# Patient Record
Sex: Male | Born: 1940 | Race: White | Hispanic: No | Marital: Married | Smoking: Former smoker
Health system: Southern US, Community
[De-identification: ages and names within clinical notes are randomized; demographics above are authoritative.]

## PROBLEM LIST (undated history)

## (undated) DIAGNOSIS — M1990 Unspecified osteoarthritis, unspecified site: Secondary | ICD-10-CM

## (undated) DIAGNOSIS — C159 Malignant neoplasm of esophagus, unspecified: Secondary | ICD-10-CM

## (undated) DIAGNOSIS — G459 Transient cerebral ischemic attack, unspecified: Secondary | ICD-10-CM

## (undated) DIAGNOSIS — I77811 Abdominal aortic ectasia: Secondary | ICD-10-CM

## (undated) DIAGNOSIS — I10 Essential (primary) hypertension: Secondary | ICD-10-CM

## (undated) DIAGNOSIS — I771 Stricture of artery: Secondary | ICD-10-CM

## (undated) DIAGNOSIS — I509 Heart failure, unspecified: Secondary | ICD-10-CM

## (undated) DIAGNOSIS — H9190 Unspecified hearing loss, unspecified ear: Secondary | ICD-10-CM

## (undated) DIAGNOSIS — I639 Cerebral infarction, unspecified: Secondary | ICD-10-CM

## (undated) DIAGNOSIS — E119 Type 2 diabetes mellitus without complications: Secondary | ICD-10-CM

## (undated) DIAGNOSIS — E039 Hypothyroidism, unspecified: Secondary | ICD-10-CM

## (undated) DIAGNOSIS — K922 Gastrointestinal hemorrhage, unspecified: Secondary | ICD-10-CM

## (undated) DIAGNOSIS — I340 Nonrheumatic mitral (valve) insufficiency: Secondary | ICD-10-CM

## (undated) DIAGNOSIS — R160 Hepatomegaly, not elsewhere classified: Secondary | ICD-10-CM

## (undated) DIAGNOSIS — D649 Anemia, unspecified: Secondary | ICD-10-CM

## (undated) DIAGNOSIS — M5134 Other intervertebral disc degeneration, thoracic region: Secondary | ICD-10-CM

## (undated) DIAGNOSIS — G4730 Sleep apnea, unspecified: Secondary | ICD-10-CM

## (undated) DIAGNOSIS — I517 Cardiomegaly: Secondary | ICD-10-CM

## (undated) HISTORY — DX: Cardiomegaly: I51.7

## (undated) HISTORY — PX: APPENDECTOMY: SHX54

## (undated) HISTORY — DX: Nonrheumatic mitral (valve) insufficiency: I34.0

## (undated) HISTORY — PX: FRACTURE SURGERY: SHX138

## (undated) HISTORY — DX: Anemia, unspecified: D64.9

## (undated) HISTORY — DX: Other intervertebral disc degeneration, thoracic region: M51.34

## (undated) HISTORY — PX: NOSE SURGERY: SHX723

## (undated) HISTORY — DX: Gastrointestinal hemorrhage, unspecified: K92.2

## (undated) HISTORY — DX: Malignant neoplasm of esophagus, unspecified: C15.9

## (undated) HISTORY — DX: Abdominal aortic ectasia: I77.811

## (undated) HISTORY — PX: EYE SURGERY: SHX253

## (undated) HISTORY — DX: Stricture of artery: I77.1

---

## 2010-03-15 ENCOUNTER — Ambulatory Visit: Payer: Self-pay | Admitting: Family Medicine

## 2011-05-02 DIAGNOSIS — E785 Hyperlipidemia, unspecified: Secondary | ICD-10-CM | POA: Diagnosis not present

## 2011-05-02 DIAGNOSIS — E079 Disorder of thyroid, unspecified: Secondary | ICD-10-CM | POA: Diagnosis not present

## 2011-05-02 DIAGNOSIS — E119 Type 2 diabetes mellitus without complications: Secondary | ICD-10-CM | POA: Diagnosis not present

## 2011-05-02 DIAGNOSIS — I10 Essential (primary) hypertension: Secondary | ICD-10-CM | POA: Diagnosis not present

## 2011-05-02 DIAGNOSIS — Z79899 Other long term (current) drug therapy: Secondary | ICD-10-CM | POA: Diagnosis not present

## 2011-05-02 DIAGNOSIS — Z23 Encounter for immunization: Secondary | ICD-10-CM | POA: Diagnosis not present

## 2011-05-16 DIAGNOSIS — Z1211 Encounter for screening for malignant neoplasm of colon: Secondary | ICD-10-CM | POA: Diagnosis not present

## 2011-06-29 DIAGNOSIS — H53469 Homonymous bilateral field defects, unspecified side: Secondary | ICD-10-CM | POA: Diagnosis not present

## 2011-06-29 DIAGNOSIS — H2510 Age-related nuclear cataract, unspecified eye: Secondary | ICD-10-CM | POA: Diagnosis not present

## 2011-06-29 DIAGNOSIS — Z961 Presence of intraocular lens: Secondary | ICD-10-CM | POA: Diagnosis not present

## 2011-06-29 DIAGNOSIS — E119 Type 2 diabetes mellitus without complications: Secondary | ICD-10-CM | POA: Diagnosis not present

## 2011-08-02 DIAGNOSIS — E039 Hypothyroidism, unspecified: Secondary | ICD-10-CM | POA: Diagnosis not present

## 2011-08-02 DIAGNOSIS — G4733 Obstructive sleep apnea (adult) (pediatric): Secondary | ICD-10-CM | POA: Diagnosis not present

## 2011-11-21 DIAGNOSIS — E039 Hypothyroidism, unspecified: Secondary | ICD-10-CM | POA: Diagnosis not present

## 2011-12-14 ENCOUNTER — Emergency Department: Payer: Self-pay | Admitting: Emergency Medicine

## 2012-02-29 DIAGNOSIS — Z23 Encounter for immunization: Secondary | ICD-10-CM | POA: Diagnosis not present

## 2012-03-25 DIAGNOSIS — E039 Hypothyroidism, unspecified: Secondary | ICD-10-CM | POA: Diagnosis not present

## 2012-03-25 DIAGNOSIS — I10 Essential (primary) hypertension: Secondary | ICD-10-CM | POA: Diagnosis not present

## 2012-04-02 DIAGNOSIS — E119 Type 2 diabetes mellitus without complications: Secondary | ICD-10-CM | POA: Diagnosis not present

## 2012-04-02 DIAGNOSIS — M79609 Pain in unspecified limb: Secondary | ICD-10-CM | POA: Diagnosis not present

## 2012-04-02 DIAGNOSIS — B351 Tinea unguium: Secondary | ICD-10-CM | POA: Diagnosis not present

## 2012-07-01 DIAGNOSIS — E119 Type 2 diabetes mellitus without complications: Secondary | ICD-10-CM | POA: Diagnosis not present

## 2012-07-31 DIAGNOSIS — Z961 Presence of intraocular lens: Secondary | ICD-10-CM | POA: Diagnosis not present

## 2012-07-31 DIAGNOSIS — H53469 Homonymous bilateral field defects, unspecified side: Secondary | ICD-10-CM | POA: Diagnosis not present

## 2012-07-31 DIAGNOSIS — H2510 Age-related nuclear cataract, unspecified eye: Secondary | ICD-10-CM | POA: Diagnosis not present

## 2012-07-31 DIAGNOSIS — E119 Type 2 diabetes mellitus without complications: Secondary | ICD-10-CM | POA: Diagnosis not present

## 2012-11-05 DIAGNOSIS — E039 Hypothyroidism, unspecified: Secondary | ICD-10-CM | POA: Diagnosis not present

## 2012-11-05 DIAGNOSIS — E119 Type 2 diabetes mellitus without complications: Secondary | ICD-10-CM | POA: Diagnosis not present

## 2012-11-12 DIAGNOSIS — E119 Type 2 diabetes mellitus without complications: Secondary | ICD-10-CM | POA: Diagnosis not present

## 2012-11-12 DIAGNOSIS — I10 Essential (primary) hypertension: Secondary | ICD-10-CM | POA: Diagnosis not present

## 2012-11-12 DIAGNOSIS — E039 Hypothyroidism, unspecified: Secondary | ICD-10-CM | POA: Diagnosis not present

## 2013-02-18 DIAGNOSIS — Z23 Encounter for immunization: Secondary | ICD-10-CM | POA: Diagnosis not present

## 2013-06-27 ENCOUNTER — Ambulatory Visit: Payer: Self-pay

## 2013-07-10 DIAGNOSIS — E039 Hypothyroidism, unspecified: Secondary | ICD-10-CM | POA: Diagnosis not present

## 2013-07-10 DIAGNOSIS — E119 Type 2 diabetes mellitus without complications: Secondary | ICD-10-CM | POA: Diagnosis not present

## 2013-07-10 DIAGNOSIS — I10 Essential (primary) hypertension: Secondary | ICD-10-CM | POA: Diagnosis not present

## 2013-07-15 DIAGNOSIS — E039 Hypothyroidism, unspecified: Secondary | ICD-10-CM | POA: Diagnosis not present

## 2013-07-15 DIAGNOSIS — I10 Essential (primary) hypertension: Secondary | ICD-10-CM | POA: Diagnosis not present

## 2013-07-15 DIAGNOSIS — E119 Type 2 diabetes mellitus without complications: Secondary | ICD-10-CM | POA: Diagnosis not present

## 2013-07-16 DIAGNOSIS — E039 Hypothyroidism, unspecified: Secondary | ICD-10-CM | POA: Diagnosis not present

## 2013-07-16 DIAGNOSIS — B9689 Other specified bacterial agents as the cause of diseases classified elsewhere: Secondary | ICD-10-CM | POA: Diagnosis not present

## 2013-07-16 DIAGNOSIS — J329 Chronic sinusitis, unspecified: Secondary | ICD-10-CM | POA: Diagnosis not present

## 2013-07-16 DIAGNOSIS — A499 Bacterial infection, unspecified: Secondary | ICD-10-CM | POA: Diagnosis not present

## 2014-01-12 DIAGNOSIS — E039 Hypothyroidism, unspecified: Secondary | ICD-10-CM | POA: Diagnosis not present

## 2014-01-12 DIAGNOSIS — E119 Type 2 diabetes mellitus without complications: Secondary | ICD-10-CM | POA: Diagnosis not present

## 2014-01-19 DIAGNOSIS — E119 Type 2 diabetes mellitus without complications: Secondary | ICD-10-CM | POA: Diagnosis not present

## 2014-01-19 DIAGNOSIS — E039 Hypothyroidism, unspecified: Secondary | ICD-10-CM | POA: Diagnosis not present

## 2014-07-24 DIAGNOSIS — I10 Essential (primary) hypertension: Secondary | ICD-10-CM | POA: Diagnosis not present

## 2014-07-24 DIAGNOSIS — E119 Type 2 diabetes mellitus without complications: Secondary | ICD-10-CM | POA: Diagnosis not present

## 2014-07-24 DIAGNOSIS — E039 Hypothyroidism, unspecified: Secondary | ICD-10-CM | POA: Diagnosis not present

## 2014-10-03 ENCOUNTER — Other Ambulatory Visit: Payer: Self-pay

## 2014-10-03 ENCOUNTER — Inpatient Hospital Stay
Admission: EM | Admit: 2014-10-03 | Discharge: 2014-10-09 | DRG: 871 | Disposition: A | Discharge status: Skilled Nursing Facility | Payer: Medicare Other | Attending: Internal Medicine | Admitting: Internal Medicine

## 2014-10-03 ENCOUNTER — Emergency Department: Payer: Medicare Other

## 2014-10-03 ENCOUNTER — Encounter: Payer: Self-pay | Admitting: Emergency Medicine

## 2014-10-03 ENCOUNTER — Inpatient Hospital Stay
Admit: 2014-10-03 | Discharge: 2014-10-03 | Disposition: A | Discharge status: Home / Self Care | Payer: Medicare Other | Attending: Internal Medicine | Admitting: Internal Medicine

## 2014-10-03 DIAGNOSIS — D72829 Elevated white blood cell count, unspecified: Secondary | ICD-10-CM | POA: Diagnosis present

## 2014-10-03 DIAGNOSIS — W1830XA Fall on same level, unspecified, initial encounter: Secondary | ICD-10-CM | POA: Diagnosis present

## 2014-10-03 DIAGNOSIS — Y939 Activity, unspecified: Secondary | ICD-10-CM

## 2014-10-03 DIAGNOSIS — A419 Sepsis, unspecified organism: Principal | ICD-10-CM

## 2014-10-03 DIAGNOSIS — T8089XA Other complications following infusion, transfusion and therapeutic injection, initial encounter: Secondary | ICD-10-CM | POA: Diagnosis present

## 2014-10-03 DIAGNOSIS — E872 Acidosis: Secondary | ICD-10-CM | POA: Diagnosis present

## 2014-10-03 DIAGNOSIS — I4510 Unspecified right bundle-branch block: Secondary | ICD-10-CM | POA: Diagnosis present

## 2014-10-03 DIAGNOSIS — J811 Chronic pulmonary edema: Secondary | ICD-10-CM | POA: Diagnosis not present

## 2014-10-03 DIAGNOSIS — Z801 Family history of malignant neoplasm of trachea, bronchus and lung: Secondary | ICD-10-CM | POA: Diagnosis not present

## 2014-10-03 DIAGNOSIS — G4730 Sleep apnea, unspecified: Secondary | ICD-10-CM | POA: Diagnosis present

## 2014-10-03 DIAGNOSIS — R0602 Shortness of breath: Secondary | ICD-10-CM

## 2014-10-03 DIAGNOSIS — H9192 Unspecified hearing loss, left ear: Secondary | ICD-10-CM | POA: Diagnosis present

## 2014-10-03 DIAGNOSIS — K922 Gastrointestinal hemorrhage, unspecified: Secondary | ICD-10-CM | POA: Diagnosis present

## 2014-10-03 DIAGNOSIS — R195 Other fecal abnormalities: Secondary | ICD-10-CM | POA: Diagnosis not present

## 2014-10-03 DIAGNOSIS — I509 Heart failure, unspecified: Secondary | ICD-10-CM

## 2014-10-03 DIAGNOSIS — R7989 Other specified abnormal findings of blood chemistry: Secondary | ICD-10-CM | POA: Diagnosis present

## 2014-10-03 DIAGNOSIS — I5033 Acute on chronic diastolic (congestive) heart failure: Secondary | ICD-10-CM | POA: Diagnosis present

## 2014-10-03 DIAGNOSIS — M1990 Unspecified osteoarthritis, unspecified site: Secondary | ICD-10-CM | POA: Diagnosis present

## 2014-10-03 DIAGNOSIS — Y999 Unspecified external cause status: Secondary | ICD-10-CM

## 2014-10-03 DIAGNOSIS — R0600 Dyspnea, unspecified: Secondary | ICD-10-CM

## 2014-10-03 DIAGNOSIS — J189 Pneumonia, unspecified organism: Secondary | ICD-10-CM | POA: Diagnosis not present

## 2014-10-03 DIAGNOSIS — R6520 Severe sepsis without septic shock: Secondary | ICD-10-CM | POA: Diagnosis present

## 2014-10-03 DIAGNOSIS — D62 Acute posthemorrhagic anemia: Secondary | ICD-10-CM | POA: Diagnosis present

## 2014-10-03 DIAGNOSIS — Z8673 Personal history of transient ischemic attack (TIA), and cerebral infarction without residual deficits: Secondary | ICD-10-CM | POA: Diagnosis not present

## 2014-10-03 DIAGNOSIS — J810 Acute pulmonary edema: Secondary | ICD-10-CM | POA: Diagnosis not present

## 2014-10-03 DIAGNOSIS — E039 Hypothyroidism, unspecified: Secondary | ICD-10-CM | POA: Diagnosis present

## 2014-10-03 DIAGNOSIS — R778 Other specified abnormalities of plasma proteins: Secondary | ICD-10-CM | POA: Diagnosis present

## 2014-10-03 DIAGNOSIS — R4182 Altered mental status, unspecified: Secondary | ICD-10-CM

## 2014-10-03 DIAGNOSIS — Z87891 Personal history of nicotine dependence: Secondary | ICD-10-CM

## 2014-10-03 DIAGNOSIS — G9341 Metabolic encephalopathy: Secondary | ICD-10-CM | POA: Diagnosis present

## 2014-10-03 DIAGNOSIS — I255 Ischemic cardiomyopathy: Secondary | ICD-10-CM | POA: Diagnosis present

## 2014-10-03 DIAGNOSIS — N390 Urinary tract infection, site not specified: Secondary | ICD-10-CM | POA: Diagnosis not present

## 2014-10-03 DIAGNOSIS — R262 Difficulty in walking, not elsewhere classified: Secondary | ICD-10-CM | POA: Diagnosis not present

## 2014-10-03 DIAGNOSIS — E785 Hyperlipidemia, unspecified: Secondary | ICD-10-CM | POA: Diagnosis present

## 2014-10-03 DIAGNOSIS — Z882 Allergy status to sulfonamides status: Secondary | ICD-10-CM | POA: Diagnosis not present

## 2014-10-03 DIAGNOSIS — Z8249 Family history of ischemic heart disease and other diseases of the circulatory system: Secondary | ICD-10-CM

## 2014-10-03 DIAGNOSIS — I10 Essential (primary) hypertension: Secondary | ICD-10-CM | POA: Diagnosis present

## 2014-10-03 DIAGNOSIS — R488 Other symbolic dysfunctions: Secondary | ICD-10-CM | POA: Diagnosis not present

## 2014-10-03 DIAGNOSIS — J9601 Acute respiratory failure with hypoxia: Secondary | ICD-10-CM | POA: Diagnosis present

## 2014-10-03 DIAGNOSIS — E119 Type 2 diabetes mellitus without complications: Secondary | ICD-10-CM | POA: Diagnosis present

## 2014-10-03 DIAGNOSIS — Z881 Allergy status to other antibiotic agents status: Secondary | ICD-10-CM | POA: Diagnosis not present

## 2014-10-03 DIAGNOSIS — M6281 Muscle weakness (generalized): Secondary | ICD-10-CM | POA: Diagnosis not present

## 2014-10-03 DIAGNOSIS — I214 Non-ST elevation (NSTEMI) myocardial infarction: Secondary | ICD-10-CM | POA: Diagnosis present

## 2014-10-03 DIAGNOSIS — K2970 Gastritis, unspecified, without bleeding: Secondary | ICD-10-CM | POA: Diagnosis not present

## 2014-10-03 DIAGNOSIS — R1310 Dysphagia, unspecified: Secondary | ICD-10-CM | POA: Diagnosis not present

## 2014-10-03 DIAGNOSIS — J181 Lobar pneumonia, unspecified organism: Secondary | ICD-10-CM | POA: Diagnosis not present

## 2014-10-03 DIAGNOSIS — S0990XA Unspecified injury of head, initial encounter: Secondary | ICD-10-CM | POA: Diagnosis present

## 2014-10-03 DIAGNOSIS — R410 Disorientation, unspecified: Secondary | ICD-10-CM | POA: Diagnosis not present

## 2014-10-03 DIAGNOSIS — D649 Anemia, unspecified: Secondary | ICD-10-CM | POA: Insufficient documentation

## 2014-10-03 DIAGNOSIS — Y9289 Other specified places as the place of occurrence of the external cause: Secondary | ICD-10-CM

## 2014-10-03 DIAGNOSIS — K2971 Gastritis, unspecified, with bleeding: Secondary | ICD-10-CM | POA: Diagnosis not present

## 2014-10-03 DIAGNOSIS — R918 Other nonspecific abnormal finding of lung field: Secondary | ICD-10-CM | POA: Diagnosis not present

## 2014-10-03 HISTORY — DX: Type 2 diabetes mellitus without complications: E11.9

## 2014-10-03 HISTORY — DX: Transient cerebral ischemic attack, unspecified: G45.9

## 2014-10-03 HISTORY — DX: Sleep apnea, unspecified: G47.30

## 2014-10-03 HISTORY — DX: Unspecified osteoarthritis, unspecified site: M19.90

## 2014-10-03 HISTORY — DX: Essential (primary) hypertension: I10

## 2014-10-03 HISTORY — DX: Unspecified hearing loss, unspecified ear: H91.90

## 2014-10-03 HISTORY — DX: Hypothyroidism, unspecified: E03.9

## 2014-10-03 HISTORY — DX: Cerebral infarction, unspecified: I63.9

## 2014-10-03 LAB — CBC
HCT: 30.7 % — ABNORMAL LOW (ref 40.0–52.0)
HEMATOCRIT: 19.6 % — AB (ref 40.0–52.0)
HEMATOCRIT: 29.2 % — AB (ref 40.0–52.0)
HEMOGLOBIN: 5.4 g/dL — AB (ref 13.0–18.0)
HEMOGLOBIN: 8.7 g/dL — AB (ref 13.0–18.0)
Hemoglobin: 8.3 g/dL — ABNORMAL LOW (ref 13.0–18.0)
MCH: 16.1 pg — ABNORMAL LOW (ref 26.0–34.0)
MCH: 18.3 pg — ABNORMAL LOW (ref 26.0–34.0)
MCH: 18 pg — ABNORMAL LOW (ref 26.0–34.0)
MCHC: 27.5 g/dL — AB (ref 32.0–36.0)
MCHC: 28.4 g/dL — ABNORMAL LOW (ref 32.0–36.0)
MCHC: 28.3 g/dL — ABNORMAL LOW (ref 32.0–36.0)
MCV: 58.6 fL — ABNORMAL LOW (ref 80.0–100.0)
MCV: 64.5 fL — ABNORMAL LOW (ref 80.0–100.0)
MCV: 63.6 fL — AB (ref 80.0–100.0)
PLATELETS: 459 10*3/uL — AB (ref 150–440)
Platelets: 512 10*3/uL — ABNORMAL HIGH (ref 150–440)
Platelets: 467 10*3/uL — ABNORMAL HIGH (ref 150–440)
RBC: 3.34 MIL/uL — AB (ref 4.40–5.90)
RBC: 4.77 MIL/uL (ref 4.40–5.90)
RBC: 4.59 MIL/uL (ref 4.40–5.90)
RDW: 23.2 % — ABNORMAL HIGH (ref 11.5–14.5)
RDW: 27.4 % — ABNORMAL HIGH (ref 11.5–14.5)
RDW: 26.9 % — ABNORMAL HIGH (ref 11.5–14.5)
WBC: 21.6 10*3/uL — ABNORMAL HIGH (ref 3.8–10.6)
WBC: 31.9 10*3/uL — ABNORMAL HIGH (ref 3.8–10.6)
WBC: 34.5 10*3/uL — AB (ref 3.8–10.6)

## 2014-10-03 LAB — BASIC METABOLIC PANEL
ANION GAP: 16 — AB (ref 5–15)
BUN: 17 mg/dL (ref 6–20)
CALCIUM: 8 mg/dL — AB (ref 8.9–10.3)
CO2: 20 mmol/L — ABNORMAL LOW (ref 22–32)
Chloride: 101 mmol/L (ref 101–111)
Creatinine, Ser: 1.18 mg/dL (ref 0.61–1.24)
GFR, EST NON AFRICAN AMERICAN: 59 mL/min — AB (ref >60)
Glucose, Bld: 157 mg/dL — ABNORMAL HIGH (ref 65–99)
POTASSIUM: 3.9 mmol/L (ref 3.5–5.1)
SODIUM: 137 mmol/L (ref 135–145)

## 2014-10-03 LAB — GLUCOSE, CAPILLARY
GLUCOSE-CAPILLARY: 156 mg/dL — AB (ref 65–99)
GLUCOSE-CAPILLARY: 173 mg/dL — AB (ref 65–99)
GLUCOSE-CAPILLARY: 222 mg/dL — AB (ref 65–99)
Glucose-Capillary: 157 mg/dL — ABNORMAL HIGH (ref 65–99)

## 2014-10-03 LAB — ABO/RH: ABO/RH(D): A POS

## 2014-10-03 LAB — COMPREHENSIVE METABOLIC PANEL
ALBUMIN: 4.1 g/dL (ref 3.5–5.0)
ALT: 16 U/L — ABNORMAL LOW (ref 17–63)
ANION GAP: 15 (ref 5–15)
AST: 66 U/L — ABNORMAL HIGH (ref 15–41)
Alkaline Phosphatase: 44 U/L (ref 38–126)
BUN: 18 mg/dL (ref 6–20)
CALCIUM: 8.7 mg/dL — AB (ref 8.9–10.3)
CO2: 18 mmol/L — AB (ref 22–32)
Chloride: 102 mmol/L (ref 101–111)
Creatinine, Ser: 0.98 mg/dL (ref 0.61–1.24)
GFR calc non Af Amer: >60 mL/min (ref >60)
GLUCOSE: 179 mg/dL — AB (ref 65–99)
POTASSIUM: 4.6 mmol/L (ref 3.5–5.1)
SODIUM: 135 mmol/L (ref 135–145)
TOTAL PROTEIN: 7.6 g/dL (ref 6.5–8.1)
Total Bilirubin: 0.2 mg/dL — ABNORMAL LOW (ref 0.3–1.2)

## 2014-10-03 LAB — URINALYSIS COMPLETE WITH MICROSCOPIC (ARMC ONLY)
BACTERIA UA: NONE SEEN
BILIRUBIN URINE: NEGATIVE
GLUCOSE, UA: NEGATIVE mg/dL
HGB URINE DIPSTICK: NEGATIVE
LEUKOCYTES UA: NEGATIVE
NITRITE: NEGATIVE
Protein, ur: NEGATIVE mg/dL
SPECIFIC GRAVITY, URINE: 1.017 (ref 1.005–1.030)
pH: 5 (ref 5.0–8.0)

## 2014-10-03 LAB — MRSA PCR SCREENING: MRSA by PCR: NEGATIVE

## 2014-10-03 LAB — PREPARE RBC (CROSSMATCH)

## 2014-10-03 LAB — PROTIME-INR
INR: 1.18
PROTHROMBIN TIME: 15.2 s — AB (ref 11.4–15.0)

## 2014-10-03 LAB — TROPONIN I
TROPONIN I: 1.32 ng/mL — AB (ref <0.031)
TROPONIN I: 41.28 ng/mL — AB (ref <0.031)
Troponin I: 7.83 ng/mL — ABNORMAL HIGH (ref <0.031)

## 2014-10-03 MED ORDER — SODIUM CHLORIDE 0.9 % IV SOLN: 10.0000 mL/h | Freq: Once | INTRAVENOUS | Status: DC

## 2014-10-03 MED ORDER — NITROGLYCERIN 2 % TD OINT
1.0000 [in_us] | TOPICAL_OINTMENT | Freq: Four times a day (QID) | TRANSDERMAL | Status: DC
  Administered 2014-10-03 – 2014-10-08 (×20): 1 [in_us] via TOPICAL
  Filled 2014-10-03 (×20): qty 1

## 2014-10-03 MED ORDER — MAGNESIUM OXIDE 400 (241.3 MG) MG PO TABS
400.0000 mg | ORAL_TABLET | Freq: Every day | ORAL | Status: DC
  Administered 2014-10-03 – 2014-10-08 (×6): 400 mg via ORAL
  Filled 2014-10-03 (×6): qty 1

## 2014-10-03 MED ORDER — VANCOMYCIN HCL 10 G IV SOLR
1250.0000 mg | INTRAVENOUS | Status: DC
  Administered 2014-10-04 – 2014-10-06 (×4): 1250 mg via INTRAVENOUS
  Filled 2014-10-03 (×5): qty 1250

## 2014-10-03 MED ORDER — VANCOMYCIN HCL 10 G IV SOLR
1250.0000 mg | INTRAVENOUS | Status: AC
  Administered 2014-10-03: 1250 mg via INTRAVENOUS
  Filled 2014-10-03: qty 1250

## 2014-10-03 MED ORDER — DEXTROSE 5 % IV SOLN
500.0000 mg | INTRAVENOUS | Status: DC
  Administered 2014-10-04 – 2014-10-07 (×4): 500 mg via INTRAVENOUS
  Filled 2014-10-03 (×5): qty 500

## 2014-10-03 MED ORDER — DEXTROSE 5 % IV SOLN: 250.0000 mg | INTRAVENOUS | Status: DC

## 2014-10-03 MED ORDER — ATORVASTATIN CALCIUM 20 MG PO TABS
20.0000 mg | ORAL_TABLET | Freq: Every day | ORAL | Status: DC
  Administered 2014-10-04: 20 mg via ORAL
  Filled 2014-10-03 (×2): qty 1

## 2014-10-03 MED ORDER — MULTI FOR HIM 50+ PO TABS: ORAL_TABLET | Freq: Every day | ORAL | Status: DC

## 2014-10-03 MED ORDER — SODIUM CHLORIDE 0.9 % IV SOLN
1.0000 g | Freq: Three times a day (TID) | INTRAVENOUS | Status: DC
  Administered 2014-10-03 – 2014-10-07 (×11): 1 g via INTRAVENOUS
  Filled 2014-10-03 (×15): qty 1

## 2014-10-03 MED ORDER — FUROSEMIDE 10 MG/ML IJ SOLN
40.0000 mg | Freq: Once | INTRAMUSCULAR | Status: AC
  Administered 2014-10-03: 40 mg via INTRAVENOUS

## 2014-10-03 MED ORDER — PIPERACILLIN-TAZOBACTAM 3.375 G IVPB 30 MIN: 3.3750 g | Freq: Four times a day (QID) | INTRAVENOUS | Status: DC

## 2014-10-03 MED ORDER — CETYLPYRIDINIUM CHLORIDE 0.05 % MT LIQD
7.0000 mL | Freq: Two times a day (BID) | OROMUCOSAL | Status: DC
  Administered 2014-10-03 – 2014-10-08 (×8): 7 mL via OROMUCOSAL

## 2014-10-03 MED ORDER — SODIUM CHLORIDE 0.9 % IV BOLUS (SEPSIS): 1000.0000 mL | Freq: Once | INTRAVENOUS | Status: DC

## 2014-10-03 MED ORDER — NITROGLYCERIN 0.4 MG SL SUBL
SUBLINGUAL_TABLET | SUBLINGUAL | Status: AC
  Filled 2014-10-03: qty 1

## 2014-10-03 MED ORDER — MAGNESIUM OXIDE 400 MG PO TABS: 400.0000 mg | ORAL_TABLET | Freq: Every day | ORAL | Status: DC

## 2014-10-03 MED ORDER — LEVOFLOXACIN IN D5W 750 MG/150ML IV SOLN
750.0000 mg | INTRAVENOUS | Status: DC
  Filled 2014-10-03: qty 150

## 2014-10-03 MED ORDER — ACETAMINOPHEN 325 MG PO TABS
650.0000 mg | ORAL_TABLET | Freq: Four times a day (QID) | ORAL | Status: DC | PRN
  Filled 2014-10-03: qty 2

## 2014-10-03 MED ORDER — FUROSEMIDE 10 MG/ML IJ SOLN
INTRAMUSCULAR | Status: AC
  Filled 2014-10-03: qty 4

## 2014-10-03 MED ORDER — SODIUM CHLORIDE 0.9 % IV BOLUS (SEPSIS)
1000.0000 mL | Freq: Once | INTRAVENOUS | Status: AC
  Administered 2014-10-03: 1000 mL via INTRAVENOUS

## 2014-10-03 MED ORDER — ONDANSETRON HCL 4 MG PO TABS: 4.0000 mg | ORAL_TABLET | Freq: Four times a day (QID) | ORAL | Status: DC | PRN

## 2014-10-03 MED ORDER — SODIUM CHLORIDE 0.9 % IV SOLN: INTRAVENOUS | Status: DC

## 2014-10-03 MED ORDER — LEVOTHYROXINE SODIUM 100 MCG PO TABS: 100.0000 ug | ORAL_TABLET | Freq: Every day | ORAL | Status: DC

## 2014-10-03 MED ORDER — FUROSEMIDE 10 MG/ML IJ SOLN
40.0000 mg | Freq: Once | INTRAMUSCULAR | Status: AC
  Administered 2014-10-03: 40 mg via INTRAVENOUS
  Filled 2014-10-03: qty 4

## 2014-10-03 MED ORDER — LEVOFLOXACIN IN D5W 750 MG/150ML IV SOLN
750.0000 mg | Freq: Once | INTRAVENOUS | Status: AC
  Administered 2014-10-03: 750 mg via INTRAVENOUS
  Filled 2014-10-03: qty 150

## 2014-10-03 MED ORDER — INSULIN ASPART 100 UNIT/ML ~~LOC~~ SOLN
0.0000 [IU] | SUBCUTANEOUS | Status: DC
  Administered 2014-10-03: 7 [IU] via SUBCUTANEOUS
  Administered 2014-10-03 (×2): 2 [IU] via SUBCUTANEOUS
  Administered 2014-10-03: 3 [IU] via SUBCUTANEOUS
  Administered 2014-10-04: 1 [IU] via SUBCUTANEOUS
  Administered 2014-10-04 (×3): 2 [IU] via SUBCUTANEOUS
  Administered 2014-10-04 (×2): 1 [IU] via SUBCUTANEOUS
  Administered 2014-10-05 (×4): 2 [IU] via SUBCUTANEOUS
  Administered 2014-10-05: 1 [IU] via SUBCUTANEOUS
  Administered 2014-10-05 – 2014-10-06 (×2): 2 [IU] via SUBCUTANEOUS
  Administered 2014-10-06 (×3): 1 [IU] via SUBCUTANEOUS
  Administered 2014-10-06: 5 [IU] via SUBCUTANEOUS
  Administered 2014-10-06 – 2014-10-07 (×3): 2 [IU] via SUBCUTANEOUS
  Administered 2014-10-07 – 2014-10-08 (×3): 1 [IU] via SUBCUTANEOUS
  Administered 2014-10-08: 12:00:00 3 [IU] via SUBCUTANEOUS
  Administered 2014-10-08: 2 [IU] via SUBCUTANEOUS
  Administered 2014-10-08: 3 [IU] via SUBCUTANEOUS
  Administered 2014-10-09 (×2): 1 [IU] via SUBCUTANEOUS
  Filled 2014-10-03 (×2): qty 1
  Filled 2014-10-03: qty 2
  Filled 2014-10-03: qty 1
  Filled 2014-10-03: qty 2
  Filled 2014-10-03: qty 3
  Filled 2014-10-03: qty 2
  Filled 2014-10-03: qty 3
  Filled 2014-10-03: qty 1
  Filled 2014-10-03: qty 2
  Filled 2014-10-03 (×2): qty 1
  Filled 2014-10-03 (×3): qty 2
  Filled 2014-10-03: qty 3
  Filled 2014-10-03: qty 9
  Filled 2014-10-03: qty 2
  Filled 2014-10-03: qty 5
  Filled 2014-10-03: qty 2
  Filled 2014-10-03 (×2): qty 1
  Filled 2014-10-03 (×3): qty 2
  Filled 2014-10-03: qty 5
  Filled 2014-10-03: qty 1
  Filled 2014-10-03: qty 2
  Filled 2014-10-03 (×2): qty 1
  Filled 2014-10-03: qty 3
  Filled 2014-10-03: qty 1
  Filled 2014-10-03: qty 2
  Filled 2014-10-03: qty 1

## 2014-10-03 MED ORDER — NITROGLYCERIN 0.4 MG SL SUBL: 0.4000 mg | SUBLINGUAL_TABLET | SUBLINGUAL | Status: DC | PRN

## 2014-10-03 MED ORDER — FUROSEMIDE 10 MG/ML IJ SOLN
40.0000 mg | Freq: Two times a day (BID) | INTRAMUSCULAR | Status: DC
  Administered 2014-10-03 – 2014-10-08 (×12): 40 mg via INTRAVENOUS
  Filled 2014-10-03 (×12): qty 4

## 2014-10-03 MED ORDER — ADULT MULTIVITAMIN W/MINERALS CH
1.0000 | ORAL_TABLET | Freq: Every day | ORAL | Status: DC
  Administered 2014-10-04 – 2014-10-08 (×5): 1 via ORAL
  Filled 2014-10-03 (×6): qty 1

## 2014-10-03 MED ORDER — LORAZEPAM 2 MG/ML IJ SOLN
INTRAMUSCULAR | Status: AC
Start: 2014-10-03 — End: 2014-10-03
  Administered 2014-10-03: 2 mg
  Filled 2014-10-03: qty 1

## 2014-10-03 MED ORDER — SODIUM CHLORIDE 0.9 % IJ SOLN
3.0000 mL | Freq: Two times a day (BID) | INTRAMUSCULAR | Status: DC
  Administered 2014-10-03 – 2014-10-09 (×10): 3 mL via INTRAVENOUS

## 2014-10-03 MED ORDER — OMEGA-3-ACID ETHYL ESTERS 1 G PO CAPS: 1.0000 g | ORAL_CAPSULE | Freq: Every day | ORAL | Status: DC

## 2014-10-03 MED ORDER — CHLORHEXIDINE GLUCONATE 0.12 % MT SOLN: 15.0000 mL | Freq: Two times a day (BID) | OROMUCOSAL | Status: DC

## 2014-10-03 MED ORDER — METOPROLOL TARTRATE 25 MG PO TABS
12.5000 mg | ORAL_TABLET | Freq: Two times a day (BID) | ORAL | Status: DC
  Administered 2014-10-03 – 2014-10-08 (×10): 12.5 mg via ORAL
  Filled 2014-10-03 (×12): qty 1

## 2014-10-03 MED ORDER — OMEGA-3-ACID ETHYL ESTERS 1 G PO CAPS
1.0000 g | ORAL_CAPSULE | Freq: Every day | ORAL | Status: DC
  Administered 2014-10-04 – 2014-10-08 (×5): 1 g via ORAL
  Filled 2014-10-03 (×6): qty 1

## 2014-10-03 MED ORDER — ACETAMINOPHEN 650 MG RE SUPP: 650.0000 mg | Freq: Four times a day (QID) | RECTAL | Status: DC | PRN

## 2014-10-03 MED ORDER — PANTOPRAZOLE SODIUM 40 MG IV SOLR
40.0000 mg | Freq: Two times a day (BID) | INTRAVENOUS | Status: DC
  Administered 2014-10-03 – 2014-10-07 (×9): 40 mg via INTRAVENOUS
  Filled 2014-10-03 (×9): qty 40

## 2014-10-03 MED ORDER — SODIUM BICARBONATE 8.4 % IV SOLN
INTRAVENOUS | Status: DC
  Administered 2014-10-03: 09:00:00 via INTRAVENOUS
  Filled 2014-10-03 (×2): qty 150

## 2014-10-03 MED ORDER — ONDANSETRON HCL 4 MG/2ML IJ SOLN: 4.0000 mg | Freq: Four times a day (QID) | INTRAMUSCULAR | Status: DC | PRN

## 2014-10-03 NOTE — Progress Notes (Signed)
3rd troponin 41.2- notified Dr Saralyn Pilar and Dr Paulene Floor, no new orders recieved

## 2014-10-03 NOTE — ED Provider Notes (Signed)
[facility] Emergency Department Provider Note  ____________________________________________  Time seen: Approximately 4:14 AM  I have reviewed the triage vital signs and the nursing notes.   HISTORY  Chief Complaint Altered Mental Status; Urinary Frequency; and Head Injury  History obtained per spouse  HPI [name] is a 74 y.o. male who presents to the ED from home brought by wife for confusion, urinary symptoms and labored breathing. Spouse reports patient had an "spell" yesterday of confusion and she attributed it to low blood sugar and gave patient a snack without checking blood sugar. She has noticed since 10 PM yesterday evening, patient going to the restroom to urinate 5-6 times, which is unusual for him. Approximately 1 AM wife heard a noise and found patient on the bathroom floor with his pants around his ankles. Patient struck the vertex of head without loss of consciousness. Patient denies all complaints. Specifically denies fever, chills, chest pain, shortness of breath, abdominal pain, nausea, vomiting, diarrhea. Denies use of anticoagulants. Denies bloody stools.   Past Medical History  Diagnosis Date  . Diabetes mellitus without complication   . Hypertension   . Hearing loss     left ear  . TIA (transient ischemic attack)     There are no active problems to display for this patient.   Past Surgical History  Procedure Laterality Date  . Nose surgery    . Appendectomy      Current Outpatient Rx  Name  Route  Sig  Dispense  Refill  . Cholecalciferol (VITAMIN D3) 1000 UNITS CAPS   Oral   Take 2,000 Units by mouth.         . levothyroxine (SYNTHROID, LEVOTHROID) 100 MCG tablet   Oral   Take 100 mcg by mouth every morning.         . magnesium oxide (MAG-OX) 400 MG tablet   Oral   Take 400 mg by mouth at bedtime.         . metFORMIN (GLUCOPHAGE) 500 MG tablet   Oral   Take 500 mg by mouth daily with breakfast.          . metFORMIN (GLUCOPHAGE) 500 MG tablet   Oral   Take 1,500 mg by mouth daily with supper.         . metoprolol tartrate (LOPRESSOR) 25 MG tablet   Oral   Take 12.5 mg by mouth 2 (two) times daily.          . Multiple Vitamins-Minerals (MULTI FOR HIM 50+ PO)   Oral   Take 1 tablet by mouth daily.         . Omega-3 Fatty Acids (FISH OIL) 1000 MG CAPS   Oral   Take 1 capsule by mouth daily.         . Red Yeast Rice 600 MG TABS   Oral   Take 2 tablets by mouth daily.         . metFORMIN (GLUCOPHAGE) 500 MG tablet   Oral   Take 4 tablets by mouth daily.           Allergies Sulfa antibiotics; Amoxicillin; Ampicillin; Keflet; Lisinopril; Losartan; Sulfamethoxazole-trimethoprim; and Cephalosporins  History reviewed. No pertinent family history.  Social History Social History  Substance Use Topics  . Smoking status: Former Smoker    Quit date: 10/03/1983  . Smokeless tobacco: Never Used  . Alcohol Use: Yes     Comment: rarely    Review of Systems Constitutional: No fever/chills  Eyes: No visual changes. ENT: No sore throat. Cardiovascular: Denies chest pain. Respiratory: Denies shortness of breath. Gastrointestinal: No abdominal pain.  No nausea, no vomiting.  No diarrhea.  No constipation. Genitourinary: Positive for urinary frequency. Negative for dysuria. Musculoskeletal: Negative for back pain. Skin: Negative for rash. Neurological: Positive for confusion. Negative for headaches, focal weakness or numbness.  10-point ROS otherwise negative.  ____________________________________________   PHYSICAL EXAM:  VITAL SIGNS: ED Triage Vitals  Enc Vitals Group     BP 10/03/14 0317 137/48 mmHg     Pulse Rate 10/03/14 0317 81     Resp 10/03/14 0317 20     Temp 10/03/14 0317 99.1 F (37.3 C)     Temp Source 10/03/14 0317 Oral     SpO2 10/03/14 0317 94 %     Weight 10/03/14 0317 162 lb (73.483 kg)     Height 10/03/14 0317 5\' 8"  (1.727 m)     Head Cir  --      Peak Flow --      Pain Score 10/03/14 0319 0     Pain Loc --      Pain Edu? --      Excl. in [place]? --     Constitutional: Alert and oriented. Ill appearing and in mild acute distress. Eyes: Conjunctivae are pale. PERRL. EOMI. Head: Atraumatic. Nose: No congestion/rhinnorhea. Mouth/Throat: Mucous membranes are moist.  Oropharynx non-erythematous. Neck: No stridor.   Cardiovascular: Normal rate, regular rhythm. Grossly normal heart sounds.  Good peripheral circulation. Respiratory: Increased respiratory effort.  No retractions. Lungs CTAB. Gastrointestinal: Soft and nontender. No distention. No abdominal bruits. No CVA tenderness. Musculoskeletal: No lower extremity tenderness nor edema.  No joint effusions. Neurologic:  Normal speech and language. No gross focal neurologic deficits are appreciated. No gait instability. Skin:  Skin is pale, warm, dry and intact. No rash noted. Psychiatric: Mood and affect are normal. Speech and behavior are normal.  ____________________________________________   LABS (all labs ordered are listed, but only abnormal results are displayed)  Labs Reviewed  COMPREHENSIVE METABOLIC PANEL - Abnormal; Notable for the following:    CO2 18 (*)    Glucose, Bld 179 (*)    Calcium 8.7 (*)    AST 66 (*)    ALT 16 (*)    Total Bilirubin 0.2 (*)    All other components within normal limits  CBC - Abnormal; Notable for the following:    WBC 21.6 (*)    RBC 3.34 (*)    Hemoglobin 5.4 (*)    HCT 19.6 (*)    MCV 58.6 (*)    MCH 16.1 (*)    MCHC 27.5 (*)    RDW 23.2 (*)    Platelets 512 (*)    All other components within normal limits  TROPONIN I - Abnormal; Notable for the following:    Troponin I 1.32 (*)    All other components within normal limits  PROTIME-INR - Abnormal; Notable for the following:    Prothrombin Time 15.2 (*)    All other components within normal limits  URINE CULTURE  CULTURE, BLOOD (ROUTINE X 2)  CULTURE, BLOOD (ROUTINE X  2)  URINALYSIS COMPLETEWITH MICROSCOPIC ([facility] ONLY)  TYPE AND SCREEN  PREPARE RBC (CROSSMATCH)  ABO/RH   ____________________________________________  EKG  ED ECG REPORT I, Katharine Rochefort J, the attending physician, personally viewed and interpreted this ECG.   Date: 10/03/2014  EKG Time: 0323  Rate: 81  Rhythm: normal EKG, normal sinus rhythm  Axis:  Normal  Intervals:right bundle branch block  ST&T Change: Nonspecific  ____________________________________________  RADIOLOGY  CT head without contrast interpreted per Dr. Pascal Lux: 1. Negative for intracranial injury or fracture. 2. 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  Portable chest x-ray (viewed by me, interpreted per Dr. Pascal Lux): 1. Mild pulmonary edema. 2. Focal opacity in the left lung, pneumonia versus asymmetric alveolar edema.  ____________________________________________   PROCEDURES  Procedure(s) performed:   Rectal exam: External exam within normal limits. Brown stool obtained on gloved finger which is immediately heme positive.   Critical Care performed: Yes, see critical care note(s)  CRITICAL CARE Performed by: Paulette Blanch   Total critical care time: 30 minutes  Crtical care time was exclusive of separately billable procedures and treating other patients.  Critical care was necessary to treat or prevent imminent or life-threatening deterioration.  Critical care was time spent personally by me on the following activities: development of treatment plan with patient and/or surrogate as well as nursing, discussions with consultants, evaluation of patient's response to treatment, examination of patient, obtaining history from patient or surrogate, ordering and performing treatments and interventions, ordering and review of laboratory studies, ordering and review of radiographic studies, pulse oximetry and re-evaluation of patient's  condition. ____________________________________________   INITIAL IMPRESSION / ASSESSMENT AND PLAN / ED COURSE  Pertinent labs & imaging results that were available during my care of the patient were reviewed by me and considered in my medical decision making (see chart for details).  74 year old male brought by spouse for altered mental status, urinary frequency and labored breathing. Labs notable for leukocytosis and anemia requiring blood transfusion with positive guaiac exam. Suspect lower GI bleed. Discussed with patient and spouse and obtained consent for blood transfusion. Plan to transfuse 2 units of packed red cells  ----------------------------------------- 5:05 AM on 10/03/2014 -----------------------------------------  Will initiate levaquin for CAP. Discussed case with Dr. Reece Levy who will evaluate patient for hospital admission. ____________________________________________   FINAL CLINICAL IMPRESSION(S) / ED DIAGNOSES  Final diagnoses:  Sepsis, due to unspecified organism  Anemia requiring transfusions  Gastrointestinal hemorrhage, unspecified gastritis, unspecified gastrointestinal hemorrhage type  Altered mental status, unspecified altered mental status type  Community acquired pneumonia  Elevated troponin      Paulette Blanch, MD 10/03/14 [phone]

## 2014-10-03 NOTE — ED Notes (Signed)
PT reports coughing up blood.  MD to be notified

## 2014-10-03 NOTE — ED Notes (Signed)
Pt had an episode of pink "frothy" fluid coming from nose, bipap removed and cleaned, Dr Mamie Nick notified and order obtained.

## 2014-10-03 NOTE — ED Notes (Signed)
Bipap applied

## 2014-10-03 NOTE — H&P (Addendum)
[place] [facility] [place] NAME: [name]    MR#:  [id number]  DATE OF BIRTH:  [birth year]  DATE OF ADMISSION:  10/03/2014  PRIMARY CARE PHYSICIAN: Bobetta Lime, MD   REQUESTING/REFERRING PHYSICIAN: Dr. Beather Arbour  CHIEF COMPLAINT:   Chief Complaint  Patient presents with  . Altered Mental Status  . Urinary Frequency  . Head Injury    HISTORY OF PRESENT ILLNESS:  [name]  is a 74 y.o. male with a known history of diabetes mellitus type 2, hypertension, hyperlipidemia, hypothyroidism, history of CVA, sleep apnea brought in by his wife with the complaints of noticed going to the bathroom for urination multiple times and she heard a loud noise and patient was noted to be on the floor with head hitting the wall. His wife states he was alert awake but was confused and little bit and did not lose any consciousness. He was also noted to have mild shortness of breath at that time. No history of any fever, chills, chest pain, nausea, vomiting, diarrhea, abdominal pain, dysuria. No history of hematemesis or melena. Does complain of some cough ongoing for the past few days. Evaluation the ED revealed stable stable vital signs except for mild hypoxia with room air O2 saturations of 89% and workup revealed WBC count of 21.6, H&H 5.4/19.6, troponin 1.32. Chest x-ray mild pulmonary edema, focal opacity left lung, pneumonia versus asymmetric alveolar edema. CT head negative for any acute intracranial pathology/injury. EKG normal sinus rhythm with ventricular rate of 81 bpm, right bundle branch block. Patient was alert awake and oriented and consented for PRBC transfusions which is pending at this time. After obtaining blood cultures patient was started on IV antibiotics-levofloxacin. Hospitalist service was consulted for further management. At the current time patient is comfortably resting in the bed, states feeling better, denies any complaints such as chest  pain, nausea, vomiting, abdominal pain. Does complain of cough. Patient received 40 mg Lasix IV in the ED.  PAST MEDICAL HISTORY:   Past Medical History  Diagnosis Date  . Diabetes mellitus without complication   . Hypertension   . Hearing loss     left ear  . TIA (transient ischemic attack)   . Hypothyroidism   . Sleep apnea   . Stroke   . Arthritis     PAST SURGICAL HISTORY:   Past Surgical History  Procedure Laterality Date  . Nose surgery    . Appendectomy    . Eye surgery    . Fracture surgery      SOCIAL HISTORY:   Social History  Substance Use Topics  . Smoking status: Former Smoker    Quit date: 10/03/1983  . Smokeless tobacco: Never Used  . Alcohol Use: Yes     Comment: rarely    FAMILY HISTORY:   Family History  Problem Relation Age of Onset  . Hypertension Mother   . Lung cancer Father   . Hypertension Brother     DRUG ALLERGIES:   Allergies  Allergen Reactions  . Sulfa Antibiotics Itching and Hives  . Amoxicillin Hives  . Ampicillin Hives  . Keflet [Cephalexin] Itching  . Lisinopril Other (See Comments)  . Losartan Other (See Comments)  . Sulfamethoxazole-Trimethoprim Other (See Comments)  . Cephalosporins Hives    REVIEW OF SYSTEMS:   Review of Systems  Constitutional: Negative for fever, chills and malaise/fatigue.  HENT: Negative for ear pain, hearing loss, nosebleeds, sore throat and tinnitus.   Eyes: Negative  for blurred vision, double vision, pain, discharge and redness.  Respiratory: Positive for cough and shortness of breath. Negative for hemoptysis, sputum production and wheezing.   Cardiovascular: Negative for chest pain, palpitations, orthopnea and leg swelling.  Gastrointestinal: Negative for nausea, vomiting, abdominal pain, diarrhea, constipation, blood in stool and melena.  Genitourinary: Positive for frequency. Negative for dysuria, urgency and hematuria.  Musculoskeletal: Negative for back pain, joint pain and neck  pain.  Skin: Negative for itching and rash.  Neurological: Negative for dizziness, tingling, sensory change, focal weakness and seizures.       Transient episode of confusion/disorientation following found on the floor.  Endo/Heme/Allergies: Does not bruise/bleed easily.  Psychiatric/Behavioral: Negative for depression. The patient is not nervous/anxious.     MEDICATIONS AT HOME:   Prior to Admission medications   Medication Sig Start Date End Date Taking? Authorizing Provider  Cholecalciferol (VITAMIN D3) 1000 UNITS CAPS Take 2,000 Units by mouth.   Yes Historical Provider, MD  levothyroxine (SYNTHROID, LEVOTHROID) 100 MCG tablet Take 100 mcg by mouth every morning. 07/29/14  Yes Historical Provider, MD  magnesium oxide (MAG-OX) 400 MG tablet Take 400 mg by mouth at bedtime.   Yes Historical Provider, MD  metFORMIN (GLUCOPHAGE) 500 MG tablet Take 4 tablets by mouth daily.   Yes Historical Provider, MD  metFORMIN (GLUCOPHAGE) 500 MG tablet Take 500 mg by mouth daily with breakfast.   Yes Historical Provider, MD  metFORMIN (GLUCOPHAGE) 500 MG tablet Take 1,500 mg by mouth daily with supper.   Yes Historical Provider, MD  metoprolol tartrate (LOPRESSOR) 25 MG tablet Take 12.5 mg by mouth 2 (two) times daily.  07/24/11  Yes Historical Provider, MD  Multiple Vitamins-Minerals (MULTI FOR HIM 50+ PO) Take 1 tablet by mouth daily.   Yes Historical Provider, MD  Omega-3 Fatty Acids (FISH OIL) 1000 MG CAPS Take 1 capsule by mouth daily. 12/30/09  Yes Historical Provider, MD  Red Yeast Rice 600 MG TABS Take 2 tablets by mouth daily. 12/30/09  Yes Historical Provider, MD      VITAL SIGNS:  Blood pressure 132/52, pulse 96, temperature 98.4 F (36.9 C), temperature source Oral, resp. rate 24, height 5\' 8"  (1.727 m), weight 73.483 kg (162 lb), SpO2 95 %.  PHYSICAL EXAMINATION:  Physical Exam  Constitutional: He is oriented to person, place, and time. He appears well-developed and well-nourished. No  distress.  HENT:  Head: Normocephalic and atraumatic.  Right Ear: External ear normal.  Left Ear: External ear normal.  Nose: Nose normal.  Mouth/Throat: Oropharynx is clear and moist. No oropharyngeal exudate.  Minor abrasion over forehead. No active bleeding.  Eyes: EOM are normal. Pupils are equal, round, and reactive to light. No scleral icterus.  Conjunctival pallor + +  Neck: Normal range of motion. Neck supple. No JVD present. No thyromegaly present.  Cardiovascular: Normal rate, regular rhythm, normal heart sounds and intact distal pulses.  Exam reveals no friction rub.   No murmur heard. Respiratory: Effort normal. No respiratory distress. He has no wheezes. He has rales. He exhibits no tenderness.  GI: Soft. Bowel sounds are normal. He exhibits no distension and no mass. There is no tenderness. There is no rebound and no guarding.  Musculoskeletal: Normal range of motion. He exhibits no edema.  Lymphadenopathy:    He has no cervical adenopathy.  Neurological: He is alert and oriented to person, place, and time. He has normal reflexes. He displays normal reflexes. No cranial nerve deficit. He exhibits normal muscle tone.  Skin: Skin is warm. No rash noted. No erythema.  Psychiatric: He has a normal mood and affect. His behavior is normal. Thought content normal.   LABORATORY PANEL:   CBC  Recent Labs Lab 10/03/14 0330  WBC 21.6*  HGB 5.4*  HCT 19.6*  PLT 512*   ------------------------------------------------------------------------------------------------------------------  Chemistries   Recent Labs Lab 10/03/14 0330  NA 135  K 4.6  CL 102  CO2 18*  GLUCOSE 179*  BUN 18  CREATININE 0.98  CALCIUM 8.7*  AST 66*  ALT 16*  ALKPHOS 44  BILITOT 0.2*   ------------------------------------------------------------------------------------------------------------------  Cardiac Enzymes  Recent Labs Lab 10/03/14 0330  TROPONINI 1.32*    ------------------------------------------------------------------------------------------------------------------  RADIOLOGY:  Ct Head Wo Contrast  10/03/2014   CLINICAL DATA:  Fallin bathroom hitting head. Initial encounter.  EXAM: CT HEAD WITHOUT CONTRAST  TECHNIQUE: Contiguous axial images were obtained from the base of the skull through the vertex without intravenous contrast.  COMPARISON:  12/14/2011  FINDINGS: Skull and Sinuses:No acute fracture.  Small left maxillary sinus, favor posttraumatic over postinflammatory.  Orbits: Left cataract resection.  No acute finding.  Brain: No evidence of acute infarction, hemorrhage, hydrocephalus, or mass lesion/mass effect.  Remote left PCA territory infarct with extensive cortical and subcortical gliosis at the occipital lobe. Two remote lacunar infarcts noted in the right caudate head with mild periventricular small vessel ischemic gliosis. There is atrophy with a mild posterior predilection, stable from 2013.  IMPRESSION: 1. Negative for intracranial injury or fracture. 2. 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   Electronically Signed   By: Monte Fantasia M.D.   On: 10/03/2014 04:27   Dg Chest Port 1 View  10/03/2014   CLINICAL DATA:  Confusion.  EXAM: PORTABLE CHEST - 1 VIEW  COMPARISON:  None.  FINDINGS: Mild cardiomegaly. Negative aortic and hilar contours for technique. Diffuse interstitial opacity with Kerley lines. There is asymmetric density in the peripheral left chest. No pleural effusion. No pneumothorax.  IMPRESSION: 1. Mild pulmonary edema. 2. Focal opacity in the left lung, pneumonia versus asymmetric alveolar edema.   Electronically Signed   By: Monte Fantasia M.D.   On: 10/03/2014 04:50    EKG:   Orders placed or performed during the hospital encounter of 10/03/14  . ED EKG  . ED EKG  Normal sinus rhythm with ventricular rate of 81 bpm, right bundle branch block  IMPRESSION AND PLAN:   1. Sepsis-possible  pneumonia, community-acquired. Likely UTI. Plan: Admit, continue IV antibiotics-Levaquin, follow-up WBC, blood and urine cultures. 2. Severe anemia requiring PRBC transfusion. 3. Transient episode of altered sensorium/confusion, likely secondary to sepsis. Resolved currently. Monitor Plan: Transfuse PRBC, monitor serials H&H. GI consultation for further workup. 3. GI bleed, heme-positive stools. Plan: Nothing by mouth, IV Protonix, GI consultation requested for further workup. 4. Elevated troponin. No chest pain, EKG no acute ischemic changes. Likely demand ischemia because of severe anemia versus an NSTEMI. Plan: Telemetry monitoring, continue beta blocker, start statin. No aspirin or heparin because of GI bleed. Echocardiogram and cardiology consultation requested for further evaluation. 5. Diabetes mellitus type 2. Hold oral medications for now since patient nothing by mouth. Sliding scale insulin, monitor blood sugars closely. 6. Hypertension, stable. Monitor BP closely. 7. Hyperlipidemia. Start statin. 8. Hypothyroidism, stable on Synthroid. 9. Sleep apnea, on CPAP.   All the records are reviewed and case discussed with ED provider. Management plans discussed with the patient, family and they are in agreement.  CODE STATUS: Full code  TOTAL TIME TAKING CARE OF THIS PATIENT: 50 minutes.    Azucena Freed N M.D on 10/03/2014 at 6:13 AM  Between 7am to 6pm - Pager - [phone]  After 6pm go to [email or web address] - password [email or web address] [facility]  Office  [phone]  CC: Primary care physician; Bobetta Lime, MD

## 2014-10-03 NOTE — Progress Notes (Signed)
ANTIBIOTIC CONSULT NOTE - INITIAL  Pharmacy Consult for Vancomycin, Meropenem, Azithromycin Indication: rule out sepsis  Allergies  Allergen Reactions  . Sulfa Antibiotics Itching and Hives  . Amoxicillin Hives  . Ampicillin Hives  . Keflet [Cephalexin] Itching  . Lisinopril Other (See Comments)  . Losartan Other (See Comments)  . Sulfamethoxazole-Trimethoprim Other (See Comments)  . Cephalosporins Hives    Patient Measurements: Height: 5\' 8"  (172.7 cm) Weight: 162 lb (73.483 kg) IBW/kg (Calculated) : 68.4 Adjusted Body Weight: n/a  Vital Signs: Temp: 98.2 F (36.8 C) (08/20 1400) Temp Source: Axillary (08/20 1045) BP: 118/62 mmHg (08/20 1500) Pulse Rate: 93 (08/20 1500) Intake/Output from previous day: 08/19 0701 - 08/20 0700 In: -  Out: 225 [Urine:225] Intake/Output from this shift: Total I/O In: 2250 [I.V.:240; Blood:310; Other:1450; IV Piggyback:250] Out: -   Labs:  Recent Labs  10/03/14 0330 10/03/14 1506  WBC 21.6* 31.9*  HGB 5.4* 8.7*  PLT 512* 467*  CREATININE 0.98 1.18   Estimated Creatinine Clearance: 53.9 mL/min (by C-G formula based on Cr of 1.18). No results for input(s): VANCOTROUGH, VANCOPEAK, VANCORANDOM, GENTTROUGH, GENTPEAK, GENTRANDOM, TOBRATROUGH, TOBRAPEAK, TOBRARND, AMIKACINPEAK, AMIKACINTROU, AMIKACIN in the last 72 hours.   Microbiology: Recent Results (from the past 720 hour(s))  MRSA PCR Screening     Status: None   Collection Time: 10/03/14  8:42 AM  Result Value Ref Range Status   MRSA by PCR NEGATIVE NEGATIVE Final    Comment:        The GeneXpert MRSA Assay (FDA approved for NASAL specimens only), is one component of a comprehensive MRSA colonization surveillance program. It is not intended to diagnose MRSA infection nor to guide or monitor treatment for MRSA infections.     Medical History: Past Medical History  Diagnosis Date  . Diabetes mellitus without complication   . Hypertension   . Hearing loss     left  ear  . TIA (transient ischemic attack)   . Hypothyroidism   . Sleep apnea   . Stroke   . Arthritis     Medications:  Anti-infectives    Start     Dose/Rate Route Frequency Ordered Stop   10/04/14 0800  azithromycin (ZITHROMAX) 500 mg in dextrose 5 % 250 mL IVPB     500 mg 250 mL/hr over 60 Minutes Intravenous Every 24 hours 10/03/14 1624     10/04/14 0500  vancomycin (VANCOCIN) 1,250 mg in sodium chloride 0.9 % 250 mL IVPB     1,250 mg 166.7 mL/hr over 90 Minutes Intravenous Every 18 hours 10/03/14 1631     10/03/14 1800  levofloxacin (LEVAQUIN) IVPB 750 mg  Status:  Discontinued     750 mg 100 mL/hr over 90 Minutes Intravenous Every 24 hours 10/03/14 0942 10/03/14 1553   10/03/14 1800  piperacillin-tazobactam (ZOSYN) IVPB 3.375 g  Status:  Discontinued     3.375 g 100 mL/hr over 30 Minutes Intravenous 4 times per day 10/03/14 1553 10/03/14 1618   10/03/14 1800  meropenem (MERREM) 1 g in sodium chloride 0.9 % 100 mL IVPB     1 g 200 mL/hr over 30 Minutes Intravenous 3 times per day 10/03/14 1618     10/03/14 1615  vancomycin (VANCOCIN) 1,250 mg in sodium chloride 0.9 % 250 mL IVPB     1,250 mg 166.7 mL/hr over 90 Minutes Intravenous STAT 10/03/14 1608 10/04/14 1615   10/03/14 1600  azithromycin (ZITHROMAX) 250 mg in dextrose 5 % 125 mL IVPB  Status:  Discontinued  250 mg 125 mL/hr over 60 Minutes Intravenous Every 24 hours 10/03/14 1553 10/03/14 1623   10/03/14 0515  levofloxacin (LEVAQUIN) IVPB 750 mg     750 mg 100 mL/hr over 90 Minutes Intravenous  Once 10/03/14 0504 10/03/14 0634     Assessment: Patient initially started on Levaquin 750mg  IV q24h for possible sepsis due to pneumonia. Patient condition declining so MD has changed antibiotics to Vancomycin, Zosyn, and Azithromycin per pharmacist. Noted patient allergies to amoxillin and ampicillin of hives. Spoke to Dr. Ether Griffins about allergy and will switch from Zosyn to Meropenem.  Goal of Therapy:  Vancomycin trough  level 15-20 mcg/ml Resolution of symptoms  Plan:  Will order Meropenem 1g IV q8h. Will order Vancomycin 1250mg  IV once followed in 12hr with 1250mg  IV q18h for stacked dosing. Will check a trough level prior to 5th dose. Will order Azithromycin 500mg  IV q24h starting tomorrow, patient received Levaquin dose this morning so need to separate by 24hr to avoid potential QT issues.  Paulina Fusi, PharmD, BCPS 10/03/2014 4:43 PM

## 2014-10-03 NOTE — Consult Note (Signed)
[name]  CARDIOLOGY CONSULT NOTE  Patient [name] MRN: [id number] DOB/AGE: 73/12/1940 74 y.o.  Admit date: 10/03/2014 Referring Physician Reece Levy Primary Physician [facility] Primary Cardiologist  Reason for Consultation elevated troponin  HPI: The patient is a 74 year old male referred for evaluation of elevated troponin. The patient has a history of type 2 diabetes, essential hypertension, hyperlipidemia and prior CVA. Patient was brought to [facility] emergency room by his wife noted that the patient was urinating frequently, fell on the floor in the bathroom, with confusion. In the emergency room the patient was noted to be hypoxic, CXR revealing possible left lower lobe pneumonia, with elevated white count and severe anemia. Other admission labs were notable for elevated troponin of 1.32. ECG revealed sinus rhythm without acute ischemic ST-T wave changes. Patient was treated with his fluids and blood transfusion developed pulmonary edema which has responded to intravenous furosemide.  Review of systems complete and found to be negative unless listed above     Past Medical History  Diagnosis Date  . Diabetes mellitus without complication   . Hypertension   . Hearing loss     left ear  . TIA (transient ischemic attack)   . Hypothyroidism   . Sleep apnea   . Stroke   . Arthritis     Past Surgical History  Procedure Laterality Date  . Nose surgery    . Appendectomy    . Eye surgery    . Fracture surgery      Prescriptions prior to admission  Medication Sig Dispense Refill Last Dose  . Cholecalciferol (VITAMIN D3) 1000 UNITS CAPS Take 2,000 Units by mouth.   10/02/2014 at Unknown time  . levothyroxine (SYNTHROID, LEVOTHROID) 100 MCG tablet Take 100 mcg by mouth every morning.   10/02/2014 at Unknown time  . magnesium oxide (MAG-OX) 400 MG tablet Take 400 mg by mouth at bedtime.   10/02/2014 at Unknown time  . metFORMIN (GLUCOPHAGE) 500 MG tablet Take 4 tablets by mouth  daily.   10/02/2014 at Unknown time  . metFORMIN (GLUCOPHAGE) 500 MG tablet Take 500 mg by mouth daily with breakfast.   10/02/2014 at Unknown time  . metFORMIN (GLUCOPHAGE) 500 MG tablet Take 1,500 mg by mouth daily with supper.   10/02/2014 at Unknown time  . metoprolol tartrate (LOPRESSOR) 25 MG tablet Take 12.5 mg by mouth 2 (two) times daily.    10/02/2014 at Unknown time  . Multiple Vitamins-Minerals (MULTI FOR HIM 50+ PO) Take 1 tablet by mouth daily.   10/02/2014 at Unknown time  . Omega-3 Fatty Acids (FISH OIL) 1000 MG CAPS Take 1 capsule by mouth daily.   10/02/2014 at Unknown time  . Red Yeast Rice 600 MG TABS Take 2 tablets by mouth daily.   10/02/2014 at Unknown time   Social History   Social History  . Marital Status: Married    Spouse Name: N/A  . Number of Children: N/A  . Years of Education: N/A   Occupational History  . Not on file.   Social History Main Topics  . Smoking status: Former Smoker    Quit date: 10/03/1983  . Smokeless tobacco: Never Used  . Alcohol Use: Yes     Comment: rarely  . Drug Use: No  . Sexual Activity: Not on file   Other Topics Concern  . Not on file   Social History Narrative  . No narrative on file    Family History  Problem Relation Age of Onset  .  Hypertension Mother   . Lung cancer Father   . Hypertension Brother       Review of systems complete and found to be negative unless listed above      PHYSICAL EXAM  General: Well developed, well nourished, in no acute distress HEENT:  Normocephalic and atramatic Neck:  No JVD.  Lungs: Clear bilaterally to auscultation and percussion. Heart: HRRR . Normal S1 and S2 without gallops or murmurs.  Abdomen: Bowel sounds are positive, abdomen soft and non-tender  Msk:  Back normal, normal gait. Normal strength and tone for age. Extremities: No clubbing, cyanosis or edema.   Neuro: Alert and oriented X 3. Psych:  Good affect, responds appropriately  Labs:   Lab Results  Component  Value Date   WBC 21.6* 10/03/2014   HGB 5.4* 10/03/2014   HCT 19.6* 10/03/2014   MCV 58.6* 10/03/2014   PLT 512* 10/03/2014    Recent Labs Lab 10/03/14 0330  NA 135  K 4.6  CL 102  CO2 18*  BUN 18  CREATININE 0.98  CALCIUM 8.7*  PROT 7.6  BILITOT 0.2*  ALKPHOS 44  ALT 16*  AST 66*  GLUCOSE 179*   Lab Results  Component Value Date   TROPONINI 1.32* 10/03/2014   No results found for: CHOL No results found for: HDL No results found for: LDLCALC No results found for: TRIG No results found for: CHOLHDL No results found for: LDLDIRECT    Radiology: Ct Head Wo Contrast  10/03/2014   CLINICAL DATA:  Fallin bathroom hitting head. Initial encounter.  EXAM: CT HEAD WITHOUT CONTRAST  TECHNIQUE: Contiguous axial images were obtained from the base of the skull through the vertex without intravenous contrast.  COMPARISON:  12/14/2011  FINDINGS: Skull and Sinuses:No acute fracture.  Small left maxillary sinus, favor posttraumatic over postinflammatory.  Orbits: Left cataract resection.  No acute finding.  Brain: No evidence of acute infarction, hemorrhage, hydrocephalus, or mass lesion/mass effect.  Remote left PCA territory infarct with extensive cortical and subcortical gliosis at the occipital lobe. Two remote lacunar infarcts noted in the right caudate head with mild periventricular small vessel ischemic gliosis. There is atrophy with a mild posterior predilection, stable from 2013.  IMPRESSION: 1. Negative for intracranial injury or fracture. 2. 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   Electronically Signed   By: Monte Fantasia M.D.   On: 10/03/2014 04:27   Dg Chest Port 1 View  10/03/2014   CLINICAL DATA:  Confusion.  EXAM: PORTABLE CHEST - 1 VIEW  COMPARISON:  None.  FINDINGS: Mild cardiomegaly. Negative aortic and hilar contours for technique. Diffuse interstitial opacity with Kerley lines. There is asymmetric density in the peripheral left chest. No pleural  effusion. No pneumothorax.  IMPRESSION: 1. Mild pulmonary edema. 2. Focal opacity in the left lung, pneumonia versus asymmetric alveolar edema.   Electronically Signed   By: Monte Fantasia M.D.   On: 10/03/2014 04:50    EKG: Normal sinus rhythm  ASSESSMENT AND PLAN:   1. Elevated troponin, we will demand supply ischemia, secondary to sepsis, pneumonia and GI bleed with marked anemia, history of chest pain, nondiagnostic ECG 2. Pulmonary edema, secondary to IV fluid resuscitation and blood transfusion, responding to furosemide 3. Sepsis/pneumonia, with respiratory failure 4. Severe anemia, heme positive stools, probable GI bleed  Recommendations  1. Agree with overall current therapy 2. Defer full dose anticoagulation 3. Defer invasive cardiac evaluation at this time 4. Review 2-D echocardiogram 5. Further recommendations pending  patient's initial clinical course and echocardiogram results  Signed: Aniyia Rane MD,PhD, [facility] 10/03/2014, 9:17 AM

## 2014-10-03 NOTE — ED Provider Notes (Signed)
-----------------------------------------   8:09 AM on 10/03/2014 -----------------------------------------  Patient with frothing from the mouth, most consistent with pulmonary edema. Patient currently receiving blood products. Patient suctioned, currently on BiPAP. Given 40 mg of additional Lasix, and nitroglycerin sublingual, current blood pressure 160/70. No distress at this time. Patient to be moved to the ICU for continued treatment and very close monitoring.  Harvest Dark, MD 10/03/14 [phone]

## 2014-10-03 NOTE — Consult Note (Signed)
GI Inpatient Consult Note  Reason for Consult:Severe anemia and heme positive stool   Attending Requesting Consult:  History of Present Illness: [name] is a 74 y.o. male  Past Medical History:  Past Medical History  Diagnosis Date  . Diabetes mellitus without complication   . Hypertension   . Hearing loss     left ear  . TIA (transient ischemic attack)   . Hypothyroidism   . Sleep apnea   . Stroke   . Arthritis     Problem List: Patient Active Problem List   Diagnosis Date Noted  . Sepsis due to pneumonia 10/03/2014  . Severe anemia 10/03/2014  . GI bleed 10/03/2014  . Elevated troponin 10/03/2014  . DM (diabetes mellitus) 10/03/2014  . HTN (hypertension) 10/03/2014    Past Surgical History: Past Surgical History  Procedure Laterality Date  . Nose surgery    . Appendectomy    . Eye surgery    . Fracture surgery      Allergies: Allergies  Allergen Reactions  . Sulfa Antibiotics Itching and Hives  . Amoxicillin Hives  . Ampicillin Hives  . Keflet [Cephalexin] Itching  . Lisinopril Other (See Comments)  . Losartan Other (See Comments)  . Sulfamethoxazole-Trimethoprim Other (See Comments)  . Cephalosporins Hives    Home Medications: Prescriptions prior to admission  Medication Sig Dispense Refill Last Dose  . Cholecalciferol (VITAMIN D3) 1000 UNITS CAPS Take 2,000 Units by mouth.   10/02/2014 at Unknown time  . levothyroxine (SYNTHROID, LEVOTHROID) 100 MCG tablet Take 100 mcg by mouth every morning.   10/02/2014 at Unknown time  . magnesium oxide (MAG-OX) 400 MG tablet Take 400 mg by mouth at bedtime.   10/02/2014 at Unknown time  . metFORMIN (GLUCOPHAGE) 500 MG tablet Take 4 tablets by mouth daily.   10/02/2014 at Unknown time  . metFORMIN (GLUCOPHAGE) 500 MG tablet Take 500 mg by mouth daily with breakfast.   10/02/2014 at Unknown time  . metFORMIN (GLUCOPHAGE) 500 MG tablet Take 1,500 mg by mouth daily with supper.   10/02/2014 at Unknown time  .  metoprolol tartrate (LOPRESSOR) 25 MG tablet Take 12.5 mg by mouth 2 (two) times daily.    10/02/2014 at Unknown time  . Multiple Vitamins-Minerals (MULTI FOR HIM 50+ PO) Take 1 tablet by mouth daily.   10/02/2014 at Unknown time  . Omega-3 Fatty Acids (FISH OIL) 1000 MG CAPS Take 1 capsule by mouth daily.   10/02/2014 at Unknown time  . Red Yeast Rice 600 MG TABS Take 2 tablets by mouth daily.   10/02/2014 at Unknown time   Home medication reconciliation was completed with the patient.   Scheduled Inpatient Medications:   . atorvastatin  20 mg Oral q1800  . furosemide      . furosemide  40 mg Intravenous Q12H  . insulin aspart  0-9 Units Subcutaneous 6 times per day  . levofloxacin (LEVAQUIN) IV  750 mg Intravenous Q24H  . levothyroxine  100 mcg Oral QAC breakfast  . magnesium oxide  400 mg Oral QHS  . metoprolol tartrate  12.5 mg Oral BID  . multivitamin with minerals  1 tablet Oral Daily  . nitroGLYCERIN  1 inch Topical 4 times per day  . nitroGLYCERIN      . omega-3 acid ethyl esters  1 g Oral Daily  . pantoprazole (PROTONIX) IV  40 mg Intravenous Q12H  . sodium chloride  3 mL Intravenous Q12H    Continuous Inpatient Infusions:   .  sodium bicarbonate  infusion 1000 mL 50 mL/hr at 10/03/14 0924    PRN Inpatient Medications:  acetaminophen **OR** acetaminophen, nitroGLYCERIN, ondansetron **OR** ondansetron (ZOFRAN) IV  Family History: family history includes Hypertension in his brother and mother; Lung cancer in his father.  The patient's family history is negative for inflammatory bowel disorders, GI malignancy, or solid organ transplantation.  Social History:   reports that he quit smoking about 31 years ago. He has never used smokeless tobacco. He reports that he drinks alcohol. He reports that he does not use illicit drugs. The patient denies ETOH, tobacco, or drug use.   Review of Systems: Pt on a BIPAP breathing device and I am unable to obtain any meaningful review of  systems. He is in CCU and getting blood and lasix and is on antibiotics.  His wife says he had a colonoscopy 10 years ago and an EGD maybe 20-30 years ago.  He quit smoking 30 years ago.  The standard review of systems that follows this paragraph is not applicable to this patient what so ever. Constitutional: Weight is stable.  Eyes: No changes in vision. ENT: No oral lesions, sore throat.  GI: see HPI.  Heme/Lymph: No easy bruising.  CV: No chest pain.  GU: No hematuria.  Integumentary: No rashes.  Neuro: No headaches.  Psych: No depression/anxiety.  Endocrine: No heat/cold intolerance.  Allergic/Immunologic: No urticaria.  Resp: No cough, SOB.  Musculoskeletal: No joint swell   Physical Examination: BP 139/65 mmHg  Pulse 107  Temp(Src) 98 F (36.7 C) (Axillary)  Resp 30  Ht 5\' 8"  (1.727 m)  Wt 73.483 kg (162 lb)  BMI 24.64 kg/m2  SpO2 100% Gen: Lying in bed in CCU of a BIPAP mask and getting blood transfusion. HEENT: pale conjunctiva, no jaundice Neck: supple, no JVD or thyromegaly Chest: CTA bilaterally, decreased breath sounds lower left. CV: RRR, no m/g/c/r Abd: soft, NT, ND, +BS in all four quadrants; no HSM, guarding, ridigity, or rebound tenderness Ext: no edema,  Skin: no rash or lesions noted Lymph: no LAD  Data: Lab Results  Component Value Date   WBC 21.6* 10/03/2014   HGB 5.4* 10/03/2014   HCT 19.6* 10/03/2014   MCV 58.6* 10/03/2014   PLT 512* 10/03/2014    Recent Labs Lab 10/03/14 0330  HGB 5.4*   Lab Results  Component Value Date   NA 135 10/03/2014   K 4.6 10/03/2014   CL 102 10/03/2014   CO2 18* 10/03/2014   BUN 18 10/03/2014   CREATININE 0.98 10/03/2014   Lab Results  Component Value Date   ALT 16* 10/03/2014   AST 66* 10/03/2014   ALKPHOS 44 10/03/2014   BILITOT 0.2* 10/03/2014    Recent Labs Lab 10/03/14 0330  INR 1.18   Assessment/Plan: [name] is a 74 y.o. male with elevated troponin, elevated WBC, possible pneumonia,  severely anemic, hypoxic on BIPAP likely has had an MI possible NSTWMI, is in CHF, heme pos stool of unknown origin, no endoscopic exam in at least 10 years.  Recommendations:Balance CHF with anemia, lasix as needed, continue BIPAP and intubate if needed.  I of course would not do EGD unless he has obvious life threatening bleeding and he would need to be intubated for that procedure and remain on it after any endoscopic exam.  Monitor troponin and check CPK as he could have had an MI causing his acute heart failure with the cause of the MI being severe anemia.  I will follow  with you.  Thank you for the consult. Please call with questions or concerns.  Gaylyn Cheers, MD

## 2014-10-03 NOTE — Progress Notes (Signed)
   10/03/14 1300  Clinical Encounter Type  Visited With Health care provider  Visit Type Initial  Referral From Patient  Consult/Referral To Chaplain  Spiritual Encounters  Spiritual Needs Prayer  Chaplain spoke with nurse on duty prior to going into patients room. She stated patient was sleeping with a sitter by his side and wife went home to change. I will check back in later and asked if needed sooner to please page.   [place] [phone]

## 2014-10-03 NOTE — Progress Notes (Signed)
[facility] Consultation   ASSESSMENT / PLAN:  This is a 74 year old man who was admitted to the hospital with near syncope, likely related to severe anemia of uncertain etiology. The patient has no evidence of gastric intestinal bleeding at this time, upon admission, he was transfusion developed acute pulmonary edema and is now BiPAP dependent. His course has been complicated by an nSTEMI due to demand ischemia  PULMONARY 1.acute respiratory failure due to pulmonary edema with the possibility of pneumonia. Plan: The patient has been started on Levaquin, which will be continued for the time being. Chest x-ray shows evidence of prior pulmonary disease -Check ABG. -Continue Lasix. -Wean down or off BiPAP as tolerated.  ENDOCRINE Glucose appears to be elevated, we'll continue the patient on sliding scale insulin  CARDIOVASCULAR Acute and STEMI likely due to demand ischemia. -Cardiologist has been consulted. Continue conservative measures.  RENAL Stable kidney function. At this time.   GASTROINTESTINAL No evidence of GI bleeding at this time. gastroenterology has been consulted.  HEMATOLOGIC Severe anemia of uncertain etiology. -Continue to transfuse as needed to keep hemoglobin above 7.  INFECTIOUS Possible pneumonia, urinary. Blood cultures are pending at this time. -Continue empiric antibiotics with Levaquin. Await culture results.  NEUROLOGIC - Stable   GI prophylaxis. -On Protonix.  DVT prophylaxis. -We'll start heparin.   ---------------------------------------   Name: [name] MRN: [id number] DOB: 07-Jul-1940    ADMISSION DATE:  10/03/2014 CONSULTATION DATE:  10/03/2014  REFERRING MD :  Dr. Marcial Pacas  CHIEF COMPLAINT: Dyspnea   HISTORY OF PRESENT ILLNESS:   The patient is a 74 year old male with a history of diabetes mellitus type 2. The patient is currently on a BiPAP with respiratory distress. Therefore, all history was  obtained from the chart and from staff. The patient also has a history of hypertension, hyperlipidemia, hypothyroidism, and previous stroke. The patient was brought in after he was found on the floor by his wife. He had hit his head on the wall but did not lose consciousness, rather he was confused. He been having cough for the previous few days. Upon evaluation in the emergency room was noted that he was fairly anemic with a hemoglobin of 5.4. Troponins were also elevated. He was given blood transfusions, but developed acute pulmonary edema with frothy secretions and was given Lasix and started on BiPAP. Subsequently he was brought to the intensive care unit where he has been maintained on BiPAP. Initial blood gas obtained before starting BiPAP showed a pH of 7.06. He has been started empirically on antibiotics. Currently the patient is awake and alert and on BiPAP. He appears to be tachypneic with a respiratory rate of between 25 and 30/m. Subsequently, his labs this morning showed worsening elevation of his troponins, which is thought to be due to demand ischemia. He is been also evaluated by gastroenterology who recommended conservative management. Continue to follow hemoglobins. There are no plans for endoscopy or colonoscopy at this time. the patient reports no history of bleeding.   PAST MEDICAL HISTORY :  Past Medical History  Diagnosis Date  . Diabetes mellitus without complication   . Hypertension   . Hearing loss     left ear  . TIA (transient ischemic attack)   . Hypothyroidism   . Sleep apnea   . Stroke   . Arthritis    Past Surgical History  Procedure Laterality Date  . Nose surgery    . Appendectomy    . Eye surgery    .  Fracture surgery     Prior to Admission medications   Medication Sig Start Date End Date Taking? Authorizing Provider  Cholecalciferol (VITAMIN D3) 1000 UNITS CAPS Take 2,000 Units by mouth.   Yes Historical Provider, MD  levothyroxine (SYNTHROID,  LEVOTHROID) 100 MCG tablet Take 100 mcg by mouth every morning. 07/29/14  Yes Historical Provider, MD  magnesium oxide (MAG-OX) 400 MG tablet Take 400 mg by mouth at bedtime.   Yes Historical Provider, MD  metFORMIN (GLUCOPHAGE) 500 MG tablet Take 4 tablets by mouth daily.   Yes Historical Provider, MD  metFORMIN (GLUCOPHAGE) 500 MG tablet Take 500 mg by mouth daily with breakfast.   Yes Historical Provider, MD  metFORMIN (GLUCOPHAGE) 500 MG tablet Take 1,500 mg by mouth daily with supper.   Yes Historical Provider, MD  metoprolol tartrate (LOPRESSOR) 25 MG tablet Take 12.5 mg by mouth 2 (two) times daily.  07/24/11  Yes Historical Provider, MD  Multiple Vitamins-Minerals (MULTI FOR HIM 50+ PO) Take 1 tablet by mouth daily.   Yes Historical Provider, MD  Omega-3 Fatty Acids (FISH OIL) 1000 MG CAPS Take 1 capsule by mouth daily. 12/30/09  Yes Historical Provider, MD  Red Yeast Rice 600 MG TABS Take 2 tablets by mouth daily. 12/30/09  Yes Historical Provider, MD   Allergies  Allergen Reactions  . Sulfa Antibiotics Itching and Hives  . Amoxicillin Hives  . Ampicillin Hives  . Keflet [Cephalexin] Itching  . Lisinopril Other (See Comments)  . Losartan Other (See Comments)  . Sulfamethoxazole-Trimethoprim Other (See Comments)  . Cephalosporins Hives    FAMILY HISTORY:  Family History  Problem Relation Age of Onset  . Hypertension Mother   . Lung cancer Father   . Hypertension Brother    SOCIAL HISTORY:  reports that he quit smoking about 31 years ago. He has never used smokeless tobacco. He reports that he drinks alcohol. He reports that he does not use illicit drugs.  REVIEW OF SYSTEMS:   Patient is currently on a BiPAP of her stress distress. Therefore, review of systems could not be obtained.   VITAL SIGNS: Temp:  [97.8 F (36.6 C)-99.1 F (37.3 C)] 98 F (36.7 C) (08/20 1045) Pulse Rate:  [81-113] 107 (08/20 1045) Resp:  [20-32] 30 (08/20 1045) BP: (132-199)/(48-126) 139/65  mmHg (08/20 1045) SpO2:  [88 %-100 %] 100 % (08/20 1045) FiO2 (%):  [75 %] 75 % (08/20 0702) Weight:  [73.483 kg (162 lb)] 73.483 kg (162 lb) (08/20 0317) HEMODYNAMICS:   VENTILATOR SETTINGS: Vent Mode:  [-]  FiO2 (%):  [75 %] 75 % INTAKE / OUTPUT:  Intake/Output Summary (Last 24 hours) at 10/03/14 1210 Last data filed at 10/03/14 1100  Gross per 24 hour  Intake     30 ml  Output   1175 ml  Net  -1145 ml    Physical Examination:   VS: BP 139/65 mmHg  Pulse 107  Temp(Src) 98 F (36.7 C) (Axillary)  Resp 30  Ht 5\' 8"  (1.727 m)  Wt 73.483 kg (162 lb)  BMI 24.64 kg/m2  SpO2 100%  General Appearance: No distress  Neuro:without focal findings, mental status, speech normal, alert and oriented, cranial nerves 2-12 intact, reflexes normal and symmetric, sensation grossly normal  HEENT: PERRLA, EOM intact, no ptosis, no other lesions noticed;  Pulmonary: normal breath sounds., diaphragmatic excursion normal.No wheezing, No rales;     CardiovascularNormal S1,S2.  No m/r/g.    Abdomen: Benign, Soft, non-tender, No masses, hepatosplenomegaly, No lymphadenopathy Renal:  No costovertebral tenderness  GU:  Not performed at this time. Endoc: No evident thyromegaly, no signs of acromegaly. Skin:   warm, no rashes, no ecchymosis  Extremities: normal, no cyanosis, clubbing, no edema, warm with normal capillary refill.    LABS: Reviewed  Imaging Ct Head Wo Contrast  10/03/2014   CLINICAL DATA:  Fallin bathroom hitting head. Initial encounter.  EXAM: CT HEAD WITHOUT CONTRAST  TECHNIQUE: Contiguous axial images were obtained from the base of the skull through the vertex without intravenous contrast.  COMPARISON:  12/14/2011  FINDINGS: Skull and Sinuses:No acute fracture.  Small left maxillary sinus, favor posttraumatic over postinflammatory.  Orbits: Left cataract resection.  No acute finding.  Brain: No evidence of acute infarction, hemorrhage, hydrocephalus, or mass lesion/mass effect.   Remote left PCA territory infarct with extensive cortical and subcortical gliosis at the occipital lobe. Two remote lacunar infarcts noted in the right caudate head with mild periventricular small vessel ischemic gliosis. There is atrophy with a mild posterior predilection, stable from 2013.  IMPRESSION: 1. Negative for intracranial injury or fracture. 2. 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   Electronically Signed   By: Monte Fantasia M.D.   On: 10/03/2014 04:27   Dg Chest Port 1 View  10/03/2014   CLINICAL DATA:  Confusion.  EXAM: PORTABLE CHEST - 1 VIEW  COMPARISON:  None.  FINDINGS: Mild cardiomegaly. Negative aortic and hilar contours for technique. Diffuse interstitial opacity with Kerley lines. There is asymmetric density in the peripheral left chest. No pleural effusion. No pneumothorax.  IMPRESSION: 1. Mild pulmonary edema. 2. Focal opacity in the left lung, pneumonia versus asymmetric alveolar edema.   Electronically Signed   By: Monte Fantasia M.D.   On: 10/03/2014 04:50        --Deep Ashby Dawes, MD.  Board Certified in Internal Medicine, Pulmonary Medicine, [place], and Sleep Medicine.  Pager [phone] [facility] Office Number: [phone]  Patricia Pesa, M.D.  Vilinda Boehringer, M.D.  Cheral Marker, M.D  [place].  I have personally obtained a history, examined the patient, evaluated laboratory and imaging results, formulated the assessment and plan and placed orders. The Patient requires high complexity decision making for assessment and support, frequent evaluation and titration of therapies, application of advanced monitoring technologies and extensive interpretation of multiple databases. The patient has critical illness that could lead imminently to failure of 1 or more organ systems and requires the highest level of physician preparedness to intervene.  Critical Care Time devoted to patient care  services described in this note is 35 minutes and is exclusive of time spent in procedures.   10/03/2014, 12:10 PM

## 2014-10-03 NOTE — ED Notes (Signed)
Reports since 10pm has been going to the bathroom every few minutes, unsure if urination well or dribbling.  At approx 1am wife reports heard a noise and found him in the bathroom in the floor.  Also reports breathing heavily.

## 2014-10-03 NOTE — ED Notes (Signed)
Patient is resting comfortably. 

## 2014-10-03 NOTE — ED Notes (Signed)
RT at bedside.

## 2014-10-03 NOTE — Progress Notes (Signed)
[facility] [place] NAME: [name]    MR#:  [id number]  DATE OF BIRTH:  October 10, 1940  SUBJECTIVE:  CHIEF COMPLAINT:   Chief Complaint  Patient presents with  . Altered Mental Status  . Urinary Frequency  . Head Injury   patient as 74 year old Caucasian male who presents to the hospital with altered mental status, increased frequency of urination and fall as well as head injury. He was noted to be severely anemic with hemoglobin level of 5.4. He was started on packed red blood cell transfusion and the having frothing from the mouth, consistent with pulmonary edema, now he is on BiPAP and he is struggling to breathe. PH is 7.05 and his PCO2 is normal,. Bicarbonate level is low at 18. Initiated on bicarbonate, IV drip and ABGs are going to be rechecked, patient will be intubated if no improvement  Review of Systems  Unable to perform ROS: acuity of condition    VITAL SIGNS: Blood pressure 139/65, pulse 107, temperature 98 F (36.7 C), temperature source Axillary, resp. rate 30, height 5\' 8"  (1.727 m), weight 73.483 kg (162 lb), SpO2 100 %.  PHYSICAL EXAMINATION:   GENERAL:  74 y.o.-year-old patient lying in the bed in severe respiratory distress. He is pale, tachypneic as well as tachycardic, in severe discomfort EYES: Pupils equal, round, reactive to light and accommodation. No scleral icterus. Extraocular muscles intact.  HEENT: Head atraumatic, normocephalic. Oropharynx and nasopharynx clear.  NECK:  Supple, no jugular venous distention. No thyroid enlargement, no tenderness.  LUNGS: Diminished breath sounds bilaterally, no wheezing, rales,rhonchi or crepitation, using accessory muscles to breathe. Has severe respiratory distress. BiPAP mask is on the face  CARDIOVASCULAR: S1, S2 normal. No murmurs, rubs, or gallops. Tachycardic. 2 to 3/6 systolic murmur was heard radiating to the left axilla, rhythm was regular ABDOMEN: Soft,  nontender, nondistended. Bowel sounds present. No organomegaly or mass.  EXTREMITIES: No pedal edema, cyanosis, or clubbing.  NEUROLOGIC: Cranial nerves II through XII are intact. Muscle strength 5/5 in all extremities. Sensation intact. Gait not checked.  PSYCHIATRIC: The patient is alert and oriented x 3. Opening eyes intermittently and shakes his head to answer questions. Nonverbal due to severe shortness of breath SKIN: No obvious rash, lesion, or ulcer.   ORDERS/RESULTS REVIEWED:   CBC  Recent Labs Lab 10/03/14 0330  WBC 21.6*  HGB 5.4*  HCT 19.6*  PLT 512*  MCV 58.6*  MCH 16.1*  MCHC 27.5*  RDW 23.2*   ------------------------------------------------------------------------------------------------------------------  Chemistries   Recent Labs Lab 10/03/14 0330  NA 135  K 4.6  CL 102  CO2 18*  GLUCOSE 179*  BUN 18  CREATININE 0.98  CALCIUM 8.7*  AST 66*  ALT 16*  ALKPHOS 44  BILITOT 0.2*   ------------------------------------------------------------------------------------------------------------------ estimated creatinine clearance is 64.9 mL/min (by C-G formula based on Cr of 0.98). ------------------------------------------------------------------------------------------------------------------ No results for input(s): TSH, T4TOTAL, T3FREE, THYROIDAB in the last 72 hours.  Invalid input(s): FREET3  Cardiac Enzymes  Recent Labs Lab 10/03/14 0330 10/03/14 0946  TROPONINI 1.32* 7.83*   ------------------------------------------------------------------------------------------------------------------ Invalid input(s): POCBNP ---------------------------------------------------------------------------------------------------------------  RADIOLOGY: Ct Head Wo Contrast  10/03/2014   CLINICAL DATA:  Fallin bathroom hitting head. Initial encounter.  EXAM: CT HEAD WITHOUT CONTRAST  TECHNIQUE: Contiguous axial images were obtained from the base of the skull  through the vertex without intravenous contrast.  COMPARISON:  12/14/2011  FINDINGS: Skull and Sinuses:No acute fracture.  Small left maxillary sinus, favor posttraumatic over  postinflammatory.  Orbits: Left cataract resection.  No acute finding.  Brain: No evidence of acute infarction, hemorrhage, hydrocephalus, or mass lesion/mass effect.  Remote left PCA territory infarct with extensive cortical and subcortical gliosis at the occipital lobe. Two remote lacunar infarcts noted in the right caudate head with mild periventricular small vessel ischemic gliosis. There is atrophy with a mild posterior predilection, stable from 2013.  IMPRESSION: 1. Negative for intracranial injury or fracture. 2. 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   Electronically Signed   By: Monte Fantasia M.D.   On: 10/03/2014 04:27   Dg Chest Port 1 View  10/03/2014   CLINICAL DATA:  Confusion.  EXAM: PORTABLE CHEST - 1 VIEW  COMPARISON:  None.  FINDINGS: Mild cardiomegaly. Negative aortic and hilar contours for technique. Diffuse interstitial opacity with Kerley lines. There is asymmetric density in the peripheral left chest. No pleural effusion. No pneumothorax.  IMPRESSION: 1. Mild pulmonary edema. 2. Focal opacity in the left lung, pneumonia versus asymmetric alveolar edema.   Electronically Signed   By: Monte Fantasia M.D.   On: 10/03/2014 04:50    EKG:  Orders placed or performed during the hospital encounter of 10/03/14  . ED EKG  . ED EKG    ASSESSMENT AND PLAN:  Principal Problem:   Sepsis due to pneumonia Active Problems:   Severe anemia   GI bleed   Elevated troponin   DM (diabetes mellitus)   HTN (hypertension) 1. Acute respiratory failure with hypoxia due to acute pulmonary edema and acidosis, metabolic, continue BiPAP initiated on bicarbonate. Following patient's ABGs shortly and making decisions about intubation if needed. Pulmicort consultation was requested. Continue Lasix IV following  urine output closely 2. Pulmonary edema due to blood transfusion as well as fluid resuscitation, nitroglycerin topically, as well as Lasix IV following urine output closely. Getting echocardiogram 3. Suspected sepsis due to pneumonia , the patient is to continue levofloxacin, following sputum cultures if available 4. Severe anemia of unclear etiology at this time. Patient is being transfused, continue transfusion when patient's acute pulmonary edema is treated 5. Elevated troponin, likely non-Q-wave MI, type II due to demand ischemia, no aspirin or heparin due to severe anemia and concerns of bleeding. Patient will be initiated  on nitroglycerin topically and metoprolol when the patient's clinical condition allows. Getting echocardiogram, cardiac consultation is appreciated. Discussed with cardiologist 6. Leukocytosis, likely due to sepsis, pneumonia. Continue antibiotic therapy. Follow white blood cell count in the morning  Management plans discussed with the patient, family and they are in agreement.   DRUG ALLERGIES:  Allergies  Allergen Reactions  . Sulfa Antibiotics Itching and Hives  . Amoxicillin Hives  . Ampicillin Hives  . Keflet [Cephalexin] Itching  . Lisinopril Other (See Comments)  . Losartan Other (See Comments)  . Sulfamethoxazole-Trimethoprim Other (See Comments)  . Cephalosporins Hives    CODE STATUS:     Code Status Orders        Start     Ordered   10/03/14 0839  Full code   Continuous     10/03/14 0838      TOTAL CRITICAL CARE TIME TAKING CARE OF THIS PATIENT: 45 minutes.    Theodoro Grist M.D on 10/03/2014 at 12:01 PM  Between 7am to 6pm - Pager - [phone]  After 6pm go to [email or web address] - password [email or web address] [facility]  Office  [phone]  CC: Primary care physician; Isaias Cowman, MD

## 2014-10-03 NOTE — Progress Notes (Signed)
Pt remains on bipap, sitter at bedside, 2nd unit of blood transfusing, 2nd troponin 7.8- Paraschos aware, Dr Paulene Floor also updated.

## 2014-10-03 NOTE — ED Notes (Signed)
Pt coughing, sounds congested, o2sat in 80s, pt unable to breath through nose, o2 changed to NRB at 10L.  o2sat no 93

## 2014-10-04 ENCOUNTER — Inpatient Hospital Stay: Payer: Medicare Other

## 2014-10-04 DIAGNOSIS — K922 Gastrointestinal hemorrhage, unspecified: Secondary | ICD-10-CM

## 2014-10-04 DIAGNOSIS — A419 Sepsis, unspecified organism: Principal | ICD-10-CM

## 2014-10-04 DIAGNOSIS — D649 Anemia, unspecified: Secondary | ICD-10-CM

## 2014-10-04 DIAGNOSIS — E039 Hypothyroidism, unspecified: Secondary | ICD-10-CM

## 2014-10-04 DIAGNOSIS — N390 Urinary tract infection, site not specified: Secondary | ICD-10-CM

## 2014-10-04 DIAGNOSIS — H9192 Unspecified hearing loss, left ear: Secondary | ICD-10-CM

## 2014-10-04 DIAGNOSIS — I10 Essential (primary) hypertension: Secondary | ICD-10-CM

## 2014-10-04 DIAGNOSIS — E119 Type 2 diabetes mellitus without complications: Secondary | ICD-10-CM

## 2014-10-04 DIAGNOSIS — R0602 Shortness of breath: Secondary | ICD-10-CM

## 2014-10-04 DIAGNOSIS — M129 Arthropathy, unspecified: Secondary | ICD-10-CM

## 2014-10-04 DIAGNOSIS — J189 Pneumonia, unspecified organism: Secondary | ICD-10-CM

## 2014-10-04 DIAGNOSIS — R5383 Other fatigue: Secondary | ICD-10-CM

## 2014-10-04 DIAGNOSIS — Z87891 Personal history of nicotine dependence: Secondary | ICD-10-CM

## 2014-10-04 DIAGNOSIS — R079 Chest pain, unspecified: Secondary | ICD-10-CM

## 2014-10-04 DIAGNOSIS — G4730 Sleep apnea, unspecified: Secondary | ICD-10-CM

## 2014-10-04 DIAGNOSIS — Z801 Family history of malignant neoplasm of trachea, bronchus and lung: Secondary | ICD-10-CM

## 2014-10-04 DIAGNOSIS — Z8673 Personal history of transient ischemic attack (TIA), and cerebral infarction without residual deficits: Secondary | ICD-10-CM

## 2014-10-04 LAB — CBC
HEMATOCRIT: 28.1 % — AB (ref 40.0–52.0)
HEMOGLOBIN: 8.2 g/dL — AB (ref 13.0–18.0)
MCH: 18.3 pg — ABNORMAL LOW (ref 26.0–34.0)
MCHC: 29.3 g/dL — ABNORMAL LOW (ref 32.0–36.0)
MCV: 62.4 fL — ABNORMAL LOW (ref 80.0–100.0)
Platelets: 457 10*3/uL — ABNORMAL HIGH (ref 150–440)
RBC: 4.51 MIL/uL (ref 4.40–5.90)
RDW: 27 % — ABNORMAL HIGH (ref 11.5–14.5)
WBC: 29.7 10*3/uL — AB (ref 3.8–10.6)

## 2014-10-04 LAB — TYPE AND SCREEN
ABO/RH(D): A POS
ANTIBODY SCREEN: NEGATIVE
UNIT DIVISION: 0
UNIT DIVISION: 0
UNIT DIVISION: 0
Unit division: 0
Unit division: 0

## 2014-10-04 LAB — GLUCOSE, CAPILLARY
GLUCOSE-CAPILLARY: 147 mg/dL — AB (ref 65–99)
GLUCOSE-CAPILLARY: 186 mg/dL — AB (ref 65–99)
Glucose-Capillary: 152 mg/dL — ABNORMAL HIGH (ref 65–99)
Glucose-Capillary: 121 mg/dL — ABNORMAL HIGH (ref 65–99)
Glucose-Capillary: 150 mg/dL — ABNORMAL HIGH (ref 65–99)

## 2014-10-04 LAB — TROPONIN I
TROPONIN I: 45.67 ng/mL — AB (ref <0.031)
TROPONIN I: 20.96 ng/mL — AB (ref <0.031)
TROPONIN I: 18.75 ng/mL — AB (ref <0.031)
Troponin I: 34.16 ng/mL — ABNORMAL HIGH (ref <0.031)

## 2014-10-04 LAB — BASIC METABOLIC PANEL
Anion gap: 11 (ref 5–15)
BUN: 14 mg/dL (ref 6–20)
CHLORIDE: 101 mmol/L (ref 101–111)
CO2: 24 mmol/L (ref 22–32)
CREATININE: 1.04 mg/dL (ref 0.61–1.24)
Calcium: 8 mg/dL — ABNORMAL LOW (ref 8.9–10.3)
Glucose, Bld: 144 mg/dL — ABNORMAL HIGH (ref 65–99)
POTASSIUM: 4 mmol/L (ref 3.5–5.1)
SODIUM: 136 mmol/L (ref 135–145)

## 2014-10-04 MED ORDER — ASPIRIN EC 81 MG PO TBEC
81.0000 mg | DELAYED_RELEASE_TABLET | Freq: Every day | ORAL | Status: DC
  Administered 2014-10-04 – 2014-10-08 (×5): 81 mg via ORAL
  Filled 2014-10-04 (×6): qty 1

## 2014-10-04 MED ORDER — LEVOTHYROXINE SODIUM 100 MCG IV SOLR
50.0000 ug | Freq: Every day | INTRAVENOUS | Status: DC
  Administered 2014-10-04 – 2014-10-07 (×4): 50 ug via INTRAVENOUS
  Filled 2014-10-04 (×5): qty 5

## 2014-10-04 MED ORDER — HEPARIN SODIUM (PORCINE) 5000 UNIT/ML IJ SOLN: 5000.0000 [IU] | Freq: Two times a day (BID) | INTRAMUSCULAR | Status: DC

## 2014-10-04 NOTE — Progress Notes (Signed)
4th troponin 45.7.  Dr. Jannifer Franklin paged and made aware.  Also informed Dr. Jannifer Franklin that Dr. Saralyn Pilar aware of previous results with no new orders.  Will continue to monitor. No new orders at this time.

## 2014-10-04 NOTE — Progress Notes (Addendum)
[name] NAME: [name]    MR#:  [id number]  DATE OF BIRTH:  1940-05-09  SUBJECTIVE:  CHIEF COMPLAINT:   Chief Complaint  Patient presents with  . Altered Mental Status  . Urinary Frequency  . Head Injury   patient as 74 year old Caucasian male who presents to the hospital with altered mental status, increased frequency of urination and fall as well as head injury. He was noted to be severely anemic with hemoglobin level of 5.4. He was started on packed red blood cell transfusion and the having frothing from the mouth, consistent with pulmonary edema, initiated on BiPAP and stabilized with diuretics , now he is off BiPAP on high flow nasal cannulas oxygen , denies significant shortness of breath. Admits of cough and sputum production of yellowish color over the past few weeks. Now on broad-spectrum antibiotics with Vanco and Zosyn as well as Zithromax.    Review of Systems  Unable to perform ROS: acuity of condition    VITAL SIGNS: Blood pressure 113/54, pulse 87, temperature 99.2 F (37.3 C), temperature source Axillary, resp. rate 28, height 5\' 8"  (1.727 m), weight 70.9 kg (156 lb 4.9 oz), SpO2 99 %.  PHYSICAL EXAMINATION:   GENERAL:  74 y.o.-year-old patient lying in the bed in mild respiratory distress, still on BiPAP early in the morning. He is pale, mildly but not as tachycardic EYES: Pupils equal, round, reactive to light and accommodation. No scleral icterus. Extraocular muscles intact.  HEENT: Head atraumatic, normocephalic. Oropharynx and nasopharynx clear.  NECK:  Supple, no jugular venous distention. No thyroid enlargement, no tenderness.  LUNGS: Better Air entrance bilaterally, but intermittent scattered wheezing, also rales,rhonchi and crepitation noted anteriorly, using accessory muscles to breathe. BiPAP mask is on the face early in the morning. Later on high flow oxygen.  CARDIOVASCULAR: S1, S2 normal.  No murmurs, rubs, or gallops. Tachycardic. 2 to 3/6 systolic murmur was heard radiating to the left axilla, rhythm was regular ABDOMEN: Soft, nontender, nondistended. Bowel sounds present. No organomegaly or mass.  EXTREMITIES: No pedal edema, cyanosis, or clubbing.  NEUROLOGIC: Cranial nerves II through XII are intact. Muscle strength 5/5 in all extremities. Sensation intact. Gait not checked.  PSYCHIATRIC: The patient is alert and oriented x 3. Opening eyes intermittently and shakes his head to answer questions. Minimally verbal due to mild shortness of breath SKIN: No obvious rash, lesion, or ulcer.   ORDERS/RESULTS REVIEWED:   CBC  Recent Labs Lab 10/03/14 0330 10/03/14 1506 10/03/14 2028 10/04/14 0352  WBC 21.6* 31.9* 34.5* 29.7*  HGB 5.4* 8.7* 8.3* 8.2*  HCT 19.6* 30.7* 29.2* 28.1*  PLT 512* 467* 459* 457*  MCV 58.6* 64.5* 63.6* 62.4*  MCH 16.1* 18.3* 18.0* 18.3*  MCHC 27.5* 28.4* 28.3* 29.3*  RDW 23.2* 27.4* 26.9* 27.0*   ------------------------------------------------------------------------------------------------------------------  Chemistries   Recent Labs Lab 10/03/14 0330 10/03/14 1506 10/04/14 0352  NA 135 137 136  K 4.6 3.9 4.0  CL 102 101 101  CO2 18* 20* 24  GLUCOSE 179* 157* 144*  BUN 18 17 14   CREATININE 0.98 1.18 1.04  CALCIUM 8.7* 8.0* 8.0*  AST 66*  --   --   ALT 16*  --   --   ALKPHOS 44  --   --   BILITOT 0.2*  --   --    ------------------------------------------------------------------------------------------------------------------ estimated creatinine clearance is 61.2 mL/min (by C-G formula based on Cr of 1.04). ------------------------------------------------------------------------------------------------------------------ No results for  input(s): TSH, T4TOTAL, T3FREE, THYROIDAB in the last 72 hours.  Invalid input(s): FREET3  Cardiac Enzymes  Recent Labs Lab 10/03/14 2028 10/04/14 0352 10/04/14 1053  TROPONINI 45.67*  34.16* 20.96*   ------------------------------------------------------------------------------------------------------------------ Invalid input(s): POCBNP ---------------------------------------------------------------------------------------------------------------  RADIOLOGY: Dg Chest 1 View  10/04/2014   CLINICAL DATA:  Confusion.  EXAM: CHEST  1 VIEW  COMPARISON:  Chest radiograph 10/03/2014  FINDINGS: Monitoring leads overlie the patient. Stable enlarged cardiac and mediastinal contours. Interval increase in diffuse bilateral interstitial pulmonary opacities with more focal consolidative opacities in the mid lungs bilaterally. Apparent lucency within the right lung apex.  IMPRESSION: There is apparent lucency within the apex of the right lung which is nonspecific and may represent artifact from positioning of soft tissues however small right apical pneumothorax is an additional consideration. Recommend correlation with decubitus views.  Interval increase in diffuse bilateral interstitial consolidative pulmonary opacities most compatible with worsening pulmonary edema. Infection could have a similar appearance.  These results were called by telephone at the time of interpretation on 10/04/2014 at 9:34 am to Dr. Laverle Hobby , who verbally acknowledged these results.   Electronically Signed   By: Lovey Newcomer M.D.   On: 10/04/2014 09:35   Ct Head Wo Contrast  10/03/2014   CLINICAL DATA:  Fallin bathroom hitting head. Initial encounter.  EXAM: CT HEAD WITHOUT CONTRAST  TECHNIQUE: Contiguous axial images were obtained from the base of the skull through the vertex without intravenous contrast.  COMPARISON:  12/14/2011  FINDINGS: Skull and Sinuses:No acute fracture.  Small left maxillary sinus, favor posttraumatic over postinflammatory.  Orbits: Left cataract resection.  No acute finding.  Brain: No evidence of acute infarction, hemorrhage, hydrocephalus, or mass lesion/mass effect.  Remote left PCA  territory infarct with extensive cortical and subcortical gliosis at the occipital lobe. Two remote lacunar infarcts noted in the right caudate head with mild periventricular small vessel ischemic gliosis. There is atrophy with a mild posterior predilection, stable from 2013.  IMPRESSION: 1. Negative for intracranial injury or fracture. 2. 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   Electronically Signed   By: Monte Fantasia M.D.   On: 10/03/2014 04:27   Dg Chest Port 1 View  10/03/2014   CLINICAL DATA:  Confusion.  EXAM: PORTABLE CHEST - 1 VIEW  COMPARISON:  None.  FINDINGS: Mild cardiomegaly. Negative aortic and hilar contours for technique. Diffuse interstitial opacity with Kerley lines. There is asymmetric density in the peripheral left chest. No pleural effusion. No pneumothorax.  IMPRESSION: 1. Mild pulmonary edema. 2. Focal opacity in the left lung, pneumonia versus asymmetric alveolar edema.   Electronically Signed   By: Monte Fantasia M.D.   On: 10/03/2014 04:50    EKG:  Orders placed or performed during the hospital encounter of 10/03/14  . ED EKG  . ED EKG    ASSESSMENT AND PLAN:  Principal Problem:   Sepsis due to pneumonia Active Problems:   Severe anemia   GI bleed   Elevated troponin   DM (diabetes mellitus)   HTN (hypertension)   Altered mental status   Anemia requiring transfusions   Community acquired pneumonia 1. Acute respiratory failure with hypoxia due to acute pulmonary edema, pneumonia and acidosis, metabolic, now off BiPAP, on high flow nasal cannulas . Pulmonary consultation is appreciated. Continue Lasix IV following urine output closely, ins and outs, still positive of approximately 1.3 L.  2. Pulmonary edema due to blood transfusion as well as fluid resuscitation, acute CHF, continue  nitroglycerin topically to facilitate the diuresis, continue Lasix IV following urine output closely. Echocardiogram shows ejection fraction of 40-45%, wall motion  abnormalities with akinesis of anterior, anteroseptal and apical wall, cardiologist is recommending diuresis and follow-up as outpatient, no cardiac catheterization at present unless the patient has chest pains.  3. Sepsis due to pneumonia , the patient is to continue vancomycin, Zosyn and Zithromax, following sputum cultures if available, blood cultures are negative so far 4. Severe anemia of unclear etiology at this time, apparently recurrent, as discussed with patient's wife. Patient was transfused with packed red blood cells, his hemoglobin level has improved and remains stable. Getting gastroenterologist involved, although no interventional studies are recommended 5. Non-Q-wave MI, type II due to demand ischemia?, Started on aspirin, Lipitor, metoprolol by cardiologist, continue nitroglycerin topically, unless severe hypotension, echocardiogram is observed, cardiac consultation is appreciated. Discussed with cardiologist today as well 6. Leukocytosis, due to sepsis, pneumonia, some improvement with therapy. Continue antibiotic therapy. Follow white blood cell count in the morning 7. Questionable pneumothorax on chest x-ray. CT scan of the chest will be obtained by pulmonologist recommendations Management plans discussed with the patient, family and they are in agreement.   DRUG ALLERGIES:  Allergies  Allergen Reactions  . Sulfa Antibiotics Itching and Hives  . Amoxicillin Hives  . Ampicillin Hives  . Keflet [Cephalexin] Itching  . Lisinopril Other (See Comments)  . Losartan Other (See Comments)  . Sulfamethoxazole-Trimethoprim Other (See Comments)  . Cephalosporins Hives    CODE STATUS:     Code Status Orders        Start     Ordered   10/03/14 0839  Full code   Continuous     10/03/14 0838      TOTAL CRITICAL CARE TIME TAKING CARE OF THIS PATIENT: 60 minutes.  Discussed with Dr. Vira Agar as well as Dr. Saralyn Pilar about patient's treatment plan Very long discussion with  patient's wife, all questions were answered. She voiced understanding. Time spent approximately 15-20 minutes for discussion with patient's wife  [name] M.D on 10/04/2014 at 11:50 AM  Between 7am to 6pm - Pager - [phone]  After 6pm go to [email or web address] - password [email or web address] [facility]  Office  [phone]  CC: Primary care physician; Isaias Cowman, MD

## 2014-10-04 NOTE — Progress Notes (Addendum)
[facility] Consultation   ASSESSMENT / PLAN:  This is a 74 year old man who was admitted to the hospital with near syncope, likely related to severe anemia of uncertain etiology. The patient has no evidence of gastric intestinal bleeding at this time, upon admission, he was transfused and then developed acute pulmonary edema and is now BiPAP dependent. His course has been complicated by an nSTEMI due to demand ischemia.  PULMONARY 1.acute respiratory failure due to pulmonary edema with the possibility of pneumonia. Slight lucency in the right apex which could be a pneumothorax. However, I suspect that this is a bleb. Plan: The patient has been started on Levaquin, which will be continued for the time being. Chest x-ray shows evidence of prior pulmonary disease -Check ABG. -Continue Lasix. -Wean down or off high flow oxygen as tolerated. The patient has been transitioned from BiPAP to high flow. His pulmonary status appears to be considerably improved today. -I will check a CT of the chest to look for evidence of a  ENDOCRINE Glucose appears to be improved today, we'll continue the patient on sliding scale insulin  CARDIOVASCULAR Acute and STEMI likely due to demand ischemia. -Cardiologist has been consulted. Continue conservative measures.  -No anticoagulation at this time due to anemia. We'll await gastroneurology recommendations regarding possibility of starting the patient on anticoagulation.  RENAL Stable kidney function. At this time.   GASTROINTESTINAL No evidence of GI bleeding at this time. gastroenterology is following.   HEMATOLOGIC Severe anemia of uncertain etiology.Hemoglobin is stable today after transfusion -Continue to transfuse as needed to keep hemoglobin above 7.  INFECTIOUS Possible pneumonia. Blood cultures are pending at this time. -Continue empiric antibiotics. -Leukocytosis-suspect that this is reactive to  infection  NEUROLOGIC - Confusion. Suspect that this is related to metabolic encephalopathy due to above problems.   GI prophylaxis. -On Protonix.  DVT prophylaxis. -Venous compression stockings.    ---------------------------------------   Name: [name] MRN: [id number] DOB: 17-Jun-1940    ADMISSION DATE:  10/03/2014 CONSULTATION DATE:  10/03/2014  REFERRING MD :  Dr. Marcial Pacas  CHIEF COMPLAINT: Dyspnea   HISTORY OF PRESENT ILLNESS:   The patient's breathing appears to be much improved today compared to yesterday. He has been transitioned off BiPAP and is currently on a high flow nasal cannula. He is awake and alert. However, he is disoriented and confused. He says his breathing could be better.  REVIEW OF SYSTEMS:   Patient is currently confused. Therefore, review of systems could not be obtained.   VITAL SIGNS: Temp:  [97.4 F (36.3 C)-99.2 F (37.3 C)] 99.2 F (37.3 C) (08/21 0700) Pulse Rate:  [84-108] 87 (08/21 1000) Resp:  [24-34] 28 (08/21 1000) BP: (100-152)/(54-83) 113/54 mmHg (08/21 1000) SpO2:  [95 %-100 %] 99 % (08/21 1000) FiO2 (%):  [60 %-80 %] 80 % (08/21 0900) Weight:  [70.9 kg (156 lb 4.9 oz)] 70.9 kg (156 lb 4.9 oz) (08/21 0438) HEMODYNAMICS:   VENTILATOR SETTINGS: Vent Mode:  [-]  FiO2 (%):  [60 %-80 %] 80 % INTAKE / OUTPUT:  Intake/Output Summary (Last 24 hours) at 10/04/14 1055 Last data filed at 10/04/14 0938  Gross per 24 hour  Intake   3020 ml  Output   1400 ml  Net   1620 ml    Physical Examination:   VS: BP 113/54 mmHg  Pulse 87  Temp(Src) 99.2 F (37.3 C) (Axillary)  Resp 28  Ht 5\' 8"  (1.727 m)  Wt 70.9 kg (156  lb 4.9 oz)  BMI 23.77 kg/m2  SpO2 99%  General Appearance: No distress  Neuro:without focal findings, mental status, speech normal, alert and oriented, cranial nerves 2-12 intact, reflexes normal and symmetric, sensation grossly normal  HEENT: PERRLA, EOM intact, no ptosis, no other lesions noticed;  Pulmonary:  Coarse breath sounds., diaphragmatic excursion      CardiovascularNormal S1,S2.  No m/r/g.    Abdomen: Benign, Soft, non-tender, No masses, hepatosplenomegaly, No lymphadenopathy Renal:  No costovertebral tenderness  GU:  Not performed at this time. Endoc: No evident thyromegaly, no signs of acromegaly. Skin:   warm, no rashes, no ecchymosis  Extremities: normal, no cyanosis, clubbing, no edema, warm with normal capillary refill.    LABS: Reviewed I did review the chest x-ray images and radiologist report. There continues to be bilateral pulmonary edema as well as evidence of chronic lung disease. There is a lucency in the right upper area which could represent lab versus small pneumothorax.  Imaging Dg Chest 1 View  10/04/2014   CLINICAL DATA:  Confusion.  EXAM: CHEST  1 VIEW  COMPARISON:  Chest radiograph 10/03/2014  FINDINGS: Monitoring leads overlie the patient. Stable enlarged cardiac and mediastinal contours. Interval increase in diffuse bilateral interstitial pulmonary opacities with more focal consolidative opacities in the mid lungs bilaterally. Apparent lucency within the right lung apex.  IMPRESSION: There is apparent lucency within the apex of the right lung which is nonspecific and may represent artifact from positioning of soft tissues however small right apical pneumothorax is an additional consideration. Recommend correlation with decubitus views.  Interval increase in diffuse bilateral interstitial consolidative pulmonary opacities most compatible with worsening pulmonary edema. Infection could have a similar appearance.  These results were called by telephone at the time of interpretation on 10/04/2014 at 9:34 am to Dr. Laverle Hobby , who verbally acknowledged these results.   Electronically Signed   By: Lovey Newcomer M.D.   On: 10/04/2014 09:35        --Marda Stalker, MD.  Board Certified in Internal Medicine, Pulmonary Medicine, [place], and Sleep  Medicine.  Pager [phone] [facility] Office Number: [phone]  Patricia Pesa, M.D.  Vilinda Boehringer, M.D.  Cheral Marker, M.D  [place].  I have personally obtained a history, examined the patient, evaluated laboratory and imaging results, formulated the assessment and plan and placed orders. The Patient requires high complexity decision making for assessment and support, frequent evaluation and titration of therapies, application of advanced monitoring technologies and extensive interpretation of multiple databases. The patient has critical illness that could lead imminently to failure of 1 or more organ systems and requires the highest level of physician preparedness to intervene.  Critical Care Time devoted to patient care services described in this note is 35 minutes and is exclusive of time spent in procedures.   10/04/2014, 10:55 AM

## 2014-10-04 NOTE — Progress Notes (Signed)
[facility]  SUBJECTIVE: I don't have chest pain   Filed Vitals:   10/04/14 0600 10/04/14 0700 10/04/14 0815 10/04/14 0900  BP: 124/57 118/54 132/57 136/56  Pulse: 89 88 94 93  Temp:  99.2 F (37.3 C)    TempSrc:  Axillary    Resp: 24 27 33 26  Height:      Weight:      SpO2: 97% 98% 100% 98%     Intake/Output Summary (Last 24 hours) at 10/04/14 1014 Last data filed at 10/04/14 5009  Gross per 24 hour  Intake   2920 ml  Output   1400 ml  Net   1520 ml      PHYSICAL EXAM  General: Well developed, well nourished, in no acute distress HEENT:  Normocephalic and atramatic Neck:  No JVD.  Lungs: Clear bilaterally to auscultation and percussion. Heart: HRRR . Normal S1 and S2 without gallops or murmurs.  Abdomen: Bowel sounds are positive, abdomen soft and non-tender  Msk:  Back normal, normal gait. Normal strength and tone for age. Extremities: No clubbing, cyanosis or edema.   Neuro: Alert and oriented X 3. Psych:  Good affect, responds appropriately   LABS: Basic Metabolic Panel:  Recent Labs  10/03/14 1506 10/04/14 0352  NA 137 136  K 3.9 4.0  CL 101 101  CO2 20* 24  GLUCOSE 157* 144*  BUN 17 14  CREATININE 1.18 1.04  CALCIUM 8.0* 8.0*   Liver Function Tests:  Recent Labs  10/03/14 0330  AST 66*  ALT 16*  ALKPHOS 44  BILITOT 0.2*  PROT 7.6  ALBUMIN 4.1   No results for input(s): LIPASE, AMYLASE in the last 72 hours. CBC:  Recent Labs  10/03/14 2028 10/04/14 0352  WBC 34.5* 29.7*  HGB 8.3* 8.2*  HCT 29.2* 28.1*  MCV 63.6* 62.4*  PLT 459* 457*   Cardiac Enzymes:  Recent Labs  10/03/14 1506 10/03/14 2028 10/04/14 0352  TROPONINI 41.28* 45.67* 34.16*   BNP: Invalid input(s): POCBNP D-Dimer: No results for input(s): DDIMER in the last 72 hours. Hemoglobin A1C: No results for input(s): HGBA1C in the last 72 hours. Fasting Lipid Panel: No results for input(s): CHOL, HDL, LDLCALC, TRIG, CHOLHDL, LDLDIRECT in the last 72  hours. Thyroid Function Tests: No results for input(s): TSH, T4TOTAL, T3FREE, THYROIDAB in the last 72 hours.  Invalid input(s): FREET3 Anemia Panel: No results for input(s): VITAMINB12, FOLATE, FERRITIN, TIBC, IRON, RETICCTPCT in the last 72 hours.  Dg Chest 1 View  10/04/2014   CLINICAL DATA:  Confusion.  EXAM: CHEST  1 VIEW  COMPARISON:  Chest radiograph 10/03/2014  FINDINGS: Monitoring leads overlie the patient. Stable enlarged cardiac and mediastinal contours. Interval increase in diffuse bilateral interstitial pulmonary opacities with more focal consolidative opacities in the mid lungs bilaterally. Apparent lucency within the right lung apex.  IMPRESSION: There is apparent lucency within the apex of the right lung which is nonspecific and may represent artifact from positioning of soft tissues however small right apical pneumothorax is an additional consideration. Recommend correlation with decubitus views.  Interval increase in diffuse bilateral interstitial consolidative pulmonary opacities most compatible with worsening pulmonary edema. Infection could have a similar appearance.  These results were called by telephone at the time of interpretation on 10/04/2014 at 9:34 am to Dr. Laverle Hobby , who verbally acknowledged these results.   Electronically Signed   By: Lovey Newcomer M.D.   On: 10/04/2014 09:35   Ct Head Wo Contrast  10/03/2014  CLINICAL DATA:  Fallin bathroom hitting head. Initial encounter.  EXAM: CT HEAD WITHOUT CONTRAST  TECHNIQUE: Contiguous axial images were obtained from the base of the skull through the vertex without intravenous contrast.  COMPARISON:  12/14/2011  FINDINGS: Skull and Sinuses:No acute fracture.  Small left maxillary sinus, favor posttraumatic over postinflammatory.  Orbits: Left cataract resection.  No acute finding.  Brain: No evidence of acute infarction, hemorrhage, hydrocephalus, or mass lesion/mass effect.  Remote left PCA territory infarct with  extensive cortical and subcortical gliosis at the occipital lobe. Two remote lacunar infarcts noted in the right caudate head with mild periventricular small vessel ischemic gliosis. There is atrophy with a mild posterior predilection, stable from 2013.  IMPRESSION: 1. Negative for intracranial injury or fracture. 2. 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   Electronically Signed   By: Monte Fantasia M.D.   On: 10/03/2014 04:27   Dg Chest Port 1 View  10/03/2014   CLINICAL DATA:  Confusion.  EXAM: PORTABLE CHEST - 1 VIEW  COMPARISON:  None.  FINDINGS: Mild cardiomegaly. Negative aortic and hilar contours for technique. Diffuse interstitial opacity with Kerley lines. There is asymmetric density in the peripheral left chest. No pleural effusion. No pneumothorax.  IMPRESSION: 1. Mild pulmonary edema. 2. Focal opacity in the left lung, pneumonia versus asymmetric alveolar edema.   Electronically Signed   By: Monte Fantasia M.D.   On: 10/03/2014 04:50     Echo mildly reduced left ventricular function,  estimated LV ejection fraction 40%, with anterior apical and septal akinesis  TELEMETRY: Normal sinus rhythm:  ASSESSMENT AND PLAN:  1. Non-ST elevation myocardial infarction, in the setting of respiratory failure, pneumonia, sepsis and severe anemia, currently without chest pain. 2. 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 Respiratory failure, pneumonia, sepsis 4. Severe anemia, of unknown etiology, improved after transfusion  Recommendations  1. Agree with overall current therapy 2. Add low-dose ACE inhibitor 3. Start low-dose aspirin 4. Defer full dose anticoagulation at this time and the absence of chest pain 5. Defer initial early invasive cardiac evaluation    Principal Problem:   Sepsis due to pneumonia Active Problems:   Severe anemia   GI bleed   Elevated troponin   DM (diabetes mellitus)   HTN (hypertension)   Altered mental status   Anemia  requiring transfusions   Community acquired pneumonia       Beadie Matsunaga, MD, PhD, [facility] 10/04/2014 10:14 AM

## 2014-10-04 NOTE — Progress Notes (Signed)
ANTIBIOTIC CONSULT NOTE - INITIAL  Pharmacy Consult for Vancomycin, Meropenem, Azithromycin Indication: rule out sepsis/PNA  Allergies  Allergen Reactions  . Sulfa Antibiotics Itching and Hives  . Amoxicillin Hives  . Ampicillin Hives  . Keflet [Cephalexin] Itching  . Lisinopril Other (See Comments)  . Losartan Other (See Comments)  . Sulfamethoxazole-Trimethoprim Other (See Comments)  . Cephalosporins Hives    Patient Measurements: Height: 5\' 8"  (172.7 cm) Weight: 156 lb 4.9 oz (70.9 kg) IBW/kg (Calculated) : 68.4 Adjusted Body Weight: n/a  Vital Signs: Temp: 98.5 F (36.9 C) (08/21 1300) Temp Source: Axillary (08/21 1300) BP: 131/60 mmHg (08/21 1500) Pulse Rate: 95 (08/21 1500)  Labs:  Recent Labs  10/03/14 0330 10/03/14 1506 10/03/14 2028 10/04/14 0352  WBC 21.6* 31.9* 34.5* 29.7*  HGB 5.4* 8.7* 8.3* 8.2*  PLT 512* 467* 459* 457*  CREATININE 0.98 1.18  --  1.04   Estimated Creatinine Clearance: 61.2 mL/min (by C-G formula based on Cr of 1.04).  Microbiology: Recent Results (from the past 720 hour(s))  Culture, blood (routine x 2)     Status: None (Preliminary result)   Collection Time: 10/03/14  4:24 AM  Result Value Ref Range Status   Specimen Description BLOOD RIGHT ARM  Final   Special Requests BOTTLES DRAWN AEROBIC AND ANAEROBIC 5CC  Final   Culture NO GROWTH 1 DAY  Final   Report Status PENDING  Incomplete  Culture, blood (routine x 2)     Status: None (Preliminary result)   Collection Time: 10/03/14  4:32 AM  Result Value Ref Range Status   Specimen Description BLOOD RIGHT ANTECUBITAL  Final   Special Requests BOTTLES DRAWN AEROBIC AND ANAEROBIC 5CC  Final   Culture NO GROWTH 1 DAY  Final   Report Status PENDING  Incomplete  Urine culture     Status: None (Preliminary result)   Collection Time: 10/03/14  4:40 AM  Result Value Ref Range Status   Specimen Description URINE, CLEAN CATCH  Final   Special Requests Normal  Final   Culture NO GROWTH  1 DAY  Final   Report Status PENDING  Incomplete  MRSA PCR Screening     Status: None   Collection Time: 10/03/14  8:42 AM  Result Value Ref Range Status   MRSA by PCR NEGATIVE NEGATIVE Final    Comment:        The GeneXpert MRSA Assay (FDA approved for NASAL specimens only), is one component of a comprehensive MRSA colonization surveillance program. It is not intended to diagnose MRSA infection nor to guide or monitor treatment for MRSA infections.     Medical History: Past Medical History  Diagnosis Date  . Diabetes mellitus without complication   . Hypertension   . Hearing loss     left ear  . TIA (transient ischemic attack)   . Hypothyroidism   . Sleep apnea   . Stroke   . Arthritis     Medications:  Anti-infectives    Start     Dose/Rate Route Frequency Ordered Stop   10/04/14 0800  azithromycin (ZITHROMAX) 500 mg in dextrose 5 % 250 mL IVPB     500 mg 250 mL/hr over 60 Minutes Intravenous Every 24 hours 10/03/14 1624     10/04/14 0500  vancomycin (VANCOCIN) 1,250 mg in sodium chloride 0.9 % 250 mL IVPB     1,250 mg 166.7 mL/hr over 90 Minutes Intravenous Every 18 hours 10/03/14 1631     10/03/14 1800  levofloxacin (LEVAQUIN) IVPB  750 mg  Status:  Discontinued     750 mg 100 mL/hr over 90 Minutes Intravenous Every 24 hours 10/03/14 0942 10/03/14 1553   10/03/14 1800  piperacillin-tazobactam (ZOSYN) IVPB 3.375 g  Status:  Discontinued     3.375 g 100 mL/hr over 30 Minutes Intravenous 4 times per day 10/03/14 1553 10/03/14 1618   10/03/14 1800  meropenem (MERREM) 1 g in sodium chloride 0.9 % 100 mL IVPB     1 g 200 mL/hr over 30 Minutes Intravenous 3 times per day 10/03/14 1618     10/03/14 1615  vancomycin (VANCOCIN) 1,250 mg in sodium chloride 0.9 % 250 mL IVPB     1,250 mg 166.7 mL/hr over 90 Minutes Intravenous STAT 10/03/14 1608 10/03/14 1753   10/03/14 1600  azithromycin (ZITHROMAX) 250 mg in dextrose 5 % 125 mL IVPB  Status:  Discontinued     250  mg 125 mL/hr over 60 Minutes Intravenous Every 24 hours 10/03/14 1553 10/03/14 1623   10/03/14 0515  levofloxacin (LEVAQUIN) IVPB 750 mg     750 mg 100 mL/hr over 90 Minutes Intravenous  Once 10/03/14 0504 10/03/14 0634     Assessment: Patient initially started on Levaquin 750mg  IV q24h for possible sepsis due to pneumonia. Patient condition declining so MD has changed antibiotics to Vancomycin, Zosyn, and Azithromycin per pharmacist. Noted patient allergies to amoxillin and ampicillin of hives. Spoke to Dr. Ether Griffins about allergy and will switch from Zosyn to Meropenem.  Goal of Therapy:  Vancomycin trough level 15-20 mcg/ml Resolution of symptoms  Plan:  Will order Meropenem 1g IV q8h. Will order Vancomycin 1250mg  IV once followed in 12hr with 1250mg  IV q18h for stacked dosing. Will check a trough level prior to 5th dose. Will order Azithromycin 500mg  IV q24h starting tomorrow, patient received Levaquin dose this morning so need to separate by 24hr to avoid potential QT issues.  Chinita Greenland PharmD Clinical Pharmacist 10/04/2014

## 2014-10-04 NOTE — Consult Note (Signed)
Pt looks much better today, on hi flow nasal cannula now. Taking clear liquids.  He has a CXR showing problems in the right lung. Pt exam shows  BP 136/56, R 26, P 94, sat 98% on oxygen. Breath sounds greatly diminished on the right, heart without murmur that I can here.  Abd non tender no HSM, bowel sounds present.   He has a history of profound anemia.  With hgb down to 3 in the past from unknown causes.  Troponin. 46.  Given his heart attack I recommend go ahead with  asprin but hold stronger anticoagulation meds at this time.  No plans for elective endoscopy or colonoscopy until at least 6 weeks out or sooner if has major bleed.

## 2014-10-04 NOTE — Progress Notes (Signed)
[facility]  Date of admission:  10/03/2014  Inpatient day:  10/04/2014  Consulting physician: Theodoro Grist, MD  Chief Complaint: [name] is a 74 y.o. male sepsis, possible pneumonia and UTI with sever anemia.  HPI: The history is predominantly provided by his wife. She states that 35-40 years ago he had severe anemia with a hemoglobin of 3. He did not require transfusion. He was treated with B12 shots. He has subsequently been on oral B12.  The patient is followed by Dr. Nadine Jensen. He was last seen on 07/2013. His last colonoscopy was about 10 years ago and EGD about 20-30 years ago.  The patient has been fatigued for an unclear amount of time. Two months ago he had a "cold". Recently he has experienced chest pain feeling like someone was sitting on his chest. He denies any radiation to his neck, jaw or arm.  He denied any nausea or vomiting.  He denies any melana or hematochezia.  Recently he noted frequent urination. He was found on the floor after hitting his head. He initially described mild shortness of breath. He denies any fever.  Initial blood Jensen occluded a hematocrit of 19.6, hemoglobin 5.4, MCV 58.6, platelets 512,000, white count 21,600 and are was 1.18. Creatinine was benign 8. Liver function tests were normal. Albumen was normal. Initial troponin was 1.32 which is subsequently increased to 45.67 today.  He was transfused with 2 units of packed red blood cells. Hematocrit increased to 29.2 with a hemoglobin of 8.3 and a white count of 34,500 yesterday.  Per nursing, he has had no guaiac positive stools.  Hematocrit is 28.1 today.  Past Medical History  Diagnosis Date  . Diabetes mellitus without complication   . Hypertension   . Hearing loss     left ear  . TIA (transient ischemic attack)   . Hypothyroidism   . Sleep apnea   . Stroke   . Arthritis     Past Surgical History  Procedure Laterality Date  . Nose surgery    . Appendectomy     . Eye surgery    . Fracture surgery      Family History  Problem Relation Age of Onset  . Hypertension Mother   . Lung cancer Father   . Hypertension Brother   Mother and sister required frequent transfusions.  Social History:  reports that he quit smoking about 31 years ago. He has never used smokeless tobacco. He reports that he drinks alcohol. He reports that he does not use illicit drugs.  He is accompanied by his wife. He is retired and previously worked in a Writer. He is able to perform all of his activities of daily living at home.  Allergies:  Allergies  Allergen Reactions  . Sulfa Antibiotics Itching and Hives  . Amoxicillin Hives  . Ampicillin Hives  . Keflet [Cephalexin] Itching  . Lisinopril Other (See Comments)  . Losartan Other (See Comments)  . Sulfamethoxazole-Trimethoprim Other (See Comments)  . Cephalosporins Hives    Medications Prior to Admission  Medication Sig Dispense Refill  . Cholecalciferol (VITAMIN D3) 1000 UNITS CAPS Take 2,000 Units by mouth.    . levothyroxine (SYNTHROID, LEVOTHROID) 100 MCG tablet Take 100 mcg by mouth every morning.    . magnesium oxide (MAG-OX) 400 MG tablet Take 400 mg by mouth at bedtime.    . metFORMIN (GLUCOPHAGE) 500 MG tablet Take 4 tablets by mouth daily.    . metFORMIN (GLUCOPHAGE) 500 MG tablet  Take 500 mg by mouth daily with breakfast.    . metFORMIN (GLUCOPHAGE) 500 MG tablet Take 1,500 mg by mouth daily with supper.    . metoprolol tartrate (LOPRESSOR) 25 MG tablet Take 12.5 mg by mouth 2 (two) times daily.     . Multiple Vitamins-Minerals (MULTI FOR HIM 50+ PO) Take 1 tablet by mouth daily.    . Omega-3 Fatty Acids (FISH OIL) 1000 MG CAPS Take 1 capsule by mouth daily.    . Red Yeast Rice 600 MG TABS Take 2 tablets by mouth daily.      Review of Systems: GENERAL:  Fatigued.  Poor energy level.  No fevers, sweats or weight loss. PERFORMANCE STATUS (ECOG):  2 HEENT:  No visual changes, runny nose, sore  throat, mouth sores or tenderness. Lungs: No shortness of breath or cough.  No hemoptysis. Cardiac:  Chest pain and pressure prior to admission.  No palpitations, orthopnea, or PND. GI:  No nausea, vomiting, diarrhea, constipation, melena or hematochezia. GU:  Urinary frequency and urgency prior to admission.  No dysuria or hematuria. Musculoskeletal:  No back pain.  No joint pain.  No muscle tenderness. Extremities:  No pain or swelling. Skin:  No rashes or skin changes. Neuro:  No headache, numbness or weakness, balance or coordination issues. Endocrine:  Diabetes.  No thyroid issues, hot flashes or night sweats. Psych:  No mood changes, depression or anxiety. Pain:  No focal pain. Review of systems:  All other systems reviewed and found to be negative.  Physical Exam:  Blood pressure 139/60, pulse 87, temperature 98.5 F (36.9 C), temperature source Axillary, resp. rate 29, height _0  (1.727 m), weight 156 lb 4.9 oz (70.9 kg), SpO2 97 %.  GENERAL:  Well developed, well nourished gentleman, lying comfortably in the ICU in no acute distress. MENTAL STATUS:  Alert and oriented to person, place and time. HEAD:  Short gray hair.  Normocephalic, atraumatic, face symmetric, no Cushingoid features. EYES:  Pupils equal round and reactive to light and accomodation.  No conjunctivitis or scleral icterus. ENT:  High flow nasal canulae. Orange tongue after eating a popsicle.  Oropharynx clear without lesion.  Tongue normal. Mucous membranes moist.  RESPIRATORY:  Crackles at the bases bilaterally.  Otherwise, clear to auscultation without rales, wheezes or rhonchi. CARDIOVASCULAR:  Regular rate and rhythm without murmur, rub or gallop. ABDOMEN:  Left upper quadrant fullness.  Soft, non-tender, with active bowel sounds, and no hepatomegaly.  No masses. SKIN:  Upper extremity ecchymosis.  No rashes, ulcers or lesions. EXTREMITIES: No edema, no skin discoloration or tenderness.  No palpable  cords. LYMPH NODES: No palpable cervical, supraclavicular, axillary or inguinal adenopathy  NEUROLOGICAL: Unremarkable. PSYCH:  Appropriate.  Results for orders placed or performed during the hospital encounter of 10/03/14 (from the past 48 hour(s))  Comprehensive metabolic panel     Status: Abnormal   Collection Time: 10/03/14  3:30 AM  Result Value Ref Range   Sodium 135 135 - 145 mmol/L   Potassium 4.6 3.5 - 5.1 mmol/L   Chloride 102 101 - 111 mmol/L   CO2 18 (L) 22 - 32 mmol/L   Glucose, Bld 179 (H) 65 - 99 mg/dL   BUN 18 6 - 20 mg/dL   Creatinine, Ser 0.98 0.61 - 1.24 mg/dL   Calcium 8.7 (L) 8.9 - 10.3 mg/dL   Total Protein 7.6 6.5 - 8.1 g/dL   Albumin 4.1 3.5 - 5.0 g/dL   AST 66 (H) 15 - 41  U/L   ALT 16 (L) 17 - 63 U/L   Alkaline Phosphatase 44 38 - 126 U/L   Total Bilirubin 0.2 (L) 0.3 - 1.2 mg/dL   GFR calc non Af Amer >60 >60 mL/min   GFR calc Af Amer >60 >60 mL/min    Comment: (NOTE) The eGFR has been calculated using the CKD EPI equation. This calculation has not been validated in all clinical situations. eGFR's persistently <60 mL/min signify possible Chronic Kidney Disease.    Anion gap 15 5 - 15  CBC     Status: Abnormal   Collection Time: 10/03/14  3:30 AM  Result Value Ref Range   WBC 21.6 (H) 3.8 - 10.6 K/uL   RBC 3.34 (L) 4.40 - 5.90 MIL/uL   Hemoglobin 5.4 (L) 13.0 - 18.0 g/dL    Comment: RESULT REPEATED AND VERIFIED   HCT 19.6 (L) 40.0 - 52.0 %    Comment: RESULT REPEATED AND VERIFIED   MCV 58.6 (L) 80.0 - 100.0 fL   MCH 16.1 (L) 26.0 - 34.0 pg   MCHC 27.5 (L) 32.0 - 36.0 g/dL   RDW 23.2 (H) 11.5 - 14.5 %   Platelets 512 (H) 150 - 440 K/uL  Troponin I     Status: Abnormal   Collection Time: 10/03/14  3:30 AM  Result Value Ref Range   Troponin I 1.32 (H) <0.031 ng/mL    Comment: READ BACK AND VERIFIED BY CHRISTINE KIM AT 0454 ON 10/03/14 RWW        POSSIBLE MYOCARDIAL ISCHEMIA. SERIAL TESTING RECOMMENDED.   Protime-INR     Status: Abnormal    Collection Time: 10/03/14  3:30 AM  Result Value Ref Range   Prothrombin Time 15.2 (H) 11.4 - 15.0 seconds   INR 1.18   Type and screen for Red Blood Exchange     Status: None   Collection Time: 10/03/14  4:07 AM  Result Value Ref Range   ABO/RH(D) A POS    Antibody Screen NEG    Sample Expiration 10/06/2014    Unit Number [id number]    Blood Component Type RED CELLS,LR    Unit division 00    Status of Unit ISSUED,FINAL    Transfusion Status OK TO TRANSFUSE    Crossmatch Result Compatible    Unit Number [id number]    Blood Component Type RBC LR PHER1    Unit division 00    Status of Unit REL FROM [facility]    Transfusion Status OK TO TRANSFUSE    Crossmatch Result Compatible    Unit Number [id number]    Blood Component Type RBC LR PHER1    Unit division 00    Status of Unit REL FROM [facility]    Transfusion Status OK TO TRANSFUSE    Crossmatch Result Compatible    Unit Number [id number]    Blood Component Type RBC LR PHER2    Unit division 00    Status of Unit REL FROM [facility]    Transfusion Status OK TO TRANSFUSE    Crossmatch Result Compatible    Unit Number [id number]    Blood Component Type RED CELLS,LR    Unit division 00    Status of Unit ISSUED,FINAL    Transfusion Status OK TO TRANSFUSE    Crossmatch Result Compatible   Prepare RBC     Status: None   Collection Time: 10/03/14  4:07 AM  Result Value Ref Range   Order Confirmation ORDER PROCESSED BY BLOOD BANK   ABO/Rh  Status: None   Collection Time: 10/03/14  4:07 AM  Result Value Ref Range   ABO/RH(D) A POS   Culture, blood (routine x 2)     Status: None (Preliminary result)   Collection Time: 10/03/14  4:24 AM  Result Value Ref Range   Specimen Description BLOOD RIGHT ARM    Special Requests BOTTLES DRAWN AEROBIC AND ANAEROBIC 5CC    Culture NO GROWTH 1 DAY    Report Status PENDING   Culture, blood (routine x 2)     Status: None (Preliminary result)   Collection Time: 10/03/14  4:32 AM   Result Value Ref Range   Specimen Description BLOOD RIGHT ANTECUBITAL    Special Requests BOTTLES DRAWN AEROBIC AND ANAEROBIC 5CC    Culture NO GROWTH 1 DAY    Report Status PENDING   Urine culture     Status: None (Preliminary result)   Collection Time: 10/03/14  4:40 AM  Result Value Ref Range   Specimen Description URINE, CLEAN CATCH    Special Requests Normal    Culture NO GROWTH 1 DAY    Report Status PENDING   Urinalysis complete, with microscopic ([facility] only)     Status: Abnormal   Collection Time: 10/03/14  4:48 AM  Result Value Ref Range   Color, Urine STRAW (A) YELLOW   APPearance CLEAR (A) CLEAR   Glucose, UA NEGATIVE NEGATIVE mg/dL   Bilirubin Urine NEGATIVE NEGATIVE   Ketones, ur 2+ (A) NEGATIVE mg/dL   Specific Gravity, Urine 1.017 1.005 - 1.030   Hgb urine dipstick NEGATIVE NEGATIVE   pH 5.0 5.0 - 8.0   Protein, ur NEGATIVE NEGATIVE mg/dL   Nitrite NEGATIVE NEGATIVE   Leukocytes, UA NEGATIVE NEGATIVE   RBC / HPF 0-5 0 - 5 RBC/hpf   WBC, UA 0-5 0 - 5 WBC/hpf   Bacteria, UA NONE SEEN NONE SEEN   Squamous Epithelial / LPF 0-5 (A) NONE SEEN   Mucous PRESENT   Blood gas, arterial ([facility] ONLY)     Status: Abnormal (Preliminary result)   Collection Time: 10/03/14  7:25 AM  Result Value Ref Range   FIO2 0.75    Delivery systems BILEVEL POSITIVE AIRWAY PRESSURE    LHR 16 resp/min   Inspiratory PAP 14    Expiratory PAP 6    pH, Arterial 7.05 (LL) 7.350 - 7.450    Comment: CRITICAL RESULT CALLED TO, READ BACK BY AND VERIFIED WITH: 10/03/14,0740,DR. SUNG    pCO2 arterial 32 32.0 - 48.0 mmHg   pO2, Arterial 181 (H) 83.0 - 108.0 mmHg   Bicarbonate 8.9 (L) 21.0 - 28.0 mEq/L   Acid-base deficit 19.9 (H) 0.0 - 2.0 mmol/L   O2 Saturation 99.1 %   Patient temperature 37.0    Collection site LEFT RADIAL    Sample type ARTERIAL DRAW    Allens test (pass/fail) POSITIVE (A) PASS   Mechanical Rate PENDING   MRSA PCR Screening     Status: None   Collection Time: 10/03/14   8:42 AM  Result Value Ref Range   MRSA by PCR NEGATIVE NEGATIVE    Comment:        The GeneXpert MRSA Assay (FDA approved for NASAL specimens only), is one component of a comprehensive MRSA colonization surveillance program. It is not intended to diagnose MRSA infection nor to guide or monitor treatment for MRSA infections.   Troponin I     Status: Abnormal   Collection Time: 10/03/14  9:46 AM  Result  Value Ref Range   Troponin I 7.83 (H) <0.031 ng/mL    Comment: RESULTS PREVIOUSLY CALLED ON 10/03/14 AT 0454        POSSIBLE MYOCARDIAL ISCHEMIA. SERIAL TESTING RECOMMENDED.   Glucose, capillary     Status: Abnormal   Collection Time: 10/03/14 11:40 AM  Result Value Ref Range   Glucose-Capillary 222 (H) 65 - 99 mg/dL  Blood gas, arterial     Status: Abnormal (Preliminary result)   Collection Time: 10/03/14  1:15 PM  Result Value Ref Range   FIO2 0.99    Delivery systems BILEVEL POSITIVE AIRWAY PRESSURE    Inspiratory PAP 14    Expiratory PAP 6.0    pH, Arterial 7.41 7.350 - 7.450   pCO2 arterial 25 (L) 32.0 - 48.0 mmHg   pO2, Arterial 258 (H) 83.0 - 108.0 mmHg   Bicarbonate 15.8 (L) 21.0 - 28.0 mEq/L   Acid-base deficit 7.1 (H) 0.0 - 2.0 mmol/L   O2 Saturation 99.9 %   Patient temperature 37.0    Collection site LEFT RADIAL    Sample type ARTERIAL DRAW    Allens test (pass/fail) POSITIVE (A) PASS   Mechanical Rate PENDING   Troponin I     Status: Abnormal   Collection Time: 10/03/14  3:06 PM  Result Value Ref Range   Troponin I 41.28 (H) <0.031 ng/mL    Comment: READ BACK AND VERIFIED WITH KATHERINE CLAYTON ON 10/03/14 AT 1611 BY [facility]        POSSIBLE MYOCARDIAL ISCHEMIA. SERIAL TESTING RECOMMENDED.   CBC     Status: Abnormal   Collection Time: 10/03/14  3:06 PM  Result Value Ref Range   WBC 31.9 (H) 3.8 - 10.6 K/uL   RBC 4.77 4.40 - 5.90 MIL/uL   Hemoglobin 8.7 (L) 13.0 - 18.0 g/dL    Comment: RESULT REPEATED AND VERIFIED   HCT 30.7 (L) 40.0 - 52.0 %   MCV  64.5 (L) 80.0 - 100.0 fL   MCH 18.3 (L) 26.0 - 34.0 pg   MCHC 28.4 (L) 32.0 - 36.0 g/dL   RDW 27.4 (H) 11.5 - 14.5 %   Platelets 467 (H) 150 - 440 K/uL  Basic metabolic panel     Status: Abnormal   Collection Time: 10/03/14  3:06 PM  Result Value Ref Range   Sodium 137 135 - 145 mmol/L   Potassium 3.9 3.5 - 5.1 mmol/L   Chloride 101 101 - 111 mmol/L   CO2 20 (L) 22 - 32 mmol/L   Glucose, Bld 157 (H) 65 - 99 mg/dL   BUN 17 6 - 20 mg/dL   Creatinine, Ser 1.18 0.61 - 1.24 mg/dL   Calcium 8.0 (L) 8.9 - 10.3 mg/dL   GFR calc non Af Amer 59 (L) >60 mL/min   GFR calc Af Amer >60 >60 mL/min    Comment: (NOTE) The eGFR has been calculated using the CKD EPI equation. This calculation has not been validated in all clinical situations. eGFR's persistently <60 mL/min signify possible Chronic Kidney Disease.    Anion gap 16 (H) 5 - 15  Glucose, capillary     Status: Abnormal   Collection Time: 10/03/14  3:45 PM  Result Value Ref Range   Glucose-Capillary 157 (H) 65 - 99 mg/dL  Glucose, capillary     Status: Abnormal   Collection Time: 10/03/14  7:58 PM  Result Value Ref Range   Glucose-Capillary 156 (H) 65 - 99 mg/dL  Troponin I  Status: Abnormal   Collection Time: 10/03/14  8:28 PM  Result Value Ref Range   Troponin I 45.67 (H) <0.031 ng/mL    Comment: RESULTS PREVIOUSLY CALLED BY [facility] AT 1611 ON 10/03/14 RWW        POSSIBLE MYOCARDIAL ISCHEMIA. SERIAL TESTING RECOMMENDED.   CBC     Status: Abnormal   Collection Time: 10/03/14  8:28 PM  Result Value Ref Range   WBC 34.5 (H) 3.8 - 10.6 K/uL   RBC 4.59 4.40 - 5.90 MIL/uL   Hemoglobin 8.3 (L) 13.0 - 18.0 g/dL    Comment: RESULT REPEATED AND VERIFIED   HCT 29.2 (L) 40.0 - 52.0 %    Comment: RESULT REPEATED AND VERIFIED   MCV 63.6 (L) 80.0 - 100.0 fL   MCH 18.0 (L) 26.0 - 34.0 pg   MCHC 28.3 (L) 32.0 - 36.0 g/dL   RDW 26.9 (H) 11.5 - 14.5 %   Platelets 459 (H) 150 - 440 K/uL  Glucose, capillary     Status: Abnormal    Collection Time: 10/03/14 11:49 PM  Result Value Ref Range   Glucose-Capillary 173 (H) 65 - 99 mg/dL  Glucose, capillary     Status: Abnormal   Collection Time: 10/04/14  3:28 AM  Result Value Ref Range   Glucose-Capillary 147 (H) 65 - 99 mg/dL   Comment 1 Notify RN   CBC     Status: Abnormal   Collection Time: 10/04/14  3:52 AM  Result Value Ref Range   WBC 29.7 (H) 3.8 - 10.6 K/uL   RBC 4.51 4.40 - 5.90 MIL/uL   Hemoglobin 8.2 (L) 13.0 - 18.0 g/dL    Comment: RESULT REPEATED AND VERIFIED   HCT 28.1 (L) 40.0 - 52.0 %    Comment: RESULT REPEATED AND VERIFIED   MCV 62.4 (L) 80.0 - 100.0 fL   MCH 18.3 (L) 26.0 - 34.0 pg   MCHC 29.3 (L) 32.0 - 36.0 g/dL   RDW 27.0 (H) 11.5 - 14.5 %   Platelets 457 (H) 150 - 440 K/uL  Basic metabolic panel     Status: Abnormal   Collection Time: 10/04/14  3:52 AM  Result Value Ref Range   Sodium 136 135 - 145 mmol/L   Potassium 4.0 3.5 - 5.1 mmol/L   Chloride 101 101 - 111 mmol/L   CO2 24 22 - 32 mmol/L   Glucose, Bld 144 (H) 65 - 99 mg/dL   BUN 14 6 - 20 mg/dL   Creatinine, Ser 1.04 0.61 - 1.24 mg/dL   Calcium 8.0 (L) 8.9 - 10.3 mg/dL   GFR calc non Af Amer >60 >60 mL/min   GFR calc Af Amer >60 >60 mL/min    Comment: (NOTE) The eGFR has been calculated using the CKD EPI equation. This calculation has not been validated in all clinical situations. eGFR's persistently <60 mL/min signify possible Chronic Kidney Disease.    Anion gap 11 5 - 15  Troponin I     Status: Abnormal   Collection Time: 10/04/14  3:52 AM  Result Value Ref Range   Troponin I 34.16 (H) <0.031 ng/mL    Comment: RESULTS PREVIOUSLY CALLED BY [facility] AT 1611 ON 10/03/14 RWW        POSSIBLE MYOCARDIAL ISCHEMIA. SERIAL TESTING RECOMMENDED.   Glucose, capillary     Status: Abnormal   Collection Time: 10/04/14  7:14 AM  Result Value Ref Range   Glucose-Capillary 152 (H) 65 - 99 mg/dL  Troponin I  Status: Abnormal   Collection Time: 10/04/14 10:53 AM  Result Value Ref  Range   Troponin I 20.96 (H) <0.031 ng/mL    Comment: RESULTS PREVIOUSLY CALLED TO KATHERINE CLAYTON AT 1611 ON 10/03/14 BY [facility]...[facility]        POSSIBLE MYOCARDIAL ISCHEMIA. SERIAL TESTING RECOMMENDED.   Glucose, capillary     Status: Abnormal   Collection Time: 10/04/14 11:09 AM  Result Value Ref Range   Glucose-Capillary 186 (H) 65 - 99 mg/dL   Dg Chest 1 View  10/04/2014   CLINICAL DATA:  Confusion.  EXAM: CHEST  1 VIEW  COMPARISON:  Chest radiograph 10/03/2014  FINDINGS: Monitoring leads overlie the patient. Stable enlarged cardiac and mediastinal contours. Interval increase in diffuse bilateral interstitial pulmonary opacities with more focal consolidative opacities in the mid lungs bilaterally. Apparent lucency within the right lung apex.  IMPRESSION: There is apparent lucency within the apex of the right lung which is nonspecific and may represent artifact from positioning of soft tissues however small right apical pneumothorax is an additional consideration. Recommend correlation with decubitus views.  Interval increase in diffuse bilateral interstitial consolidative pulmonary opacities most compatible with worsening pulmonary edema. Infection could have a similar appearance.  These results were called by telephone at the time of interpretation on 10/04/2014 at 9:34 am to Dr. Laverle Hobby , who verbally acknowledged these results.   Electronically Signed   By: Lovey Newcomer M.D.   On: 10/04/2014 09:35   Ct Head Wo Contrast  10/03/2014   CLINICAL DATA:  Fallin bathroom hitting head. Initial encounter.  EXAM: CT HEAD WITHOUT CONTRAST  TECHNIQUE: Contiguous axial images were obtained from the base of the skull through the vertex without intravenous contrast.  COMPARISON:  12/14/2011  FINDINGS: Skull and Sinuses:No acute fracture.  Small left maxillary sinus, favor posttraumatic over postinflammatory.  Orbits: Left cataract resection.  No acute finding.  Brain: No evidence of acute infarction,  hemorrhage, hydrocephalus, or mass lesion/mass effect.  Remote left PCA territory infarct with extensive cortical and subcortical gliosis at the occipital lobe. Two remote lacunar infarcts noted in the right caudate head with mild periventricular small vessel ischemic gliosis. There is atrophy with a mild posterior predilection, stable from 2013.  IMPRESSION: 1. Negative for intracranial injury or fracture. 2. 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   Electronically Signed   By: Monte Fantasia M.D.   On: 10/03/2014 04:27   Dg Chest Port 1 View  10/03/2014   CLINICAL DATA:  Confusion.  EXAM: PORTABLE CHEST - 1 VIEW  COMPARISON:  None.  FINDINGS: Mild cardiomegaly. Negative aortic and hilar contours for technique. Diffuse interstitial opacity with Kerley lines. There is asymmetric density in the peripheral left chest. No pleural effusion. No pneumothorax.  IMPRESSION: 1. Mild pulmonary edema. 2. Focal opacity in the left lung, pneumonia versus asymmetric alveolar edema.   Electronically Signed   By: Monte Fantasia M.D.   On: 10/03/2014 04:50    Assessment:  The patient is a 74 y.o. gentleman with a history of severe anemia 35 years ago treated with B12.  He was admitted following a fall with symptomatic anemia.  Labs are consistent with a myocardial infarction.  He has severe microcytic anemia likely due to iron deficiency.  His last colonoscopy was more than 10 years ago and his last EGD was 20-30 years ago.  His diet is fair.  Plan:   1.  Daily CBCs. 2.  Labs:  Ferritin, iron studies, ESR, retic,  B12, folate, TSH, SPEP, free light chains. 3.  Guaiac all stools. 4.  Anticipate EGD/colonoscopy by GI either as an inpatient or outpatient once acute issues resolved.  Thank you for allowing me to participate in [name] 's care.  I will follow him closely with you while hospitalized and after discharge in the outpatient department.  Lequita Asal, MD  10/04/2014, 2:11  PM

## 2014-10-05 DIAGNOSIS — R7989 Other specified abnormal findings of blood chemistry: Secondary | ICD-10-CM

## 2014-10-05 DIAGNOSIS — R410 Disorientation, unspecified: Secondary | ICD-10-CM

## 2014-10-05 LAB — BLOOD GAS, ARTERIAL
ALLENS TEST (PASS/FAIL): POSITIVE — AB
ALLENS TEST (PASS/FAIL): POSITIVE — AB
Acid-base deficit: 19.9 mmol/L — ABNORMAL HIGH (ref 0.0–2.0)
Acid-base deficit: 7.1 mmol/L — ABNORMAL HIGH (ref 0.0–2.0)
Bicarbonate: 8.9 mEq/L — ABNORMAL LOW (ref 21.0–28.0)
Bicarbonate: 15.8 mEq/L — ABNORMAL LOW (ref 21.0–28.0)
DELIVERY SYSTEMS: POSITIVE
DELIVERY SYSTEMS: POSITIVE
EXPIRATORY PAP: 6
Expiratory PAP: 6
FIO2: 0.75
FIO2: 0.99
INSPIRATORY PAP: 14
INSPIRATORY PAP: 14
LHR: 16 {breaths}/min
O2 SAT: 99.1 %
O2 Saturation: 99.9 %
Patient temperature: 37
Patient temperature: 37
pCO2 arterial: 32 mmHg (ref 32.0–48.0)
pCO2 arterial: 25 mmHg — ABNORMAL LOW (ref 32.0–48.0)
pH, Arterial: 7.05 — CL (ref 7.350–7.450)
pH, Arterial: 7.41 (ref 7.350–7.450)
pO2, Arterial: 181 mmHg — ABNORMAL HIGH (ref 83.0–108.0)
pO2, Arterial: 258 mmHg — ABNORMAL HIGH (ref 83.0–108.0)

## 2014-10-05 LAB — CBC WITH DIFFERENTIAL/PLATELET
Basophils Absolute: 0 10*3/uL (ref 0–0.1)
Basophils Relative: 0 %
Eosinophils Absolute: 0.1 10*3/uL (ref 0–0.7)
Eosinophils Relative: 0 %
HCT: 26.3 % — ABNORMAL LOW (ref 40.0–52.0)
Hemoglobin: 7.8 g/dL — ABNORMAL LOW (ref 13.0–18.0)
Lymphocytes Relative: 5 %
Lymphs Abs: 1.2 10*3/uL (ref 1.0–3.6)
MCH: 18.5 pg — ABNORMAL LOW (ref 26.0–34.0)
MCHC: 29.7 g/dL — ABNORMAL LOW (ref 32.0–36.0)
MCV: 62.4 fL — ABNORMAL LOW (ref 80.0–100.0)
Monocytes Absolute: 1.7 10*3/uL — ABNORMAL HIGH (ref 0.2–1.0)
Monocytes Relative: 7 %
Neutro Abs: 22.4 10*3/uL — ABNORMAL HIGH (ref 1.4–6.5)
Neutrophils Relative %: 88 %
Platelets: 462 10*3/uL — ABNORMAL HIGH (ref 150–440)
RBC: 4.21 MIL/uL — ABNORMAL LOW (ref 4.40–5.90)
RDW: 28 % — ABNORMAL HIGH (ref 11.5–14.5)
WBC: 25.3 10*3/uL — ABNORMAL HIGH (ref 3.8–10.6)

## 2014-10-05 LAB — VITAMIN B12: Vitamin B-12: 930 pg/mL — ABNORMAL HIGH (ref 180–914)

## 2014-10-05 LAB — URINE CULTURE: Special Requests: NORMAL

## 2014-10-05 LAB — IRON AND TIBC
Iron: 9 ug/dL — ABNORMAL LOW (ref 45–182)
Saturation Ratios: 2 % — ABNORMAL LOW (ref 17.9–39.5)
TIBC: 426 ug/dL (ref 250–450)
UIBC: 417 ug/dL

## 2014-10-05 LAB — GLUCOSE, CAPILLARY
GLUCOSE-CAPILLARY: 146 mg/dL — AB (ref 65–99)
GLUCOSE-CAPILLARY: 156 mg/dL — AB (ref 65–99)
GLUCOSE-CAPILLARY: 156 mg/dL — AB (ref 65–99)
GLUCOSE-CAPILLARY: 157 mg/dL — AB (ref 65–99)
GLUCOSE-CAPILLARY: 317 mg/dL — AB (ref 65–99)
Glucose-Capillary: 176 mg/dL — ABNORMAL HIGH (ref 65–99)
Glucose-Capillary: 193 mg/dL — ABNORMAL HIGH (ref 65–99)

## 2014-10-05 LAB — RETICULOCYTES
RBC.: 4.21 MIL/uL — ABNORMAL LOW (ref 4.40–5.90)
Retic Count, Absolute: 117.9 10*3/uL (ref 19.0–183.0)
Retic Ct Pct: 2.8 % (ref 0.4–3.1)

## 2014-10-05 LAB — TSH: TSH: 4.194 u[IU]/mL (ref 0.350–4.500)

## 2014-10-05 LAB — FERRITIN: Ferritin: 36 ng/mL (ref 24–336)

## 2014-10-05 LAB — FOLATE: Folate: 12.7 ng/mL (ref >5.9)

## 2014-10-05 LAB — SEDIMENTATION RATE: Sed Rate: 93 mm/hr — ABNORMAL HIGH (ref 0–16)

## 2014-10-05 MED ORDER — SODIUM CHLORIDE 3 % IN NEBU
5.0000 mL | INHALATION_SOLUTION | Freq: Once | RESPIRATORY_TRACT | Status: AC
  Administered 2014-10-05: 5 mL via RESPIRATORY_TRACT
  Filled 2014-10-05: qty 8

## 2014-10-05 NOTE — Care Management Important Message (Signed)
Important Message  Patient Details  Name: [name] MRN: [id number] Date of Birth: 07-08-1940   Medicare Important Message Given:  Yes-second notification given    Juliann Pulse A Allmond 10/05/2014, 2:10 PM

## 2014-10-05 NOTE — Progress Notes (Signed)
Induce sputum with hypertonic order acknowledged. Awaiting medication from pharmacy. Patient is sleeping at this time. Will followup when medication is received and patient is awake

## 2014-10-05 NOTE — Progress Notes (Signed)
[facility] Cardiology  SUBJECTIVE: No chest pain   Filed Vitals:   10/05/14 1200 10/05/14 1206 10/05/14 1255 10/05/14 1300  BP: 133/62  129/57 119/51  Pulse: 81  79 84  Temp:      TempSrc:      Resp: 19  23 27   Height:      Weight:      SpO2: 95% 98% 99% 99%     Intake/Output Summary (Last 24 hours) at 10/05/14 1317 Last data filed at 10/05/14 1100  Gross per 24 hour  Intake    226 ml  Output   1700 ml  Net  -1474 ml      PHYSICAL EXAM  General: Well developed, well nourished, in no acute distress HEENT:  Normocephalic and atramatic Neck:  No JVD.  Lungs: Clear bilaterally to auscultation and percussion. Heart: HRRR . Normal S1 and S2 without gallops or murmurs.  Abdomen: Bowel sounds are positive, abdomen soft and non-tender  Msk:  Back normal, normal gait. Normal strength and tone for age. Extremities: No clubbing, cyanosis or edema.   Neuro: Alert and oriented X 3. Psych:  Good affect, responds appropriately   LABS: Basic Metabolic Panel:  Recent Labs  10/03/14 1506 10/04/14 0352  NA 137 136  K 3.9 4.0  CL 101 101  CO2 20* 24  GLUCOSE 157* 144*  BUN 17 14  CREATININE 1.18 1.04  CALCIUM 8.0* 8.0*   Liver Function Tests:  Recent Labs  10/03/14 0330  AST 66*  ALT 16*  ALKPHOS 44  BILITOT 0.2*  PROT 7.6  ALBUMIN 4.1   No results for input(s): LIPASE, AMYLASE in the last 72 hours. CBC:  Recent Labs  10/04/14 0352 10/05/14 0641  WBC 29.7* 25.3*  NEUTROABS  --  22.4*  HGB 8.2* 7.8*  HCT 28.1* 26.3*  MCV 62.4* 62.4*  PLT 457* 462*   Cardiac Enzymes:  Recent Labs  10/04/14 0352 10/04/14 1053 10/04/14 1617  TROPONINI 34.16* 20.96* 18.75*   BNP: Invalid input(s): POCBNP D-Dimer: No results for input(s): DDIMER in the last 72 hours. Hemoglobin A1C: No results for input(s): HGBA1C in the last 72 hours. Fasting Lipid Panel: No results for input(s): CHOL, HDL, LDLCALC, TRIG, CHOLHDL, LDLDIRECT in the last 72 hours. Thyroid Function  Tests:  Recent Labs  10/05/14 0641  TSH 4.194   Anemia Panel:  Recent Labs  10/05/14 0641  FOLATE 12.7  FERRITIN 36  TIBC 426  IRON 9*  RETICCTPCT 2.8    Dg Chest 1 View  10/04/2014   CLINICAL DATA:  Confusion.  EXAM: CHEST  1 VIEW  COMPARISON:  Chest radiograph 10/03/2014  FINDINGS: Monitoring leads overlie the patient. Stable enlarged cardiac and mediastinal contours. Interval increase in diffuse bilateral interstitial pulmonary opacities with more focal consolidative opacities in the mid lungs bilaterally. Apparent lucency within the right lung apex.  IMPRESSION: There is apparent lucency within the apex of the right lung which is nonspecific and may represent artifact from positioning of soft tissues however small right apical pneumothorax is an additional consideration. Recommend correlation with decubitus views.  Interval increase in diffuse bilateral interstitial consolidative pulmonary opacities most compatible with worsening pulmonary edema. Infection could have a similar appearance.  These results were called by telephone at the time of interpretation on 10/04/2014 at 9:34 am to Dr. Laverle Hobby , who verbally acknowledged these results.   Electronically Signed   By: Lovey Newcomer M.D.   On: 10/04/2014 09:35   Ct Chest Wo Contrast  10/04/2014   CLINICAL DATA:  Abnormal chest x-ray with changes of pulmonary edema on clinical exam  EXAM: CT CHEST WITHOUT CONTRAST  TECHNIQUE: Multidetector CT imaging of the chest was performed following the standard protocol without IV contrast.  COMPARISON:  Film from earlier in the same day  FINDINGS: Diffuse bilateral infiltrates are noted which have worsened in the interval from the prior chest x-ray particularly in the lower lobes bilaterally. Small pleural effusions are noted. Aortic calcifications and coronary calcifications are seen. No hilar or mediastinal adenopathy is noted the visualized upper abdomen is within normal limits.  Degenerative change of the thoracic spine is noted. No other bony abnormality is seen.  IMPRESSION: Significant increase in alveolar infiltrates when compared with the prior exam. This is most noted in the lower lobes bilaterally.  No pneumothorax to account abnormality on recent chest x-ray.   Electronically Signed   By: Inez Catalina M.D.   On: 10/04/2014 15:18     Echo  Mildly reduced left ventricular function with LV ejection fraction of 40%  TELEMETRY:  Sinus rhythm:  ASSESSMENT AND PLAN:  Principal Problem:   Sepsis due to pneumonia Active Problems:   Severe anemia   GI bleed   Elevated troponin   DM (diabetes mellitus)   HTN (hypertension)   Altered mental status   Anemia requiring transfusions   Community acquired pneumonia    1.  Non ST-elevation myocardial infarction, in the setting of pneumonia and sepsis with marked anemia and probable GI bleed, currently without chest pain. 2.  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 Pneumonia/sepsis with respiratory failure 4. Marked anemia, probable GI bleed, improved after transfusion   Recommendations  1. Agree with overall current therapy 2. Defer full dose anticoagulation in the absence of chest pain 3. Defer initial early invasive cardiac management 4. Defer ACE or ARB with history of allergy   Karrisa Didio, MD, PhD, [facility] 10/05/2014 1:17 PM

## 2014-10-05 NOTE — Progress Notes (Signed)
[facility] Consultation   ASSESSMENT / PLAN:  This is a 74 year old man who was admitted to the hospital with near syncope, likely related to severe anemia of uncertain etiology. The patient has no evidence of gastric intestinal bleeding at this time, upon admission, he was transfused and then developed acute pulmonary edema and requiring BiPAP. His course has been complicated by an nSTEMI due to demand ischemia.  PULMONARY 1.acute respiratory failure due to pulmonary edema with the possibility of pneumonia. Slight lucency in the right apex which could be a pneumothorax. However, I suspect that this is a bleb. Plan: The patient has been started on Levaquin, which will be continued for the time being. Chest x-ray shows evidence of prior pulmonary disease -Check ABG. -Continue Lasix. -Wean down or off high flow oxygen as tolerated. The patient has been transitioned from BiPAP to high flow. His pulmonary status appears to be considerably improved today. - ct chest with diffuse b/l infiltratess with small effusions - cont with current abx and supportive care, mostly a reactive process.    ENDOCRINE  continue the patient on sliding scale insulin  CARDIOVASCULAR Acute and nSTEMI likely due to demand ischemia. -Cardiologist has been consulted. Continue conservative measures.  -No anticoagulation at this time due to anemia. We'll await GI recommendations regarding possibility of starting the patient on anticoagulation.  RENAL Stable kidney function. At this time.   GASTROINTESTINAL No evidence of GI bleeding at this time. gastroenterology is following.   HEMATOLOGIC Severe anemia of uncertain etiology.Hemoglobin is stable today after transfusion -Continue to transfuse as needed to keep hemoglobin above 7.  INFECTIOUS Possible pneumonia. Blood cultures are pending at this time. -Continue empiric antibiotics. -Leukocytosis-suspect that this is reactive to  infection  NEUROLOGIC - Confusion. Suspect that this is related to metabolic encephalopathy due to above problems.   GI prophylaxis. -On Protonix.  DVT prophylaxis. -Venous compression stockings.    ---------------------------------------   Name: AARYAN ESSMAN MRN: [id number] DOB: 03-Jun-1940    ADMISSION DATE:  10/03/2014 CONSULTATION DATE:  10/03/2014  REFERRING MD :  Dr. Marcial Pacas  CHIEF COMPLAINT: Dyspnea   HISTORY OF PRESENT ILLNESS:   The patient's breathing appears to be much improved today compared to yesterday. He has been transitioned off BiPAP and is currently on a high flow nasal cannula. He is awake and alert. However, he is disoriented and confused. He says his breathing could be better.  SUBJECTIVE: Wife at bedside, patient still with confusion (oriented to person but not place), HFNC now being weaned.   REVIEW OF SYSTEMS:   Patient is currently confused. Therefore, review of systems could not be obtained.   VITAL SIGNS: Pulse Rate:  [79-103] 84 (08/22 1300) Resp:  [19-31] 27 (08/22 1300) BP: (116-141)/(40-81) 119/51 mmHg (08/22 1300) SpO2:  [77 %-100 %] 99 % (08/22 1300) FiO2 (%):  [50 %-75 %] 50 % (08/22 1206) Weight:  [153 lb (69.4 kg)] 153 lb (69.4 kg) (08/22 0500) HEMODYNAMICS:   VENTILATOR SETTINGS: Vent Mode:  [-]  FiO2 (%):  [50 %-75 %] 50 % INTAKE / OUTPUT:  Intake/Output Summary (Last 24 hours) at 10/05/14 1356 Last data filed at 10/05/14 1100  Gross per 24 hour  Intake    226 ml  Output   1700 ml  Net  -1474 ml    Physical Examination:   VS: BP 119/51 mmHg  Pulse 84  Temp(Src) 98.5 F (36.9 C) (Axillary)  Resp 27  Ht 5\' 8"  (1.727 m)  Wt  153 lb (69.4 kg)  BMI 23.27 kg/m2  SpO2 99%  General Appearance: No distress  Neuro:without focal findings, mental status, speech normal, alert and oriented, cranial nerves 2-12 intact, reflexes normal and symmetric, sensation grossly normal  HEENT: PERRLA, EOM intact, no ptosis, no other  lesions noticed;  Pulmonary: Coarse breath sounds., diaphragmatic excursion mild dec, dec BS at the bases     CardiovascularNormal S1,S2.  No m/r/g.    Abdomen: Benign, Soft, non-tender, No masses, hepatosplenomegaly, No lymphadenopathy Renal:  No costovertebral tenderness  GU:  Not performed at this time. Endoc: No evident thyromegaly, no signs of acromegaly. Skin:   warm, no rashes, no ecchymosis  Extremities: normal, no cyanosis, clubbing, no edema, warm with normal capillary refill.    LABS: Reviewed  Imaging Ct Chest Wo Contrast  10/04/2014   CLINICAL DATA:  Abnormal chest x-ray with changes of pulmonary edema on clinical exam  EXAM: CT CHEST WITHOUT CONTRAST  TECHNIQUE: Multidetector CT imaging of the chest was performed following the standard protocol without IV contrast.  COMPARISON:  Film from earlier in the same day  FINDINGS: Diffuse bilateral infiltrates are noted which have worsened in the interval from the prior chest x-ray particularly in the lower lobes bilaterally. Small pleural effusions are noted. Aortic calcifications and coronary calcifications are seen. No hilar or mediastinal adenopathy is noted the visualized upper abdomen is within normal limits. Degenerative change of the thoracic spine is noted. No other bony abnormality is seen.  IMPRESSION: Significant increase in alveolar infiltrates when compared with the prior exam. This is most noted in the lower lobes bilaterally.  No pneumothorax to account abnormality on recent chest x-ray.   Electronically Signed   By: Inez Catalina M.D.   On: 10/04/2014 15:18        I have personally obtained a history, examined the patient, evaluated laboratory and imaging results, formulated the assessment and plan and placed orders. CRITICAL CARE: The patient is critically ill with multiple organ systems failure and requires high complexity decision making for assessment and support, frequent evaluation and titration of therapies,  application of advanced monitoring technologies and extensive interpretation of multiple databases. Critical Care Time devoted to patient care services described in this note is 40 minutes.    Vilinda Boehringer, MD [facility] Pager [phone] (Please enter [phone]-[phone])

## 2014-10-05 NOTE — Progress Notes (Signed)
Induce sputum performed on patient via hypertonic saline/hhn. See flowsheet. Patient in no distress. Able to tolerate treatment well. Vitals stable. Lungs essential clear. Patient able to produce small amount of cloudy thick sputum post treatment. Sample sent

## 2014-10-05 NOTE — Consult Note (Signed)
Pt with high flow nasal cannula, chest with fair air flow, abd neg.  No signs of GI bleeding at this time nor on admission.  I will sign off, reconsult me if needed.

## 2014-10-05 NOTE — Progress Notes (Signed)
ANTIBIOTIC CONSULT NOTE - FOLLOW UP  Pharmacy Consult for Vancomycin/Meropenem/Azithromycin Indication: rule out sepsis/PNA  Allergies  Allergen Reactions  . Sulfa Antibiotics Itching and Hives  . Amoxicillin Hives  . Ampicillin Hives  . Keflet [Cephalexin] Itching  . Lisinopril Other (See Comments)  . Losartan Other (See Comments)  . Sulfamethoxazole-Trimethoprim Other (See Comments)  . Cephalosporins Hives    Patient Measurements: Height: 5\' 8"  (172.7 cm) Weight: 153 lb (69.4 kg) IBW/kg (Calculated) : 68.4   Vital Signs: BP: 116/46 mmHg (08/22 1057) Pulse Rate: 91 (08/22 1057) Intake/Output from previous day: 08/21 0701 - 08/22 0700 In: 933 [P.O.:480; I.V.:3; IV Piggyback:450] Out: 2600 [Urine:2600] Intake/Output from this shift: Total I/O In: 3 [I.V.:3] Out: -   Labs:  Recent Labs  10/03/14 0330 10/03/14 1506 10/03/14 2028 10/04/14 0352 10/05/14 0641  WBC 21.6* 31.9* 34.5* 29.7* 25.3*  HGB 5.4* 8.7* 8.3* 8.2* 7.8*  PLT 512* 467* 459* 457* 462*  CREATININE 0.98 1.18  --  1.04  --    Estimated Creatinine Clearance: 61.2 mL/min (by C-G formula based on Cr of 1.04). No results for input(s): VANCOTROUGH, VANCOPEAK, VANCORANDOM, GENTTROUGH, GENTPEAK, GENTRANDOM, TOBRATROUGH, TOBRAPEAK, TOBRARND, AMIKACINPEAK, AMIKACINTROU, AMIKACIN in the last 72 hours.   Microbiology: Recent Results (from the past 720 hour(s))  Culture, blood (routine x 2)     Status: None (Preliminary result)   Collection Time: 10/03/14  4:24 AM  Result Value Ref Range Status   Specimen Description BLOOD RIGHT ARM  Final   Special Requests BOTTLES DRAWN AEROBIC AND ANAEROBIC 5CC  Final   Culture NO GROWTH 2 DAYS  Final   Report Status PENDING  Incomplete  Culture, blood (routine x 2)     Status: None (Preliminary result)   Collection Time: 10/03/14  4:32 AM  Result Value Ref Range Status   Specimen Description BLOOD RIGHT ANTECUBITAL  Final   Special Requests BOTTLES DRAWN AEROBIC AND  ANAEROBIC 5CC  Final   Culture NO GROWTH 2 DAYS  Final   Report Status PENDING  Incomplete  Urine culture     Status: None   Collection Time: 10/03/14  4:40 AM  Result Value Ref Range Status   Specimen Description URINE, CLEAN CATCH  Final   Special Requests Normal  Final   Culture MULTIPLE SPECIES PRESENT, SUGGEST RECOLLECTION  Final   Report Status 10/05/2014 FINAL  Final  MRSA PCR Screening     Status: None   Collection Time: 10/03/14  8:42 AM  Result Value Ref Range Status   MRSA by PCR NEGATIVE NEGATIVE Final    Comment:        The GeneXpert MRSA Assay (FDA approved for NASAL specimens only), is one component of a comprehensive MRSA colonization surveillance program. It is not intended to diagnose MRSA infection nor to guide or monitor treatment for MRSA infections.     Anti-infectives    Start     Dose/Rate Route Frequency Ordered Stop   10/04/14 0800  azithromycin (ZITHROMAX) 500 mg in dextrose 5 % 250 mL IVPB     500 mg 250 mL/hr over 60 Minutes Intravenous Every 24 hours 10/03/14 1624     10/04/14 0500  vancomycin (VANCOCIN) 1,250 mg in sodium chloride 0.9 % 250 mL IVPB     1,250 mg 166.7 mL/hr over 90 Minutes Intravenous Every 18 hours 10/03/14 1631     10/03/14 1800  levofloxacin (LEVAQUIN) IVPB 750 mg  Status:  Discontinued     750 mg 100 mL/hr over 90  Minutes Intravenous Every 24 hours 10/03/14 0942 10/03/14 1553   10/03/14 1800  piperacillin-tazobactam (ZOSYN) IVPB 3.375 g  Status:  Discontinued     3.375 g 100 mL/hr over 30 Minutes Intravenous 4 times per day 10/03/14 1553 10/03/14 1618   10/03/14 1800  meropenem (MERREM) 1 g in sodium chloride 0.9 % 100 mL IVPB     1 g 200 mL/hr over 30 Minutes Intravenous 3 times per day 10/03/14 1618     10/03/14 1615  vancomycin (VANCOCIN) 1,250 mg in sodium chloride 0.9 % 250 mL IVPB     1,250 mg 166.7 mL/hr over 90 Minutes Intravenous STAT 10/03/14 1608 10/03/14 1753   10/03/14 1600  azithromycin (ZITHROMAX) 250 mg  in dextrose 5 % 125 mL IVPB  Status:  Discontinued     250 mg 125 mL/hr over 60 Minutes Intravenous Every 24 hours 10/03/14 1553 10/03/14 1623   10/03/14 0515  levofloxacin (LEVAQUIN) IVPB 750 mg     750 mg 100 mL/hr over 90 Minutes Intravenous  Once 10/03/14 0504 10/03/14 0634      Assessment: Patient initially started on Levaquin 750mg  IV q24h for possible sepsis due to pneumonia. Patient condition declining so MD has changed antibiotics to Vancomycin, Zosyn, and Azithromycin per pharmacist. Noted patient allergies to amoxillin and ampicillin of hives. Spoke to Dr. Ether Griffins about allergy and will switch from Zosyn to Meropenem.  Goal of Therapy:  Vancomycin trough level 15-20 mcg/ml  Plan:  Will continue antibiotics as ordered and check a vancomycin trough with the 5th dose.   Ulice Dash D 10/05/2014,11:01 AM

## 2014-10-05 NOTE — Progress Notes (Signed)
[facility] [place] NAME: [name]    MR#:  [id number]  DATE OF BIRTH:  09-24-1940  SUBJECTIVE:  CHIEF COMPLAINT:   Chief Complaint  Patient presents with  . Altered Mental Status  . Urinary Frequency  . Head Injury   patient as 74 year old Caucasian male who presents to the hospital with altered mental status, increased frequency of urination and fall as well as head injury. He was noted to be severely anemic with hemoglobin level of 5.4. He was started on packed red blood cell transfusion and the having frothing from the mouth, consistent with pulmonary edema, initiated on BiPAP and stabilized with diuretics , now he is off BiPAP on high flow nasal cannulas oxyge, denies significant shortness of breath. Admits of cough and sputum production of yellowish color over the past few weeks. Now on broad-spectrum antibiotics with Vanco and meropenem as well as Zithromax. Has sitter & family member at bedside. Was disoriented and confused but now very sleepy.  Review of Systems  Unable to perform ROS: acuity of condition    VITAL SIGNS: Blood pressure 131/57, pulse 87, temperature 100.6 F (38.1 C), temperature source Axillary, resp. rate 28, height 5\' 8"  (1.727 m), weight 69.4 kg (153 lb), SpO2 96 %.  PHYSICAL EXAMINATION:   GENERAL:  74 y.o.-year-old patient lying in the bed in mild respiratory distress, still on BiPAP early in the morning. He is pale, mildly but not as tachycardic EYES: Pupils equal, round, reactive to light and accommodation. No scleral icterus. Extraocular muscles intact.  HEENT: Head atraumatic, normocephalic. Oropharynx and nasopharynx clear.  NECK:  Supple, no jugular venous distention. No thyroid enlargement, no tenderness.  LUNGS: Better Air entrance bilaterally, but intermittent scattered wheezing, also rales,rhonchi and crepitation noted anteriorly, using accessory muscles to breathe. on high flow oxygen.   CARDIOVASCULAR: S1, S2 normal. No murmurs, rubs, or gallops. Tachycardic. 2 to 3/6 systolic murmur was heard radiating to the left axilla, rhythm was regular ABDOMEN: Soft, nontender, nondistended. Bowel sounds present. No organomegaly or mass.  EXTREMITIES: No pedal edema, cyanosis, or clubbing.  NEUROLOGIC: Cranial nerves II through XII are intact. Muscle strength 5/5 in all extremities. Sensation intact. Gait not checked.  PSYCHIATRIC: The patient is Sleepy Opening eyes intermittently and shakes his head to answer questions. Nonverbal for me. SKIN: No obvious rash, lesion, or ulcer.   ORDERS/RESULTS REVIEWED:   CBC  Recent Labs Lab 10/03/14 0330 10/03/14 1506 10/03/14 2028 10/04/14 0352 10/05/14 0641  WBC 21.6* 31.9* 34.5* 29.7* 25.3*  HGB 5.4* 8.7* 8.3* 8.2* 7.8*  HCT 19.6* 30.7* 29.2* 28.1* 26.3*  PLT 512* 467* 459* 457* 462*  MCV 58.6* 64.5* 63.6* 62.4* 62.4*  MCH 16.1* 18.3* 18.0* 18.3* 18.5*  MCHC 27.5* 28.4* 28.3* 29.3* 29.7*  RDW 23.2* 27.4* 26.9* 27.0* 28.0*  LYMPHSABS  --   --   --   --  1.2  MONOABS  --   --   --   --  1.7*  EOSABS  --   --   --   --  0.1  BASOSABS  --   --   --   --  0.0   ------------------------------------------------------------------------------------------------------------------  Chemistries   Recent Labs Lab 10/03/14 0330 10/03/14 1506 10/04/14 0352  NA 135 137 136  K 4.6 3.9 4.0  CL 102 101 101  CO2 18* 20* 24  GLUCOSE 179* 157* 144*  BUN 18 17 14   CREATININE 0.98 1.18 1.04  CALCIUM  8.7* 8.0* 8.0*  AST 66*  --   --   ALT 16*  --   --   ALKPHOS 44  --   --   BILITOT 0.2*  --   --    ------------------------------------------------------------------------------------------------------------------ estimated creatinine clearance is 61.2 mL/min (by C-G formula based on Cr of 1.04). ------------------------------------------------------------------------------------------------------------------  Recent Labs  10/05/14 0641   TSH 4.194    Cardiac Enzymes  Recent Labs Lab 10/04/14 0352 10/04/14 1053 10/04/14 1617  TROPONINI 34.16* 20.96* 18.75*   ------------------------------------------------------------------------------------------------------------------ Invalid input(s): POCBNP ---------------------------------------------------------------------------------------------------------------  RADIOLOGY: Dg Chest 1 View  10/04/2014   CLINICAL DATA:  Confusion.  EXAM: CHEST  1 VIEW  COMPARISON:  Chest radiograph 10/03/2014  FINDINGS: Monitoring leads overlie the patient. Stable enlarged cardiac and mediastinal contours. Interval increase in diffuse bilateral interstitial pulmonary opacities with more focal consolidative opacities in the mid lungs bilaterally. Apparent lucency within the right lung apex.  IMPRESSION: There is apparent lucency within the apex of the right lung which is nonspecific and may represent artifact from positioning of soft tissues however small right apical pneumothorax is an additional consideration. Recommend correlation with decubitus views.  Interval increase in diffuse bilateral interstitial consolidative pulmonary opacities most compatible with worsening pulmonary edema. Infection could have a similar appearance.  These results were called by telephone at the time of interpretation on 10/04/2014 at 9:34 am to Dr. Laverle Hobby , who verbally acknowledged these results.   Electronically Signed   By: Lovey Newcomer M.D.   On: 10/04/2014 09:35   Ct Chest Wo Contrast  10/04/2014   CLINICAL DATA:  Abnormal chest x-ray with changes of pulmonary edema on clinical exam  EXAM: CT CHEST WITHOUT CONTRAST  TECHNIQUE: Multidetector CT imaging of the chest was performed following the standard protocol without IV contrast.  COMPARISON:  Film from earlier in the same day  FINDINGS: Diffuse bilateral infiltrates are noted which have worsened in the interval from the prior chest x-ray particularly in  the lower lobes bilaterally. Small pleural effusions are noted. Aortic calcifications and coronary calcifications are seen. No hilar or mediastinal adenopathy is noted the visualized upper abdomen is within normal limits. Degenerative change of the thoracic spine is noted. No other bony abnormality is seen.  IMPRESSION: Significant increase in alveolar infiltrates when compared with the prior exam. This is most noted in the lower lobes bilaterally.  No pneumothorax to account abnormality on recent chest x-ray.   Electronically Signed   By: Inez Catalina M.D.   On: 10/04/2014 15:18    EKG:  Orders placed or performed during the hospital encounter of 10/03/14  . ED EKG  . ED EKG    ASSESSMENT AND PLAN:  Principal Problem:   Sepsis due to pneumonia Active Problems:   Severe anemia   GI bleed   Elevated troponin   DM (diabetes mellitus)   HTN (hypertension)   Altered mental status   Anemia requiring transfusions   Community acquired pneumonia 1. Acute respiratory failure with hypoxia due to acute pulmonary edema, pneumonia and acidosis, metabolic, now off BiPAP, on high flow nasal cannulas . Pulmonary consultation is appreciated. Continue Lasix IV following urine output closely, ins and outs, Now negative of approximately 1.5 L.   2. Pulmonary edema due to blood transfusion as well as fluid resuscitation, acute CHF, continue nitroglycerin topically to facilitate the diuresis, continue Lasix IV following urine output closely. Echocardiogram shows ejection fraction of 40-45%, wall motion abnormalities with akinesis of anterior, anteroseptal and apical wall,  cardiologist is recommending diuresis and follow-up as outpatient, no cardiac catheterization at present unless the patient has chest pains.   3. Sepsis due to pneumonia , the patient is to continue vancomycin, meropenem and Zithromax, following sputum cultures if available, blood cultures are negative so far  4. Severe anemia of unclear  etiology at this time, apparently recurrent, as discussed with patient's wife. Patient was transfused with packed red blood cells, his hemoglobin level has improved and remains stable. Appreciate gastroenterologist input, although no interventional studies are recommended  5. Non-Q-wave MI, type II due to demand ischemia, continue aspirin, Lipitor, metoprolol per cardiologist, continue nitroglycerin topically, unless severe hypotension, echocardiogram is observed, cardiac consultation is appreciated.   6. Leukocytosis, due to sepsis, pneumonia, some improvement with therapy. Continue antibiotic therapy. Follow white blood cell count in the morning  7. Questionable pneumothorax on chest x-ray. pulmonologist doesn't feels - Could be bleb?  Management plans discussed with the patient, family and they are in agreement.   DRUG ALLERGIES:  Allergies  Allergen Reactions  . Sulfa Antibiotics Itching and Hives  . Amoxicillin Hives  . Ampicillin Hives  . Keflet [Cephalexin] Itching  . Lisinopril Other (See Comments)  . Losartan Other (See Comments)  . Sulfamethoxazole-Trimethoprim Other (See Comments)  . Cephalosporins Hives    CODE STATUS:     Code Status Orders        Start     Ordered   10/03/14 0839  Full code   Continuous     10/03/14 0838      TOTAL CRITICAL CARE TIME TAKING CARE OF THIS PATIENT: 35 minutes.    long discussion with patient's wife, all questions were answered. She voiced understanding. Time spent approximately 5-10 minutes for discussion with patient's wife  [facility] M.D on 10/05/2014 at 5:17 PM  Between 7am to 6pm - Pager - [phone]  After 6pm go to [email or web address] - password [email or web address]  Office  [phone]  CC: Primary care physician; Isaias Cowman, MD

## 2014-10-06 LAB — GLUCOSE, CAPILLARY
GLUCOSE-CAPILLARY: 130 mg/dL — AB (ref 65–99)
GLUCOSE-CAPILLARY: 255 mg/dL — AB (ref 65–99)
GLUCOSE-CAPILLARY: 171 mg/dL — AB (ref 65–99)
Glucose-Capillary: 135 mg/dL — ABNORMAL HIGH (ref 65–99)
Glucose-Capillary: 146 mg/dL — ABNORMAL HIGH (ref 65–99)
Glucose-Capillary: 217 mg/dL — ABNORMAL HIGH (ref 65–99)

## 2014-10-06 LAB — KAPPA/LAMBDA LIGHT CHAINS
Kappa free light chain: 19.21 mg/L (ref 3.30–19.40)
Kappa, lambda light chain ratio: 1.35 (ref 0.26–1.65)
Lambda free light chains: 14.26 mg/L (ref 5.71–26.30)

## 2014-10-06 LAB — PROTEIN ELECTROPHORESIS, SERUM
A/G Ratio: 0.9 (ref 0.7–1.7)
Albumin ELP: 2.8 g/dL — ABNORMAL LOW (ref 2.9–4.4)
Alpha-1-Globulin: 0.4 g/dL (ref 0.0–0.4)
Alpha-2-Globulin: 1 g/dL (ref 0.4–1.0)
Beta Globulin: 1 g/dL (ref 0.7–1.3)
Gamma Globulin: 0.8 g/dL (ref 0.4–1.8)
Globulin, Total: 3.2 g/dL (ref 2.2–3.9)
Total Protein ELP: 6 g/dL (ref 6.0–8.5)

## 2014-10-06 LAB — VANCOMYCIN, TROUGH: Vancomycin Tr: 7 ug/mL — ABNORMAL LOW (ref 10–20)

## 2014-10-06 MED ORDER — VANCOMYCIN HCL 10 G IV SOLR
1250.0000 mg | Freq: Two times a day (BID) | INTRAVENOUS | Status: DC
  Administered 2014-10-06 – 2014-10-09 (×6): 1250 mg via INTRAVENOUS
  Filled 2014-10-06 (×9): qty 1250

## 2014-10-06 NOTE — Progress Notes (Signed)
ANTIBIOTIC CONSULT NOTE - FOLLOW UP  Pharmacy Consult for Vancomycin/Meropenem/Azithromycin Indication: rule out sepsis/PNA  Allergies  Allergen Reactions  . Sulfa Antibiotics Itching and Hives  . Amoxicillin Hives  . Ampicillin Hives  . Keflet [Cephalexin] Itching  . Lisinopril Other (See Comments)  . Losartan Other (See Comments)  . Sulfamethoxazole-Trimethoprim Other (See Comments)  . Cephalosporins Hives    Patient Measurements: Height: 5\' 8"  (172.7 cm) Weight: 144 lb 10 oz (65.6 kg) IBW/kg (Calculated) : 68.4   Vital Signs: Temp: 99.9 F (37.7 C) (08/23 0800) Temp Source: Oral (08/23 0800) BP: 122/56 mmHg (08/23 0951) Pulse Rate: 83 (08/23 0951) Intake/Output from previous day: 08/22 0701 - 08/23 0700 In: 606 [I.V.:6; IV Piggyback:600] Out: 2750 [Urine:2750] Intake/Output from this shift:    Labs:  Recent Labs  10/03/14 1506 10/03/14 2028 10/04/14 0352 10/05/14 0641  WBC 31.9* 34.5* 29.7* 25.3*  HGB 8.7* 8.3* 8.2* 7.8*  PLT 467* 459* 457* 462*  CREATININE 1.18  --  1.04  --    Estimated Creatinine Clearance: 58.7 mL/min (by C-G formula based on Cr of 1.04).  Recent Labs  10/06/14 1024  [place] 7*     Microbiology: Recent Results (from the past 720 hour(s))  Culture, blood (routine x 2)     Status: None (Preliminary result)   Collection Time: 10/03/14  4:24 AM  Result Value Ref Range Status   Specimen Description BLOOD RIGHT ARM  Final   Special Requests BOTTLES DRAWN AEROBIC AND ANAEROBIC 5CC  Final   Culture NO GROWTH 3 DAYS  Final   Report Status PENDING  Incomplete  Culture, blood (routine x 2)     Status: None (Preliminary result)   Collection Time: 10/03/14  4:32 AM  Result Value Ref Range Status   Specimen Description BLOOD RIGHT ANTECUBITAL  Final   Special Requests BOTTLES DRAWN AEROBIC AND ANAEROBIC 5CC  Final   Culture NO GROWTH 3 DAYS  Final   Report Status PENDING  Incomplete  Urine culture     Status: None   Collection  Time: 10/03/14  4:40 AM  Result Value Ref Range Status   Specimen Description URINE, CLEAN CATCH  Final   Special Requests Normal  Final   Culture MULTIPLE SPECIES PRESENT, SUGGEST RECOLLECTION  Final   Report Status 10/05/2014 FINAL  Final  MRSA PCR Screening     Status: None   Collection Time: 10/03/14  8:42 AM  Result Value Ref Range Status   MRSA by PCR NEGATIVE NEGATIVE Final    Comment:        The GeneXpert MRSA Assay (FDA approved for NASAL specimens only), is one component of a comprehensive MRSA colonization surveillance program. It is not intended to diagnose MRSA infection nor to guide or monitor treatment for MRSA infections.     Anti-infectives    Start     Dose/Rate Route Frequency Ordered Stop   10/06/14 2230  vancomycin (VANCOCIN) 1,250 mg in sodium chloride 0.9 % 250 mL IVPB     1,250 mg 166.7 mL/hr over 90 Minutes Intravenous Every 12 hours 10/06/14 1142     10/04/14 0800  azithromycin (ZITHROMAX) 500 mg in dextrose 5 % 250 mL IVPB     500 mg 250 mL/hr over 60 Minutes Intravenous Every 24 hours 10/03/14 1624     10/04/14 0500  vancomycin (VANCOCIN) 1,250 mg in sodium chloride 0.9 % 250 mL IVPB  Status:  Discontinued     1,250 mg 166.7 mL/hr over 90 Minutes Intravenous  Every 18 hours 10/03/14 1631 10/06/14 1142   10/03/14 1800  levofloxacin (LEVAQUIN) IVPB 750 mg  Status:  Discontinued     750 mg 100 mL/hr over 90 Minutes Intravenous Every 24 hours 10/03/14 0942 10/03/14 1553   10/03/14 1800  piperacillin-tazobactam (ZOSYN) IVPB 3.375 g  Status:  Discontinued     3.375 g 100 mL/hr over 30 Minutes Intravenous 4 times per day 10/03/14 1553 10/03/14 1618   10/03/14 1800  meropenem (MERREM) 1 g in sodium chloride 0.9 % 100 mL IVPB     1 g 200 mL/hr over 30 Minutes Intravenous 3 times per day 10/03/14 1618     10/03/14 1615  vancomycin (VANCOCIN) 1,250 mg in sodium chloride 0.9 % 250 mL IVPB     1,250 mg 166.7 mL/hr over 90 Minutes Intravenous STAT 10/03/14  1608 10/03/14 1753   10/03/14 1600  azithromycin (ZITHROMAX) 250 mg in dextrose 5 % 125 mL IVPB  Status:  Discontinued     250 mg 125 mL/hr over 60 Minutes Intravenous Every 24 hours 10/03/14 1553 10/03/14 1623   10/03/14 0515  levofloxacin (LEVAQUIN) IVPB 750 mg     750 mg 100 mL/hr over 90 Minutes Intravenous  Once 10/03/14 0504 10/03/14 0634      Assessment: Patient initially started on Levaquin 750mg  IV q24h for possible sepsis due to pneumonia. Patient condition declining so MD has changed antibiotics to Vancomycin, Zosyn, and Azithromycin per pharmacist. Noted patient allergies to amoxillin and ampicillin of hives. Spoke to Dr. Ether Griffins about allergy and will switch from Zosyn to Meropenem.  Vancomycin trough is below goal on dosing of vancomycin 1250 mg iv q 18 hours.  Cultures remain NTD.   Goal of Therapy:  Vancomycin trough level 15-20 mcg/ml  Plan:  Will increase vancomycin to 1250 mg iv q 12 hours and check a trough with the 4th dose. Will continue meropenem and azithromycin as ordered. Will continue to follow renal function and culture results. SCr ordered for tomorrow.   Ulice Dash D 10/06/2014,11:45 AM

## 2014-10-06 NOTE — Progress Notes (Signed)
Initial Nutrition Assessment    INTERVENTION:   Meals and Snacks: recommend advancing diet to at least FL as pt tolerating CL diet, interested in trying pudding, applesauce. Medical Food Supplement Therapy: if po intake inadequate on follow-up, recommend addition of nutritional supplement  NUTRITION DIAGNOSIS:   Inadequate oral intake related to acute illness as evidenced by  (CL diet).  GOAL:   Patient will meet greater than or equal to 90% of their needs  MONITOR:    (Energy Intake, Anthropmetrics, Digestive System, Electrolyte/Renal Profile, Pulmonary)  REASON FOR ASSESSMENT:   Consult Assessment of nutrition requirement/status  ASSESSMENT:    Pt admitted with acute respiratory failure due to acute pulmonary edema, currently on HFNC. Pt more alert, less confused; pt also admitted with severe anemia, no signs of active bleeding, GI signed off.    Past Medical History  Diagnosis Date  . Diabetes mellitus without complication   . Hypertension   . Hearing loss     left ear  . TIA (transient ischemic attack)   . Hypothyroidism   . Sleep apnea   . Stroke   . Arthritis      Diet Order:  Diet clear liquid Room service appropriate?: Yes; Fluid consistency:: Thin   Energy Intake: tolerating CL diet, enjoyed the citrus jello last night. Drinking fluids well. Did not eat breakfast this AM, but on visit this AM was agreeable to jello and a popsicle, Probation officer called down for order to be sent.   Food and Nutrition related history: wife reports pt has a very good appetite at home; reports he eats "2 and a half times as much as I do." Pt eats at meal times and snacks in between. Pt does not use nutritional supplements  Glucose Profile:   Recent Labs  10/06/14 0406 10/06/14 0735 10/06/14 1145  GLUCAP 130* 146* 40*   Wife reports pt most recent HgbA1c 6.2  Electrolyte and Renal Profile:  Recent Labs Lab 10/03/14 0330 10/03/14 1506 10/04/14 0352  BUN 18 17 14    CREATININE 0.98 1.18 1.04  NA 135 137 136  K 4.6 3.9 4.0   Protein Profile:   Recent Labs Lab 10/03/14 0330  ALBUMIN 4.1   Meds: lasix, ss novolog, magox, MVI  Nutrition Focused Physical Exam:  Unable to complete Nutrition-Focused physical exam at this time.    Height:   Ht Readings from Last 1 Encounters:  10/03/14 5\' 8"  (1.727 m)    Weight: wife reports pt weight has been stable at home, been stable around 162 pounds for several months now  Wt Readings from Last 1 Encounters:  10/06/14 144 lb 10 oz (65.6 kg)   Filed Weights   10/04/14 0438 10/05/14 0500 10/06/14 0419  Weight: 156 lb 4.9 oz (70.9 kg) 153 lb (69.4 kg) 144 lb 10 oz (65.6 kg)     BMI:  Body mass index is 21.99 kg/(m^2).  Estimated Nutritional Needs:   Kcal:  1960-2315 kcals (BEE 1370, 1.3 AF, 1.1-1.3 IF)   Protein:  73-86 g (1.1-1.3 g/kg)   Fluid:  1980-2310 mL (30-35 ml/kg)     MODERATE Care Level  Kerman Passey MS, RD, LDN [phone] Pager

## 2014-10-06 NOTE — Progress Notes (Signed)
[facility] Consultation   ASSESSMENT / PLAN:  This is a 74 year old man who was admitted to the hospital with near syncope, likely related to severe anemia of uncertain etiology. The patient has no evidence of gastric intestinal bleeding at this time, upon admission, he was transfused and then developed acute pulmonary edema and requiring BiPAP. His course has been complicated by an nSTEMI due to demand ischemia, he now has respiratory improvement and is on HFNC  PULMONARY 1.acute respiratory failure due to pulmonary edema with the possibility of pneumonia. Slight lucency in the right apex which could be a pneumothorax. However, I suspect that this is a bleb. Plan: The patient has been started on Levaquin, which will be continued for the time being. Chest x-ray shows evidence of prior pulmonary disease -Continue Lasix. -Wean down or off high flow oxygen as tolerated. The patient has been transitioned from BiPAP to high flow. His pulmonary status appears to be considerably improved today and he is more awake - ct chest with diffuse b/l infiltratess with small effusions - cont with current abx and supportive care, mostly a reactive process.    ENDOCRINE  continue the patient on sliding scale insulin  CARDIOVASCULAR Acute and nSTEMI likely due to demand ischemia. -Cardiologist has been consulted. Continue conservative measures.  -No anticoagulation at this time due to anemia. We'll await GI recommendations regarding possibility of starting the patient on anticoagulation.  RENAL Stable kidney function. At this time.   GASTROINTESTINAL No evidence of GI bleeding at this time. gastroenterology is following.   HEMATOLOGIC Severe anemia of uncertain etiology.Hemoglobin is stable today after transfusion -Continue to transfuse as needed to keep hemoglobin above 7.  INFECTIOUS Possible pneumonia. Blood cultures are pending at this time. -Continue empiric  antibiotics. -Leukocytosis-suspect that this is reactive to infection  NEUROLOGIC - Confusion - improving, more alert today   GI prophylaxis. -On Protonix.  DVT prophylaxis. -Venous compression stockings.   Social - great clinical improvement - PT/OT, OOB ---------------------------------------   Name: [name] MRN: [id number] DOB: 10-12-1940    ADMISSION DATE:  10/03/2014 CONSULTATION DATE:  10/03/2014  REFERRING MD :  Dr. Marcial Pacas  CHIEF COMPLAINT: Dyspnea   HISTORY OF PRESENT ILLNESS:   The patient's breathing appears to be much improved today compared to yesterday. He has been transitioned off BiPAP and is currently on a high flow nasal cannula. He is awake and alert. However, he is disoriented and confused. He says his breathing could be better.  SUBJECTIVE: Wife at bedside, patient more alert today and awake, on HFNC @35L  fio2 40%  REVIEW OF SYSTEMS:   Patient is currently confused. Therefore, review of systems could not be obtained.   VITAL SIGNS: Temp:  [98.9 F (37.2 C)-100.6 F (38.1 C)] 98.9 F (37.2 C) (08/23 1200) Pulse Rate:  [78-91] 78 (08/23 1200) Resp:  [0-29] 24 (08/23 1200) BP: (94-147)/(37-62) 129/61 mmHg (08/23 1200) SpO2:  [91 %-99 %] 96 % (08/23 1200) FiO2 (%):  [40 %-52 %] 40 % (08/23 0441) Weight:  [144 lb 10 oz (65.6 kg)] 144 lb 10 oz (65.6 kg) (08/23 0419) HEMODYNAMICS:   VENTILATOR SETTINGS: Vent Mode:  [-]  FiO2 (%):  [40 %-52 %] 40 % INTAKE / OUTPUT:  Intake/Output Summary (Last 24 hours) at 10/06/14 1304 Last data filed at 10/06/14 1302  Gross per 24 hour  Intake    253 ml  Output   3600 ml  Net  -3347 ml    Physical Examination:  VS: BP 129/61 mmHg  Pulse 78  Temp(Src) 98.9 F (37.2 C) (Oral)  Resp 24  Ht 5\' 8"  (1.727 m)  Wt 144 lb 10 oz (65.6 kg)  BMI 21.99 kg/m2  SpO2 96%  General Appearance: No distress  Neuro:without focal findings, mental status, speech normal, alert and oriented, cranial nerves 2-12  intact, reflexes normal and symmetric, sensation grossly normal  HEENT: PERRLA, EOM intact, no ptosis, no other lesions noticed;  Pulmonary: Coarse breath sounds., diaphragmatic excursion mild dec, dec BS at the bases     CardiovascularNormal S1,S2.  No m/r/g.    Abdomen: Benign, Soft, non-tender, No masses, hepatosplenomegaly, No lymphadenopathy Renal:  No costovertebral tenderness  GU:  Not performed at this time. Endoc: No evident thyromegaly, no signs of acromegaly. Skin:   warm, no rashes, no ecchymosis  Extremities: normal, no cyanosis, clubbing, no edema, warm with normal capillary refill.    LABS: Reviewed  Imaging No results found.      I have personally obtained a history, examined the patient, evaluated laboratory and imaging results, formulated the assessment and plan and placed orders. CRITICAL CARE: The patient is critically ill with multiple organ systems failure and requires high complexity decision making for assessment and support, frequent evaluation and titration of therapies, application of advanced monitoring technologies and extensive interpretation of multiple databases. Critical Care Time devoted to patient care services described in this note is 30 minutes.    Vilinda Boehringer, MD [facility] Pager [phone] (Please enter 7-digits)

## 2014-10-06 NOTE — Progress Notes (Signed)
[facility] NAME: [name]    MR#:  [id number]  DATE OF BIRTH:  1940-09-28  SUBJECTIVE:  CHIEF COMPLAINT:   Chief Complaint  Patient presents with  . Altered Mental Status  . Urinary Frequency  . Head Injury   patient as 74 year old Caucasian male who presents to the hospital with altered mental status, increased frequency of urination and fall as well as head injury. He was noted to be severely anemic with hemoglobin level of 5.4. He was started on packed red blood cell transfusion and the having frothing from the mouth, consistent with pulmonary edema, initiated on BiPAP and stabilized with diuretics , now he is off BiPAP on high flow nasal cannulas oxyge, denies significant shortness of breath. Admits of cough and sputum production of yellowish color over the past few weeks. Now on broad-spectrum antibiotics with Vanco and meropenem as well as Zithromax.  Slept Well last night, doing much better.  Per wife who is at bedside  Review of Systems  Unable to perform ROS: acuity of condition    VITAL SIGNS: Blood pressure 132/58, pulse 86, temperature 99.6 F (37.6 C), temperature source Oral, resp. rate 21, height 5\' 8"  (1.727 m), weight 65.6 kg (144 lb 10 oz), SpO2 96 %.  PHYSICAL EXAMINATION:   GENERAL:  74 y.o.-year-old patient lying in the bed in mild respiratory distress, still on BiPAP early in the morning. He is pale, mildly but not as tachycardic EYES: Pupils equal, round, reactive to light and accommodation. No scleral icterus. Extraocular muscles intact.  HEENT: Head atraumatic, normocephalic. Oropharynx and nasopharynx clear.  NECK:  Supple, no jugular venous distention. No thyroid enlargement, no tenderness.  LUNGS: Better Air entrance bilaterally, but intermittent scattered wheezing, also rales,rhonchi and crepitation noted anteriorly, using accessory muscles to breathe. on high flow oxygen.  CARDIOVASCULAR: S1,  S2 normal. No murmurs, rubs, or gallops. Tachycardic. 2 to 3/6 systolic murmur was heard radiating to the left axilla, rhythm was regular ABDOMEN: Soft, nontender, nondistended. Bowel sounds present. No organomegaly or mass.  EXTREMITIES: No pedal edema, cyanosis, or clubbing.  NEUROLOGIC: Cranial nerves II through XII are intact. Muscle strength 5/5 in all extremities. Sensation intact. Gait not checked.  PSYCHIATRIC: The patient is Sleepy Opening eyes intermittently and shakes his head to answer questions. Nonverbal for me. SKIN: No obvious rash, lesion, or ulcer.   ORDERS/RESULTS REVIEWED:   CBC  Recent Labs Lab 10/03/14 0330 10/03/14 1506 10/03/14 2028 10/04/14 0352 10/05/14 0641  WBC 21.6* 31.9* 34.5* 29.7* 25.3*  HGB 5.4* 8.7* 8.3* 8.2* 7.8*  HCT 19.6* 30.7* 29.2* 28.1* 26.3*  PLT 512* 467* 459* 457* 462*  MCV 58.6* 64.5* 63.6* 62.4* 62.4*  MCH 16.1* 18.3* 18.0* 18.3* 18.5*  MCHC 27.5* 28.4* 28.3* 29.3* 29.7*  RDW 23.2* 27.4* 26.9* 27.0* 28.0*  LYMPHSABS  --   --   --   --  1.2  MONOABS  --   --   --   --  1.7*  EOSABS  --   --   --   --  0.1  BASOSABS  --   --   --   --  0.0   ------------------------------------------------------------------------------------------------------------------  Chemistries   Recent Labs Lab 10/03/14 0330 10/03/14 1506 10/04/14 0352  NA 135 137 136  K 4.6 3.9 4.0  CL 102 101 101  CO2 18* 20* 24  GLUCOSE 179* 157* 144*  BUN 18 17 14   CREATININE 0.98 1.18 1.04  CALCIUM 8.7* 8.0* 8.0*  AST 66*  --   --   ALT 16*  --   --   ALKPHOS 44  --   --   BILITOT 0.2*  --   --    ------------------------------------------------------------------------------------------------------------------ estimated creatinine clearance is 58.7 mL/min (by C-G formula based on Cr of 1.04). ------------------------------------------------------------------------------------------------------------------  Recent Labs  10/05/14 0641  TSH 4.194     Cardiac Enzymes  Recent Labs Lab 10/04/14 0352 10/04/14 1053 10/04/14 1617  TROPONINI 34.16* 20.96* 18.75*   ASSESSMENT AND PLAN:  Principal Problem:   Sepsis due to pneumonia Active Problems:   Severe anemia   GI bleed   Elevated troponin   DM (diabetes mellitus)   HTN (hypertension)   Altered mental status   Anemia requiring transfusions   Community acquired pneumonia 1. Acute respiratory failure with hypoxia due to acute pulmonary edema, pneumonia and acidosis, metabolic, now off BiPAP, on high flow nasal cannulas . Pulmonary consultation is appreciated. Continue Lasix IV following urine output closely, ins and outs, Now negative of approximately 3.3 L.   2. Pulmonary edema due to blood transfusion as well as fluid resuscitation, acute CHF, continue nitroglycerin topically to facilitate the diuresis, continue Lasix IV following urine output closely. Echocardiogram shows ejection fraction of 40-45%, wall motion abnormalities with akinesis of anterior, anteroseptal and apical wall, cardiologist is recommending diuresis and follow-up as outpatient, no cardiac catheterization at present unless the patient has chest pains.   3. Sepsis due to pneumonia , the patient is to continue vancomycin, meropenem and Zithromax, following sputum cultures if available, blood cultures are negative so far  4. Severe anemia of unclear etiology at this time, apparently recurrent, as discussed with patient's wife. Patient was transfused with packed red blood cells, his hemoglobin level has improved and remains stable. Appreciate gastroenterologist input, although no interventional studies are recommended  5. Non-Q-wave MI, type II due to demand ischemia, continue aspirin, Lipitor, metoprolol per cardiologist, continue nitroglycerin topically, unless severe hypotension, echocardiogram is observed, cardiac consultation is appreciated.   6. Leukocytosis, due to sepsis, pneumonia, some improvement with  therapy. Continue antibiotic therapy. Follow white blood cell count in the morning  7. Questionable pneumothorax on chest x-ray. pulmonologist doesn't feels - Could be bleb?  Management plans discussed with the patient, family and they are in agreement.   DRUG ALLERGIES:  Allergies  Allergen Reactions  . Sulfa Antibiotics Itching and Hives  . Amoxicillin Hives  . Ampicillin Hives  . Keflet [Cephalexin] Itching  . Lisinopril Other (See Comments)  . Losartan Other (See Comments)  . Sulfamethoxazole-Trimethoprim Other (See Comments)  . Cephalosporins Hives    CODE STATUS:     Code Status Orders        Start     Ordered   10/03/14 0839  Full code   Continuous     10/03/14 0838      TOTAL CRITICAL CARE TIME TAKING CARE OF THIS PATIENT: 35 minutes.   Can transfer him out to the floor tomorrow if okay with critical care   long discussion with patient's wife, all questions were answered. She voiced understanding. Time spent approximately 5-10 minutes for discussion with patient's wife  [facility], Lamorris Knoblock M.D on 10/06/2014 at 3:55 PM  Between 7am to 6pm - Pager - [phone]  After 6pm go to [email or web address] - password [email or web address] [facility]  Office  [phone]  CC: Primary care physician; Isaias Cowman, MD

## 2014-10-07 LAB — CREATININE, SERUM
Creatinine, Ser: 0.74 mg/dL (ref 0.61–1.24)
GFR calc Af Amer: >60 mL/min (ref >60)
GFR calc non Af Amer: >60 mL/min (ref >60)

## 2014-10-07 LAB — GLUCOSE, CAPILLARY
GLUCOSE-CAPILLARY: 136 mg/dL — AB (ref 65–99)
GLUCOSE-CAPILLARY: 138 mg/dL — AB (ref 65–99)
GLUCOSE-CAPILLARY: 174 mg/dL — AB (ref 65–99)
GLUCOSE-CAPILLARY: 109 mg/dL — AB (ref 65–99)
Glucose-Capillary: 110 mg/dL — ABNORMAL HIGH (ref 65–99)
Glucose-Capillary: 161 mg/dL — ABNORMAL HIGH (ref 65–99)

## 2014-10-07 MED ORDER — SENNOSIDES-DOCUSATE SODIUM 8.6-50 MG PO TABS: 1.0000 | ORAL_TABLET | Freq: Two times a day (BID) | ORAL | Status: DC | PRN

## 2014-10-07 MED ORDER — LEVOFLOXACIN IN D5W 750 MG/150ML IV SOLN
750.0000 mg | INTRAVENOUS | Status: DC
  Administered 2014-10-07 – 2014-10-08 (×2): 750 mg via INTRAVENOUS
  Filled 2014-10-07 (×4): qty 150

## 2014-10-07 MED ORDER — SODIUM CHLORIDE 3 % IN NEBU
15.0000 mL | INHALATION_SOLUTION | Freq: Once | RESPIRATORY_TRACT | Status: DC | PRN
  Filled 2014-10-07: qty 15

## 2014-10-07 MED ORDER — ATORVASTATIN CALCIUM 20 MG PO TABS: 40.0000 mg | ORAL_TABLET | Freq: Every day | ORAL | Status: DC

## 2014-10-07 MED ORDER — PANTOPRAZOLE SODIUM 40 MG PO TBEC
40.0000 mg | DELAYED_RELEASE_TABLET | Freq: Every day | ORAL | Status: DC
  Administered 2014-10-08: 40 mg via ORAL
  Filled 2014-10-07 (×2): qty 1

## 2014-10-07 NOTE — Clinical Documentation Improvement (Signed)
Internal Medicine  Can the diagnosis of CHF be further specified?    Acuity - Acute, Chronic, Acute on Chronic   Type - Systolic, Diastolic, Systolic and Diastolic  Other  Clinically Undetermined   Document any associated diagnoses/conditions   Supporting Information: Documented to have heart failure Echo report complete.  Documented with acute pulmonary edema   Please exercise your independent, professional judgment when responding. A specific answer is not anticipated or expected.   Thank You,  [place]

## 2014-10-07 NOTE — Evaluation (Signed)
Physical Therapy Evaluation Patient Details Name: [name] MRN: [id number] DOB: 1940-04-08 Today's Date: 10/07/2014   History of Present Illness  [name]  is a 74 y.o. male with a known history of diabetes mellitus type 2, hypertension, hyperlipidemia, hypothyroidism, history of CVA, sleep apnea brought in by his wife with the complaints of noticed going to the bathroom for urination multiple times and she heard a loud noise and patient was noted to be on the floor with head hitting the wall. His wife states he was alert awake but was confused and little bit and did not lose any consciousness. He was also noted to have mild shortness of breath at that time. No history of any fever, chills, chest pain, nausea, vomiting, diarrhea, abdominal pain, dysuria. No history of hematemesis or melena. Does complain of some cough ongoing for the past few days. Evaluation the ED revealed stable vital signs except for mild hypoxia with room air O2 saturations of 89% and workup revealed WBC count of 21.6, H&H 5.4/19.6, troponin 1.32. Chest x-ray mild pulmonary edema, focal opacity left lung, pneumonia versus asymmetric alveolar edema. CT head negative for any acute intracranial pathology/injury. EKG normal sinus rhythm with ventricular rate of 81 bpm, right bundle branch block. Patient was alert awake and oriented and consented for PRBC transfusions. After obtaining blood cultures patient was started on IV antibiotics-levofloxacin. Pt was admitted for anemia, acute respiratory failure due to pulmonary edema, possible PNA, and NSTEMI due to demand ischemia. Patient reports no other falls in the last 12 months except for recent. Prior to admission pt was fully ambulatory without assistive device and independent with all ADLs/IADLs.   Clinical Impression  Pt demonstrates generalized weakness requiring assistance for bed mobility and transfers. Once upright in standing pt with poor static balance requiring UE support  as well as minA+1 from therapist to prevent falling onto bed. Pt with bilateral LE buckling in standing. Pt will need SNF placement at discharge in order to return to prior level of function. Pt will benefit from skilled PT services to address deficits in strength, balance, and mobility in order to return to full function at home.     Follow Up Recommendations SNF    Equipment Recommendations  Rolling walker with 5" wheels (Further determination at SNF)    Recommendations for Other Services       Precautions / Restrictions Precautions Precautions: None Restrictions Weight Bearing Restrictions: No      Mobility  Bed Mobility Overal bed mobility: Needs Assistance Bed Mobility: Supine to Sit     Supine to sit: Mod assist     General bed mobility comments: Assist for trunk righting and pulling up with bilateral UEs. Good strength with rolling to get on bed pan  Transfers Overall transfer level: Needs assistance Equipment used: Rolling walker (2 wheeled) Transfers: Sit to/from Stand Sit to Stand: Min assist         General transfer comment: Decreased LE strength and power noted. Poor anterior weight shifting, using back of knees on bed for balance and to come to upright standing. Once standing, pt with posterior leaning requiring minA to prevent falls.  Ambulation/Gait             General Gait Details: Performed standing marches at EOB but unsafe to attempt ambulation at this time due to posterior leaning and poor balance  Stairs            Wheelchair Mobility    Modified Rankin (Stroke Patients Only)  Balance Overall balance assessment: Needs assistance   Sitting balance-Leahy Scale: Good       Standing balance-Leahy Scale: Poor                               Pertinent Vitals/Pain Pain Assessment: No/denies pain    Home Living Family/patient expects to be discharged to:: Private residence Living Arrangements:  Spouse/significant other Available Help at Discharge: Family Type of Home: House Home Access: Stairs to enter Entrance Stairs-Rails: None Entrance Stairs-Number of Steps: 3 (2+1) Home Layout: One level Home Equipment: Cane - single point (no rolling walker, BSC, or shower chair)      Prior Function Level of Independence: Independent               Hand Dominance        Extremity/Trunk Assessment   Upper Extremity Assessment: Overall WFL for tasks assessed (Decreased R shoulder flexion, 4-/5 strength)           Lower Extremity Assessment: Generalized weakness (At least 4 to 4+/5 throughout)         Communication   Communication: No difficulties  Cognition Arousal/Alertness: Awake/alert Behavior During Therapy: WFL for tasks assessed/performed Overall Cognitive Status: Within Functional Limits for tasks assessed                      General Comments      Exercises Other Exercises Other Exercises: Standing marches x 10 at EOB      Assessment/Plan    PT Assessment Patient needs continued PT services  PT Diagnosis Difficulty walking;Abnormality of gait;Generalized weakness   PT Problem List Decreased strength;Decreased activity tolerance;Decreased balance;Decreased mobility;Decreased knowledge of use of DME;Decreased safety awareness  PT Treatment Interventions DME instruction;Gait training;Stair training;Therapeutic activities;Therapeutic exercise;Balance training;Neuromuscular re-education;Patient/family education   PT Goals (Current goals can be found in the Care Plan section) Acute Rehab PT Goals Patient Stated Goal: "I want to get out of bed" PT Goal Formulation: With patient/family Time For Goal Achievement: 10/21/14 Potential to Achieve Goals: Good    Frequency Min 2X/week   Barriers to discharge        Co-evaluation               End of Session Equipment Utilized During Treatment: Gait belt Activity Tolerance: Patient  tolerated treatment well Patient left: in bed;with call bell/phone within reach;with family/visitor present (on bed pan)           Time: 7793-9030 PT Time Calculation (min) (ACUTE ONLY): 38 min   Charges:   PT Evaluation $Initial PT Evaluation Tier I: 1 Procedure PT Treatments $Therapeutic Activity: 8-22 mins   PT G Codes:       Lyndel Safe Kaid Seeberger PT, DPT   Aaden Buckman 10/07/2014, 5:14 PM

## 2014-10-07 NOTE — Care Management Important Message (Signed)
Important Message  Patient Details  Name: VEDANSH KERSTETTER MRN: [id number] Date of Birth: 08-Apr-1940   Medicare Important Message Given:  Yes-third notification given    Juliann Pulse A Allmond 10/07/2014, 11:18 AM

## 2014-10-07 NOTE — Progress Notes (Signed)
[facility]  Date of admission:  10/03/2014  Inpatient day:  10/06/2014  Consulting physician: Theodoro Grist, MD  Chief Complaint: [name] is a 74 y.o. male sepsis, possible pneumonia and UTI with severe anemia.  Subjective:  Feeling better.  Denies chest pain.  Eating.  No abdominal pain.  No stool.  Past Medical History  Diagnosis Date  . Diabetes mellitus without complication   . Hypertension   . Hearing loss     left ear  . TIA (transient ischemic attack)   . Hypothyroidism   . Sleep apnea   . Stroke   . Arthritis     Past Surgical History  Procedure Laterality Date  . Nose surgery    . Appendectomy    . Eye surgery    . Fracture surgery      Family History  Problem Relation Age of Onset  . Hypertension Mother   . Lung cancer Father   . Hypertension Brother   Mother and sister required frequent transfusions.  Social History:  reports that he quit smoking about 31 years ago. He has never used smokeless tobacco. He reports that he drinks alcohol. He reports that he does not use illicit drugs.  He is accompanied by his wife. He is retired and previously worked in a Writer. He is able to perform all of his activities of daily living at home.  Allergies:  Allergies  Allergen Reactions  . Sulfa Antibiotics Itching and Hives  . Amoxicillin Hives  . Ampicillin Hives  . Keflet [Cephalexin] Itching  . Lisinopril Other (See Comments)  . Losartan Other (See Comments)  . Sulfamethoxazole-Trimethoprim Other (See Comments)  . Cephalosporins Hives    Medications Prior to Admission  Medication Sig Dispense Refill  . Cholecalciferol (VITAMIN D3) 1000 UNITS CAPS Take 2,000 Units by mouth.    . levothyroxine (SYNTHROID, LEVOTHROID) 100 MCG tablet Take 100 mcg by mouth every morning.    . magnesium oxide (MAG-OX) 400 MG tablet Take 400 mg by mouth at bedtime.    . metFORMIN (GLUCOPHAGE) 500 MG tablet Take 4 tablets by mouth daily.    .  metFORMIN (GLUCOPHAGE) 500 MG tablet Take 500 mg by mouth daily with breakfast.    . metFORMIN (GLUCOPHAGE) 500 MG tablet Take 1,500 mg by mouth daily with supper.    . metoprolol tartrate (LOPRESSOR) 25 MG tablet Take 12.5 mg by mouth 2 (two) times daily.     . Multiple Vitamins-Minerals (MULTI FOR HIM 50+ PO) Take 1 tablet by mouth daily.    . Omega-3 Fatty Acids (FISH OIL) 1000 MG CAPS Take 1 capsule by mouth daily.    . Red Yeast Rice 600 MG TABS Take 2 tablets by mouth daily.      Review of Systems: GENERAL:  Fatigued.  Poor energy level.  No fevers, sweats or weight loss. PERFORMANCE STATUS (ECOG):  2 HEENT:  No visual changes, runny nose, sore throat, mouth sores or tenderness. Lungs: Shortness of breath.  No cough.  No hemoptysis. Cardiac:  Chest pain and pressure prior to admission.  None today.  No palpitations, orthopnea, or PND. GI:  No nausea, vomiting, diarrhea, constipation, melena or hematochezia. GU:  Urinary frequency and urgency prior to admission.  Denies any current UTI symptoms. Musculoskeletal:  No back pain.  No joint pain.  No muscle tenderness. Extremities:  No pain or swelling. Skin:  No rashes or skin changes. Neuro:  No headache, numbness or weakness, balance or coordination  issues. Endocrine:  Diabetes.  No thyroid issues, hot flashes or night sweats. Psych:  No mood changes, depression or anxiety. Pain:  No focal pain. Review of systems:  All other systems reviewed and found to be negative.  Physical Exam:  Blood pressure 145/56, pulse 85, temperature 99.6 F (37.6 C), temperature source Oral, resp. rate 21, height 5\' 8"  (1.727 m), weight 144 lb 10 oz (65.6 kg), SpO2 93 %.  GENERAL:  Well developed, well nourished gentleman,  Sitting up eating (wife feeding) in the ICU in no acute distress. MENTAL STATUS:  Alert and oriented to person, place and time. HEAD:  Short gray hair.  Normocephalic, atraumatic, face symmetric, no Cushingoid features. EYES:  Pupils  equal round and reactive to light and accomodation.  No conjunctivitis or scleral icterus. ENT:  High flow nasal canulae.Oropharynx clear without lesion.  Tongue nor  mal. Mucous membranes moist.  RESPIRATORY:  Crackles at the bases bilaterally.  No wheezes. CARDIOVASCULAR:  Regular rate and rhythm without murmur, rub or gallop. ABDOMEN:  Soft, non-tender, with active bowel sounds, and no hepatomegaly.  No masses. SKIN:  Upper extremity ecchymosis.  No rashes, ulcers or lesions. EXTREMITIES: No edema, no skin discoloration or tenderness.  No palpable cords. LYMPH NODES: No palpable cervical, supraclavicular, axillary or inguinal adenopathy  NEUROLOGICAL: Unremarkable. PSYCH:  Appropriate.  Results for orders placed or performed during the hospital encounter of 10/03/14 (from the past 48 hour(s))  Glucose, capillary     Status: Abnormal   Collection Time: 10/05/14  4:08 AM  Result Value Ref Range   Glucose-Capillary 156 (H) 65 - 99 mg/dL  CBC with Differential     Status: Abnormal   Collection Time: 10/05/14  6:41 AM  Result Value Ref Range   WBC 25.3 (H) 3.8 - 10.6 K/uL   RBC 4.21 (L) 4.40 - 5.90 MIL/uL   Hemoglobin 7.8 (L) 13.0 - 18.0 g/dL   HCT 26.3 (L) 40.0 - 52.0 %   MCV 62.4 (L) 80.0 - 100.0 fL   MCH 18.5 (L) 26.0 - 34.0 pg   MCHC 29.7 (L) 32.0 - 36.0 g/dL   RDW 28.0 (H) 11.5 - 14.5 %   Platelets 462 (H) 150 - 440 K/uL   Neutrophils Relative % 88 %   Neutro Abs 22.4 (H) 1.4 - 6.5 K/uL   Lymphocytes Relative 5 %   Lymphs Abs 1.2 1.0 - 3.6 K/uL   Monocytes Relative 7 %   Monocytes Absolute 1.7 (H) 0.2 - 1.0 K/uL   Eosinophils Relative 0 %   Eosinophils Absolute 0.1 0 - 0.7 K/uL   Basophils Relative 0 %   Basophils Absolute 0.0 0 - 0.1 K/uL  Ferritin     Status: None   Collection Time: 10/05/14  6:41 AM  Result Value Ref Range   Ferritin 36 24 - 336 ng/mL  Iron and TIBC     Status: Abnormal   Collection Time: 10/05/14  6:41 AM  Result Value Ref Range   Iron 9 (L) 45 -  182 ug/dL   TIBC 426 250 - 450 ug/dL   Saturation Ratios 2 (L) 17.9 - 39.5 %   UIBC 417 ug/dL  Sedimentation rate     Status: Abnormal   Collection Time: 10/05/14  6:41 AM  Result Value Ref Range   Sed Rate 93 (H) 0 - 16 mm/hr  Reticulocytes     Status: Abnormal   Collection Time: 10/05/14  6:41 AM  Result Value Ref Range  Retic Ct Pct 2.8 0.4 - 3.1 %   RBC. 4.21 (L) 4.40 - 5.90 MIL/uL   Retic Count, Manual 117.9 19.0 - 183.0 K/uL  Vitamin B12     Status: Abnormal   Collection Time: 10/05/14  6:41 AM  Result Value Ref Range   Vitamin B-12 930 (H) 180 - 914 pg/mL    Comment: (NOTE) This assay is not validated for testing neonatal or myeloproliferative syndrome specimens for Vitamin B12 levels. Performed at [facility]   Folate     Status: None   Collection Time: 10/05/14  6:41 AM  Result Value Ref Range   Folate 12.7 >5.9 ng/mL  TSH     Status: None   Collection Time: 10/05/14  6:41 AM  Result Value Ref Range   TSH 4.194 0.350 - 4.500 uIU/mL  Protein electrophoresis, serum     Status: Abnormal   Collection Time: 10/05/14  6:41 AM  Result Value Ref Range   Total Protein ELP 6.0 6.0 - 8.5 g/dL   Albumin ELP 2.8 (L) 2.9 - 4.4 g/dL   Alpha-1-Globulin 0.4 0.0 - 0.4 g/dL   Alpha-2-Globulin 1.0 0.4 - 1.0 g/dL   Beta Globulin 1.0 0.7 - 1.3 g/dL   Gamma Globulin 0.8 0.4 - 1.8 g/dL   M-Spike, % Not Observed Not Observed g/dL   SPE Interp. Comment     Comment: (NOTE) The SPE pattern reflects hypoalbuminemia. Evidence of monoclonal protein is not apparent. Performed At: [facility] [place], [place] [id number] Lindon Romp MD RJ:[id number]    Comment Comment     Comment: (NOTE) Protein electrophoresis scan will follow via computer, mail, or courier delivery.    GLOBULIN, TOTAL 3.2 2.2 - 3.9 g/dL   A/G Ratio 0.9 0.7 - 1.7  Kappa/lambda light chains     Status: None   Collection Time: 10/05/14  6:41 AM  Result Value Ref Range   Kappa  free light chain 19.21 3.30 - 19.40 mg/L   Lamda free light chains 14.26 5.71 - 26.30 mg/L   Kappa, lamda light chain ratio 1.35 0.26 - 1.65    Comment: (NOTE) Performed At: [facility] [address] [place], [place] [id number] Lindon Romp MD TF:[id number]   Glucose, capillary     Status: Abnormal   Collection Time: 10/05/14  7:40 AM  Result Value Ref Range   Glucose-Capillary 156 (H) 65 - 99 mg/dL  Glucose, capillary     Status: Abnormal   Collection Time: 10/05/14 11:55 AM  Result Value Ref Range   Glucose-Capillary 176 (H) 65 - 99 mg/dL  Glucose, capillary     Status: Abnormal   Collection Time: 10/05/14  4:06 PM  Result Value Ref Range   Glucose-Capillary 146 (H) 65 - 99 mg/dL  Glucose, capillary     Status: Abnormal   Collection Time: 10/05/14  7:55 PM  Result Value Ref Range   Glucose-Capillary 193 (H) 65 - 99 mg/dL  Glucose, capillary     Status: Abnormal   Collection Time: 10/05/14 11:47 PM  Result Value Ref Range   Glucose-Capillary 135 (H) 65 - 99 mg/dL  Glucose, capillary     Status: Abnormal   Collection Time: 10/06/14  4:06 AM  Result Value Ref Range   Glucose-Capillary 130 (H) 65 - 99 mg/dL  Glucose, capillary     Status: Abnormal   Collection Time: 10/06/14  7:35 AM  Result Value Ref Range   Glucose-Capillary 146 (H) 65 - 99  mg/dL  Vancomycin, trough     Status: Abnormal   Collection Time: 10/06/14 10:24 AM  Result Value Ref Range   Vancomycin Tr 7 (L) 10 - 20 ug/mL  Glucose, capillary     Status: Abnormal   Collection Time: 10/06/14 11:45 AM  Result Value Ref Range   Glucose-Capillary 255 (H) 65 - 99 mg/dL  Glucose, capillary     Status: Abnormal   Collection Time: 10/06/14  3:46 PM  Result Value Ref Range   Glucose-Capillary 171 (H) 65 - 99 mg/dL  Glucose, capillary     Status: Abnormal   Collection Time: 10/06/14  8:14 PM  Result Value Ref Range   Glucose-Capillary 217 (H) 65 - 99 mg/dL  Glucose, capillary     Status: Abnormal    Collection Time: 10/07/14 12:05 AM  Result Value Ref Range   Glucose-Capillary 110 (H) 65 - 99 mg/dL  Glucose, capillary     Status: Abnormal   Collection Time: 10/07/14  3:56 AM  Result Value Ref Range   Glucose-Capillary 136 (H) 65 - 99 mg/dL   No results found.  Assessment:  The patient is a 74 y.o. gentleman with a history of severe anemia 35 years ago treated with B12.  He was admitted following a fall with symptomatic anemia.  Labs are consistent with a myocardial infarction.  Workup reveals isolated iron deficiency.  Additional labs (B12, folate, TSH, SPEP, free light chains) are normal.  His last colonoscopy was more than 10 years ago and his last EGD was 20-30 years ago.  His diet is fair.  Hematocrit appears stable.  Plan:   1.  Discussed lab results with patient and wife.  Suspect will have outpatient GI work-up in 6 weeks per GI.   2.  Transfuse as needed.   3.  Iron supplimentation as tolerated. 4.  Continue to guaiac all stools. 5.  Daily CBC.  Thank you for allowing me to participate in TERRIL CHESTNUT 's care.  I will follow him closely with you while hospitalized and after discharge in the outpatient department.  Lequita Asal, MD  10/06/2014

## 2014-10-07 NOTE — Progress Notes (Signed)
[facility] Consultation   ASSESSMENT / PLAN:  This is a 74 year old man who was admitted to the hospital with near syncope, likely related to severe anemia of uncertain etiology. The patient has no evidence of gastric intestinal bleeding at this time, upon admission, he was transfused and then developed acute pulmonary edema and requiring BiPAP. His course has been complicated by an nSTEMI due to demand ischemia, he now has respiratory improvement and is on HFNC  PULMONARY 1.acute respiratory failure due to pulmonary edema with the possibility of pneumonia. Slight lucency in the right apex on CXR, however not seen on CT chest.  Plan: The patient has been started on Levaquin, which will be continued for the time being. Chest x-ray shows evidence of prior pulmonary disease -Continue Lasix. -Wean down or off high flow oxygen as tolerated. The patient has been transitioned from BiPAP to high flow. His pulmonary status appears to be considerably improved today and he is more awake, we will continue with weaning his HFNC - incentive spirometry and flutter valve - unable to collect sputum sample, will try with hypertonic neb, will not make much of a difference at this time since he has been on antibiotics and has significant clinical improvement.  -pt/ot, out of bed to chair - ct chest with diffuse b/l infiltratess with small effusions - cont with current abx and supportive care, mostly a reactive process.    ENDOCRINE  continue the patient on sliding scale insulin  CARDIOVASCULAR Acute and nSTEMI likely due to demand ischemia. -Cardiologist has been consulted. Continue conservative measures.  -No anticoagulation at this time due to anemia. We'll await GI recommendations regarding possibility of starting the patient on anticoagulation.  RENAL Stable kidney function. At this time.   GASTROINTESTINAL No evidence of GI bleeding at this time. gastroenterology is  following.   HEMATOLOGIC Severe anemia of uncertain etiology.Hemoglobin is stable today after transfusion -Continue to transfuse as needed to keep hemoglobin above 7.  INFECTIOUS Possible pneumonia. Blood cultures are pending at this time. -Continue empiric antibiotics. -Leukocytosis-suspect that this is reactive to infection  NEUROLOGIC - Confusion - improving, more alert today  GI prophylaxis. -On Protonix.  DVT prophylaxis. -Venous compression stockings.   Social - great clinical improvement - PT/OT, OOB ---------------------------------------   Name: [name] MRN: [id number] DOB: 13-Aug-1940    ADMISSION DATE:  10/03/2014 CONSULTATION DATE:  10/03/2014  REFERRING MD :  Dr. Marcial Pacas  CHIEF COMPLAINT: Dyspnea   HISTORY OF PRESENT ILLNESS:   The patient's breathing appears to be much improved today compared to yesterday. He has been transitioned off BiPAP and is currently on a high flow nasal cannula. He is awake and alert. However, he is disoriented and confused. He says his breathing could be better.  SUBJECTIVE: Wife at bedside, patient more alert today and awake, on HFNC @35L  fio2 40%, plan to wean   REVIEW OF SYSTEMS:   ROS  HEENT - no complains CVS - no chest, no chest pressure RESP - cough (mild productive), sob (but improving) ABD - no pain MSK - no acute lesion, no joint paint   VITAL SIGNS: Temp:  [98.9 F (37.2 C)-99.7 F (37.6 C)] 99.7 F (37.6 C) (08/24 0800) Pulse Rate:  [77-88] 77 (08/24 0800) Resp:  [20-26] 20 (08/24 0800) BP: (99-145)/(36-71) 138/64 mmHg (08/24 0800) SpO2:  [92 %-99 %] 96 % (08/24 0800) FiO2 (%):  [40 %] 40 % (08/24 0336) Weight:  [152 lb 8.9 oz (69.2 kg)] 152  lb 8.9 oz (69.2 kg) (08/24 0546) HEMODYNAMICS:   VENTILATOR SETTINGS: Vent Mode:  [-]  FiO2 (%):  [40 %] 40 % INTAKE / OUTPUT:  Intake/Output Summary (Last 24 hours) at 10/07/14 1143 Last data filed at 10/07/14 0802  Gross per 24 hour  Intake    950 ml    Output   3800 ml  Net  -2850 ml    Physical Examination:   VS: BP 138/64 mmHg  Pulse 77  Temp(Src) 99.7 F (37.6 C) (Oral)  Resp 20  Ht 5\' 8"  (1.727 m)  Wt 152 lb 8.9 oz (69.2 kg)  BMI 23.20 kg/m2  SpO2 96%  General Appearance: No distress  Neuro:without focal findings, mental status, speech normal, alert and oriented, cranial nerves 2-12 intact, reflexes normal and symmetric, sensation grossly normal  HEENT: PERRLA, EOM intact, no ptosis, no other lesions noticed;  Pulmonary: Coarse breath sounds., good diaphragmatic excursion,  mild dec, dec BS at the bases (improving) CardiovascularNormal S1,S2.  No m/r/g.    Abdomen: Benign, Soft, non-tender, No masses, hepatosplenomegaly, No lymphadenopathy Renal:  No costovertebral tenderness  GU:  Not performed at this time. Endoc: No evident thyromegaly, no signs of acromegaly. Skin:   warm, no rashes, no ecchymosis  Extremities: normal, no cyanosis, clubbing, no edema, warm with normal capillary refill.    LABS: Reviewed  Imaging No results found.      I have personally obtained a history, examined the patient, evaluated laboratory and imaging results, formulated the assessment and plan and placed orders. CRITICAL CARE: The patient is critically ill with multiple organ systems failure and requires high complexity decision making for assessment and support, frequent evaluation and titration of therapies, application of advanced monitoring technologies and extensive interpretation of multiple databases. Critical Care Time devoted to patient care services described in this note is 30 minutes.    Vilinda Boehringer, MD [facility] Pager [phone] (Please enter 7-digits)

## 2014-10-07 NOTE — Progress Notes (Signed)
Pt wife aware pt moved to room 118 for safety due to pt disoriented and getting up without assistance wanting to urinate but has foley catheter.

## 2014-10-07 NOTE — Progress Notes (Signed)
ANTIBIOTIC CONSULT NOTE - FOLLOW UP  Pharmacy Consult for Vancomycin/Levaquin/Constipation Prevention Indication: rule out sepsis/PNA  Allergies  Allergen Reactions  . Sulfa Antibiotics Itching and Hives  . Amoxicillin Hives  . Ampicillin Hives  . Keflet [Cephalexin] Itching  . Lisinopril Other (See Comments)  . Losartan Other (See Comments)  . Sulfamethoxazole-Trimethoprim Other (See Comments)  . Cephalosporins Hives    Patient Measurements: Height: 5\' 8"  (172.7 cm) Weight: 152 lb 8.9 oz (69.2 kg) IBW/kg (Calculated) : 68.4   Vital Signs: Temp: 99.7 F (37.6 C) (08/24 0800) Temp Source: Oral (08/24 0800) BP: 131/62 mmHg (08/24 1151) Pulse Rate: 78 (08/24 1151) Intake/Output from previous day: 08/23 0701 - 08/24 0700 In: 900 [IV OQHUTMLYY:503] Out: 3800 [Urine:3800] Intake/Output from this shift: Total I/O In: 250 [IV Piggyback:250] Out: -   Labs:  Recent Labs  10/05/14 0641 10/07/14 0601  WBC 25.3*  --   HGB 7.8*  --   PLT 462*  --   CREATININE  --  0.74   Estimated Creatinine Clearance: 79.6 mL/min (by C-G formula based on Cr of 0.74).  Recent Labs  10/06/14 1024  Terrell 7*     Microbiology: Recent Results (from the past 720 hour(s))  Culture, blood (routine x 2)     Status: None (Preliminary result)   Collection Time: 10/03/14  4:24 AM  Result Value Ref Range Status   Specimen Description BLOOD RIGHT ARM  Final   Special Requests BOTTLES DRAWN AEROBIC AND ANAEROBIC 5CC  Final   Culture NO GROWTH 4 DAYS  Final   Report Status PENDING  Incomplete  Culture, blood (routine x 2)     Status: None (Preliminary result)   Collection Time: 10/03/14  4:32 AM  Result Value Ref Range Status   Specimen Description BLOOD RIGHT ANTECUBITAL  Final   Special Requests BOTTLES DRAWN AEROBIC AND ANAEROBIC 5CC  Final   Culture NO GROWTH 4 DAYS  Final   Report Status PENDING  Incomplete  Urine culture     Status: None   Collection Time: 10/03/14  4:40 AM   Result Value Ref Range Status   Specimen Description URINE, CLEAN CATCH  Final   Special Requests Normal  Final   Culture MULTIPLE SPECIES PRESENT, SUGGEST RECOLLECTION  Final   Report Status 10/05/2014 FINAL  Final  MRSA PCR Screening     Status: None   Collection Time: 10/03/14  8:42 AM  Result Value Ref Range Status   MRSA by PCR NEGATIVE NEGATIVE Final    Comment:        The GeneXpert MRSA Assay (FDA approved for NASAL specimens only), is one component of a comprehensive MRSA colonization surveillance program. It is not intended to diagnose MRSA infection nor to guide or monitor treatment for MRSA infections.     Anti-infectives    Start     Dose/Rate Route Frequency Ordered Stop   10/07/14 1130  levofloxacin (LEVAQUIN) IVPB 750 mg     750 mg 100 mL/hr over 90 Minutes Intravenous Every 24 hours 10/07/14 1110     10/06/14 2230  vancomycin (VANCOCIN) 1,250 mg in sodium chloride 0.9 % 250 mL IVPB     1,250 mg 166.7 mL/hr over 90 Minutes Intravenous Every 12 hours 10/06/14 1142     10/04/14 0800  azithromycin (ZITHROMAX) 500 mg in dextrose 5 % 250 mL IVPB  Status:  Discontinued     500 mg 250 mL/hr over 60 Minutes Intravenous Every 24 hours 10/03/14 1624 10/07/14 1110  10/04/14 0500  vancomycin (VANCOCIN) 1,250 mg in sodium chloride 0.9 % 250 mL IVPB  Status:  Discontinued     1,250 mg 166.7 mL/hr over 90 Minutes Intravenous Every 18 hours 10/03/14 1631 10/06/14 1142   10/03/14 1800  levofloxacin (LEVAQUIN) IVPB 750 mg  Status:  Discontinued     750 mg 100 mL/hr over 90 Minutes Intravenous Every 24 hours 10/03/14 0942 10/03/14 1553   10/03/14 1800  piperacillin-tazobactam (ZOSYN) IVPB 3.375 g  Status:  Discontinued     3.375 g 100 mL/hr over 30 Minutes Intravenous 4 times per day 10/03/14 1553 10/03/14 1618   10/03/14 1800  meropenem (MERREM) 1 g in sodium chloride 0.9 % 100 mL IVPB  Status:  Discontinued     1 g 200 mL/hr over 30 Minutes Intravenous 3 times per day  10/03/14 1618 10/07/14 1110   10/03/14 1615  vancomycin (VANCOCIN) 1,250 mg in sodium chloride 0.9 % 250 mL IVPB     1,250 mg 166.7 mL/hr over 90 Minutes Intravenous STAT 10/03/14 1608 10/03/14 1753   10/03/14 1600  azithromycin (ZITHROMAX) 250 mg in dextrose 5 % 125 mL IVPB  Status:  Discontinued     250 mg 125 mL/hr over 60 Minutes Intravenous Every 24 hours 10/03/14 1553 10/03/14 1623   10/03/14 0515  levofloxacin (LEVAQUIN) IVPB 750 mg     750 mg 100 mL/hr over 90 Minutes Intravenous  Once 10/03/14 0504 10/03/14 0634      Assessment: 74 y/o M with sepsis due to PNA on azithromycin, meropenem, and Levaquin to deescalate antibiotics to vancomycin and Levaquin.   Vancomycin trough is below goal on dosing of vancomycin 1250 mg iv q 18 hours.  Cultures remain NTD.   Goal of Therapy:  Vancomycin trough level 15-20 mcg/ml  Plan:  1. Vancomycin increased to 1250 mg iv q 12 hours. Will check a trough with the 4th dose. Will order Levaquin 750 mg iv q 24 hours.   2. Patient has not had a bowel movement charted since admission on 8/20 so will start on senna/docusate bid and f/u AM.   Ulice Dash D 10/07/2014,12:30 PM

## 2014-10-07 NOTE — Progress Notes (Signed)
[facility] [place] NAME: [name]    MR#:  [id number]  DATE OF BIRTH:  05-02-1940  SUBJECTIVE:  CHIEF COMPLAINT:   Chief Complaint  Patient presents with  . Altered Mental Status  . Urinary Frequency  . Head Injury   patient as 74 year old Caucasian male who presents to the hospital with altered mental status, increased frequency of urination and fall as well as head injury. He was noted to be severely anemic with hemoglobin level of 5.4. He was started on packed red blood cell transfusion and the having frothing from the mouth, consistent with pulmonary edema, initiated on BiPAP and stabilized with diuretics , now he is off BiPAP on high flow nasal cannulas oxyge, denies significant shortness of breath. Admits of cough and sputum production of yellowish color over the past few weeks. Doing well, wife at bedside  Review of Systems  Constitutional: Negative for fever, weight loss, malaise/fatigue and diaphoresis.  HENT: Negative for ear discharge, ear pain, hearing loss, nosebleeds, sore throat and tinnitus.   Eyes: Negative for blurred vision and pain.  Respiratory: Positive for shortness of breath. Negative for cough, hemoptysis and wheezing.   Cardiovascular: Negative for chest pain, palpitations, orthopnea and leg swelling.  Gastrointestinal: Negative for heartburn, nausea, vomiting, abdominal pain, diarrhea, constipation and blood in stool.  Genitourinary: Negative for dysuria, urgency and frequency.  Musculoskeletal: Negative for myalgias and back pain.  Skin: Negative for itching and rash.  Neurological: Negative for dizziness, tingling, tremors, focal weakness, seizures, weakness and headaches.  Psychiatric/Behavioral: Negative for depression. The patient is not nervous/anxious.     VITAL SIGNS: Blood pressure 130/39, pulse 86, temperature 100 F (37.8 C), temperature source Oral, resp. rate 22, height 5\' 8"  (1.727 m),  weight 69.2 kg (152 lb 8.9 oz), SpO2 95 %.  PHYSICAL EXAMINATION:   GENERAL:  74 y.o.-year-old patient lying in the bed in mild respiratory distress, still on BiPAP early in the morning. He is pale, mildly but not as tachycardic EYES: Pupils equal, round, reactive to light and accommodation. No scleral icterus. Extraocular muscles intact.  HEENT: Head atraumatic, normocephalic. Oropharynx and nasopharynx clear.  NECK:  Supple, no jugular venous distention. No thyroid enlargement, no tenderness.  LUNGS: Better Air entrance bilaterally, but intermittent scattered wheezing, also rales,rhonchi and crepitation noted anteriorly, using accessory muscles to breathe. on high flow oxygen.  CARDIOVASCULAR: S1, S2 normal. No murmurs, rubs, or gallops. Tachycardic. 2 to 3/6 systolic murmur was heard radiating to the left axilla, rhythm was regular ABDOMEN: Soft, nontender, nondistended. Bowel sounds present. No organomegaly or mass.  EXTREMITIES: No pedal edema, cyanosis, or clubbing.  NEUROLOGIC: Cranial nerves II through XII are intact. Muscle strength 5/5 in all extremities. Sensation intact. Gait not checked.  PSYCHIATRIC: The patient is Sleepy Opening eyes intermittently and shakes his head to answer questions. Nonverbal for me. SKIN: No obvious rash, lesion, or ulcer.   ORDERS/RESULTS REVIEWED:   CBC  Recent Labs Lab 10/03/14 0330 10/03/14 1506 10/03/14 2028 10/04/14 0352 10/05/14 0641  WBC 21.6* 31.9* 34.5* 29.7* 25.3*  HGB 5.4* 8.7* 8.3* 8.2* 7.8*  HCT 19.6* 30.7* 29.2* 28.1* 26.3*  PLT 512* 467* 459* 457* 462*  MCV 58.6* 64.5* 63.6* 62.4* 62.4*  MCH 16.1* 18.3* 18.0* 18.3* 18.5*  MCHC 27.5* 28.4* 28.3* 29.3* 29.7*  RDW 23.2* 27.4* 26.9* 27.0* 28.0*  LYMPHSABS  --   --   --   --  1.2  MONOABS  --   --   --   --  1.7*  EOSABS  --   --   --   --  0.1  BASOSABS  --   --   --   --  0.0    ------------------------------------------------------------------------------------------------------------------  Chemistries   Recent Labs Lab 10/03/14 0330 10/03/14 1506 10/04/14 0352 10/07/14 0601  NA 135 137 136  --   K 4.6 3.9 4.0  --   CL 102 101 101  --   CO2 18* 20* 24  --   GLUCOSE 179* 157* 144*  --   BUN 18 17 14   --   CREATININE 0.98 1.18 1.04 0.74  CALCIUM 8.7* 8.0* 8.0*  --   AST 66*  --   --   --   ALT 16*  --   --   --   ALKPHOS 44  --   --   --   BILITOT 0.2*  --   --   --    ------------------------------------------------------------------------------------------------------------------ estimated creatinine clearance is 79.6 mL/min (by C-G formula based on Cr of 0.74). ------------------------------------------------------------------------------------------------------------------  Recent Labs  10/05/14 0641  TSH 4.194    Cardiac Enzymes  Recent Labs Lab 10/04/14 0352 10/04/14 1053 10/04/14 1617  TROPONINI 34.16* 20.96* 18.75*   ASSESSMENT AND PLAN:  Principal Problem:   Sepsis due to pneumonia Active Problems:   Severe anemia   GI bleed   Elevated troponin   DM (diabetes mellitus)   HTN (hypertension)   Altered mental status   Anemia requiring transfusions   Community acquired pneumonia 1. Acute respiratory failure with hypoxia due to acute pulmonary edema, pneumonia and acidosis, metabolic, now off BiPAP, on high flow nasal cannulas . Pulmonary consultation is appreciated. Continue Lasix IV following urine output closely, ins and outs, Now negative of approximately 5.9 L.   2. Pulmonary edema due to blood transfusion as well as fluid resuscitation, acute CHF, continue nitroglycerin topically to facilitate the diuresis, continue Lasix IV following urine output closely. Echocardiogram shows ejection fraction of 40-45%, wall motion abnormalities with akinesis of anterior, anteroseptal and apical wall, cardiologist is recommending  diuresis and follow-up as outpatient, no cardiac catheterization at present unless the patient has chest pains.   3. Sepsis due to pneumonia , the patient is to continue vancomycin, meropenem and Zithromax, following sputum cultures if available, blood cultures are negative so far  4. Severe anemia: likely iron deficiency, apparently recurrent, as discussed with patient's wife. Patient was transfused with packed red blood cells, his hemoglobin level has improved and remains stable. Appreciate gastroenterologist input, although no interventional studies are recommended  5. Non-Q-wave MI, type II due to demand ischemia, continue aspirin, Lipitor, metoprolol per cardiologist, continue nitroglycerin topically, unless severe hypotension, echocardiogram is observed, cardiac consultation is appreciated.   6. Leukocytosis, due to sepsis, pneumonia, some improvement with therapy. Continue antibiotic therapy. Follow white blood cell count in the morning  7. Questionable pneumothorax on chest x-ray. pulmonologist doesn't feels - Could be bleb?  Management plans discussed with the patient, family and they are in agreement.   DRUG ALLERGIES:  Allergies  Allergen Reactions  . Sulfa Antibiotics Itching and Hives  . Amoxicillin Hives  . Ampicillin Hives  . Keflet [Cephalexin] Itching  . Lisinopril Other (See Comments)  . Losartan Other (See Comments)  . Sulfamethoxazole-Trimethoprim Other (See Comments)  . Cephalosporins Hives    CODE STATUS:     Code Status Orders        Start     Ordered   10/03/14 0839  Full code  Continuous     10/03/14 0838      TOTAL CRITICAL CARE TIME TAKING CARE OF THIS PATIENT: 35 minutes.   Can transfer him out to the floor today   long discussion with patient's wife, all questions were answered. She voiced understanding. Time spent approximately 5-10 minutes for discussion with patient's wife  Max Sane M.D on 10/07/2014 at 6:11 PM  Between 7am to 6pm -  Pager - [phone]  After 6pm go to [email or web address] - password [email or web address] [facility]  Office  [phone]  CC: Primary care physician; Isaias Cowman, MD

## 2014-10-08 ENCOUNTER — Inpatient Hospital Stay: Payer: Medicare Other

## 2014-10-08 LAB — GLUCOSE, CAPILLARY
GLUCOSE-CAPILLARY: 143 mg/dL — AB (ref 65–99)
GLUCOSE-CAPILLARY: 218 mg/dL — AB (ref 65–99)
Glucose-Capillary: 120 mg/dL — ABNORMAL HIGH (ref 65–99)
Glucose-Capillary: 130 mg/dL — ABNORMAL HIGH (ref 65–99)
Glucose-Capillary: 174 mg/dL — ABNORMAL HIGH (ref 65–99)
Glucose-Capillary: 198 mg/dL — ABNORMAL HIGH (ref 65–99)

## 2014-10-08 LAB — BASIC METABOLIC PANEL
ANION GAP: 10 (ref 5–15)
BUN: 17 mg/dL (ref 6–20)
CHLORIDE: 95 mmol/L — AB (ref 101–111)
CO2: 30 mmol/L (ref 22–32)
Calcium: 8.2 mg/dL — ABNORMAL LOW (ref 8.9–10.3)
Creatinine, Ser: 0.85 mg/dL (ref 0.61–1.24)
GFR calc Af Amer: >60 mL/min (ref >60)
GFR calc non Af Amer: >60 mL/min (ref >60)
GLUCOSE: 162 mg/dL — AB (ref 65–99)
POTASSIUM: 2.7 mmol/L — AB (ref 3.5–5.1)
Sodium: 135 mmol/L (ref 135–145)

## 2014-10-08 LAB — CULTURE, BLOOD (ROUTINE X 2)
Culture: NO GROWTH
Culture: NO GROWTH

## 2014-10-08 LAB — CBC
HEMATOCRIT: 26.4 % — AB (ref 40.0–52.0)
HEMOGLOBIN: 7.9 g/dL — AB (ref 13.0–18.0)
MCH: 18.5 pg — AB (ref 26.0–34.0)
MCHC: 29.9 g/dL — AB (ref 32.0–36.0)
MCV: 61.9 fL — AB (ref 80.0–100.0)
Platelets: 490 10*3/uL — ABNORMAL HIGH (ref 150–440)
RBC: 4.26 MIL/uL — ABNORMAL LOW (ref 4.40–5.90)
RDW: 28.4 % — ABNORMAL HIGH (ref 11.5–14.5)
WBC: 15.1 10*3/uL — ABNORMAL HIGH (ref 3.8–10.6)

## 2014-10-08 LAB — VANCOMYCIN, TROUGH: Vancomycin Tr: 18 ug/mL (ref 10–20)

## 2014-10-08 LAB — PROCALCITONIN: Procalcitonin: 0.48 ng/mL

## 2014-10-08 MED ORDER — POTASSIUM CHLORIDE CRYS ER 20 MEQ PO TBCR
40.0000 meq | EXTENDED_RELEASE_TABLET | Freq: Two times a day (BID) | ORAL | Status: DC
  Administered 2014-10-08 (×2): 40 meq via ORAL
  Filled 2014-10-08 (×3): qty 2

## 2014-10-08 MED ORDER — POTASSIUM CHLORIDE 10 MEQ/100ML IV SOLN
10.0000 meq | INTRAVENOUS | Status: AC
  Administered 2014-10-08 (×2): 10 meq via INTRAVENOUS
  Filled 2014-10-08 (×4): qty 100

## 2014-10-08 MED ORDER — ISOSORBIDE MONONITRATE ER 30 MG PO TB24
60.0000 mg | ORAL_TABLET | Freq: Every day | ORAL | Status: DC
  Filled 2014-10-08 (×2): qty 2

## 2014-10-08 MED ORDER — SENNOSIDES-DOCUSATE SODIUM 8.6-50 MG PO TABS
1.0000 | ORAL_TABLET | Freq: Two times a day (BID) | ORAL | Status: DC
  Administered 2014-10-08: 1 via ORAL
  Filled 2014-10-08 (×2): qty 1

## 2014-10-08 MED ORDER — LEVOTHYROXINE SODIUM 100 MCG PO TABS
100.0000 ug | ORAL_TABLET | Freq: Every day | ORAL | Status: DC
  Filled 2014-10-08: qty 1

## 2014-10-08 MED ORDER — ENSURE ENLIVE PO LIQD
237.0000 mL | Freq: Two times a day (BID) | ORAL | Status: DC
  Administered 2014-10-09: 237 mL via ORAL

## 2014-10-08 NOTE — Progress Notes (Signed)
[facility] Consultation   ASSESSMENT / PLAN:  This is a 74 year old man who was admitted to the hospital with near syncope, likely related to severe anemia of uncertain etiology. He was transfused and then developed acute pulmonary edema and requiring BiPAP. His course has been complicated by an nSTEMI due to demand ischemia.   PULMONARY 1.acute respiratory failure due to pulmonary edema with the possibility of pneumonia. Slight lucency in the right apex on CXR, however not seen on CT chest.  Plan: Discontinue Levaquin after short course of 5-7 days. Wean down oxygen as tolerated. Chest x-ray shows evidence of prior pulmonary disease - ct chest with diffuse b/l infiltratess with small effusions - cont with current abx and supportive care, mostly a reactive process.   Outpatient follow-up with pulmonary.  -Incentive spirometry, advance activity as tolerated.  We will sign off for now. Please call with any questions. -------------   Name: [name] MRN: [id number] DOB: 1941-02-04     CHIEF COMPLAINT: Dyspnea   HISTORY OF PRESENT ILLNESS:   The patient is laying in bed and currently feeling well. He is conversant with no particular complaints, his wife has several complaints about his general care here. The patient is awake and alert and in good spirits. He does not appear to be in any respiratory distress. He does not appear to have any visible dyspnea.   REVIEW OF SYSTEMS:   ROS  HEENT - no complains CVS - no chest, no chest pressure RESP - cough (mild productive), sob (but improving) ABD - no pain MSK - no acute lesion, no joint paint   VITAL SIGNS: Temp:  [98 F (36.7 C)-100 F (37.8 C)] 98 F (36.7 C) (08/25 0526) Pulse Rate:  [75-86] 75 (08/25 0527) Resp:  [19-22] 20 (08/25 0526) BP: (112-138)/(39-63) 114/50 mmHg (08/25 0527) SpO2:  [92 %-99 %] 94 % (08/25 0526) Weight:  [67.087 kg (147 lb 14.4 oz)] 67.087 kg (147 lb 14.4 oz) (08/25  0527)   Intake/Output Summary (Last 24 hours) at 10/08/14 1307 Last data filed at 10/08/14 1031  Gross per 24 hour  Intake    610 ml  Output   5550 ml  Net  -4940 ml    Physical Examination:   VS: BP 114/50 mmHg  Pulse 75  Temp(Src) 98 F (36.7 C) (Oral)  Resp 20  Ht 5\' 8"  (1.727 m)  Wt 67.087 kg (147 lb 14.4 oz)  BMI 22.49 kg/m2  SpO2 94%  General Appearance: No distress  Neuro:without focal findings, mental status, speech normal,  symmetric, sensation grossly normal  HEENT: PERRLA, EOM intact, no ptosis, no other lesions noticed;  Pulmonary: Coarse breath sounds., good diaphragmatic excursion,   CardiovascularNormal S1,S2.  No m/r/g.    Abdomen: Benign, Soft, non-tender, No masses,  Renal:  No costovertebral tenderness  GU:  Not performed at this time. Endoc: No evident thyromegaly, no signs of acromegaly. Skin:   warm, no rashes, no ecchymosis  Extremities: normal, no cyanosis, clubbing, no edema.   LABS: Reviewed  Imaging Dg Chest 2 View  10/08/2014   CLINICAL DATA:  Shortness of breath, followup infiltrates  EXAM: CHEST - 2 VIEW  COMPARISON:  10/04/2014  FINDINGS: Cardiac shadow is at the upper limits of normal in size. Mild persistent infiltrative changes noted in the right mid and lower lung although the overall appearance has improved significantly in the interval from the prior study. Small bilateral pleural effusions are seen. No acute bony abnormality is  noted  IMPRESSION: Significant improvement in bilateral infiltrates when compare with the prior exam.   Electronically Signed   By: Inez Catalina M.D.   On: 10/08/2014 10:50    Deep Ashby Dawes, M.D.  [facility]

## 2014-10-08 NOTE — Progress Notes (Signed)
Lab called to notify critical Potassium 2.7. On call MD paged to be notified. MD to place new orders.

## 2014-10-08 NOTE — Clinical Social Work Note (Signed)
Lengthy discussion held with patient's wife this afternoon. Patient and wife are happy that they received the bed of their choice at [facility]. CSW will facilitate discharge to [facility] when time. Patient's wife states if patient can be weaned off oxygen, she may transport him. Shela Leff MSW,LCSW [phone]

## 2014-10-08 NOTE — Plan of Care (Signed)
Took over pt care at 15:00.  Pt wked w/PT today - noted that he's improving in strength and balance.  Weaning off O2.  Will be d/ced to [facility] for rehab.

## 2014-10-08 NOTE — Progress Notes (Signed)
Physical Therapy Treatment Patient Details Name: [name] MRN: [id number] DOB: 08/23/1940 Today's Date: 10/08/2014    History of Present Illness [name]  is a 74 y.o. male with a known history of diabetes mellitus type 2, hypertension, hyperlipidemia, hypothyroidism, history of CVA, sleep apnea brought in by his wife with the complaints of noticed going to the bathroom for urination multiple times and she heard a loud noise and patient was noted to be on the floor with head hitting the wall. His wife states he was alert awake but was confused and little bit and did not lose any consciousness. He was also noted to have mild shortness of breath at that time. No history of any fever, chills, chest pain, nausea, vomiting, diarrhea, abdominal pain, dysuria. No history of hematemesis or melena. Does complain of some cough ongoing for the past few days. Evaluation the ED revealed stable vital signs except for mild hypoxia with room air O2 saturations of 89% and workup revealed WBC count of 21.6, H&H 5.4/19.6, troponin 1.32. Chest x-ray mild pulmonary edema, focal opacity left lung, pneumonia versus asymmetric alveolar edema. CT head negative for any acute intracranial pathology/injury. EKG normal sinus rhythm with ventricular rate of 81 bpm, right bundle branch block. Patient was alert awake and oriented and consented for PRBC transfusions. After obtaining blood cultures patient was started on IV antibiotics-levofloxacin. Pt was admitted for anemia, acute respiratory failure due to pulmonary edema, possible PNA, and NSTEMI due to demand ischemia. Patient reports no other falls in the last 12 months except for recent. Prior to admission pt was fully ambulatory without assistive device and independent with all ADLs/IADLs.     PT Comments    Pt demonstrates improving strength and balance. He is still somewhat unsteady initially upon standing but improves throughout ambulation distance. Decreased LE power  and strength noted with sit to stand and LE exercises. SaO2 remains >95% on 2L/min O2 throughout entire ambulation distance without reported DOE. CSW notified that wife is requesting to speak with her regarding discharge timing. Pt will benefit from skilled PT services to address deficits in strength, balance, and mobility in order to return to full function at home.    Follow Up Recommendations  SNF     Equipment Recommendations  Rolling walker with 5" wheels (Further determination at SNF)    Recommendations for Other Services       Precautions / Restrictions Precautions Precautions: None Restrictions Weight Bearing Restrictions: No    Mobility  Bed Mobility Overal bed mobility: Needs Assistance Bed Mobility: Supine to Sit     Supine to sit: Min assist     General bed mobility comments: Assist for trunk righting to go from sidelying. Overall speed and sequencing improved today.  Transfers Overall transfer level: Needs assistance Equipment used: Rolling walker (2 wheeled) Transfers: Sit to/from Stand Sit to Stand: Min assist         General transfer comment: Decreased LE power noted during transfer as he takes extended time to perform. Some posterior lean noted which necessitates minA+1 but overall strength and stability are significantly improved from yesterday. Denies DOE  Ambulation/Gait Ambulation/Gait assistance: Min guard;+2 safety/equipment Ambulation Distance (Feet): 175 Feet Assistive device: Rolling walker (2 wheeled) Gait Pattern/deviations: Decreased step length - right;Decreased step length - left   Gait velocity interpretation: <1.8 ft/sec, indicative of risk for recurrent falls General Gait Details: Pt initially midly unstable with standing which improves as ambulation distance increases. Fatigue and vitals monitored continuously throughout ambulation.  Pt ambulated on 2 L/min O2 and SaO2 remains >95% throughout entire distance. Pt denies DOE. Step length  and speed improved throughout distance.    Stairs            Wheelchair Mobility    Modified Rankin (Stroke Patients Only)       Balance Overall balance assessment: Needs assistance   Sitting balance-Leahy Scale: Good       Standing balance-Leahy Scale: Fair                      Cognition Arousal/Alertness: Awake/alert Behavior During Therapy: WFL for tasks assessed/performed Overall Cognitive Status: Within Functional Limits for tasks assessed                      Exercises General Exercises - Lower Extremity Ankle Circles/Pumps: Strengthening;Both;10 reps;Supine Quad Sets: Strengthening;Both;10 reps;Supine Gluteal Sets: Strengthening;Both;10 reps;Supine Short Arc Quad: Strengthening;Both;10 reps;Supine Long Arc Quad: Strengthening;Both;10 reps;Seated Heel Slides: Strengthening;Both;10 reps;Supine Hip ABduction/ADduction: Strengthening;Both;10 reps;Supine Straight Leg Raises: Strengthening;Both;10 reps;Supine Hip Flexion/Marching: Strengthening;Both;10 reps;Seated    General Comments        Pertinent Vitals/Pain Pain Assessment: No/denies pain    Home Living                      Prior Function            PT Goals (current goals can now be found in the care plan section) Acute Rehab PT Goals Patient Stated Goal: "I want to get out of bed" PT Goal Formulation: With patient/family Time For Goal Achievement: 10/21/14 Potential to Achieve Goals: Good Progress towards PT goals: Progressing toward goals    Frequency  Min 2X/week    PT Plan Current plan remains appropriate    Co-evaluation             End of Session Equipment Utilized During Treatment: Gait belt Activity Tolerance: Patient tolerated treatment well Patient left: in chair;with chair alarm set;with family/visitor present;with call bell/phone within reach (on bed pan)     Time: 2993-7169 PT Time Calculation (min) (ACUTE ONLY): 49 min  Charges:   $Gait Training: 8-22 mins $Therapeutic Exercise: 23-37 mins                    G Codes:      Lyndel Safe [name] PT, DPT   [name] 10/08/2014, 3:29 PM

## 2014-10-08 NOTE — Progress Notes (Addendum)
[facility] NAME: [name]    MR#:  [id number]  DATE OF BIRTH:  11-09-1940  SUBJECTIVE:  CHIEF COMPLAINT:   Chief Complaint  Patient presents with  . Altered Mental Status  . Urinary Frequency  . Head Injury  patient as 74 year old Caucasian male who presents to the hospital with altered mental status, increased frequency of urination and fall as well as head injury. He was noted to be severely anemic with hemoglobin level of 5.4. He was started on packed red blood cell transfusion and the having frothing from the mouth, consistent with pulmonary edema, initiated on BiPAP and stabilized with diuretics , now he is off BiPAP on nasal cannulas oxygen, denies significant shortness of breath. Admits of cough and sputum production of yellowish color over the past few weeks. Doing well, discussed with wife on phone (didn't seem to happy - wanted to stop STATIN and any other new MEDS)  Review of Systems  Constitutional: Negative for fever, weight loss, malaise/fatigue and diaphoresis.  HENT: Negative for ear discharge, ear pain, hearing loss, nosebleeds, sore throat and tinnitus.   Eyes: Negative for blurred vision and pain.  Respiratory: Positive for shortness of breath. Negative for cough, hemoptysis and wheezing.   Cardiovascular: Negative for chest pain, palpitations, orthopnea and leg swelling.  Gastrointestinal: Negative for heartburn, nausea, vomiting, abdominal pain, diarrhea, constipation and blood in stool.  Genitourinary: Negative for dysuria, urgency and frequency.  Musculoskeletal: Negative for myalgias and back pain.  Skin: Negative for itching and rash.  Neurological: Negative for dizziness, tingling, tremors, focal weakness, seizures, weakness and headaches.  Psychiatric/Behavioral: Negative for depression. The patient is not nervous/anxious.     VITAL SIGNS: Blood pressure 114/50, pulse 75, temperature 98 F (36.7  C), temperature source Oral, resp. rate 20, height 5\' 8"  (1.727 m), weight 67.087 kg (147 lb 14.4 oz), SpO2 94 %.  PHYSICAL EXAMINATION:   GENERAL:  74 y.o.-year-old patient lying in the bed in mild respiratory distress, still on BiPAP early in the morning. He is pale, mildly but not as tachycardic EYES: Pupils equal, round, reactive to light and accommodation. No scleral icterus. Extraocular muscles intact.  HEENT: Head atraumatic, normocephalic. Oropharynx and nasopharynx clear.  NECK:  Supple, no jugular venous distention. No thyroid enlargement, no tenderness.  LUNGS: Better Air entrance bilaterally, but intermittent scattered wheezing, also rales,rhonchi and crepitation noted anteriorly, using accessory muscles to breathe. on high flow oxygen.  CARDIOVASCULAR: S1, S2 normal. No murmurs, rubs, or gallops. Tachycardic. 2 to 3/6 systolic murmur was heard radiating to the left axilla, rhythm was regular ABDOMEN: Soft, nontender, nondistended. Bowel sounds present. No organomegaly or mass.  EXTREMITIES: No pedal edema, cyanosis, or clubbing.  NEUROLOGIC: Cranial nerves II through XII are intact. Muscle strength 5/5 in all extremities. Sensation intact. Gait not checked.  PSYCHIATRIC: The patient is Sleepy Opening eyes intermittently and shakes his head to answer questions. Nonverbal for me. SKIN: No obvious rash, lesion, or ulcer.   ORDERS/RESULTS REVIEWED:   CBC  Recent Labs Lab 10/03/14 1506 10/03/14 2028 10/04/14 0352 10/05/14 0641 10/08/14 0539  WBC 31.9* 34.5* 29.7* 25.3* 15.1*  HGB 8.7* 8.3* 8.2* 7.8* 7.9*  HCT 30.7* 29.2* 28.1* 26.3* 26.4*  PLT 467* 459* 457* 462* 490*  MCV 64.5* 63.6* 62.4* 62.4* 61.9*  MCH 18.3* 18.0* 18.3* 18.5* 18.5*  MCHC 28.4* 28.3* 29.3* 29.7* 29.9*  RDW 27.4* 26.9* 27.0* 28.0* 28.4*  LYMPHSABS  --   --   --  1.2  --   MONOABS  --   --   --  1.7*  --   EOSABS  --   --   --  0.1  --   BASOSABS  --   --   --  0.0  --     ------------------------------------------------------------------------------------------------------------------  Chemistries   Recent Labs Lab 10/03/14 0330 10/03/14 1506 10/04/14 0352 10/07/14 0601 10/08/14 0539  NA 135 137 136  --  135  K 4.6 3.9 4.0  --  2.7*  CL 102 101 101  --  95*  CO2 18* 20* 24  --  30  GLUCOSE 179* 157* 144*  --  162*  BUN 18 17 14   --  17  CREATININE 0.98 1.18 1.04 0.74 0.85  CALCIUM 8.7* 8.0* 8.0*  --  8.2*  AST 66*  --   --   --   --   ALT 16*  --   --   --   --   ALKPHOS 44  --   --   --   --   BILITOT 0.2*  --   --   --   --    ------------------------------------------------------------------------------------------------------------------ estimated creatinine clearance is 73.5 mL/min (by C-G formula based on Cr of 0.85). ------------------------------------------------------------------------------------------------------------------ No results for input(s): TSH, T4TOTAL, T3FREE, THYROIDAB in the last 72 hours.  Invalid input(s): FREET3  Cardiac Enzymes  Recent Labs Lab 10/04/14 0352 10/04/14 1053 10/04/14 1617  TROPONINI 34.16* 20.96* 18.75*   ASSESSMENT AND PLAN:  Principal Problem:   Sepsis due to pneumonia Active Problems:   Severe anemia   GI bleed   Elevated troponin   DM (diabetes mellitus)   HTN (hypertension)   Altered mental status   Anemia requiring transfusions   Community acquired pneumonia 1. Acute respiratory failure with hypoxia due to acute pulmonary edema, pneumonia and acidosis, metabolic, now off BiPAP, on high flow nasal cannulas . Pulmonary consultation is appreciated. Continue Lasix IV following urine output closely, ins and outs, Now negative of approximately 9.2 L.   2. Pulmonary edema due to blood transfusion as well as fluid resuscitation, acute CHF, continue nitroglycerin topically to facilitate the diuresis, continue Lasix IV following urine output closely. Echocardiogram shows ejection  fraction of 40-45%, wall motion abnormalities with akinesis of anterior, anteroseptal and apical wall, cardiologist is recommending diuresis and follow-up as outpatient, no cardiac catheterization at present unless the patient has chest pains.   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 Sepsis due to pneumonia , the patient is to continue vancomycin, meropenem and Zithromax, following sputum cultures if available, blood cultures are negative so far  4. Severe anemia: likely iron deficiency, apparently recurrent, as discussed with patient's wife. Patient was transfused with packed red blood cells, his hemoglobin level has improved and remains stable. Appreciate gastroenterologist input, although no interventional studies are recommended  5. Non-Q-wave MI, type II due to demand ischemia, wife wants to stop statin, metoprolol and is very adamant, also requesting to see cardiology.   6. Leukocytosis, due to sepsis, pneumonia, improvement with therapy. Continue antibiotic therapy. Follow white blood cell count in the morning  7. Questionable pneumothorax on chest x-ray. pulmonologist doesn't feels - Could be bleb?  Management plans discussed with the patient, family and they are in agreement.   DRUG ALLERGIES:  Allergies  Allergen Reactions  . Sulfa Antibiotics Itching and Hives  . Amoxicillin Hives  . Ampicillin Hives  . Keflet [Cephalexin] Itching  .  Lisinopril Other (See Comments)  . Losartan Other (See Comments)  . Sulfamethoxazole-Trimethoprim Other (See Comments)  . Cephalosporins Hives    CODE STATUS: Full code  TOTAL TIME TAKING CARE OF THIS PATIENT: 35 minutes.   Possible discharge to rehabilitation if bed available tomorrow, will discontinue Foley and telemetry.   long discussion with patient's wife, all questions were answered. She voiced understanding. Time spent approximately 5-10 minutes for discussion with patient's wife  [facility],  Yashira Offenberger M.D on 10/08/2014 at 11:10 AM  Between 7am to 6pm - Pager - [phone]  After 6pm go to [email or web address] - password [email or web address] [facility]  Office  [phone]  CC: Primary care physician; Isaias Cowman, MD

## 2014-10-08 NOTE — Plan of Care (Signed)
Took over pt care at 15:00.  PT wked w/pt today and noted his strength  And balance are improving.  Up to chair.  Weaning off of O2.  Will be d/ced to [facility] possibly tomorrow.

## 2014-10-08 NOTE — Progress Notes (Signed)
ANTIBIOTIC CONSULT NOTE - FOLLOW UP  Pharmacy Consult for Vancomycin/Levaquin/Constipation Prevention Indication: rule out sepsis/PNA  Allergies  Allergen Reactions  . Sulfa Antibiotics Itching and Hives  . Amoxicillin Hives  . Ampicillin Hives  . Keflet [Cephalexin] Itching  . Lisinopril Other (See Comments)  . Losartan Other (See Comments)  . Sulfamethoxazole-Trimethoprim Other (See Comments)  . Cephalosporins Hives    Patient Measurements: Height: 5\' 8"  (172.7 cm) Weight: 147 lb 14.4 oz (67.087 kg) IBW/kg (Calculated) : 68.4   Vital Signs: Temp: 98.9 F (37.2 C) (08/25 1307) Temp Source: Oral (08/25 1307) BP: 132/49 mmHg (08/25 1307) Pulse Rate: 85 (08/25 1307) Intake/Output from previous day: 08/24 0701 - 08/25 0700 In: 1130 [P.O.:480; IV Piggyback:650] Out: 3600 [Urine:3600] Intake/Output from this shift: Total I/O In: 840 [P.O.:840] Out: 2500 [Urine:2500]  Labs:  Recent Labs  10/07/14 0601 10/08/14 0539  WBC  --  15.1*  HGB  --  7.9*  PLT  --  490*  CREATININE 0.74 0.85   Estimated Creatinine Clearance: 73.5 mL/min (by C-G formula based on Cr of 0.85).  Recent Labs  10/06/14 1024 10/08/14 0957  [place] 7* 18     Microbiology: Recent Results (from the past 720 hour(s))  Culture, blood (routine x 2)     Status: None   Collection Time: 10/03/14  4:24 AM  Result Value Ref Range Status   Specimen Description BLOOD RIGHT ARM  Final   Special Requests BOTTLES DRAWN AEROBIC AND ANAEROBIC 5CC  Final   Culture NO GROWTH 5 DAYS  Final   Report Status 10/08/2014 FINAL  Final  Culture, blood (routine x 2)     Status: None   Collection Time: 10/03/14  4:32 AM  Result Value Ref Range Status   Specimen Description BLOOD RIGHT ANTECUBITAL  Final   Special Requests BOTTLES DRAWN AEROBIC AND ANAEROBIC 5CC  Final   Culture NO GROWTH 5 DAYS  Final   Report Status 10/08/2014 FINAL  Final  Urine culture     Status: None   Collection Time: 10/03/14  4:40  AM  Result Value Ref Range Status   Specimen Description URINE, CLEAN CATCH  Final   Special Requests Normal  Final   Culture MULTIPLE SPECIES PRESENT, SUGGEST RECOLLECTION  Final   Report Status 10/05/2014 FINAL  Final  MRSA PCR Screening     Status: None   Collection Time: 10/03/14  8:42 AM  Result Value Ref Range Status   MRSA by PCR NEGATIVE NEGATIVE Final    Comment:        The GeneXpert MRSA Assay (FDA approved for NASAL specimens only), is one component of a comprehensive MRSA colonization surveillance program. It is not intended to diagnose MRSA infection nor to guide or monitor treatment for MRSA infections.     Anti-infectives    Start     Dose/Rate Route Frequency Ordered Stop   10/07/14 1130  levofloxacin (LEVAQUIN) IVPB 750 mg     750 mg 100 mL/hr over 90 Minutes Intravenous Every 24 hours 10/07/14 1110     10/06/14 2230  vancomycin (VANCOCIN) 1,250 mg in sodium chloride 0.9 % 250 mL IVPB     1,250 mg 166.7 mL/hr over 90 Minutes Intravenous Every 12 hours 10/06/14 1142     10/04/14 0800  azithromycin (ZITHROMAX) 500 mg in dextrose 5 % 250 mL IVPB  Status:  Discontinued     500 mg 250 mL/hr over 60 Minutes Intravenous Every 24 hours 10/03/14 1624 10/07/14 1110  10/04/14 0500  vancomycin (VANCOCIN) 1,250 mg in sodium chloride 0.9 % 250 mL IVPB  Status:  Discontinued     1,250 mg 166.7 mL/hr over 90 Minutes Intravenous Every 18 hours 10/03/14 1631 10/06/14 1142   10/03/14 1800  levofloxacin (LEVAQUIN) IVPB 750 mg  Status:  Discontinued     750 mg 100 mL/hr over 90 Minutes Intravenous Every 24 hours 10/03/14 0942 10/03/14 1553   10/03/14 1800  piperacillin-tazobactam (ZOSYN) IVPB 3.375 g  Status:  Discontinued     3.375 g 100 mL/hr over 30 Minutes Intravenous 4 times per day 10/03/14 1553 10/03/14 1618   10/03/14 1800  meropenem (MERREM) 1 g in sodium chloride 0.9 % 100 mL IVPB  Status:  Discontinued     1 g 200 mL/hr over 30 Minutes Intravenous 3 times per day  10/03/14 1618 10/07/14 1110   10/03/14 1615  vancomycin (VANCOCIN) 1,250 mg in sodium chloride 0.9 % 250 mL IVPB     1,250 mg 166.7 mL/hr over 90 Minutes Intravenous STAT 10/03/14 1608 10/03/14 1753   10/03/14 1600  azithromycin (ZITHROMAX) 250 mg in dextrose 5 % 125 mL IVPB  Status:  Discontinued     250 mg 125 mL/hr over 60 Minutes Intravenous Every 24 hours 10/03/14 1553 10/03/14 1623   10/03/14 0515  levofloxacin (LEVAQUIN) IVPB 750 mg     750 mg 100 mL/hr over 90 Minutes Intravenous  Once 10/03/14 0504 10/03/14 0634      Assessment: 74 y/o M with sepsis due to PNA on levofloxacin and vancomycin.   Goal of Therapy:  Vancomycin trough level 15-20 mcg/ml  Plan:  1. Will continue patient on vancomycin 1250mg  IV Q12hr for goal trough of 15-20. Will continue to monitor troughs as clinically indicated.    2. Will continue patient on levofloxacin 750mg  IV Q24hr.    2. Patient has not had a bowel movement charted since admission on 8/20 so will start on senna/docusate bid and f/u AM.   Will continue to evaluate for deescalating opportunities.   Pharmacy will continue to monitor and adjust per consult.     Jameika Kinn L 10/08/2014,3:53 PM

## 2014-10-08 NOTE — Plan of Care (Signed)
Problem: Discharge Progression Outcomes Goal: Barriers To Progression Addressed/Resolved Individualization: Pt prefers to be called Pilar Plate who lives at home with his wife.  Hx DM, HTN, TIA, Hypothyroid, Arthritis controlled by home medications  High fall risk. Bed alarm on. Pt moved close to nurses station due to frequently jumping out of bed.  Goal: Other Discharge Outcomes/Goals Outcome: Progressing Plan of care progress to goals: 1. No c/o pain. Resting comfortably in bed.  2. Hemodynamically:              -VSS, afebrile, IV abx given as scheduled              -Q4H blood sugar, under control w/ sliding scale              -foley catheter remains for aggressive IV diuresis  3. Tolerating full liquid diet. Order to advance to soft foods as tolerated.  4. +1 assist to [facility]. Room moved close to nurses station due to pt jumping up w/o assistance.

## 2014-10-08 NOTE — Clinical Social Work Note (Signed)
Clinical Social Work Assessment  Patient Details  Name: [name] MRN: [id number] Date of Birth: Jun 13, 1940  Date of referral:  10/08/14               Reason for consult:  Facility Placement, Discharge Planning                Permission sought to share information with:  Family Supports, Chartered certified accountant granted to share information::  Yes, Verbal Permission Granted  Name::     Human resources officer::     Relationship::     Contact Information:     Housing/Transportation Living arrangements for the past 2 months:  Single Family Home Source of Information:  Patient, Spouse Patient Interpreter Needed:  None Criminal Activity/Legal Involvement Pertinent to Current Situation/Hospitalization:  No - Comment as needed Significant Relationships:  Adult Children, Friend, Spouse Lives with:  Self, Spouse Do you feel safe going back to the place where you live?  Yes Need for family participation in patient care:  Yes (Comment)  Care giving concerns:  Pt remains weak and needs to regain strength.   Social Worker assessment / plan:  CSW and RNCM met with pt and wife to address consult.  CSW explained role of CSW.  Pt and wife relocated to [facility] from [place] 5 years ago.  Pt has 2 adult children that still live in [place].  Prior to admission pt was independent and walked without assistance.  CSW explained that PT recommends STR to assist pt in returning to his prior level of functioning.  Pt and wife are in agreement for pt to go to STR.  CSW explained the referral process and provided a list of local providers.  CSW will initiate bed search and extend bed offers once received.  Pt and wife prefer [facility].  Employment status:  Retired Forensic scientist:  Medicare PT Recommendations:  [place] / Referral to community resources:  [place]  Patient/Family's Response to care: Pt and wife were pleasant.  Pt was quite  and occasionally responded to questions.  Pt's wife answered most of the questions.  Patient/Family's Understanding of and Emotional Response to Diagnosis, Current Treatment, and Prognosis:  Pt and wife are in agreement of STR.  Pt's wife expressed that she felt the MD was planning to discharge pt too soon.  CSW and RNCM suggested wife express her concerns to the nurse and doctor.  Pt's wife was cooperative and is a detail oriented advocate for her husband.  Emotional Assessment Appearance:  Appears stated age Attitude/Demeanor/Rapport:  Other (Cooperative) Affect (typically observed):  Accepting, Quiet, Pleasant Orientation:  Oriented to Self, Oriented to Place, Oriented to  Time, Oriented to Situation Alcohol / Substance use:  Not Applicable Psych involvement (Current and /or in the community):  No (Comment)  Discharge Needs  Concerns to be addressed:  Discharge Planning Concerns Readmission within the last 30 days:  No Current discharge risk:  None Barriers to Discharge:  Continued Medical Work up   [place], [place], [place] 10/08/2014, 1:18 PM

## 2014-10-08 NOTE — Plan of Care (Signed)
Problem: Discharge Progression Outcomes Goal: Other Discharge Outcomes/Goals Outcome: Progressing Took over pt care at 15:00.   PT wked w/pt and noted that he's increasing in strength and balance.  Weaning off of O2.  Will be d/ced to [facility] for rehab possibly tomorrow.

## 2014-10-08 NOTE — Progress Notes (Signed)
Nutrition Follow-up   INTERVENTION:   Meals and Snacks: Cater to patient preferences with diet advancement to Soft diet order. Spoke with pt wife will send bottled water on all trays and Lactaid milk instead of regular milk. Pt does not have lactose intolerance but tolerates Lactaid better per wife. Medical Food Supplement Therapy: will recommend Ensure Enlive po BID, each supplement provides 350 kcal and 20 grams of protein   NUTRITION DIAGNOSIS:   Inadequate oral intake related to acute illness as evidenced by  (CL diet), advanced  GOAL:   Patient will meet greater than or equal to 90% of their needs; ongoing  MONITOR:    (Energy Intake, Anthropmetrics, Digestive System, Electrolyte/Renal Profile, Pulmonary)   ASSESSMENT:   Pt with anemia s/p transfusion, now off bipap. Admission complicated by NSTEMI.  Pt out of room this am.  Past Medical History  Diagnosis Date  . Diabetes mellitus without complication   . Hypertension   . Hearing loss     left ear  . TIA (transient ischemic attack)   . Hypothyroidism   . Sleep apnea   . Stroke   . Arthritis     Diet Order:  DIET SOFT Room service appropriate?: Yes; Fluid consistency:: Thin   Current Nutrition: Per wife pt did not eat cream of wheat this am as he does not like but is tolerating FL. Recorded intake <50% of meals.   Gastrointestinal Profile: Last BM: 10/08/2014   Medications: Lasix, Novolog, Mag-Ox, omega-3, KCl, protonix  Electrolyte/Renal Profile and Glucose Profile:   Recent Labs Lab 10/03/14 1506 10/04/14 0352 10/07/14 0601 10/08/14 0539  NA 137 136  --  135  K 3.9 4.0  --  2.7*  CL 101 101  --  95*  CO2 20* 24  --  30  BUN 17 14  --  17  CREATININE 1.18 1.04 0.74 0.85  CALCIUM 8.0* 8.0*  --  8.2*  GLUCOSE 157* 144*  --  162*   Protein Profile:  Recent Labs Lab 10/03/14 0330  ALBUMIN 4.1      Weight Trend since Admission: Filed Weights   10/06/14 0419 10/07/14 0546 10/08/14 0527   Weight: 144 lb 10 oz (65.6 kg) 152 lb 8.9 oz (69.2 kg) 147 lb 14.4 oz (67.087 kg)    BMI:  Body mass index is 22.49 kg/(m^2).  Estimated Nutritional Needs:   Kcal:  1960-2315 kcals (BEE 1370, 1.3 AF, 1.1-1.3 IF)   Protein:  73-86 g (1.1-1.3 g/kg)   Fluid:  1980-2310 mL (30-35 ml/kg)     MODERATE Care Level  Dwyane Luo, RD, LDN Pager [phone]

## 2014-10-08 NOTE — Plan of Care (Signed)
Problem: Discharge Progression Outcomes Goal: Activity appropriate for discharge plan Outcome: Progressing Working with PT today to evaluate if pt needs outpatient rehab

## 2014-10-09 DIAGNOSIS — D638 Anemia in other chronic diseases classified elsewhere: Secondary | ICD-10-CM | POA: Diagnosis not present

## 2014-10-09 DIAGNOSIS — R4182 Altered mental status, unspecified: Secondary | ICD-10-CM | POA: Diagnosis not present

## 2014-10-09 DIAGNOSIS — I340 Nonrheumatic mitral (valve) insufficiency: Secondary | ICD-10-CM | POA: Diagnosis not present

## 2014-10-09 DIAGNOSIS — D509 Iron deficiency anemia, unspecified: Secondary | ICD-10-CM | POA: Diagnosis not present

## 2014-10-09 DIAGNOSIS — G4730 Sleep apnea, unspecified: Secondary | ICD-10-CM | POA: Diagnosis not present

## 2014-10-09 DIAGNOSIS — I5033 Acute on chronic diastolic (congestive) heart failure: Secondary | ICD-10-CM | POA: Diagnosis not present

## 2014-10-09 DIAGNOSIS — A419 Sepsis, unspecified organism: Secondary | ICD-10-CM | POA: Diagnosis not present

## 2014-10-09 DIAGNOSIS — J90 Pleural effusion, not elsewhere classified: Secondary | ICD-10-CM | POA: Diagnosis not present

## 2014-10-09 DIAGNOSIS — J189 Pneumonia, unspecified organism: Secondary | ICD-10-CM | POA: Diagnosis not present

## 2014-10-09 DIAGNOSIS — R1310 Dysphagia, unspecified: Secondary | ICD-10-CM | POA: Diagnosis not present

## 2014-10-09 DIAGNOSIS — I248 Other forms of acute ischemic heart disease: Secondary | ICD-10-CM | POA: Diagnosis not present

## 2014-10-09 DIAGNOSIS — Z79899 Other long term (current) drug therapy: Secondary | ICD-10-CM | POA: Diagnosis not present

## 2014-10-09 DIAGNOSIS — R531 Weakness: Secondary | ICD-10-CM | POA: Diagnosis not present

## 2014-10-09 DIAGNOSIS — K2971 Gastritis, unspecified, with bleeding: Secondary | ICD-10-CM | POA: Diagnosis not present

## 2014-10-09 DIAGNOSIS — R5383 Other fatigue: Secondary | ICD-10-CM | POA: Diagnosis not present

## 2014-10-09 DIAGNOSIS — J181 Lobar pneumonia, unspecified organism: Secondary | ICD-10-CM | POA: Diagnosis not present

## 2014-10-09 DIAGNOSIS — J811 Chronic pulmonary edema: Secondary | ICD-10-CM | POA: Diagnosis not present

## 2014-10-09 DIAGNOSIS — E119 Type 2 diabetes mellitus without complications: Secondary | ICD-10-CM | POA: Diagnosis not present

## 2014-10-09 DIAGNOSIS — R262 Difficulty in walking, not elsewhere classified: Secondary | ICD-10-CM | POA: Diagnosis not present

## 2014-10-09 DIAGNOSIS — I10 Essential (primary) hypertension: Secondary | ICD-10-CM | POA: Diagnosis not present

## 2014-10-09 DIAGNOSIS — E039 Hypothyroidism, unspecified: Secondary | ICD-10-CM | POA: Diagnosis not present

## 2014-10-09 DIAGNOSIS — J810 Acute pulmonary edema: Secondary | ICD-10-CM | POA: Diagnosis not present

## 2014-10-09 DIAGNOSIS — J9601 Acute respiratory failure with hypoxia: Secondary | ICD-10-CM | POA: Diagnosis not present

## 2014-10-09 DIAGNOSIS — M6281 Muscle weakness (generalized): Secondary | ICD-10-CM | POA: Diagnosis not present

## 2014-10-09 DIAGNOSIS — K922 Gastrointestinal hemorrhage, unspecified: Secondary | ICD-10-CM | POA: Diagnosis not present

## 2014-10-09 DIAGNOSIS — I252 Old myocardial infarction: Secondary | ICD-10-CM | POA: Diagnosis not present

## 2014-10-09 DIAGNOSIS — I639 Cerebral infarction, unspecified: Secondary | ICD-10-CM | POA: Diagnosis not present

## 2014-10-09 DIAGNOSIS — D473 Essential (hemorrhagic) thrombocythemia: Secondary | ICD-10-CM | POA: Diagnosis not present

## 2014-10-09 DIAGNOSIS — M129 Arthropathy, unspecified: Secondary | ICD-10-CM | POA: Diagnosis not present

## 2014-10-09 DIAGNOSIS — E782 Mixed hyperlipidemia: Secondary | ICD-10-CM | POA: Diagnosis not present

## 2014-10-09 DIAGNOSIS — I214 Non-ST elevation (NSTEMI) myocardial infarction: Secondary | ICD-10-CM | POA: Diagnosis not present

## 2014-10-09 DIAGNOSIS — R488 Other symbolic dysfunctions: Secondary | ICD-10-CM | POA: Diagnosis not present

## 2014-10-09 DIAGNOSIS — Z8673 Personal history of transient ischemic attack (TIA), and cerebral infarction without residual deficits: Secondary | ICD-10-CM | POA: Diagnosis not present

## 2014-10-09 DIAGNOSIS — Z87891 Personal history of nicotine dependence: Secondary | ICD-10-CM | POA: Diagnosis not present

## 2014-10-09 DIAGNOSIS — G9341 Metabolic encephalopathy: Secondary | ICD-10-CM | POA: Diagnosis not present

## 2014-10-09 DIAGNOSIS — Z8719 Personal history of other diseases of the digestive system: Secondary | ICD-10-CM | POA: Diagnosis not present

## 2014-10-09 DIAGNOSIS — H918X2 Other specified hearing loss, left ear: Secondary | ICD-10-CM | POA: Diagnosis not present

## 2014-10-09 DIAGNOSIS — I4510 Unspecified right bundle-branch block: Secondary | ICD-10-CM | POA: Diagnosis not present

## 2014-10-09 LAB — BASIC METABOLIC PANEL
Anion gap: 9 (ref 5–15)
BUN: 17 mg/dL (ref 6–20)
CALCIUM: 8.5 mg/dL — AB (ref 8.9–10.3)
CO2: 29 mmol/L (ref 22–32)
CREATININE: 0.73 mg/dL (ref 0.61–1.24)
Chloride: 99 mmol/L — ABNORMAL LOW (ref 101–111)
Glucose, Bld: 134 mg/dL — ABNORMAL HIGH (ref 65–99)
Potassium: 3.3 mmol/L — ABNORMAL LOW (ref 3.5–5.1)
SODIUM: 137 mmol/L (ref 135–145)

## 2014-10-09 LAB — CBC
HCT: 27.4 % — ABNORMAL LOW (ref 40.0–52.0)
HEMOGLOBIN: 8.3 g/dL — AB (ref 13.0–18.0)
MCH: 19.1 pg — AB (ref 26.0–34.0)
MCHC: 30.2 g/dL — AB (ref 32.0–36.0)
MCV: 63.3 fL — ABNORMAL LOW (ref 80.0–100.0)
PLATELETS: 544 10*3/uL — AB (ref 150–440)
RBC: 4.32 MIL/uL — ABNORMAL LOW (ref 4.40–5.90)
RDW: 28.8 % — AB (ref 11.5–14.5)
WBC: 14.5 10*3/uL — ABNORMAL HIGH (ref 3.8–10.6)

## 2014-10-09 LAB — GLUCOSE, CAPILLARY
GLUCOSE-CAPILLARY: 134 mg/dL — AB (ref 65–99)
GLUCOSE-CAPILLARY: 130 mg/dL — AB (ref 65–99)
GLUCOSE-CAPILLARY: 311 mg/dL — AB (ref 65–99)
Glucose-Capillary: 145 mg/dL — ABNORMAL HIGH (ref 65–99)

## 2014-10-09 MED ORDER — ISOSORBIDE MONONITRATE ER 60 MG PO TB24: 60.0000 mg | ORAL_TABLET | Freq: Every day | ORAL | Status: DC

## 2014-10-09 MED ORDER — ENSURE ENLIVE PO LIQD
237.0000 mL | Freq: Two times a day (BID) | ORAL | Status: DC
Start: 2014-10-09 — End: 2015-06-23

## 2014-10-09 MED ORDER — LEVOFLOXACIN 500 MG PO TABS: 500.0000 mg | ORAL_TABLET | Freq: Every day | ORAL | Status: DC

## 2014-10-09 MED ORDER — FUROSEMIDE 20 MG PO TABS: 20.0000 mg | ORAL_TABLET | Freq: Two times a day (BID) | ORAL | Status: DC

## 2014-10-09 MED ORDER — POTASSIUM CHLORIDE ER 20 MEQ PO TBCR: 20.0000 meq | EXTENDED_RELEASE_TABLET | Freq: Every day | ORAL | Status: DC

## 2014-10-09 NOTE — Discharge Instructions (Signed)

## 2014-10-09 NOTE — Discharge Summary (Signed)
[facility] NAME: [name]    MR#:  [id number]  DATE OF BIRTH:  1940/02/21  DATE OF ADMISSION:  10/03/2014 ADMITTING PHYSICIAN: Juluis Mire, MD  DATE OF DISCHARGE: 10/09/2014  PRIMARY CARE PHYSICIAN: Isaias Cowman, MD    ADMISSION DIAGNOSIS:  Community acquired pneumonia [J18.9] Elevated troponin [R79.89] Anemia requiring transfusions [D64.9] Sepsis, due to unspecified organism [A41.9] Altered mental status, unspecified altered mental status type [R41.82] Gastrointestinal hemorrhage, unspecified gastritis, unspecified gastrointestinal hemorrhage type [K92.2] DISCHARGE DIAGNOSIS:  Principal Problem:   Sepsis due to pneumonia Active Problems:   Severe anemia   GI bleed   Elevated troponin   DM (diabetes mellitus)   HTN (hypertension)   Altered mental status   Anemia requiring transfusions   Community acquired pneumonia  SECONDARY DIAGNOSIS:   Past Medical History  Diagnosis Date  . Diabetes mellitus without complication   . Hypertension   . Hearing loss     left ear  . TIA (transient ischemic attack)   . Hypothyroidism   . Sleep apnea   . Stroke   . Arthritis    HOSPITAL COURSE:  74 y.o. male with a known history of diabetes mellitus type 2, hypertension, hyperlipidemia, hypothyroidism, history of CVA, sleep apnea , admitted to the hospital for sepsis secondary to pneumonia and/or UTI.  Please see Dr. Reddy's dated history and physical for further details.  Patient was also noted to have severe anemia with hemoglobin of 5.4 on admission. He required 2-3 units of packed red blood cell transfusion for conservative management.  He was evaluated by GI Dr. Gaylyn Cheers recommended no luminal evaluation considering his heart attack on this admission. Given his heart attack, He was recommend go ahead with asprin but hold stronger anticoagulation meds at this time. No plans for elective endoscopy or  colonoscopy until at least 6 weeks out or sooner if has major bleed per GI.  He also ruled in for non-ST elevation MI with a troponin peak at 45.67 on 20th of August.  Audiology consultation was obtained and Dr. Saralyn Pilar who recommended conservative management. His MI was thought to be in the setting of respiratory failure, pneumonia, sepsis and severe anemia, without chest pain.  2-D echocardiogram was requested which showed LVEF of 40%.  Cardiology recommended to defer full dose anticoagulation at this time in the absence of chest pain.  They also recommended to defer initial early invasive cardiac evaluation.  He also required evaluation by pulmonary as post blood transfusion he developed acute pulmonary edema which required BiPAP.  In setting his acute respiratory failure.  He underwent CT scan of the chest which also showed pneumonia for which she was started on broad-spectrum IV antibiotics.  He was also started on aggressive diuresis  He responded well to diuresis and antibiotic regimen was weaned down to high flow nasal cannula and subsequently now he is at room air with > 9 liters of diuresis.  As evaluated by physical therapy and was recommended rehabilitation where he is being discharged in stable condition.  Patient and family are in agreement with the same. DISCHARGE CONDITIONS:  Stable CONSULTS OBTAINED:  Treatment Team:  Manya Silvas, MD Isaias Cowman, MD Lequita Asal, MD DRUG ALLERGIES:   Allergies  Allergen Reactions  . Sulfa Antibiotics Itching and Hives  . Amoxicillin Hives  . Ampicillin Hives  . Keflet [Cephalexin] Itching  . Lisinopril Other (See Comments)  . Losartan Other (See Comments)  .  Sulfamethoxazole-Trimethoprim Other (See Comments)  . Cephalosporins Hives   DISCHARGE MEDICATIONS:   Current Discharge Medication List    START taking these medications   Details  feeding supplement, ENSURE ENLIVE, (ENSURE ENLIVE) LIQD Take 237 mLs by  mouth 2 (two) times daily between meals. Qty: 237 mL, Refills: 12    furosemide (LASIX) 20 MG tablet Take 1 tablet (20 mg total) by mouth 2 (two) times daily. Qty: 30 tablet, Refills: 0    isosorbide mononitrate (IMDUR) 60 MG 24 hr tablet Take 1 tablet (60 mg total) by mouth daily. Qty: 30 tablet, Refills: 0    levofloxacin (LEVAQUIN) 500 MG tablet Take 1 tablet (500 mg total) by mouth daily. Qty: 3 tablet, Refills: 0    potassium chloride 20 MEQ TBCR Take 20 mEq by mouth daily. Qty: 30 tablet, Refills: 0      CONTINUE these medications which have NOT CHANGED   Details  Cholecalciferol (VITAMIN D3) 1000 UNITS CAPS Take 2,000 Units by mouth.    levothyroxine (SYNTHROID, LEVOTHROID) 100 MCG tablet Take 100 mcg by mouth every morning.    magnesium oxide (MAG-OX) 400 MG tablet Take 400 mg by mouth at bedtime.    !! metFORMIN (GLUCOPHAGE) 500 MG tablet Take 4 tablets by mouth daily.    !! metFORMIN (GLUCOPHAGE) 500 MG tablet Take 500 mg by mouth daily with breakfast.    !! metFORMIN (GLUCOPHAGE) 500 MG tablet Take 1,500 mg by mouth daily with supper.    metoprolol tartrate (LOPRESSOR) 25 MG tablet Take 12.5 mg by mouth 2 (two) times daily.     Multiple Vitamins-Minerals (MULTI FOR HIM 50+ PO) Take 1 tablet by mouth daily.    Omega-3 Fatty Acids (FISH OIL) 1000 MG CAPS Take 1 capsule by mouth daily.    Red Yeast Rice 600 MG TABS Take 2 tablets by mouth daily.     !! - Potential duplicate medications found. Please discuss with provider.     DISCHARGE INSTRUCTIONS:   DIET:  Cardiac diet DISCHARGE CONDITION:  Good ACTIVITY:  Activity as tolerated OXYGEN:  Home Oxygen: No.  Oxygen Delivery: room air DISCHARGE LOCATION:  nursing home   If you experience worsening of your admission symptoms, develop shortness of breath, life threatening emergency, suicidal or homicidal thoughts you must seek medical attention immediately by calling 911 or calling your MD immediately  if  symptoms less severe.  You Must read complete instructions/literature along with all the possible adverse reactions/side effects for all the Medicines you take and that have been prescribed to you. Take any new Medicines after you have completely understood and accpet all the possible adverse reactions/side effects.   Please note  You were cared for by a hospitalist during your hospital stay. If you have any questions about your discharge medications or the care you received while you were in the hospital after you are discharged, you can call the unit and asked to speak with the hospitalist on call if the hospitalist that took care of you is not available. Once you are discharged, your primary care physician will handle any further medical issues. Please note that NO REFILLS for any discharge medications will be authorized once you are discharged, as it is imperative that you return to your primary care physician (or establish a relationship with a primary care physician if you do not have one) for your aftercare needs so that they can reassess your need for medications and monitor your lab values.    On the day  of Discharge: VITAL SIGNS:  Blood pressure 126/50, pulse 68, temperature 97.8 F (36.6 C), temperature source Axillary, resp. rate 18, height 5\' 8"  (1.727 m), weight 65.363 kg (144 lb 1.6 oz), SpO2 95 %. PHYSICAL EXAMINATION:  GENERAL:  74 y.o.-year-old patient lying in the bed with no acute distress.  EYES: Pupils equal, round, reactive to light and accommodation. No scleral icterus. Extraocular muscles intact.  HEENT: Head atraumatic, normocephalic. Oropharynx and nasopharynx clear.  NECK:  Supple, no jugular venous distention. No thyroid enlargement, no tenderness.  LUNGS: Normal breath sounds bilaterally, no wheezing, rales,rhonchi or crepitation. No use of accessory muscles of respiration.  CARDIOVASCULAR: S1, S2 normal. No murmurs, rubs, or gallops.  ABDOMEN: Soft, non-tender,  non-distended. Bowel sounds present. No organomegaly or mass.  EXTREMITIES: No pedal edema, cyanosis, or clubbing.  NEUROLOGIC: Cranial nerves II through XII are intact. Muscle strength 5/5 in all extremities. Sensation intact. Gait not checked.  PSYCHIATRIC: The patient is alert and oriented x 3.  SKIN: No obvious rash, lesion, or ulcer.  DATA REVIEW:   CBC  Recent Labs Lab 10/09/14 0726  WBC 14.5*  HGB 8.3*  HCT 27.4*  PLT 544*    Chemistries   Recent Labs Lab 10/03/14 0330  10/09/14 0726  NA 135  < > 137  K 4.6  < > 3.3*  CL 102  < > 99*  CO2 18*  < > 29  GLUCOSE 179*  < > 134*  BUN 18  < > 17  CREATININE 0.98  < > 0.73  CALCIUM 8.7*  < > 8.5*  AST 66*  --   --   ALT 16*  --   --   ALKPHOS 44  --   --   BILITOT 0.2*  --   --   < > = values in this interval not displayed.  Cardiac Enzymes  Recent Labs Lab 10/04/14 1617  TROPONINI 18.75*    Microbiology Results  Results for orders placed or performed during the hospital encounter of 10/03/14  Culture, blood (routine x 2)     Status: None   Collection Time: 10/03/14  4:24 AM  Result Value Ref Range Status   Specimen Description BLOOD RIGHT ARM  Final   Special Requests BOTTLES DRAWN AEROBIC AND ANAEROBIC 5CC  Final   Culture NO GROWTH 5 DAYS  Final   Report Status 10/08/2014 FINAL  Final  Culture, blood (routine x 2)     Status: None   Collection Time: 10/03/14  4:32 AM  Result Value Ref Range Status   Specimen Description BLOOD RIGHT ANTECUBITAL  Final   Special Requests BOTTLES DRAWN AEROBIC AND ANAEROBIC 5CC  Final   Culture NO GROWTH 5 DAYS  Final   Report Status 10/08/2014 FINAL  Final  Urine culture     Status: None   Collection Time: 10/03/14  4:40 AM  Result Value Ref Range Status   Specimen Description URINE, CLEAN CATCH  Final   Special Requests Normal  Final   Culture MULTIPLE SPECIES PRESENT, SUGGEST RECOLLECTION  Final   Report Status 10/05/2014 FINAL  Final  MRSA PCR Screening      Status: None   Collection Time: 10/03/14  8:42 AM  Result Value Ref Range Status   MRSA by PCR NEGATIVE NEGATIVE Final    Comment:        The GeneXpert MRSA Assay (FDA approved for NASAL specimens only), is one component of a comprehensive MRSA colonization surveillance program. It is not intended  to diagnose MRSA infection nor to guide or monitor treatment for MRSA infections.     RADIOLOGY:  Dg Chest 2 View  10/08/2014   CLINICAL DATA:  Shortness of breath, followup infiltrates  EXAM: CHEST - 2 VIEW  COMPARISON:  10/04/2014  FINDINGS: Cardiac shadow is at the upper limits of normal in size. Mild persistent infiltrative changes noted in the right mid and lower lung although the overall appearance has improved significantly in the interval from the prior study. Small bilateral pleural effusions are seen. No acute bony abnormality is noted  IMPRESSION: Significant improvement in bilateral infiltrates when compare with the prior exam.   Electronically Signed   By: Inez Catalina M.D.   On: 10/08/2014 10:50   Management plans discussed with the patient, family and they are in agreement.  CODE STATUS: Full code  TOTAL TIME TAKING CARE OF THIS PATIENT: 55 minutes.    [facility], Roald Lukacs M.D on 10/09/2014 at 10:42 AM  Between 7am to 6pm - Pager - [phone]  After 6pm go to [email or web address] - password [email or web address] [facility]  Office  [phone]  CC: Primary care physician; Isaias Cowman, MD  Manya Silvas, MD Lequita Asal, MD

## 2014-10-09 NOTE — Care Management (Signed)
Patient admitted with sepsis.  Patient lives at home with wife.  Patient's wife states that there is not a particular pharmacy they use "I usually just go where ever is cheaper". Patient does not have a chronic need for home O2 and does not have any home equipment. Patient the patient and his wife drive.  Wife states that she does not have any financial or home needs. Plan is for patient to discharge to SNF today.  RNCM signing off.  CSW to follow.

## 2014-10-09 NOTE — Care Management Important Message (Signed)
Important Message  Patient Details  Name: [name] MRN: [id number] Date of Birth: 24-Oct-1940   Medicare Important Message Given:  Geralyn Flash notification given    Juliann Pulse A Allmond 10/09/2014, 9:30 AM

## 2014-10-09 NOTE — Progress Notes (Signed)
MD making rounds. Discharge orders received. SW facilitating discharge to [facility]. SW paperwork completed for discharge to [facility]. IV removed. Report called to Crystal at [facility]. No unanswered questions. Discharge paperwork given to family to give to admission at [facility]. No unanswered questions. Escorted via wheelchair by [facility] representative. All belongings sent with patient and family.

## 2014-10-09 NOTE — Plan of Care (Signed)
Problem: Discharge Progression Outcomes Goal: Other Discharge Outcomes/Goals Outcome: Progressing Plan of care progress to goals: 1. No c/o pain. Resting comfortably in bed.   2. Hemodynamically:               -VSS, afebrile, IV abx given as scheduled               -Q4H blood sugar, under control w/ sliding scale   3. Tolerating soft food diet. .   4. +1 assist to [facility]. Room close to nurses station due to pt jumping up w/o assistance.

## 2014-10-09 NOTE — Clinical Social Work Placement (Signed)
   CLINICAL SOCIAL WORK PLACEMENT  NOTE  Date:  10/09/2014  Patient Details  Name: [name] MRN: [id number] Date of Birth: [birth year]  Clinical Social Work is seeking post-discharge placement for this patient at the [place] level of care (*CSW will initial, date and re-position this form in  chart as items are completed):  Yes   Patient/family provided with [facility] Work Department's list of facilities offering this level of care within the geographic area requested by the patient (or if unable, by the patient's family).  Yes   Patient/family informed of their freedom to choose among providers that offer the needed level of care, that participate in Medicare, Medicaid or managed care program needed by the patient, have an available bed and are willing to accept the patient.  Yes   Patient/family informed of [facility]'s ownership interest in [facility] and [facility], as well as of the fact that they are under no obligation to receive care at these facilities.  PASRR submitted to EDS on 10/08/14     PASRR number received on 10/08/14     Existing PASRR number confirmed on       FL2 transmitted to all facilities in geographic area requested by pt/family on 10/08/14     FL2 transmitted to all facilities within larger geographic area on       Patient informed that his/her managed care company has contracts with or will negotiate with certain facilities, including the following:        Yes   Patient/family informed of bed offers received.  Patient chooses bed at  [facility])     Physician recommends and patient chooses bed at  [facility])    Patient to be transferred to  ([facility]) on 10/09/14.  Patient to be transferred to facility by  (family car)     Patient family notified on 10/09/14 of transfer.  Name of family member notified:   (patient's wife)     PHYSICIAN Please sign FL2     Additional Comment:     _______________________________________________ Shela Leff, LCSW 10/09/2014, 11:42 AM

## 2014-10-09 NOTE — Clinical Social Work Note (Signed)
Patient to discharge today to [facility]. Discharge summary sent to [facility] and patient has been weaned off oxygen and patient's wife will transport him. [facility] is aware of admission. Shela Leff MSW,LCSW [phone]

## 2014-10-13 DIAGNOSIS — E119 Type 2 diabetes mellitus without complications: Secondary | ICD-10-CM | POA: Diagnosis not present

## 2014-10-13 DIAGNOSIS — E782 Mixed hyperlipidemia: Secondary | ICD-10-CM | POA: Diagnosis not present

## 2014-10-13 DIAGNOSIS — D638 Anemia in other chronic diseases classified elsewhere: Secondary | ICD-10-CM | POA: Diagnosis not present

## 2014-10-13 DIAGNOSIS — K2971 Gastritis, unspecified, with bleeding: Secondary | ICD-10-CM | POA: Diagnosis not present

## 2014-10-13 DIAGNOSIS — I10 Essential (primary) hypertension: Secondary | ICD-10-CM | POA: Diagnosis not present

## 2014-10-13 DIAGNOSIS — E039 Hypothyroidism, unspecified: Secondary | ICD-10-CM | POA: Diagnosis not present

## 2014-10-13 DIAGNOSIS — A419 Sepsis, unspecified organism: Secondary | ICD-10-CM | POA: Diagnosis not present

## 2014-10-13 DIAGNOSIS — J181 Lobar pneumonia, unspecified organism: Secondary | ICD-10-CM | POA: Diagnosis not present

## 2014-10-20 DIAGNOSIS — K2971 Gastritis, unspecified, with bleeding: Secondary | ICD-10-CM | POA: Diagnosis not present

## 2014-10-20 DIAGNOSIS — D638 Anemia in other chronic diseases classified elsewhere: Secondary | ICD-10-CM | POA: Diagnosis not present

## 2014-10-20 DIAGNOSIS — I248 Other forms of acute ischemic heart disease: Secondary | ICD-10-CM | POA: Diagnosis not present

## 2014-10-20 DIAGNOSIS — I10 Essential (primary) hypertension: Secondary | ICD-10-CM | POA: Diagnosis not present

## 2014-10-20 DIAGNOSIS — J181 Lobar pneumonia, unspecified organism: Secondary | ICD-10-CM | POA: Diagnosis not present

## 2014-10-20 DIAGNOSIS — E119 Type 2 diabetes mellitus without complications: Secondary | ICD-10-CM | POA: Diagnosis not present

## 2014-10-26 ENCOUNTER — Ambulatory Visit: Payer: Medicare Other | Admitting: Family Medicine

## 2014-10-26 ENCOUNTER — Inpatient Hospital Stay: Payer: Medicare Other | Attending: Family Medicine | Admitting: Oncology

## 2014-10-26 ENCOUNTER — Inpatient Hospital Stay: Payer: Medicare Other

## 2014-10-26 VITALS — BP 129/68 | HR 59 | Temp 96.1°F | Resp 16 | Ht 67.13 in | Wt 150.8 lb

## 2014-10-26 DIAGNOSIS — I252 Old myocardial infarction: Secondary | ICD-10-CM | POA: Diagnosis not present

## 2014-10-26 DIAGNOSIS — I10 Essential (primary) hypertension: Secondary | ICD-10-CM

## 2014-10-26 DIAGNOSIS — D509 Iron deficiency anemia, unspecified: Secondary | ICD-10-CM

## 2014-10-26 DIAGNOSIS — Z87891 Personal history of nicotine dependence: Secondary | ICD-10-CM

## 2014-10-26 DIAGNOSIS — H918X2 Other specified hearing loss, left ear: Secondary | ICD-10-CM

## 2014-10-26 DIAGNOSIS — R5383 Other fatigue: Secondary | ICD-10-CM

## 2014-10-26 DIAGNOSIS — R531 Weakness: Secondary | ICD-10-CM | POA: Diagnosis not present

## 2014-10-26 DIAGNOSIS — G4730 Sleep apnea, unspecified: Secondary | ICD-10-CM

## 2014-10-26 DIAGNOSIS — E119 Type 2 diabetes mellitus without complications: Secondary | ICD-10-CM

## 2014-10-26 DIAGNOSIS — Z79899 Other long term (current) drug therapy: Secondary | ICD-10-CM

## 2014-10-26 DIAGNOSIS — J90 Pleural effusion, not elsewhere classified: Secondary | ICD-10-CM

## 2014-10-26 DIAGNOSIS — Z8673 Personal history of transient ischemic attack (TIA), and cerebral infarction without residual deficits: Secondary | ICD-10-CM

## 2014-10-26 DIAGNOSIS — Z8719 Personal history of other diseases of the digestive system: Secondary | ICD-10-CM

## 2014-10-26 DIAGNOSIS — M129 Arthropathy, unspecified: Secondary | ICD-10-CM

## 2014-10-26 DIAGNOSIS — D473 Essential (hemorrhagic) thrombocythemia: Secondary | ICD-10-CM | POA: Diagnosis not present

## 2014-10-26 LAB — VITAMIN B12: Vitamin B-12: 474 pg/mL (ref 180–914)

## 2014-10-26 LAB — CBC
HCT: 28.8 % — ABNORMAL LOW (ref 40.0–52.0)
Hemoglobin: 8.7 g/dL — ABNORMAL LOW (ref 13.0–18.0)
MCH: 19.5 pg — ABNORMAL LOW (ref 26.0–34.0)
MCHC: 30 g/dL — ABNORMAL LOW (ref 32.0–36.0)
MCV: 64.9 fL — ABNORMAL LOW (ref 80.0–100.0)
PLATELETS: 863 10*3/uL — AB (ref 150–440)
RBC: 4.45 MIL/uL (ref 4.40–5.90)
RDW: 28.6 % — ABNORMAL HIGH (ref 11.5–14.5)
WBC: 8 10*3/uL (ref 3.8–10.6)

## 2014-10-26 LAB — FOLATE: Folate: 47.8 ng/mL (ref >5.9)

## 2014-10-26 LAB — FERRITIN: FERRITIN: 9 ng/mL — AB (ref 24–336)

## 2014-10-26 LAB — LACTATE DEHYDROGENASE: LDH: 105 U/L (ref 98–192)

## 2014-10-26 LAB — IRON AND TIBC
IRON: 38 ug/dL — AB (ref 45–182)
SATURATION RATIOS: 7 % — AB (ref 17.9–39.5)
TIBC: 565 ug/dL — AB (ref 250–450)
UIBC: 528 ug/dL

## 2014-10-26 LAB — DAT, POLYSPECIFIC AHG (ARMC ONLY): Polyspecific AHG test: NEGATIVE

## 2014-10-26 NOTE — Progress Notes (Signed)
Patient had a recent hospitalization adn is now at [facility] for PT.  When the resident did a lab check on patient his platelets resulted elevated.

## 2014-10-27 DIAGNOSIS — D473 Essential (hemorrhagic) thrombocythemia: Secondary | ICD-10-CM | POA: Diagnosis not present

## 2014-10-27 DIAGNOSIS — D638 Anemia in other chronic diseases classified elsewhere: Secondary | ICD-10-CM | POA: Diagnosis not present

## 2014-10-27 DIAGNOSIS — J810 Acute pulmonary edema: Secondary | ICD-10-CM | POA: Diagnosis not present

## 2014-10-27 LAB — HAPTOGLOBIN: HAPTOGLOBIN: 242 mg/dL — AB (ref 34–200)

## 2014-10-28 DIAGNOSIS — I639 Cerebral infarction, unspecified: Secondary | ICD-10-CM | POA: Diagnosis not present

## 2014-10-28 DIAGNOSIS — I4510 Unspecified right bundle-branch block: Secondary | ICD-10-CM | POA: Diagnosis not present

## 2014-10-28 DIAGNOSIS — I340 Nonrheumatic mitral (valve) insufficiency: Secondary | ICD-10-CM | POA: Diagnosis not present

## 2014-10-28 DIAGNOSIS — I214 Non-ST elevation (NSTEMI) myocardial infarction: Secondary | ICD-10-CM | POA: Insufficient documentation

## 2014-10-28 DIAGNOSIS — I10 Essential (primary) hypertension: Secondary | ICD-10-CM | POA: Diagnosis not present

## 2014-11-02 ENCOUNTER — Inpatient Hospital Stay (HOSPITAL_BASED_OUTPATIENT_CLINIC_OR_DEPARTMENT_OTHER): Payer: Medicare Other | Admitting: Oncology

## 2014-11-02 ENCOUNTER — Inpatient Hospital Stay: Payer: Medicare Other

## 2014-11-02 VITALS — BP 134/63 | HR 59

## 2014-11-02 VITALS — BP 151/67 | HR 64 | Temp 98.8°F | Resp 16 | Wt 153.9 lb

## 2014-11-02 DIAGNOSIS — R5383 Other fatigue: Secondary | ICD-10-CM | POA: Diagnosis not present

## 2014-11-02 DIAGNOSIS — Z79899 Other long term (current) drug therapy: Secondary | ICD-10-CM | POA: Diagnosis not present

## 2014-11-02 DIAGNOSIS — J90 Pleural effusion, not elsewhere classified: Secondary | ICD-10-CM

## 2014-11-02 DIAGNOSIS — H918X2 Other specified hearing loss, left ear: Secondary | ICD-10-CM

## 2014-11-02 DIAGNOSIS — R531 Weakness: Secondary | ICD-10-CM

## 2014-11-02 DIAGNOSIS — Z87891 Personal history of nicotine dependence: Secondary | ICD-10-CM

## 2014-11-02 DIAGNOSIS — D649 Anemia, unspecified: Secondary | ICD-10-CM

## 2014-11-02 DIAGNOSIS — I10 Essential (primary) hypertension: Secondary | ICD-10-CM

## 2014-11-02 DIAGNOSIS — D473 Essential (hemorrhagic) thrombocythemia: Secondary | ICD-10-CM | POA: Diagnosis not present

## 2014-11-02 DIAGNOSIS — I252 Old myocardial infarction: Secondary | ICD-10-CM | POA: Diagnosis not present

## 2014-11-02 DIAGNOSIS — E119 Type 2 diabetes mellitus without complications: Secondary | ICD-10-CM

## 2014-11-02 DIAGNOSIS — D509 Iron deficiency anemia, unspecified: Secondary | ICD-10-CM | POA: Diagnosis not present

## 2014-11-02 DIAGNOSIS — I214 Non-ST elevation (NSTEMI) myocardial infarction: Secondary | ICD-10-CM | POA: Diagnosis not present

## 2014-11-02 DIAGNOSIS — M129 Arthropathy, unspecified: Secondary | ICD-10-CM

## 2014-11-02 DIAGNOSIS — G4730 Sleep apnea, unspecified: Secondary | ICD-10-CM

## 2014-11-02 DIAGNOSIS — Z8673 Personal history of transient ischemic attack (TIA), and cerebral infarction without residual deficits: Secondary | ICD-10-CM

## 2014-11-02 DIAGNOSIS — Z8719 Personal history of other diseases of the digestive system: Secondary | ICD-10-CM

## 2014-11-02 MED ORDER — FERUMOXYTOL INJECTION 510 MG/17 ML
510.0000 mg | Freq: Once | INTRAVENOUS | Status: AC
  Administered 2014-11-02: 510 mg via INTRAVENOUS
  Filled 2014-11-02: qty 17

## 2014-11-02 MED ORDER — SODIUM CHLORIDE 0.9 % IV SOLN
Freq: Once | INTRAVENOUS | Status: AC
  Administered 2014-11-02: 15:00:00 via INTRAVENOUS
  Filled 2014-11-02: qty 1000

## 2014-11-02 NOTE — Progress Notes (Signed)
Patient is now home from the facility he was staying.

## 2014-11-02 NOTE — Progress Notes (Signed)
[place]  Telephone:(336) [phone] Fax:(336) [phone]  ID: KESLER WICKHAM OB: [birth year]  MR#: [id number]  ZGY#:[id number]  Patient Care Team: Isaias Cowman, MD as PCP - General (Cardiology)  CHIEF COMPLAINT:  Chief Complaint  Patient presents with  . New Evaluation    elevated platelet count on recent labs    INTERVAL HISTORY:  Patient is a 74 year old male who was found to have a significantly elevated platelet count on routine blood work. He was recently admitted to the hospital in August 2016 with GI bleed of unknown source  as well as a non-ST elevation MI.  He currently feels well and is asymptomatic. He has no neurologic complaints. He denies any fevers. He continues to feel weak and fatigued. He denies any chest pain or shortness of breath. He denies any nausea, vomiting, consultation, or diarrhea. He denies any further melanoma or hematochezia. Patient offers no further specific complaints.  REVIEW OF SYSTEMS:   Review of Systems  Constitutional: Positive for malaise/fatigue. Negative for fever.  Respiratory: Negative.   Cardiovascular: Negative.   Gastrointestinal: Negative.  Negative for blood in stool and melena.  Skin: Negative.   Neurological: Positive for weakness.    As per HPI. Otherwise, a complete review of systems is negatve.  PAST MEDICAL HISTORY: Past Medical History  Diagnosis Date  . Diabetes mellitus without complication   . Hypertension   . Hearing loss     left ear  . TIA (transient ischemic attack)   . Hypothyroidism   . Sleep apnea   . Stroke   . Arthritis     PAST SURGICAL HISTORY: Past Surgical History  Procedure Laterality Date  . Nose surgery    . Appendectomy    . Eye surgery    . Fracture surgery      FAMILY HISTORY Family History  Problem Relation Age of Onset  . Hypertension Mother   . Lung cancer Father   . Hypertension Brother        ADVANCED DIRECTIVES:    HEALTH MAINTENANCE: Social  History  Substance Use Topics  . Smoking status: Former Smoker    Quit date: 10/03/1983  . Smokeless tobacco: Never Used  . Alcohol Use: Yes     Comment: rarely     Colonoscopy:  PAP:  Bone density:  Lipid panel:  Allergies  Allergen Reactions  . Sulfa Antibiotics Itching and Hives  . Amoxicillin Hives  . Ampicillin Hives  . Keflet [Cephalexin] Itching  . Lisinopril Other (See Comments)  . Losartan Other (See Comments)  . Sulfamethoxazole-Trimethoprim Other (See Comments)  . Cephalosporins Hives    Current Outpatient Prescriptions  Medication Sig Dispense Refill  . Cholecalciferol (VITAMIN D3) 1000 UNITS CAPS Take 2,000 Units by mouth.    . feeding supplement, ENSURE ENLIVE, (ENSURE ENLIVE) LIQD Take 237 mLs by mouth 2 (two) times daily between meals. 237 mL 12  . furosemide (LASIX) 20 MG tablet Take 1 tablet (20 mg total) by mouth 2 (two) times daily. 30 tablet 0  . isosorbide mononitrate (IMDUR) 60 MG 24 hr tablet Take 1 tablet (60 mg total) by mouth daily. 30 tablet 0  . levothyroxine (SYNTHROID, LEVOTHROID) 100 MCG tablet Take 100 mcg by mouth every morning.    . magnesium oxide (MAG-OX) 400 MG tablet Take 400 mg by mouth at bedtime.    . metFORMIN (GLUCOPHAGE) 500 MG tablet Take 500 mg by mouth daily with breakfast.    . metFORMIN (GLUCOPHAGE) 500 MG tablet Take 1,500  mg by mouth daily with supper.    . metoprolol tartrate (LOPRESSOR) 25 MG tablet Take 12.5 mg by mouth 2 (two) times daily.     . Multiple Vitamins-Minerals (MULTI FOR HIM 50+ PO) Take 1 tablet by mouth daily.    . Omega-3 Fatty Acids (FISH OIL) 1000 MG CAPS Take 1 capsule by mouth daily.    . potassium chloride 20 MEQ TBCR Take 20 mEq by mouth daily. 30 tablet 0  . Red Yeast Rice 600 MG TABS Take 2 tablets by mouth daily.     No current facility-administered medications for this visit.    OBJECTIVE: Filed Vitals:   10/26/14 1217  BP: 129/68  Pulse: 59  Temp: 96.1 F (35.6 C)  Resp: 16     Body  mass index is 23.53 kg/(m^2).    ECOG FS:1 - Symptomatic but completely ambulatory  General: Well-developed, well-nourished, no acute distress. Eyes: Pink conjunctiva, anicteric sclera. HEENT: Normocephalic, moist mucous membranes, clear oropharnyx. Lungs: Clear to auscultation bilaterally. Heart: Regular rate and rhythm. No rubs, murmurs, or gallops. Abdomen: Soft, nontender, nondistended. No organomegaly noted, normoactive bowel sounds. Musculoskeletal: No edema, cyanosis, or clubbing. Neuro: Alert, answering all questions appropriately. Cranial nerves grossly intact. Skin: No rashes or petechiae noted. Psych: Normal affect. Lymphatics: No cervical, calvicular, axillary or inguinal LAD.   LAB RESULTS:  Lab Results  Component Value Date   NA 137 10/09/2014   K 3.3* 10/09/2014   CL 99* 10/09/2014   CO2 29 10/09/2014   GLUCOSE 134* 10/09/2014   BUN 17 10/09/2014   CREATININE 0.73 10/09/2014   CALCIUM 8.5* 10/09/2014   PROT 7.6 10/03/2014   ALBUMIN 4.1 10/03/2014   AST 66* 10/03/2014   ALT 16* 10/03/2014   ALKPHOS 44 10/03/2014   BILITOT 0.2* 10/03/2014   GFRNONAA >60 10/09/2014   GFRAA >60 10/09/2014    Lab Results  Component Value Date   WBC 8.0 10/26/2014   NEUTROABS 22.4* 10/05/2014   HGB 8.7* 10/26/2014   HCT 28.8* 10/26/2014   MCV 64.9* 10/26/2014   PLT 863* 10/26/2014     STUDIES: Dg Chest 1 View  10/04/2014   CLINICAL DATA:  Confusion.  EXAM: CHEST  1 VIEW  COMPARISON:  Chest radiograph 10/03/2014  FINDINGS: Monitoring leads overlie the patient. Stable enlarged cardiac and mediastinal contours. Interval increase in diffuse bilateral interstitial pulmonary opacities with more focal consolidative opacities in the mid lungs bilaterally. Apparent lucency within the right lung apex.  IMPRESSION: There is apparent lucency within the apex of the right lung which is nonspecific and may represent artifact from positioning of soft tissues however small right apical  pneumothorax is an additional consideration. Recommend correlation with decubitus views.  Interval increase in diffuse bilateral interstitial consolidative pulmonary opacities most compatible with worsening pulmonary edema. Infection could have a similar appearance.  These results were called by telephone at the time of interpretation on 10/04/2014 at 9:34 am to Dr. Laverle Hobby , who verbally acknowledged these results.   Electronically Signed   By: Lovey Newcomer M.D.   On: 10/04/2014 09:35   Dg Chest 2 View  10/08/2014   CLINICAL DATA:  Shortness of breath, followup infiltrates  EXAM: CHEST - 2 VIEW  COMPARISON:  10/04/2014  FINDINGS: Cardiac shadow is at the upper limits of normal in size. Mild persistent infiltrative changes noted in the right mid and lower lung although the overall appearance has improved significantly in the interval from the prior study. Small bilateral pleural effusions  are seen. No acute bony abnormality is noted  IMPRESSION: Significant improvement in bilateral infiltrates when compare with the prior exam.   Electronically Signed   By: Inez Catalina M.D.   On: 10/08/2014 10:50   Ct Chest Wo Contrast  10/04/2014   CLINICAL DATA:  Abnormal chest x-ray with changes of pulmonary edema on clinical exam  EXAM: CT CHEST WITHOUT CONTRAST  TECHNIQUE: Multidetector CT imaging of the chest was performed following the standard protocol without IV contrast.  COMPARISON:  Film from earlier in the same day  FINDINGS: Diffuse bilateral infiltrates are noted which have worsened in the interval from the prior chest x-ray particularly in the lower lobes bilaterally. Small pleural effusions are noted. Aortic calcifications and coronary calcifications are seen. No hilar or mediastinal adenopathy is noted the visualized upper abdomen is within normal limits. Degenerative change of the thoracic spine is noted. No other bony abnormality is seen.  IMPRESSION: Significant increase in alveolar infiltrates  when compared with the prior exam. This is most noted in the lower lobes bilaterally.  No pneumothorax to account abnormality on recent chest x-ray.   Electronically Signed   By: Inez Catalina M.D.   On: 10/04/2014 15:18    ASSESSMENT:  Iron deficiency anemia, thrombocytosis.  PLAN:     1. Iron deficiency anemia: likely secondary to recent GI bleed. Patient was evaluated by GI, but no colonoscopy was performed secondary to his MI. Plan is to wait at least 6 weeks prior to luminal investigation. Patient was found to have significantly decreased iron stores as well as hemoglobin today and will benefit from IV Feraheme.  The remainder of his laboratory work was either negative or within normal limits. Return to clinic in 1 week for discussion of his results and initiation of IV iron. 2. Thrombocytosis: Likely reactive as well as secondary to iron deficiency and GI bleed. JAK-2 mutation was ordered to assess for essential thrombocytosis and these results are pending. Return to clinic as above for IV iron.  3. Recent MI: Treatment per cardiology.  Patient expressed understanding and was in agreement with this plan. He also understands that He can call clinic at any time with any questions, concerns, or complaints.   No matching staging information was found for the patient.  Lloyd Huger, MD   11/02/2014 11:09 AM

## 2014-11-03 DIAGNOSIS — I214 Non-ST elevation (NSTEMI) myocardial infarction: Secondary | ICD-10-CM | POA: Diagnosis not present

## 2014-11-04 DIAGNOSIS — I214 Non-ST elevation (NSTEMI) myocardial infarction: Secondary | ICD-10-CM | POA: Diagnosis not present

## 2014-11-05 ENCOUNTER — Ambulatory Visit: Payer: Medicare Other | Admitting: Oncology

## 2014-11-05 ENCOUNTER — Ambulatory Visit: Payer: Medicare Other

## 2014-11-05 DIAGNOSIS — I214 Non-ST elevation (NSTEMI) myocardial infarction: Secondary | ICD-10-CM | POA: Diagnosis not present

## 2014-11-09 ENCOUNTER — Inpatient Hospital Stay: Payer: Medicare Other

## 2014-11-09 VITALS — BP 129/65 | HR 58 | Temp 96.7°F | Resp 16

## 2014-11-09 DIAGNOSIS — D473 Essential (hemorrhagic) thrombocythemia: Secondary | ICD-10-CM | POA: Diagnosis not present

## 2014-11-09 DIAGNOSIS — R5383 Other fatigue: Secondary | ICD-10-CM | POA: Diagnosis not present

## 2014-11-09 DIAGNOSIS — I252 Old myocardial infarction: Secondary | ICD-10-CM | POA: Diagnosis not present

## 2014-11-09 DIAGNOSIS — R531 Weakness: Secondary | ICD-10-CM | POA: Diagnosis not present

## 2014-11-09 DIAGNOSIS — D649 Anemia, unspecified: Secondary | ICD-10-CM

## 2014-11-09 DIAGNOSIS — D509 Iron deficiency anemia, unspecified: Secondary | ICD-10-CM | POA: Diagnosis not present

## 2014-11-09 DIAGNOSIS — Z79899 Other long term (current) drug therapy: Secondary | ICD-10-CM | POA: Diagnosis not present

## 2014-11-09 MED ORDER — FERUMOXYTOL INJECTION 510 MG/17 ML
510.0000 mg | Freq: Once | INTRAVENOUS | Status: AC
  Administered 2014-11-09: 510 mg via INTRAVENOUS
  Filled 2014-11-09: qty 17

## 2014-11-09 MED ORDER — SODIUM CHLORIDE 0.9 % IV SOLN
Freq: Once | INTRAVENOUS | Status: AC
  Administered 2014-11-09: 14:00:00 via INTRAVENOUS
  Filled 2014-11-09: qty 1000

## 2014-11-09 NOTE — Progress Notes (Signed)
[name]  Telephone:(336) [phone] Fax:(336) [phone]  [name] OB: [birth year]  MR#: [id number]  WIO#:[id number]  Patient Care Team: Isaias Cowman, MD as PCP - General (Cardiology)  CHIEF COMPLAINT:  Chief Complaint  Patient presents with  . Follow-up    discuss lab results     INTERVAL HISTORY:  Patient returns to clinic today for further evaluation, discussion of her laboratory work, and consideration of IV Feraheme. He continues to feel well and is asymptomatic. He has no neurologic complaints. He denies any fevers. He continues to feel weak and fatigued. He denies any chest pain or shortness of breath. He denies any nausea, vomiting, consultation, or diarrhea. He denies any further melanoma or hematochezia. Patient offers no further specific complaints.  REVIEW OF SYSTEMS:   Review of Systems  Constitutional: Positive for malaise/fatigue. Negative for fever.  Respiratory: Negative.   Cardiovascular: Negative.   Gastrointestinal: Negative.  Negative for blood in stool and melena.  Skin: Negative.   Neurological: Positive for weakness.    As per HPI. Otherwise, a complete review of systems is negatve.  PAST MEDICAL HISTORY: Past Medical History  Diagnosis Date  . Diabetes mellitus without complication   . Hypertension   . Hearing loss     left ear  . TIA (transient ischemic attack)   . Hypothyroidism   . Sleep apnea   . Stroke   . Arthritis     PAST SURGICAL HISTORY: Past Surgical History  Procedure Laterality Date  . Nose surgery    . Appendectomy    . Eye surgery    . Fracture surgery      FAMILY HISTORY Family History  Problem Relation Age of Onset  . Hypertension Mother   . Lung cancer Father   . Hypertension Brother        ADVANCED DIRECTIVES:    HEALTH MAINTENANCE: Social History  Substance Use Topics  . Smoking status: Former Smoker    Quit date: 10/03/1983  . Smokeless tobacco: Never Used  . Alcohol  Use: Yes     Comment: rarely     Colonoscopy:  PAP:  Bone density:  Lipid panel:  Allergies  Allergen Reactions  . Sulfa Antibiotics Itching and Hives  . Amoxicillin Hives  . Ampicillin Hives  . Keflet [Cephalexin] Itching  . Lisinopril Other (See Comments)  . Losartan Other (See Comments)  . Sulfamethoxazole-Trimethoprim Other (See Comments)  . Cephalosporins Hives    Current Outpatient Prescriptions  Medication Sig Dispense Refill  . Cholecalciferol (VITAMIN D3) 1000 UNITS CAPS Take 2,000 Units by mouth.    . feeding supplement, ENSURE ENLIVE, (ENSURE ENLIVE) LIQD Take 237 mLs by mouth 2 (two) times daily between meals. 237 mL 12  . furosemide (LASIX) 20 MG tablet Take 1 tablet (20 mg total) by mouth 2 (two) times daily. 30 tablet 0  . isosorbide mononitrate (IMDUR) 60 MG 24 hr tablet Take 1 tablet (60 mg total) by mouth daily. 30 tablet 0  . levothyroxine (SYNTHROID, LEVOTHROID) 100 MCG tablet Take 100 mcg by mouth every morning.    . magnesium oxide (MAG-OX) 400 MG tablet Take 400 mg by mouth at bedtime.    . metFORMIN (GLUCOPHAGE) 500 MG tablet Take 500 mg by mouth daily with breakfast.    . metFORMIN (GLUCOPHAGE) 500 MG tablet Take 1,500 mg by mouth daily with supper.    . metoprolol tartrate (LOPRESSOR) 25 MG tablet Take 12.5 mg by mouth 2 (two) times daily.     Marland Kitchen  Multiple Vitamins-Minerals (MULTI FOR HIM 50+ PO) Take 1 tablet by mouth daily.    . Omega-3 Fatty Acids (FISH OIL) 1000 MG CAPS Take 1 capsule by mouth daily.    . Red Yeast Rice 600 MG TABS Take 2 tablets by mouth daily.     No current facility-administered medications for this visit.    OBJECTIVE: Filed Vitals:   11/02/14 1409  BP: 151/67  Pulse: 64  Temp: 98.8 F (37.1 C)  Resp: 16     Body mass index is 24.01 kg/(m^2).    ECOG FS:1 - Symptomatic but completely ambulatory  General: Well-developed, well-nourished, no acute distress. Eyes: Pink conjunctiva, anicteric sclera. Lungs: Clear to  auscultation bilaterally. Heart: Regular rate and rhythm. No rubs, murmurs, or gallops. Abdomen: Soft, nontender, nondistended. No organomegaly noted, normoactive bowel sounds. Musculoskeletal: No edema, cyanosis, or clubbing. Neuro: Alert, answering all questions appropriately. Cranial nerves grossly intact. Skin: No rashes or petechiae noted. Psych: Normal affect.   LAB RESULTS:  Lab Results  Component Value Date   NA 137 10/09/2014   K 3.3* 10/09/2014   CL 99* 10/09/2014   CO2 29 10/09/2014   GLUCOSE 134* 10/09/2014   BUN 17 10/09/2014   CREATININE 0.73 10/09/2014   CALCIUM 8.5* 10/09/2014   PROT 7.6 10/03/2014   ALBUMIN 4.1 10/03/2014   AST 66* 10/03/2014   ALT 16* 10/03/2014   ALKPHOS 44 10/03/2014   BILITOT 0.2* 10/03/2014   GFRNONAA >60 10/09/2014   GFRAA >60 10/09/2014    Lab Results  Component Value Date   WBC 8.0 10/26/2014   NEUTROABS 22.4* 10/05/2014   HGB 8.7* 10/26/2014   HCT 28.8* 10/26/2014   MCV 64.9* 10/26/2014   PLT 863* 10/26/2014     STUDIES: No results found.  ASSESSMENT:  Iron deficiency anemia, thrombocytosis.  PLAN:     1. Iron deficiency anemia: Likely secondary to recent GI bleed. Patient was evaluated by GI, but no colonoscopy was performed secondary to his MI. Plan is to wait at least 6 weeks prior to luminal investigation. Patient was found to have significantly decreased iron stores as well as hemoglobin today and will benefit from IV Feraheme.  The remainder of his laboratory work was either negative or within normal limits. Proceed with 510 mg IV Feraheme today and then return to clinic in 1 week for a second infusion. Patient will then return to clinic in 2 months for repeat laboratory work and further evaluation. 2. Thrombocytosis: Likely reactive as well as secondary to iron deficiency and GI bleed. Will order JAK-2 mutation in the future if does not resolve with IV iron. 3. Recent MI: Treatment per cardiology.  Patient  expressed understanding and was in agreement with this plan. He also understands that He can call clinic at any time with any questions, concerns, or complaints.    Lloyd Huger, MD   11/09/2014 1:53 PM

## 2014-11-19 ENCOUNTER — Telehealth: Payer: Self-pay | Admitting: *Deleted

## 2014-11-19 DIAGNOSIS — D508 Other iron deficiency anemias: Secondary | ICD-10-CM

## 2014-11-19 NOTE — Telephone Encounter (Signed)
Called inquiring about labs drawn on 10/26/14 for a hereditary marker. I do to see anything ordered reflecting her request. His next appt is 01/11/15. Last note says that if Iron does not take care of anemia, JAK 2 will be checked in future

## 2014-11-22 DIAGNOSIS — Z23 Encounter for immunization: Secondary | ICD-10-CM | POA: Diagnosis not present

## 2015-01-11 ENCOUNTER — Inpatient Hospital Stay: Payer: Medicare Other

## 2015-01-11 ENCOUNTER — Inpatient Hospital Stay: Payer: Medicare Other | Attending: Oncology | Admitting: Oncology

## 2015-01-11 VITALS — BP 146/69 | HR 62 | Temp 97.2°F | Resp 16 | Wt 162.0 lb

## 2015-01-11 DIAGNOSIS — D509 Iron deficiency anemia, unspecified: Secondary | ICD-10-CM

## 2015-01-11 DIAGNOSIS — Z87891 Personal history of nicotine dependence: Secondary | ICD-10-CM

## 2015-01-11 DIAGNOSIS — I10 Essential (primary) hypertension: Secondary | ICD-10-CM | POA: Diagnosis not present

## 2015-01-11 DIAGNOSIS — H918X2 Other specified hearing loss, left ear: Secondary | ICD-10-CM

## 2015-01-11 DIAGNOSIS — E119 Type 2 diabetes mellitus without complications: Secondary | ICD-10-CM | POA: Insufficient documentation

## 2015-01-11 DIAGNOSIS — M129 Arthropathy, unspecified: Secondary | ICD-10-CM | POA: Diagnosis not present

## 2015-01-11 DIAGNOSIS — E039 Hypothyroidism, unspecified: Secondary | ICD-10-CM | POA: Diagnosis not present

## 2015-01-11 DIAGNOSIS — I252 Old myocardial infarction: Secondary | ICD-10-CM | POA: Diagnosis not present

## 2015-01-11 DIAGNOSIS — Z8673 Personal history of transient ischemic attack (TIA), and cerebral infarction without residual deficits: Secondary | ICD-10-CM | POA: Diagnosis not present

## 2015-01-11 DIAGNOSIS — Z7984 Long term (current) use of oral hypoglycemic drugs: Secondary | ICD-10-CM

## 2015-01-11 DIAGNOSIS — Z862 Personal history of diseases of the blood and blood-forming organs and certain disorders involving the immune mechanism: Secondary | ICD-10-CM | POA: Insufficient documentation

## 2015-01-11 DIAGNOSIS — R531 Weakness: Secondary | ICD-10-CM | POA: Diagnosis not present

## 2015-01-11 DIAGNOSIS — G4730 Sleep apnea, unspecified: Secondary | ICD-10-CM | POA: Diagnosis not present

## 2015-01-11 DIAGNOSIS — Z79899 Other long term (current) drug therapy: Secondary | ICD-10-CM

## 2015-01-11 DIAGNOSIS — R5383 Other fatigue: Secondary | ICD-10-CM | POA: Diagnosis not present

## 2015-01-11 DIAGNOSIS — D508 Other iron deficiency anemias: Secondary | ICD-10-CM

## 2015-01-11 LAB — IRON AND TIBC
IRON: 52 ug/dL (ref 45–182)
Saturation Ratios: 14 % — ABNORMAL LOW (ref 17.9–39.5)
TIBC: 369 ug/dL (ref 250–450)
UIBC: 317 ug/dL

## 2015-01-11 LAB — CBC WITH DIFFERENTIAL/PLATELET
BASOS ABS: 0.1 10*3/uL (ref 0–0.1)
Basophils Relative: 1 %
EOS PCT: 3 %
Eosinophils Absolute: 0.2 10*3/uL (ref 0–0.7)
HCT: 37 % — ABNORMAL LOW (ref 40.0–52.0)
HEMOGLOBIN: 12.4 g/dL — AB (ref 13.0–18.0)
LYMPHS ABS: 1.6 10*3/uL (ref 1.0–3.6)
LYMPHS PCT: 18 %
MCH: 28.1 pg (ref 26.0–34.0)
MCHC: 33.5 g/dL (ref 32.0–36.0)
MCV: 84 fL (ref 80.0–100.0)
Monocytes Absolute: 0.8 10*3/uL (ref 0.2–1.0)
Monocytes Relative: 9 %
NEUTROS PCT: 69 %
Neutro Abs: 6.2 10*3/uL (ref 1.4–6.5)
PLATELETS: 396 10*3/uL (ref 150–440)
RBC: 4.41 MIL/uL (ref 4.40–5.90)
RDW: 24.2 % — ABNORMAL HIGH (ref 11.5–14.5)
WBC: 8.9 10*3/uL (ref 3.8–10.6)

## 2015-01-11 LAB — FERRITIN: FERRITIN: 41 ng/mL (ref 24–336)

## 2015-01-12 LAB — HEMOGLOBINOPATHY EVALUATION
HGB A: 98.1 % — AB (ref 94.0–98.0)
HGB C: 0 %
Hgb A2 Quant: 1.9 % (ref 0.7–3.1)
Hgb F Quant: 0 % (ref 0.0–2.0)
Hgb S Quant: 0 %

## 2015-01-17 NOTE — Progress Notes (Signed)
[place]  Telephone:(336) [phone] Fax:(336) [phone]  ID: REVIS BOTZ OB: 25-Apr-1940  MR#: [id number]  [id number]  Patient Care Team: Isaias Cowman, MD as PCP - General (Cardiology)  CHIEF COMPLAINT:  Chief Complaint  Patient presents with  . Anemia    INTERVAL HISTORY:  Patient returns to clinic today for further evaluation, discussion of her laboratory work, and consideration of IV Feraheme. He continues to have weakness and fatigue which is chronic and unchanged.  He has no neurologic complaints. He denies any fevers. He denies any chest pain or shortness of breath. He denies any nausea, vomiting, consultation, or diarrhea. He denies any further melena or hematochezia. Patient offers no further specific complaints.  REVIEW OF SYSTEMS:   Review of Systems  Constitutional: Positive for malaise/fatigue. Negative for fever.  Respiratory: Negative.   Cardiovascular: Negative.   Gastrointestinal: Negative.  Negative for blood in stool and melena.  Musculoskeletal: Negative.   Skin: Negative.   Neurological: Negative.     As per HPI. Otherwise, a complete review of systems is negatve.  PAST MEDICAL HISTORY: Past Medical History  Diagnosis Date  . Diabetes mellitus without complication   . Hypertension   . Hearing loss     left ear  . TIA (transient ischemic attack)   . Hypothyroidism   . Sleep apnea   . Stroke   . Arthritis     PAST SURGICAL HISTORY: Past Surgical History  Procedure Laterality Date  . Nose surgery    . Appendectomy    . Eye surgery    . Fracture surgery      FAMILY HISTORY Family History  Problem Relation Age of Onset  . Hypertension Mother   . Lung cancer Father   . Hypertension Brother        ADVANCED DIRECTIVES:    HEALTH MAINTENANCE: Social History  Substance Use Topics  . Smoking status: Former Smoker    Quit date: 10/03/1983  . Smokeless tobacco: Never Used  . Alcohol Use: Yes     Comment:  rarely     Colonoscopy:  PAP:  Bone density:  Lipid panel:  Allergies  Allergen Reactions  . Sulfa Antibiotics Itching and Hives  . Amoxicillin Hives  . Ampicillin Hives  . Keflet [Cephalexin] Itching  . Lisinopril Other (See Comments)  . Losartan Other (See Comments)  . Sulfamethoxazole-Trimethoprim Other (See Comments)  . Cephalosporins Hives    Current Outpatient Prescriptions  Medication Sig Dispense Refill  . Cholecalciferol (VITAMIN D3) 1000 UNITS CAPS Take 2,000 Units by mouth.    . furosemide (LASIX) 20 MG tablet Take 1 tablet (20 mg total) by mouth 2 (two) times daily. 30 tablet 0  . isosorbide mononitrate (IMDUR) 60 MG 24 hr tablet Take 1 tablet (60 mg total) by mouth daily. 30 tablet 0  . levothyroxine (SYNTHROID, LEVOTHROID) 100 MCG tablet Take 100 mcg by mouth every morning.    . magnesium oxide (MAG-OX) 400 MG tablet Take 400 mg by mouth at bedtime.    . metFORMIN (GLUCOPHAGE) 500 MG tablet Take 500 mg by mouth daily with breakfast.    . metFORMIN (GLUCOPHAGE) 500 MG tablet Take 1,500 mg by mouth daily with supper.    . metoprolol tartrate (LOPRESSOR) 25 MG tablet Take 12.5 mg by mouth 2 (two) times daily.     . Multiple Vitamins-Minerals (MULTI FOR HIM 50+ PO) Take 1 tablet by mouth daily.    . Omega-3 Fatty Acids (FISH OIL) 1000 MG CAPS Take 1  capsule by mouth daily.    . Red Yeast Rice 600 MG TABS Take 2 tablets by mouth daily.    . feeding supplement, ENSURE ENLIVE, (ENSURE ENLIVE) LIQD Take 237 mLs by mouth 2 (two) times daily between meals. (Patient not taking: Reported on 01/11/2015) 237 mL 12   No current facility-administered medications for this visit.    OBJECTIVE: Filed Vitals:   01/11/15 1358  BP: 146/69  Pulse: 62  Temp: 97.2 F (36.2 C)  Resp: 16     Body mass index is 25.28 kg/(m^2).    ECOG FS:1 - Symptomatic but completely ambulatory  General: Well-developed, well-nourished, no acute distress. Eyes: Pink conjunctiva, anicteric  sclera. Lungs: Clear to auscultation bilaterally. Heart: Regular rate and rhythm. No rubs, murmurs, or gallops. Abdomen: Soft, nontender, nondistended. No organomegaly noted, normoactive bowel sounds. Musculoskeletal: No edema, cyanosis, or clubbing. Neuro: Alert, answering all questions appropriately. Cranial nerves grossly intact. Skin: No rashes or petechiae noted. Psych: Normal affect.   LAB RESULTS:  Lab Results  Component Value Date   NA 137 10/09/2014   K 3.3* 10/09/2014   CL 99* 10/09/2014   CO2 29 10/09/2014   GLUCOSE 134* 10/09/2014   BUN 17 10/09/2014   CREATININE 0.73 10/09/2014   CALCIUM 8.5* 10/09/2014   PROT 7.6 10/03/2014   ALBUMIN 4.1 10/03/2014   AST 66* 10/03/2014   ALT 16* 10/03/2014   ALKPHOS 44 10/03/2014   BILITOT 0.2* 10/03/2014   GFRNONAA >60 10/09/2014   GFRAA >60 10/09/2014    Lab Results  Component Value Date   WBC 8.9 01/11/2015   NEUTROABS 6.2 01/11/2015   HGB 12.4* 01/11/2015   HCT 37.0* 01/11/2015   MCV 84.0 01/11/2015   PLT 396 01/11/2015     STUDIES: No results found.  ASSESSMENT:  Iron deficiency anemia, thrombocytosis.  PLAN:     1. Iron deficiency anemia: Resolved. Patient's hemoglobin and iron stores are essentially within normal limits. Patient was previously evaluated by GI, but no colonoscopy was performed secondary to his MI.  It is unclear when his colonoscopy as scheduled. The remainder of his laboratory work was either negative or within normal limits. No intervention is needed at this time. Return to clinic in 4 months for repeat laboratory work and further evaluation. 2. Thrombocytosis: Resolved. Likely reactive as well as secondary to iron deficiency and GI bleed.  3. Recent MI: Treatment per cardiology.  Patient expressed understanding and was in agreement with this plan. He also understands that He can call clinic at any time with any questions, concerns, or complaints.    Lloyd Huger, MD   01/17/2015  8:05 AM

## 2015-01-27 DIAGNOSIS — E039 Hypothyroidism, unspecified: Secondary | ICD-10-CM | POA: Diagnosis not present

## 2015-01-27 DIAGNOSIS — E119 Type 2 diabetes mellitus without complications: Secondary | ICD-10-CM | POA: Diagnosis not present

## 2015-02-03 DIAGNOSIS — I10 Essential (primary) hypertension: Secondary | ICD-10-CM | POA: Diagnosis not present

## 2015-02-03 DIAGNOSIS — E039 Hypothyroidism, unspecified: Secondary | ICD-10-CM | POA: Diagnosis not present

## 2015-02-03 DIAGNOSIS — E119 Type 2 diabetes mellitus without complications: Secondary | ICD-10-CM | POA: Diagnosis not present

## 2015-04-26 ENCOUNTER — Inpatient Hospital Stay: Payer: Medicare Other

## 2015-04-26 ENCOUNTER — Inpatient Hospital Stay: Payer: Medicare Other | Attending: Oncology | Admitting: Oncology

## 2015-04-26 VITALS — BP 131/65 | HR 68

## 2015-04-26 VITALS — BP 147/91 | Temp 98.3°F | Wt 160.5 lb

## 2015-04-26 DIAGNOSIS — G4730 Sleep apnea, unspecified: Secondary | ICD-10-CM | POA: Insufficient documentation

## 2015-04-26 DIAGNOSIS — D649 Anemia, unspecified: Secondary | ICD-10-CM

## 2015-04-26 DIAGNOSIS — E039 Hypothyroidism, unspecified: Secondary | ICD-10-CM | POA: Insufficient documentation

## 2015-04-26 DIAGNOSIS — M1990 Unspecified osteoarthritis, unspecified site: Secondary | ICD-10-CM | POA: Insufficient documentation

## 2015-04-26 DIAGNOSIS — D509 Iron deficiency anemia, unspecified: Secondary | ICD-10-CM

## 2015-04-26 DIAGNOSIS — E119 Type 2 diabetes mellitus without complications: Secondary | ICD-10-CM | POA: Diagnosis not present

## 2015-04-26 DIAGNOSIS — Z79899 Other long term (current) drug therapy: Secondary | ICD-10-CM | POA: Diagnosis not present

## 2015-04-26 DIAGNOSIS — Z8673 Personal history of transient ischemic attack (TIA), and cerebral infarction without residual deficits: Secondary | ICD-10-CM | POA: Diagnosis not present

## 2015-04-26 DIAGNOSIS — I10 Essential (primary) hypertension: Secondary | ICD-10-CM | POA: Insufficient documentation

## 2015-04-26 LAB — CBC WITH DIFFERENTIAL/PLATELET
BASOS ABS: 0.1 10*3/uL (ref 0–0.1)
Basophils Relative: 1 %
EOS ABS: 0.5 10*3/uL (ref 0–0.7)
EOS PCT: 5 %
HCT: 32.6 % — ABNORMAL LOW (ref 40.0–52.0)
Hemoglobin: 10.7 g/dL — ABNORMAL LOW (ref 13.0–18.0)
Lymphocytes Relative: 14 %
Lymphs Abs: 1.4 10*3/uL (ref 1.0–3.6)
MCH: 27.3 pg (ref 26.0–34.0)
MCHC: 32.9 g/dL (ref 32.0–36.0)
MCV: 83.1 fL (ref 80.0–100.0)
Monocytes Absolute: 1.3 10*3/uL — ABNORMAL HIGH (ref 0.2–1.0)
Monocytes Relative: 13 %
Neutro Abs: 7.1 10*3/uL — ABNORMAL HIGH (ref 1.4–6.5)
Neutrophils Relative %: 67 %
PLATELETS: 565 10*3/uL — AB (ref 150–440)
RBC: 3.92 MIL/uL — AB (ref 4.40–5.90)
RDW: 15.7 % — AB (ref 11.5–14.5)
WBC: 10.4 10*3/uL (ref 3.8–10.6)

## 2015-04-26 LAB — IRON AND TIBC
Iron: 29 ug/dL — ABNORMAL LOW (ref 45–182)
SATURATION RATIOS: 6 % — AB (ref 17.9–39.5)
TIBC: 496 ug/dL — AB (ref 250–450)
UIBC: 467 ug/dL

## 2015-04-26 LAB — FERRITIN: FERRITIN: 10 ng/mL — AB (ref 24–336)

## 2015-04-26 MED ORDER — SODIUM CHLORIDE 0.9 % IV SOLN
Freq: Once | INTRAVENOUS | Status: AC
  Administered 2015-04-26: 16:00:00 via INTRAVENOUS
  Filled 2015-04-26: qty 1000

## 2015-04-26 MED ORDER — SODIUM CHLORIDE 0.9 % IV SOLN
510.0000 mg | Freq: Once | INTRAVENOUS | Status: AC
Start: 2015-04-26 — End: 2015-04-26
  Administered 2015-04-26: 510 mg via INTRAVENOUS
  Filled 2015-04-26: qty 17

## 2015-04-26 NOTE — Progress Notes (Signed)
Patient does not offer any problems today.  

## 2015-04-27 LAB — HEMOGLOBINOPATHY EVALUATION
HGB A: 98.1 % — AB (ref 94.0–98.0)
HGB C: 0 %
HGB F QUANT: 0 % (ref 0.0–2.0)
HGB S QUANTITAION: 0 %
Hgb A2 Quant: 1.9 % (ref 0.7–3.1)

## 2015-04-29 DIAGNOSIS — I252 Old myocardial infarction: Secondary | ICD-10-CM | POA: Diagnosis not present

## 2015-04-29 DIAGNOSIS — R0602 Shortness of breath: Secondary | ICD-10-CM | POA: Diagnosis not present

## 2015-04-29 DIAGNOSIS — I4510 Unspecified right bundle-branch block: Secondary | ICD-10-CM | POA: Diagnosis not present

## 2015-05-02 NOTE — Progress Notes (Signed)
[place]  [phone] [phone]  [name] OB: [birth year]  MR#: [id number]  [id number]  Patient Care Team: Isaias Cowman, MD as PCP - General (Cardiology)  CHIEF COMPLAINT:  Chief Complaint  Patient presents with  . Anemia    INTERVAL HISTORY:  Patient returns to clinic today for further evaluation, laboratory work, and consideration of IV Feraheme. He continues to have weakness and fatigue which is chronic and unchanged.  He has no neurologic complaints. He denies any fevers. He denies any chest pain or shortness of breath. He denies any nausea, vomiting, consultation, or diarrhea. He denies any melena or hematochezia. Patient offers no further specific complaints.  REVIEW OF SYSTEMS:   Review of Systems  Constitutional: Positive for malaise/fatigue. Negative for fever.  Respiratory: Negative.   Cardiovascular: Negative.   Gastrointestinal: Negative.  Negative for blood in stool and melena.  Musculoskeletal: Negative.   Skin: Negative.   Neurological: Positive for weakness.    As per HPI. Otherwise, a complete review of systems is negatve.  PAST MEDICAL HISTORY: Past Medical History  Diagnosis Date  . Diabetes mellitus without complication   . Hypertension   . Hearing loss     left ear  . TIA (transient ischemic attack)   . Hypothyroidism   . Sleep apnea   . Stroke   . Arthritis     PAST SURGICAL HISTORY: Past Surgical History  Procedure Laterality Date  . Nose surgery    . Appendectomy    . Eye surgery    . Fracture surgery      FAMILY HISTORY Family History  Problem Relation Age of Onset  . Hypertension Mother   . Lung cancer Father   . Hypertension Brother        ADVANCED DIRECTIVES:    HEALTH MAINTENANCE: Social History  Substance Use Topics  . Smoking status: Former Smoker    Quit date: 10/03/1983  . Smokeless tobacco: Never Used  . Alcohol Use: Yes     Comment: rarely      Colonoscopy:  PAP:  Bone density:  Lipid panel:  Allergies  Allergen Reactions  . Sulfa Antibiotics Itching and Hives  . Amoxicillin Hives  . Ampicillin Hives  . Keflet [Cephalexin] Itching  . Lisinopril Other (See Comments)  . Losartan Other (See Comments)  . Sulfamethoxazole-Trimethoprim Other (See Comments)  . Cephalosporins Hives    Current Outpatient Prescriptions  Medication Sig Dispense Refill  . Cholecalciferol (VITAMIN D3) 1000 UNITS CAPS Take 2,000 Units by mouth.    . feeding supplement, ENSURE ENLIVE, (ENSURE ENLIVE) LIQD Take 237 mLs by mouth 2 (two) times daily between meals. 237 mL 12  . furosemide (LASIX) 20 MG tablet Take 1 tablet (20 mg total) by mouth 2 (two) times daily. 30 tablet 0  . isosorbide mononitrate (IMDUR) 60 MG 24 hr tablet Take 1 tablet (60 mg total) by mouth daily. 30 tablet 0  . levothyroxine (SYNTHROID, LEVOTHROID) 100 MCG tablet Take 100 mcg by mouth every morning.    . magnesium oxide (MAG-OX) 400 MG tablet Take 400 mg by mouth at bedtime.    . metFORMIN (GLUCOPHAGE) 500 MG tablet Take 500 mg by mouth daily with breakfast.    . metFORMIN (GLUCOPHAGE) 500 MG tablet Take 1,500 mg by mouth daily with supper.    . metoprolol tartrate (LOPRESSOR) 25 MG tablet Take 12.5 mg by mouth 2 (two) times daily.     . Multiple Vitamins-Minerals (MULTI FOR HIM 50+ PO)  Take 1 tablet by mouth daily.    . Omega-3 Fatty Acids (FISH OIL) 1000 MG CAPS Take 1 capsule by mouth daily.    . Red Yeast Rice 600 MG TABS Take 2 tablets by mouth daily.     No current facility-administered medications for this visit.    OBJECTIVE: Filed Vitals:   04/26/15 1509  BP: 147/91  Temp: 98.3 F (36.8 C)     Body mass index is 25.04 kg/(m^2).    ECOG FS:1 - Symptomatic but completely ambulatory  General: Well-developed, well-nourished, no acute distress. Eyes: Pink conjunctiva, anicteric sclera. Lungs: Clear to auscultation bilaterally. Heart: Regular rate and rhythm.  No rubs, murmurs, or gallops. Abdomen: Soft, nontender, nondistended. No organomegaly noted, normoactive bowel sounds. Musculoskeletal: No edema, cyanosis, or clubbing. Neuro: Alert, answering all questions appropriately. Cranial nerves grossly intact. Skin: No rashes or petechiae noted. Psych: Normal affect.   LAB RESULTS:  Lab Results  Component Value Date   NA 137 10/09/2014   K 3.3* 10/09/2014   CL 99* 10/09/2014   CO2 29 10/09/2014   GLUCOSE 134* 10/09/2014   BUN 17 10/09/2014   CREATININE 0.73 10/09/2014   CALCIUM 8.5* 10/09/2014   PROT 7.6 10/03/2014   ALBUMIN 4.1 10/03/2014   AST 66* 10/03/2014   ALT 16* 10/03/2014   ALKPHOS 44 10/03/2014   BILITOT 0.2* 10/03/2014   GFRNONAA >60 10/09/2014   GFRAA >60 10/09/2014    Lab Results  Component Value Date   WBC 10.4 04/26/2015   NEUTROABS 7.1* 04/26/2015   HGB 10.7* 04/26/2015   HCT 32.6* 04/26/2015   MCV 83.1 04/26/2015   PLT 565* 04/26/2015   Lab Results  Component Value Date   IRON 29* 04/26/2015   TIBC 496* 04/26/2015   IRONPCTSAT 6* 04/26/2015    Lab Results  Component Value Date   FERRITIN 10* 04/26/2015     STUDIES: No results found.  ASSESSMENT:  Iron deficiency anemia, thrombocytosis.  PLAN:     1. Iron deficiency anemia: Resolved. Patient's hemoglobin and iron stores Have trended down, therefore she will benefit from 510 mg IV Feraheme. Patient was previously evaluated by GI, but no colonoscopy was performed secondary to his MI.  It is unclear when his colonoscopy will be rescheduled. The remainder of his laboratory work was either negative or within normal limits. Return to clinic in 1 week for a second infusion and then in 3 months for repeat laboratory work and further evaluation. 2. Thrombocytosis: Likely reactive as well as secondary to iron deficiency and GI bleed.  3. Recent MI: Treatment per cardiology.  Patient expressed understanding and was in agreement with this plan. He also  understands that He can call clinic at any time with any questions, concerns, or complaints.    Lloyd Huger, MD   05/02/2015 10:26 AM

## 2015-05-10 ENCOUNTER — Inpatient Hospital Stay: Payer: Medicare Other

## 2015-05-10 VITALS — BP 128/71 | HR 67 | Temp 96.8°F | Resp 20

## 2015-05-10 DIAGNOSIS — E039 Hypothyroidism, unspecified: Secondary | ICD-10-CM | POA: Diagnosis not present

## 2015-05-10 DIAGNOSIS — D509 Iron deficiency anemia, unspecified: Secondary | ICD-10-CM | POA: Diagnosis not present

## 2015-05-10 DIAGNOSIS — G4730 Sleep apnea, unspecified: Secondary | ICD-10-CM | POA: Diagnosis not present

## 2015-05-10 DIAGNOSIS — Z79899 Other long term (current) drug therapy: Secondary | ICD-10-CM | POA: Diagnosis not present

## 2015-05-10 DIAGNOSIS — D649 Anemia, unspecified: Secondary | ICD-10-CM

## 2015-05-10 DIAGNOSIS — E119 Type 2 diabetes mellitus without complications: Secondary | ICD-10-CM | POA: Diagnosis not present

## 2015-05-10 DIAGNOSIS — I10 Essential (primary) hypertension: Secondary | ICD-10-CM | POA: Diagnosis not present

## 2015-05-10 MED ORDER — FERUMOXYTOL INJECTION 510 MG/17 ML
510.0000 mg | Freq: Once | INTRAVENOUS | Status: AC
  Administered 2015-05-10: 510 mg via INTRAVENOUS
  Filled 2015-05-10: qty 17

## 2015-05-10 MED ORDER — SODIUM CHLORIDE 0.9 % IV SOLN
Freq: Once | INTRAVENOUS | Status: AC
  Administered 2015-05-10: 15:00:00 via INTRAVENOUS
  Filled 2015-05-10: qty 1000

## 2015-05-11 ENCOUNTER — Ambulatory Visit: Payer: Medicare Other

## 2015-05-11 ENCOUNTER — Other Ambulatory Visit: Payer: Medicare Other

## 2015-05-11 ENCOUNTER — Ambulatory Visit: Payer: Medicare Other | Admitting: Oncology

## 2015-05-14 DIAGNOSIS — I351 Nonrheumatic aortic (valve) insufficiency: Secondary | ICD-10-CM | POA: Diagnosis not present

## 2015-05-14 DIAGNOSIS — I4510 Unspecified right bundle-branch block: Secondary | ICD-10-CM | POA: Diagnosis not present

## 2015-05-14 DIAGNOSIS — I10 Essential (primary) hypertension: Secondary | ICD-10-CM | POA: Diagnosis not present

## 2015-05-14 DIAGNOSIS — I340 Nonrheumatic mitral (valve) insufficiency: Secondary | ICD-10-CM | POA: Diagnosis not present

## 2015-05-14 DIAGNOSIS — G4733 Obstructive sleep apnea (adult) (pediatric): Secondary | ICD-10-CM | POA: Diagnosis not present

## 2015-05-14 DIAGNOSIS — I214 Non-ST elevation (NSTEMI) myocardial infarction: Secondary | ICD-10-CM | POA: Diagnosis not present

## 2015-05-14 DIAGNOSIS — E119 Type 2 diabetes mellitus without complications: Secondary | ICD-10-CM | POA: Diagnosis not present

## 2015-05-14 DIAGNOSIS — E7800 Pure hypercholesterolemia, unspecified: Secondary | ICD-10-CM | POA: Diagnosis not present

## 2015-06-23 ENCOUNTER — Encounter: Payer: Self-pay | Admitting: Family Medicine

## 2015-06-23 ENCOUNTER — Ambulatory Visit (INDEPENDENT_AMBULATORY_CARE_PROVIDER_SITE_OTHER): Payer: Medicare Other | Admitting: Family Medicine

## 2015-06-23 VITALS — BP 134/62 | HR 68 | Temp 98.7°F | Resp 14 | Wt 158.0 lb

## 2015-06-23 DIAGNOSIS — T1592XA Foreign body on external eye, part unspecified, left eye, initial encounter: Secondary | ICD-10-CM | POA: Diagnosis not present

## 2015-06-23 DIAGNOSIS — H2511 Age-related nuclear cataract, right eye: Secondary | ICD-10-CM | POA: Diagnosis not present

## 2015-06-23 DIAGNOSIS — J069 Acute upper respiratory infection, unspecified: Secondary | ICD-10-CM

## 2015-06-23 DIAGNOSIS — E113293 Type 2 diabetes mellitus with mild nonproliferative diabetic retinopathy without macular edema, bilateral: Secondary | ICD-10-CM | POA: Diagnosis not present

## 2015-06-23 DIAGNOSIS — B9789 Other viral agents as the cause of diseases classified elsewhere: Secondary | ICD-10-CM

## 2015-06-23 DIAGNOSIS — H16142 Punctate keratitis, left eye: Secondary | ICD-10-CM | POA: Diagnosis not present

## 2015-06-23 NOTE — Progress Notes (Signed)
BP 134/62 mmHg  Pulse 68  Temp(Src) 98.7 F (37.1 C) (Oral)  Resp 14  Wt 158 lb (71.668 kg)  SpO2 96%   Subjective:    Patient [name], male    DOB: [birth year], 75 y.o.   MRN: [id number]  HPI: [name] is a 75 y.o. male  Chief Complaint  Patient presents with  . Eye Pain    left, patient states felt like something was in it. Onset 3 days ago and today feels like it has cleared up  . URI    onset 1 month on and off.   Sypmtoms include:cough,congestion, runny nose has been using mucinex   Patient is new to me; c/o sensation of foreign body in left eye, URI  Eye does not hurt; can feel just a hair on the left eye; was working in the yard; no power equipment but wind was blowing around on Sunday; bothering him since then; tried flushing it out; tried eye drops; looking for eye wash cup, tried that and it irritated it too; no excessive tearing; positive change in the vision, a little fuzzy at times; no mattering in the left eye, but the right is mattering; no fevers Had cataract extraction in 2010 on the LEFT side  Coughing up phlegm for about a month; no runny nose; having postnasal drip; no fever; does not feel sick; previously from the [place]; no sinus issues; has tried Mucinex; has helped; remote smoker 20 years ago  Diabetes; treated by Dr. Gabriel Carina; pretty well controlled; 6.2 or 6.3  Had flash pulmonary edema by wife's report back in August 2016; had double pneumonia, heart attack  Depression screen [facility] 2/9 06/23/2015  Decreased Interest 0  Down, Depressed, Hopeless 0  PHQ - 2 Score 0   Relevant past medical, surgical, social history reviewed Past Medical History  Diagnosis Date  . Diabetes mellitus without complication ([place])   . Hypertension   . Hearing loss     left ear  . TIA (transient ischemic attack)   . Hypothyroidism   . Sleep apnea   . Stroke ([place])   . Arthritis    Past Surgical History  Procedure Laterality Date  . Nose surgery    .  Appendectomy    . Eye surgery    . Fracture surgery     Social History  Substance Use Topics  . Smoking status: Former Smoker    Quit date: 10/03/1983  . Smokeless tobacco: Never Used  . Alcohol Use: 0.0 oz/week    0 Standard drinks or equivalent per week     Comment: rarely   Interim medical history since last visit reviewed. Allergies and medications reviewed  Review of Systems Per HPI unless specifically indicated above     Objective:    BP 134/62 mmHg  Pulse 68  Temp(Src) 98.7 F (37.1 C) (Oral)  Resp 14  Wt 158 lb (71.668 kg)  SpO2 96%  Wt Readings from Last 3 Encounters:  06/23/15 158 lb (71.668 kg)  04/26/15 160 lb 7.9 oz (72.8 kg)  01/11/15 162 lb 0.6 oz (73.5 kg)    Physical Exam  Constitutional: He appears well-developed and well-nourished. No distress.  Eyes: EOM are normal. Pupils are equal, round, and reactive to light. Right eye exhibits no discharge. No foreign body present in the right eye. Left eye exhibits no discharge. No foreign body present in the left eye. Right conjunctiva is not injected. Right conjunctiva has no hemorrhage. Left conjunctiva is  injected (slightly injection temporally). Left conjunctiva has no hemorrhage. No scleral icterus. Right eye exhibits normal extraocular motion. Left eye exhibits normal extraocular motion.  Cardiovascular: Normal rate and regular rhythm.   Pulmonary/Chest: Effort normal and breath sounds normal.  Lymphadenopathy:    He has no cervical adenopathy.  Skin: Skin is warm and dry. No pallor.  Psychiatric: He has a normal mood and affect.   Diabetic Foot Form - Detailed   Diabetic Foot Exam - detailed  Diabetic Foot exam was performed with the following findings:  Yes 06/23/2015 11:54 AM  Visual Foot Exam completed.:  Yes  Are the toenails long?:  No  Are the toenails thick?:  No  Are the toenails ingrown?:  No  Normal Range of Motion:  Yes    Pulse Foot Exam completed.:  Yes  Right Dorsalis Pedis:  Present  Left Dorsalis Pedis:  Present  Sensory Foot Exam Completed.:  Yes  Swelling:  No  Semmes-Weinstein Monofilament Test  R Site 1-Great Toe:  Pos L Site 1-Great Toe:  Pos  R Site 4:  Pos L Site 4:  Pos  R Site 5:  Pos L Site 5:  Pos          Assessment & Plan:   Problem List Items Addressed This Visit      Other   Foreign body of left eye - Primary    Refer to eye doctor; always wear protection when working in yard      Relevant Orders   Ambulatory referral to Optometry    Other Visit Diagnoses    Viral upper respiratory tract infection with cough        likely viral etiology; however, hx of smoking; if cough not resolving, call and we'll proceed with CXR; see AVS       Follow up plan: No Follow-up on file.  An after-visit summary was printed and given to the patient at [place].  Please see the patient instructions which may contain other information and recommendations beyond what is mentioned above in the assessment and plan.  No orders of the defined types were placed in this encounter.    Orders Placed This Encounter  Procedures  . Ambulatory referral to Optometry

## 2015-06-23 NOTE — Patient Instructions (Signed)
We'll have the eye doctor see you Let me know if the cough is still there after one week and we'll get a chest xray Wear eye protection if working outside in the yard

## 2015-06-24 ENCOUNTER — Ambulatory Visit: Payer: Self-pay | Admitting: Family Medicine

## 2015-06-24 LAB — HM DIABETES EYE EXAM

## 2015-07-04 ENCOUNTER — Encounter: Payer: Self-pay | Admitting: Family Medicine

## 2015-07-04 DIAGNOSIS — E119 Type 2 diabetes mellitus without complications: Secondary | ICD-10-CM | POA: Insufficient documentation

## 2015-07-17 NOTE — Assessment & Plan Note (Signed)
Refer to eye doctor; always wear protection when working in yard

## 2015-07-28 ENCOUNTER — Inpatient Hospital Stay: Payer: Medicare Other

## 2015-07-28 ENCOUNTER — Inpatient Hospital Stay (HOSPITAL_BASED_OUTPATIENT_CLINIC_OR_DEPARTMENT_OTHER): Payer: Medicare Other | Admitting: Oncology

## 2015-07-28 ENCOUNTER — Inpatient Hospital Stay: Payer: Medicare Other | Attending: Oncology

## 2015-07-28 VITALS — BP 152/68 | HR 74 | Temp 97.1°F | Resp 18 | Wt 154.8 lb

## 2015-07-28 VITALS — BP 136/66 | HR 65 | Resp 18

## 2015-07-28 DIAGNOSIS — R5382 Chronic fatigue, unspecified: Secondary | ICD-10-CM | POA: Diagnosis not present

## 2015-07-28 DIAGNOSIS — M1990 Unspecified osteoarthritis, unspecified site: Secondary | ICD-10-CM | POA: Insufficient documentation

## 2015-07-28 DIAGNOSIS — R531 Weakness: Secondary | ICD-10-CM

## 2015-07-28 DIAGNOSIS — E119 Type 2 diabetes mellitus without complications: Secondary | ICD-10-CM

## 2015-07-28 DIAGNOSIS — I252 Old myocardial infarction: Secondary | ICD-10-CM | POA: Diagnosis not present

## 2015-07-28 DIAGNOSIS — R5381 Other malaise: Secondary | ICD-10-CM | POA: Diagnosis not present

## 2015-07-28 DIAGNOSIS — Z79899 Other long term (current) drug therapy: Secondary | ICD-10-CM

## 2015-07-28 DIAGNOSIS — Z87891 Personal history of nicotine dependence: Secondary | ICD-10-CM | POA: Insufficient documentation

## 2015-07-28 DIAGNOSIS — D649 Anemia, unspecified: Secondary | ICD-10-CM

## 2015-07-28 DIAGNOSIS — I10 Essential (primary) hypertension: Secondary | ICD-10-CM | POA: Insufficient documentation

## 2015-07-28 DIAGNOSIS — Z8701 Personal history of pneumonia (recurrent): Secondary | ICD-10-CM | POA: Diagnosis not present

## 2015-07-28 DIAGNOSIS — D7589 Other specified diseases of blood and blood-forming organs: Secondary | ICD-10-CM | POA: Insufficient documentation

## 2015-07-28 DIAGNOSIS — G4730 Sleep apnea, unspecified: Secondary | ICD-10-CM | POA: Diagnosis not present

## 2015-07-28 DIAGNOSIS — Z7984 Long term (current) use of oral hypoglycemic drugs: Secondary | ICD-10-CM | POA: Diagnosis not present

## 2015-07-28 DIAGNOSIS — M5134 Other intervertebral disc degeneration, thoracic region: Secondary | ICD-10-CM | POA: Insufficient documentation

## 2015-07-28 DIAGNOSIS — E039 Hypothyroidism, unspecified: Secondary | ICD-10-CM | POA: Insufficient documentation

## 2015-07-28 DIAGNOSIS — Z801 Family history of malignant neoplasm of trachea, bronchus and lung: Secondary | ICD-10-CM | POA: Diagnosis not present

## 2015-07-28 DIAGNOSIS — D509 Iron deficiency anemia, unspecified: Secondary | ICD-10-CM | POA: Insufficient documentation

## 2015-07-28 DIAGNOSIS — Z8673 Personal history of transient ischemic attack (TIA), and cerebral infarction without residual deficits: Secondary | ICD-10-CM | POA: Insufficient documentation

## 2015-07-28 DIAGNOSIS — R05 Cough: Secondary | ICD-10-CM | POA: Diagnosis not present

## 2015-07-28 LAB — CBC WITH DIFFERENTIAL/PLATELET
Basophils Absolute: 0 10*3/uL (ref 0–0.1)
Basophils Relative: 0 %
EOS ABS: 0.2 10*3/uL (ref 0–0.7)
EOS PCT: 2 %
HCT: 32.1 % — ABNORMAL LOW (ref 40.0–52.0)
HEMOGLOBIN: 10.6 g/dL — AB (ref 13.0–18.0)
LYMPHS ABS: 1.1 10*3/uL (ref 1.0–3.6)
Lymphocytes Relative: 10 %
MCH: 27.4 pg (ref 26.0–34.0)
MCHC: 32.8 g/dL (ref 32.0–36.0)
MCV: 83.3 fL (ref 80.0–100.0)
MONO ABS: 1 10*3/uL (ref 0.2–1.0)
MONOS PCT: 9 %
NEUTROS PCT: 79 %
Neutro Abs: 8.9 10*3/uL — ABNORMAL HIGH (ref 1.4–6.5)
PLATELETS: 582 10*3/uL — AB (ref 150–440)
RBC: 3.86 MIL/uL — ABNORMAL LOW (ref 4.40–5.90)
RDW: 17.7 % — ABNORMAL HIGH (ref 11.5–14.5)
WBC: 11.2 10*3/uL — ABNORMAL HIGH (ref 3.8–10.6)

## 2015-07-28 LAB — IRON AND TIBC
IRON: 24 ug/dL — AB (ref 45–182)
Saturation Ratios: 6 % — ABNORMAL LOW (ref 17.9–39.5)
TIBC: 413 ug/dL (ref 250–450)
UIBC: 389 ug/dL

## 2015-07-28 LAB — FERRITIN: FERRITIN: 30 ng/mL (ref 24–336)

## 2015-07-28 MED ORDER — FERUMOXYTOL INJECTION 510 MG/17 ML
510.0000 mg | Freq: Once | INTRAVENOUS | Status: AC
  Administered 2015-07-28: 510 mg via INTRAVENOUS
  Filled 2015-07-28: qty 17

## 2015-07-28 MED ORDER — SODIUM CHLORIDE 0.9 % IV SOLN
Freq: Once | INTRAVENOUS | Status: AC
  Administered 2015-07-28: 15:00:00 via INTRAVENOUS
  Filled 2015-07-28: qty 1000

## 2015-07-28 NOTE — Progress Notes (Signed)
States is slightly tired from travel these past few days. Had productive cough for a few days and started mucinex which has seemed to help.

## 2015-07-30 ENCOUNTER — Telehealth: Payer: Self-pay | Admitting: *Deleted

## 2015-07-30 NOTE — Telephone Encounter (Signed)
States that he is having a lot of burping and feeling full since starting the iron infusions, asking if iron is the cause. Per L Herring AGNP-C typically not a side effect of iron and to take Gas x and see if that helps. Also voiced concern that his next iron infusion is scheduled for Monday and she wants it changed to the 28 th instead. I had scheduler to change appt per pt request.

## 2015-08-02 ENCOUNTER — Inpatient Hospital Stay: Payer: Medicare Other

## 2015-08-02 DIAGNOSIS — E119 Type 2 diabetes mellitus without complications: Secondary | ICD-10-CM | POA: Diagnosis not present

## 2015-08-05 ENCOUNTER — Telehealth: Payer: Self-pay | Admitting: Family Medicine

## 2015-08-05 ENCOUNTER — Telehealth: Payer: Self-pay | Admitting: *Deleted

## 2015-08-05 ENCOUNTER — Encounter: Payer: Self-pay | Admitting: Family Medicine

## 2015-08-05 ENCOUNTER — Ambulatory Visit
Admission: RE | Admit: 2015-08-05 | Discharge: 2015-08-05 | Disposition: A | Discharge status: Home / Self Care | Payer: Medicare Other | Source: Ambulatory Visit | Attending: Family Medicine | Admitting: Family Medicine

## 2015-08-05 DIAGNOSIS — M5134 Other intervertebral disc degeneration, thoracic region: Secondary | ICD-10-CM

## 2015-08-05 DIAGNOSIS — R05 Cough: Secondary | ICD-10-CM | POA: Insufficient documentation

## 2015-08-05 DIAGNOSIS — I771 Stricture of artery: Secondary | ICD-10-CM

## 2015-08-05 DIAGNOSIS — R059 Cough, unspecified: Secondary | ICD-10-CM | POA: Insufficient documentation

## 2015-08-05 HISTORY — DX: Other intervertebral disc degeneration, thoracic region: M51.34

## 2015-08-05 HISTORY — DX: Stricture of artery: I77.1

## 2015-08-05 MED ORDER — BENZONATATE 100 MG PO CAPS: 100.0000 mg | ORAL_CAPSULE | Freq: Three times a day (TID) | ORAL | Status: DC | PRN

## 2015-08-05 NOTE — Telephone Encounter (Signed)
Called and requests that you "look into him possibly having pernicious anemia" she thinks this may be his problem with his blood

## 2015-08-05 NOTE — Telephone Encounter (Signed)
Was seen 06-23-15 for congestion and was told to call back if it got worse. It did go away but he went to [place] and it came back but worse. He is spitting up phelm and his stomach is upset. I did inform patient that we may have to send them to urgent care because we care completely booked but I will ask you.

## 2015-08-05 NOTE — Telephone Encounter (Signed)
Pt informed

## 2015-08-05 NOTE — Telephone Encounter (Signed)
I reviewed note from 6 weeks ago; let's get CXR and start tessalon perles until he can be seen, first available He is certainly welcome to go to urgent care, but we simply don't have an appt; I am so sorry

## 2015-08-07 NOTE — Progress Notes (Signed)
[facility]  [phone]) [phone] [phone]) [phone]  ID: ITZHAK BARTNIK OB: 12-03-1940  MR#: [id number]  [id number]  Patient Care Team: Arnetha Courser, MD as PCP - General (Family Medicine) D Orion Modest, OD (Optometry)  CHIEF COMPLAINT:  Chief Complaint  Patient presents with  . Anemia    INTERVAL HISTORY:  Patient returns to clinic today for further evaluation, laboratory work, and consideration of IV Feraheme. He continues to have weakness and fatigue which is chronic and unchanged.  He has no neurologic complaints. He denies any fevers. He denies any chest pain or shortness of breath. He denies any nausea, vomiting, consultation, or diarrhea. He denies any melena or hematochezia. Patient offers no further specific complaints.  REVIEW OF SYSTEMS:   Review of Systems  Constitutional: Positive for malaise/fatigue. Negative for fever.  Respiratory: Negative.   Cardiovascular: Negative.   Gastrointestinal: Negative.  Negative for blood in stool and melena.  Musculoskeletal: Negative.   Skin: Negative.   Neurological: Positive for weakness.    As per HPI. Otherwise, a complete review of systems is negatve.  PAST MEDICAL HISTORY: Past Medical History  Diagnosis Date  . Diabetes mellitus without complication ([place])   . Hypertension   . Hearing loss     left ear  . TIA (transient ischemic attack)   . Hypothyroidism   . Sleep apnea   . Stroke ([place])   . Arthritis   . 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 Nose surgery    . Appendectomy    . Eye surgery    . Fracture surgery      FAMILY HISTORY Family History  Problem Relation Age of Onset  . Hypertension Mother   . Lung cancer Father   . Hypertension Brother        ADVANCED DIRECTIVES:    HEALTH MAINTENANCE: Social History    Substance Use Topics  . Smoking status: Former Smoker    Quit date: 10/03/1983  . Smokeless tobacco: Never Used  . Alcohol Use: 0.0 oz/week    0 Standard drinks or equivalent per week     Comment: rarely     Colonoscopy:  PAP:  Bone density:  Lipid panel:  Allergies  Allergen Reactions  . Sulfa Antibiotics Itching and Hives  . Amoxicillin Hives  . Ampicillin Hives  . Keflet [Cephalexin] Itching  . Lisinopril Other (See Comments)  . Losartan Other (See Comments)  . Sulfamethoxazole-Trimethoprim Other (See Comments)  . Cephalosporins Hives    Current Outpatient Prescriptions  Medication Sig Dispense Refill  . Cholecalciferol (VITAMIN D3) 1000 UNITS CAPS Take 2,000 Units by mouth.    . isosorbide mononitrate (IMDUR) 60 MG 24 hr tablet Take 1 tablet (60 mg total) by mouth daily. 30 tablet 0  . levothyroxine (SYNTHROID, LEVOTHROID) 100 MCG tablet Take 100 mcg by mouth every morning.    . magnesium oxide (MAG-OX) 400 MG tablet Take 250 mg by mouth at bedtime.     . metFORMIN (GLUCOPHAGE) 500 MG tablet Take 500 mg by mouth daily with breakfast.    . metFORMIN (GLUCOPHAGE) 500 MG tablet Take 1,500 mg by mouth daily with supper.    . metoprolol tartrate (LOPRESSOR) 25 MG tablet Take 12.5 mg by mouth 2 (two) times daily.     . Multiple Vitamins-Minerals (MULTI FOR HIM 50+ PO) Take  1 tablet by mouth daily.    . Omega-3 Fatty Acids (FISH OIL) 1000 MG CAPS Take 1 capsule by mouth daily.    . Red Yeast Rice 600 MG TABS Take 2 tablets by mouth daily.    . benzonatate (TESSALON PERLES) 100 MG capsule Take 1 capsule (100 mg total) by mouth every 8 (eight) hours as needed for cough. 30 capsule 0   No current facility-administered medications for this visit.    OBJECTIVE: Filed Vitals:   07/28/15 1348  BP: 152/68  Pulse: 74  Temp: 97.1 F (36.2 C)  Resp: 18     Body mass index is 24.15 kg/(m^2).    ECOG FS:1 - Symptomatic but completely ambulatory  General: Well-developed,  well-nourished, no acute distress. Eyes: Pink conjunctiva, anicteric sclera. Lungs: Clear to auscultation bilaterally. Heart: Regular rate and rhythm. No rubs, murmurs, or gallops. Abdomen: Soft, nontender, nondistended. No organomegaly noted, normoactive bowel sounds. Musculoskeletal: No edema, cyanosis, or clubbing. Neuro: Alert, answering all questions appropriately. Cranial nerves grossly intact. Skin: No rashes or petechiae noted. Psych: Normal affect.   LAB RESULTS:  Lab Results  Component Value Date   NA 137 10/09/2014   K 3.3* 10/09/2014   CL 99* 10/09/2014   CO2 29 10/09/2014   GLUCOSE 134* 10/09/2014   BUN 17 10/09/2014   CREATININE 0.73 10/09/2014   CALCIUM 8.5* 10/09/2014   PROT 7.6 10/03/2014   ALBUMIN 4.1 10/03/2014   AST 66* 10/03/2014   ALT 16* 10/03/2014   ALKPHOS 44 10/03/2014   BILITOT 0.2* 10/03/2014   GFRNONAA >60 10/09/2014   GFRAA >60 10/09/2014    Lab Results  Component Value Date   WBC 11.2* 07/28/2015   NEUTROABS 8.9* 07/28/2015   HGB 10.6* 07/28/2015   HCT 32.1* 07/28/2015   MCV 83.3 07/28/2015   PLT 582* 07/28/2015   Lab Results  Component Value Date   IRON 24* 07/28/2015   TIBC 413 07/28/2015   IRONPCTSAT 6* 07/28/2015    Lab Results  Component Value Date   FERRITIN 30 07/28/2015     STUDIES: Dg Chest 2 View  08/05/2015  CLINICAL DATA:  One week of cough; history of pneumonia, bronchitis, diabetes, TIA, former smoker. EXAM: CHEST  2 VIEW COMPARISON:  Chest x-ray of October 08, 2014 FINDINGS: The lungs are adequately inflated and clear. The heart and pulmonary vascularity are normal. There is tortuosity of the ascending thoracic aorta. There is no pleural effusion. There is mild multilevel degenerative disc disease of the thoracic spine. IMPRESSION: There is no active cardiopulmonary disease. Electronically Signed   By: David  Martinique M.D.   On: 08/05/2015 16:19    ASSESSMENT:  Iron deficiency anemia, thrombocytosis.  PLAN:      1. Iron deficiency anemia: Resolved. Patient's hemoglobin and iron stores have trended down, therefore he will benefit from 510 mg IV Feraheme. Patient was previously evaluated by GI, but no colonoscopy was performed secondary to a recent MI.  It is unclear when his colonoscopy will be rescheduled. The remainder of his laboratory work was either negative or within normal limits. Return to clinic in 1 week for a second infusion and then in 2 months for repeat laboratory work and further evaluation. 2. Thrombocytosis: Likely reactive as well as secondary to iron deficiency and GI bleed.  3. Recent MI: Treatment per cardiology.  Patient expressed understanding and was in agreement with this plan. He also understands that He can call clinic at any time with any questions, concerns, or complaints.  Lloyd Huger, MD   08/07/2015 4:19 PM

## 2015-08-11 ENCOUNTER — Ambulatory Visit: Payer: Medicare Other

## 2015-08-12 ENCOUNTER — Ambulatory Visit (INDEPENDENT_AMBULATORY_CARE_PROVIDER_SITE_OTHER): Payer: Medicare Other | Admitting: Family Medicine

## 2015-08-12 ENCOUNTER — Telehealth: Payer: Self-pay

## 2015-08-12 ENCOUNTER — Encounter: Payer: Self-pay | Admitting: Family Medicine

## 2015-08-12 VITALS — BP 132/58 | HR 68 | Temp 98.1°F | Resp 14 | Wt 151.0 lb

## 2015-08-12 DIAGNOSIS — I771 Stricture of artery: Secondary | ICD-10-CM | POA: Diagnosis not present

## 2015-08-12 DIAGNOSIS — M5134 Other intervertebral disc degeneration, thoracic region: Secondary | ICD-10-CM

## 2015-08-12 DIAGNOSIS — N400 Enlarged prostate without lower urinary tract symptoms: Secondary | ICD-10-CM | POA: Diagnosis not present

## 2015-08-12 DIAGNOSIS — D649 Anemia, unspecified: Secondary | ICD-10-CM

## 2015-08-12 DIAGNOSIS — G4730 Sleep apnea, unspecified: Secondary | ICD-10-CM

## 2015-08-12 DIAGNOSIS — I517 Cardiomegaly: Secondary | ICD-10-CM

## 2015-08-12 DIAGNOSIS — R39198 Other difficulties with micturition: Secondary | ICD-10-CM

## 2015-08-12 DIAGNOSIS — I77811 Abdominal aortic ectasia: Secondary | ICD-10-CM | POA: Diagnosis not present

## 2015-08-12 DIAGNOSIS — Z125 Encounter for screening for malignant neoplasm of prostate: Secondary | ICD-10-CM | POA: Diagnosis not present

## 2015-08-12 DIAGNOSIS — I340 Nonrheumatic mitral (valve) insufficiency: Secondary | ICD-10-CM

## 2015-08-12 DIAGNOSIS — R0789 Other chest pain: Secondary | ICD-10-CM

## 2015-08-12 HISTORY — DX: Cardiomegaly: I51.7

## 2015-08-12 HISTORY — DX: Abdominal aortic ectasia: I77.811

## 2015-08-12 HISTORY — DX: Nonrheumatic mitral (valve) insufficiency: I34.0

## 2015-08-12 LAB — LIPID PANEL
CHOL/HDL RATIO: 3.9 ratio (ref <=5.0)
Cholesterol: 159 mg/dL (ref 125–200)
HDL: 41 mg/dL (ref >=40)
LDL CALC: 87 mg/dL (ref <130)
TRIGLYCERIDES: 157 mg/dL — AB (ref <150)
VLDL: 31 mg/dL — ABNORMAL HIGH (ref <30)

## 2015-08-12 NOTE — Assessment & Plan Note (Signed)
Refer to cardiologist. °

## 2015-08-12 NOTE — Progress Notes (Signed)
BP 132/58 mmHg  Pulse 68  Temp(Src) 98.1 F (36.7 C) (Oral)  Resp 14  Wt 151 lb (68.493 kg)  SpO2 94%   Subjective:    Patient [name], male    DOB: 10-10-1940, 75 y.o.   MRN: [id number]  HPI: [name] is a 75 y.o. male  Chief Complaint  Patient presents with  . Follow-up    on chest xray   Patient is here with his wife to follow-up; had the chest xray; no cough; just had congestion, but wife says no cough now  CLINICAL DATA: One week of cough; history of pneumonia, bronchitis, diabetes, TIA, former smoker.  EXAM: CHEST 2 VIEW COMPARISON: Chest x-ray of October 08, 2014  FINDINGS: The lungs are adequately inflated and clear. The heart and pulmonary vascularity are normal. There is tortuosity of the ascending thoracic aorta. There is no pleural effusion. There is mild multilevel degenerative disc disease of the thoracic spine.  IMPRESSION: There is no active cardiopulmonary disease.  Electronically Signed  By: David Martinique M.D.  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, 2010; report reviewed Added to problem list Uncle's daughter had AAA; so that is his cousin; shares maternal grandparents with her; uncle Angus was 64 years old at death, stomach cancer GMA was 74 years old when she died; GPA was younger, around 74 years old when he died; not sure why either died Father died of lung cancer, bad smoker; may have had ruptured bleb  When he first started getting iron transfusions, no problems; now the transfusions are much faster and wondering if his symptoms are all from getting transfusions too fast; has not notified his hematologist; wife got on-line and checked out [facility] website and he has the following: Vomiting, itching, blood pressure changes, blurred vision, chest tightness, confusion, dizziness, on rising, nervousness, pounding in the ears like going to blow up and humming just in the  left ear, sweating, tingling in teh hands, tiredness, weakness, chest discomfort, itching, nausea, abdominal and stomach pain, congestion, increased thirst, runny nose, sneezing, dry skin, itchy eyes, some tearing and discharge from eyes  Taking metoprolol 12.5 mg BID; also imdur; was seeing Dr. Saralyn Pilar; he is not interested in cardiac catheterization; wife discussed a few things and they would like a referral to another cardiologist  Cholesterol panel was done in March 2013; total 182, TG 221, HDL 42, LDL 96 A1c 6.4 most recently, a few weeks ago, June 19th with Dr. Gabriel Carina; reviewed [facility] records plus numbers brought in by wife  Other [facility] labs from a few weeks ago: Urine microalbumin:creatinine normal; TSH 1.646  He takes a while to get urine started; fairly strong stream; no dribbling; rare nocturia   LVEF 55%; had troponin elevation last year, almost died; had sepsis, pneumonia; echo then showed 40-45% April 29, 2015; echo showed LVEF improved to 55% Trivial aortic regurg, moderate MR, trivial TR Slightly enlarged left atrium; asx  INTERPRETATION REGIONALLY IMPAIRED LEFT VENTRICLE WITH PRESERVED SYSTOLIC FUNCTION; ESTIMATED EF = 55 % NORMAL RIGHT VENTRICULAR SYSTOLIC FUNCTION MODERATE MITRAL VALVE INSUFFICIENCY TRACE TRICUSPID AND AORTIC VALVE INSUFFICIENCY NO VALVULAR STENOSIS ------------------------------------------  The wife also brought in a sleep study from 2013; reviewed; she says he needs a new mask, new supplies as noted below: 2 [id number] Basic/CPAP bacteria filter with machine, Resmed59 or Resmed S9 (either a "5" or an "S") 1 - A6 - SP - CHADJ / CPAP chinstrap adjustable 1 -  Resp - [id number] / CPAP tubing - 6' performance tubing  New mask, Dreamware + headgear (may need to fit) Chin strap - Halo   Depression screen PHQ 2/9 06/23/2015  Decreased Interest 0  Down, Depressed, Hopeless 0  PHQ - 2 Score 0   Relevant past medical, surgical, family and  social history reviewed Past Medical History  Diagnosis Date  . Diabetes mellitus without complication ([place])   . Hypertension   . Hearing loss     left ear  . TIA (transient ischemic attack)   . Hypothyroidism   . Sleep apnea   . Stroke ([place])   . Arthritis   . 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 Ectatic abdominal aorta ([place]) 08/12/2015  . Mild left atrial enlargement 08/12/2015    Very mild; LA 4.2 cm; echo March 2017  . Mitral valvular regurgitation 08/12/2015    Past Surgical History  Procedure Laterality Date  . Nose surgery    . Appendectomy    . Eye surgery    . Fracture surgery     Family History  Problem Relation Age of Onset  . Hypertension Mother   . Lung cancer Father   . Hypertension Brother   MD notes: cousin had a AAA  Social History  Substance Use Topics  . Smoking status: Former Smoker    Quit date: 10/03/1983  . Smokeless tobacco: Never Used  . Alcohol Use: 0.0 oz/week    0 Standard drinks or equivalent per week     Comment: rarely   Interim medical history since last visit reviewed. Allergies and medications reviewed  Review of Systems Per HPI unless specifically indicated above     Objective:    BP 132/58 mmHg  Pulse 68  Temp(Src) 98.1 F (36.7 C) (Oral)  Resp 14  Wt 151 lb (68.493 kg)  SpO2 94%  Wt Readings from Last 3 Encounters:  08/12/15 151 lb (68.493 kg)  07/28/15 154 lb 12.2 oz (70.2 kg)  06/23/15 158 lb (71.668 kg)    Physical Exam  Constitutional: He appears well-developed and well-nourished. No distress.  Eyes: EOM are normal. Pupils are equal, round, and reactive to light. Right eye exhibits no discharge. No foreign body present in the right eye. Left eye exhibits no discharge. No foreign body present in the left eye. Right conjunctiva is not injected. Right conjunctiva has no hemorrhage. Left conjunctiva is not injected (slightly injection  temporally). Left conjunctiva has no hemorrhage. No scleral icterus. Right eye exhibits normal extraocular motion. Left eye exhibits normal extraocular motion.  Cardiovascular: Normal rate and regular rhythm.   Pulmonary/Chest: Effort normal and breath sounds normal.  Lymphadenopathy:    He has no cervical adenopathy.  Neurological: He displays no tremor.  No tics  Skin: Skin is warm and dry. No ecchymosis and no rash noted. No pallor.  Psychiatric: He has a normal mood and affect. His mood appears not anxious. He does not exhibit a depressed mood.      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[place])    Refer to cardiologist      Relevant Orders   Lipid panel (Completed)   Ambulatory referral to Cardiology   [place] Retroperitoneal Comp   Mitral valvular regurgitation   Relevant Orders   Lipid panel (Completed)   Ambulatory referral to  Cardiology   Mild left atrial enlargement   Relevant Orders   Lipid panel (Completed)   Ambulatory referral to Cardiology   Ectatic abdominal aorta [facility]) - Primary   Relevant Orders   Lipid panel (Completed)   Ambulatory referral to Cardiology   [place] Retroperitoneal Comp     Musculoskeletal and Integument   DDD (degenerative disc disease), thoracic    Noted on CXR from June 2017; discussed with patient and his wife        Other   Sleep apnea    Reviewed sleep study brought in by wife from 2013; he needs new supplies and she has been gracious and helpful to provide these; Rx signed      Severe anemia    Monitored and treated by hematologist; wife concerned that he is having symptoms from the rate of infusion of his iron treatment; I cannot speak to this, so I asked her to please talk with his hematologist about these concerns and they can see if his symptoms are in fact related to his infusions or if we need to look for something else      Prostate cancer screening      Other Visit Diagnoses    Chest tightness or pressure        Relevant Orders    Lipid panel (Completed)    Ambulatory referral to Cardiology    Abnormal urination        Relevant Orders    Urinalysis w microscopic + reflex cultur (Completed)    BPH (benign prostatic hypertrophy)        Relevant Orders    PSA (Completed)       Follow up plan: Return if symptoms worsen or fail to improve.  An after-visit summary was printed and given to the patient at [place].  Please see the patient instructions which may contain other information and recommendations beyond what is mentioned above in the assessment and plan.  Meds ordered this encounter  Medications  . DISCONTD: metoprolol tartrate (LOPRESSOR) 25 MG tablet    Sig: Take by mouth.  . metFORMIN (GLUCOPHAGE-XR) 500 MG 24 hr tablet    Sig: One by mouth every morning, and three by mouth every evening  . Magnesium Oxide 250 MG TABS    Sig: Take 1 tablet (250 mg total) by mouth daily.    Refill:  0    Orders Placed This Encounter  Procedures  . [place] Retroperitoneal Comp  . Lipid panel  . PSA  . Urinalysis w microscopic + reflex cultur  . Ambulatory referral to Cardiology   Face-to-face time with patient was more than 40 minutes, >50% time spent counseling and coordination of care Significant time spent reviewing records, talking with wife about his hx and care

## 2015-08-12 NOTE — Telephone Encounter (Signed)
Done, thank you!

## 2015-08-12 NOTE — Telephone Encounter (Signed)
Called to schedule u/s aorta, they need new order for u/s retro peritineal complete.  Please reorder

## 2015-08-12 NOTE — Patient Instructions (Addendum)
We'll have you see the cardiologist Do contact you hematologist about your symptoms PRIOR to your infusion Try to limit saturated fats in your diet (bologna, hot dogs, barbeque, cheeseburgers, hamburgers, steak, bacon, sausage, cheese, etc.) and get more fresh fruits, vegetables, and whole grains

## 2015-08-12 NOTE — Assessment & Plan Note (Addendum)
Monitored and treated by hematologist; wife concerned that he is having symptoms from the rate of infusion of his iron treatment; I cannot speak to this, so I asked her to please talk with his hematologist about these concerns and they can see if his symptoms are in fact related to his infusions or if we need to look for something else

## 2015-08-13 ENCOUNTER — Encounter: Payer: Self-pay | Admitting: Family Medicine

## 2015-08-13 LAB — URINALYSIS W MICROSCOPIC + REFLEX CULTURE
BACTERIA UA: NONE SEEN [HPF]
BILIRUBIN URINE: NEGATIVE
CRYSTALS: NONE SEEN [HPF]
Casts: NONE SEEN [LPF]
GLUCOSE, UA: NEGATIVE
HGB URINE DIPSTICK: NEGATIVE
Ketones, ur: NEGATIVE
LEUKOCYTES UA: NEGATIVE
Nitrite: NEGATIVE
PROTEIN: NEGATIVE
RBC / HPF: NONE SEEN RBC/HPF (ref <=2)
SQUAMOUS EPITHELIAL / LPF: NONE SEEN [HPF] (ref <=5)
Specific Gravity, Urine: 1.014 (ref 1.001–1.035)
WBC, UA: NONE SEEN WBC/HPF (ref <=5)
YEAST: NONE SEEN [HPF]
pH: 8 (ref 5.0–8.0)

## 2015-08-13 LAB — PSA: PSA: 0.17 ng/mL (ref <=4.00)

## 2015-08-15 DIAGNOSIS — G4730 Sleep apnea, unspecified: Secondary | ICD-10-CM | POA: Insufficient documentation

## 2015-08-15 NOTE — Assessment & Plan Note (Signed)
Reviewed sleep study brought in by wife from 2013; he needs new supplies and she has been gracious and helpful to provide these; Rx signed

## 2015-08-15 NOTE — Assessment & Plan Note (Signed)
Noted on CXR from June 2017; discussed with patient and his wife

## 2015-08-16 ENCOUNTER — Telehealth: Payer: Self-pay | Admitting: *Deleted

## 2015-08-16 ENCOUNTER — Ambulatory Visit: Payer: Medicare Other | Admitting: Cardiovascular Disease

## 2015-08-16 ENCOUNTER — Encounter: Payer: Self-pay | Admitting: Family Medicine

## 2015-08-16 NOTE — Telephone Encounter (Signed)
Asking if [name] needs to keep appt for Iron infusion Wednesday as he has had side effects from the last 2 infusions he has had. Reports that he has had confusion, blurry vision, itching,vomiting, sweating, tearing of eyes, chest tightness, difficulty swallowing, chills, b/p changes, tingling of hands after each infusion, the second time worse than the first. Please advise

## 2015-08-16 NOTE — Telephone Encounter (Signed)
Per Dr Grayland Ormond cancel iron infusion for 7/6 keep appt for august, Doubt it is the [name] as he has had 5 doses of it. Advised wife of this and she insists that it is the only thing different he had gotten and the infusion rate was faster than usual . I explained to her that she needs to further discuss this at his August appt with both the doctor and infusion nurses

## 2015-08-18 DIAGNOSIS — E119 Type 2 diabetes mellitus without complications: Secondary | ICD-10-CM | POA: Diagnosis not present

## 2015-08-18 DIAGNOSIS — R634 Abnormal weight loss: Secondary | ICD-10-CM | POA: Diagnosis not present

## 2015-08-18 DIAGNOSIS — E039 Hypothyroidism, unspecified: Secondary | ICD-10-CM | POA: Diagnosis not present

## 2015-08-19 ENCOUNTER — Telehealth: Payer: Self-pay | Admitting: Family Medicine

## 2015-08-19 ENCOUNTER — Ambulatory Visit: Payer: Medicare Other

## 2015-08-19 NOTE — Telephone Encounter (Signed)
Pt's called asking if anything has been sent to [facility] for his sleep apnea. Please advise

## 2015-08-23 NOTE — Telephone Encounter (Signed)
Yes, I took care of the Rx last week

## 2015-08-23 NOTE — Telephone Encounter (Signed)
Have this paperwork been taken care of. Wife said she dropped paperwork off last week.

## 2015-08-24 ENCOUNTER — Ambulatory Visit
Admission: RE | Admit: 2015-08-24 | Discharge: 2015-08-24 | Disposition: A | Discharge status: Home / Self Care | Payer: Medicare Other | Source: Ambulatory Visit | Attending: Family Medicine | Admitting: Family Medicine

## 2015-08-24 DIAGNOSIS — I771 Stricture of artery: Secondary | ICD-10-CM | POA: Insufficient documentation

## 2015-08-24 DIAGNOSIS — I77811 Abdominal aortic ectasia: Secondary | ICD-10-CM | POA: Insufficient documentation

## 2015-08-24 NOTE — Telephone Encounter (Signed)
This has been taken care of and I checked with Anderson Malta at [facility] to make sure they received it. Patient gave wrong fax number. Anderson Malta did receive

## 2015-08-25 ENCOUNTER — Encounter: Payer: Self-pay | Admitting: Family Medicine

## 2015-08-26 ENCOUNTER — Encounter: Payer: Self-pay | Admitting: Family Medicine

## 2015-08-26 DIAGNOSIS — E034 Atrophy of thyroid (acquired): Secondary | ICD-10-CM

## 2015-08-26 DIAGNOSIS — G4730 Sleep apnea, unspecified: Secondary | ICD-10-CM

## 2015-08-26 DIAGNOSIS — R110 Nausea: Secondary | ICD-10-CM

## 2015-08-27 ENCOUNTER — Encounter: Payer: Self-pay | Admitting: Family Medicine

## 2015-08-27 DIAGNOSIS — R110 Nausea: Secondary | ICD-10-CM | POA: Insufficient documentation

## 2015-08-27 DIAGNOSIS — E079 Disorder of thyroid, unspecified: Secondary | ICD-10-CM | POA: Insufficient documentation

## 2015-08-29 NOTE — Assessment & Plan Note (Signed)
Missing documentation; refer to sleep specialist

## 2015-08-30 NOTE — Telephone Encounter (Signed)
Roselyn Reef, wife is asking if patient needs to fast for his ultrasound; can you respond to her, because I don't know the answer to that; thank you

## 2015-08-31 ENCOUNTER — Telehealth: Payer: Self-pay | Admitting: Family Medicine

## 2015-08-31 NOTE — Telephone Encounter (Signed)
[facility] CALLED AND SAID THAT YOU ALL HAD PLACED A REFERRAL FOR SLEEP CONSULT. THEY TRIED TO SCHEDULE WITH THE PATIENT AND HE WILL NOT SCHEDULE UNTIL SOMEONE FROM  ( NURSE OR DR LADA) CALLS HIM OR HIS WIFE AS TO WHY HE IS HAVING THIS DONE. PLEASE CALL AND SPEAK WITH HIS WIFE AND SEE IF WE NEED TO MOVE FORWARD. PLEASE CALL THE OFFICE BACK WHEN YOU FIND SOMETHING OUT AT [phone] PER JENNIFER.

## 2015-09-02 NOTE — Telephone Encounter (Signed)
Dr. lada spoke to patient wife

## 2015-09-02 NOTE — Telephone Encounter (Signed)
I am having him see someone to order and interpret his sleep apnea issue because I do not do that; I need him to see a sleep specialist; thank you

## 2015-09-03 ENCOUNTER — Telehealth: Payer: Self-pay | Admitting: Family Medicine

## 2015-09-03 NOTE — Telephone Encounter (Signed)
Patient's wife did in fact bring in the first sleep study from 2013 that shows that he would benefit from a CPAP titration study. That's all I have. I do not have his settings. I need that documentation, or he'll have to see a sleep specialist to go through the titration and get settings again. I'm really sorry, but I can't sign an incomplete form. I promised I'm not trying to be difficult. It would be like a patient saying they needed insulin, but we don't know how many units. That information is necessary. Thank you

## 2015-09-03 NOTE — Telephone Encounter (Signed)
PER JAMIE PATIENTS WIFE CALLED AND PLEASE FILL OUT THE SLEEP STUDY FORM FOR SUPPLIES. WIFE SAYS THAT HE DOES NOT NEED REFERRAL TO PULMONLOGY. IF NAY QUESTIONS PLEASE CONTACT PATIENTS WIFE.

## 2015-09-06 NOTE — Telephone Encounter (Signed)
Called [facility] to get records of last sleep study

## 2015-09-08 ENCOUNTER — Telehealth: Payer: Self-pay | Admitting: Family Medicine

## 2015-09-08 ENCOUNTER — Ambulatory Visit
Admission: RE | Admit: 2015-09-08 | Discharge: 2015-09-08 | Disposition: A | Discharge status: Home / Self Care | Payer: Medicare Other | Source: Ambulatory Visit | Attending: Family Medicine | Admitting: Family Medicine

## 2015-09-08 DIAGNOSIS — R110 Nausea: Secondary | ICD-10-CM | POA: Insufficient documentation

## 2015-09-09 NOTE — Telephone Encounter (Signed)
So the sleep study that I saw in my box was the same one I had already signed that said he still needed a CPAP titration study; I am turning this over to the office manager since we don't seem to be communicating; I need the patient to see a sleep specialist if we don't have his numbers because it sounds like he may need another sleep study/evaluation

## 2015-09-10 ENCOUNTER — Encounter: Payer: Self-pay | Admitting: Family Medicine

## 2015-09-10 ENCOUNTER — Encounter: Payer: Self-pay | Admitting: Cardiovascular Disease

## 2015-09-10 ENCOUNTER — Ambulatory Visit (INDEPENDENT_AMBULATORY_CARE_PROVIDER_SITE_OTHER): Payer: Medicare Other | Admitting: Cardiovascular Disease

## 2015-09-10 ENCOUNTER — Other Ambulatory Visit: Payer: Self-pay | Admitting: Family Medicine

## 2015-09-10 ENCOUNTER — Ambulatory Visit (INDEPENDENT_AMBULATORY_CARE_PROVIDER_SITE_OTHER): Payer: Medicare Other | Admitting: Family Medicine

## 2015-09-10 VITALS — BP 140/62 | HR 78 | Ht 67.0 in | Wt 143.0 lb

## 2015-09-10 DIAGNOSIS — R634 Abnormal weight loss: Secondary | ICD-10-CM

## 2015-09-10 DIAGNOSIS — E034 Atrophy of thyroid (acquired): Secondary | ICD-10-CM | POA: Diagnosis not present

## 2015-09-10 DIAGNOSIS — R160 Hepatomegaly, not elsewhere classified: Secondary | ICD-10-CM

## 2015-09-10 DIAGNOSIS — I2510 Atherosclerotic heart disease of native coronary artery without angina pectoris: Secondary | ICD-10-CM

## 2015-09-10 DIAGNOSIS — G4730 Sleep apnea, unspecified: Secondary | ICD-10-CM | POA: Diagnosis not present

## 2015-09-10 DIAGNOSIS — I77811 Abdominal aortic ectasia: Secondary | ICD-10-CM

## 2015-09-10 DIAGNOSIS — E038 Other specified hypothyroidism: Secondary | ICD-10-CM | POA: Diagnosis not present

## 2015-09-10 DIAGNOSIS — I340 Nonrheumatic mitral (valve) insufficiency: Secondary | ICD-10-CM

## 2015-09-10 DIAGNOSIS — I10 Essential (primary) hypertension: Secondary | ICD-10-CM

## 2015-09-10 DIAGNOSIS — I517 Cardiomegaly: Secondary | ICD-10-CM | POA: Diagnosis not present

## 2015-09-10 NOTE — Telephone Encounter (Signed)
[name] tried to call left voicemail to call back

## 2015-09-10 NOTE — Progress Notes (Signed)
BP (!) 142/58   Pulse 78   Temp 99.1 F (37.3 C) (Oral)   Resp 14   Wt 145 lb (65.8 kg)   SpO2 97%   BMI 22.71 kg/m    Subjective:    Patient [name], male    DOB: 01-May-1940, 75 y.o.   MRN: [id number]  HPI: [name] is a 75 y.o. male  Chief Complaint  Patient presents with  . discuss ultrasound   Patient and his wife are here to go over the [place] report He has been having nausea and belching since the iron infusion Spitting up bile; foods not coming up He does not have good appetite He is losing weight Low grade temps but no fever; will just get a chill if drinking something cold quick No rash, no redness, no swollen joints No travel Stomach doesn't feel right Patient used to be around strong chemicals, ABA PGT incorporated Never took any hormone therapy  We reviewed his recent labs; he still hasn't gotten the CMP done yet, though Never had any prostate cancer, PSA 0.17 Nonfasting TG 157 Diabetes managed by Dr. Gabriel Carina, A1c 6.4 Through [facility], his last TSH was 1.646 on August 02, 2015, so weight loss not from over-replacement  He has just come from the heart doctor; wife talked a little about his hospitalization, risk of cardiac catheterization, her concerns in regards to his heart issues  He also has iron deficiency anemia; we reviewed his previous labs through heme-onc, platelet counts (over 800 several months ago, most recently over 500), Hgb levels up and down  Study Result   CLINICAL DATA:  Two months of intermittent nausea EXAM: US ABDOMEN LIMITED - RIGHT UPPER QUADRANT COMPARISON:  None in PACs FINDINGS: Gallbladder: No gallstones or wall thickening visualized. No sonographic Murphy sign noted by sonographer. Common bile duct: Diameter: 3.5 mm Liver: The hepatic echotexture is mildly increased diffusely. In the right hepatic lobe there is a hypoechoic focus measuring 2.1 x 1.8 x 2 cm. It does not appear hypervascular. There is no  intrahepatic ductal dilation. The surface contour of the liver is normal. IMPRESSION: 1. Abnormal hypoechoic solid-appearing focus in the right hepatic lobe measuring up to 2.1 cm. This may reflect an inflammatory or neoplastic process. A non fat containing hemangioma could produce a similar appearance. Hepatic protocol MRI is recommended. 2. Normal appearance of the gallbladder and common bile duct. Electronically Signed   By: David  Martinique M.D.   On: 09/08/2015 09:57    Sleep apnea also discussed today; he has had CPAP machine for at least five years, if not ten years We reviewed the 2013 study that showed CPAP needed, he needed a tritaton study but no one has been able to deliver that and we don't know his pressure settings Wife says that is not needed, machine is set and just wants the form signed He is using his machine every night He feels good when he wakes up in the morning Mask does not fit well; too small; he wants a new dream ware mask; they will fit the mask No nosebleeds; has humifidier on the machine No headaches Machine improves the quality of life for both patient and his wife Wife requests paper be sent to [facility] in [place]  Relevant past medical, surgical, family and social history reviewed Past Medical History:  Diagnosis Date  . Arthritis   . 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 Diabetes mellitus without complication ([place])   .  Ectatic abdominal aorta ([place]) 08/12/2015  . Hearing loss    left ear  . Hypertension   . Hypothyroidism   . Mild left atrial enlargement 08/12/2015   Very mild; LA 4.2 cm; echo March 2017  . Mitral valvular regurgitation 08/12/2015  . Sleep apnea   . Stroke ([place])   . TIA (transient ischemic attack)   . 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 APPENDECTOMY    . EYE SURGERY    . FRACTURE SURGERY    . NOSE SURGERY     Family History    Problem Relation Age of Onset  . Hypertension Mother   . Lung cancer Father   . Hypertension Brother    Social History  Substance Use Topics  . Smoking status: Former Smoker    Quit date: 10/03/1983  . Smokeless tobacco: Never Used  . Alcohol use 0.0 oz/week     Comment: rarely   Interim medical history since last visit reviewed. Allergies and medications reviewed  Review of Systems Per HPI unless specifically indicated above     Objective:    BP (!) 142/58   Pulse 78   Temp 99.1 F (37.3 C) (Oral)   Resp 14   Wt 145 lb (65.8 kg)   SpO2 97%   BMI 22.71 kg/m   Wt Readings from Last 3 Encounters:  09/10/15 145 lb (65.8 kg)  09/10/15 143 lb (64.9 kg)  08/12/15 151 lb (68.5 kg)    Physical Exam  Constitutional: He appears well-developed and well-nourished. No distress.  Further weight loss noted  Eyes: EOM are normal. Pupils are equal, round, and reactive to light. No foreign body present in the right eye. No foreign body present in the left eye. Right conjunctiva is not injected. Right conjunctiva has no hemorrhage. Left conjunctiva is not injected (slightly injection temporally). Left conjunctiva has no hemorrhage. No scleral icterus. Right eye exhibits normal extraocular motion. Left eye exhibits normal extraocular motion.  Cardiovascular: Normal rate and regular rhythm.   Pulmonary/Chest: Effort normal and breath sounds normal.  Abdominal: Soft. Bowel sounds are normal. He exhibits no distension and no mass. There is tenderness (RUQ). There is no guarding.  Neurological: He is alert. He displays no tremor.  No tics  Skin: Skin is warm and dry. No ecchymosis and no rash noted. No pallor.  Psychiatric: He has a normal mood and affect. His mood appears not anxious. He does not exhibit a depressed mood.    Results for orders placed or performed in visit on 08/12/15  Lipid panel  Result Value Ref Range   Cholesterol 159 125 - 200 mg/dL   Triglycerides 157 (H) <150  mg/dL   HDL 41 >=40 mg/dL   Total CHOL/HDL Ratio 3.9 <=5.0 Ratio   VLDL 31 (H) <30 mg/dL   LDL Cholesterol 87 <130 mg/dL  PSA  Result Value Ref Range   PSA 0.17 <=4.00 ng/mL  Urinalysis w microscopic + reflex cultur  Result Value Ref Range   Color, Urine YELLOW YELLOW   APPearance CLEAR CLEAR   Specific Gravity, Urine 1.014 1.001 - 1.035   pH 8.0 5.0 - 8.0   Glucose, UA NEGATIVE NEGATIVE   Bilirubin Urine NEGATIVE NEGATIVE   Ketones, ur NEGATIVE NEGATIVE   Hgb urine dipstick NEGATIVE NEGATIVE   Protein, ur NEGATIVE NEGATIVE   Nitrite NEGATIVE NEGATIVE   Leukocytes, UA NEGATIVE NEGATIVE   WBC, UA  NONE SEEN <=5 WBC/HPF   RBC / HPF NONE SEEN <=2 RBC/HPF   Squamous Epithelial / LPF NONE SEEN <=5 HPF   Bacteria, UA NONE SEEN NONE SEEN HPF   Crystals NONE SEEN NONE SEEN HPF   Casts NONE SEEN NONE SEEN LPF   Yeast NONE SEEN NONE SEEN HPF      Assessment & Plan:   Problem List Items Addressed This Visit      Endocrine   Hypothyroidism    Managed by endocrinologist; last TSH in June was normal, 1.646, so weight loss not likely related to over-replacement        Other   Sleep apnea    Orders filled out for CPAP supplies; I do not know the pressure settings but will sign the form for supplies      Liver mass, right lobe    Reviewed [place] with patient; note sent to heme-onc specialist; order hepatic protocol MRI; patient will get the CMP that was ordered previously to check liver enzymes; we talked today about how to handle MRI results, and they are fine receiving those by phone      Relevant Orders   MR Liver W Wo Contrast   Abnormal weight loss    Diabetes is controlled, thyroid dose appears correct based on recent TSH; weight loss concerning for malignancy; hepatic protocol MRI ordered; note sent to heme-onc specialist to see if other labs suggested       Other Visit Diagnoses   None.     Follow up plan: No Follow-up on file.  An after-visit summary was printed and  given to the patient at [place].  Please see the patient instructions which may contain other information and recommendations beyond what is mentioned above in the assessment and plan.  Orders Placed This Encounter  Procedures  . MR Liver W Wo Contrast   Face-to-face time with patient was more than 25 minutes, >50% time spent counseling and coordination of care

## 2015-09-10 NOTE — Patient Instructions (Signed)
Medication Instructions: Continue same medications.   Labwork: None.   Procedures/Testing: None.   Follow-Up: 6 months with Dr. Arida.   Any Additional Special Instructions Will Be Listed Below (If Applicable).     If you need a refill on your cardiac medications before your next appointment, please call your pharmacy.   

## 2015-09-10 NOTE — Assessment & Plan Note (Addendum)
Reviewed [place] with patient; note sent to heme-onc specialist; order hepatic protocol MRI; patient will get the CMP that was ordered previously to check liver enzymes; we talked today about how to handle MRI results, and they are fine receiving those by phone

## 2015-09-10 NOTE — Assessment & Plan Note (Signed)
Orders filled out for CPAP supplies; I do not know the pressure settings but will sign the form for supplies

## 2015-09-10 NOTE — Patient Instructions (Signed)
Please do the labs soon I've sent a note to Dr. Grayland Ormond about any additional labs he might suggest We'll get the scan of the liver next week and address the results by phone

## 2015-09-10 NOTE — Assessment & Plan Note (Signed)
Order CEA, CA 19-9, aFP

## 2015-09-10 NOTE — Telephone Encounter (Signed)
Discussed [place] results with patient and his wife

## 2015-09-10 NOTE — Assessment & Plan Note (Signed)
Managed by endocrinologist; last TSH in June was normal, 1.646, so weight loss not likely related to over-replacement

## 2015-09-10 NOTE — Progress Notes (Signed)
Cardiology Office Note   Date:  09/10/2015   ID:  [name], DOB [birth year], MRN [id number]  PCP:  Enid Derry, MD  Cardiologist:   Kathlyn Sacramento, MD   Chief Complaint  Patient presents with  . Other    Ref by Dr. Sanda Jensen for cardiac evaluation; former Dr. Saralyn Pilar patient. Meds reviewed by the patient's med list.       History of Present Illness: [name] is a 75 y.o. male who was referred to establish cardiovascular care. He has multiple chronic medical conditions that include type 2 diabetes, hypothyroidism, previous stroke and mild abdominal aortic ectasia. He was hospitalized in August 2016 at [facility] with urinary tract infection and was found to have severe anemia with a hemoglobin of 5.4. He was transfused but it appears that he developed volume overload and subsequently was found to have elevated troponin which peaked at 47. He had an echocardiogram done which showed an ejection fraction of 40-45% with anterior wall akinesis. His previous ejection fraction in 2012 was normal. The patient was treated medically and stabilized. He subsequently followed up with Dr.Parachos. A stress test was suggested for risk stratification but the patient and his wife declined. He has been doing reasonably well and denies any chest pain or shortness of breath. He is getting regular iron transfusion with improvement in his hemoglobin to 10.6 and most recent testing. He had a repeat echocardiogram in March 2017 which showed an ejection fraction of 55%.  He quit smoking many years ago in 1985. His brother has unspecified heart disease.    Past Medical History:  Diagnosis Date  . Arthritis   . 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 Diabetes mellitus without complication ([place])   . Ectatic abdominal aorta ([place]) 08/12/2015  . Hearing loss    left ear  . Hypertension   . Hypothyroidism   . Mild left atrial enlargement 08/12/2015   Very mild; LA 4.2 cm; echo March 2017  .  Mitral valvular regurgitation 08/12/2015  . Sleep apnea   . Stroke ([place])   . TIA (transient ischemic attack)   . 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 APPENDECTOMY    . EYE SURGERY    . FRACTURE SURGERY    . NOSE SURGERY       Current Outpatient Prescriptions  Medication Sig Dispense Refill  . Biotin (BIOTIN 5000) 5 MG CAPS Take by mouth.    . Cholecalciferol (VITAMIN D3) 1000 UNITS CAPS Take 2,000 Units by mouth daily.    . Coenzyme Q10 (CO Q-10) 200 MG CAPS Take by mouth.    . isosorbide mononitrate (IMDUR) 60 MG 24 hr tablet Take 1 tablet (60 mg total) by mouth daily. 30 tablet 0  . levothyroxine (SYNTHROID, LEVOTHROID) 100 MCG tablet Take 100 mcg by mouth every morning.    . Lutein 40 MG CAPS Take by mouth.    . Magnesium Oxide 250 MG TABS Take 1 tablet (250 mg total) by mouth daily.  0  . metFORMIN (GLUCOPHAGE) 1000 MG tablet Take 2,000 mg by mouth daily.    . metoprolol tartrate (LOPRESSOR) 25 MG tablet Take 12.5 mg by mouth 2 (two) times daily.     . Multiple Vitamins-Minerals (MULTI FOR HIM 50+ PO) Take 1 tablet by mouth daily.    . Omega-3 Fatty Acids (FISH OIL) 1000 MG CAPS Take 1 capsule by  mouth daily.    . Red Yeast Rice 600 MG TABS Take 2 tablets by mouth at bedtime.    Marland Kitchen aspirin EC 81 MG tablet Take 162 mg by mouth 2 (two) times daily.     No current facility-administered medications for this visit.     Allergies:   Sulfa antibiotics; Amoxicillin; Ampicillin; Keflex [cephalexin]; Lisinopril; Losartan; Sulfamethoxazole-trimethoprim; and Cephalosporins    Social History:  The patient  reports that he quit smoking about 31 years ago. He has never used smokeless tobacco. He reports that he drinks alcohol. He reports that he does not use drugs.   Family History:  The patient's family history includes Hypertension in his brother and mother; Lung cancer in his father.    ROS:  Please see the  history of present illness.   Otherwise, review of systems are positive for none.   All other systems are reviewed and negative.    PHYSICAL EXAM: VS:  BP 140/62 (BP Location: Left Arm, Patient Position: Sitting, Cuff Size: Normal)   Pulse 78   Ht 5\' 7"  (1.702 m)   Wt 143 lb (64.9 kg)   BMI 22.40 kg/m  , BMI Body mass index is 22.4 kg/m. GEN: Well nourished, well developed, in no acute distress  HEENT: normal  Neck: no JVD, carotid bruits, or masses Cardiac: RRR; no murmurs, rubs, or gallops,no edema  Respiratory:  clear to auscultation bilaterally, normal work of breathing GI: soft, nontender, nondistended, + BS MS: no deformity or atrophy  Skin: warm and dry, no rash Neuro:  Strength and sensation are intact Psych: euthymic mood, full affect   EKG:  EKG is ordered today. The ekg ordered today demonstrates normal sinus rhythm with right bundle branch block and left anterior fascicular block   Recent Labs: 10/03/2014: ALT 16 10/05/2014: TSH 4.194 10/09/2014: BUN 17; Creatinine, Ser 0.73; Potassium 3.3; Sodium 137 07/28/2015: Hemoglobin 10.6; Platelets 582    Lipid Panel    Component Value Date/Time   CHOL 159 08/12/2015 1235   TRIG 157 (H) 08/12/2015 1235   HDL 41 08/12/2015 1235   CHOLHDL 3.9 08/12/2015 1235   VLDL 31 (H) 08/12/2015 1235   LDLCALC 87 08/12/2015 1235      Wt Readings from Last 3 Encounters:  09/10/15 143 lb (64.9 kg)  08/12/15 151 lb (68.5 kg)  07/28/15 154 lb 12.2 oz (70.2 kg)      Other studies Reviewed: Additional studies/ records that were reviewed today include: Records from hospitalizations and office notes from previous cardiologist. Review of the above records demonstrates: Summarized above. At least 30 minutes was needed to review his chart.   ASSESSMENT AND PLAN:  1.  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 The wife is convinced that the  myocardial infarction was caused by IV fluids and blood transfusion which led to volume overload. I explained to her that with that level of troponin elevation at 47, it's very likely that the patient also had a primary cardiac event and not just supply demand ischemia. Continue medical therapy for now.  2. Hyperlipidemia: Given his diabetes and presumed coronary artery disease, I suggested treatment with a statin. The patient's wife has a very strong opinion against statins and they want to continue with red yeast Rice.    Disposition:   FU with me in 6 months  Signed,  Kathlyn Sacramento, MD  09/10/2015 10:03 AM    [place]

## 2015-09-10 NOTE — Assessment & Plan Note (Signed)
Diabetes is controlled, thyroid dose appears correct based on recent TSH; weight loss concerning for malignancy; hepatic protocol MRI ordered; note sent to heme-onc specialist to see if other labs suggested

## 2015-09-13 ENCOUNTER — Other Ambulatory Visit: Payer: Self-pay

## 2015-09-13 DIAGNOSIS — R160 Hepatomegaly, not elsewhere classified: Secondary | ICD-10-CM | POA: Diagnosis not present

## 2015-09-13 DIAGNOSIS — R110 Nausea: Secondary | ICD-10-CM

## 2015-09-13 LAB — COMPREHENSIVE METABOLIC PANEL
ALK PHOS: 53 U/L (ref 40–115)
ALT: 11 U/L (ref 9–46)
AST: 15 U/L (ref 10–35)
Albumin: 4.4 g/dL (ref 3.6–5.1)
BUN: 14 mg/dL (ref 7–25)
CO2: 29 mmol/L (ref 20–31)
CREATININE: 0.76 mg/dL (ref 0.70–1.18)
Calcium: 9.9 mg/dL (ref 8.6–10.3)
Chloride: 96 mmol/L — ABNORMAL LOW (ref 98–110)
Glucose, Bld: 134 mg/dL — ABNORMAL HIGH (ref 65–99)
Potassium: 5.3 mmol/L (ref 3.5–5.3)
SODIUM: 135 mmol/L (ref 135–146)
TOTAL PROTEIN: 7.2 g/dL (ref 6.1–8.1)
Total Bilirubin: 0.3 mg/dL (ref 0.2–1.2)

## 2015-09-15 ENCOUNTER — Encounter: Payer: Self-pay | Admitting: Family Medicine

## 2015-09-16 ENCOUNTER — Encounter: Payer: Self-pay | Admitting: Family Medicine

## 2015-09-17 ENCOUNTER — Ambulatory Visit (INDEPENDENT_AMBULATORY_CARE_PROVIDER_SITE_OTHER): Payer: Medicare Other | Admitting: Family Medicine

## 2015-09-17 ENCOUNTER — Encounter: Payer: Self-pay | Admitting: Family Medicine

## 2015-09-17 ENCOUNTER — Emergency Department
Admission: EM | Admit: 2015-09-17 | Discharge: 2015-09-17 | Disposition: A | Discharge status: Home / Self Care | Payer: Medicare Other | Attending: Emergency Medicine | Admitting: Emergency Medicine

## 2015-09-17 ENCOUNTER — Emergency Department: Payer: Medicare Other

## 2015-09-17 VITALS — BP 142/83 | HR 75 | Temp 98.4°F | Resp 14 | Wt 141.0 lb

## 2015-09-17 DIAGNOSIS — E119 Type 2 diabetes mellitus without complications: Secondary | ICD-10-CM | POA: Diagnosis not present

## 2015-09-17 DIAGNOSIS — R079 Chest pain, unspecified: Secondary | ICD-10-CM | POA: Diagnosis not present

## 2015-09-17 DIAGNOSIS — Z7984 Long term (current) use of oral hypoglycemic drugs: Secondary | ICD-10-CM | POA: Insufficient documentation

## 2015-09-17 DIAGNOSIS — R160 Hepatomegaly, not elsewhere classified: Secondary | ICD-10-CM | POA: Diagnosis not present

## 2015-09-17 DIAGNOSIS — I10 Essential (primary) hypertension: Secondary | ICD-10-CM | POA: Diagnosis not present

## 2015-09-17 DIAGNOSIS — Z8673 Personal history of transient ischemic attack (TIA), and cerebral infarction without residual deficits: Secondary | ICD-10-CM | POA: Diagnosis not present

## 2015-09-17 DIAGNOSIS — R9431 Abnormal electrocardiogram [ECG] [EKG]: Secondary | ICD-10-CM

## 2015-09-17 DIAGNOSIS — L723 Sebaceous cyst: Secondary | ICD-10-CM

## 2015-09-17 DIAGNOSIS — E039 Hypothyroidism, unspecified: Secondary | ICD-10-CM | POA: Diagnosis not present

## 2015-09-17 DIAGNOSIS — I2510 Atherosclerotic heart disease of native coronary artery without angina pectoris: Secondary | ICD-10-CM | POA: Diagnosis not present

## 2015-09-17 DIAGNOSIS — Z87891 Personal history of nicotine dependence: Secondary | ICD-10-CM | POA: Insufficient documentation

## 2015-09-17 DIAGNOSIS — Z7982 Long term (current) use of aspirin: Secondary | ICD-10-CM | POA: Insufficient documentation

## 2015-09-17 DIAGNOSIS — R072 Precordial pain: Secondary | ICD-10-CM | POA: Insufficient documentation

## 2015-09-17 DIAGNOSIS — Z79899 Other long term (current) drug therapy: Secondary | ICD-10-CM | POA: Insufficient documentation

## 2015-09-17 LAB — BASIC METABOLIC PANEL
Anion gap: 10 (ref 5–15)
BUN: 15 mg/dL (ref 6–20)
CALCIUM: 9.4 mg/dL (ref 8.9–10.3)
CHLORIDE: 96 mmol/L — AB (ref 101–111)
CO2: 27 mmol/L (ref 22–32)
CREATININE: 0.67 mg/dL (ref 0.61–1.24)
Glucose, Bld: 96 mg/dL (ref 65–99)
Potassium: 4.7 mmol/L (ref 3.5–5.1)
SODIUM: 133 mmol/L — AB (ref 135–145)

## 2015-09-17 LAB — CBC
HCT: 32.6 % — ABNORMAL LOW (ref 40.0–52.0)
Hemoglobin: 10.8 g/dL — ABNORMAL LOW (ref 13.0–18.0)
MCH: 27.8 pg (ref 26.0–34.0)
MCHC: 33.3 g/dL (ref 32.0–36.0)
MCV: 83.6 fL (ref 80.0–100.0)
PLATELETS: 578 10*3/uL — AB (ref 150–440)
RBC: 3.9 MIL/uL — AB (ref 4.40–5.90)
RDW: 16.7 % — AB (ref 11.5–14.5)
WBC: 11.9 10*3/uL — AB (ref 3.8–10.6)

## 2015-09-17 LAB — TROPONIN I

## 2015-09-17 LAB — GLUCOSE, CAPILLARY: GLUCOSE-CAPILLARY: 103 mg/dL — AB (ref 65–99)

## 2015-09-17 NOTE — Progress Notes (Signed)
BP (!) 142/83   Pulse 75   Temp 98.4 F (36.9 C) (Oral)   Resp 14   Wt 141 lb (64 kg)   SpO2 94%   BMI 22.08 kg/m    Subjective:    Patient [name], male    DOB: 1940/04/17, 75 y.o.   MRN: [id number]  HPI: [name] is a 75 y.o. male  Chief Complaint  Patient presents with  . Follow-up   Patient is here with his wife See MyChart note; he is continuing to lose weight and wife is worried He has a black mole on his back that she wants checked out He has been spitting up salty material, not draining from head/sinuses; this feels like it is in his upper chest; they brought a clear glass bottle which appears to be filled with saliva and few particulate pieces of matter; no blood Upon further questioning after the "mole" was examined and lanced, patient says that he developed chest pain about 45 minutes before coming in today; this is the first that his wife had heard about this; he describes the pain as running across the front of his chest; he has a tender area on the right side of sternum; no SHOB, no nausea with this new onset chest pain; he has a hx of congestive heart failure and TIAs but no prior MI; he has seen cardiology, who wanted to do further evaluation, but patient and his wife have declined; patient is taking imdur 120 mg daily and aspirin daily, as well as a beta-blocker; he does not take a statin, but does take red yeast rice; he has diabetes, which is managed by an endocrinologist Wife says he finally got the CPAP supplies  Relevant past medical, surgical, family and social history reviewed Past Medical History:  Diagnosis Date  . Arthritis   . 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 Diabetes mellitus without complication ([place])   . Ectatic abdominal aorta ([place]) 08/12/2015  . Hearing loss    left ear  . Hypertension   . Hypothyroidism   . Mild left atrial enlargement 08/12/2015   Very mild; LA 4.2 cm; echo March 2017  . Mitral valvular  regurgitation 08/12/2015  . Sleep apnea   . Stroke ([place])   . TIA (transient ischemic attack)   . 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 APPENDECTOMY    . EYE SURGERY    . FRACTURE SURGERY    . NOSE SURGERY     Family History  Problem Relation Age of Onset  . Hypertension Mother   . Lung cancer Father   . Hypertension Brother    Social History  Substance Use Topics  . Smoking status: Former Smoker    Packs/day: 1.00    Types: Cigarettes    Quit date: 10/03/1983  . Smokeless tobacco: Never Used  . Alcohol use 0.0 oz/week     Comment: rarely   Interim medical history since last visit reviewed. Allergies and medications reviewed  Review of Systems Per HPI unless specifically indicated above     Objective:    BP (!) 142/83   Pulse 75   Temp 98.4 F (36.9 C) (Oral)   Resp 14   Wt 141 lb (64 kg)   SpO2 94%   BMI 22.08 kg/m   Wt Readings from Last 3 Encounters:  09/17/15 141 lb (  64 kg)  09/10/15 145 lb (65.8 kg)  09/10/15 143 lb (64.9 kg)    Physical Exam  Constitutional: He appears well-developed and well-nourished. No distress.  Further weight loss noted  Eyes: EOM are normal. Pupils are equal, round, and reactive to light. No foreign body present in the right eye. No foreign body present in the left eye. Right conjunctiva is not injected. Right conjunctiva has no hemorrhage. Left conjunctiva is not injected (slightly injection temporally). Left conjunctiva has no hemorrhage. No scleral icterus. Right eye exhibits normal extraocular motion. Left eye exhibits normal extraocular motion.  Cardiovascular: Normal rate and regular rhythm.   Pulmonary/Chest: Effort normal and breath sounds normal. He has no wheezes. He has no rales.  Abdominal: Soft. He exhibits no distension and no mass. There is tenderness (RUQ) in the right upper quadrant. There is no guarding.  Neurological: He  is alert. He displays no tremor.  No tics  Skin: Skin is warm and dry. Lesion (upper left side of back, there is a sebaceous cyst with very dark coloration internally; open central pore) noted. No ecchymosis and no rash noted. No pallor.  Other lesions on the back appeared to be uniformly brown keratotic lesions consistent with seborrheic keratoses  Psychiatric: He has a normal mood and affect. His mood appears not anxious. He does not exhibit a depressed mood.   Results for orders placed or performed in visit on 09/13/15  Comprehensive Metabolic Panel (CMET)  Result Value Ref Range   Sodium 135 135 - 146 mmol/L   Potassium 5.3 3.5 - 5.3 mmol/L   Chloride 96 (L) 98 - 110 mmol/L   CO2 29 20 - 31 mmol/L   Glucose, Bld 134 (H) 65 - 99 mg/dL   BUN 14 7 - 25 mg/dL   Creat 0.76 0.70 - 1.18 mg/dL   Total Bilirubin 0.3 0.2 - 1.2 mg/dL   Alkaline Phosphatase 53 40 - 115 U/L   AST 15 10 - 35 U/L   ALT 11 9 - 46 U/L   Total Protein 7.2 6.1 - 8.1 g/dL   Albumin 4.4 3.6 - 5.1 g/dL   Calcium 9.9 8.6 - 10.3 mg/dL      Assessment & Plan:   Problem List Items Addressed This Visit      Musculoskeletal and Integument   Sebaceous cyst    Upper left side back; lesion appears to be sebaceous cyst; cleaned with alcohol, ethyl chloride used to cool skin temp, then very small incision made with #11 blade; entire capsule and cyst removed in toto, with dark coloration (oxidized sebaceous material) removed along with it; no remaining dark pigment left at base; covered with antibiotic ointment and band-aid        Other   Liver mass, right lobe    Awaiting MRI of liver; I have communicated earlier with patient's hematologist-oncologist; unfortunately, insurance would not cover for me to order alpha-fetoprotein, CEA, or CA19-9 as suggested by him; will have to wait on MRI; patient continues to lose weight despite normal TSH and controlled diabetes      Chest pain at rest    Patient reported chest pain  starting 45 minutes prior to visit; 5 or 6 out of 10; by the time EMS arrived, pain was down to a 1 out of 10; he just saw cardiologist last week; EKG showed new findings suggestive of ischemia in the high lateral leads (I and aVL); 911 was called, EMS transported patient to the ER  Abnormal EKG    EKG done today and compared to EKG done just last week at cardiologist's office; he has new inverted T waves in the high lateral leads; may be suggestive of ischemia; 911 was called and patient was given 4 tablets of 81 mg aspirin, placed on supplemental oxygen (we did not have nasal cannula, so peds non-rebreather used and placed near his mouth/nose); EMS arrived and transported patient to the ER in stable condition       Other Visit Diagnoses   None.     Follow up plan: No Follow-up on file.  An after-visit summary was printed and given to the patient at [facility].  Please see the patient instructions which may contain other information and recommendations beyond what is mentioned above in the assessment and plan.

## 2015-09-17 NOTE — ED Notes (Signed)
Took patient graham crackers and water.

## 2015-09-17 NOTE — Discharge Instructions (Signed)
As we explained to you your chest pain is concerning for heart disease. You have refuse admission and a stress test at this time. If you change your mind or if your pain returns please return to the emergency department as we are happy to see you again. Otherwise make sure to follow up with your doctor on Monday for further evaluation.

## 2015-09-17 NOTE — Consult Note (Signed)
[place] [facility] [place] NAME: [name]    MR#:  [id number]  DATE OF BIRTH:  09-25-1940  DATE OF ADMISSION:  09/17/2015  PRIMARY CARE PHYSICIAN: Enid Derry, MD   CONSULT REQUESTING/REFERRING PHYSICIAN: Dr. Alfred Levins  REASON FOR CONSULT: Chest pain  CHIEF COMPLAINT:   Chief Complaint  Patient presents with  . Chest Pain    HISTORY OF PRESENT ILLNESS:  [name]  is a 75 y.o. male with a known history of Diabetes, TIA, N STEMI during an episode of sepsis presents to the emergency room due to chest pain. This lasted 45 minutes with no aggravating or relieving factors and resolved on its own. Presently patient is chest pain-free. EKG shows right bundle branch block which is old. Troponin is normal. Patient has been following up with [facility] clinic cardiology Dr. Tomasita Crumble shows recommended a stress test in the past which was refused by patient and his wife. They had a second opinion to Dr. Fletcher Anon last week he recommended further investigation but not agreed by the patient and wife. The refusing any further investigations at this point.  Patient and wife feel like patient cannot walk on a treadmill and should not get a stress test as he is weak.  PAST MEDICAL HISTORY:   Past Medical History:  Diagnosis Date  . Arthritis   . 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 Diabetes mellitus without complication ([place])   . Ectatic abdominal aorta ([place]) 08/12/2015  . Hearing loss    left ear  . Hypertension   . Hypothyroidism   . Mild left atrial enlargement 08/12/2015   Very mild; LA 4.2 cm; echo March 2017  . Mitral valvular regurgitation 08/12/2015  . Sleep apnea   . Stroke ([place])   . TIA (transient ischemic attack)   . 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 APPENDECTOMY    . EYE SURGERY    . FRACTURE SURGERY    .  NOSE SURGERY      SOCIAL HISTORY:   Social History  Substance Use Topics  . Smoking status: Former Smoker    Packs/day: 1.00    Types: Cigarettes    Quit date: 10/03/1983  . Smokeless tobacco: Never Used  . Alcohol use 0.0 oz/week     Comment: rarely    FAMILY HISTORY:   Family History  Problem Relation Age of Onset  . Hypertension Mother   . Lung cancer Father   . Hypertension Brother     DRUG ALLERGIES:   Allergies  Allergen Reactions  . Sulfa Antibiotics Itching and Hives  . Amoxicillin Hives  . Ampicillin Hives  . Keflex [Cephalexin] Itching  . Lisinopril Other (See Comments)  . Losartan Other (See Comments)  . Sulfamethoxazole-Trimethoprim Other (See Comments)  . Cephalosporins Hives    REVIEW OF SYSTEMS:   ROS  CONSTITUTIONAL: No fever, fatigue or weakness.  EYES: No blurred or double vision.  EARS, NOSE, AND THROAT: No tinnitus or ear pain.  RESPIRATORY: No cough, shortness of breath, wheezing or hemoptysis.  CARDIOVASCULAR: No orthopnea, edema. Positive for chest pain GASTROINTESTINAL: No nausea, vomiting, diarrhea or abdominal pain.  GENITOURINARY: No dysuria, hematuria.  ENDOCRINE: No polyuria, nocturia,  HEMATOLOGY: No anemia, easy bruising or bleeding SKIN: No rash or lesion. MUSCULOSKELETAL: No joint pain or arthritis.   NEUROLOGIC: No tingling, numbness, weakness.  PSYCHIATRY: No anxiety or depression.   MEDICATIONS AT HOME:   Prior to Admission medications   Medication Sig Start Date End Date Taking? Authorizing Provider  aspirin EC 81 MG tablet Take 162 mg by mouth 2 (two) times daily.    Historical Provider, MD  Biotin (BIOTIN 5000) 5 MG CAPS Take by mouth.    Historical Provider, MD  Cholecalciferol (VITAMIN D3) 1000 UNITS CAPS Take 2,000 Units by mouth daily.    Historical Provider, MD  Coenzyme Q10 (CO Q-10) 200 MG CAPS Take by mouth.    Historical Provider, MD  isosorbide mononitrate (IMDUR) 60 MG 24 hr tablet Take 1 tablet (60 mg  total) by mouth daily. 10/09/14   Max Sane, MD  levothyroxine (SYNTHROID, LEVOTHROID) 100 MCG tablet Take 100 mcg by mouth every morning. 07/29/14   Historical Provider, MD  Lutein 40 MG CAPS Take by mouth.    Historical Provider, MD  Magnesium Oxide 250 MG TABS Take 1 tablet (250 mg total) by mouth daily. 08/12/15   Arnetha Courser, MD  metFORMIN (GLUCOPHAGE) 1000 MG tablet Take 2,000 mg by mouth daily.    Historical Provider, MD  metoprolol tartrate (LOPRESSOR) 25 MG tablet Take 12.5 mg by mouth 2 (two) times daily.  07/24/11   Historical Provider, MD  Multiple Vitamins-Minerals (MULTI FOR HIM 50+ PO) Take 1 tablet by mouth daily.    Historical Provider, MD  Omega-3 Fatty Acids (FISH OIL) 1000 MG CAPS Take 1 capsule by mouth daily. 12/30/09   Historical Provider, MD  Red Yeast Rice 600 MG TABS Take 2 tablets by mouth at bedtime. 12/30/09   Historical Provider, MD      VITAL SIGNS:  Blood pressure (!) 103/91, pulse 73, resp. rate (!) 22, SpO2 95 %.  PHYSICAL EXAMINATION:  GENERAL:  76 y.o.-year-old patient lying in the bed with no acute distress.  EYES: Pupils equal, round, reactive to light and accommodation. No scleral icterus. Extraocular muscles intact.  HEENT: Head atraumatic, normocephalic. Oropharynx and nasopharynx clear.  NECK:  Supple, no jugular venous distention. No thyroid enlargement, no tenderness.  LUNGS: Normal breath sounds bilaterally, no wheezing, rales,rhonchi or crepitation. No use of accessory muscles of respiration.  CARDIOVASCULAR: S1, S2 normal. No murmurs, rubs, or gallops.  ABDOMEN: Soft, nontender, nondistended. Bowel sounds present. No organomegaly or mass.  EXTREMITIES: No pedal edema, cyanosis, or clubbing.  NEUROLOGIC: Cranial nerves II through XII are intact. Muscle strength 5/5 in all extremities. Sensation intact. Gait not checked.  PSYCHIATRIC: The patient is alert and oriented x 3.  SKIN: No obvious rash, lesion, or ulcer.   LABORATORY PANEL:    CBC  Recent Labs Lab 09/17/15 1716  WBC 11.9*  HGB 10.8*  HCT 32.6*  PLT 578*   ------------------------------------------------------------------------------------------------------------------  Chemistries   Recent Labs Lab 09/13/15 1441 09/17/15 1716  NA 135 133*  K 5.3 4.7  CL 96* 96*  CO2 29 27  GLUCOSE 134* 96  BUN 14 15  CREATININE 0.76 0.67  CALCIUM 9.9 9.4  AST 15  --   ALT 11  --   ALKPHOS 53  --   BILITOT 0.3  --    ------------------------------------------------------------------------------------------------------------------  Cardiac Enzymes  Recent Labs Lab 09/17/15 1716  TROPONINI <0.03   ------------------------------------------------------------------------------------------------------------------  RADIOLOGY:  No results found.  EKG:   Orders placed or performed during the hospital encounter of 09/17/15  . EKG 12-Lead  . EKG 12-Lead  . EKG 12-Lead  . EKG 12-Lead  . ED EKG within  10 minutes  . ED EKG within 10 minutes  . EKG 12-Lead  . EKG 12-Lead    IMPRESSION AND PLAN:   * Atypical chest pain With patient's risk factors of an STEMI in the past and echocardiogram showing abnormalities. Patient is at high risk and will need further investigation. He will need a stress test. This was recommended earlier by cardiology Dr. Irwin Brakeman and Dr. Fletcher Anon. But patient and wife are convinced he does not need a  Stress test. I have advised multiple times but they are not agreeing. Discussed with ER doctor Dr.Veronese. Patient will get a repeat troponin and then discharge home if stable. If the repeat troponin is elevated we'll need 3 discussed with patient. But I don't think they will agree for admission.    All the records are reviewed and case discussed with Consulting provider. Management plans discussed with the patient, family and they are in agreement.  TOTAL TIME TAKING CARE OF THIS PATIENT: 40 minutes.    Hillary Bow R M.D  on 09/17/2015 at 6:45 PM  Between 7am to 6pm - Pager - [phone]  After 6pm go to [email or web address] - password [email or web address] [facility]  Office  [phone]  CC: Primary care Physician: Enid Derry, MD     Note: This dictation was prepared with Dragon dictation along with smaller phrase technology. Any transcriptional errors that result from this process are unintentional.

## 2015-09-17 NOTE — ED Provider Notes (Signed)
[facility] Emergency Department Provider Note  ____________________________________________  Time seen: Approximately 5:50 PM  I have reviewed the triage vital signs and the nursing notes.   HISTORY  Chief Complaint Chest Pain   HPI [name] is a 75 y.o. male history of diabetes, hypertension, hypothyroidism, CVA who presents for evaluation of chest pain. Patient reports that he was sitting down in his house and he developed substernal chest pain. He reports that the pain was squeezing, severe, radiating to his back, into his left arm, he denies lightheadedness, nausea, vomiting, shortness of breath. He reports his never had pain like this before. He reports that the pain was present for a few hours and resolved and no intervention. He went to his primary care doctor today for a regular checkup and told her about the chest pain. She did an EKG and gave him a full dose of aspirin and sent him here for evaluation. A year ago patient was hospitalized with sepsis and had an N STEMI and that he was thought to be due to demand ischemia. He had an echocardiogram done in August 2016 that showed akinesis of the anterior myocardium and anterior septal wall. Patient has never underwent a left heart catheter. He reports that he underwent a stress test many years ago but was unable to finish due to pain. Patient is currently pain-free. Is a history of former smoking. Has a history of cardiac disease in his mother.  Past Medical History:  Diagnosis Date  . Arthritis   . 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 Diabetes mellitus without complication ([place])   . 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 Hearing loss    left ear  . Hypertension   . Hypothyroidism   . Mild left atrial enlargement 08/12/2015   Very mild; LA 4.2 cm; echo March 2017  . Mitral valvular regurgitation 08/12/2015  . Sleep apnea   . Stroke ([place])   . TIA (transient ischemic attack)     . 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 Liver mass, right lobe 09/10/2015  . Abnormal weight loss 09/10/2015  . Nausea 08/27/2015  . Hypothyroidism 08/27/2015  . Sleep apnea 08/15/2015  . 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 Mild left atrial enlargement 08/12/2015  . Mitral valvular regurgitation 08/12/2015  . Prostate cancer screening 08/12/2015  . Cough 08/05/2015  . 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 Foreign body of left eye 06/23/2015  . Severe anemia 10/03/2014  . GI bleed 10/03/2014  . DM (diabetes mellitus) ([place]) 10/03/2014  . HTN (hypertension) 10/03/2014  . Altered mental status   . Anemia requiring transfusions     Past Surgical History:  Procedure Laterality Date  . APPENDECTOMY    . EYE SURGERY    . FRACTURE SURGERY    . NOSE SURGERY      Prior to Admission medications   Medication Sig Start Date End Date Taking? Authorizing Provider  aspirin EC 81 MG tablet Take 162 mg by mouth 2 (two) times daily.    Historical Provider, MD  Biotin (BIOTIN 5000) 5 MG CAPS Take by mouth.    Historical Provider, MD  Cholecalciferol (VITAMIN D3) 1000 UNITS CAPS Take 2,000 Units by mouth daily.    Historical Provider, MD  Coenzyme  Q10 (CO Q-10) 200 MG CAPS Take by mouth.    Historical Provider, MD  isosorbide mononitrate (IMDUR) 60 MG 24 hr tablet Take 1 tablet (60 mg total) by mouth daily. 10/09/14   Max Sane, MD  levothyroxine (SYNTHROID, LEVOTHROID) 100 MCG tablet Take 100 mcg by mouth every morning. 07/29/14   Historical Provider, MD  Lutein 40 MG CAPS Take by mouth.    Historical Provider, MD  Magnesium Oxide 250 MG TABS Take 1 tablet (250 mg total) by mouth daily. 08/12/15   Arnetha Courser, MD  metFORMIN (GLUCOPHAGE) 1000 MG tablet Take 2,000 mg by mouth daily.     Historical Provider, MD  metoprolol tartrate (LOPRESSOR) 25 MG tablet Take 12.5 mg by mouth 2 (two) times daily.  07/24/11   Historical Provider, MD  Multiple Vitamins-Minerals (MULTI FOR HIM 50+ PO) Take 1 tablet by mouth daily.    Historical Provider, MD  Omega-3 Fatty Acids (FISH OIL) 1000 MG CAPS Take 1 capsule by mouth daily. 12/30/09   Historical Provider, MD  Red Yeast Rice 600 MG TABS Take 2 tablets by mouth at bedtime. 12/30/09   Historical Provider, MD    Allergies Sulfa antibiotics; Amoxicillin; Ampicillin; Keflex [cephalexin]; Lisinopril; Losartan; Sulfamethoxazole-trimethoprim; and Cephalosporins  Family History  Problem Relation Age of Onset  . Hypertension Mother   . Lung cancer Father   . Hypertension Brother     Social History Social History  Substance Use Topics  . Smoking status: Former Smoker    Packs/day: 1.00    Types: Cigarettes    Quit date: 10/03/1983  . Smokeless tobacco: Never Used  . Alcohol use 0.0 oz/week     Comment: rarely    Review of Systems  Constitutional: Negative for fever. Eyes: Negative for visual changes. ENT: Negative for sore throat. Cardiovascular: + chest pain. Respiratory: Negative for shortness of breath. Gastrointestinal: Negative for abdominal pain, vomiting or diarrhea. Genitourinary: Negative for dysuria. Musculoskeletal: Negative for back pain. Skin: Negative for rash. Neurological: Negative for headaches, weakness or numbness.  ____________________________________________   PHYSICAL EXAM:  VITAL SIGNS: ED Triage Vitals [09/17/15 1707]  Enc Vitals Group     BP (!) 141/62     Pulse Rate 71     Resp 16     Temp      Temp src      SpO2 94 %     Weight      Height      Head Circumference      Peak Flow      Pain Score      Pain Loc      Pain Edu?      Excl. in [place]?     Constitutional: Alert and oriented. Well appearing and in no apparent distress. HEENT:      Head: Normocephalic and atraumatic.          Eyes: Conjunctivae are normal. Sclera is non-icteric. EOMI. PERRL      Mouth/Throat: Mucous membranes are moist.       Neck: Supple with no signs of meningismus. Cardiovascular: Regular rate and rhythm. No murmurs, gallops, or rubs. 2+ symmetrical distal pulses are present in all extremities. No JVD. Respiratory: Normal respiratory effort. Lungs are clear to auscultation bilaterally. No wheezes, crackles, or rhonchi.  Gastrointestinal: Soft, non tender, and non distended with positive bowel sounds. No rebound or guarding. Genitourinary: No CVA tenderness. Musculoskeletal: Nontender with normal range of motion in all extremities. No edema, cyanosis, or erythema of extremities. Neurologic:  Normal speech and language. Face is symmetric. Moving all extremities. No gross focal neurologic deficits are appreciated. Skin: Skin is warm, dry and intact. No rash noted. Psychiatric: Mood and affect are normal. Speech and behavior are normal.  ____________________________________________   LABS (all labs ordered are listed, but only abnormal results are displayed)  Labs Reviewed  BASIC METABOLIC PANEL - Abnormal; Notable for the following:       Result Value   Sodium 133 (*)    Chloride 96 (*)    All other components within normal limits  CBC - Abnormal; Notable for the following:    WBC 11.9 (*)    RBC 3.90 (*)    Hemoglobin 10.8 (*)    HCT 32.6 (*)    RDW 16.7 (*)    Platelets 578 (*)    All other components within normal limits  GLUCOSE, CAPILLARY - Abnormal; Notable for the following:    Glucose-Capillary 103 (*)    All other components within normal limits  TROPONIN I  TROPONIN I   ____________________________________________  EKG  ED ECG REPORT I, Rudene Re, the attending physician, personally viewed and interpreted this ECG.  Sinus rhythm, rate of 70, right bundle branch block, normal QTC, left axis deviation, no ST elevations or depressions. Unchanged from  prior. ____________________________________________  RADIOLOGY  CXR: Negative ____________________________________________   PROCEDURES  Procedure(s) performed: None Procedures Critical Care performed:  None ____________________________________________   INITIAL IMPRESSION / ASSESSMENT AND PLAN / ED COURSE    75 y.o. male history of diabetes, hypertension, hypothyroidism, CVA who presents for evaluation of chest pain. History is concerning for cardiac ischemia. Patient's heart score is a 6. Is currently pain-free. ECHO with multiple wall motion abnormalities in the past and NSTEMI. EKG with no acute changes. First troponin negative. CXR pending. Received troponin PTA. Will admit for further evaluation  Clinical Course  Comment By Time  Patient evaluated by hospitalist for admission however refuses admission. We were able to convince patient to stay for a second troponin in 3 hours. Troponin is just sent to the lab. Patient remains chest pain-free. I have had a long discussion with patient and his wife about my concerns the patient has undiagnosed heart disease at an was really concerned about this episode of chest pain. Wife and patient have expressed little trust in this hospital and the doctors here and did not feel that admission and a stress test is in the best interest of the patient. They decided they want to follow up with her primary care doctor who is the "only Dr.they trust" on Monday. Rudene Re, MD 08/04 2022   _________________________ 8:51 PM on 09/17/2015 -----------------------------------------  Repeat troponin is negative. Patient remains chest pain-free. Continues to refuse admission to the hospital. Patient will follow-up with his doctor Monday. I explained to the patient that we are happy to see him if he changes his mind or if the pain returns. We'll discharge patient.   Pertinent labs & imaging results that were available during my care of the patient  were reviewed by me and considered in my medical decision making (see chart for details).    ____________________________________________   FINAL CLINICAL IMPRESSION(S) / ED DIAGNOSES  Final diagnoses:  Chest pain, unspecified chest pain type      NEW MEDICATIONS STARTED DURING THIS VISIT:  New Prescriptions   No medications on file     Note:  This document was prepared using Dragon voice recognition software and may include unintentional dictation errors.  Rudene Re, MD 09/17/15 2052

## 2015-09-17 NOTE — ED Notes (Signed)
According to patient, Dr. Darvin Neighbours came by as admitting provider and said he wanted him to have a nuclear stress test.  Patient refused to have the test.

## 2015-09-17 NOTE — ED Notes (Signed)
MD at bedside. 

## 2015-09-17 NOTE — ED Triage Notes (Signed)
Pt came to ED c/o chest pain. Pt describes tightness to center of chest. Sent over from doctors office. Given 4 aspirin.

## 2015-09-19 ENCOUNTER — Encounter: Payer: Self-pay | Admitting: Family Medicine

## 2015-09-19 DIAGNOSIS — R9431 Abnormal electrocardiogram [ECG] [EKG]: Secondary | ICD-10-CM | POA: Insufficient documentation

## 2015-09-19 DIAGNOSIS — R079 Chest pain, unspecified: Secondary | ICD-10-CM | POA: Insufficient documentation

## 2015-09-19 DIAGNOSIS — L723 Sebaceous cyst: Secondary | ICD-10-CM | POA: Insufficient documentation

## 2015-09-19 NOTE — Assessment & Plan Note (Addendum)
Awaiting MRI of liver; I have communicated earlier with patient's hematologist-oncologist; unfortunately, insurance would not cover for me to order alpha-fetoprotein, CEA, or CA19-9 as suggested by him; will have to wait on MRI; patient continues to lose weight despite normal TSH and controlled diabetes

## 2015-09-19 NOTE — Assessment & Plan Note (Signed)
EKG done today and compared to EKG done just last week at cardiologist's office; he has new inverted T waves in the high lateral leads; may be suggestive of ischemia; 911 was called and patient was given 4 tablets of 81 mg aspirin, placed on supplemental oxygen (we did not have nasal cannula, so peds non-rebreather used and placed near his mouth/nose); EMS arrived and transported patient to the ER in stable condition

## 2015-09-19 NOTE — Assessment & Plan Note (Signed)
Patient reported chest pain starting 45 minutes prior to visit; 5 or 6 out of 10; by the time EMS arrived, pain was down to a 1 out of 10; he just saw cardiologist last week; EKG showed new findings suggestive of ischemia in the high lateral leads (I and aVL); 911 was called, EMS transported patient to the ER

## 2015-09-19 NOTE — Assessment & Plan Note (Signed)
Upper left side back; lesion appears to be sebaceous cyst; cleaned with alcohol, ethyl chloride used to cool skin temp, then very small incision made with #11 blade; entire capsule and cyst removed in toto, with dark coloration (oxidized sebaceous material) removed along with it; no remaining dark pigment left at base; covered with antibiotic ointment and band-aid

## 2015-09-21 ENCOUNTER — Encounter: Payer: Self-pay | Admitting: Family Medicine

## 2015-09-23 ENCOUNTER — Encounter: Payer: Self-pay | Admitting: Oncology

## 2015-09-25 ENCOUNTER — Ambulatory Visit
Admission: RE | Admit: 2015-09-25 | Discharge: 2015-09-25 | Disposition: A | Discharge status: Home / Self Care | Payer: Medicare Other | Source: Ambulatory Visit | Attending: Family Medicine | Admitting: Family Medicine

## 2015-09-25 ENCOUNTER — Encounter: Payer: Self-pay | Admitting: Family Medicine

## 2015-09-25 DIAGNOSIS — I700 Atherosclerosis of aorta: Secondary | ICD-10-CM | POA: Diagnosis not present

## 2015-09-25 DIAGNOSIS — R932 Abnormal findings on diagnostic imaging of liver and biliary tract: Secondary | ICD-10-CM | POA: Insufficient documentation

## 2015-09-25 DIAGNOSIS — R160 Hepatomegaly, not elsewhere classified: Secondary | ICD-10-CM | POA: Diagnosis not present

## 2015-09-25 DIAGNOSIS — K862 Cyst of pancreas: Secondary | ICD-10-CM | POA: Insufficient documentation

## 2015-09-25 DIAGNOSIS — R599 Enlarged lymph nodes, unspecified: Secondary | ICD-10-CM | POA: Insufficient documentation

## 2015-09-25 DIAGNOSIS — E278 Other specified disorders of adrenal gland: Secondary | ICD-10-CM | POA: Diagnosis not present

## 2015-09-25 DIAGNOSIS — K7689 Other specified diseases of liver: Secondary | ICD-10-CM | POA: Diagnosis not present

## 2015-09-25 MED ORDER — GADOBENATE DIMEGLUMINE 529 MG/ML IV SOLN
13.0000 mL | Freq: Once | INTRAVENOUS | Status: AC | PRN
  Administered 2015-09-25: 13 mL via INTRAVENOUS

## 2015-09-26 ENCOUNTER — Telehealth: Payer: Self-pay | Admitting: Family Medicine

## 2015-09-26 DIAGNOSIS — K2289 Other specified disease of esophagus: Secondary | ICD-10-CM

## 2015-09-26 DIAGNOSIS — R160 Hepatomegaly, not elsewhere classified: Secondary | ICD-10-CM

## 2015-09-26 DIAGNOSIS — K228 Other specified diseases of esophagus: Secondary | ICD-10-CM | POA: Insufficient documentation

## 2015-09-26 DIAGNOSIS — K869 Disease of pancreas, unspecified: Secondary | ICD-10-CM | POA: Insufficient documentation

## 2015-09-26 NOTE — Telephone Encounter (Signed)
I tried to call twice, first time sounded like a disconnect, but I did hear her voice on the message; 2nd time was not her voice; left msg that I need to talk with them about his MRI and I'll try back soon If we miss each other, contact Dr. Gary Fleet office Monday morning (I am away this entire week)

## 2015-09-26 NOTE — Assessment & Plan Note (Signed)
Refer to GI for consideration of biopsy

## 2015-09-26 NOTE — Telephone Encounter (Signed)
I spoke with patient and his wife on Sunday evening; he had MRI done yesterday (Saturday); I apologized that I am on vacation and have to go over this report on the phone He appears to have worrisome thickening at the esophagogastric junction, worrisome place in liver; also has places in his pancreas which may be unrelated, don't know; first and foremost, need to get him in to have these places biopsied to see what we're dealing with, GI for EGD and liver biopsy (or interventional radiology for the liver) They have appointment with Dr. Grayland Ormond on Wednesday I will send him a note to inform him of these findings and ask him to coordinate getting testing, diagnosis, and treatment initiated I offered support, and will check MyChart daily while I am away on vacation

## 2015-09-26 NOTE — Assessment & Plan Note (Signed)
Refer to GI, notify onc

## 2015-09-27 ENCOUNTER — Telehealth: Payer: Self-pay | Admitting: *Deleted

## 2015-09-27 NOTE — Telephone Encounter (Signed)
MRI has been reviewed by Dr. Grayland Ormond and will be discussed with pt and wife at next appt on Wed 09/29/15. Left voicemail with patient informing him of this information.

## 2015-09-28 DIAGNOSIS — D509 Iron deficiency anemia, unspecified: Secondary | ICD-10-CM | POA: Insufficient documentation

## 2015-09-28 DIAGNOSIS — C787 Secondary malignant neoplasm of liver and intrahepatic bile duct: Secondary | ICD-10-CM | POA: Insufficient documentation

## 2015-09-28 NOTE — Telephone Encounter (Signed)
COMPLETED

## 2015-09-28 NOTE — Progress Notes (Signed)
[place]  [phone] [phone]  ID: NICHOLES HUML OB: 1940-07-06  MR#: [id number]  [id number]  Patient Care Team: Arnetha Courser, MD as PCP - General (Family Medicine) D Orion Modest, OD (Optometry) Wellington Hampshire, MD as Consulting Physician (Cardiology) Lloyd Huger, MD as Consulting Physician (Oncology)  CHIEF COMPLAINT: Iron deficiency anemia, GE junction thickening, liver metastasis.  INTERVAL HISTORY:  Patient returns to clinic today as an add-on for further evaluation and discussion of his MRI results. Over the past month patient has had persistent vomiting and weight loss. Subsequent MRI revealed esophageal thickening as well as lesions in his liver concerning for metastasis. He continues to have weakness and fatigue which is chronic and unchanged.  He has no neurologic complaints. He denies any fevers. He denies any chest pain or shortness of breath. He denies any nausea, vomiting, consultation, or diarrhea. He denies any melena or hematochezia. Patient offers no further specific complaints.  REVIEW OF SYSTEMS:   Review of Systems  Constitutional: Positive for malaise/fatigue and weight loss. Negative for fever.  Respiratory: Negative.  Negative for cough and shortness of breath.   Cardiovascular: Negative.  Negative for chest pain.  Gastrointestinal: Positive for vomiting. Negative for abdominal pain, blood in stool and melena.  Musculoskeletal: Negative.   Skin: Negative.   Neurological: Positive for weakness.  Psychiatric/Behavioral: Negative.     As per HPI. Otherwise, a complete review of systems is negatve.  PAST MEDICAL HISTORY: Past Medical History:  Diagnosis Date  . Arthritis   . 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 Diabetes mellitus without complication ([place])   . 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 Hearing loss    left ear  . Hypertension   . Hypothyroidism   . Mild left  atrial enlargement 08/12/2015   Very mild; LA 4.2 cm; echo March 2017  . Mitral valvular regurgitation 08/12/2015  . Sleep apnea   . Stroke ([place])   . TIA (transient ischemic attack)   . 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 APPENDECTOMY    . EYE SURGERY    . FRACTURE SURGERY    . NOSE SURGERY      FAMILY HISTORY Family History  Problem Relation Age of Onset  . Hypertension Mother   . Lung cancer Father   . Hypertension Brother        ADVANCED DIRECTIVES:    HEALTH MAINTENANCE: Social History  Substance Use Topics  . Smoking status: Former Smoker    Packs/day: 1.00    Types: Cigarettes    Quit date: 10/03/1983  . Smokeless tobacco: Never Used  . Alcohol use 0.0 oz/week     Comment: rarely     Colonoscopy:  PAP:  Bone density:  Lipid panel:  Allergies  Allergen Reactions  . Sulfa Antibiotics Itching and Hives  . Amoxicillin Hives  . Ampicillin Hives  . Keflex [Cephalexin] Itching  . Lisinopril Other (See Comments)  . Losartan Other (See Comments)  . Sulfamethoxazole-Trimethoprim Other (See Comments)  . Cephalosporins Hives    Current Outpatient Prescriptions  Medication Sig Dispense Refill  . aspirin EC 81 MG tablet Take 162 mg by mouth 2 (two) times daily.    . Biotin (BIOTIN 5000) 5 MG CAPS Take by mouth.    . Cholecalciferol (VITAMIN D3) 1000 UNITS CAPS Take 2,000 Units by mouth daily.    Marland Kitchen  Coenzyme Q10 (CO Q-10) 200 MG CAPS Take by mouth.    Marland Kitchen FREESTYLE LITE test strip USE 1 STRIP BID  3  . isosorbide mononitrate (IMDUR) 60 MG 24 hr tablet Take 1 tablet (60 mg total) by mouth daily. 30 tablet 0  . levothyroxine (SYNTHROID, LEVOTHROID) 100 MCG tablet Take 100 mcg by mouth every morning.    . Lutein 40 MG CAPS Take by mouth.    . Magnesium Oxide 250 MG TABS Take 1 tablet (250 mg total) by mouth daily.  0  . metFORMIN (GLUCOPHAGE) 1000 MG tablet Take  2,000 mg by mouth daily.    . metoprolol tartrate (LOPRESSOR) 25 MG tablet Take 12.5 mg by mouth 2 (two) times daily.     . Multiple Vitamins-Minerals (MULTI FOR HIM 50+ PO) Take 1 tablet by mouth daily.    . Omega-3 Fatty Acids (FISH OIL) 1000 MG CAPS Take 1 capsule by mouth daily.    . Red Yeast Rice 600 MG TABS Take 2 tablets by mouth at bedtime.     No current facility-administered medications for this visit.     OBJECTIVE: Vitals:   09/29/15 1119  BP: 127/69  Pulse: 69  Temp: 97.8 F (36.6 C)     Body mass index is 21.68 kg/m.    ECOG FS:1 - Symptomatic but completely ambulatory  General: Well-developed, well-nourished, no acute distress. Eyes: Pink conjunctiva, anicteric sclera. Lungs: Clear to auscultation bilaterally. Heart: Regular rate and rhythm. No rubs, murmurs, or gallops. Abdomen: Soft, nontender, nondistended. No organomegaly noted, normoactive bowel sounds. Musculoskeletal: No edema, cyanosis, or clubbing. Neuro: Alert, answering all questions appropriately. Cranial nerves grossly intact. Skin: No rashes or petechiae noted. Psych: Normal affect.   LAB RESULTS:  Lab Results  Component Value Date   NA 133 (L) 09/17/2015   K 4.7 09/17/2015   CL 96 (L) 09/17/2015   CO2 27 09/17/2015   GLUCOSE 96 09/17/2015   BUN 15 09/17/2015   CREATININE 0.67 09/17/2015   CALCIUM 9.4 09/17/2015   PROT 7.2 09/13/2015   ALBUMIN 4.4 09/13/2015   AST 15 09/13/2015   ALT 11 09/13/2015   ALKPHOS 53 09/13/2015   BILITOT 0.3 09/13/2015   GFRNONAA >60 09/17/2015   GFRAA >60 09/17/2015    Lab Results  Component Value Date   WBC 12.1 (H) 09/29/2015   NEUTROABS 9.5 (H) 09/29/2015   HGB 10.5 (L) 09/29/2015   HCT 31.1 (L) 09/29/2015   MCV 83.0 09/29/2015   PLT 688 (H) 09/29/2015   Lab Results  Component Value Date   IRON 30 (L) 09/29/2015   TIBC 355 09/29/2015   IRONPCTSAT 9 (L) 09/29/2015    Lab Results  Component Value Date   FERRITIN 72 09/29/2015      STUDIES: Dg Chest 2 View  Result Date: 09/17/2015 CLINICAL DATA:  Chest pain EXAM: CHEST  2 VIEW COMPARISON:  08/05/2015 FINDINGS: Normal heart size. No pleural effusion or edema. No airspace consolidation identified. Calcified granuloma identified within the right upper lobe. Spondylosis is noted throughout the thoracic spine. IMPRESSION: No active cardiopulmonary disease. Electronically Signed   By: Kerby Moors M.D.   On: 09/17/2015 19:03   Mr Liver W Wo Contrast  Result Date: 09/25/2015 CLINICAL DATA:  Indeterminate right liver lobe mass on recent abdominal sonogram performed in the setting of nausea. EXAM: MRI ABDOMEN WITHOUT AND WITH CONTRAST TECHNIQUE: Multiplanar multisequence MR imaging of the abdomen was performed both before and after the administration of intravenous contrast. CONTRAST:  76mL MULTIHANCE GADOBENATE DIMEGLUMINE 529 MG/ML IV SOLN COMPARISON:  09/08/2015 abdominal sonogram. FINDINGS: Lower chest: Clear lung bases. Hepatobiliary: Normal liver size and configuration. There is signal loss throughout the liver on inphase chemical shift imaging, consistent with hepatic iron deposition. There is a solitary 2.3 x 1.9 cm segment 5 right liver lobe mass (series 13/ image 34), which demonstrates mild T2 hyperintensity, restricted diffusion, irregular margins and heterogeneous hyperenhancement. There is a separate 0.7 cm hyperenhancing liver mass at the far anterior inferior right liver margin (series 19/ image 61) with similar imaging features. No additional liver mass. Normal gallbladder with no cholelithiasis. No biliary ductal dilatation. Common bile duct diameter 4 mm. No choledocholithiasis. Pancreas: No main pancreatic duct dilation. No pancreas divisum. There are several scattered unilocular lobulated nonenhancing cystic lesions throughout the pancreas (at least 5), largest 1.1 x 0.6 x 1.3 cm in the posterior pancreatic body (series 8/image 11). Spleen: Normal size. No mass.  Adrenals/Urinary Tract: Normal right adrenal. Indeterminate 1.1 cm enhancing left adrenal nodule (series 23/ image 30), without appreciable signal loss on out of phase chemical shift imaging. No hydronephrosis. Subcentimeter simple right renal cyst. No suspicious renal masses. Stomach/Bowel: There is irregular annular wall thickening and hyperenhancement at the esophagogastric junction. Probable small hiatal hernia. Otherwise grossly normal stomach. Visualized small and large bowel is normal caliber, with no bowel wall thickening. Vascular/Lymphatic: Atherosclerotic nonaneurysmal abdominal aorta. Patent portal, splenic, hepatic and renal veins. There is a heterogeneously enhancing 5.7 x 4.4 cm mass in the gastrohepatic ligament (series 19/image 20), which is intimately associated with the celiac trunk and bifurcation and is favored to represent conglomerate adenopathy. No additional pathologically enlarged lymph nodes in the abdomen. Other: No abdominal ascites or focal fluid collection. Musculoskeletal: No aggressive appearing focal osseous lesions. IMPRESSION: 1. Irregular annular wall thickening and hyperenhancement at the esophagogastric junction, worrisome for malignancy. Upper endoscopic correlation advised. 2. Bulky confluent adenopathy in the gastrohepatic ligament worrisome for nodal metastases. 3. Two enhancing right inferior liver lobe masses, largest 2.3 cm in segment 5 with irregular margins, worrisome for liver metastases. The dominant liver mass should be amenable to percutaneous biopsy as clinically warranted. 4. Indeterminate small left adrenal nodule, cannot exclude left adrenal metastasis. 5. **An incidental finding of potential clinical significance has been found. 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 A follow-up MRI abdomen without and with IV contrast is  recommended in 12 months. This recommendation follows ACR consensus guidelines: Managing Incidental Findings on Abdominal CT: White Paper of the ACR Incidental Findings Committee. J Am Coll Radiol 2010;7:754-773.** 6. Diffuse hepatic iron deposition, probably transfusion related. 7. Atherosclerotic nonaneurysmal abdominal aorta. Electronically Signed   By: Ilona Sorrel M.D.   On: 09/25/2015 15:32   US Abdomen Limited Ruq  Result Date: 09/08/2015 CLINICAL DATA:  Two months of intermittent nausea EXAM: US ABDOMEN LIMITED - RIGHT UPPER QUADRANT COMPARISON:  None in PACs FINDINGS: Gallbladder: No gallstones or wall thickening visualized. No sonographic Murphy sign noted by sonographer. Common bile duct: Diameter: 3.5 mm Liver: The hepatic echotexture is mildly increased diffusely. In the right hepatic lobe there is a hypoechoic focus measuring 2.1 x 1.8 x 2 cm. It does not appear hypervascular. There is no intrahepatic ductal dilation. The surface contour of the liver is normal. IMPRESSION: 1. Abnormal hypoechoic solid-appearing focus in the right hepatic lobe measuring up to 2.1 cm. This may reflect an inflammatory or neoplastic process.  A non fat containing hemangioma could produce a similar appearance. Hepatic protocol MRI is recommended. 2. Normal appearance of the gallbladder and common bile duct. Electronically Signed   By: David  Martinique M.D.   On: 09/08/2015 09:57   ASSESSMENT:  Iron deficiency anemia, GE junction thickening, liver metastasis.  PLAN:    1. GE junction thickening, liver metastasis: Highly concerning for underlying malignancy. This is also likely contributing to his weight loss. Have ordered a PET scan for further evaluation. Referral has been sent to GI. We will also get an ultrasound-guided liver biopsy for possible diagnosis as well as staging purposes. Finally, patient has been referred for consideration of EUS. Return to clinic in 1-2 weeks after his biopsy to discuss the results  and treatment planning if necessary. 2. Iron deficiency anemia: Patient's hemoglobin and iron stores have trended down. He will require IV iron in the future, but will hold treatment until after workup for his possible malignancy is completed.,  3. Recent MI: Treatment per cardiology. 4. Weight loss: Likely secondary to underlying malignancy, monitor.  Approximately 30 minutes was spent in discussion of which greater than 50% was consultation.  Patient expressed understanding and was in agreement with this plan. He also understands that He can call clinic at any time with any questions, concerns, or complaints.    Lloyd Huger, MD   09/29/2015 1:22 PM

## 2015-09-29 ENCOUNTER — Other Ambulatory Visit: Payer: Self-pay | Admitting: *Deleted

## 2015-09-29 ENCOUNTER — Inpatient Hospital Stay: Payer: Medicare Other

## 2015-09-29 ENCOUNTER — Inpatient Hospital Stay: Payer: Medicare Other | Attending: Oncology | Admitting: Oncology

## 2015-09-29 VITALS — BP 127/69 | HR 69 | Temp 97.8°F | Wt 138.4 lb

## 2015-09-29 DIAGNOSIS — I340 Nonrheumatic mitral (valve) insufficiency: Secondary | ICD-10-CM | POA: Diagnosis not present

## 2015-09-29 DIAGNOSIS — C787 Secondary malignant neoplasm of liver and intrahepatic bile duct: Secondary | ICD-10-CM | POA: Diagnosis not present

## 2015-09-29 DIAGNOSIS — K228 Other specified diseases of esophagus: Secondary | ICD-10-CM

## 2015-09-29 DIAGNOSIS — R112 Nausea with vomiting, unspecified: Secondary | ICD-10-CM | POA: Diagnosis not present

## 2015-09-29 DIAGNOSIS — G4730 Sleep apnea, unspecified: Secondary | ICD-10-CM

## 2015-09-29 DIAGNOSIS — D509 Iron deficiency anemia, unspecified: Secondary | ICD-10-CM | POA: Diagnosis not present

## 2015-09-29 DIAGNOSIS — R531 Weakness: Secondary | ICD-10-CM | POA: Insufficient documentation

## 2015-09-29 DIAGNOSIS — Z801 Family history of malignant neoplasm of trachea, bronchus and lung: Secondary | ICD-10-CM | POA: Diagnosis not present

## 2015-09-29 DIAGNOSIS — Z7982 Long term (current) use of aspirin: Secondary | ICD-10-CM | POA: Diagnosis not present

## 2015-09-29 DIAGNOSIS — K769 Liver disease, unspecified: Secondary | ICD-10-CM | POA: Diagnosis not present

## 2015-09-29 DIAGNOSIS — K2289 Other specified disease of esophagus: Secondary | ICD-10-CM

## 2015-09-29 DIAGNOSIS — E039 Hypothyroidism, unspecified: Secondary | ICD-10-CM | POA: Insufficient documentation

## 2015-09-29 DIAGNOSIS — I10 Essential (primary) hypertension: Secondary | ICD-10-CM | POA: Insufficient documentation

## 2015-09-29 DIAGNOSIS — M5134 Other intervertebral disc degeneration, thoracic region: Secondary | ICD-10-CM

## 2015-09-29 DIAGNOSIS — E119 Type 2 diabetes mellitus without complications: Secondary | ICD-10-CM | POA: Diagnosis not present

## 2015-09-29 DIAGNOSIS — R5383 Other fatigue: Secondary | ICD-10-CM

## 2015-09-29 DIAGNOSIS — D649 Anemia, unspecified: Secondary | ICD-10-CM

## 2015-09-29 DIAGNOSIS — Z8673 Personal history of transient ischemic attack (TIA), and cerebral infarction without residual deficits: Secondary | ICD-10-CM

## 2015-09-29 DIAGNOSIS — I77811 Abdominal aortic ectasia: Secondary | ICD-10-CM

## 2015-09-29 DIAGNOSIS — Z7984 Long term (current) use of oral hypoglycemic drugs: Secondary | ICD-10-CM | POA: Insufficient documentation

## 2015-09-29 DIAGNOSIS — R634 Abnormal weight loss: Secondary | ICD-10-CM | POA: Insufficient documentation

## 2015-09-29 DIAGNOSIS — I252 Old myocardial infarction: Secondary | ICD-10-CM | POA: Insufficient documentation

## 2015-09-29 DIAGNOSIS — M129 Arthropathy, unspecified: Secondary | ICD-10-CM | POA: Diagnosis not present

## 2015-09-29 DIAGNOSIS — R970 Elevated carcinoembryonic antigen [CEA]: Secondary | ICD-10-CM | POA: Diagnosis not present

## 2015-09-29 DIAGNOSIS — C801 Malignant (primary) neoplasm, unspecified: Secondary | ICD-10-CM | POA: Diagnosis not present

## 2015-09-29 DIAGNOSIS — R933 Abnormal findings on diagnostic imaging of other parts of digestive tract: Secondary | ICD-10-CM | POA: Diagnosis not present

## 2015-09-29 DIAGNOSIS — Z87891 Personal history of nicotine dependence: Secondary | ICD-10-CM | POA: Insufficient documentation

## 2015-09-29 DIAGNOSIS — I771 Stricture of artery: Secondary | ICD-10-CM | POA: Diagnosis not present

## 2015-09-29 LAB — IRON AND TIBC
IRON: 30 ug/dL — AB (ref 45–182)
SATURATION RATIOS: 9 % — AB (ref 17.9–39.5)
TIBC: 355 ug/dL (ref 250–450)
UIBC: 325 ug/dL

## 2015-09-29 LAB — CBC WITH DIFFERENTIAL/PLATELET
BASOS ABS: 0.1 10*3/uL (ref 0–0.1)
BASOS PCT: 1 %
EOS ABS: 0.2 10*3/uL (ref 0–0.7)
EOS PCT: 2 %
HCT: 31.1 % — ABNORMAL LOW (ref 40.0–52.0)
Hemoglobin: 10.5 g/dL — ABNORMAL LOW (ref 13.0–18.0)
Lymphocytes Relative: 10 %
Lymphs Abs: 1.2 10*3/uL (ref 1.0–3.6)
MCH: 28 pg (ref 26.0–34.0)
MCHC: 33.8 g/dL (ref 32.0–36.0)
MCV: 83 fL (ref 80.0–100.0)
MONO ABS: 1.1 10*3/uL — AB (ref 0.2–1.0)
Monocytes Relative: 9 %
Neutro Abs: 9.5 10*3/uL — ABNORMAL HIGH (ref 1.4–6.5)
Neutrophils Relative %: 78 %
PLATELETS: 688 10*3/uL — AB (ref 150–440)
RBC: 3.74 MIL/uL — ABNORMAL LOW (ref 4.40–5.90)
RDW: 16.4 % — AB (ref 11.5–14.5)
WBC: 12.1 10*3/uL — ABNORMAL HIGH (ref 3.8–10.6)

## 2015-09-29 LAB — FOLATE: FOLATE: 38 ng/mL (ref >5.9)

## 2015-09-29 LAB — VITAMIN B12: Vitamin B-12: 627 pg/mL (ref 180–914)

## 2015-09-29 LAB — FERRITIN: FERRITIN: 72 ng/mL (ref 24–336)

## 2015-09-29 NOTE — Progress Notes (Signed)
Patient ambulates without assistance, brought to exam room 8, accompanied by his wife.  Patient denies pain or discomfort at this time, but states he has intermittent lower abdominal pain/pressure causing discomfort.   BP 127/69 HR 69  Mediation record updated information provided by patient and wife.

## 2015-09-30 ENCOUNTER — Other Ambulatory Visit: Payer: Self-pay | Admitting: *Deleted

## 2015-09-30 ENCOUNTER — Other Ambulatory Visit: Payer: Self-pay

## 2015-09-30 ENCOUNTER — Telehealth: Payer: Self-pay

## 2015-09-30 NOTE — Telephone Encounter (Signed)
  Oncology Nurse Navigator Documentation  Navigator Location: [facility] Onc (09/30/15 1000) Navigator Encounter Type: Introductory phone call;Telephone (09/30/15 1000) Telephone: Outgoing Call;Education (09/30/15 1000)             Barriers/Navigation Needs: Coordination of Care (09/30/15 1000)   Interventions: Coordination of Care (09/30/15 1000)   Coordination of Care: EUS (09/30/15 1000)        Acuity: Level 3 (09/30/15 1000)     Acuity Level 3: Ongoing guidance and education provided throughout treatment;Coordination of multimodality treatment (09/30/15 1000)   Time Spent with Patient: 60 (09/30/15 1000)   Received referral for EUS to obtain biopsy of gastrohepatic ligament and esophagus and staging. Scheduled for 10/07/15 at [facility] with Dr Mont Dutton. Went over instructions for procedure with spouse [name]. Numerous questions ans concerns answered and addressed. Her main concern is with his diabetes and being NPO. Educated on being able to have clear liquids than can contain sugar to keep blood sugar from dropping. Also provided her with endoscopy phone number for further instructions from anesthesia for safety. Only anticoagulant is aspirin. Copt of instructions mailed to home address along with my contact information for any future questions or concerns. Scheduling asked to change PET date. Per spouse he cannot have all of these procedures in the same day.   INSTRUCTIONS FOR ENDOSCOPIC ULTRASOUND -Your procedure has been scheduled for August 24th with Dr Francella Solian at [facility] -The hospital will contact you to pre-register over the phone.  -To get your scheduled arrival time, please call the Endoscopy unit at  [phone] between 1-3pm on:  August 23rd      -ON THE DAY OF YOU PROCEDURE:   1. If you are scheduled for a morning procedure, nothing to drink after midnight  -If you are scheduled for an afternoon procedure, you may have clear liquids until 5 hours prior  to the procedure but  no carbonated drinks or broth  2. NO FOOD THE DAY OF YOUR PROCEDURE  3. You may take your heart, seizure, blood pressure, Parkinson's or breathing medications at  6am with just enough water to get your pills down  4. Do not take any oral Diabetic medications the morning of your procedure.  5. If you are a diabetic and are using insulin, please notify your prescribing physician of this  procedure as your dose may need to be altered related to not being able to eat or drink.   5. Do not take Vitamins for 5-7 days before your procedure     -On the day of your procedure, come to the [facility] Admitting/Registration desk (First desk on the right) at the scheduled arrival time. You MUST have someone drive you home from your procedure. You must have a responsible adult with a valid drivers license who is on site throughout your entire procedure and who can stay with you for several hours after your procedure. You may not go home alone in a taxi, shuttle [place] or bus, as the drivers will not be responsible for you.  --If you have any questions please call me at the above contact

## 2015-10-06 ENCOUNTER — Encounter: Payer: Self-pay | Admitting: *Deleted

## 2015-10-07 ENCOUNTER — Telehealth: Payer: Self-pay

## 2015-10-07 ENCOUNTER — Ambulatory Visit: Payer: Medicare Other

## 2015-10-07 ENCOUNTER — Encounter: Payer: Self-pay | Admitting: *Deleted

## 2015-10-07 ENCOUNTER — Ambulatory Visit: Payer: Medicare Other | Admitting: Certified Registered"

## 2015-10-07 ENCOUNTER — Other Ambulatory Visit: Payer: Medicare Other

## 2015-10-07 ENCOUNTER — Ambulatory Visit
Admission: RE | Admit: 2015-10-07 | Discharge: 2015-10-07 | Disposition: A | Discharge status: Home / Self Care | Payer: Medicare Other | Source: Ambulatory Visit | Attending: Internal Medicine | Admitting: Internal Medicine

## 2015-10-07 ENCOUNTER — Encounter
Admission: RE | Disposition: A | Discharge status: Home / Self Care | Payer: Self-pay | Source: Ambulatory Visit | Attending: Internal Medicine

## 2015-10-07 DIAGNOSIS — H9190 Unspecified hearing loss, unspecified ear: Secondary | ICD-10-CM | POA: Diagnosis not present

## 2015-10-07 DIAGNOSIS — Z881 Allergy status to other antibiotic agents status: Secondary | ICD-10-CM | POA: Diagnosis not present

## 2015-10-07 DIAGNOSIS — I517 Cardiomegaly: Secondary | ICD-10-CM | POA: Diagnosis not present

## 2015-10-07 DIAGNOSIS — I739 Peripheral vascular disease, unspecified: Secondary | ICD-10-CM | POA: Diagnosis not present

## 2015-10-07 DIAGNOSIS — Z888 Allergy status to other drugs, medicaments and biological substances status: Secondary | ICD-10-CM | POA: Diagnosis not present

## 2015-10-07 DIAGNOSIS — C787 Secondary malignant neoplasm of liver and intrahepatic bile duct: Secondary | ICD-10-CM | POA: Insufficient documentation

## 2015-10-07 DIAGNOSIS — Z801 Family history of malignant neoplasm of trachea, bronchus and lung: Secondary | ICD-10-CM | POA: Insufficient documentation

## 2015-10-07 DIAGNOSIS — R160 Hepatomegaly, not elsewhere classified: Secondary | ICD-10-CM | POA: Diagnosis not present

## 2015-10-07 DIAGNOSIS — C159 Malignant neoplasm of esophagus, unspecified: Secondary | ICD-10-CM | POA: Diagnosis not present

## 2015-10-07 DIAGNOSIS — Z7984 Long term (current) use of oral hypoglycemic drugs: Secondary | ICD-10-CM | POA: Diagnosis not present

## 2015-10-07 DIAGNOSIS — I10 Essential (primary) hypertension: Secondary | ICD-10-CM | POA: Insufficient documentation

## 2015-10-07 DIAGNOSIS — K228 Other specified diseases of esophagus: Secondary | ICD-10-CM | POA: Diagnosis not present

## 2015-10-07 DIAGNOSIS — M5134 Other intervertebral disc degeneration, thoracic region: Secondary | ICD-10-CM | POA: Diagnosis not present

## 2015-10-07 DIAGNOSIS — E278 Other specified disorders of adrenal gland: Secondary | ICD-10-CM | POA: Insufficient documentation

## 2015-10-07 DIAGNOSIS — Z7982 Long term (current) use of aspirin: Secondary | ICD-10-CM | POA: Diagnosis not present

## 2015-10-07 DIAGNOSIS — M1990 Unspecified osteoarthritis, unspecified site: Secondary | ICD-10-CM | POA: Diagnosis not present

## 2015-10-07 DIAGNOSIS — Z87891 Personal history of nicotine dependence: Secondary | ICD-10-CM | POA: Diagnosis not present

## 2015-10-07 DIAGNOSIS — Z8249 Family history of ischemic heart disease and other diseases of the circulatory system: Secondary | ICD-10-CM | POA: Insufficient documentation

## 2015-10-07 DIAGNOSIS — D649 Anemia, unspecified: Secondary | ICD-10-CM | POA: Insufficient documentation

## 2015-10-07 DIAGNOSIS — Z79899 Other long term (current) drug therapy: Secondary | ICD-10-CM | POA: Insufficient documentation

## 2015-10-07 DIAGNOSIS — G4730 Sleep apnea, unspecified: Secondary | ICD-10-CM | POA: Diagnosis not present

## 2015-10-07 DIAGNOSIS — D509 Iron deficiency anemia, unspecified: Secondary | ICD-10-CM | POA: Insufficient documentation

## 2015-10-07 DIAGNOSIS — Z6821 Body mass index (BMI) 21.0-21.9, adult: Secondary | ICD-10-CM | POA: Diagnosis not present

## 2015-10-07 DIAGNOSIS — E039 Hypothyroidism, unspecified: Secondary | ICD-10-CM | POA: Diagnosis not present

## 2015-10-07 DIAGNOSIS — Z8673 Personal history of transient ischemic attack (TIA), and cerebral infarction without residual deficits: Secondary | ICD-10-CM | POA: Insufficient documentation

## 2015-10-07 DIAGNOSIS — E119 Type 2 diabetes mellitus without complications: Secondary | ICD-10-CM | POA: Diagnosis not present

## 2015-10-07 DIAGNOSIS — R634 Abnormal weight loss: Secondary | ICD-10-CM | POA: Diagnosis not present

## 2015-10-07 DIAGNOSIS — Z880 Allergy status to penicillin: Secondary | ICD-10-CM | POA: Insufficient documentation

## 2015-10-07 DIAGNOSIS — I340 Nonrheumatic mitral (valve) insufficiency: Secondary | ICD-10-CM | POA: Insufficient documentation

## 2015-10-07 DIAGNOSIS — Z882 Allergy status to sulfonamides status: Secondary | ICD-10-CM | POA: Insufficient documentation

## 2015-10-07 HISTORY — PX: EUS: SHX5427

## 2015-10-07 LAB — GLUCOSE, CAPILLARY: Glucose-Capillary: 100 mg/dL — ABNORMAL HIGH (ref 65–99)

## 2015-10-07 SURGERY — ULTRASOUND, UPPER GI TRACT, ENDOSCOPIC
Anesthesia: General

## 2015-10-07 MED ORDER — LIDOCAINE 2% (20 MG/ML) 5 ML SYRINGE
INTRAMUSCULAR | Status: DC | PRN
  Administered 2015-10-07: 50 mg via INTRAVENOUS

## 2015-10-07 MED ORDER — PROPOFOL 10 MG/ML IV BOLUS
INTRAVENOUS | Status: DC | PRN
  Administered 2015-10-07: 20 mg via INTRAVENOUS
  Administered 2015-10-07: 50 mg via INTRAVENOUS

## 2015-10-07 MED ORDER — PROPOFOL 500 MG/50ML IV EMUL
INTRAVENOUS | Status: DC | PRN
  Administered 2015-10-07: 120 ug/kg/min via INTRAVENOUS

## 2015-10-07 MED ORDER — FENTANYL CITRATE (PF) 100 MCG/2ML IJ SOLN
INTRAMUSCULAR | Status: DC | PRN
  Administered 2015-10-07: 50 ug via INTRAVENOUS

## 2015-10-07 MED ORDER — SODIUM CHLORIDE 0.9 % IV SOLN
INTRAVENOUS | Status: DC
  Administered 2015-10-07: 1000 mL via INTRAVENOUS

## 2015-10-07 NOTE — Anesthesia Preprocedure Evaluation (Addendum)
Anesthesia Evaluation  Patient identified by MRN, date of birth, ID band Patient awake    Reviewed: Allergy & Precautions, NPO status , Patient's Chart, lab work & pertinent test results, reviewed documented beta blocker date and time   History of Anesthesia Complications (+) PSEUDOCHOLINESTERASE DEFICIENCY  Airway Mallampati: III       Dental   Pulmonary sleep apnea and Continuous Positive Airway Pressure Ventilation , former smoker,    Pulmonary exam normal        Cardiovascular hypertension, Pt. on medications and Pt. on home beta blockers + Peripheral Vascular Disease  Normal cardiovascular exam+ Valvular Problems/Murmurs MR      Neuro/Psych TIAnegative psych ROS   GI/Hepatic GI bleed HX   Endo/Other  diabetesHypothyroidism   Renal/GU negative Renal ROS  negative genitourinary   Musculoskeletal  (+) Arthritis , Osteoarthritis,    Abdominal Normal abdominal exam  (+)   Peds negative pediatric ROS (+)  Hematology  (+) anemia ,   Anesthesia Other Findings   Reproductive/Obstetrics                            Anesthesia Physical Anesthesia Plan  ASA: III  Anesthesia Plan: General   Post-op Pain Management:    Induction: Intravenous  Airway Management Planned: Nasal Cannula  Additional Equipment:   Intra-op Plan:   Post-operative Plan:   Informed Consent: I have reviewed the patients History and Physical, chart, labs and discussed the procedure including the risks, benefits and alternatives for the proposed anesthesia with the patient or authorized representative who has indicated his/her understanding and acceptance.   Dental advisory given  Plan Discussed with: CRNA and Surgeon  Anesthesia Plan Comments:         Anesthesia Quick Evaluation

## 2015-10-07 NOTE — Telephone Encounter (Signed)
  Oncology Nurse Navigator Documentation  Navigator Location: [facility] Onc (10/07/15 1600) Navigator Encounter Type: Telephone;Diagnostic Results;Education (10/07/15 1600)                                          Time Spent with Patient: 30 (10/07/15 1600)   [name] underwent EUS today with esophageal and liver biopsies performed. Spoke with Dr Grayland Ormond. Liver biopsy cancelled, consult with Dr Allen Norris cancelled and PET scan rescheduled for Monday due to patient spouse cancelling scan. Educated spouse on purpose for having PET scan performed. She verbalized understanding and agreed to have scan performed. She is also aware of consult with Dr Allen Norris being cancelled and reason is because biopsy was performed with EUS. She had cancelled liver biopsy herself.

## 2015-10-07 NOTE — H&P (View-Only) (Signed)
Jeremiah  [phone] [phone]  ID: [name] OB: Oct 02, 1940  MR#: [id number]  [id number]  Patient Care Team: Arnetha Courser, MD as PCP - General (Family Medicine) D Orion Modest, OD (Optometry) Wellington Hampshire, MD as Consulting Physician (Cardiology) Lloyd Huger, MD as Consulting Physician (Oncology)  CHIEF COMPLAINT: Iron deficiency anemia, GE junction thickening, liver metastasis.  INTERVAL HISTORY:  Patient returns to clinic today as an add-on for further evaluation and discussion of his MRI results. Over the past month patient has had persistent vomiting and weight loss. Subsequent MRI revealed esophageal thickening as well as lesions in his liver concerning for metastasis. He continues to have weakness and fatigue which is chronic and unchanged.  He has no neurologic complaints. He denies any fevers. He denies any chest pain or shortness of breath. He denies any nausea, vomiting, consultation, or diarrhea. He denies any melena or hematochezia. Patient offers no further specific complaints.  REVIEW OF SYSTEMS:   Review of Systems  Constitutional: Positive for malaise/fatigue and weight loss. Negative for fever.  Respiratory: Negative.  Negative for cough and shortness of breath.   Cardiovascular: Negative.  Negative for chest pain.  Gastrointestinal: Positive for vomiting. Negative for abdominal pain, blood in stool and melena.  Musculoskeletal: Negative.   Skin: Negative.   Neurological: Positive for weakness.  Psychiatric/Behavioral: Negative.     As per HPI. Otherwise, a complete review of systems is negatve.  PAST MEDICAL HISTORY: Past Medical History:  Diagnosis Date  . Arthritis   . 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 Diabetes mellitus without complication ([place])   . 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 Hearing loss    left ear  . Hypertension   . Hypothyroidism   . Mild left  atrial enlargement 08/12/2015   Very mild; LA 4.2 cm; echo March 2017  . Mitral valvular regurgitation 08/12/2015  . Sleep apnea   . Stroke ([place])   . TIA (transient ischemic attack)   . 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 APPENDECTOMY    . EYE SURGERY    . FRACTURE SURGERY    . NOSE SURGERY      FAMILY HISTORY Family History  Problem Relation Age of Onset  . Hypertension Mother   . Lung cancer Father   . Hypertension Brother        ADVANCED DIRECTIVES:    HEALTH MAINTENANCE: Social History  Substance Use Topics  . Smoking status: Former Smoker    Packs/day: 1.00    Types: Cigarettes    Quit date: 10/03/1983  . Smokeless tobacco: Never Used  . Alcohol use 0.0 oz/week     Comment: rarely     Colonoscopy:  PAP:  Bone density:  Lipid panel:  Allergies  Allergen Reactions  . Sulfa Antibiotics Itching and Hives  . Amoxicillin Hives  . Ampicillin Hives  . Keflex [Cephalexin] Itching  . Lisinopril Other (See Comments)  . Losartan Other (See Comments)  . Sulfamethoxazole-Trimethoprim Other (See Comments)  . Cephalosporins Hives    Current Outpatient Prescriptions  Medication Sig Dispense Refill  . aspirin EC 81 MG tablet Take 162 mg by mouth 2 (two) times daily.    . Biotin (BIOTIN 5000) 5 MG CAPS Take by mouth.    . Cholecalciferol (VITAMIN D3) 1000 UNITS CAPS Take 2,000 Units by mouth daily.    Marland Kitchen  Coenzyme Q10 (CO Q-10) 200 MG CAPS Take by mouth.    Marland Kitchen FREESTYLE LITE test strip USE 1 STRIP BID  3  . isosorbide mononitrate (IMDUR) 60 MG 24 hr tablet Take 1 tablet (60 mg total) by mouth daily. 30 tablet 0  . levothyroxine (SYNTHROID, LEVOTHROID) 100 MCG tablet Take 100 mcg by mouth every morning.    . Lutein 40 MG CAPS Take by mouth.    . Magnesium Oxide 250 MG TABS Take 1 tablet (250 mg total) by mouth daily.  0  . metFORMIN (GLUCOPHAGE) 1000 MG tablet Take  2,000 mg by mouth daily.    . metoprolol tartrate (LOPRESSOR) 25 MG tablet Take 12.5 mg by mouth 2 (two) times daily.     . Multiple Vitamins-Minerals (MULTI FOR HIM 50+ PO) Take 1 tablet by mouth daily.    . Omega-3 Fatty Acids (FISH OIL) 1000 MG CAPS Take 1 capsule by mouth daily.    . Red Yeast Rice 600 MG TABS Take 2 tablets by mouth at bedtime.     No current facility-administered medications for this visit.     OBJECTIVE: Vitals:   09/29/15 1119  BP: 127/69  Pulse: 69  Temp: 97.8 F (36.6 C)     Body mass index is 21.68 kg/m.    ECOG FS:1 - Symptomatic but completely ambulatory  General: Well-developed, well-nourished, no acute distress. Eyes: Pink conjunctiva, anicteric sclera. Lungs: Clear to auscultation bilaterally. Heart: Regular rate and rhythm. No rubs, murmurs, or gallops. Abdomen: Soft, nontender, nondistended. No organomegaly noted, normoactive bowel sounds. Musculoskeletal: No edema, cyanosis, or clubbing. Neuro: Alert, answering all questions appropriately. Cranial nerves grossly intact. Skin: No rashes or petechiae noted. Psych: Normal affect.   LAB RESULTS:  Lab Results  Component Value Date   NA 133 (L) 09/17/2015   K 4.7 09/17/2015   CL 96 (L) 09/17/2015   CO2 27 09/17/2015   GLUCOSE 96 09/17/2015   BUN 15 09/17/2015   CREATININE 0.67 09/17/2015   CALCIUM 9.4 09/17/2015   PROT 7.2 09/13/2015   ALBUMIN 4.4 09/13/2015   AST 15 09/13/2015   ALT 11 09/13/2015   ALKPHOS 53 09/13/2015   BILITOT 0.3 09/13/2015   GFRNONAA >60 09/17/2015   GFRAA >60 09/17/2015    Lab Results  Component Value Date   WBC 12.1 (H) 09/29/2015   NEUTROABS 9.5 (H) 09/29/2015   HGB 10.5 (L) 09/29/2015   HCT 31.1 (L) 09/29/2015   MCV 83.0 09/29/2015   PLT 688 (H) 09/29/2015   Lab Results  Component Value Date   IRON 30 (L) 09/29/2015   TIBC 355 09/29/2015   IRONPCTSAT 9 (L) 09/29/2015    Lab Results  Component Value Date   FERRITIN 72 09/29/2015      STUDIES: Dg Chest 2 View  Result Date: 09/17/2015 CLINICAL DATA:  Chest pain EXAM: CHEST  2 VIEW COMPARISON:  08/05/2015 FINDINGS: Normal heart size. No pleural effusion or edema. No airspace consolidation identified. Calcified granuloma identified within the right upper lobe. Spondylosis is noted throughout the thoracic spine. IMPRESSION: No active cardiopulmonary disease. Electronically Signed   By: Kerby Moors M.D.   On: 09/17/2015 19:03   Mr Liver W Wo Contrast  Result Date: 09/25/2015 CLINICAL DATA:  Indeterminate right liver lobe mass on recent abdominal sonogram performed in the setting of nausea. EXAM: MRI ABDOMEN WITHOUT AND WITH CONTRAST TECHNIQUE: Multiplanar multisequence MR imaging of the abdomen was performed both before and after the administration of intravenous contrast. CONTRAST:  3mL MULTIHANCE GADOBENATE DIMEGLUMINE 529 MG/ML IV SOLN COMPARISON:  09/08/2015 abdominal sonogram. FINDINGS: Lower chest: Clear lung bases. Hepatobiliary: Normal liver size and configuration. There is signal loss throughout the liver on inphase chemical shift imaging, consistent with hepatic iron deposition. There is a solitary 2.3 x 1.9 cm segment 5 right liver lobe mass (series 13/ image 34), which demonstrates mild T2 hyperintensity, restricted diffusion, irregular margins and heterogeneous hyperenhancement. There is a separate 0.7 cm hyperenhancing liver mass at the far anterior inferior right liver margin (series 19/ image 61) with similar imaging features. No additional liver mass. Normal gallbladder with no cholelithiasis. No biliary ductal dilatation. Common bile duct diameter 4 mm. No choledocholithiasis. Pancreas: No main pancreatic duct dilation. No pancreas divisum. There are several scattered unilocular lobulated nonenhancing cystic lesions throughout the pancreas (at least 5), largest 1.1 x 0.6 x 1.3 cm in the posterior pancreatic body (series 8/image 11). Spleen: Normal size. No mass.  Adrenals/Urinary Tract: Normal right adrenal. Indeterminate 1.1 cm enhancing left adrenal nodule (series 23/ image 30), without appreciable signal loss on out of phase chemical shift imaging. No hydronephrosis. Subcentimeter simple right renal cyst. No suspicious renal masses. Stomach/Bowel: There is irregular annular wall thickening and hyperenhancement at the esophagogastric junction. Probable small hiatal hernia. Otherwise grossly normal stomach. Visualized small and large bowel is normal caliber, with no bowel wall thickening. Vascular/Lymphatic: Atherosclerotic nonaneurysmal abdominal aorta. Patent portal, splenic, hepatic and renal veins. There is a heterogeneously enhancing 5.7 x 4.4 cm mass in the gastrohepatic ligament (series 19/image 20), which is intimately associated with the celiac trunk and bifurcation and is favored to represent conglomerate adenopathy. No additional pathologically enlarged lymph nodes in the abdomen. Other: No abdominal ascites or focal fluid collection. Musculoskeletal: No aggressive appearing focal osseous lesions. IMPRESSION: 1. Irregular annular wall thickening and hyperenhancement at the esophagogastric junction, worrisome for malignancy. Upper endoscopic correlation advised. 2. Bulky confluent adenopathy in the gastrohepatic ligament worrisome for nodal metastases. 3. Two enhancing right inferior liver lobe masses, largest 2.3 cm in segment 5 with irregular margins, worrisome for liver metastases. The dominant liver mass should be amenable to percutaneous biopsy as clinically warranted. 4. Indeterminate small left adrenal nodule, cannot exclude left adrenal metastasis. 5. **An incidental finding of potential clinical significance has been found. 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 A follow-up MRI abdomen without and with IV contrast is  recommended in 12 months. This recommendation follows ACR consensus guidelines: Managing Incidental Findings on Abdominal CT: White Paper of the ACR Incidental Findings Committee. J Am Coll Radiol 2010;7:754-773.** 6. Diffuse hepatic iron deposition, probably transfusion related. 7. Atherosclerotic nonaneurysmal abdominal aorta. Electronically Signed   By: Ilona Sorrel M.D.   On: 09/25/2015 15:32   US Abdomen Limited Ruq  Result Date: 09/08/2015 CLINICAL DATA:  Two months of intermittent nausea EXAM: US ABDOMEN LIMITED - RIGHT UPPER QUADRANT COMPARISON:  None in PACs FINDINGS: Gallbladder: No gallstones or wall thickening visualized. No sonographic Murphy sign noted by sonographer. Common bile duct: Diameter: 3.5 mm Liver: The hepatic echotexture is mildly increased diffusely. In the right hepatic lobe there is a hypoechoic focus measuring 2.1 x 1.8 x 2 cm. It does not appear hypervascular. There is no intrahepatic ductal dilation. The surface contour of the liver is normal. IMPRESSION: 1. Abnormal hypoechoic solid-appearing focus in the right hepatic lobe measuring up to 2.1 cm. This may reflect an inflammatory or neoplastic process.  A non fat containing hemangioma could produce a similar appearance. Hepatic protocol MRI is recommended. 2. Normal appearance of the gallbladder and common bile duct. Electronically Signed   By: David  Martinique M.D.   On: 09/08/2015 09:57   ASSESSMENT:  Iron deficiency anemia, GE junction thickening, liver metastasis.  PLAN:    1. GE junction thickening, liver metastasis: Highly concerning for underlying malignancy. This is also likely contributing to his weight loss. Have ordered a PET scan for further evaluation. Referral has been sent to GI. We will also get an ultrasound-guided liver biopsy for possible diagnosis as well as staging purposes. Finally, patient has been referred for consideration of EUS. Return to clinic in 1-2 weeks after his biopsy to discuss the results  and treatment planning if necessary. 2. Iron deficiency anemia: Patient's hemoglobin and iron stores have trended down. He will require IV iron in the future, but will hold treatment until after workup for his possible malignancy is completed.,  3. Recent MI: Treatment per cardiology. 4. Weight loss: Likely secondary to underlying malignancy, monitor.  Approximately 30 minutes was spent in discussion of which greater than 50% was consultation.  Patient expressed understanding and was in agreement with this plan. He also understands that He can call clinic at any time with any questions, concerns, or complaints.    Lloyd Huger, MD   09/29/2015 1:22 PM

## 2015-10-07 NOTE — Anesthesia Postprocedure Evaluation (Signed)
Anesthesia Post Note  Patient: [name]  Procedure(s) Performed: Procedure(s) (LRB): FULL UPPER ENDOSCOPIC ULTRASOUND (EUS) RADIAL (N/A)  Patient location during evaluation: PACU Anesthesia Type: General Level of consciousness: awake and alert and oriented Pain management: pain level controlled Vital Signs Assessment: post-procedure vital signs reviewed and stable Respiratory status: spontaneous breathing Cardiovascular status: blood pressure returned to baseline Anesthetic complications: no    Last Vitals:  Vitals:   10/07/15 1403 10/07/15 1413  BP: 139/66 132/66  Pulse: 73 72  Resp: 15 (!) 21  Temp:      Last Pain:  Vitals:   10/07/15 1343  TempSrc: Tympanic                 Meshell Abdulaziz

## 2015-10-07 NOTE — Op Note (Signed)
[facility] Gastroenterology Patient Name: [name] Procedure Date: 10/07/2015 12:54 PM MRN: [id number] Account #: [id number] Date of Birth: 04/23/1940 Admit Type: Outpatient Age: 75 Room: [facility] ENDO ROOM 3 Gender: Male Note Status: Finalized Procedure:            Upper EUS Indications:          Abnormal abdominal MRI, Weight loss, Unexplained iron                        deficiency anemia Patient Profile:      Refer to note in patient chart for documentation of                        history and physical. Providers:            Murray Hodgkins. [place] Referring MD:         Arnetha Courser (Referring MD), Kathlene November. Grayland Ormond, MD                        (Referring MD) Medicines:            Propofol per Anesthesia Complications:        No immediate complications. Procedure:            Pre-Anesthesia Assessment:                       Prior to the procedure, a History and Physical was                        performed, and patient medications and allergies were                        reviewed. The patient is competent. The risks and                        benefits of the procedure and the sedation options and                        risks were discussed with the patient. All questions                        were answered and informed consent was obtained.                        Patient identification and proposed procedure were                        verified by the physician, the nurse and the                        anesthetist in the pre-procedure area. Mental Status                        Examination: alert and oriented. Airway Examination:                        normal oropharyngeal airway and neck mobility.                        Respiratory Examination: clear to auscultation. CV  Examination: normal. Prophylactic Antibiotics: The                        patient does not require prophylactic antibiotics.                        Prior  Anticoagulants: The patient has taken no previous                        anticoagulant or antiplatelet agents. ASA Grade                        Assessment: III - A patient with severe systemic                        disease. After reviewing the risks and benefits, the                        patient was deemed in satisfactory condition to undergo                        the procedure. The anesthesia plan was to use monitored                        anesthesia care (MAC). Immediately prior to                        administration of medications, the patient was                        re-assessed for adequacy to receive sedatives. The                        heart rate, respiratory rate, oxygen saturations, blood                        pressure, adequacy of pulmonary ventilation, and                        response to care were monitored throughout the                        procedure. The physical status of the patient was                        re-assessed after the procedure.                       After obtaining informed consent, the endoscope was                        passed under direct vision. Throughout the procedure,                        the patient's blood pressure, pulse, and oxygen                        saturations were monitored continuously. The EUS GI                        Linear Array [id number]  was introduced through the mouth,                        and advanced to the stomach for ultrasound examination                        from the esophagus and stomach. The Endoscope was                        introduced through the mouth, and advanced to the                        second part of duodenum. The upper EUS was accomplished                        without difficulty. The patient tolerated the procedure                        well. Findings:      Endoscopic Finding :      A large, fungating mass was found in the middle third of the esophagus       and in the lower third of the  esophagus, 31 to 37 cm from the incisors.       The mass was partially obstructing and circumferential. Biopsies were       taken with a cold forceps for histology.      The entire examined stomach was endoscopically normal.      The examined duodenum was endoscopically normal.      Endosonographic Finding :      A few round lesions were identified endosonographically in the left lobe       of the liver. The endosonographic appearance was suggestive of       metastases. The lesions were hypoechoic. The largest lesion measured 10       mm by 8 mm in maximal cross-sectional diameter. The endosonographic       borders were well-defined. Fine needle biopsy was performed. Color       Doppler imaging was utilized prior to needle puncture to confirm a lack       of significant vascular structures within the needle path. Two passes       were made with the 25 gauge Cook Procore ultrasound biopsy needle using       a transgastric approach. The cellularity of the specimen was adequate.       Final cytology results are pending.      Multiple malignant-appearing lymph nodes were visualized in the       gastrohepatic ligament (level 18) and perigastric region. The largest       measured 24 mm by 24 mm in maximal cross-sectional diameter. The nodes       were round, hypoechoic and had well defined margins.      Pancreatic parenchymal abnormalities were noted in the genu and body of       the pancreas. These consisted of hyperechoic strands and lobularity. Impression:           EGD Impressions:                       - Partially obstructing large mass was found in the  middle/lower thirds of the esophagus. Biopsied.                        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                       - Normal stomach.                       - Normal examined duodenum.                       EUS Impressions:                       - A few  metastatic appearing lesions were found in the                        left lobe of the liver. The endosonographic appearance                        is highly suspicious for metastatic esophageal                        adenocarcinoma. Fine needle biopsy was performed on a                        representative lesion.                       - Multiple large malignant-appearing lymph nodes were                        visualized in the gastrohepatic ligament (level 18) and                        perigastric region. Tissue has not been obtained.                        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                       - Pancreatic parenchymal abnormalities consisting of                        hyperechoic strands and lobularity were noted in the                        genu of the pancreas and in the pancreatic body.                       - EUS "T" staging of the esophageal mass was not                        performed as it is unlikely to change management given  likely metastatic disease to the liver and lymph nodes. Recommendation:       - Discharge patient to home (ambulatory).                       - Await cytology and pathology results.                       - The findings and recommendations were discussed with                        the patient and his family.                       - Return to referring medical oncologist, Dr. Grayland Ormond,                        as previously scheduled. Further plan of care to be                        determined by Dr. Grayland Ormond. Procedure Code(s):    --- Professional ---                       [phone], Esophagogastroduodenoscopy, flexible, transoral;                        with transendoscopic ultrasound-guided intramural or                        transmural fine needle aspiration/biopsy(s), (includes                        endoscopic ultrasound  examination limited to the                        esophagus, stomach or duodenum, and adjacent structures)                       43239, 59, Esophagogastroduodenoscopy, flexible,                        transoral; with biopsy, single or multiple Diagnosis Code(s):    --- Professional ---                       R93.5, Abnormal findings on diagnostic imaging of other                        abdominal regions, including retroperitoneum                       R63.4, Abnormal weight loss                       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 All rights reserved. The codes documented in this report are preliminary and upon coder review may  be revised to meet current compliance requirements. Attending Participation:      I personally performed the entire procedure. [place],  10/07/2015 1:47:18 PM This report has been signed electronically. Number of Addenda: 0 Note Initiated On: 10/07/2015 12:54 PM Estimated Blood Loss: Estimated blood loss: none.      [facility]

## 2015-10-07 NOTE — Interval H&P Note (Signed)
History and Physical Interval Note:  10/07/2015 12:51 PM  [name]  has presented today for surgery, with the diagnosis of esophagogastric junction wall thickening  The various methods of treatment have been discussed with the patient and family. After consideration of risks, benefits and other options for treatment, the patient has consented to  Procedure(s): FULL UPPER ENDOSCOPIC ULTRASOUND (EUS) RADIAL (N/A) as a surgical intervention .  The patient's history has been reviewed, patient examined, no change in status, stable for surgery.  I have reviewed the patient's chart and labs.  Questions were answered to the patient's satisfaction.     [name]

## 2015-10-07 NOTE — Transfer of Care (Signed)
Immediate Anesthesia Transfer of Care Note  Patient: [name]  Procedure(s) Performed: Procedure(s): FULL UPPER ENDOSCOPIC ULTRASOUND (EUS) RADIAL (N/A)  Patient Location: Endoscopy Unit  Anesthesia Type:General  Level of Consciousness: awake  Airway & Oxygen Therapy: Patient Spontanous Breathing and Patient connected to nasal cannula oxygen  Post-op Assessment: Report given to RN and Post -op Vital signs reviewed and stable  Post vital signs: Reviewed  Last Vitals:  Vitals:   10/07/15 1241 10/07/15 1343  BP: 133/67 129/68  Pulse: 76 75  Resp: 18 12  Temp: 36.4 C 36.2 C    Last Pain:  Vitals:   10/07/15 1241  TempSrc: Tympanic         Complications: No apparent anesthesia complications

## 2015-10-08 ENCOUNTER — Ambulatory Visit: Payer: Medicare Other

## 2015-10-08 ENCOUNTER — Encounter: Payer: Self-pay | Admitting: Internal Medicine

## 2015-10-08 ENCOUNTER — Inpatient Hospital Stay: Payer: Medicare Other

## 2015-10-08 LAB — CYTOLOGY - NON PAP

## 2015-10-11 ENCOUNTER — Ambulatory Visit: Payer: Medicare Other

## 2015-10-11 ENCOUNTER — Inpatient Hospital Stay: Payer: Medicare Other

## 2015-10-11 ENCOUNTER — Ambulatory Visit
Admission: RE | Admit: 2015-10-11 | Discharge: 2015-10-11 | Disposition: A | Discharge status: Home / Self Care | Payer: Medicare Other | Source: Ambulatory Visit | Attending: Oncology | Admitting: Oncology

## 2015-10-11 DIAGNOSIS — C787 Secondary malignant neoplasm of liver and intrahepatic bile duct: Secondary | ICD-10-CM | POA: Diagnosis not present

## 2015-10-11 DIAGNOSIS — I700 Atherosclerosis of aorta: Secondary | ICD-10-CM | POA: Insufficient documentation

## 2015-10-11 DIAGNOSIS — R938 Abnormal findings on diagnostic imaging of other specified body structures: Secondary | ICD-10-CM | POA: Insufficient documentation

## 2015-10-11 DIAGNOSIS — I2510 Atherosclerotic heart disease of native coronary artery without angina pectoris: Secondary | ICD-10-CM | POA: Insufficient documentation

## 2015-10-11 DIAGNOSIS — C7889 Secondary malignant neoplasm of other digestive organs: Secondary | ICD-10-CM | POA: Diagnosis not present

## 2015-10-11 DIAGNOSIS — C155 Malignant neoplasm of lower third of esophagus: Secondary | ICD-10-CM | POA: Diagnosis not present

## 2015-10-11 LAB — GLUCOSE, CAPILLARY: Glucose-Capillary: 103 mg/dL — ABNORMAL HIGH (ref 65–99)

## 2015-10-11 MED ORDER — FLUDEOXYGLUCOSE F - 18 (FDG) INJECTION
12.0000 | Freq: Once | INTRAVENOUS | Status: AC | PRN
  Administered 2015-10-11: 13.383 via INTRAVENOUS

## 2015-10-12 ENCOUNTER — Inpatient Hospital Stay: Payer: Medicare Other | Admitting: *Deleted

## 2015-10-12 DIAGNOSIS — K2289 Other specified disease of esophagus: Secondary | ICD-10-CM | POA: Insufficient documentation

## 2015-10-12 DIAGNOSIS — C801 Malignant (primary) neoplasm, unspecified: Secondary | ICD-10-CM | POA: Diagnosis not present

## 2015-10-12 DIAGNOSIS — R933 Abnormal findings on diagnostic imaging of other parts of digestive tract: Secondary | ICD-10-CM | POA: Diagnosis not present

## 2015-10-12 DIAGNOSIS — K228 Other specified diseases of esophagus: Secondary | ICD-10-CM | POA: Insufficient documentation

## 2015-10-12 DIAGNOSIS — R970 Elevated carcinoembryonic antigen [CEA]: Secondary | ICD-10-CM | POA: Diagnosis not present

## 2015-10-12 DIAGNOSIS — C787 Secondary malignant neoplasm of liver and intrahepatic bile duct: Secondary | ICD-10-CM | POA: Diagnosis not present

## 2015-10-12 DIAGNOSIS — D509 Iron deficiency anemia, unspecified: Secondary | ICD-10-CM | POA: Diagnosis not present

## 2015-10-12 DIAGNOSIS — R112 Nausea with vomiting, unspecified: Secondary | ICD-10-CM | POA: Diagnosis not present

## 2015-10-13 LAB — AFP TUMOR MARKER: AFP-Tumor Marker: 1.6 ng/mL (ref 0.0–8.3)

## 2015-10-13 LAB — CANCER ANTIGEN 19-9: CA 19-9: 65038 U/mL — ABNORMAL HIGH (ref 0–35)

## 2015-10-13 LAB — CEA: CEA: 588.1 ng/mL — AB (ref 0.0–4.7)

## 2015-10-14 ENCOUNTER — Ambulatory Visit: Payer: Medicare Other | Admitting: Gastroenterology

## 2015-10-18 DIAGNOSIS — C155 Malignant neoplasm of lower third of esophagus: Secondary | ICD-10-CM | POA: Insufficient documentation

## 2015-10-18 NOTE — Progress Notes (Signed)
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  INTERVAL HISTORY:  Patient returns to clinic today to discuss his pathology and imaging results and treatment planning. He continues to have persistent vomiting and weight loss. He has significant weakness and fatigue. He has no neurologic complaints. He denies any fevers. He denies any chest pain or shortness of breath. He denies any nausea, vomiting, consultation, or diarrhea. He denies any melena or hematochezia. Patient offers no further specific complaints.  REVIEW OF SYSTEMS:   Review of Systems  Constitutional: Positive for malaise/fatigue and weight loss. Negative for fever.  Respiratory: Negative.  Negative for cough and shortness of breath.   Cardiovascular: Negative.  Negative for chest pain.  Gastrointestinal: Positive for vomiting. Negative for abdominal pain, blood in stool and melena.  Musculoskeletal: Negative.   Skin: Negative.   Neurological: Positive for weakness.  Psychiatric/Behavioral: Negative.     As per HPI. Otherwise, a complete review of systems is negatve.  PAST MEDICAL HISTORY: Past Medical History:  Diagnosis Date  . Arthritis   . 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 Diabetes mellitus without complication ([place])   . Ectatic abdominal aorta ([place]) 08/12/2015  . Hearing loss    left ear  . Hypertension   . Hypothyroidism   . Mild left atrial enlargement 08/12/2015   Very mild; LA 4.2 cm; echo March 2017  . Mitral valvular  regurgitation 08/12/2015  . Sleep apnea   . Stroke ([place])   . TIA (transient ischemic attack)   . 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 APPENDECTOMY    . EUS N/A 10/07/2015   Procedure: FULL UPPER ENDOSCOPIC ULTRASOUND (EUS) RADIAL;  Surgeon: Holly Bodily, MD;  Location: [facility] ENDOSCOPY;  Service: Endoscopy;  Laterality: N/A;  . EYE SURGERY    . FRACTURE SURGERY    . NOSE SURGERY      FAMILY HISTORY Family History  Problem Relation Age of Onset  . Hypertension Mother   . Lung cancer Father   . Hypertension Brother        ADVANCED DIRECTIVES:    HEALTH MAINTENANCE: Social History  Substance Use Topics  . Smoking status: Former Smoker    Packs/day: 1.00    Types: Cigarettes    Quit date: 10/03/1983  . Smokeless tobacco: Never Used  . Alcohol use 0.0 oz/week     Comment: rarely     Colonoscopy:  PAP:  Bone density:  Lipid panel:  Allergies  Allergen Reactions  . Sulfa Antibiotics Itching and Hives  . Amoxicillin Hives  . Ampicillin Hives  . Keflex [Cephalexin] Itching  . Lisinopril Other (See Comments)  . Losartan Other (See Comments)  . Sulfamethoxazole-Trimethoprim Other (See Comments)  . Cephalosporins Hives    Current Outpatient Prescriptions  Medication Sig Dispense Refill  . aspirin EC 81 MG tablet Take 162 mg by mouth 2 (two) times daily.    . Biotin (BIOTIN 5000) 5 MG CAPS Take by mouth.    Marland Kitchen  Cholecalciferol (VITAMIN D3) 1000 UNITS CAPS Take 2,000 Units by mouth daily.    . Coenzyme Q10 (CO Q-10) 200 MG CAPS Take by mouth.    Marland Kitchen FREESTYLE LITE test strip USE 1 STRIP BID  3  . isosorbide mononitrate (IMDUR) 60 MG 24 hr tablet Take 1 tablet (60 mg total) by mouth daily. 30 tablet 0  . levothyroxine (SYNTHROID, LEVOTHROID) 100 MCG tablet Take 100 mcg by mouth every morning.    . Lutein 40 MG CAPS Take by mouth.    . Magnesium Oxide 250 MG TABS Take  1 tablet (250 mg total) by mouth daily.  0  . metFORMIN (GLUCOPHAGE) 1000 MG tablet Take 2,000 mg by mouth daily.    . metoprolol tartrate (LOPRESSOR) 25 MG tablet Take 12.5 mg by mouth 2 (two) times daily.     . Multiple Vitamins-Minerals (MULTI FOR HIM 50+ PO) Take 1 tablet by mouth daily.    . Omega-3 Fatty Acids (FISH OIL) 1000 MG CAPS Take 1 capsule by mouth daily.    . Red Yeast Rice 600 MG TABS Take 2 tablets by mouth at bedtime.    . lidocaine-prilocaine (EMLA) cream Apply 1 application topically as needed. Apply to port 1-2 hours prior to chemotherapy. Cover with plastic wrap. 30 g 2  . ondansetron (ZOFRAN) 8 MG tablet Take 1 tablet (8 mg total) by mouth every 8 (eight) hours as needed for nausea or vomiting. 30 tablet 2  . prochlorperazine (COMPAZINE) 10 MG tablet Take 1 tablet (10 mg total) by mouth every 6 (six) hours as needed for nausea or vomiting. 30 tablet 2   No current facility-administered medications for this visit.     OBJECTIVE: Vitals:   10/19/15 1141  BP: 134/64  Pulse: 75  Temp: 98 F (36.7 C)     Body mass index is 22.03 kg/m.    ECOG FS:1 - Symptomatic but completely ambulatory  General: Well-developed, well-nourished, no acute distress. Eyes: Pink conjunctiva, anicteric sclera. Lungs: Clear to auscultation bilaterally. Heart: Regular rate and rhythm. No rubs, murmurs, or gallops. Abdomen: Soft, nontender, nondistended. No organomegaly noted, normoactive bowel sounds. Musculoskeletal: No edema, cyanosis, or clubbing. Neuro: Alert, answering all questions appropriately. Cranial nerves grossly intact. Skin: No rashes or petechiae noted. Psych: Normal affect.   LAB RESULTS:  Lab Results  Component Value Date   NA 133 (L) 09/17/2015   K 4.7 09/17/2015   CL 96 (L) 09/17/2015   CO2 27 09/17/2015   GLUCOSE 96 09/17/2015   BUN 15 09/17/2015   CREATININE 0.67 09/17/2015   CALCIUM 9.4 09/17/2015   PROT 7.2 09/13/2015   ALBUMIN 4.4 09/13/2015   AST 15  09/13/2015   ALT 11 09/13/2015   ALKPHOS 53 09/13/2015   BILITOT 0.3 09/13/2015   GFRNONAA >60 09/17/2015   GFRAA >60 09/17/2015    Lab Results  Component Value Date   WBC 12.1 (H) 09/29/2015   NEUTROABS 9.5 (H) 09/29/2015   HGB 10.5 (L) 09/29/2015   HCT 31.1 (L) 09/29/2015   MCV 83.0 09/29/2015   PLT 688 (H) 09/29/2015   Lab Results  Component Value Date   IRON 30 (L) 09/29/2015   TIBC 355 09/29/2015   IRONPCTSAT 9 (L) 09/29/2015    Lab Results  Component Value Date   FERRITIN 72 09/29/2015     STUDIES: Mr Liver W Wo Contrast  Result Date: 09/25/2015 CLINICAL DATA:  Indeterminate right liver lobe mass on recent abdominal sonogram performed in the setting of nausea.  EXAM: MRI ABDOMEN WITHOUT AND WITH CONTRAST TECHNIQUE: Multiplanar multisequence MR imaging of the abdomen was performed both before and after the administration of intravenous contrast. CONTRAST:  9m MULTIHANCE GADOBENATE DIMEGLUMINE 529 MG/ML IV SOLN COMPARISON:  09/08/2015 abdominal sonogram. FINDINGS: Lower chest: Clear lung bases. Hepatobiliary: Normal liver size and configuration. There is signal loss throughout the liver on inphase chemical shift imaging, consistent with hepatic iron deposition. There is a solitary 2.3 x 1.9 cm segment 5 right liver lobe mass (series 13/ image 34), which demonstrates mild T2 hyperintensity, restricted diffusion, irregular margins and heterogeneous hyperenhancement. There is a separate 0.7 cm hyperenhancing liver mass at the far anterior inferior right liver margin (series 19/ image 61) with similar imaging features. No additional liver mass. Normal gallbladder with no cholelithiasis. No biliary ductal dilatation. Common bile duct diameter 4 mm. No choledocholithiasis. Pancreas: No main pancreatic duct dilation. No pancreas divisum. There are several scattered unilocular lobulated nonenhancing cystic lesions throughout the pancreas (at least 5), largest 1.1 x 0.6 x 1.3 cm in the  posterior pancreatic body (series 8/image 11). Spleen: Normal size. No mass. Adrenals/Urinary Tract: Normal right adrenal. Indeterminate 1.1 cm enhancing left adrenal nodule (series 23/ image 30), without appreciable signal loss on out of phase chemical shift imaging. No hydronephrosis. Subcentimeter simple right renal cyst. No suspicious renal masses. Stomach/Bowel: There is irregular annular wall thickening and hyperenhancement at the esophagogastric junction. Probable small hiatal hernia. Otherwise grossly normal stomach. Visualized small and large bowel is normal caliber, with no bowel wall thickening. Vascular/Lymphatic: Atherosclerotic nonaneurysmal abdominal aorta. Patent portal, splenic, hepatic and renal veins. There is a heterogeneously enhancing 5.7 x 4.4 cm mass in the gastrohepatic ligament (series 19/image 20), which is intimately associated with the celiac trunk and bifurcation and is favored to represent conglomerate adenopathy. No additional pathologically enlarged lymph nodes in the abdomen. Other: No abdominal ascites or focal fluid collection. Musculoskeletal: No aggressive appearing focal osseous lesions. IMPRESSION: 1. Irregular annular wall thickening and hyperenhancement at the esophagogastric junction, worrisome for malignancy. Upper endoscopic correlation advised. 2. Bulky confluent adenopathy in the gastrohepatic ligament worrisome for nodal metastases. 3. Two enhancing right inferior liver lobe masses, largest 2.3 cm in segment 5 with irregular margins, worrisome for liver metastases. The dominant liver mass should be amenable to percutaneous biopsy as clinically warranted. 4. Indeterminate small left adrenal nodule, cannot exclude left adrenal metastasis. 5. **An incidental finding of potential clinical significance has been found. 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 A follow-up MRI abdomen without and with IV contrast is recommended in 12 months. This recommendation follows ACR consensus guidelines: Managing Incidental Findings on Abdominal CT: White Paper of the ACR Incidental Findings Committee. J Am Coll Radiol 2010;7:754-773.** 6. Diffuse hepatic iron deposition, probably transfusion related. 7. Atherosclerotic nonaneurysmal abdominal aorta. Electronically Signed   By: JIlona SorrelM.D.   On: 09/25/2015 15:32   Nm Pet Image Initial (pi) Skull Base To Thigh  Result Date: 10/11/2015 CLINICAL DATA:  Initial treatment strategy for esophageal thickening with metastases. EXAM: NUCLEAR MEDICINE PET SKULL BASE TO THIGH TECHNIQUE: 13.4 mCi F-18 FDG was injected intravenously. Full-ring PET imaging was performed from the skull base to thigh after the radiotracer. CT data was obtained and used for attenuation correction and anatomic localization. FASTING BLOOD GLUCOSE:  Value: 103 mg/dl COMPARISON:  MR abdomen 09/25/2015 and CT chest 10/04/2014. FINDINGS: NECK There is asymmetric hypermetabolism in the  right occipital lobe, nonspecific. No hypermetabolic lymph nodes in the neck. CT images show no acute findings. CHEST Hypermetabolic distal esophageal mass has an SUV max of 17.1. Hypermetabolic mediastinal and subcarinal lymph nodes measure up to 1.8 cm in the subcarinal station, with an SUV max of 8.6. Multiple tiny hypermetabolic pulmonary nodules are seen with an index right perihilar nodule measuring 9 mm (image 105) with an SUV max of 7.1. Atherosclerotic calcification of the aorta and coronary arteries. No pericardial or pleural effusion. ABDOMEN/PELVIS There are 2 areas of abnormal hypermetabolism in the liver. Both are seen peripherally in the right hepatic lobe with an index ill-defined low-attenuation lesion inferiorly, measuring 2.0 cm with an SUV max of 9.7. 1.8 cm left adrenal nodule has an SUV max of 8.3. Conglomerate gastrohepatic ligament adenopathy  measures approximately 4.8 x 6.3 cm with an SUV max of 9.2. Mildly hypermetabolic lymph node/nodule inferior to the stomach and along the posterior margin of the pancreatic tail measures 12 mm (CT image 139) with an SUV max of 4.9. A left inguinal hernia contains hypermetabolic soft tissue, measuring approximately 12 mm with an SUV max of 4.9. Liver, gallbladder, right adrenal gland, kidneys, spleen, pancreas, stomach and bowel are otherwise grossly unremarkable. Aortic atherosclerosis. SKELETON Focal hypermetabolism is seen within the inferior right scapula, SUV max 4.0, without a definite CT correlate. Abnormal hypermetabolism is seen in association with the right proximal posterior thigh musculature, with an SUV max of 4.0, corresponding to a 2.4 cm intramuscular low-attenuation lesion (CT image 272). IMPRESSION: 1. Distal esophageal carcinoma with metastatic disease involving the mediastinum, lungs, liver, left adrenal gland, upper abdominal lymph nodes, posterior right thigh musculature and possibly right scapula. 2. Hypermetabolic soft tissue within the left inguinal canal, possibly metastatic. 3. Mild asymmetric hypermetabolism in the right occipital lobe, nonspecific. Metastatic disease cannot be excluded. 4. Aortic atherosclerosis and coronary artery calcification. Electronically Signed   By: Lorin Picket M.D.   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  PLAN:    1. 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 Biopsy confirmed adenocarcinoma consistent with esophageal origin. After lengthy discussion with the patient and his wife, he expressed desire to undergo palliative chemotherapy using FOLFOX. HER-2 testing has been ordered and is pending at time of dictation. Patient will require port placement as well as MUGA scan prior to  initiating treatment. Return to clinic on October 27, 2015 to initiate cycle 1 of 6. Will reimage after 6 cycles. 2. Iron deficiency anemia: Patient's hemoglobin and iron stores have trended down. He will require IV iron in the future.  3. Recent MI: Treatment per cardiology. 4. Weight loss: Likely secondary to underlying malignancy, monitor.  Approximately 30 minutes was spent in discussion of which greater than 50% was consultation.  Patient expressed understanding and was in agreement with this plan. He also understands that He can call clinic at any time with any questions, concerns, or complaints.    Lloyd Huger, MD   10/21/2015 1:43 PM

## 2015-10-19 ENCOUNTER — Inpatient Hospital Stay: Payer: Medicare Other | Attending: Oncology | Admitting: Oncology

## 2015-10-19 ENCOUNTER — Encounter: Payer: Self-pay | Admitting: Oncology

## 2015-10-19 ENCOUNTER — Inpatient Hospital Stay: Payer: Medicare Other | Admitting: Oncology

## 2015-10-19 VITALS — BP 134/64 | HR 75 | Temp 98.0°F | Wt 140.7 lb

## 2015-10-19 DIAGNOSIS — M5134 Other intervertebral disc degeneration, thoracic region: Secondary | ICD-10-CM | POA: Diagnosis not present

## 2015-10-19 DIAGNOSIS — I340 Nonrheumatic mitral (valve) insufficiency: Secondary | ICD-10-CM

## 2015-10-19 DIAGNOSIS — Q2546 Tortuous aortic arch: Secondary | ICD-10-CM | POA: Insufficient documentation

## 2015-10-19 DIAGNOSIS — Z7984 Long term (current) use of oral hypoglycemic drugs: Secondary | ICD-10-CM | POA: Diagnosis not present

## 2015-10-19 DIAGNOSIS — I77811 Abdominal aortic ectasia: Secondary | ICD-10-CM | POA: Diagnosis not present

## 2015-10-19 DIAGNOSIS — I252 Old myocardial infarction: Secondary | ICD-10-CM | POA: Diagnosis not present

## 2015-10-19 DIAGNOSIS — R413 Other amnesia: Secondary | ICD-10-CM | POA: Insufficient documentation

## 2015-10-19 DIAGNOSIS — C155 Malignant neoplasm of lower third of esophagus: Secondary | ICD-10-CM | POA: Diagnosis not present

## 2015-10-19 DIAGNOSIS — R1310 Dysphagia, unspecified: Secondary | ICD-10-CM | POA: Insufficient documentation

## 2015-10-19 DIAGNOSIS — I351 Nonrheumatic aortic (valve) insufficiency: Secondary | ICD-10-CM | POA: Insufficient documentation

## 2015-10-19 DIAGNOSIS — Z5111 Encounter for antineoplastic chemotherapy: Secondary | ICD-10-CM | POA: Insufficient documentation

## 2015-10-19 DIAGNOSIS — E039 Hypothyroidism, unspecified: Secondary | ICD-10-CM | POA: Diagnosis not present

## 2015-10-19 DIAGNOSIS — R112 Nausea with vomiting, unspecified: Secondary | ICD-10-CM | POA: Diagnosis not present

## 2015-10-19 DIAGNOSIS — I639 Cerebral infarction, unspecified: Secondary | ICD-10-CM | POA: Insufficient documentation

## 2015-10-19 DIAGNOSIS — M1990 Unspecified osteoarthritis, unspecified site: Secondary | ICD-10-CM | POA: Insufficient documentation

## 2015-10-19 DIAGNOSIS — Z8673 Personal history of transient ischemic attack (TIA), and cerebral infarction without residual deficits: Secondary | ICD-10-CM | POA: Insufficient documentation

## 2015-10-19 DIAGNOSIS — I10 Essential (primary) hypertension: Secondary | ICD-10-CM | POA: Insufficient documentation

## 2015-10-19 DIAGNOSIS — Z801 Family history of malignant neoplasm of trachea, bronchus and lung: Secondary | ICD-10-CM

## 2015-10-19 DIAGNOSIS — E119 Type 2 diabetes mellitus without complications: Secondary | ICD-10-CM

## 2015-10-19 DIAGNOSIS — D509 Iron deficiency anemia, unspecified: Secondary | ICD-10-CM | POA: Insufficient documentation

## 2015-10-19 DIAGNOSIS — R634 Abnormal weight loss: Secondary | ICD-10-CM

## 2015-10-19 DIAGNOSIS — I4510 Unspecified right bundle-branch block: Secondary | ICD-10-CM | POA: Insufficient documentation

## 2015-10-19 DIAGNOSIS — R5383 Other fatigue: Secondary | ICD-10-CM | POA: Diagnosis not present

## 2015-10-19 DIAGNOSIS — Z79899 Other long term (current) drug therapy: Secondary | ICD-10-CM | POA: Diagnosis not present

## 2015-10-19 DIAGNOSIS — Z87891 Personal history of nicotine dependence: Secondary | ICD-10-CM | POA: Diagnosis not present

## 2015-10-19 DIAGNOSIS — C787 Secondary malignant neoplasm of liver and intrahepatic bile duct: Secondary | ICD-10-CM | POA: Diagnosis not present

## 2015-10-19 DIAGNOSIS — Z7982 Long term (current) use of aspirin: Secondary | ICD-10-CM | POA: Diagnosis not present

## 2015-10-19 DIAGNOSIS — G4730 Sleep apnea, unspecified: Secondary | ICD-10-CM | POA: Insufficient documentation

## 2015-10-19 DIAGNOSIS — R531 Weakness: Secondary | ICD-10-CM | POA: Diagnosis not present

## 2015-10-19 DIAGNOSIS — M129 Arthropathy, unspecified: Secondary | ICD-10-CM | POA: Insufficient documentation

## 2015-10-19 DIAGNOSIS — E785 Hyperlipidemia, unspecified: Secondary | ICD-10-CM | POA: Insufficient documentation

## 2015-10-19 MED ORDER — LIDOCAINE-PRILOCAINE 2.5-2.5 % EX CREA: 1.0000 "application " | TOPICAL_CREAM | CUTANEOUS | 2 refills | Status: AC | PRN

## 2015-10-19 MED ORDER — ONDANSETRON HCL 8 MG PO TABS: 8.0000 mg | ORAL_TABLET | Freq: Three times a day (TID) | ORAL | 2 refills | Status: DC | PRN

## 2015-10-19 MED ORDER — PROCHLORPERAZINE MALEATE 10 MG PO TABS: 10.0000 mg | ORAL_TABLET | Freq: Four times a day (QID) | ORAL | 2 refills | Status: AC | PRN

## 2015-10-20 NOTE — Patient Instructions (Signed)
Oxaliplatin Injection What is this medicine? OXALIPLATIN (ox AL i PLA tin) is a chemotherapy drug. It targets fast dividing cells, like cancer cells, and causes these cells to die. This medicine is used to treat cancers of the colon and rectum, and many other cancers. This medicine may be used for other purposes; ask your health care provider or pharmacist if you have questions. What should I tell my health care provider before I take this medicine? They need to know if you have any of these conditions: -kidney disease -an unusual or allergic reaction to oxaliplatin, other chemotherapy, other medicines, foods, dyes, or preservatives -pregnant or trying to get pregnant -breast-feeding How should I use this medicine? This drug is given as an infusion into a vein. It is administered in a hospital or clinic by a specially trained health care professional. Talk to your pediatrician regarding the use of this medicine in children. Special care may be needed. Overdosage: If you think you have taken too much of this medicine contact a poison control center or emergency room at once. NOTE: This medicine is only for you. Do not share this medicine with others. What if I miss a dose? It is important not to miss a dose. Call your doctor or health care professional if you are unable to keep an appointment. What may interact with this medicine? -medicines to increase blood counts like filgrastim, pegfilgrastim, sargramostim -probenecid -some antibiotics like amikacin, gentamicin, neomycin, polymyxin B, streptomycin, tobramycin -zalcitabine Talk to your doctor or health care professional before taking any of these medicines: -acetaminophen -aspirin -ibuprofen -ketoprofen -naproxen This list may not describe all possible interactions. Give your health care provider a list of all the medicines, herbs, non-prescription drugs, or dietary supplements you use. Also tell them if you smoke, drink alcohol, or use  illegal drugs. Some items may interact with your medicine. What should I watch for while using this medicine? Your condition will be monitored carefully while you are receiving this medicine. You will need important blood work done while you are taking this medicine. This medicine can make you more sensitive to cold. Do not drink cold drinks or use ice. Cover exposed skin before coming in contact with cold temperatures or cold objects. When out in cold weather wear warm clothing and cover your mouth and nose to warm the air that goes into your lungs. Tell your doctor if you get sensitive to the cold. This drug may make you feel generally unwell. This is not uncommon, as chemotherapy can affect healthy cells as well as cancer cells. Report any side effects. Continue your course of treatment even though you feel ill unless your doctor tells you to stop. In some cases, you may be given additional medicines to help with side effects. Follow all directions for their use. Call your doctor or health care professional for advice if you get a fever, chills or sore throat, or other symptoms of a cold or flu. Do not treat yourself. This drug decreases your body's ability to fight infections. Try to avoid being around people who are sick. This medicine may increase your risk to bruise or bleed. Call your doctor or health care professional if you notice any unusual bleeding. Be careful brushing and flossing your teeth or using a toothpick because you may get an infection or bleed more easily. If you have any dental work done, tell your dentist you are receiving this medicine. Avoid taking products that contain aspirin, acetaminophen, ibuprofen, naproxen, or ketoprofen unless instructed  by your doctor. These medicines may hide a fever. Do not become pregnant while taking this medicine. Women should inform their doctor if they wish to become pregnant or think they might be pregnant. There is a potential for serious side  effects to an unborn child. Talk to your health care professional or pharmacist for more information. Do not breast-feed an infant while taking this medicine. Call your doctor or health care professional if you get diarrhea. Do not treat yourself. What side effects may I notice from receiving this medicine? Side effects that you should report to your doctor or health care professional as soon as possible: -allergic reactions like skin rash, itching or hives, swelling of the face, lips, or tongue -low blood counts - This drug may decrease the number of white blood cells, red blood cells and platelets. You may be at increased risk for infections and bleeding. -signs of infection - fever or chills, cough, sore throat, pain or difficulty passing urine -signs of decreased platelets or bleeding - bruising, pinpoint red spots on the skin, black, tarry stools, nosebleeds -signs of decreased red blood cells - unusually weak or tired, fainting spells, lightheadedness -breathing problems -chest pain, pressure -cough -diarrhea -jaw tightness -mouth sores -nausea and vomiting -pain, swelling, redness or irritation at the injection site -pain, tingling, numbness in the hands or feet -problems with balance, talking, walking -redness, blistering, peeling or loosening of the skin, including inside the mouth -trouble passing urine or change in the amount of urine Side effects that usually do not require medical attention (report to your doctor or health care professional if they continue or are bothersome): -changes in vision -constipation -hair loss -loss of appetite -metallic taste in the mouth or changes in taste -stomach pain This list may not describe all possible side effects. Call your doctor for medical advice about side effects. You may report side effects to FDA at 1-800-FDA-1088. Where should I keep my medicine? This drug is given in a hospital or clinic and will not be stored at home. NOTE:  This sheet is a summary. It may not cover all possible information. If you have questions about this medicine, talk to your doctor, pharmacist, or health care provider.    2016, [employer] Standard. (2007-08-27 17:22:47) Fluorouracil, 5-FU skin cream or solution What is this medicine? FLUOROURACIL, 5-FU (flure oh YOOR a sil) is a chemotherapy agent. It is used on the skin to treat skin cancer and skin conditions that could become cancer. This medicine may be used for other purposes; ask your health care provider or pharmacist if you have questions. What should I tell my health care provider before I take this medicine? They need to know if you have any of these conditions: -DPD enzyme deficiency -recent or current radiation therapy -swelling or open sores at the treatment site -an unusual or allergic reaction to fluorouracil, other chemotherapy, other medicines, foods, dyes, or preservatives -pregnant or trying to get pregnant -breast-feeding How should I use this medicine? This medicine is only for use on the skin. Follow the directions on the prescription label. Wash hands before and after use. Wash affected area and gently pat dry. To apply this medicine use a cotton-tipped applicator, or use gloves if applying with fingertips. If applied with unprotected fingertips, it is very important to wash your hands well after you apply this medicine. Avoid applying to the eyes, nose, or mouth. Apply enough medicine to cover the affected area. You can cover the area with a light  gauze dressing, but do not use tight or air-tight dressings. Finish the full course prescribed by your doctor or health care professional, even if you think your condition is better. Do not stop taking except on the advice of your doctor or health care professional. Talk to your pediatrician regarding the use of this medicine in children. Special care may be needed. Overdosage: If you think you have taken too much of this  medicine contact a poison control center or emergency room at once. NOTE: This medicine is only for you. Do not share this medicine with others. What if I miss a dose? If you miss a dose, apply it as soon as you can. If it is almost time for your next dose, only use that dose. Do not apply extra doses. Contact your doctor or health care professional if you miss more than one dose. What may interact with this medicine? Interactions are not expected. Do not use any other skin products without telling your doctor or health care professional. This list may not describe all possible interactions. Give your health care provider a list of all the medicines, herbs, non-prescription drugs, or dietary supplements you use. Also tell them if you smoke, drink alcohol, or use illegal drugs. Some items may interact with your medicine. What should I watch for while using this medicine? Visit your doctor or health care professional for checks on your progress. You will need to use this medicine for 2 to 6 weeks. This may be longer depending on the condition being treated. You may not see full healing for another 1 to 2 months after you stop using the medicine. Treated areas of skin can look unsightly during and for several weeks after treatment with this medicine. Do not get this medicine in your eyes. If you do, rinse out with plenty of cool tap water. This medicine can make you more sensitive to the sun. Keep out of the sun. If you cannot avoid being in the sun, wear protective clothing and use sunscreen. Do not use sun lamps or tanning beds/booths. What side effects may I notice from receiving this medicine? Side effects that you should report to your doctor or health care professional as soon as possible: - severe redness and swelling of normal skin Side effects that usually do not require medical attention (report to your doctor or health care professional if they continue or are bothersome): -dark colored  skin -eye irritation including burning, itching, sensitivity, stinging, or watering -sensitivity to light -skin irritation including burning, itching, pain, redness, scarring, or swelling of the affected area ---eye irritation including burning, itching, sensitivity, stinging, or watering -increased sensitivity of the skin to sun and ultraviolet light -ppain and burning, redness, irritation of the affected area ---s-s--skin rash, itching of the affected area-sensitivity to light--t --tenderness This list may not describe all possible side effects. Call your doctor for medical advice about side effects. You may report side effects to FDA at 1-800-FDA-1088. Where should I keep my medicine? Keep out of the reach of children. Store at room temperature between 15 and 30 degrees C (59 and 86 degrees F). Do not freeze. Keep container tightly closed. Throw away any unused medicine after the expiration date. NOTE: This sheet is a summary. It may not cover all possible information. If you have questions about this medicine, talk to your doctor, pharmacist, or health care provider.    2016, [employer] Standard. (2014-09-29 08:46:56) Leucovorin injection What is this medicine? LEUCOVORIN (loo koe VOR in) is  used to prevent or treat the harmful effects of some medicines. This medicine is used to treat anemia caused by a low amount of folic acid in the body. It is also used with 5-fluorouracil (5-FU) to treat colon cancer. This medicine may be used for other purposes; ask your health care provider or pharmacist if you have questions. What should I tell my health care provider before I take this medicine? They need to know if you have any of these conditions: -anemia from low levels of vitamin B-12 in the blood -an unusual or allergic reaction to leucovorin, folic acid, other medicines, foods, dyes, or preservatives -pregnant or trying to get pregnant -breast-feeding How should I use this  medicine? This medicine is for injection into a muscle or into a vein. It is given by a health care professional in a hospital or clinic setting. Talk to your pediatrician regarding the use of this medicine in children. Special care may be needed. Overdosage: If you think you have taken too much of this medicine contact a poison control center or emergency room at once. NOTE: This medicine is only for you. Do not share this medicine with others. What if I miss a dose? This does not apply. What may interact with this medicine? -capecitabine -fluorouracil -phenobarbital -phenytoin -primidone -trimethoprim-sulfamethoxazole This list may not describe all possible interactions. Give your health care provider a list of all the medicines, herbs, non-prescription drugs, or dietary supplements you use. Also tell them if you smoke, drink alcohol, or use illegal drugs. Some items may interact with your medicine. What should I watch for while using this medicine? Your condition will be monitored carefully while you are receiving this medicine. This medicine may increase the side effects of 5-fluorouracil, 5-FU. Tell your doctor or health care professional if you have diarrhea or mouth sores that do not get better or that get worse. What side effects may I notice from receiving this medicine? Side effects that you should report to your doctor or health care professional as soon as possible: -allergic reactions like skin rash, itching or hives, swelling of the face, lips, or tongue -breathing problems -fever, infection -mouth sores -unusual bleeding or bruising -unusually weak or tired Side effects that usually do not require medical attention (report to your doctor or health care professional if they continue or are bothersome): -constipation or diarrhea -loss of appetite -nausea, vomiting This list may not describe all possible side effects. Call your doctor for medical advice about side effects.  You may report side effects to FDA at 1-800-FDA-1088. Where should I keep my medicine? This drug is given in a hospital or clinic and will not be stored at home. NOTE: This sheet is a summary. It may not cover all possible information. If you have questions about this medicine, talk to your doctor, pharmacist, or health care provider.    2016, [employer] Standard. (2007-08-06 16:50:29)

## 2015-10-21 ENCOUNTER — Inpatient Hospital Stay: Payer: Medicare Other

## 2015-10-25 ENCOUNTER — Telehealth: Payer: Self-pay | Admitting: *Deleted

## 2015-10-25 ENCOUNTER — Other Ambulatory Visit: Payer: Self-pay | Admitting: Vascular Surgery

## 2015-10-25 NOTE — Telephone Encounter (Signed)
I spoke with Dr. Bunnie Domino office to confirm they have orders. Port orders received, Dr. Bunnie Domino office will contact patient for scheduling. I informed his office that patient is scheduled to start chemotherapy 9/13, office states they will work on getting port insertion scheduled quickly.

## 2015-10-25 NOTE — Telephone Encounter (Signed)
Left message with wife regarding port placement information. Informed her that she would be contacted by Dr. Bunnie Domino office regarding pre op information.

## 2015-10-26 ENCOUNTER — Ambulatory Visit
Admission: RE | Admit: 2015-10-26 | Discharge: 2015-10-26 | Disposition: A | Discharge status: Home / Self Care | Payer: Medicare Other | Source: Ambulatory Visit | Attending: Vascular Surgery | Admitting: Vascular Surgery

## 2015-10-26 ENCOUNTER — Encounter
Admission: RE | Disposition: A | Discharge status: Home / Self Care | Payer: Self-pay | Source: Ambulatory Visit | Attending: Vascular Surgery

## 2015-10-26 ENCOUNTER — Ambulatory Visit
Admission: RE | Admit: 2015-10-26 | Discharge: 2015-10-26 | Disposition: A | Discharge status: Home / Self Care | Payer: Medicare Other | Source: Ambulatory Visit | Attending: Radiation Oncology | Admitting: Radiation Oncology

## 2015-10-26 ENCOUNTER — Other Ambulatory Visit: Payer: Medicare Other

## 2015-10-26 ENCOUNTER — Encounter: Payer: Self-pay | Admitting: *Deleted

## 2015-10-26 ENCOUNTER — Encounter: Payer: Self-pay | Admitting: Radiation Oncology

## 2015-10-26 VITALS — BP 138/63 | HR 80 | Temp 97.7°F | Wt 142.2 lb

## 2015-10-26 DIAGNOSIS — C159 Malignant neoplasm of esophagus, unspecified: Secondary | ICD-10-CM | POA: Diagnosis not present

## 2015-10-26 DIAGNOSIS — C7972 Secondary malignant neoplasm of left adrenal gland: Secondary | ICD-10-CM | POA: Insufficient documentation

## 2015-10-26 DIAGNOSIS — M129 Arthropathy, unspecified: Secondary | ICD-10-CM | POA: Diagnosis not present

## 2015-10-26 DIAGNOSIS — M1990 Unspecified osteoarthritis, unspecified site: Secondary | ICD-10-CM | POA: Diagnosis not present

## 2015-10-26 DIAGNOSIS — Z87891 Personal history of nicotine dependence: Secondary | ICD-10-CM | POA: Insufficient documentation

## 2015-10-26 DIAGNOSIS — Z8673 Personal history of transient ischemic attack (TIA), and cerebral infarction without residual deficits: Secondary | ICD-10-CM | POA: Insufficient documentation

## 2015-10-26 DIAGNOSIS — I77811 Abdominal aortic ectasia: Secondary | ICD-10-CM | POA: Insufficient documentation

## 2015-10-26 DIAGNOSIS — Z7984 Long term (current) use of oral hypoglycemic drugs: Secondary | ICD-10-CM | POA: Insufficient documentation

## 2015-10-26 DIAGNOSIS — R1310 Dysphagia, unspecified: Secondary | ICD-10-CM | POA: Diagnosis not present

## 2015-10-26 DIAGNOSIS — Z7982 Long term (current) use of aspirin: Secondary | ICD-10-CM | POA: Insufficient documentation

## 2015-10-26 DIAGNOSIS — C7801 Secondary malignant neoplasm of right lung: Secondary | ICD-10-CM | POA: Diagnosis not present

## 2015-10-26 DIAGNOSIS — D509 Iron deficiency anemia, unspecified: Secondary | ICD-10-CM | POA: Diagnosis not present

## 2015-10-26 DIAGNOSIS — I340 Nonrheumatic mitral (valve) insufficiency: Secondary | ICD-10-CM | POA: Diagnosis not present

## 2015-10-26 DIAGNOSIS — C787 Secondary malignant neoplasm of liver and intrahepatic bile duct: Secondary | ICD-10-CM | POA: Insufficient documentation

## 2015-10-26 DIAGNOSIS — C781 Secondary malignant neoplasm of mediastinum: Secondary | ICD-10-CM | POA: Diagnosis not present

## 2015-10-26 DIAGNOSIS — I10 Essential (primary) hypertension: Secondary | ICD-10-CM | POA: Insufficient documentation

## 2015-10-26 DIAGNOSIS — K228 Other specified diseases of esophagus: Secondary | ICD-10-CM | POA: Insufficient documentation

## 2015-10-26 DIAGNOSIS — G4730 Sleep apnea, unspecified: Secondary | ICD-10-CM | POA: Insufficient documentation

## 2015-10-26 DIAGNOSIS — M5134 Other intervertebral disc degeneration, thoracic region: Secondary | ICD-10-CM | POA: Insufficient documentation

## 2015-10-26 DIAGNOSIS — C155 Malignant neoplasm of lower third of esophagus: Secondary | ICD-10-CM | POA: Diagnosis not present

## 2015-10-26 DIAGNOSIS — Z801 Family history of malignant neoplasm of trachea, bronchus and lung: Secondary | ICD-10-CM | POA: Insufficient documentation

## 2015-10-26 DIAGNOSIS — H918X2 Other specified hearing loss, left ear: Secondary | ICD-10-CM | POA: Insufficient documentation

## 2015-10-26 DIAGNOSIS — I771 Stricture of artery: Secondary | ICD-10-CM | POA: Diagnosis not present

## 2015-10-26 DIAGNOSIS — Z510 Encounter for antineoplastic radiation therapy: Secondary | ICD-10-CM | POA: Insufficient documentation

## 2015-10-26 DIAGNOSIS — Z8249 Family history of ischemic heart disease and other diseases of the circulatory system: Secondary | ICD-10-CM | POA: Diagnosis not present

## 2015-10-26 DIAGNOSIS — C7802 Secondary malignant neoplasm of left lung: Secondary | ICD-10-CM | POA: Insufficient documentation

## 2015-10-26 DIAGNOSIS — E039 Hypothyroidism, unspecified: Secondary | ICD-10-CM | POA: Diagnosis not present

## 2015-10-26 DIAGNOSIS — E119 Type 2 diabetes mellitus without complications: Secondary | ICD-10-CM | POA: Diagnosis not present

## 2015-10-26 DIAGNOSIS — D649 Anemia, unspecified: Secondary | ICD-10-CM | POA: Diagnosis not present

## 2015-10-26 DIAGNOSIS — R634 Abnormal weight loss: Secondary | ICD-10-CM | POA: Diagnosis not present

## 2015-10-26 HISTORY — DX: Hepatomegaly, not elsewhere classified: R16.0

## 2015-10-26 HISTORY — PX: PERIPHERAL VASCULAR CATHETERIZATION: SHX172C

## 2015-10-26 LAB — GLUCOSE, CAPILLARY: GLUCOSE-CAPILLARY: 126 mg/dL — AB (ref 65–99)

## 2015-10-26 SURGERY — PORTA CATH INSERTION
Anesthesia: Moderate Sedation

## 2015-10-26 MED ORDER — FENTANYL CITRATE (PF) 100 MCG/2ML IJ SOLN
INTRAMUSCULAR | Status: DC | PRN
  Administered 2015-10-26: 50 ug via INTRAVENOUS

## 2015-10-26 MED ORDER — HEPARIN (PORCINE) IN NACL 2-0.9 UNIT/ML-% IJ SOLN
INTRAMUSCULAR | Status: AC
  Filled 2015-10-26: qty 500

## 2015-10-26 MED ORDER — ONDANSETRON HCL 4 MG/2ML IJ SOLN: 4.0000 mg | Freq: Four times a day (QID) | INTRAMUSCULAR | Status: DC | PRN

## 2015-10-26 MED ORDER — SODIUM CHLORIDE 0.9 % IV SOLN: INTRAVENOUS | Status: DC

## 2015-10-26 MED ORDER — FENTANYL CITRATE (PF) 100 MCG/2ML IJ SOLN
INTRAMUSCULAR | Status: AC
  Filled 2015-10-26: qty 2

## 2015-10-26 MED ORDER — CLINDAMYCIN PHOSPHATE 300 MG/50ML IV SOLN
300.0000 mg | Freq: Once | INTRAVENOUS | Status: AC
  Administered 2015-10-26: 300 mg via INTRAVENOUS

## 2015-10-26 MED ORDER — HYDROMORPHONE HCL 1 MG/ML IJ SOLN: 1.0000 mg | Freq: Once | INTRAMUSCULAR | Status: DC

## 2015-10-26 MED ORDER — SODIUM CHLORIDE 0.9 % IR SOLN
Freq: Once | Status: DC
  Filled 2015-10-26: qty 2

## 2015-10-26 MED ORDER — MIDAZOLAM HCL 5 MG/5ML IJ SOLN
INTRAMUSCULAR | Status: AC
  Filled 2015-10-26: qty 5

## 2015-10-26 MED ORDER — CLINDAMYCIN PHOSPHATE 300 MG/50ML IV SOLN
INTRAVENOUS | Status: AC
  Administered 2015-10-26: 300 mg via INTRAVENOUS
  Filled 2015-10-26: qty 50

## 2015-10-26 MED ORDER — LIDOCAINE-EPINEPHRINE (PF) 1 %-1:200000 IJ SOLN
INTRAMUSCULAR | Status: AC
  Filled 2015-10-26: qty 30

## 2015-10-26 MED ORDER — MIDAZOLAM HCL 2 MG/2ML IJ SOLN
INTRAMUSCULAR | Status: DC | PRN
  Administered 2015-10-26: 2 mg via INTRAVENOUS

## 2015-10-26 SURGICAL SUPPLY — 9 items
BAG DECANTER STRL (MISCELLANEOUS) ×3 IMPLANT
DRAPE INCISE IOBAN 66X45 STRL (DRAPES) ×3 IMPLANT
KIT PORT POWER 8FR ISP CVUE (Catheter) ×3 IMPLANT
PACK ANGIOGRAPHY (CUSTOM PROCEDURE TRAY) ×3 IMPLANT
PREP CHG 10.5 TEAL (MISCELLANEOUS) ×3 IMPLANT
SUT MNCRL AB 4-0 PS2 18 (SUTURE) ×3 IMPLANT
SUT PROLENE 0 CT 1 30 (SUTURE) ×3 IMPLANT
SUTURE VIC 3-0 (SUTURE) ×3 IMPLANT
TOWEL OR 17X26 4PK STRL BLUE (TOWEL DISPOSABLE) ×3 IMPLANT

## 2015-10-26 NOTE — Op Note (Signed)
OPERATIVE NOTE   PROCEDURE: 1. Placement of a right IJ Infuse-a-Port  PRE-OPERATIVE DIAGNOSIS: Esophageal carcinoma  POST-OPERATIVE DIAGNOSIS: Same  SURGEON: Katha Cabal M.D.  ANESTHESIA: Conscious sedation combined with 1% lidocaine with epinephrine  ESTIMATED BLOOD LOSS: Minimal   FINDING(S): 1.  Patent vein  SPECIMEN(S): None  INDICATIONS:   [name] is a 75 y.o. male who presents with esophageal carcinoma he will require chemotherapy and therefore appropriate intravenous access. He is therefore undergoing port placement.  DESCRIPTION: After obtaining full informed written consent, the patient was brought back to the special procedure suite and placed in the supine position. The patient's right neck neck and chest wall are prepped and draped in sterile fashion. Appropriate timeout was called.  Ultrasound is placed in a sterile sleeve, ultrasound is utilized to avoid vascular injury as well as secondary to lack of appropriate landmarks. The right internal jugular vein is identified. It is echolucent and homogeneous as well as easily compressible indicating patency. 1% lidocaine is infiltrated into the soft tissue at the base of the neck as well as on the chest wall.  Under direct ultrasound visualization Seldinger needle is inserted into the right internal jugular vein. J-wire is advanced under fluoroscopic guidance. A small counterincision was created at the wire insertion site. A transverse incision is created 2 fingerbreadths below the scapula and a pocket is fashioned using both blunt and sharp dissection. The pocket is tested for appropriate size with the hub of the Infuse-a-Port. The tunneling device is then used to pull the intravascular portion of the catheter from the pocket to the neck counterincision.  Dilator and peel-away sheath were then inserted over the wire and the wire is removed. Catheter is then advanced into the venous system without difficulty.  Peel-away sheath was then removed.  Catheter is then positioned under fluoroscopic guidance at the atrial caval junction. It is then transected connected to the hub and the hope is slipped into the subcutaneous pocket on the chest wall. The hub was then accessed percutaneously and aspirates easily and flushes well and is flushed with 30 cc of heparinized saline. The pocket incision is then closed in layers using interrupted 3-0 Vicryl for the subcutaneous tissues and 4-0 Monocryl subcuticular for skin closure. Dermabond is applied. The neck counterincision was closed with 4-0 Monocryl subcuticular and Dermabond as well.  The patient tolerated the procedure well and there were no immediate complications.  COMPLICATIONS: None  CONDITION: Unchanged  Katha Cabal M.D.  vein and vascular Office: [phone]   10/26/2015, 6:00 PM

## 2015-10-26 NOTE — Progress Notes (Signed)
y NEW PATIENT EVALUATION  Name: [name]  MRN: [id number]  Date:   10/26/2015     DOB: 1940-05-02   This 75 y.o. male patient presents to the clinic for initial evaluation of stage IV distal esophageal adenocarcinoma.  REFERRING PHYSICIAN: Arnetha Courser, MD  CHIEF COMPLAINT:  Chief Complaint  Patient presents with  . Esophageal Cancer    Initial evaluation for radiation treatments    DIAGNOSIS: The encounter diagnosis was Malignant neoplasm of lower third of esophagus ([place]).   PREVIOUS INVESTIGATIONS:  PET CT scans and CT scans reviewed Endoscopy report reviewed Pathology report reviewed Clinical notes reviewed  HPI: Patient is a 75 year old male presented with significant weight loss and increasing dysphagia with reflux over the past several months. This eventually prompted gastroneurology to perform an upper endoscopy. Do not have the formal report at this time although a lesion of the distal esophagus was visualized with biopsy positive for invasive adenocarcinoma. PET CT scan was performed showing hypermetabolic activity in the distal esophagus compatible with known disease. Also there was metastatic disease involving the mediastinum lungs and liver and left adrenal gland. Patient has been seen by medical oncology and is slated to start on FOLFOX. He is also having HER-2/neu testing ordered. He is scheduled to have a port placed. He is doing fairly well still has soft pured diet. Patient is also somewhat anemic and has been having treatment for that. I been asked to evaluate the patient for possible palliative radiation therapy to his distal esophagus to improve some of his dysphasia.  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    PAST SURGICAL HISTORY:  Past Surgical History:  Procedure Laterality Date  . APPENDECTOMY    . EUS N/A 10/07/2015   Procedure: FULL UPPER ENDOSCOPIC ULTRASOUND (EUS) RADIAL;  Surgeon: Holly Bodily, MD;  Location: [facility] ENDOSCOPY;  Service: Endoscopy;  Laterality: N/A;  . EYE SURGERY    . FRACTURE SURGERY    . NOSE SURGERY      FAMILY HISTORY: family history includes Hypertension in his brother and mother; Lung cancer in his father.  SOCIAL HISTORY:  reports that he quit smoking about 32 years ago. His smoking use included Cigarettes. He smoked 1.00 pack per day. He has never used smokeless tobacco. He reports that he drinks alcohol. He reports that he does not use drugs.  ALLERGIES: Sulfa antibiotics; Amoxicillin; Ampicillin; Keflex [cephalexin]; Lisinopril; Losartan; Sulfamethoxazole-trimethoprim; and Cephalosporins  MEDICATIONS:  No current facility-administered medications for this encounter.    No current outpatient prescriptions on file.   Facility-Administered Medications Ordered in Other Encounters  Medication Dose Route Frequency Provider Last Rate Last Dose  . 0.9 %  sodium chloride infusion   Intravenous Continuous Kimberly A Stegmayer, PA-C      . clindamycin (CLEOCIN) IVPB 300 mg  300 mg Intravenous Once [employer], PA-C      . gentamicin (GARAMYCIN) 80 mg in sodium chloride irrigation 0.9 % 500 mL irrigation   Irrigation Once [employer], PA-C      . HYDROmorphone (DILAUDID) injection 1 mg  1 mg Intravenous Once [employer], PA-C      .  ondansetron (ZOFRAN) injection 4 mg  4 mg Intravenous Q6H PRN Kimberly A Stegmayer, PA-C        ECOG PERFORMANCE STATUS:  1 - Symptomatic but completely ambulatory  REVIEW OF SYSTEMS:  Patient does have significant dysphagia and weight loss. No dark-colored stools.  Patient denies any weight loss, fatigue, weakness, fever, chills or night sweats. Patient denies any loss of vision, blurred vision. Patient denies any ringing  of the ears or hearing loss. No irregular heartbeat. Patient denies heart murmur or history of fainting. Patient denies any chest pain or pain radiating to her upper extremities. Patient denies any shortness of breath, difficulty breathing at night, cough or hemoptysis. Patient denies any swelling in the lower legs. Patient denies any nausea vomiting, vomiting of blood, or coffee ground material in the vomitus. Patient denies any stomach pain. Patient states has had normal bowel movements no significant constipation or diarrhea. Patient denies any dysuria, hematuria or significant nocturia. Patient denies any problems walking, swelling in the joints or loss of balance. Patient denies any skin changes, loss of hair or loss of weight. Patient denies any excessive worrying or anxiety or significant depression. Patient denies any problems with insomnia. Patient denies excessive thirst, polyuria, polydipsia. Patient denies any swollen glands, patient denies easy bruising or easy bleeding. Patient denies any recent infections, allergies or URI. Patient "s visual fields have not changed significantly in recent time.    PHYSICAL EXAM: BP 138/63   Pulse 80   Temp 97.7 F (36.5 C)   Wt 142 lb 3.2 oz (64.5 kg)   BMI 22.27 kg/m  Thin male in NAD. No cervical or supra clavicular adenopathy is identified. Well-developed well-nourished patient in NAD. HEENT reveals PERLA, EOMI, discs not visualized.  Oral cavity is clear. No oral mucosal lesions are identified. Neck is clear without evidence of cervical or supraclavicular adenopathy. Lungs are clear to A&P. Cardiac examination is essentially unremarkable with regular rate and rhythm without murmur rub or thrill. Abdomen is  benign with no organomegaly or masses noted. Motor sensory and DTR levels are equal and symmetric in the upper and lower extremities. Cranial nerves II through XII are grossly intact. Proprioception is intact. No peripheral adenopathy or edema is identified. No motor or sensory levels are noted. Crude visual fields are within normal range.  LABORATORY DATA: Pathology reports reviewed    RADIOLOGY RESULTS: CT scan and PET CT scan reviewed   IMPRESSION: Stage IV distal esophageal adenocarcinoma with liver lung and mediastinal nodal metastasis in 75 year old male  PLAN: At this time I like to go ahead with palliative radiation therapy. I will take his esophagus to 5040 cGy in 28 fractions trying to include some of the mediastinal lymph nodes as well as the hypermetabolic activity on PET/CT. I will use PET/CT fusion study to delineate actual areas of targeting. Risks and benefits of treatment including possible worsening of his dysphasia from radiation esophagitis, fatigue, alteration of blood counts, skin reaction all were discussed in detail with the patient and his wife.There will be extra effort by both professional staff as well as technical staff to coordinate and manage concurrent chemoradiation and ensuing side effects during his treatments. I first set up and ordered CT simulation for later this week. Patient and wife both seem to comprehend my treatment plan well.  I would like to take this opportunity to thank you for allowing me to participate in the care of your patient.Armstead Peaks., MD

## 2015-10-26 NOTE — Progress Notes (Signed)
[place]  [phone] Fax:(336) [phone]  [name] OB: 07-Jul-1940  MR#: [id number]  MMH#:[id number]  Patient Care Team: Arnetha Courser, MD as PCP - General (Family Medicine) D Orion Modest, OD (Optometry) Wellington Hampshire, MD as Consulting Physician (Cardiology) Lloyd Huger, MD as Consulting Physician (Oncology) Lucilla Lame, MD as Consulting Physician (Gastroenterology)  CHIEF COMPLAINT: Stage IV adenocarcinoma of the lower third esophagus with liver metastasis.  INTERVAL HISTORY:  Patient returns to clinic today for further evaluation and to initiate cycle 1 of FOLFOX.  He recently had a port placed without significant problems. He continues to have persistent vomiting and weight loss. He has significant weakness and fatigue. He has no neurologic complaints. He denies any fevers. He denies any chest pain or shortness of breath. He denies any nausea, vomiting, consultation, or diarrhea. He denies any melena or hematochezia. Patient offers no further specific complaints.  REVIEW OF SYSTEMS:   Review of Systems  Constitutional: Positive for malaise/fatigue and weight loss. Negative for fever.  Respiratory: Negative.  Negative for cough and shortness of breath.   Cardiovascular: Negative.  Negative for chest pain.  Gastrointestinal: Positive for vomiting. Negative for abdominal pain, blood in stool and melena.  Musculoskeletal: Negative.   Skin: Negative.   Neurological: Positive for weakness.  Psychiatric/Behavioral: Negative.     As per HPI. Otherwise, a complete review of systems is negative.  PAST MEDICAL HISTORY: Past Medical History:  Diagnosis Date  . Anemia   . Arthritis   . 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 Diabetes mellitus without complication ([place])   . Ectatic abdominal aorta ([place]) 08/12/2015  . Esophageal cancer ([place])   . GI bleed   . Hearing loss    left ear  . Hypertension   . Hypothyroidism    . Liver mass   . Mild left atrial enlargement 08/12/2015   Very mild; LA 4.2 cm; echo March 2017  . Mitral valvular regurgitation 08/12/2015  . Sleep apnea   . Stroke ([place])   . TIA (transient ischemic attack)   . 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 APPENDECTOMY    . EUS N/A 10/07/2015   Procedure: FULL UPPER ENDOSCOPIC ULTRASOUND (EUS) RADIAL;  Surgeon: Holly Bodily, MD;  Location: [facility] ENDOSCOPY;  Service: Endoscopy;  Laterality: N/A;  . EYE SURGERY    . FRACTURE SURGERY    . NOSE SURGERY    . PERIPHERAL VASCULAR CATHETERIZATION N/A 10/26/2015   Procedure: Glori Luis Cath Insertion;  Surgeon: Katha Cabal, MD;  Location: [facility];  Service: Cardiovascular;  Laterality: N/A;    FAMILY HISTORY Family History  Problem Relation Age of Onset  . Hypertension Mother   . Lung cancer Father   . Hypertension Brother        ADVANCED DIRECTIVES:    HEALTH MAINTENANCE: Social History  Substance Use Topics  . Smoking status: Former Smoker    Packs/day: 1.00    Types: Cigarettes    Quit date: 10/03/1983  . Smokeless tobacco: Never Used  . Alcohol use 0.0 oz/week     Comment: rarely     Colonoscopy:  PAP:  Bone density:  Lipid panel:  Allergies  Allergen Reactions  . Sulfa Antibiotics Itching and Hives  . Amoxicillin Hives  . Ampicillin Hives  . Keflex [Cephalexin] Itching  . Lisinopril Other (See  Comments)  . Losartan Other (See Comments)  . Sulfamethoxazole-Trimethoprim Other (See Comments)  . Cephalosporins Hives    Current Outpatient Prescriptions  Medication Sig Dispense Refill  . aspirin EC 81 MG tablet Take 162 mg by mouth 2 (two) times daily.    . Biotin (BIOTIN 5000) 5 MG CAPS Take by mouth.    . Cholecalciferol (VITAMIN D3) 1000 UNITS CAPS Take 2,000 Units by mouth daily.    . Coenzyme Q10 (CO Q-10) 200 MG CAPS Take by mouth.    Marland Kitchen  FREESTYLE LITE test strip USE 1 STRIP BID  3  . isosorbide mononitrate (IMDUR) 60 MG 24 hr tablet Take 1 tablet (60 mg total) by mouth daily. 30 tablet 0  . levothyroxine (SYNTHROID, LEVOTHROID) 100 MCG tablet Take 100 mcg by mouth every morning.    . lidocaine-prilocaine (EMLA) cream Apply 1 application topically as needed. Apply to port 1-2 hours prior to chemotherapy. Cover with plastic wrap. 30 g 2  . Lutein 40 MG CAPS Take by mouth.    . Magnesium Oxide 250 MG TABS Take 1 tablet (250 mg total) by mouth daily.  0  . metFORMIN (GLUCOPHAGE) 1000 MG tablet Take 2,000 mg by mouth daily.    . metoprolol tartrate (LOPRESSOR) 25 MG tablet Take 12.5 mg by mouth 2 (two) times daily.     . Multiple Vitamins-Minerals (MULTI FOR HIM 50+ PO) Take 1 tablet by mouth daily.    . Omega-3 Fatty Acids (FISH OIL) 1000 MG CAPS Take 1 capsule by mouth daily.    . ondansetron (ZOFRAN) 8 MG tablet Take 1 tablet (8 mg total) by mouth every 8 (eight) hours as needed for nausea or vomiting. 30 tablet 2  . prochlorperazine (COMPAZINE) 10 MG tablet Take 1 tablet (10 mg total) by mouth every 6 (six) hours as needed for nausea or vomiting. 30 tablet 2  . Red Yeast Rice 600 MG TABS Take 2 tablets by mouth at bedtime.     No current facility-administered medications for this visit.    Facility-Administered Medications Ordered in Other Visits  Medication Dose Route Frequency Provider Last Rate Last Dose  . dexamethasone (DECADRON) 10 mg in sodium chloride 0.9 % 50 mL IVPB  10 mg Intravenous Once Lloyd Huger, MD      . dextrose 5 % solution   Intravenous Once Lloyd Huger, MD      . fluorouracil (ADRUCIL) 4,200 mg in sodium chloride 0.9 % 66 mL chemo infusion  2,400 mg/m2 (Treatment Plan Recorded) Intravenous 1 day or 1 dose Lloyd Huger, MD      . fluorouracil (ADRUCIL) chemo injection 700 mg  400 mg/m2 (Treatment Plan Recorded) Intravenous Once Lloyd Huger, MD      . heparin lock flush 100 unit/mL   500 Units Intracatheter Once PRN Lloyd Huger, MD      . leucovorin 700 mg in dextrose 5 % 250 mL infusion  700 mg Intravenous Once Lloyd Huger, MD      . oxaliplatin (ELOXATIN) 150 mg in dextrose 5 % 500 mL chemo infusion  85 mg/m2 (Treatment Plan Recorded) Intravenous Once Lloyd Huger, MD      . palonosetron (ALOXI) injection 0.25 mg  0.25 mg Intravenous Once Lloyd Huger, MD      . sodium chloride flush (NS) 0.9 % injection 10 mL  10 mL Intracatheter PRN Lloyd Huger, MD        OBJECTIVE: Vitals:   10/27/15  0953  BP: 128/70  Pulse: 81  Temp: 97 F (36.1 C)     Body mass index is 21.93 kg/m.    ECOG FS:1 - Symptomatic but completely ambulatory  General: Well-developed, well-nourished, no acute distress. Eyes: Pink conjunctiva, anicteric sclera. Lungs: Clear to auscultation bilaterally. Heart: Regular rate and rhythm. No rubs, murmurs, or gallops. Abdomen: Soft, nontender, nondistended. No organomegaly noted, normoactive bowel sounds. Musculoskeletal: No edema, cyanosis, or clubbing. Neuro: Alert, answering all questions appropriately. Cranial nerves grossly intact. Skin: No rashes or petechiae noted. Psych: Normal affect.   LAB RESULTS:  Lab Results  Component Value Date   NA 131 (L) 10/27/2015   K 4.2 10/27/2015   CL 94 (L) 10/27/2015   CO2 26 10/27/2015   GLUCOSE 172 (H) 10/27/2015   BUN 14 10/27/2015   CREATININE 0.61 10/27/2015   CALCIUM 9.1 10/27/2015   PROT 7.7 10/27/2015   ALBUMIN 4.1 10/27/2015   AST 25 10/27/2015   ALT 15 (L) 10/27/2015   ALKPHOS 54 10/27/2015   BILITOT 0.3 10/27/2015   GFRNONAA >60 10/27/2015   GFRAA >60 10/27/2015    Lab Results  Component Value Date   WBC 12.1 (H) 10/27/2015   NEUTROABS 9.9 (H) 10/27/2015   HGB 8.8 (L) 10/27/2015   HCT 26.3 (L) 10/27/2015   MCV 79.9 (L) 10/27/2015   PLT 796 (H) 10/27/2015   Lab Results  Component Value Date   IRON 30 (L) 09/29/2015   TIBC 355 09/29/2015    IRONPCTSAT 9 (L) 09/29/2015    Lab Results  Component Value Date   FERRITIN 72 09/29/2015     STUDIES: Nm Pet Image Initial (pi) Skull Base To Thigh  Result Date: 10/11/2015 CLINICAL DATA:  Initial treatment strategy for esophageal thickening with metastases. EXAM: NUCLEAR MEDICINE PET SKULL BASE TO THIGH TECHNIQUE: 13.4 mCi F-18 FDG was injected intravenously. Full-ring PET imaging was performed from the skull base to thigh after the radiotracer. CT data was obtained and used for attenuation correction and anatomic localization. FASTING BLOOD GLUCOSE:  Value: 103 mg/dl COMPARISON:  MR abdomen 09/25/2015 and CT chest 10/04/2014. FINDINGS: NECK There is asymmetric hypermetabolism in the right occipital lobe, nonspecific. No hypermetabolic lymph nodes in the neck. CT images show no acute findings. CHEST Hypermetabolic distal esophageal mass has an SUV max of 17.1. Hypermetabolic mediastinal and subcarinal lymph nodes measure up to 1.8 cm in the subcarinal station, with an SUV max of 8.6. Multiple tiny hypermetabolic pulmonary nodules are seen with an index right perihilar nodule measuring 9 mm (image 105) with an SUV max of 7.1. Atherosclerotic calcification of the aorta and coronary arteries. No pericardial or pleural effusion. ABDOMEN/PELVIS There are 2 areas of abnormal hypermetabolism in the liver. Both are seen peripherally in the right hepatic lobe with an index ill-defined low-attenuation lesion inferiorly, measuring 2.0 cm with an SUV max of 9.7. 1.8 cm left adrenal nodule has an SUV max of 8.3. Conglomerate gastrohepatic ligament adenopathy measures approximately 4.8 x 6.3 cm with an SUV max of 9.2. Mildly hypermetabolic lymph node/nodule inferior to the stomach and along the posterior margin of the pancreatic tail measures 12 mm (CT image 139) with an SUV max of 4.9. A left inguinal hernia contains hypermetabolic soft tissue, measuring approximately 12 mm with an SUV max of 4.9. Liver,  gallbladder, right adrenal gland, kidneys, spleen, pancreas, stomach and bowel are otherwise grossly unremarkable. Aortic atherosclerosis. SKELETON Focal hypermetabolism is seen within the inferior right scapula, SUV max 4.0, without a definite  CT correlate. Abnormal hypermetabolism is seen in association with the right proximal posterior thigh musculature, with an SUV max of 4.0, corresponding to a 2.4 cm intramuscular low-attenuation lesion (CT image 272). IMPRESSION: 1. Distal esophageal carcinoma with metastatic disease involving the mediastinum, lungs, liver, left adrenal gland, upper abdominal lymph nodes, posterior right thigh musculature and possibly right scapula. 2. Hypermetabolic soft tissue within the left inguinal canal, possibly metastatic. 3. Mild asymmetric hypermetabolism in the right occipital lobe, nonspecific. Metastatic disease cannot be excluded. 4. Aortic atherosclerosis and coronary artery calcification. Electronically Signed   By: Lorin Picket M.D.   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  PLAN:    1. 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 Biopsy confirmed adenocarcinoma consistent with esophageal origin. Proceed with cycle 1 of FOLFOX today. HER-2 testing has been ordered and is pending at time of dictation. We will get MUGA scan if Herceptin is to be added to his regimen. Patient will also initiate XRT in the next 1-2 weeks to palliate his difficulty swallowing. Return to clinic in 2 days for pump removal and then in 2 weeks for consideration of cycle 2.  Will reimage after 6 cycles.  2. Iron deficiency anemia: Patient's hemoglobin and iron stores have trended down. Will give IV Feraheme with his next treatment. 3. Recent MI: Treatment per cardiology. 4. Weight loss: Likely secondary to underlying  malignancy, monitor. 5. Dysphagia: XRT as above.  Approximately 30 minutes was spent in discussion of which greater than 50% was consultation.  Patient expressed understanding and was in agreement with this plan. He also understands that He can call clinic at any time with any questions, concerns, or complaints.    Lloyd Huger, MD   10/27/2015 10:42 AM

## 2015-10-26 NOTE — H&P (Signed)
  [facility] History & Physical Update  The patient was interviewed and re-examined.  The patient's previous History and Physical has been reviewed and is unchanged.  There is no change in the plan of care. We plan to proceed with the scheduled procedure.  Schnier, Dolores Lory, MD  10/26/2015, 4:26 PM

## 2015-10-27 ENCOUNTER — Inpatient Hospital Stay (HOSPITAL_BASED_OUTPATIENT_CLINIC_OR_DEPARTMENT_OTHER): Payer: Medicare Other | Admitting: Oncology

## 2015-10-27 ENCOUNTER — Inpatient Hospital Stay: Payer: Medicare Other

## 2015-10-27 ENCOUNTER — Encounter: Payer: Self-pay | Admitting: Vascular Surgery

## 2015-10-27 ENCOUNTER — Telehealth: Payer: Self-pay | Admitting: Family Medicine

## 2015-10-27 VITALS — BP 128/70 | HR 81 | Temp 97.0°F | Wt 140.0 lb

## 2015-10-27 DIAGNOSIS — Z5111 Encounter for antineoplastic chemotherapy: Secondary | ICD-10-CM | POA: Diagnosis not present

## 2015-10-27 DIAGNOSIS — Z801 Family history of malignant neoplasm of trachea, bronchus and lung: Secondary | ICD-10-CM

## 2015-10-27 DIAGNOSIS — I340 Nonrheumatic mitral (valve) insufficiency: Secondary | ICD-10-CM

## 2015-10-27 DIAGNOSIS — Z79899 Other long term (current) drug therapy: Secondary | ICD-10-CM

## 2015-10-27 DIAGNOSIS — C155 Malignant neoplasm of lower third of esophagus: Secondary | ICD-10-CM

## 2015-10-27 DIAGNOSIS — Z7982 Long term (current) use of aspirin: Secondary | ICD-10-CM

## 2015-10-27 DIAGNOSIS — D509 Iron deficiency anemia, unspecified: Secondary | ICD-10-CM

## 2015-10-27 DIAGNOSIS — R531 Weakness: Secondary | ICD-10-CM

## 2015-10-27 DIAGNOSIS — Q2546 Tortuous aortic arch: Secondary | ICD-10-CM

## 2015-10-27 DIAGNOSIS — I252 Old myocardial infarction: Secondary | ICD-10-CM | POA: Diagnosis not present

## 2015-10-27 DIAGNOSIS — Z7984 Long term (current) use of oral hypoglycemic drugs: Secondary | ICD-10-CM

## 2015-10-27 DIAGNOSIS — C787 Secondary malignant neoplasm of liver and intrahepatic bile duct: Secondary | ICD-10-CM

## 2015-10-27 DIAGNOSIS — I10 Essential (primary) hypertension: Secondary | ICD-10-CM

## 2015-10-27 DIAGNOSIS — R5383 Other fatigue: Secondary | ICD-10-CM

## 2015-10-27 DIAGNOSIS — E119 Type 2 diabetes mellitus without complications: Secondary | ICD-10-CM

## 2015-10-27 DIAGNOSIS — E039 Hypothyroidism, unspecified: Secondary | ICD-10-CM

## 2015-10-27 DIAGNOSIS — M129 Arthropathy, unspecified: Secondary | ICD-10-CM

## 2015-10-27 DIAGNOSIS — M5134 Other intervertebral disc degeneration, thoracic region: Secondary | ICD-10-CM

## 2015-10-27 DIAGNOSIS — R634 Abnormal weight loss: Secondary | ICD-10-CM | POA: Diagnosis not present

## 2015-10-27 DIAGNOSIS — G4730 Sleep apnea, unspecified: Secondary | ICD-10-CM

## 2015-10-27 DIAGNOSIS — R1310 Dysphagia, unspecified: Secondary | ICD-10-CM

## 2015-10-27 DIAGNOSIS — I77811 Abdominal aortic ectasia: Secondary | ICD-10-CM

## 2015-10-27 DIAGNOSIS — R112 Nausea with vomiting, unspecified: Secondary | ICD-10-CM

## 2015-10-27 DIAGNOSIS — Z87891 Personal history of nicotine dependence: Secondary | ICD-10-CM

## 2015-10-27 DIAGNOSIS — Z8673 Personal history of transient ischemic attack (TIA), and cerebral infarction without residual deficits: Secondary | ICD-10-CM

## 2015-10-27 LAB — CBC WITH DIFFERENTIAL/PLATELET
BASOS ABS: 0.1 10*3/uL (ref 0–0.1)
Basophils Relative: 1 %
EOS ABS: 0.2 10*3/uL (ref 0–0.7)
EOS PCT: 1 %
HCT: 26.3 % — ABNORMAL LOW (ref 40.0–52.0)
Hemoglobin: 8.8 g/dL — ABNORMAL LOW (ref 13.0–18.0)
Lymphocytes Relative: 7 %
Lymphs Abs: 0.8 10*3/uL — ABNORMAL LOW (ref 1.0–3.6)
MCH: 26.6 pg (ref 26.0–34.0)
MCHC: 33.3 g/dL (ref 32.0–36.0)
MCV: 79.9 fL — ABNORMAL LOW (ref 80.0–100.0)
Monocytes Absolute: 1.1 10*3/uL — ABNORMAL HIGH (ref 0.2–1.0)
Monocytes Relative: 9 %
Neutro Abs: 9.9 10*3/uL — ABNORMAL HIGH (ref 1.4–6.5)
Neutrophils Relative %: 82 %
PLATELETS: 796 10*3/uL — AB (ref 150–440)
RBC: 3.29 MIL/uL — AB (ref 4.40–5.90)
RDW: 17.5 % — ABNORMAL HIGH (ref 11.5–14.5)
WBC: 12.1 10*3/uL — AB (ref 3.8–10.6)

## 2015-10-27 LAB — COMPREHENSIVE METABOLIC PANEL
ALT: 15 U/L — AB (ref 17–63)
AST: 25 U/L (ref 15–41)
Albumin: 4.1 g/dL (ref 3.5–5.0)
Alkaline Phosphatase: 54 U/L (ref 38–126)
Anion gap: 11 (ref 5–15)
BUN: 14 mg/dL (ref 6–20)
CHLORIDE: 94 mmol/L — AB (ref 101–111)
CO2: 26 mmol/L (ref 22–32)
CREATININE: 0.61 mg/dL (ref 0.61–1.24)
Calcium: 9.1 mg/dL (ref 8.9–10.3)
GFR calc non Af Amer: >60 mL/min (ref >60)
Glucose, Bld: 172 mg/dL — ABNORMAL HIGH (ref 65–99)
Potassium: 4.2 mmol/L (ref 3.5–5.1)
SODIUM: 131 mmol/L — AB (ref 135–145)
Total Bilirubin: 0.3 mg/dL (ref 0.3–1.2)
Total Protein: 7.7 g/dL (ref 6.5–8.1)

## 2015-10-27 MED ORDER — SODIUM CHLORIDE 0.9% FLUSH
10.0000 mL | INTRAVENOUS | Status: DC | PRN
  Administered 2015-10-27: 10 mL
  Filled 2015-10-27: qty 10

## 2015-10-27 MED ORDER — DEXTROSE 5 % IV SOLN
Freq: Once | INTRAVENOUS | Status: AC
  Administered 2015-10-27: 11:00:00 via INTRAVENOUS
  Filled 2015-10-27: qty 1000

## 2015-10-27 MED ORDER — FLUOROURACIL CHEMO INJECTION 5 GM/100ML
2400.0000 mg/m2 | INTRAVENOUS | Status: DC
  Administered 2015-10-27: 4200 mg via INTRAVENOUS
  Filled 2015-10-27: qty 84

## 2015-10-27 MED ORDER — LEUCOVORIN CALCIUM INJECTION 350 MG
700.0000 mg | Freq: Once | INTRAMUSCULAR | Status: AC
Start: 2015-10-27 — End: 2015-10-27
  Administered 2015-10-27: 700 mg via INTRAVENOUS
  Filled 2015-10-27: qty 35

## 2015-10-27 MED ORDER — HEPARIN SOD (PORK) LOCK FLUSH 100 UNIT/ML IV SOLN
500.0000 [IU] | Freq: Once | INTRAVENOUS | Status: DC | PRN
  Filled 2015-10-27: qty 5

## 2015-10-27 MED ORDER — PALONOSETRON HCL INJECTION 0.25 MG/5ML
0.2500 mg | Freq: Once | INTRAVENOUS | Status: AC
  Administered 2015-10-27: 0.25 mg via INTRAVENOUS
  Filled 2015-10-27: qty 5

## 2015-10-27 MED ORDER — SODIUM CHLORIDE 0.9 % IV SOLN
10.0000 mg | Freq: Once | INTRAVENOUS | Status: AC
  Administered 2015-10-27: 10 mg via INTRAVENOUS
  Filled 2015-10-27: qty 1

## 2015-10-27 MED ORDER — FLUOROURACIL CHEMO INJECTION 2.5 GM/50ML
400.0000 mg/m2 | Freq: Once | INTRAVENOUS | Status: AC
  Administered 2015-10-27: 700 mg via INTRAVENOUS
  Filled 2015-10-27: qty 14

## 2015-10-27 MED ORDER — OXALIPLATIN CHEMO INJECTION 100 MG/20ML
85.0000 mg/m2 | Freq: Once | INTRAVENOUS | Status: AC
  Administered 2015-10-27: 150 mg via INTRAVENOUS
  Filled 2015-10-27: qty 20

## 2015-10-27 NOTE — Telephone Encounter (Signed)
I talked with patient's wife; offered support

## 2015-10-28 ENCOUNTER — Encounter: Payer: Self-pay | Admitting: *Deleted

## 2015-10-28 ENCOUNTER — Encounter: Payer: Self-pay | Admitting: Oncology

## 2015-10-28 ENCOUNTER — Ambulatory Visit
Admission: RE | Admit: 2015-10-28 | Discharge: 2015-10-28 | Disposition: A | Discharge status: Home / Self Care | Payer: Medicare Other | Source: Ambulatory Visit | Attending: Radiation Oncology | Admitting: Radiation Oncology

## 2015-10-28 DIAGNOSIS — C7802 Secondary malignant neoplasm of left lung: Secondary | ICD-10-CM | POA: Diagnosis not present

## 2015-10-28 DIAGNOSIS — C787 Secondary malignant neoplasm of liver and intrahepatic bile duct: Secondary | ICD-10-CM | POA: Diagnosis not present

## 2015-10-28 DIAGNOSIS — C7801 Secondary malignant neoplasm of right lung: Secondary | ICD-10-CM | POA: Diagnosis not present

## 2015-10-28 DIAGNOSIS — C155 Malignant neoplasm of lower third of esophagus: Secondary | ICD-10-CM | POA: Diagnosis not present

## 2015-10-28 DIAGNOSIS — C781 Secondary malignant neoplasm of mediastinum: Secondary | ICD-10-CM | POA: Diagnosis not present

## 2015-10-28 DIAGNOSIS — Z510 Encounter for antineoplastic radiation therapy: Secondary | ICD-10-CM | POA: Diagnosis not present

## 2015-10-28 LAB — CANCER ANTIGEN 19-9: CA 19 9: 85995 U/mL — AB (ref 0–35)

## 2015-10-28 LAB — CEA: CEA: 819.5 ng/mL — AB (ref 0.0–4.7)

## 2015-10-28 LAB — SURGICAL PATHOLOGY

## 2015-10-29 ENCOUNTER — Inpatient Hospital Stay: Payer: Medicare Other

## 2015-10-29 DIAGNOSIS — Z5111 Encounter for antineoplastic chemotherapy: Secondary | ICD-10-CM | POA: Diagnosis not present

## 2015-10-29 DIAGNOSIS — R5383 Other fatigue: Secondary | ICD-10-CM | POA: Diagnosis not present

## 2015-10-29 DIAGNOSIS — Z87891 Personal history of nicotine dependence: Secondary | ICD-10-CM | POA: Diagnosis not present

## 2015-10-29 DIAGNOSIS — Z7984 Long term (current) use of oral hypoglycemic drugs: Secondary | ICD-10-CM | POA: Diagnosis not present

## 2015-10-29 DIAGNOSIS — M5134 Other intervertebral disc degeneration, thoracic region: Secondary | ICD-10-CM | POA: Diagnosis not present

## 2015-10-29 DIAGNOSIS — I252 Old myocardial infarction: Secondary | ICD-10-CM | POA: Diagnosis not present

## 2015-10-29 DIAGNOSIS — R634 Abnormal weight loss: Secondary | ICD-10-CM | POA: Diagnosis not present

## 2015-10-29 DIAGNOSIS — M129 Arthropathy, unspecified: Secondary | ICD-10-CM | POA: Diagnosis not present

## 2015-10-29 DIAGNOSIS — C155 Malignant neoplasm of lower third of esophagus: Secondary | ICD-10-CM

## 2015-10-29 DIAGNOSIS — R531 Weakness: Secondary | ICD-10-CM | POA: Diagnosis not present

## 2015-10-29 DIAGNOSIS — Z7982 Long term (current) use of aspirin: Secondary | ICD-10-CM | POA: Diagnosis not present

## 2015-10-29 DIAGNOSIS — C801 Malignant (primary) neoplasm, unspecified: Secondary | ICD-10-CM

## 2015-10-29 DIAGNOSIS — G4730 Sleep apnea, unspecified: Secondary | ICD-10-CM | POA: Diagnosis not present

## 2015-10-29 DIAGNOSIS — C787 Secondary malignant neoplasm of liver and intrahepatic bile duct: Secondary | ICD-10-CM | POA: Diagnosis not present

## 2015-10-29 DIAGNOSIS — R1310 Dysphagia, unspecified: Secondary | ICD-10-CM | POA: Diagnosis not present

## 2015-10-29 DIAGNOSIS — I340 Nonrheumatic mitral (valve) insufficiency: Secondary | ICD-10-CM | POA: Diagnosis not present

## 2015-10-29 DIAGNOSIS — D509 Iron deficiency anemia, unspecified: Secondary | ICD-10-CM | POA: Diagnosis not present

## 2015-10-29 DIAGNOSIS — I10 Essential (primary) hypertension: Secondary | ICD-10-CM | POA: Diagnosis not present

## 2015-10-29 DIAGNOSIS — Z79899 Other long term (current) drug therapy: Secondary | ICD-10-CM | POA: Diagnosis not present

## 2015-10-29 DIAGNOSIS — E039 Hypothyroidism, unspecified: Secondary | ICD-10-CM | POA: Diagnosis not present

## 2015-10-29 DIAGNOSIS — Z8673 Personal history of transient ischemic attack (TIA), and cerebral infarction without residual deficits: Secondary | ICD-10-CM | POA: Diagnosis not present

## 2015-10-29 DIAGNOSIS — E119 Type 2 diabetes mellitus without complications: Secondary | ICD-10-CM | POA: Diagnosis not present

## 2015-10-29 DIAGNOSIS — R112 Nausea with vomiting, unspecified: Secondary | ICD-10-CM | POA: Diagnosis not present

## 2015-10-29 DIAGNOSIS — Q2546 Tortuous aortic arch: Secondary | ICD-10-CM | POA: Diagnosis not present

## 2015-10-29 DIAGNOSIS — I77811 Abdominal aortic ectasia: Secondary | ICD-10-CM | POA: Diagnosis not present

## 2015-10-29 MED ORDER — SODIUM CHLORIDE 0.9 % IJ SOLN
10.0000 mL | Freq: Once | INTRAMUSCULAR | Status: AC
  Administered 2015-10-29: 10 mL via INTRAVENOUS
  Filled 2015-10-29: qty 10

## 2015-10-29 MED ORDER — HEPARIN SOD (PORK) LOCK FLUSH 100 UNIT/ML IV SOLN
500.0000 [IU] | Freq: Once | INTRAVENOUS | Status: AC
  Administered 2015-10-29: 500 [IU] via INTRAVENOUS

## 2015-11-02 DIAGNOSIS — C7802 Secondary malignant neoplasm of left lung: Secondary | ICD-10-CM | POA: Diagnosis not present

## 2015-11-02 DIAGNOSIS — Z510 Encounter for antineoplastic radiation therapy: Secondary | ICD-10-CM | POA: Diagnosis not present

## 2015-11-02 DIAGNOSIS — C787 Secondary malignant neoplasm of liver and intrahepatic bile duct: Secondary | ICD-10-CM | POA: Diagnosis not present

## 2015-11-02 DIAGNOSIS — C155 Malignant neoplasm of lower third of esophagus: Secondary | ICD-10-CM | POA: Diagnosis not present

## 2015-11-02 DIAGNOSIS — C7801 Secondary malignant neoplasm of right lung: Secondary | ICD-10-CM | POA: Diagnosis not present

## 2015-11-02 DIAGNOSIS — C781 Secondary malignant neoplasm of mediastinum: Secondary | ICD-10-CM | POA: Diagnosis not present

## 2015-11-02 DIAGNOSIS — Z87891 Personal history of nicotine dependence: Secondary | ICD-10-CM | POA: Diagnosis not present

## 2015-11-02 DIAGNOSIS — Z23 Encounter for immunization: Secondary | ICD-10-CM | POA: Diagnosis not present

## 2015-11-08 DIAGNOSIS — C787 Secondary malignant neoplasm of liver and intrahepatic bile duct: Secondary | ICD-10-CM | POA: Diagnosis not present

## 2015-11-08 DIAGNOSIS — C7801 Secondary malignant neoplasm of right lung: Secondary | ICD-10-CM | POA: Diagnosis not present

## 2015-11-08 DIAGNOSIS — C781 Secondary malignant neoplasm of mediastinum: Secondary | ICD-10-CM | POA: Diagnosis not present

## 2015-11-08 DIAGNOSIS — Z510 Encounter for antineoplastic radiation therapy: Secondary | ICD-10-CM | POA: Diagnosis not present

## 2015-11-08 DIAGNOSIS — C155 Malignant neoplasm of lower third of esophagus: Secondary | ICD-10-CM | POA: Diagnosis not present

## 2015-11-08 DIAGNOSIS — C7802 Secondary malignant neoplasm of left lung: Secondary | ICD-10-CM | POA: Diagnosis not present

## 2015-11-09 ENCOUNTER — Ambulatory Visit
Admission: RE | Admit: 2015-11-09 | Discharge: 2015-11-09 | Disposition: A | Discharge status: Home / Self Care | Payer: Medicare Other | Source: Ambulatory Visit | Attending: Radiation Oncology | Admitting: Radiation Oncology

## 2015-11-09 ENCOUNTER — Other Ambulatory Visit: Payer: Self-pay | Admitting: *Deleted

## 2015-11-09 DIAGNOSIS — Z510 Encounter for antineoplastic radiation therapy: Secondary | ICD-10-CM | POA: Diagnosis not present

## 2015-11-09 DIAGNOSIS — C7802 Secondary malignant neoplasm of left lung: Secondary | ICD-10-CM | POA: Diagnosis not present

## 2015-11-09 DIAGNOSIS — C7801 Secondary malignant neoplasm of right lung: Secondary | ICD-10-CM | POA: Diagnosis not present

## 2015-11-09 DIAGNOSIS — Z87891 Personal history of nicotine dependence: Secondary | ICD-10-CM | POA: Diagnosis not present

## 2015-11-09 DIAGNOSIS — C155 Malignant neoplasm of lower third of esophagus: Secondary | ICD-10-CM | POA: Diagnosis not present

## 2015-11-09 DIAGNOSIS — C781 Secondary malignant neoplasm of mediastinum: Secondary | ICD-10-CM | POA: Diagnosis not present

## 2015-11-09 DIAGNOSIS — R634 Abnormal weight loss: Secondary | ICD-10-CM

## 2015-11-09 DIAGNOSIS — C787 Secondary malignant neoplasm of liver and intrahepatic bile duct: Secondary | ICD-10-CM | POA: Diagnosis not present

## 2015-11-09 NOTE — Progress Notes (Signed)
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  INTERVAL HISTORY:  Patient returns to clinic today for further evaluation and consideration of cycle 2 of FOLFOX.  He reports an improved appetite and swallowing, but still has some mild weight loss. His weakness and fatigue is still evident, but improved. He has no neurologic complaints. He denies any fevers. He denies any chest pain or shortness of breath. He denies any nausea, vomiting, constipation, or diarrhea. He denies any melena or hematochezia. Patient offers no further specific complaints.  REVIEW OF SYSTEMS:   Review of Systems  Constitutional: Positive for malaise/fatigue and weight loss. Negative for fever.  Respiratory: Negative.  Negative for cough and shortness of breath.   Cardiovascular: Negative.  Negative for chest pain.  Gastrointestinal: Negative for abdominal pain, blood in stool, melena and vomiting.  Musculoskeletal: Negative.   Skin: Negative.   Neurological: Positive for weakness.  Psychiatric/Behavioral: Negative.  The patient is not nervous/anxious.     As per HPI. Otherwise, a complete review of systems is negative.  PAST MEDICAL HISTORY: Past Medical History:  Diagnosis Date  . Anemia   . Arthritis   . 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 Diabetes mellitus without complication ([place])   . Ectatic abdominal aorta ([place]) 08/12/2015  . Esophageal cancer ([place])   . GI bleed   . Hearing loss    left ear  . Hypertension   .  Hypothyroidism   . Liver mass   . Mild left atrial enlargement 08/12/2015   Very mild; LA 4.2 cm; echo March 2017  . Mitral valvular regurgitation 08/12/2015  . Sleep apnea   . Stroke ([place])   . TIA (transient ischemic attack)   . 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 APPENDECTOMY    . EUS N/A 10/07/2015   Procedure: FULL UPPER ENDOSCOPIC ULTRASOUND (EUS) RADIAL;  Surgeon: Holly Bodily, MD;  Location: [facility] ENDOSCOPY;  Service: Endoscopy;  Laterality: N/A;  . EYE SURGERY    . FRACTURE SURGERY    . NOSE SURGERY    . PERIPHERAL VASCULAR CATHETERIZATION N/A 10/26/2015   Procedure: Glori Luis Cath Insertion;  Surgeon: Katha Cabal, MD;  Location: [facility];  Service: Cardiovascular;  Laterality: N/A;    FAMILY HISTORY Family History  Problem Relation Age of Onset  . Hypertension Mother   . Lung cancer Father   . Hypertension Brother        ADVANCED DIRECTIVES:    HEALTH MAINTENANCE: Social History  Substance Use Topics  . Smoking status: Former Smoker    Packs/day: 1.00    Types: Cigarettes    Quit date: 10/03/1983  . Smokeless tobacco: Never Used  . Alcohol use 0.0 oz/week     Comment: rarely     Colonoscopy:  PAP:  Bone density:  Lipid panel:  Allergies  Allergen Reactions  . Sulfa Antibiotics Itching and Hives  . Amoxicillin Hives  . Ampicillin Hives  . Keflex [Cephalexin] Itching  .  Lisinopril Other (See Comments)  . Losartan Other (See Comments)  . Sulfamethoxazole-Trimethoprim Other (See Comments)  . Cephalosporins Hives    Current Outpatient Prescriptions  Medication Sig Dispense Refill  . aspirin EC 81 MG tablet Take 162 mg by mouth 2 (two) times daily.    . Biotin (BIOTIN 5000) 5 MG CAPS Take by mouth.    . Cholecalciferol (VITAMIN D3) 1000 UNITS CAPS Take 2,000 Units by mouth daily.    . Coenzyme Q10 (CO Q-10) 200 MG CAPS Take by  mouth.    . isosorbide mononitrate (IMDUR) 60 MG 24 hr tablet Take 1 tablet (60 mg total) by mouth daily. 30 tablet 0  . levothyroxine (SYNTHROID, LEVOTHROID) 100 MCG tablet Take 100 mcg by mouth every morning.    . lidocaine-prilocaine (EMLA) cream Apply 1 application topically as needed. Apply to port 1-2 hours prior to chemotherapy. Cover with plastic wrap. 30 g 2  . Lutein 40 MG CAPS Take by mouth.    . Magnesium Oxide 250 MG TABS Take 1 tablet (250 mg total) by mouth daily.  0  . metFORMIN (GLUCOPHAGE) 1000 MG tablet Take 2,000 mg by mouth daily.    . metoprolol tartrate (LOPRESSOR) 25 MG tablet Take 12.5 mg by mouth 2 (two) times daily.     . Misc Natural Products (BEE PROPOLIS PO) Take 1 tablet by mouth 2 (two) times daily.     . Multiple Vitamins-Minerals (MULTI FOR HIM 50+ PO) Take 1 tablet by mouth daily.    . Omega-3 Fatty Acids (FISH OIL) 1000 MG CAPS Take 1 capsule by mouth daily.    . ondansetron (ZOFRAN) 8 MG tablet Take 1 tablet (8 mg total) by mouth every 8 (eight) hours as needed for nausea or vomiting. 30 tablet 2  . prochlorperazine (COMPAZINE) 10 MG tablet Take 1 tablet (10 mg total) by mouth every 6 (six) hours as needed for nausea or vomiting. 30 tablet 2  . Red Yeast Rice 600 MG TABS Take 2 tablets by mouth at bedtime.     No current facility-administered medications for this visit.    Facility-Administered Medications Ordered in Other Visits  Medication Dose Route Frequency Provider Last Rate Last Dose  . dexamethasone (DECADRON) 10 mg in sodium chloride 0.9 % 50 mL IVPB  10 mg Intravenous Once Lloyd Huger, MD      . dextrose 5 % solution   Intravenous Once Lloyd Huger, MD      . fluorouracil (ADRUCIL) 4,200 mg in sodium chloride 0.9 % 66 mL chemo infusion  2,400 mg/m2 (Treatment Plan Recorded) Intravenous 1 day or 1 dose Lloyd Huger, MD      . fluorouracil (ADRUCIL) chemo injection 700 mg  400 mg/m2 (Treatment Plan Recorded) Intravenous Once  Lloyd Huger, MD      . leucovorin 700 mg in dextrose 5 % 250 mL infusion  700 mg Intravenous Once Lloyd Huger, MD      . oxaliplatin (ELOXATIN) 150 mg in dextrose 5 % 500 mL chemo infusion  85 mg/m2 (Treatment Plan Recorded) Intravenous Once Lloyd Huger, MD      . palonosetron (ALOXI) injection 0.25 mg  0.25 mg Intravenous Once Lloyd Huger, MD      . sodium chloride flush (NS) 0.9 % injection 10 mL  10 mL Intracatheter PRN Lloyd Huger, MD        OBJECTIVE: Vitals:   11/10/15 0917  Resp: 18  Temp: (!) 96.9 F (36.1  C)     Body mass index is 21.43 kg/m.    ECOG FS:1 - Symptomatic but completely ambulatory  General: Well-developed, well-nourished, no acute distress. Eyes: Pink conjunctiva, anicteric sclera. Lungs: Clear to auscultation bilaterally. Heart: Regular rate and rhythm. No rubs, murmurs, or gallops. Abdomen: Soft, nontender, nondistended. No organomegaly noted, normoactive bowel sounds. Musculoskeletal: No edema, cyanosis, or clubbing. Neuro: Alert, answering all questions appropriately. Cranial nerves grossly intact. Skin: No rashes or petechiae noted. Psych: Normal affect.   LAB RESULTS:  Lab Results  Component Value Date   NA 130 (L) 11/10/2015   K 4.1 11/10/2015   CL 95 (L) 11/10/2015   CO2 25 11/10/2015   GLUCOSE 128 (H) 11/10/2015   BUN 14 11/10/2015   CREATININE 0.57 (L) 11/10/2015   CALCIUM 9.1 11/10/2015   PROT 7.4 11/10/2015   ALBUMIN 3.9 11/10/2015   AST 23 11/10/2015   ALT 12 (L) 11/10/2015   ALKPHOS 53 11/10/2015   BILITOT 0.4 11/10/2015   GFRNONAA >60 11/10/2015   GFRAA >60 11/10/2015    Lab Results  Component Value Date   WBC 7.6 11/10/2015   NEUTROABS 5.6 11/10/2015   HGB 7.7 (L) 11/10/2015   HCT 22.9 (L) 11/10/2015   MCV 79.1 (L) 11/10/2015   PLT 540 (H) 11/10/2015   Lab Results  Component Value Date   IRON 30 (L) 09/29/2015   TIBC 355 09/29/2015   IRONPCTSAT 9 (L) 09/29/2015    Lab Results    Component Value Date   FERRITIN 72 09/29/2015     STUDIES: Nm Pet Image Initial (pi) Skull Base To Thigh  Result Date: 10/11/2015 CLINICAL DATA:  Initial treatment strategy for esophageal thickening with metastases. EXAM: NUCLEAR MEDICINE PET SKULL BASE TO THIGH TECHNIQUE: 13.4 mCi F-18 FDG was injected intravenously. Full-ring PET imaging was performed from the skull base to thigh after the radiotracer. CT data was obtained and used for attenuation correction and anatomic localization. FASTING BLOOD GLUCOSE:  Value: 103 mg/dl COMPARISON:  MR abdomen 09/25/2015 and CT chest 10/04/2014. FINDINGS: NECK There is asymmetric hypermetabolism in the right occipital lobe, nonspecific. No hypermetabolic lymph nodes in the neck. CT images show no acute findings. CHEST Hypermetabolic distal esophageal mass has an SUV max of 17.1. Hypermetabolic mediastinal and subcarinal lymph nodes measure up to 1.8 cm in the subcarinal station, with an SUV max of 8.6. Multiple tiny hypermetabolic pulmonary nodules are seen with an index right perihilar nodule measuring 9 mm (image 105) with an SUV max of 7.1. Atherosclerotic calcification of the aorta and coronary arteries. No pericardial or pleural effusion. ABDOMEN/PELVIS There are 2 areas of abnormal hypermetabolism in the liver. Both are seen peripherally in the right hepatic lobe with an index ill-defined low-attenuation lesion inferiorly, measuring 2.0 cm with an SUV max of 9.7. 1.8 cm left adrenal nodule has an SUV max of 8.3. Conglomerate gastrohepatic ligament adenopathy measures approximately 4.8 x 6.3 cm with an SUV max of 9.2. Mildly hypermetabolic lymph node/nodule inferior to the stomach and along the posterior margin of the pancreatic tail measures 12 mm (CT image 139) with an SUV max of 4.9. A left inguinal hernia contains hypermetabolic soft tissue, measuring approximately 12 mm with an SUV max of 4.9. Liver, gallbladder, right adrenal gland, kidneys, spleen,  pancreas, stomach and bowel are otherwise grossly unremarkable. Aortic atherosclerosis. SKELETON Focal hypermetabolism is seen within the inferior right scapula, SUV max 4.0, without a definite CT correlate. Abnormal hypermetabolism is seen in association with the right proximal  posterior thigh musculature, with an SUV max of 4.0, corresponding to a 2.4 cm intramuscular low-attenuation lesion (CT image 272). IMPRESSION: 1. Distal esophageal carcinoma with metastatic disease involving the mediastinum, lungs, liver, left adrenal gland, upper abdominal lymph nodes, posterior right thigh musculature and possibly right scapula. 2. Hypermetabolic soft tissue within the left inguinal canal, possibly metastatic. 3. Mild asymmetric hypermetabolism in the right occipital lobe, nonspecific. Metastatic disease cannot be excluded. 4. Aortic atherosclerosis and coronary artery calcification. Electronically Signed   By: Lorin Picket M.D.   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  PLAN:    1. 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 Biopsy confirmed adenocarcinoma consistent with esophageal origin. Proceed with cycle 2 of FOLFOX today. HER-2 testing was found to be positive, therefore will get MUGA scan to assess patient's cardiac status and then add Herceptin for all subsequent cycles. Continue daily XRT for palliation of his difficulty swallowing.  Return to clinic in 2 days for pump removal and one unit of packed red blood cells and then in 2 weeks for consideration of cycle 2.  Will reimage after 6 cycles.  2. Iron deficiency anemia: Patient's hemoglobin and iron stores have trended down. Because patient is symptomatic, will give 1 unit of blood on Friday in conjunction with his pump removal.  3. Recent MI:  Treatment per cardiology. MUGA as above. 4. Weight loss: Likely secondary to underlying malignancy, monitor. 5. Dysphagia: XRT as above.  Patient expressed understanding and was in agreement with this plan. He also understands that He can call clinic at any time with any questions, concerns, or complaints.    Lloyd Huger, MD   11/10/2015 10:10 AM

## 2015-11-10 ENCOUNTER — Inpatient Hospital Stay: Payer: Medicare Other

## 2015-11-10 ENCOUNTER — Ambulatory Visit
Admission: RE | Admit: 2015-11-10 | Discharge: 2015-11-10 | Disposition: A | Discharge status: Home / Self Care | Payer: Medicare Other | Source: Ambulatory Visit | Attending: Radiation Oncology | Admitting: Radiation Oncology

## 2015-11-10 ENCOUNTER — Inpatient Hospital Stay (HOSPITAL_BASED_OUTPATIENT_CLINIC_OR_DEPARTMENT_OTHER): Payer: Medicare Other | Admitting: Oncology

## 2015-11-10 VITALS — BP 126/61 | HR 77 | Temp 96.9°F | Resp 18 | Wt 136.8 lb

## 2015-11-10 DIAGNOSIS — R5383 Other fatigue: Secondary | ICD-10-CM

## 2015-11-10 DIAGNOSIS — C7801 Secondary malignant neoplasm of right lung: Secondary | ICD-10-CM | POA: Diagnosis not present

## 2015-11-10 DIAGNOSIS — G4730 Sleep apnea, unspecified: Secondary | ICD-10-CM

## 2015-11-10 DIAGNOSIS — I252 Old myocardial infarction: Secondary | ICD-10-CM | POA: Diagnosis not present

## 2015-11-10 DIAGNOSIS — I77811 Abdominal aortic ectasia: Secondary | ICD-10-CM

## 2015-11-10 DIAGNOSIS — C155 Malignant neoplasm of lower third of esophagus: Secondary | ICD-10-CM | POA: Diagnosis not present

## 2015-11-10 DIAGNOSIS — Z510 Encounter for antineoplastic radiation therapy: Secondary | ICD-10-CM | POA: Diagnosis not present

## 2015-11-10 DIAGNOSIS — Z79899 Other long term (current) drug therapy: Secondary | ICD-10-CM

## 2015-11-10 DIAGNOSIS — R634 Abnormal weight loss: Secondary | ICD-10-CM | POA: Diagnosis not present

## 2015-11-10 DIAGNOSIS — Z7984 Long term (current) use of oral hypoglycemic drugs: Secondary | ICD-10-CM

## 2015-11-10 DIAGNOSIS — C781 Secondary malignant neoplasm of mediastinum: Secondary | ICD-10-CM | POA: Diagnosis not present

## 2015-11-10 DIAGNOSIS — Z8673 Personal history of transient ischemic attack (TIA), and cerebral infarction without residual deficits: Secondary | ICD-10-CM

## 2015-11-10 DIAGNOSIS — I340 Nonrheumatic mitral (valve) insufficiency: Secondary | ICD-10-CM

## 2015-11-10 DIAGNOSIS — E119 Type 2 diabetes mellitus without complications: Secondary | ICD-10-CM

## 2015-11-10 DIAGNOSIS — E039 Hypothyroidism, unspecified: Secondary | ICD-10-CM

## 2015-11-10 DIAGNOSIS — Z801 Family history of malignant neoplasm of trachea, bronchus and lung: Secondary | ICD-10-CM

## 2015-11-10 DIAGNOSIS — R1310 Dysphagia, unspecified: Secondary | ICD-10-CM

## 2015-11-10 DIAGNOSIS — Q2546 Tortuous aortic arch: Secondary | ICD-10-CM

## 2015-11-10 DIAGNOSIS — D509 Iron deficiency anemia, unspecified: Secondary | ICD-10-CM

## 2015-11-10 DIAGNOSIS — C787 Secondary malignant neoplasm of liver and intrahepatic bile duct: Secondary | ICD-10-CM | POA: Diagnosis not present

## 2015-11-10 DIAGNOSIS — I10 Essential (primary) hypertension: Secondary | ICD-10-CM

## 2015-11-10 DIAGNOSIS — M5134 Other intervertebral disc degeneration, thoracic region: Secondary | ICD-10-CM

## 2015-11-10 DIAGNOSIS — M129 Arthropathy, unspecified: Secondary | ICD-10-CM

## 2015-11-10 DIAGNOSIS — Z7982 Long term (current) use of aspirin: Secondary | ICD-10-CM

## 2015-11-10 DIAGNOSIS — Z5111 Encounter for antineoplastic chemotherapy: Secondary | ICD-10-CM | POA: Diagnosis not present

## 2015-11-10 DIAGNOSIS — R112 Nausea with vomiting, unspecified: Secondary | ICD-10-CM

## 2015-11-10 DIAGNOSIS — C7802 Secondary malignant neoplasm of left lung: Secondary | ICD-10-CM | POA: Diagnosis not present

## 2015-11-10 DIAGNOSIS — Z87891 Personal history of nicotine dependence: Secondary | ICD-10-CM

## 2015-11-10 DIAGNOSIS — R531 Weakness: Secondary | ICD-10-CM

## 2015-11-10 LAB — COMPREHENSIVE METABOLIC PANEL
ALBUMIN: 3.9 g/dL (ref 3.5–5.0)
ALK PHOS: 53 U/L (ref 38–126)
ALT: 12 U/L — ABNORMAL LOW (ref 17–63)
ANION GAP: 10 (ref 5–15)
AST: 23 U/L (ref 15–41)
BILIRUBIN TOTAL: 0.4 mg/dL (ref 0.3–1.2)
BUN: 14 mg/dL (ref 6–20)
CALCIUM: 9.1 mg/dL (ref 8.9–10.3)
CO2: 25 mmol/L (ref 22–32)
CREATININE: 0.57 mg/dL — AB (ref 0.61–1.24)
Chloride: 95 mmol/L — ABNORMAL LOW (ref 101–111)
GFR calc Af Amer: >60 mL/min (ref >60)
GFR calc non Af Amer: >60 mL/min (ref >60)
GLUCOSE: 128 mg/dL — AB (ref 65–99)
Potassium: 4.1 mmol/L (ref 3.5–5.1)
Sodium: 130 mmol/L — ABNORMAL LOW (ref 135–145)
TOTAL PROTEIN: 7.4 g/dL (ref 6.5–8.1)

## 2015-11-10 LAB — CBC WITH DIFFERENTIAL/PLATELET
BASOS PCT: 1 %
Basophils Absolute: 0.1 10*3/uL (ref 0–0.1)
Eosinophils Absolute: 0.1 10*3/uL (ref 0–0.7)
Eosinophils Relative: 1 %
HEMATOCRIT: 22.9 % — AB (ref 40.0–52.0)
HEMOGLOBIN: 7.7 g/dL — AB (ref 13.0–18.0)
Lymphocytes Relative: 11 %
Lymphs Abs: 0.9 10*3/uL — ABNORMAL LOW (ref 1.0–3.6)
MCH: 26.5 pg (ref 26.0–34.0)
MCHC: 33.5 g/dL (ref 32.0–36.0)
MCV: 79.1 fL — ABNORMAL LOW (ref 80.0–100.0)
Monocytes Absolute: 1 10*3/uL (ref 0.2–1.0)
Monocytes Relative: 14 %
NEUTROS ABS: 5.6 10*3/uL (ref 1.4–6.5)
NEUTROS PCT: 73 %
Platelets: 540 10*3/uL — ABNORMAL HIGH (ref 150–440)
RBC: 2.9 MIL/uL — ABNORMAL LOW (ref 4.40–5.90)
RDW: 18 % — ABNORMAL HIGH (ref 11.5–14.5)
WBC: 7.6 10*3/uL (ref 3.8–10.6)

## 2015-11-10 MED ORDER — DEXTROSE 5 % IV SOLN
Freq: Once | INTRAVENOUS | Status: AC
  Administered 2015-11-10: 11:00:00 via INTRAVENOUS
  Filled 2015-11-10: qty 1000

## 2015-11-10 MED ORDER — PALONOSETRON HCL INJECTION 0.25 MG/5ML
0.2500 mg | Freq: Once | INTRAVENOUS | Status: AC
  Administered 2015-11-10: 0.25 mg via INTRAVENOUS
  Filled 2015-11-10: qty 5

## 2015-11-10 MED ORDER — DEXTROSE 5 % IV SOLN
85.0000 mg/m2 | Freq: Once | INTRAVENOUS | Status: AC
  Administered 2015-11-10: 150 mg via INTRAVENOUS
  Filled 2015-11-10: qty 10

## 2015-11-10 MED ORDER — SODIUM CHLORIDE 0.9% FLUSH
10.0000 mL | INTRAVENOUS | Status: DC | PRN
  Administered 2015-11-10: 10 mL
  Filled 2015-11-10: qty 10

## 2015-11-10 MED ORDER — SODIUM CHLORIDE 0.9 % IV SOLN
10.0000 mg | Freq: Once | INTRAVENOUS | Status: AC
  Administered 2015-11-10: 10 mg via INTRAVENOUS
  Filled 2015-11-10: qty 1

## 2015-11-10 MED ORDER — FLUOROURACIL CHEMO INJECTION 2.5 GM/50ML
400.0000 mg/m2 | Freq: Once | INTRAVENOUS | Status: AC
  Administered 2015-11-10: 700 mg via INTRAVENOUS
  Filled 2015-11-10: qty 14

## 2015-11-10 MED ORDER — LEUCOVORIN CALCIUM INJECTION 350 MG
700.0000 mg | Freq: Once | INTRAVENOUS | Status: AC
  Administered 2015-11-10: 700 mg via INTRAVENOUS
  Filled 2015-11-10: qty 35

## 2015-11-10 MED ORDER — SODIUM CHLORIDE 0.9 % IV SOLN
2400.0000 mg/m2 | INTRAVENOUS | Status: DC
  Administered 2015-11-10: 4200 mg via INTRAVENOUS
  Filled 2015-11-10: qty 84

## 2015-11-10 NOTE — Progress Notes (Signed)
States is feeling tired today. Tolerated last treatment well. Per pt's spouse, he is not eating enough.

## 2015-11-11 ENCOUNTER — Ambulatory Visit
Admission: RE | Admit: 2015-11-11 | Discharge: 2015-11-11 | Disposition: A | Discharge status: Home / Self Care | Payer: Medicare Other | Source: Ambulatory Visit | Attending: Radiation Oncology | Admitting: Radiation Oncology

## 2015-11-11 DIAGNOSIS — C781 Secondary malignant neoplasm of mediastinum: Secondary | ICD-10-CM | POA: Diagnosis not present

## 2015-11-11 DIAGNOSIS — C155 Malignant neoplasm of lower third of esophagus: Secondary | ICD-10-CM | POA: Diagnosis not present

## 2015-11-11 DIAGNOSIS — C7802 Secondary malignant neoplasm of left lung: Secondary | ICD-10-CM | POA: Diagnosis not present

## 2015-11-11 DIAGNOSIS — C787 Secondary malignant neoplasm of liver and intrahepatic bile duct: Secondary | ICD-10-CM | POA: Diagnosis not present

## 2015-11-11 DIAGNOSIS — Z510 Encounter for antineoplastic radiation therapy: Secondary | ICD-10-CM | POA: Diagnosis not present

## 2015-11-11 DIAGNOSIS — C7801 Secondary malignant neoplasm of right lung: Secondary | ICD-10-CM | POA: Diagnosis not present

## 2015-11-11 LAB — PREPARE RBC (CROSSMATCH)

## 2015-11-11 LAB — T4: T4, Total: 8.5 ug/dL (ref 4.5–12.0)

## 2015-11-11 LAB — THYROID PANEL WITH TSH
Free Thyroxine Index: 2.1 (ref 1.2–4.9)
T3 UPTAKE RATIO: 23 % — AB (ref 24–39)
T4 TOTAL: 9.1 ug/dL (ref 4.5–12.0)
TSH: 5.73 u[IU]/mL — AB (ref 0.450–4.500)

## 2015-11-11 LAB — CANCER ANTIGEN 19-9: CA 19 9: 77727 U/mL — AB (ref 0–35)

## 2015-11-11 LAB — CEA: CEA: 951.6 ng/mL — AB (ref 0.0–4.7)

## 2015-11-11 NOTE — Addendum Note (Signed)
Addended by: Lorrine Kin A on: 11/11/2015 04:07 PM   Modules accepted: Orders

## 2015-11-12 ENCOUNTER — Inpatient Hospital Stay: Payer: Medicare Other

## 2015-11-12 ENCOUNTER — Other Ambulatory Visit: Payer: Medicare Other

## 2015-11-12 ENCOUNTER — Ambulatory Visit
Admission: RE | Admit: 2015-11-12 | Discharge: 2015-11-12 | Disposition: A | Discharge status: Home / Self Care | Payer: Medicare Other | Source: Ambulatory Visit | Attending: Radiation Oncology | Admitting: Radiation Oncology

## 2015-11-12 ENCOUNTER — Ambulatory Visit: Payer: Medicare Other

## 2015-11-12 VITALS — BP 128/66 | HR 76 | Temp 97.0°F | Resp 20

## 2015-11-12 DIAGNOSIS — Z510 Encounter for antineoplastic radiation therapy: Secondary | ICD-10-CM | POA: Diagnosis not present

## 2015-11-12 DIAGNOSIS — C787 Secondary malignant neoplasm of liver and intrahepatic bile duct: Secondary | ICD-10-CM | POA: Diagnosis not present

## 2015-11-12 DIAGNOSIS — Z5111 Encounter for antineoplastic chemotherapy: Secondary | ICD-10-CM | POA: Diagnosis not present

## 2015-11-12 DIAGNOSIS — C7801 Secondary malignant neoplasm of right lung: Secondary | ICD-10-CM | POA: Diagnosis not present

## 2015-11-12 DIAGNOSIS — C155 Malignant neoplasm of lower third of esophagus: Secondary | ICD-10-CM | POA: Diagnosis not present

## 2015-11-12 DIAGNOSIS — R634 Abnormal weight loss: Secondary | ICD-10-CM | POA: Diagnosis not present

## 2015-11-12 DIAGNOSIS — I252 Old myocardial infarction: Secondary | ICD-10-CM | POA: Diagnosis not present

## 2015-11-12 DIAGNOSIS — C7802 Secondary malignant neoplasm of left lung: Secondary | ICD-10-CM | POA: Diagnosis not present

## 2015-11-12 DIAGNOSIS — C781 Secondary malignant neoplasm of mediastinum: Secondary | ICD-10-CM | POA: Diagnosis not present

## 2015-11-12 DIAGNOSIS — D509 Iron deficiency anemia, unspecified: Secondary | ICD-10-CM | POA: Diagnosis not present

## 2015-11-12 MED ORDER — DIPHENHYDRAMINE HCL 50 MG/ML IJ SOLN
25.0000 mg | Freq: Once | INTRAMUSCULAR | Status: AC
  Administered 2015-11-12: 25 mg via INTRAVENOUS

## 2015-11-12 MED ORDER — SODIUM CHLORIDE 0.9 % IV SOLN
INTRAVENOUS | Status: DC
  Administered 2015-11-12: 14:00:00 via INTRAVENOUS
  Filled 2015-11-12: qty 1000

## 2015-11-12 MED ORDER — ACETAMINOPHEN 325 MG PO TABS
650.0000 mg | ORAL_TABLET | Freq: Once | ORAL | Status: AC
  Administered 2015-11-12: 650 mg via ORAL

## 2015-11-12 MED ORDER — HEPARIN SOD (PORK) LOCK FLUSH 100 UNIT/ML IV SOLN
500.0000 [IU] | Freq: Once | INTRAVENOUS | Status: AC | PRN
  Administered 2015-11-12: 500 [IU]

## 2015-11-13 LAB — TYPE AND SCREEN
ABO/RH(D): A POS
Antibody Screen: NEGATIVE
UNIT DIVISION: 0

## 2015-11-15 ENCOUNTER — Ambulatory Visit
Admission: RE | Admit: 2015-11-15 | Discharge: 2015-11-15 | Disposition: A | Discharge status: Home / Self Care | Payer: Medicare Other | Source: Ambulatory Visit | Attending: Radiation Oncology | Admitting: Radiation Oncology

## 2015-11-15 DIAGNOSIS — M5134 Other intervertebral disc degeneration, thoracic region: Secondary | ICD-10-CM | POA: Diagnosis not present

## 2015-11-15 DIAGNOSIS — R634 Abnormal weight loss: Secondary | ICD-10-CM | POA: Diagnosis not present

## 2015-11-15 DIAGNOSIS — Z8673 Personal history of transient ischemic attack (TIA), and cerebral infarction without residual deficits: Secondary | ICD-10-CM | POA: Diagnosis not present

## 2015-11-15 DIAGNOSIS — Z510 Encounter for antineoplastic radiation therapy: Secondary | ICD-10-CM | POA: Diagnosis not present

## 2015-11-15 DIAGNOSIS — E119 Type 2 diabetes mellitus without complications: Secondary | ICD-10-CM | POA: Diagnosis not present

## 2015-11-15 DIAGNOSIS — I10 Essential (primary) hypertension: Secondary | ICD-10-CM | POA: Diagnosis not present

## 2015-11-15 DIAGNOSIS — C7972 Secondary malignant neoplasm of left adrenal gland: Secondary | ICD-10-CM | POA: Diagnosis not present

## 2015-11-15 DIAGNOSIS — I340 Nonrheumatic mitral (valve) insufficiency: Secondary | ICD-10-CM | POA: Diagnosis not present

## 2015-11-15 DIAGNOSIS — C7802 Secondary malignant neoplasm of left lung: Secondary | ICD-10-CM | POA: Diagnosis not present

## 2015-11-15 DIAGNOSIS — E039 Hypothyroidism, unspecified: Secondary | ICD-10-CM | POA: Diagnosis not present

## 2015-11-15 DIAGNOSIS — M129 Arthropathy, unspecified: Secondary | ICD-10-CM | POA: Diagnosis not present

## 2015-11-15 DIAGNOSIS — C155 Malignant neoplasm of lower third of esophagus: Secondary | ICD-10-CM | POA: Diagnosis not present

## 2015-11-15 DIAGNOSIS — C781 Secondary malignant neoplasm of mediastinum: Secondary | ICD-10-CM | POA: Diagnosis not present

## 2015-11-15 DIAGNOSIS — Z87891 Personal history of nicotine dependence: Secondary | ICD-10-CM | POA: Diagnosis not present

## 2015-11-15 DIAGNOSIS — D649 Anemia, unspecified: Secondary | ICD-10-CM | POA: Diagnosis not present

## 2015-11-15 DIAGNOSIS — G4730 Sleep apnea, unspecified: Secondary | ICD-10-CM | POA: Diagnosis not present

## 2015-11-15 DIAGNOSIS — I77811 Abdominal aortic ectasia: Secondary | ICD-10-CM | POA: Diagnosis not present

## 2015-11-15 DIAGNOSIS — C787 Secondary malignant neoplasm of liver and intrahepatic bile duct: Secondary | ICD-10-CM | POA: Diagnosis not present

## 2015-11-15 DIAGNOSIS — R1310 Dysphagia, unspecified: Secondary | ICD-10-CM | POA: Diagnosis not present

## 2015-11-15 DIAGNOSIS — C7801 Secondary malignant neoplasm of right lung: Secondary | ICD-10-CM | POA: Diagnosis not present

## 2015-11-16 ENCOUNTER — Other Ambulatory Visit: Payer: Self-pay | Admitting: *Deleted

## 2015-11-16 ENCOUNTER — Ambulatory Visit
Admission: RE | Admit: 2015-11-16 | Discharge: 2015-11-16 | Disposition: A | Discharge status: Home / Self Care | Payer: Medicare Other | Source: Ambulatory Visit | Attending: Radiation Oncology | Admitting: Radiation Oncology

## 2015-11-16 DIAGNOSIS — Z510 Encounter for antineoplastic radiation therapy: Secondary | ICD-10-CM | POA: Diagnosis not present

## 2015-11-16 DIAGNOSIS — C781 Secondary malignant neoplasm of mediastinum: Secondary | ICD-10-CM | POA: Diagnosis not present

## 2015-11-16 DIAGNOSIS — C155 Malignant neoplasm of lower third of esophagus: Secondary | ICD-10-CM | POA: Diagnosis not present

## 2015-11-16 DIAGNOSIS — C7801 Secondary malignant neoplasm of right lung: Secondary | ICD-10-CM | POA: Diagnosis not present

## 2015-11-16 DIAGNOSIS — C787 Secondary malignant neoplasm of liver and intrahepatic bile duct: Secondary | ICD-10-CM | POA: Diagnosis not present

## 2015-11-16 DIAGNOSIS — C7802 Secondary malignant neoplasm of left lung: Secondary | ICD-10-CM | POA: Diagnosis not present

## 2015-11-16 MED ORDER — SUCRALFATE 1 G PO TABS: 1.0000 g | ORAL_TABLET | Freq: Three times a day (TID) | ORAL | 2 refills | Status: DC

## 2015-11-17 ENCOUNTER — Ambulatory Visit
Admission: RE | Admit: 2015-11-17 | Discharge: 2015-11-17 | Disposition: A | Discharge status: Home / Self Care | Payer: Medicare Other | Source: Ambulatory Visit | Attending: Radiation Oncology | Admitting: Radiation Oncology

## 2015-11-17 ENCOUNTER — Ambulatory Visit
Admission: RE | Admit: 2015-11-17 | Discharge: 2015-11-17 | Disposition: A | Discharge status: Home / Self Care | Payer: Medicare Other | Source: Ambulatory Visit | Attending: Oncology | Admitting: Oncology

## 2015-11-17 DIAGNOSIS — C155 Malignant neoplasm of lower third of esophagus: Secondary | ICD-10-CM | POA: Diagnosis not present

## 2015-11-17 DIAGNOSIS — C781 Secondary malignant neoplasm of mediastinum: Secondary | ICD-10-CM | POA: Diagnosis not present

## 2015-11-17 DIAGNOSIS — C7801 Secondary malignant neoplasm of right lung: Secondary | ICD-10-CM | POA: Diagnosis not present

## 2015-11-17 DIAGNOSIS — C787 Secondary malignant neoplasm of liver and intrahepatic bile duct: Secondary | ICD-10-CM | POA: Diagnosis not present

## 2015-11-17 DIAGNOSIS — C7802 Secondary malignant neoplasm of left lung: Secondary | ICD-10-CM | POA: Diagnosis not present

## 2015-11-17 DIAGNOSIS — T451X5A Adverse effect of antineoplastic and immunosuppressive drugs, initial encounter: Secondary | ICD-10-CM | POA: Diagnosis not present

## 2015-11-17 DIAGNOSIS — Z510 Encounter for antineoplastic radiation therapy: Secondary | ICD-10-CM | POA: Diagnosis not present

## 2015-11-17 MED ORDER — TECHNETIUM TC 99M-LABELED RED BLOOD CELLS IV KIT
22.0000 | PACK | Freq: Once | INTRAVENOUS | Status: AC | PRN
  Administered 2015-11-17: 20.05 via INTRAVENOUS

## 2015-11-18 ENCOUNTER — Ambulatory Visit
Admission: RE | Admit: 2015-11-18 | Discharge: 2015-11-18 | Disposition: A | Discharge status: Home / Self Care | Payer: Medicare Other | Source: Ambulatory Visit | Attending: Radiation Oncology | Admitting: Radiation Oncology

## 2015-11-18 DIAGNOSIS — Z510 Encounter for antineoplastic radiation therapy: Secondary | ICD-10-CM | POA: Diagnosis not present

## 2015-11-18 DIAGNOSIS — C155 Malignant neoplasm of lower third of esophagus: Secondary | ICD-10-CM | POA: Diagnosis not present

## 2015-11-18 DIAGNOSIS — C781 Secondary malignant neoplasm of mediastinum: Secondary | ICD-10-CM | POA: Diagnosis not present

## 2015-11-18 DIAGNOSIS — C787 Secondary malignant neoplasm of liver and intrahepatic bile duct: Secondary | ICD-10-CM | POA: Diagnosis not present

## 2015-11-18 DIAGNOSIS — C7802 Secondary malignant neoplasm of left lung: Secondary | ICD-10-CM | POA: Diagnosis not present

## 2015-11-18 DIAGNOSIS — C7801 Secondary malignant neoplasm of right lung: Secondary | ICD-10-CM | POA: Diagnosis not present

## 2015-11-19 ENCOUNTER — Ambulatory Visit
Admission: RE | Admit: 2015-11-19 | Discharge: 2015-11-19 | Disposition: A | Discharge status: Home / Self Care | Payer: Medicare Other | Source: Ambulatory Visit | Attending: Radiation Oncology | Admitting: Radiation Oncology

## 2015-11-19 DIAGNOSIS — C7801 Secondary malignant neoplasm of right lung: Secondary | ICD-10-CM | POA: Diagnosis not present

## 2015-11-19 DIAGNOSIS — C781 Secondary malignant neoplasm of mediastinum: Secondary | ICD-10-CM | POA: Diagnosis not present

## 2015-11-19 DIAGNOSIS — C155 Malignant neoplasm of lower third of esophagus: Secondary | ICD-10-CM | POA: Diagnosis not present

## 2015-11-19 DIAGNOSIS — C7802 Secondary malignant neoplasm of left lung: Secondary | ICD-10-CM | POA: Diagnosis not present

## 2015-11-19 DIAGNOSIS — Z510 Encounter for antineoplastic radiation therapy: Secondary | ICD-10-CM | POA: Diagnosis not present

## 2015-11-19 DIAGNOSIS — C787 Secondary malignant neoplasm of liver and intrahepatic bile duct: Secondary | ICD-10-CM | POA: Diagnosis not present

## 2015-11-19 NOTE — Telephone Encounter (Signed)
Opened in error

## 2015-11-22 ENCOUNTER — Ambulatory Visit
Admission: RE | Admit: 2015-11-22 | Discharge: 2015-11-22 | Disposition: A | Discharge status: Home / Self Care | Payer: Medicare Other | Source: Ambulatory Visit | Attending: Radiation Oncology | Admitting: Radiation Oncology

## 2015-11-22 DIAGNOSIS — C787 Secondary malignant neoplasm of liver and intrahepatic bile duct: Secondary | ICD-10-CM | POA: Diagnosis not present

## 2015-11-22 DIAGNOSIS — C781 Secondary malignant neoplasm of mediastinum: Secondary | ICD-10-CM | POA: Diagnosis not present

## 2015-11-22 DIAGNOSIS — Z510 Encounter for antineoplastic radiation therapy: Secondary | ICD-10-CM | POA: Diagnosis not present

## 2015-11-22 DIAGNOSIS — C155 Malignant neoplasm of lower third of esophagus: Secondary | ICD-10-CM | POA: Diagnosis not present

## 2015-11-22 DIAGNOSIS — C7802 Secondary malignant neoplasm of left lung: Secondary | ICD-10-CM | POA: Diagnosis not present

## 2015-11-22 DIAGNOSIS — C7801 Secondary malignant neoplasm of right lung: Secondary | ICD-10-CM | POA: Diagnosis not present

## 2015-11-22 DIAGNOSIS — Z87891 Personal history of nicotine dependence: Secondary | ICD-10-CM | POA: Diagnosis not present

## 2015-11-23 ENCOUNTER — Ambulatory Visit
Admission: RE | Admit: 2015-11-23 | Discharge: 2015-11-23 | Disposition: A | Discharge status: Home / Self Care | Payer: Medicare Other | Source: Ambulatory Visit | Attending: Radiation Oncology | Admitting: Radiation Oncology

## 2015-11-23 DIAGNOSIS — C155 Malignant neoplasm of lower third of esophagus: Secondary | ICD-10-CM | POA: Diagnosis not present

## 2015-11-23 DIAGNOSIS — Z510 Encounter for antineoplastic radiation therapy: Secondary | ICD-10-CM | POA: Diagnosis not present

## 2015-11-23 DIAGNOSIS — C781 Secondary malignant neoplasm of mediastinum: Secondary | ICD-10-CM | POA: Diagnosis not present

## 2015-11-23 DIAGNOSIS — C787 Secondary malignant neoplasm of liver and intrahepatic bile duct: Secondary | ICD-10-CM | POA: Diagnosis not present

## 2015-11-23 DIAGNOSIS — C7802 Secondary malignant neoplasm of left lung: Secondary | ICD-10-CM | POA: Diagnosis not present

## 2015-11-23 DIAGNOSIS — C7801 Secondary malignant neoplasm of right lung: Secondary | ICD-10-CM | POA: Diagnosis not present

## 2015-11-23 NOTE — Progress Notes (Signed)
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  INTERVAL HISTORY:  Patient returns to clinic today for further evaluation and consideration of cycle 3 of FOLFOX plus Herceptin.  He reports an improved appetite and swallowing, but still has some mild weight loss. His weakness and fatigue is still evident, but improved since receiving 1 unit of blood last week. He has no neurologic complaints. He denies any fevers. He denies any chest pain or shortness of breath. He denies any nausea, vomiting, constipation, or diarrhea. He denies any melena or hematochezia. Patient offers no further specific complaints.  REVIEW OF SYSTEMS:   Review of Systems  Constitutional: Positive for malaise/fatigue and weight loss. Negative for fever.  Respiratory: Negative.  Negative for cough and shortness of breath.   Cardiovascular: Negative.  Negative for chest pain.  Gastrointestinal: Negative for abdominal pain, blood in stool, melena and vomiting.  Musculoskeletal: Negative.   Skin: Negative.   Neurological: Positive for weakness.  Psychiatric/Behavioral: Negative.  The patient is not nervous/anxious.     As per HPI. Otherwise, a complete review of systems is negative.  PAST MEDICAL HISTORY: Past Medical History:  Diagnosis Date  . Anemia   . Arthritis   . 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 Diabetes mellitus without complication ([place])   . Ectatic abdominal aorta ([place]) 08/12/2015  . Esophageal cancer ([place])   . GI  bleed   . Hearing loss    left ear  . Hypertension   . Hypothyroidism   . Liver mass   . Mild left atrial enlargement 08/12/2015   Very mild; LA 4.2 cm; echo March 2017  . Mitral valvular regurgitation 08/12/2015  . Sleep apnea   . Stroke ([place])   . TIA (transient ischemic attack)   . 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 APPENDECTOMY    . EUS N/A 10/07/2015   Procedure: FULL UPPER ENDOSCOPIC ULTRASOUND (EUS) RADIAL;  Surgeon: Holly Bodily, MD;  Location: [facility] ENDOSCOPY;  Service: Endoscopy;  Laterality: N/A;  . EYE SURGERY    . FRACTURE SURGERY    . NOSE SURGERY    . PERIPHERAL VASCULAR CATHETERIZATION N/A 10/26/2015   Procedure: Glori Luis Cath Insertion;  Surgeon: Katha Cabal, MD;  Location: [facility];  Service: Cardiovascular;  Laterality: N/A;    FAMILY HISTORY Family History  Problem Relation Age of Onset  . Hypertension Mother   . Lung cancer Father   . Hypertension Brother        ADVANCED DIRECTIVES:    HEALTH MAINTENANCE: Social History  Substance Use Topics  . Smoking status: Former Smoker    Packs/day: 1.00    Types: Cigarettes    Quit date: 10/03/1983  . Smokeless tobacco: Never Used  . Alcohol use 0.0 oz/week     Comment: rarely     Colonoscopy:  PAP:  Bone density:  Lipid panel:  Allergies  Allergen Reactions  . Sulfa Antibiotics Itching and Hives  . Amoxicillin Hives  .  Ampicillin Hives  . Keflex [Cephalexin] Itching  . Lisinopril Other (See Comments)  . Losartan Other (See Comments)  . Sulfamethoxazole-Trimethoprim Other (See Comments)  . Cephalosporins Hives    Current Outpatient Prescriptions  Medication Sig Dispense Refill  . aspirin EC 81 MG tablet Take 162 mg by mouth 2 (two) times daily.    . Biotin (BIOTIN 5000) 5 MG CAPS Take by mouth.    . Cholecalciferol (VITAMIN D3) 1000 UNITS CAPS Take 2,000 Units by mouth  daily.    . Coenzyme Q10 (CO Q-10) 200 MG CAPS Take by mouth.    . isosorbide mononitrate (IMDUR) 60 MG 24 hr tablet Take 1 tablet (60 mg total) by mouth daily. 30 tablet 0  . levothyroxine (SYNTHROID, LEVOTHROID) 100 MCG tablet Take 100 mcg by mouth every morning.    . lidocaine-prilocaine (EMLA) cream Apply 1 application topically as needed. Apply to port 1-2 hours prior to chemotherapy. Cover with plastic wrap. 30 g 2  . Lutein 40 MG CAPS Take by mouth.    . Magnesium Oxide 250 MG TABS Take 1 tablet (250 mg total) by mouth daily.  0  . metFORMIN (GLUCOPHAGE) 1000 MG tablet Take 2,000 mg by mouth daily.    . metoprolol tartrate (LOPRESSOR) 25 MG tablet Take 12.5 mg by mouth 2 (two) times daily.     . Misc Natural Products (BEE PROPOLIS PO) Take 1 tablet by mouth 2 (two) times daily.     . Multiple Vitamins-Minerals (MULTI FOR HIM 50+ PO) Take 1 tablet by mouth daily.    . Omega-3 Fatty Acids (FISH OIL) 1000 MG CAPS Take 1 capsule by mouth daily.    . ondansetron (ZOFRAN) 8 MG tablet Take 1 tablet (8 mg total) by mouth every 8 (eight) hours as needed for nausea or vomiting. 30 tablet 2  . prochlorperazine (COMPAZINE) 10 MG tablet Take 1 tablet (10 mg total) by mouth every 6 (six) hours as needed for nausea or vomiting. 30 tablet 2  . Red Yeast Rice 600 MG TABS Take 2 tablets by mouth at bedtime.    . sucralfate (CARAFATE) 1 g tablet Take 1 tablet (1 g total) by mouth 3 (three) times daily. Dissolve each tablet in 2-3 tbsp warm water, swish and swallow. 90 tablet 2   No current facility-administered medications for this visit.    Facility-Administered Medications Ordered in Other Visits  Medication Dose Route Frequency Provider Last Rate Last Dose  . 0.9 %  sodium chloride infusion   Intravenous Once Lloyd Huger, MD      . acetaminophen (TYLENOL) tablet 650 mg  650 mg Oral Once Lloyd Huger, MD      . dexamethasone (DECADRON) 10 mg in sodium chloride 0.9 % 50 mL IVPB  10 mg  Intravenous Once Lloyd Huger, MD      . dextrose 5 % solution   Intravenous Once Lloyd Huger, MD      . diphenhydrAMINE (BENADRYL) capsule 25 mg  25 mg Oral Once Lloyd Huger, MD      . fluorouracil (ADRUCIL) 4,200 mg in sodium chloride 0.9 % 66 mL chemo infusion  2,400 mg/m2 (Treatment Plan Recorded) Intravenous 1 day or 1 dose Lloyd Huger, MD      . fluorouracil (ADRUCIL) chemo injection 700 mg  400 mg/m2 (Treatment Plan Recorded) Intravenous Once Lloyd Huger, MD      . heparin lock flush 100 unit/mL  500 Units Intracatheter Once PRN Kathlene November  Grayland Ormond, MD      . leucovorin 696 mg in dextrose 5 % 250 mL infusion  400 mg/m2 (Treatment Plan Recorded) Intravenous Once Lloyd Huger, MD      . oxaliplatin (ELOXATIN) 150 mg in dextrose 5 % 500 mL chemo infusion  85 mg/m2 (Treatment Plan Recorded) Intravenous Once Lloyd Huger, MD      . palonosetron (ALOXI) injection 0.25 mg  0.25 mg Intravenous Once Lloyd Huger, MD      . sodium chloride flush (NS) 0.9 % injection 10 mL  10 mL Intracatheter PRN Lloyd Huger, MD      . trastuzumab (HERCEPTIN) 252 mg in sodium chloride 0.9 % 250 mL chemo infusion  4 mg/kg (Treatment Plan Recorded) Intravenous Once Lloyd Huger, MD        OBJECTIVE: Vitals:   11/24/15 1032  BP: 124/64  Pulse: 66  Resp: 18  Temp: 97.4 F (36.3 C)     Body mass index is 20.96 kg/m.    ECOG FS:1 - Symptomatic but completely ambulatory  General: Well-developed, well-nourished, no acute distress. Eyes: Pink conjunctiva, anicteric sclera. Lungs: Clear to auscultation bilaterally. Heart: Regular rate and rhythm. No rubs, murmurs, or gallops. Abdomen: Soft, nontender, nondistended. No organomegaly noted, normoactive bowel sounds. Musculoskeletal: No edema, cyanosis, or clubbing. Neuro: Alert, answering all questions appropriately. Cranial nerves grossly intact. Skin: No rashes or petechiae noted. Psych: Normal  affect.   LAB RESULTS:  Lab Results  Component Value Date   NA 132 (L) 11/24/2015   K 4.1 11/24/2015   CL 95 (L) 11/24/2015   CO2 26 11/24/2015   GLUCOSE 123 (H) 11/24/2015   BUN 14 11/24/2015   CREATININE 0.58 (L) 11/24/2015   CALCIUM 9.0 11/24/2015   PROT 7.1 11/24/2015   ALBUMIN 3.7 11/24/2015   AST 28 11/24/2015   ALT 14 (L) 11/24/2015   ALKPHOS 60 11/24/2015   BILITOT 0.4 11/24/2015   GFRNONAA >60 11/24/2015   GFRAA >60 11/24/2015    Lab Results  Component Value Date   WBC 6.3 11/24/2015   NEUTROABS 4.8 11/24/2015   HGB 9.3 (L) 11/24/2015   HCT 28.0 (L) 11/24/2015   MCV 80.5 11/24/2015   PLT 383 11/24/2015   Lab Results  Component Value Date   IRON 30 (L) 09/29/2015   TIBC 355 09/29/2015   IRONPCTSAT 9 (L) 09/29/2015    Lab Results  Component Value Date   FERRITIN 72 09/29/2015     STUDIES: Nm Cardiac Muga Rest  Result Date: 11/22/2015 CLINICAL DATA:  Malignant neoplasm of the esophagus. EXAM: NUCLEAR MEDICINE CARDIAC BLOOD POOL IMAGING (MUGA) TECHNIQUE: Cardiac multi-gated acquisition was performed at rest following intravenous injection of Tc-7mlabeled red blood cells. RADIOPHARMACEUTICALS:  20.05 mCi Tc-945mDP in-vitro labeled red blood cells IV COMPARISON:  None. FINDINGS: The left ventricular ejection fraction is calculated equal 52%. No significant wall motion abnormality identified. IMPRESSION: 1. Left ventricular ejection fraction equals 52%. Electronically Signed   By: TaKerby Moors.D.   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  PLAN:    1. 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 Biopsy confirmed adenocarcinoma consistent with esophageal origin. MUGA scan on November 22, 2015 reported an EF of 52%. Proceed with cycle 3 of FOLFOX plus  Herceptin today.  Continue daily XRT for  palliation of his difficulty swallowing completing on December 17, 2015.  Return to clinic in 2 days for pump removal and then in 2 weeks for consideration of cycle 2.  Will reimage after 6 cycles.  2. Iron deficiency anemia: Patient's hemoglobin has improved with 1 unit of packed red blood cells. Monitor.   3. Recent MI: Treatment per cardiology. MUGA as above. 4. Weight loss: Likely secondary to underlying malignancy, monitor. 5. Dysphagia: XRT as above.  Patient expressed understanding and was in agreement with this plan. He also understands that He can call clinic at any time with any questions, concerns, or complaints.    Lloyd Huger, MD   11/24/2015 11:26 AM

## 2015-11-24 ENCOUNTER — Ambulatory Visit
Admission: RE | Admit: 2015-11-24 | Discharge: 2015-11-24 | Disposition: A | Discharge status: Home / Self Care | Payer: Medicare Other | Source: Ambulatory Visit | Attending: Radiation Oncology | Admitting: Radiation Oncology

## 2015-11-24 ENCOUNTER — Inpatient Hospital Stay: Payer: Medicare Other | Attending: Oncology

## 2015-11-24 ENCOUNTER — Inpatient Hospital Stay: Payer: Medicare Other

## 2015-11-24 ENCOUNTER — Inpatient Hospital Stay (HOSPITAL_BASED_OUTPATIENT_CLINIC_OR_DEPARTMENT_OTHER): Payer: Medicare Other | Admitting: Oncology

## 2015-11-24 VITALS — BP 124/64 | HR 66 | Temp 97.4°F | Resp 18 | Wt 133.8 lb

## 2015-11-24 DIAGNOSIS — Z7982 Long term (current) use of aspirin: Secondary | ICD-10-CM | POA: Diagnosis not present

## 2015-11-24 DIAGNOSIS — C787 Secondary malignant neoplasm of liver and intrahepatic bile duct: Secondary | ICD-10-CM | POA: Insufficient documentation

## 2015-11-24 DIAGNOSIS — Z5112 Encounter for antineoplastic immunotherapy: Secondary | ICD-10-CM | POA: Insufficient documentation

## 2015-11-24 DIAGNOSIS — M5134 Other intervertebral disc degeneration, thoracic region: Secondary | ICD-10-CM

## 2015-11-24 DIAGNOSIS — G4730 Sleep apnea, unspecified: Secondary | ICD-10-CM | POA: Insufficient documentation

## 2015-11-24 DIAGNOSIS — Z801 Family history of malignant neoplasm of trachea, bronchus and lung: Secondary | ICD-10-CM | POA: Insufficient documentation

## 2015-11-24 DIAGNOSIS — R634 Abnormal weight loss: Secondary | ICD-10-CM | POA: Insufficient documentation

## 2015-11-24 DIAGNOSIS — Z87891 Personal history of nicotine dependence: Secondary | ICD-10-CM

## 2015-11-24 DIAGNOSIS — M129 Arthropathy, unspecified: Secondary | ICD-10-CM | POA: Diagnosis not present

## 2015-11-24 DIAGNOSIS — Z8673 Personal history of transient ischemic attack (TIA), and cerebral infarction without residual deficits: Secondary | ICD-10-CM

## 2015-11-24 DIAGNOSIS — Z5111 Encounter for antineoplastic chemotherapy: Secondary | ICD-10-CM | POA: Insufficient documentation

## 2015-11-24 DIAGNOSIS — I340 Nonrheumatic mitral (valve) insufficiency: Secondary | ICD-10-CM

## 2015-11-24 DIAGNOSIS — Z7984 Long term (current) use of oral hypoglycemic drugs: Secondary | ICD-10-CM

## 2015-11-24 DIAGNOSIS — I10 Essential (primary) hypertension: Secondary | ICD-10-CM | POA: Insufficient documentation

## 2015-11-24 DIAGNOSIS — C155 Malignant neoplasm of lower third of esophagus: Secondary | ICD-10-CM | POA: Insufficient documentation

## 2015-11-24 DIAGNOSIS — E039 Hypothyroidism, unspecified: Secondary | ICD-10-CM

## 2015-11-24 DIAGNOSIS — C7802 Secondary malignant neoplasm of left lung: Secondary | ICD-10-CM | POA: Diagnosis not present

## 2015-11-24 DIAGNOSIS — R1310 Dysphagia, unspecified: Secondary | ICD-10-CM | POA: Insufficient documentation

## 2015-11-24 DIAGNOSIS — D509 Iron deficiency anemia, unspecified: Secondary | ICD-10-CM

## 2015-11-24 DIAGNOSIS — C7801 Secondary malignant neoplasm of right lung: Secondary | ICD-10-CM | POA: Diagnosis not present

## 2015-11-24 DIAGNOSIS — R5383 Other fatigue: Secondary | ICD-10-CM | POA: Diagnosis not present

## 2015-11-24 DIAGNOSIS — E119 Type 2 diabetes mellitus without complications: Secondary | ICD-10-CM | POA: Diagnosis not present

## 2015-11-24 DIAGNOSIS — R531 Weakness: Secondary | ICD-10-CM | POA: Insufficient documentation

## 2015-11-24 DIAGNOSIS — Z510 Encounter for antineoplastic radiation therapy: Secondary | ICD-10-CM | POA: Diagnosis not present

## 2015-11-24 DIAGNOSIS — D649 Anemia, unspecified: Secondary | ICD-10-CM

## 2015-11-24 DIAGNOSIS — C781 Secondary malignant neoplasm of mediastinum: Secondary | ICD-10-CM | POA: Diagnosis not present

## 2015-11-24 LAB — CBC WITH DIFFERENTIAL/PLATELET
Basophils Absolute: 0 10*3/uL (ref 0–0.1)
Basophils Relative: 0 %
EOS PCT: 1 %
Eosinophils Absolute: 0.1 10*3/uL (ref 0–0.7)
HCT: 28 % — ABNORMAL LOW (ref 40.0–52.0)
Hemoglobin: 9.3 g/dL — ABNORMAL LOW (ref 13.0–18.0)
LYMPHS ABS: 0.4 10*3/uL — AB (ref 1.0–3.6)
LYMPHS PCT: 6 %
MCH: 26.7 pg (ref 26.0–34.0)
MCHC: 33.1 g/dL (ref 32.0–36.0)
MCV: 80.5 fL (ref 80.0–100.0)
MONO ABS: 0.9 10*3/uL (ref 0.2–1.0)
Monocytes Relative: 15 %
Neutro Abs: 4.8 10*3/uL (ref 1.4–6.5)
Neutrophils Relative %: 78 %
PLATELETS: 383 10*3/uL (ref 150–440)
RBC: 3.48 MIL/uL — ABNORMAL LOW (ref 4.40–5.90)
RDW: 19.9 % — AB (ref 11.5–14.5)
WBC: 6.3 10*3/uL (ref 3.8–10.6)

## 2015-11-24 LAB — COMPREHENSIVE METABOLIC PANEL
ALT: 14 U/L — ABNORMAL LOW (ref 17–63)
AST: 28 U/L (ref 15–41)
Albumin: 3.7 g/dL (ref 3.5–5.0)
Alkaline Phosphatase: 60 U/L (ref 38–126)
Anion gap: 11 (ref 5–15)
BUN: 14 mg/dL (ref 6–20)
CHLORIDE: 95 mmol/L — AB (ref 101–111)
CO2: 26 mmol/L (ref 22–32)
CREATININE: 0.58 mg/dL — AB (ref 0.61–1.24)
Calcium: 9 mg/dL (ref 8.9–10.3)
Glucose, Bld: 123 mg/dL — ABNORMAL HIGH (ref 65–99)
POTASSIUM: 4.1 mmol/L (ref 3.5–5.1)
Sodium: 132 mmol/L — ABNORMAL LOW (ref 135–145)
Total Bilirubin: 0.4 mg/dL (ref 0.3–1.2)
Total Protein: 7.1 g/dL (ref 6.5–8.1)

## 2015-11-24 MED ORDER — OXALIPLATIN CHEMO INJECTION 100 MG/20ML
85.0000 mg/m2 | Freq: Once | INTRAVENOUS | Status: AC
  Administered 2015-11-24: 150 mg via INTRAVENOUS
  Filled 2015-11-24: qty 20

## 2015-11-24 MED ORDER — TRASTUZUMAB CHEMO 150 MG IV SOLR
6.0000 mg/kg | Freq: Once | INTRAVENOUS | Status: AC
  Administered 2015-11-24: 378 mg via INTRAVENOUS
  Filled 2015-11-24 (×2): qty 18

## 2015-11-24 MED ORDER — HEPARIN SOD (PORK) LOCK FLUSH 100 UNIT/ML IV SOLN
500.0000 [IU] | Freq: Once | INTRAVENOUS | Status: DC | PRN
  Filled 2015-11-24: qty 5

## 2015-11-24 MED ORDER — DEXAMETHASONE SODIUM PHOSPHATE 10 MG/ML IJ SOLN
10.0000 mg | Freq: Once | INTRAMUSCULAR | Status: AC
  Administered 2015-11-24: 10 mg via INTRAVENOUS
  Filled 2015-11-24: qty 1
  Filled 2015-11-24: qty 3

## 2015-11-24 MED ORDER — SODIUM CHLORIDE 0.9 % IV SOLN: 10.0000 mg | Freq: Once | INTRAVENOUS | Status: DC

## 2015-11-24 MED ORDER — DEXTROSE 5 % IV SOLN
Freq: Once | INTRAVENOUS | Status: AC
  Administered 2015-11-24: 14:00:00 via INTRAVENOUS
  Filled 2015-11-24: qty 1000

## 2015-11-24 MED ORDER — SODIUM CHLORIDE 0.9 % IV SOLN
2400.0000 mg/m2 | INTRAVENOUS | Status: DC
  Administered 2015-11-24: 4200 mg via INTRAVENOUS
  Filled 2015-11-24: qty 84

## 2015-11-24 MED ORDER — TRASTUZUMAB CHEMO 150 MG IV SOLR: 4.0000 mg/kg | Freq: Once | INTRAVENOUS | Status: DC

## 2015-11-24 MED ORDER — DIPHENHYDRAMINE HCL 25 MG PO CAPS
25.0000 mg | ORAL_CAPSULE | Freq: Once | ORAL | Status: AC
  Administered 2015-11-24: 25 mg via ORAL
  Filled 2015-11-24: qty 1

## 2015-11-24 MED ORDER — PALONOSETRON HCL INJECTION 0.25 MG/5ML
0.2500 mg | Freq: Once | INTRAVENOUS | Status: AC
  Administered 2015-11-24: 0.25 mg via INTRAVENOUS
  Filled 2015-11-24: qty 5

## 2015-11-24 MED ORDER — DEXTROSE 5 % IV SOLN
700.0000 mg | Freq: Once | INTRAVENOUS | Status: AC
  Administered 2015-11-24: 700 mg via INTRAVENOUS
  Filled 2015-11-24: qty 35

## 2015-11-24 MED ORDER — SODIUM CHLORIDE 0.9 % IV SOLN
Freq: Once | INTRAVENOUS | Status: AC
  Administered 2015-11-24: 12:00:00 via INTRAVENOUS
  Filled 2015-11-24: qty 1000

## 2015-11-24 MED ORDER — DEXAMETHASONE SODIUM PHOSPHATE 10 MG/ML IJ SOLN: 10.0000 mg | Freq: Once | INTRAMUSCULAR | Status: DC

## 2015-11-24 MED ORDER — FLUOROURACIL CHEMO INJECTION 2.5 GM/50ML
400.0000 mg/m2 | Freq: Once | INTRAVENOUS | Status: AC
  Administered 2015-11-24: 700 mg via INTRAVENOUS
  Filled 2015-11-24: qty 14

## 2015-11-24 MED ORDER — ACETAMINOPHEN 325 MG PO TABS
650.0000 mg | ORAL_TABLET | Freq: Once | ORAL | Status: AC
  Administered 2015-11-24: 650 mg via ORAL
  Filled 2015-11-24: qty 2

## 2015-11-24 MED ORDER — SODIUM CHLORIDE 0.9% FLUSH
10.0000 mL | INTRAVENOUS | Status: DC | PRN
  Administered 2015-11-24: 10 mL
  Filled 2015-11-24: qty 10

## 2015-11-24 NOTE — Progress Notes (Signed)
States is feeling well. Offers no complaints. 

## 2015-11-25 ENCOUNTER — Encounter: Payer: Self-pay | Admitting: Oncology

## 2015-11-25 ENCOUNTER — Ambulatory Visit
Admission: RE | Admit: 2015-11-25 | Discharge: 2015-11-25 | Disposition: A | Discharge status: Home / Self Care | Payer: Medicare Other | Source: Ambulatory Visit | Attending: Radiation Oncology | Admitting: Radiation Oncology

## 2015-11-25 DIAGNOSIS — C787 Secondary malignant neoplasm of liver and intrahepatic bile duct: Secondary | ICD-10-CM | POA: Diagnosis not present

## 2015-11-25 DIAGNOSIS — C155 Malignant neoplasm of lower third of esophagus: Secondary | ICD-10-CM | POA: Diagnosis not present

## 2015-11-25 DIAGNOSIS — C7801 Secondary malignant neoplasm of right lung: Secondary | ICD-10-CM | POA: Diagnosis not present

## 2015-11-25 DIAGNOSIS — C7802 Secondary malignant neoplasm of left lung: Secondary | ICD-10-CM | POA: Diagnosis not present

## 2015-11-25 DIAGNOSIS — Z510 Encounter for antineoplastic radiation therapy: Secondary | ICD-10-CM | POA: Diagnosis not present

## 2015-11-25 DIAGNOSIS — C781 Secondary malignant neoplasm of mediastinum: Secondary | ICD-10-CM | POA: Diagnosis not present

## 2015-11-25 LAB — CANCER ANTIGEN 19-9: CA 19-9: 61874 U/mL — ABNORMAL HIGH (ref 0–35)

## 2015-11-25 LAB — CEA: CEA: 811.7 ng/mL — AB (ref 0.0–4.7)

## 2015-11-26 ENCOUNTER — Ambulatory Visit
Admission: RE | Admit: 2015-11-26 | Discharge: 2015-11-26 | Disposition: A | Discharge status: Home / Self Care | Payer: Medicare Other | Source: Ambulatory Visit | Attending: Radiation Oncology | Admitting: Radiation Oncology

## 2015-11-26 ENCOUNTER — Inpatient Hospital Stay: Payer: Medicare Other

## 2015-11-26 DIAGNOSIS — C155 Malignant neoplasm of lower third of esophagus: Secondary | ICD-10-CM | POA: Diagnosis not present

## 2015-11-26 DIAGNOSIS — Z510 Encounter for antineoplastic radiation therapy: Secondary | ICD-10-CM | POA: Diagnosis not present

## 2015-11-26 DIAGNOSIS — C781 Secondary malignant neoplasm of mediastinum: Secondary | ICD-10-CM | POA: Diagnosis not present

## 2015-11-26 DIAGNOSIS — C801 Malignant (primary) neoplasm, unspecified: Secondary | ICD-10-CM

## 2015-11-26 DIAGNOSIS — D509 Iron deficiency anemia, unspecified: Secondary | ICD-10-CM | POA: Diagnosis not present

## 2015-11-26 DIAGNOSIS — Z5111 Encounter for antineoplastic chemotherapy: Secondary | ICD-10-CM | POA: Diagnosis not present

## 2015-11-26 DIAGNOSIS — C787 Secondary malignant neoplasm of liver and intrahepatic bile duct: Secondary | ICD-10-CM | POA: Diagnosis not present

## 2015-11-26 DIAGNOSIS — C7801 Secondary malignant neoplasm of right lung: Secondary | ICD-10-CM | POA: Diagnosis not present

## 2015-11-26 DIAGNOSIS — Z5112 Encounter for antineoplastic immunotherapy: Secondary | ICD-10-CM | POA: Diagnosis not present

## 2015-11-26 DIAGNOSIS — R1310 Dysphagia, unspecified: Secondary | ICD-10-CM | POA: Diagnosis not present

## 2015-11-26 DIAGNOSIS — C7802 Secondary malignant neoplasm of left lung: Secondary | ICD-10-CM | POA: Diagnosis not present

## 2015-11-26 MED ORDER — HEPARIN SOD (PORK) LOCK FLUSH 100 UNIT/ML IV SOLN
500.0000 [IU] | Freq: Once | INTRAVENOUS | Status: AC
  Administered 2015-11-26: 500 [IU] via INTRAVENOUS

## 2015-11-26 MED ORDER — SODIUM CHLORIDE 0.9 % IJ SOLN
10.0000 mL | Freq: Once | INTRAMUSCULAR | Status: AC
  Administered 2015-11-26: 10 mL via INTRAVENOUS
  Filled 2015-11-26: qty 10

## 2015-11-26 MED ORDER — HEPARIN SOD (PORK) LOCK FLUSH 100 UNIT/ML IV SOLN
INTRAVENOUS | Status: AC
  Filled 2015-11-26: qty 5

## 2015-11-29 ENCOUNTER — Ambulatory Visit
Admission: RE | Admit: 2015-11-29 | Discharge: 2015-11-29 | Disposition: A | Discharge status: Home / Self Care | Payer: Medicare Other | Source: Ambulatory Visit | Attending: Radiation Oncology | Admitting: Radiation Oncology

## 2015-11-29 DIAGNOSIS — C787 Secondary malignant neoplasm of liver and intrahepatic bile duct: Secondary | ICD-10-CM | POA: Diagnosis not present

## 2015-11-29 DIAGNOSIS — C781 Secondary malignant neoplasm of mediastinum: Secondary | ICD-10-CM | POA: Diagnosis not present

## 2015-11-29 DIAGNOSIS — C7802 Secondary malignant neoplasm of left lung: Secondary | ICD-10-CM | POA: Diagnosis not present

## 2015-11-29 DIAGNOSIS — Z510 Encounter for antineoplastic radiation therapy: Secondary | ICD-10-CM | POA: Diagnosis not present

## 2015-11-29 DIAGNOSIS — C7801 Secondary malignant neoplasm of right lung: Secondary | ICD-10-CM | POA: Diagnosis not present

## 2015-11-29 DIAGNOSIS — Z87891 Personal history of nicotine dependence: Secondary | ICD-10-CM | POA: Diagnosis not present

## 2015-11-29 DIAGNOSIS — C155 Malignant neoplasm of lower third of esophagus: Secondary | ICD-10-CM | POA: Diagnosis not present

## 2015-11-30 ENCOUNTER — Ambulatory Visit
Admission: RE | Admit: 2015-11-30 | Discharge: 2015-11-30 | Disposition: A | Discharge status: Home / Self Care | Payer: Medicare Other | Source: Ambulatory Visit | Attending: Radiation Oncology | Admitting: Radiation Oncology

## 2015-11-30 DIAGNOSIS — C781 Secondary malignant neoplasm of mediastinum: Secondary | ICD-10-CM | POA: Diagnosis not present

## 2015-11-30 DIAGNOSIS — C7802 Secondary malignant neoplasm of left lung: Secondary | ICD-10-CM | POA: Diagnosis not present

## 2015-11-30 DIAGNOSIS — C155 Malignant neoplasm of lower third of esophagus: Secondary | ICD-10-CM | POA: Diagnosis not present

## 2015-11-30 DIAGNOSIS — Z510 Encounter for antineoplastic radiation therapy: Secondary | ICD-10-CM | POA: Diagnosis not present

## 2015-11-30 DIAGNOSIS — C787 Secondary malignant neoplasm of liver and intrahepatic bile duct: Secondary | ICD-10-CM | POA: Diagnosis not present

## 2015-11-30 DIAGNOSIS — C7801 Secondary malignant neoplasm of right lung: Secondary | ICD-10-CM | POA: Diagnosis not present

## 2015-12-01 ENCOUNTER — Other Ambulatory Visit: Payer: Self-pay | Admitting: *Deleted

## 2015-12-01 ENCOUNTER — Ambulatory Visit
Admission: RE | Admit: 2015-12-01 | Discharge: 2015-12-01 | Disposition: A | Discharge status: Home / Self Care | Payer: Medicare Other | Source: Ambulatory Visit | Attending: Radiation Oncology | Admitting: Radiation Oncology

## 2015-12-01 DIAGNOSIS — C787 Secondary malignant neoplasm of liver and intrahepatic bile duct: Secondary | ICD-10-CM | POA: Diagnosis not present

## 2015-12-01 DIAGNOSIS — C155 Malignant neoplasm of lower third of esophagus: Secondary | ICD-10-CM

## 2015-12-01 DIAGNOSIS — Z510 Encounter for antineoplastic radiation therapy: Secondary | ICD-10-CM | POA: Diagnosis not present

## 2015-12-01 DIAGNOSIS — C7802 Secondary malignant neoplasm of left lung: Secondary | ICD-10-CM | POA: Diagnosis not present

## 2015-12-01 DIAGNOSIS — C781 Secondary malignant neoplasm of mediastinum: Secondary | ICD-10-CM | POA: Diagnosis not present

## 2015-12-01 DIAGNOSIS — C7801 Secondary malignant neoplasm of right lung: Secondary | ICD-10-CM | POA: Diagnosis not present

## 2015-12-01 MED ORDER — MEGESTROL ACETATE 40 MG PO TABS: 40.0000 mg | ORAL_TABLET | Freq: Every day | ORAL | 2 refills | Status: DC

## 2015-12-02 ENCOUNTER — Ambulatory Visit
Admission: RE | Admit: 2015-12-02 | Discharge: 2015-12-02 | Disposition: A | Discharge status: Home / Self Care | Payer: Medicare Other | Source: Ambulatory Visit | Attending: Radiation Oncology | Admitting: Radiation Oncology

## 2015-12-02 DIAGNOSIS — Z510 Encounter for antineoplastic radiation therapy: Secondary | ICD-10-CM | POA: Diagnosis not present

## 2015-12-02 DIAGNOSIS — C155 Malignant neoplasm of lower third of esophagus: Secondary | ICD-10-CM | POA: Diagnosis not present

## 2015-12-02 DIAGNOSIS — C7801 Secondary malignant neoplasm of right lung: Secondary | ICD-10-CM | POA: Diagnosis not present

## 2015-12-02 DIAGNOSIS — C7802 Secondary malignant neoplasm of left lung: Secondary | ICD-10-CM | POA: Diagnosis not present

## 2015-12-02 DIAGNOSIS — C781 Secondary malignant neoplasm of mediastinum: Secondary | ICD-10-CM | POA: Diagnosis not present

## 2015-12-02 DIAGNOSIS — C787 Secondary malignant neoplasm of liver and intrahepatic bile duct: Secondary | ICD-10-CM | POA: Diagnosis not present

## 2015-12-03 ENCOUNTER — Ambulatory Visit
Admission: RE | Admit: 2015-12-03 | Discharge: 2015-12-03 | Disposition: A | Discharge status: Home / Self Care | Payer: Medicare Other | Source: Ambulatory Visit | Attending: Radiation Oncology | Admitting: Radiation Oncology

## 2015-12-03 DIAGNOSIS — Z510 Encounter for antineoplastic radiation therapy: Secondary | ICD-10-CM | POA: Diagnosis not present

## 2015-12-03 DIAGNOSIS — C7802 Secondary malignant neoplasm of left lung: Secondary | ICD-10-CM | POA: Diagnosis not present

## 2015-12-03 DIAGNOSIS — C787 Secondary malignant neoplasm of liver and intrahepatic bile duct: Secondary | ICD-10-CM | POA: Diagnosis not present

## 2015-12-03 DIAGNOSIS — C7801 Secondary malignant neoplasm of right lung: Secondary | ICD-10-CM | POA: Diagnosis not present

## 2015-12-03 DIAGNOSIS — C155 Malignant neoplasm of lower third of esophagus: Secondary | ICD-10-CM | POA: Diagnosis not present

## 2015-12-03 DIAGNOSIS — C781 Secondary malignant neoplasm of mediastinum: Secondary | ICD-10-CM | POA: Diagnosis not present

## 2015-12-06 ENCOUNTER — Ambulatory Visit
Admission: RE | Admit: 2015-12-06 | Discharge: 2015-12-06 | Disposition: A | Discharge status: Home / Self Care | Payer: Medicare Other | Source: Ambulatory Visit | Attending: Radiation Oncology | Admitting: Radiation Oncology

## 2015-12-06 DIAGNOSIS — C7802 Secondary malignant neoplasm of left lung: Secondary | ICD-10-CM | POA: Diagnosis not present

## 2015-12-06 DIAGNOSIS — C155 Malignant neoplasm of lower third of esophagus: Secondary | ICD-10-CM | POA: Diagnosis not present

## 2015-12-06 DIAGNOSIS — C7801 Secondary malignant neoplasm of right lung: Secondary | ICD-10-CM | POA: Diagnosis not present

## 2015-12-06 DIAGNOSIS — C787 Secondary malignant neoplasm of liver and intrahepatic bile duct: Secondary | ICD-10-CM | POA: Diagnosis not present

## 2015-12-06 DIAGNOSIS — C781 Secondary malignant neoplasm of mediastinum: Secondary | ICD-10-CM | POA: Diagnosis not present

## 2015-12-06 DIAGNOSIS — Z510 Encounter for antineoplastic radiation therapy: Secondary | ICD-10-CM | POA: Diagnosis not present

## 2015-12-06 DIAGNOSIS — Z87891 Personal history of nicotine dependence: Secondary | ICD-10-CM | POA: Diagnosis not present

## 2015-12-07 ENCOUNTER — Ambulatory Visit
Admission: RE | Admit: 2015-12-07 | Discharge: 2015-12-07 | Disposition: A | Discharge status: Home / Self Care | Payer: Medicare Other | Source: Ambulatory Visit | Attending: Radiation Oncology | Admitting: Radiation Oncology

## 2015-12-07 DIAGNOSIS — C7802 Secondary malignant neoplasm of left lung: Secondary | ICD-10-CM | POA: Diagnosis not present

## 2015-12-07 DIAGNOSIS — C787 Secondary malignant neoplasm of liver and intrahepatic bile duct: Secondary | ICD-10-CM | POA: Diagnosis not present

## 2015-12-07 DIAGNOSIS — C781 Secondary malignant neoplasm of mediastinum: Secondary | ICD-10-CM | POA: Diagnosis not present

## 2015-12-07 DIAGNOSIS — Z510 Encounter for antineoplastic radiation therapy: Secondary | ICD-10-CM | POA: Diagnosis not present

## 2015-12-07 DIAGNOSIS — C155 Malignant neoplasm of lower third of esophagus: Secondary | ICD-10-CM | POA: Diagnosis not present

## 2015-12-07 DIAGNOSIS — C7801 Secondary malignant neoplasm of right lung: Secondary | ICD-10-CM | POA: Diagnosis not present

## 2015-12-07 NOTE — Progress Notes (Signed)
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  INTERVAL HISTORY:  Patient returns to clinic today for further evaluation and consideration of cycle 4 of FOLFOX plus Herceptin.  He reports an improved appetite and swallowing, but still has continued weight loss. His weakness and fatigue is still evident, but improved as well. He has no neurologic complaints. He denies any fevers. He denies any chest pain or shortness of breath. He denies any nausea, vomiting, constipation, or diarrhea. He denies any melena or hematochezia. Patient offers no further specific complaints.  REVIEW OF SYSTEMS:   Review of Systems  Constitutional: Positive for malaise/fatigue and weight loss. Negative for fever.  Respiratory: Negative.  Negative for cough and shortness of breath.   Cardiovascular: Negative.  Negative for chest pain.  Gastrointestinal: Negative for abdominal pain, blood in stool, melena and vomiting.  Musculoskeletal: Negative.   Skin: Negative.   Neurological: Positive for weakness.  Psychiatric/Behavioral: Negative.  The patient is not nervous/anxious.     As per HPI. Otherwise, a complete review of systems is negative.  PAST MEDICAL HISTORY: Past Medical History:  Diagnosis Date  . Anemia   . Arthritis   . 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 Diabetes mellitus without complication ([place])   . Ectatic abdominal aorta ([place]) 08/12/2015  . Esophageal cancer ([place])   . GI bleed   . Hearing loss    left ear    . Hypertension   . Hypothyroidism   . Liver mass   . Mild left atrial enlargement 08/12/2015   Very mild; LA 4.2 cm; echo March 2017  . Mitral valvular regurgitation 08/12/2015  . Sleep apnea   . Stroke ([place])   . TIA (transient ischemic attack)   . 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 APPENDECTOMY    . EUS N/A 10/07/2015   Procedure: FULL UPPER ENDOSCOPIC ULTRASOUND (EUS) RADIAL;  Surgeon: Holly Bodily, MD;  Location: [facility] ENDOSCOPY;  Service: Endoscopy;  Laterality: N/A;  . EYE SURGERY    . FRACTURE SURGERY    . NOSE SURGERY    . PERIPHERAL VASCULAR CATHETERIZATION N/A 10/26/2015   Procedure: Glori Luis Cath Insertion;  Surgeon: Katha Cabal, MD;  Location: [facility];  Service: Cardiovascular;  Laterality: N/A;    FAMILY HISTORY Family History  Problem Relation Age of Onset  . Hypertension Mother   . Lung cancer Father   . Hypertension Brother        ADVANCED DIRECTIVES:    HEALTH MAINTENANCE: Social History  Substance Use Topics  . Smoking status: Former Smoker    Packs/day: 1.00    Types: Cigarettes    Quit date: 10/03/1983  . Smokeless tobacco: Never Used  . Alcohol use 0.0 oz/week     Comment: rarely     Colonoscopy:  PAP:  Bone density:  Lipid panel:  Allergies  Allergen Reactions  . Sulfa Antibiotics Itching and Hives  . Amoxicillin Hives  . Ampicillin Hives  . Keflex [  Cephalexin] Itching  . Lisinopril Other (See Comments)  . Losartan Other (See Comments)  . Sulfamethoxazole-Trimethoprim Other (See Comments)  . Cephalosporins Hives    Current Outpatient Prescriptions  Medication Sig Dispense Refill  . aspirin EC 81 MG tablet Take 162 mg by mouth 2 (two) times daily.    . Biotin (BIOTIN 5000) 5 MG CAPS Take by mouth.    . Cholecalciferol (VITAMIN D3) 1000 UNITS CAPS Take 2,000 Units by mouth daily.    . Coenzyme Q10 (CO Q-10)  200 MG CAPS Take by mouth.    . isosorbide mononitrate (IMDUR) 60 MG 24 hr tablet Take 1 tablet (60 mg total) by mouth daily. 30 tablet 0  . levothyroxine (SYNTHROID, LEVOTHROID) 100 MCG tablet Take 100 mcg by mouth every morning.    . lidocaine-prilocaine (EMLA) cream Apply 1 application topically as needed. Apply to port 1-2 hours prior to chemotherapy. Cover with plastic wrap. 30 g 2  . Lutein 40 MG CAPS Take by mouth.    . Magnesium Oxide 250 MG TABS Take 1 tablet (250 mg total) by mouth daily.  0  . megestrol (MEGACE) 40 MG tablet Take 1 tablet (40 mg total) by mouth daily. 30 tablet 2  . metFORMIN (GLUCOPHAGE) 1000 MG tablet Take 2,000 mg by mouth daily.    . metoprolol tartrate (LOPRESSOR) 25 MG tablet Take 12.5 mg by mouth 2 (two) times daily.     . Misc Natural Products (BEE PROPOLIS PO) Take 1 tablet by mouth 2 (two) times daily.     . Multiple Vitamins-Minerals (MULTI FOR HIM 50+ PO) Take 1 tablet by mouth daily.    . Omega-3 Fatty Acids (FISH OIL) 1000 MG CAPS Take 1 capsule by mouth daily.    . ondansetron (ZOFRAN) 8 MG tablet Take 1 tablet (8 mg total) by mouth every 8 (eight) hours as needed for nausea or vomiting. 30 tablet 2  . prochlorperazine (COMPAZINE) 10 MG tablet Take 1 tablet (10 mg total) by mouth every 6 (six) hours as needed for nausea or vomiting. 30 tablet 2  . Red Yeast Rice 600 MG TABS Take 2 tablets by mouth at bedtime.    . sucralfate (CARAFATE) 1 g tablet Take 1 tablet (1 g total) by mouth 3 (three) times daily. Dissolve each tablet in 2-3 tbsp warm water, swish and swallow. 90 tablet 2   No current facility-administered medications for this visit.     OBJECTIVE: Vitals:   12/08/15 0954  BP: 135/63  Pulse: 76  Resp: 18  Temp: 98.2 F (36.8 C)     Body mass index is 20.29 kg/m.    ECOG FS:1 - Symptomatic but completely ambulatory  General: Well-developed, well-nourished, no acute distress. Eyes: Pink conjunctiva, anicteric sclera. Lungs: Clear to  auscultation bilaterally. Heart: Regular rate and rhythm. No rubs, murmurs, or gallops. Abdomen: Soft, nontender, nondistended. No organomegaly noted, normoactive bowel sounds. Musculoskeletal: No edema, cyanosis, or clubbing. Neuro: Alert, answering all questions appropriately. Cranial nerves grossly intact. Skin: No rashes or petechiae noted. Psych: Normal affect.   LAB RESULTS:  Lab Results  Component Value Date   NA 133 (L) 12/08/2015   K 3.7 12/08/2015   CL 100 (L) 12/08/2015   CO2 23 12/08/2015   GLUCOSE 181 (H) 12/08/2015   BUN 15 12/08/2015   CREATININE 0.57 (L) 12/08/2015   CALCIUM 8.6 (L) 12/08/2015   PROT 6.1 (L) 12/08/2015   ALBUMIN 3.3 (L) 12/08/2015   AST 26 12/08/2015   ALT 12 (  L) 12/08/2015   ALKPHOS 47 12/08/2015   BILITOT 0.2 (L) 12/08/2015   GFRNONAA >60 12/08/2015   GFRAA >60 12/08/2015    Lab Results  Component Value Date   WBC 4.5 12/08/2015   NEUTROABS 3.7 12/08/2015   HGB 8.5 (L) 12/08/2015   HCT 26.2 (L) 12/08/2015   MCV 81.4 12/08/2015   PLT 258 12/08/2015   Lab Results  Component Value Date   IRON 30 (L) 09/29/2015   TIBC 355 09/29/2015   IRONPCTSAT 9 (L) 09/29/2015    Lab Results  Component Value Date   FERRITIN 72 09/29/2015     STUDIES: Nm Cardiac Muga Rest  Result Date: 11/22/2015 CLINICAL DATA:  Malignant neoplasm of the esophagus. EXAM: NUCLEAR MEDICINE CARDIAC BLOOD POOL IMAGING (MUGA) TECHNIQUE: Cardiac multi-gated acquisition was performed at rest following intravenous injection of Tc-20mlabeled red blood cells. RADIOPHARMACEUTICALS:  20.05 mCi Tc-920mDP in-vitro labeled red blood cells IV COMPARISON:  None. FINDINGS: The left ventricular ejection fraction is calculated equal 52%. No significant wall motion abnormality identified. IMPRESSION: 1. Left ventricular ejection fraction equals 52%. Electronically Signed   By: TaKerby Moors.D.   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  PLAN:    1. Stage IV adenocarcinoma of the lower third esophagus, HER-2 positive, with liver metastasis: PET scan results reviewed independently with metastatic disease in liver. Biopsy confirmed adenocarcinoma consistent with esophageal origin. MUGA scan on November 22, 2015 reported an EF of 52%. Proceed with cycle 4 of FOLFOX plus Herceptin today.  Continue daily XRT for palliation of his difficulty swallowing completing on December 17, 2015.  Return to clinic in 2 days for pump removal and then in 2 weeks for consideration of cycle 5.  Will reimage after 6 cycles.  2. Iron deficiency anemia: Patient's hemoglobin has improved with 1 unit of packed red blood cells. Monitor.  Repeat iron stores and next clinic visit. 3. Recent MI: Treatment per cardiology. MUGA as above. 4. Weight loss: Likely secondary to underlying malignancy, monitor. 5. Dysphagia: XRT as above.  Patient expressed understanding and was in agreement with this plan. He also understands that He can call clinic at any time with any questions, concerns, or complaints.    TiLloyd HugerMD   12/12/2015 11:24 AM

## 2015-12-08 ENCOUNTER — Ambulatory Visit
Admission: RE | Admit: 2015-12-08 | Discharge: 2015-12-08 | Disposition: A | Discharge status: Home / Self Care | Payer: Medicare Other | Source: Ambulatory Visit | Attending: Radiation Oncology | Admitting: Radiation Oncology

## 2015-12-08 ENCOUNTER — Other Ambulatory Visit: Payer: Medicare Other

## 2015-12-08 ENCOUNTER — Inpatient Hospital Stay: Payer: Medicare Other

## 2015-12-08 ENCOUNTER — Inpatient Hospital Stay (HOSPITAL_BASED_OUTPATIENT_CLINIC_OR_DEPARTMENT_OTHER): Payer: Medicare Other | Admitting: Oncology

## 2015-12-08 ENCOUNTER — Ambulatory Visit: Payer: Medicare Other | Admitting: Oncology

## 2015-12-08 VITALS — BP 135/63 | HR 76 | Temp 98.2°F | Resp 18 | Wt 129.5 lb

## 2015-12-08 DIAGNOSIS — C787 Secondary malignant neoplasm of liver and intrahepatic bile duct: Secondary | ICD-10-CM | POA: Diagnosis not present

## 2015-12-08 DIAGNOSIS — Z87891 Personal history of nicotine dependence: Secondary | ICD-10-CM

## 2015-12-08 DIAGNOSIS — Z5111 Encounter for antineoplastic chemotherapy: Secondary | ICD-10-CM | POA: Diagnosis not present

## 2015-12-08 DIAGNOSIS — D509 Iron deficiency anemia, unspecified: Secondary | ICD-10-CM | POA: Diagnosis not present

## 2015-12-08 DIAGNOSIS — Z7984 Long term (current) use of oral hypoglycemic drugs: Secondary | ICD-10-CM

## 2015-12-08 DIAGNOSIS — Z7982 Long term (current) use of aspirin: Secondary | ICD-10-CM

## 2015-12-08 DIAGNOSIS — Z801 Family history of malignant neoplasm of trachea, bronchus and lung: Secondary | ICD-10-CM

## 2015-12-08 DIAGNOSIS — R531 Weakness: Secondary | ICD-10-CM

## 2015-12-08 DIAGNOSIS — R1310 Dysphagia, unspecified: Secondary | ICD-10-CM

## 2015-12-08 DIAGNOSIS — C155 Malignant neoplasm of lower third of esophagus: Secondary | ICD-10-CM | POA: Diagnosis not present

## 2015-12-08 DIAGNOSIS — D649 Anemia, unspecified: Secondary | ICD-10-CM

## 2015-12-08 DIAGNOSIS — Z5112 Encounter for antineoplastic immunotherapy: Secondary | ICD-10-CM | POA: Diagnosis not present

## 2015-12-08 DIAGNOSIS — I340 Nonrheumatic mitral (valve) insufficiency: Secondary | ICD-10-CM

## 2015-12-08 DIAGNOSIS — R634 Abnormal weight loss: Secondary | ICD-10-CM

## 2015-12-08 DIAGNOSIS — Z510 Encounter for antineoplastic radiation therapy: Secondary | ICD-10-CM | POA: Diagnosis not present

## 2015-12-08 DIAGNOSIS — C7802 Secondary malignant neoplasm of left lung: Secondary | ICD-10-CM | POA: Diagnosis not present

## 2015-12-08 DIAGNOSIS — I10 Essential (primary) hypertension: Secondary | ICD-10-CM

## 2015-12-08 DIAGNOSIS — M129 Arthropathy, unspecified: Secondary | ICD-10-CM

## 2015-12-08 DIAGNOSIS — M5134 Other intervertebral disc degeneration, thoracic region: Secondary | ICD-10-CM

## 2015-12-08 DIAGNOSIS — C781 Secondary malignant neoplasm of mediastinum: Secondary | ICD-10-CM | POA: Diagnosis not present

## 2015-12-08 DIAGNOSIS — C7801 Secondary malignant neoplasm of right lung: Secondary | ICD-10-CM | POA: Diagnosis not present

## 2015-12-08 DIAGNOSIS — Z8673 Personal history of transient ischemic attack (TIA), and cerebral infarction without residual deficits: Secondary | ICD-10-CM

## 2015-12-08 DIAGNOSIS — E119 Type 2 diabetes mellitus without complications: Secondary | ICD-10-CM

## 2015-12-08 DIAGNOSIS — R5383 Other fatigue: Secondary | ICD-10-CM

## 2015-12-08 DIAGNOSIS — E039 Hypothyroidism, unspecified: Secondary | ICD-10-CM

## 2015-12-08 DIAGNOSIS — G4730 Sleep apnea, unspecified: Secondary | ICD-10-CM

## 2015-12-08 LAB — CBC WITH DIFFERENTIAL/PLATELET
BASOS ABS: 0 10*3/uL (ref 0–0.1)
Basophils Relative: 0 %
EOS ABS: 0 10*3/uL (ref 0–0.7)
EOS PCT: 1 %
HCT: 26.2 % — ABNORMAL LOW (ref 40.0–52.0)
HEMOGLOBIN: 8.5 g/dL — AB (ref 13.0–18.0)
LYMPHS ABS: 0.2 10*3/uL — AB (ref 1.0–3.6)
Lymphocytes Relative: 5 %
MCH: 26.6 pg (ref 26.0–34.0)
MCHC: 32.6 g/dL (ref 32.0–36.0)
MCV: 81.4 fL (ref 80.0–100.0)
Monocytes Absolute: 0.6 10*3/uL (ref 0.2–1.0)
Monocytes Relative: 13 %
NEUTROS PCT: 81 %
Neutro Abs: 3.7 10*3/uL (ref 1.4–6.5)
PLATELETS: 258 10*3/uL (ref 150–440)
RBC: 3.21 MIL/uL — AB (ref 4.40–5.90)
RDW: 20.9 % — ABNORMAL HIGH (ref 11.5–14.5)
WBC: 4.5 10*3/uL (ref 3.8–10.6)

## 2015-12-08 LAB — COMPREHENSIVE METABOLIC PANEL
ALBUMIN: 3.3 g/dL — AB (ref 3.5–5.0)
ALK PHOS: 47 U/L (ref 38–126)
ALT: 12 U/L — AB (ref 17–63)
AST: 26 U/L (ref 15–41)
Anion gap: 10 (ref 5–15)
BUN: 15 mg/dL (ref 6–20)
CALCIUM: 8.6 mg/dL — AB (ref 8.9–10.3)
CHLORIDE: 100 mmol/L — AB (ref 101–111)
CO2: 23 mmol/L (ref 22–32)
CREATININE: 0.57 mg/dL — AB (ref 0.61–1.24)
GFR calc Af Amer: >60 mL/min (ref >60)
GFR calc non Af Amer: >60 mL/min (ref >60)
GLUCOSE: 181 mg/dL — AB (ref 65–99)
Potassium: 3.7 mmol/L (ref 3.5–5.1)
SODIUM: 133 mmol/L — AB (ref 135–145)
Total Bilirubin: 0.2 mg/dL — ABNORMAL LOW (ref 0.3–1.2)
Total Protein: 6.1 g/dL — ABNORMAL LOW (ref 6.5–8.1)

## 2015-12-08 MED ORDER — DEXAMETHASONE SODIUM PHOSPHATE 10 MG/ML IJ SOLN
10.0000 mg | Freq: Once | INTRAMUSCULAR | Status: AC
  Administered 2015-12-08: 10 mg via INTRAVENOUS
  Filled 2015-12-08: qty 1

## 2015-12-08 MED ORDER — SODIUM CHLORIDE 0.9 % IV SOLN
Freq: Once | INTRAVENOUS | Status: AC
  Administered 2015-12-08: 12:00:00 via INTRAVENOUS
  Filled 2015-12-08: qty 1000

## 2015-12-08 MED ORDER — ACETAMINOPHEN 325 MG PO TABS
650.0000 mg | ORAL_TABLET | Freq: Once | ORAL | Status: AC
  Administered 2015-12-08: 650 mg via ORAL
  Filled 2015-12-08: qty 2

## 2015-12-08 MED ORDER — PALONOSETRON HCL INJECTION 0.25 MG/5ML
0.2500 mg | Freq: Once | INTRAVENOUS | Status: AC
  Administered 2015-12-08: 0.25 mg via INTRAVENOUS
  Filled 2015-12-08: qty 5

## 2015-12-08 MED ORDER — OXALIPLATIN CHEMO INJECTION 100 MG/20ML
85.0000 mg/m2 | Freq: Once | INTRAVENOUS | Status: AC
  Administered 2015-12-08: 150 mg via INTRAVENOUS
  Filled 2015-12-08: qty 20

## 2015-12-08 MED ORDER — DIPHENHYDRAMINE HCL 25 MG PO CAPS
25.0000 mg | ORAL_CAPSULE | Freq: Once | ORAL | Status: AC
  Administered 2015-12-08: 25 mg via ORAL
  Filled 2015-12-08: qty 1

## 2015-12-08 MED ORDER — SODIUM CHLORIDE 0.9 % IV SOLN
4.0000 mg/kg | Freq: Once | INTRAVENOUS | Status: AC
  Administered 2015-12-08: 252 mg via INTRAVENOUS
  Filled 2015-12-08: qty 12

## 2015-12-08 MED ORDER — SODIUM CHLORIDE 0.9 % IV SOLN: 10.0000 mg | Freq: Once | INTRAVENOUS | Status: DC

## 2015-12-08 MED ORDER — LEUCOVORIN CALCIUM INJECTION 350 MG
700.0000 mg | Freq: Once | INTRAVENOUS | Status: AC
  Administered 2015-12-08: 700 mg via INTRAVENOUS
  Filled 2015-12-08: qty 35

## 2015-12-08 MED ORDER — SODIUM CHLORIDE 0.9 % IV SOLN
2400.0000 mg/m2 | INTRAVENOUS | Status: DC
  Administered 2015-12-08: 4200 mg via INTRAVENOUS
  Filled 2015-12-08: qty 84

## 2015-12-08 MED ORDER — DEXTROSE 5 % IV SOLN
Freq: Once | INTRAVENOUS | Status: AC
  Administered 2015-12-08: 13:00:00 via INTRAVENOUS
  Filled 2015-12-08: qty 1000

## 2015-12-08 MED ORDER — FLUOROURACIL CHEMO INJECTION 2.5 GM/50ML
400.0000 mg/m2 | Freq: Once | INTRAVENOUS | Status: AC
  Administered 2015-12-08: 700 mg via INTRAVENOUS
  Filled 2015-12-08: qty 14

## 2015-12-08 NOTE — Progress Notes (Signed)
States is feeling well. Per pt's spouse, he is not eating or drinking very well and has decreased appetite.

## 2015-12-09 ENCOUNTER — Ambulatory Visit
Admission: RE | Admit: 2015-12-09 | Discharge: 2015-12-09 | Disposition: A | Discharge status: Home / Self Care | Payer: Medicare Other | Source: Ambulatory Visit | Attending: Radiation Oncology | Admitting: Radiation Oncology

## 2015-12-09 DIAGNOSIS — C7801 Secondary malignant neoplasm of right lung: Secondary | ICD-10-CM | POA: Diagnosis not present

## 2015-12-09 DIAGNOSIS — Z510 Encounter for antineoplastic radiation therapy: Secondary | ICD-10-CM | POA: Diagnosis not present

## 2015-12-09 DIAGNOSIS — C787 Secondary malignant neoplasm of liver and intrahepatic bile duct: Secondary | ICD-10-CM | POA: Diagnosis not present

## 2015-12-09 DIAGNOSIS — C781 Secondary malignant neoplasm of mediastinum: Secondary | ICD-10-CM | POA: Diagnosis not present

## 2015-12-09 DIAGNOSIS — C7802 Secondary malignant neoplasm of left lung: Secondary | ICD-10-CM | POA: Diagnosis not present

## 2015-12-09 DIAGNOSIS — C155 Malignant neoplasm of lower third of esophagus: Secondary | ICD-10-CM | POA: Diagnosis not present

## 2015-12-09 LAB — CANCER ANTIGEN 19-9: CA 19 9: 25275 U/mL — AB (ref 0–35)

## 2015-12-09 LAB — CEA: CEA: 355.8 ng/mL — ABNORMAL HIGH (ref 0.0–4.7)

## 2015-12-10 ENCOUNTER — Ambulatory Visit
Admission: RE | Admit: 2015-12-10 | Discharge: 2015-12-10 | Disposition: A | Discharge status: Home / Self Care | Payer: Medicare Other | Source: Ambulatory Visit | Attending: Radiation Oncology | Admitting: Radiation Oncology

## 2015-12-10 ENCOUNTER — Inpatient Hospital Stay: Payer: Medicare Other

## 2015-12-10 DIAGNOSIS — Z510 Encounter for antineoplastic radiation therapy: Secondary | ICD-10-CM | POA: Diagnosis not present

## 2015-12-10 DIAGNOSIS — C801 Malignant (primary) neoplasm, unspecified: Secondary | ICD-10-CM

## 2015-12-10 DIAGNOSIS — C155 Malignant neoplasm of lower third of esophagus: Secondary | ICD-10-CM | POA: Diagnosis not present

## 2015-12-10 DIAGNOSIS — Z5111 Encounter for antineoplastic chemotherapy: Secondary | ICD-10-CM | POA: Diagnosis not present

## 2015-12-10 DIAGNOSIS — C787 Secondary malignant neoplasm of liver and intrahepatic bile duct: Secondary | ICD-10-CM | POA: Diagnosis not present

## 2015-12-10 DIAGNOSIS — Z5112 Encounter for antineoplastic immunotherapy: Secondary | ICD-10-CM | POA: Diagnosis not present

## 2015-12-10 DIAGNOSIS — C7801 Secondary malignant neoplasm of right lung: Secondary | ICD-10-CM | POA: Diagnosis not present

## 2015-12-10 DIAGNOSIS — D509 Iron deficiency anemia, unspecified: Secondary | ICD-10-CM | POA: Diagnosis not present

## 2015-12-10 DIAGNOSIS — R1310 Dysphagia, unspecified: Secondary | ICD-10-CM | POA: Diagnosis not present

## 2015-12-10 DIAGNOSIS — C781 Secondary malignant neoplasm of mediastinum: Secondary | ICD-10-CM | POA: Diagnosis not present

## 2015-12-10 DIAGNOSIS — C7802 Secondary malignant neoplasm of left lung: Secondary | ICD-10-CM | POA: Diagnosis not present

## 2015-12-10 MED ORDER — SODIUM CHLORIDE 0.9 % IJ SOLN
10.0000 mL | Freq: Once | INTRAMUSCULAR | Status: AC
  Administered 2015-12-10: 10 mL via INTRAVENOUS
  Filled 2015-12-10: qty 10

## 2015-12-10 MED ORDER — HEPARIN SOD (PORK) LOCK FLUSH 100 UNIT/ML IV SOLN
500.0000 [IU] | Freq: Once | INTRAVENOUS | Status: AC
  Administered 2015-12-10: 500 [IU] via INTRAVENOUS
  Filled 2015-12-10: qty 5

## 2015-12-13 ENCOUNTER — Ambulatory Visit
Admission: RE | Admit: 2015-12-13 | Discharge: 2015-12-13 | Disposition: A | Discharge status: Home / Self Care | Payer: Medicare Other | Source: Ambulatory Visit | Attending: Radiation Oncology | Admitting: Radiation Oncology

## 2015-12-13 DIAGNOSIS — C787 Secondary malignant neoplasm of liver and intrahepatic bile duct: Secondary | ICD-10-CM | POA: Diagnosis not present

## 2015-12-13 DIAGNOSIS — C7801 Secondary malignant neoplasm of right lung: Secondary | ICD-10-CM | POA: Diagnosis not present

## 2015-12-13 DIAGNOSIS — C155 Malignant neoplasm of lower third of esophagus: Secondary | ICD-10-CM | POA: Diagnosis not present

## 2015-12-13 DIAGNOSIS — C781 Secondary malignant neoplasm of mediastinum: Secondary | ICD-10-CM | POA: Diagnosis not present

## 2015-12-13 DIAGNOSIS — Z87891 Personal history of nicotine dependence: Secondary | ICD-10-CM | POA: Diagnosis not present

## 2015-12-13 DIAGNOSIS — Z510 Encounter for antineoplastic radiation therapy: Secondary | ICD-10-CM | POA: Diagnosis not present

## 2015-12-13 DIAGNOSIS — C7802 Secondary malignant neoplasm of left lung: Secondary | ICD-10-CM | POA: Diagnosis not present

## 2015-12-14 ENCOUNTER — Ambulatory Visit
Admission: RE | Admit: 2015-12-14 | Discharge: 2015-12-14 | Disposition: A | Discharge status: Home / Self Care | Payer: Medicare Other | Source: Ambulatory Visit | Attending: Radiation Oncology | Admitting: Radiation Oncology

## 2015-12-14 DIAGNOSIS — C7801 Secondary malignant neoplasm of right lung: Secondary | ICD-10-CM | POA: Diagnosis not present

## 2015-12-14 DIAGNOSIS — C155 Malignant neoplasm of lower third of esophagus: Secondary | ICD-10-CM | POA: Diagnosis not present

## 2015-12-14 DIAGNOSIS — C7802 Secondary malignant neoplasm of left lung: Secondary | ICD-10-CM | POA: Diagnosis not present

## 2015-12-14 DIAGNOSIS — C781 Secondary malignant neoplasm of mediastinum: Secondary | ICD-10-CM | POA: Diagnosis not present

## 2015-12-14 DIAGNOSIS — Z510 Encounter for antineoplastic radiation therapy: Secondary | ICD-10-CM | POA: Diagnosis not present

## 2015-12-14 DIAGNOSIS — C787 Secondary malignant neoplasm of liver and intrahepatic bile duct: Secondary | ICD-10-CM | POA: Diagnosis not present

## 2015-12-15 ENCOUNTER — Ambulatory Visit
Admission: RE | Admit: 2015-12-15 | Discharge: 2015-12-15 | Disposition: A | Discharge status: Home / Self Care | Payer: Medicare Other | Source: Ambulatory Visit | Attending: Radiation Oncology | Admitting: Radiation Oncology

## 2015-12-15 DIAGNOSIS — C787 Secondary malignant neoplasm of liver and intrahepatic bile duct: Secondary | ICD-10-CM | POA: Diagnosis not present

## 2015-12-15 DIAGNOSIS — C781 Secondary malignant neoplasm of mediastinum: Secondary | ICD-10-CM | POA: Diagnosis not present

## 2015-12-15 DIAGNOSIS — C155 Malignant neoplasm of lower third of esophagus: Secondary | ICD-10-CM | POA: Diagnosis not present

## 2015-12-15 DIAGNOSIS — C7802 Secondary malignant neoplasm of left lung: Secondary | ICD-10-CM | POA: Diagnosis not present

## 2015-12-15 DIAGNOSIS — Z510 Encounter for antineoplastic radiation therapy: Secondary | ICD-10-CM | POA: Diagnosis not present

## 2015-12-15 DIAGNOSIS — C7801 Secondary malignant neoplasm of right lung: Secondary | ICD-10-CM | POA: Diagnosis not present

## 2015-12-16 ENCOUNTER — Ambulatory Visit: Payer: Medicare Other

## 2015-12-16 DIAGNOSIS — C7801 Secondary malignant neoplasm of right lung: Secondary | ICD-10-CM | POA: Diagnosis not present

## 2015-12-16 DIAGNOSIS — C155 Malignant neoplasm of lower third of esophagus: Secondary | ICD-10-CM | POA: Diagnosis not present

## 2015-12-16 DIAGNOSIS — Z510 Encounter for antineoplastic radiation therapy: Secondary | ICD-10-CM | POA: Diagnosis not present

## 2015-12-16 DIAGNOSIS — C787 Secondary malignant neoplasm of liver and intrahepatic bile duct: Secondary | ICD-10-CM | POA: Diagnosis not present

## 2015-12-16 DIAGNOSIS — C7802 Secondary malignant neoplasm of left lung: Secondary | ICD-10-CM | POA: Diagnosis not present

## 2015-12-16 DIAGNOSIS — C781 Secondary malignant neoplasm of mediastinum: Secondary | ICD-10-CM | POA: Diagnosis not present

## 2015-12-17 ENCOUNTER — Ambulatory Visit: Payer: Medicare Other

## 2015-12-22 ENCOUNTER — Inpatient Hospital Stay: Payer: Medicare Other

## 2015-12-22 ENCOUNTER — Inpatient Hospital Stay: Payer: Medicare Other | Attending: Oncology

## 2015-12-22 ENCOUNTER — Inpatient Hospital Stay (HOSPITAL_BASED_OUTPATIENT_CLINIC_OR_DEPARTMENT_OTHER): Payer: Medicare Other | Admitting: Oncology

## 2015-12-22 VITALS — BP 126/74 | HR 71 | Temp 97.2°F | Resp 18 | Wt 126.9 lb

## 2015-12-22 DIAGNOSIS — I77811 Abdominal aortic ectasia: Secondary | ICD-10-CM | POA: Diagnosis not present

## 2015-12-22 DIAGNOSIS — Z8673 Personal history of transient ischemic attack (TIA), and cerebral infarction without residual deficits: Secondary | ICD-10-CM | POA: Insufficient documentation

## 2015-12-22 DIAGNOSIS — E119 Type 2 diabetes mellitus without complications: Secondary | ICD-10-CM | POA: Diagnosis not present

## 2015-12-22 DIAGNOSIS — C787 Secondary malignant neoplasm of liver and intrahepatic bile duct: Secondary | ICD-10-CM

## 2015-12-22 DIAGNOSIS — Z7689 Persons encountering health services in other specified circumstances: Secondary | ICD-10-CM

## 2015-12-22 DIAGNOSIS — I700 Atherosclerosis of aorta: Secondary | ICD-10-CM | POA: Diagnosis not present

## 2015-12-22 DIAGNOSIS — R1310 Dysphagia, unspecified: Secondary | ICD-10-CM | POA: Insufficient documentation

## 2015-12-22 DIAGNOSIS — Z79899 Other long term (current) drug therapy: Secondary | ICD-10-CM | POA: Insufficient documentation

## 2015-12-22 DIAGNOSIS — R634 Abnormal weight loss: Secondary | ICD-10-CM

## 2015-12-22 DIAGNOSIS — G4730 Sleep apnea, unspecified: Secondary | ICD-10-CM

## 2015-12-22 DIAGNOSIS — M129 Arthropathy, unspecified: Secondary | ICD-10-CM | POA: Insufficient documentation

## 2015-12-22 DIAGNOSIS — M5134 Other intervertebral disc degeneration, thoracic region: Secondary | ICD-10-CM

## 2015-12-22 DIAGNOSIS — I517 Cardiomegaly: Secondary | ICD-10-CM | POA: Insufficient documentation

## 2015-12-22 DIAGNOSIS — Z801 Family history of malignant neoplasm of trachea, bronchus and lung: Secondary | ICD-10-CM | POA: Insufficient documentation

## 2015-12-22 DIAGNOSIS — D509 Iron deficiency anemia, unspecified: Secondary | ICD-10-CM | POA: Insufficient documentation

## 2015-12-22 DIAGNOSIS — R531 Weakness: Secondary | ICD-10-CM | POA: Insufficient documentation

## 2015-12-22 DIAGNOSIS — E039 Hypothyroidism, unspecified: Secondary | ICD-10-CM | POA: Diagnosis not present

## 2015-12-22 DIAGNOSIS — R5383 Other fatigue: Secondary | ICD-10-CM | POA: Insufficient documentation

## 2015-12-22 DIAGNOSIS — I10 Essential (primary) hypertension: Secondary | ICD-10-CM

## 2015-12-22 DIAGNOSIS — C155 Malignant neoplasm of lower third of esophagus: Secondary | ICD-10-CM | POA: Insufficient documentation

## 2015-12-22 DIAGNOSIS — D709 Neutropenia, unspecified: Secondary | ICD-10-CM | POA: Diagnosis not present

## 2015-12-22 DIAGNOSIS — Z8719 Personal history of other diseases of the digestive system: Secondary | ICD-10-CM

## 2015-12-22 DIAGNOSIS — Z87891 Personal history of nicotine dependence: Secondary | ICD-10-CM | POA: Diagnosis not present

## 2015-12-22 DIAGNOSIS — Z7984 Long term (current) use of oral hypoglycemic drugs: Secondary | ICD-10-CM | POA: Diagnosis not present

## 2015-12-22 DIAGNOSIS — Q2546 Tortuous aortic arch: Secondary | ICD-10-CM

## 2015-12-22 DIAGNOSIS — Z5111 Encounter for antineoplastic chemotherapy: Secondary | ICD-10-CM | POA: Insufficient documentation

## 2015-12-22 DIAGNOSIS — Z7982 Long term (current) use of aspirin: Secondary | ICD-10-CM

## 2015-12-22 DIAGNOSIS — Z5112 Encounter for antineoplastic immunotherapy: Secondary | ICD-10-CM | POA: Insufficient documentation

## 2015-12-22 LAB — CBC WITH DIFFERENTIAL/PLATELET
BASOS PCT: 2 %
Basophils Absolute: 0 10*3/uL (ref 0–0.1)
Eosinophils Absolute: 0 10*3/uL (ref 0–0.7)
Eosinophils Relative: 2 %
HEMATOCRIT: 25.7 % — AB (ref 40.0–52.0)
Hemoglobin: 8.6 g/dL — ABNORMAL LOW (ref 13.0–18.0)
LYMPHS ABS: 0.1 10*3/uL — AB (ref 1.0–3.6)
Lymphocytes Relative: 7 %
MCH: 27.3 pg (ref 26.0–34.0)
MCHC: 33.3 g/dL (ref 32.0–36.0)
MCV: 82.2 fL (ref 80.0–100.0)
MONO ABS: 0.7 10*3/uL (ref 0.2–1.0)
MONOS PCT: 34 %
NEUTROS ABS: 1.1 10*3/uL — AB (ref 1.4–6.5)
Neutrophils Relative %: 55 %
Platelets: 325 10*3/uL (ref 150–440)
RBC: 3.13 MIL/uL — ABNORMAL LOW (ref 4.40–5.90)
RDW: 24.5 % — AB (ref 11.5–14.5)
WBC: 1.9 10*3/uL — ABNORMAL LOW (ref 3.8–10.6)

## 2015-12-22 LAB — COMPREHENSIVE METABOLIC PANEL
ALT: 14 U/L — ABNORMAL LOW (ref 17–63)
ANION GAP: 7 (ref 5–15)
AST: 25 U/L (ref 15–41)
Albumin: 3.8 g/dL (ref 3.5–5.0)
Alkaline Phosphatase: 55 U/L (ref 38–126)
BUN: 12 mg/dL (ref 6–20)
CHLORIDE: 99 mmol/L — AB (ref 101–111)
CO2: 24 mmol/L (ref 22–32)
Calcium: 8.4 mg/dL — ABNORMAL LOW (ref 8.9–10.3)
Creatinine, Ser: 0.57 mg/dL — ABNORMAL LOW (ref 0.61–1.24)
Glucose, Bld: 168 mg/dL — ABNORMAL HIGH (ref 65–99)
POTASSIUM: 4.1 mmol/L (ref 3.5–5.1)
Sodium: 130 mmol/L — ABNORMAL LOW (ref 135–145)
TOTAL PROTEIN: 6.5 g/dL (ref 6.5–8.1)
Total Bilirubin: 0.5 mg/dL (ref 0.3–1.2)

## 2015-12-22 LAB — IRON AND TIBC
IRON: 31 ug/dL — AB (ref 45–182)
SATURATION RATIOS: 8 % — AB (ref 17.9–39.5)
TIBC: 400 ug/dL (ref 250–450)
UIBC: 369 ug/dL

## 2015-12-22 LAB — FERRITIN: FERRITIN: 32 ng/mL (ref 24–336)

## 2015-12-22 MED ORDER — SODIUM CHLORIDE 0.9 % IV SOLN
Freq: Once | INTRAVENOUS | Status: AC
  Administered 2015-12-22: 10:00:00 via INTRAVENOUS
  Filled 2015-12-22: qty 1000

## 2015-12-22 MED ORDER — SODIUM CHLORIDE 0.9% FLUSH
10.0000 mL | Freq: Once | INTRAVENOUS | Status: AC
  Administered 2015-12-22: 10 mL via INTRAVENOUS
  Filled 2015-12-22: qty 10

## 2015-12-22 MED ORDER — DEXTROSE 5 % IV SOLN
85.0000 mg/m2 | Freq: Once | INTRAVENOUS | Status: AC
  Administered 2015-12-22: 150 mg via INTRAVENOUS
  Filled 2015-12-22: qty 20

## 2015-12-22 MED ORDER — DEXTROSE 5 % IV SOLN
Freq: Once | INTRAVENOUS | Status: AC
  Administered 2015-12-22: 12:00:00 via INTRAVENOUS
  Filled 2015-12-22: qty 1000

## 2015-12-22 MED ORDER — FLUOROURACIL CHEMO INJECTION 5 GM/100ML
2400.0000 mg/m2 | INTRAVENOUS | Status: DC
  Administered 2015-12-22: 4200 mg via INTRAVENOUS
  Filled 2015-12-22: qty 84

## 2015-12-22 MED ORDER — FLUOROURACIL CHEMO INJECTION 2.5 GM/50ML
400.0000 mg/m2 | Freq: Once | INTRAVENOUS | Status: AC
  Administered 2015-12-22: 700 mg via INTRAVENOUS
  Filled 2015-12-22: qty 14

## 2015-12-22 MED ORDER — SODIUM CHLORIDE 0.9 % IV SOLN: 10.0000 mg | Freq: Once | INTRAVENOUS | Status: DC

## 2015-12-22 MED ORDER — ACETAMINOPHEN 325 MG PO TABS
650.0000 mg | ORAL_TABLET | Freq: Once | ORAL | Status: AC
  Administered 2015-12-22: 650 mg via ORAL
  Filled 2015-12-22: qty 2

## 2015-12-22 MED ORDER — PALONOSETRON HCL INJECTION 0.25 MG/5ML
0.2500 mg | Freq: Once | INTRAVENOUS | Status: AC
  Administered 2015-12-22: 0.25 mg via INTRAVENOUS
  Filled 2015-12-22: qty 5

## 2015-12-22 MED ORDER — LEUCOVORIN CALCIUM INJECTION 350 MG
700.0000 mg | Freq: Once | INTRAVENOUS | Status: AC
  Administered 2015-12-22: 700 mg via INTRAVENOUS
  Filled 2015-12-22: qty 35

## 2015-12-22 MED ORDER — DEXAMETHASONE SODIUM PHOSPHATE 10 MG/ML IJ SOLN
10.0000 mg | Freq: Once | INTRAMUSCULAR | Status: AC
  Administered 2015-12-22: 10 mg via INTRAVENOUS
  Filled 2015-12-22: qty 1

## 2015-12-22 MED ORDER — DIPHENHYDRAMINE HCL 25 MG PO CAPS
25.0000 mg | ORAL_CAPSULE | Freq: Once | ORAL | Status: AC
  Administered 2015-12-22: 25 mg via ORAL
  Filled 2015-12-22: qty 1

## 2015-12-22 MED ORDER — TRASTUZUMAB CHEMO 150 MG IV SOLR
4.0000 mg/kg | Freq: Once | INTRAVENOUS | Status: AC
  Administered 2015-12-22: 252 mg via INTRAVENOUS
  Filled 2015-12-22: qty 12

## 2015-12-22 NOTE — Progress Notes (Signed)
States is not eating and drinking well. Continues to have weight loss.

## 2015-12-22 NOTE — Progress Notes (Signed)
ANC: 1100. MD, Dr. Grayland Ormond, notified via telephone and aware. Per MD order: proceed with chemotherapy treatment today.

## 2015-12-22 NOTE — Progress Notes (Signed)
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  INTERVAL HISTORY:  Patient returns to clinic today for further evaluation and consideration of cycle 5 of FOLFOX plus Herceptin.  He reports an improved appetite and swallowing, but still has continued weight loss. His weakness and fatigue is still evident, but improved as well. He has no neurologic complaints. He denies any fevers. He denies any chest pain or shortness of breath. He denies any nausea, vomiting, constipation, or diarrhea. He denies any melena or hematochezia. Patient offers no further specific complaints.  REVIEW OF SYSTEMS:   Review of Systems  Constitutional: Positive for malaise/fatigue and weight loss. Negative for fever.  Respiratory: Negative.  Negative for cough and shortness of breath.   Cardiovascular: Negative.  Negative for chest pain.  Gastrointestinal: Negative for abdominal pain, blood in stool, melena and vomiting.  Musculoskeletal: Negative.   Skin: Negative.   Neurological: Positive for weakness.  Psychiatric/Behavioral: Negative.  The patient is not nervous/anxious.     As per HPI. Otherwise, a complete review of systems is negative.  PAST MEDICAL HISTORY: Past Medical History:  Diagnosis Date  . Anemia   . Arthritis   . 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 Diabetes mellitus without complication ([place])   . Ectatic abdominal aorta ([place]) 08/12/2015  . Esophageal cancer ([place])   . GI bleed   . Hearing loss    left ear    . Hypertension   . Hypothyroidism   . Liver mass   . Mild left atrial enlargement 08/12/2015   Very mild; LA 4.2 cm; echo March 2017  . Mitral valvular regurgitation 08/12/2015  . Sleep apnea   . Stroke ([place])   . TIA (transient ischemic attack)   . 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 APPENDECTOMY    . EUS N/A 10/07/2015   Procedure: FULL UPPER ENDOSCOPIC ULTRASOUND (EUS) RADIAL;  Surgeon: Holly Bodily, MD;  Location: [facility] ENDOSCOPY;  Service: Endoscopy;  Laterality: N/A;  . EYE SURGERY    . FRACTURE SURGERY    . NOSE SURGERY    . PERIPHERAL VASCULAR CATHETERIZATION N/A 10/26/2015   Procedure: Glori Luis Cath Insertion;  Surgeon: Katha Cabal, MD;  Location: [facility];  Service: Cardiovascular;  Laterality: N/A;    FAMILY HISTORY Family History  Problem Relation Age of Onset  . Hypertension Mother   . Lung cancer Father   . Hypertension Brother        ADVANCED DIRECTIVES:    HEALTH MAINTENANCE: Social History  Substance Use Topics  . Smoking status: Former Smoker    Packs/day: 1.00    Types: Cigarettes    Quit date: 10/03/1983  . Smokeless tobacco: Never Used  . Alcohol use 0.0 oz/week     Comment: rarely     Colonoscopy:  PAP:  Bone density:  Lipid panel:  Allergies  Allergen Reactions  . Sulfa Antibiotics Itching and Hives  . Amoxicillin Hives  . Ampicillin Hives  . Keflex [  Cephalexin] Itching  . Lisinopril Other (See Comments)  . Losartan Other (See Comments)  . Sulfamethoxazole-Trimethoprim Other (See Comments)  . Cephalosporins Hives    Current Outpatient Prescriptions  Medication Sig Dispense Refill  . aspirin EC 81 MG tablet Take 162 mg by mouth 2 (two) times daily.    . Biotin (BIOTIN 5000) 5 MG CAPS Take by mouth.    . Cholecalciferol (VITAMIN D3) 1000 UNITS CAPS Take 2,000 Units by mouth daily.    . Coenzyme Q10 (CO Q-10)  200 MG CAPS Take by mouth.    . isosorbide mononitrate (IMDUR) 60 MG 24 hr tablet Take 1 tablet (60 mg total) by mouth daily. 30 tablet 0  . levothyroxine (SYNTHROID, LEVOTHROID) 100 MCG tablet Take 100 mcg by mouth every morning.    . lidocaine-prilocaine (EMLA) cream Apply 1 application topically as needed. Apply to port 1-2 hours prior to chemotherapy. Cover with plastic wrap. 30 g 2  . Lutein 40 MG CAPS Take by mouth.    . Magnesium Oxide 250 MG TABS Take 1 tablet (250 mg total) by mouth daily.  0  . megestrol (MEGACE) 40 MG tablet Take 1 tablet (40 mg total) by mouth daily. 30 tablet 2  . metFORMIN (GLUCOPHAGE) 1000 MG tablet Take 2,000 mg by mouth daily.    . metoprolol tartrate (LOPRESSOR) 25 MG tablet Take 12.5 mg by mouth 2 (two) times daily.     . Misc Natural Products (BEE PROPOLIS PO) Take 1 tablet by mouth 2 (two) times daily.     . Multiple Vitamins-Minerals (MULTI FOR HIM 50+ PO) Take 1 tablet by mouth daily.    . Omega-3 Fatty Acids (FISH OIL) 1000 MG CAPS Take 1 capsule by mouth daily.    . ondansetron (ZOFRAN) 8 MG tablet Take 1 tablet (8 mg total) by mouth every 8 (eight) hours as needed for nausea or vomiting. 30 tablet 2  . prochlorperazine (COMPAZINE) 10 MG tablet Take 1 tablet (10 mg total) by mouth every 6 (six) hours as needed for nausea or vomiting. 30 tablet 2  . Red Yeast Rice 600 MG TABS Take 2 tablets by mouth at bedtime.    . sucralfate (CARAFATE) 1 g tablet Take 1 tablet (1 g total) by mouth 3 (three) times daily. Dissolve each tablet in 2-3 tbsp warm water, swish and swallow. 90 tablet 2   No current facility-administered medications for this visit.    Facility-Administered Medications Ordered in Other Visits  Medication Dose Route Frequency Provider Last Rate Last Dose  . acetaminophen (TYLENOL) tablet 650 mg  650 mg Oral Once Lloyd Huger, MD      . dexamethasone (DECADRON) injection 10 mg  10 mg Intravenous Once Lloyd Huger, MD      . dextrose 5  % solution   Intravenous Once Lloyd Huger, MD      . diphenhydrAMINE (BENADRYL) capsule 25 mg  25 mg Oral Once Lloyd Huger, MD      . fluorouracil (ADRUCIL) 4,200 mg in sodium chloride 0.9 % 66 mL chemo infusion  2,400 mg/m2 (Treatment Plan Recorded) Intravenous 1 day or 1 dose Lloyd Huger, MD      . fluorouracil (ADRUCIL) chemo injection 700 mg  400 mg/m2 (Treatment Plan Recorded) Intravenous Once Lloyd Huger, MD      . leucovorin 700 mg in dextrose 5 % 250 mL infusion  700 mg Intravenous Once Lloyd Huger, MD      . oxaliplatin (  ELOXATIN) 150 mg in dextrose 5 % 500 mL chemo infusion  85 mg/m2 (Treatment Plan Recorded) Intravenous Once Lloyd Huger, MD      . palonosetron (ALOXI) injection 0.25 mg  0.25 mg Intravenous Once Lloyd Huger, MD      . trastuzumab (HERCEPTIN) 252 mg in sodium chloride 0.9 % 250 mL chemo infusion  4 mg/kg (Treatment Plan Recorded) Intravenous Once Lloyd Huger, MD        OBJECTIVE: Vitals:   12/22/15 0911  BP: 126/74  Pulse: 71  Resp: 18  Temp: 97.2 F (36.2 C)     Body mass index is 19.87 kg/m.    ECOG FS:1 - Symptomatic but completely ambulatory  General: Well-developed, well-nourished, no acute distress. Eyes: Pink conjunctiva, anicteric sclera. Lungs: Clear to auscultation bilaterally. Heart: Regular rate and rhythm. No rubs, murmurs, or gallops. Abdomen: Soft, nontender, nondistended. No organomegaly noted, normoactive bowel sounds. Musculoskeletal: No edema, cyanosis, or clubbing. Neuro: Alert, answering all questions appropriately. Cranial nerves grossly intact. Skin: No rashes or petechiae noted. Psych: Normal affect.   LAB RESULTS:  Lab Results  Component Value Date   NA 130 (L) 12/22/2015   K 4.1 12/22/2015   CL 99 (L) 12/22/2015   CO2 24 12/22/2015   GLUCOSE 168 (H) 12/22/2015   BUN 12 12/22/2015   CREATININE 0.57 (L) 12/22/2015   CALCIUM 8.4 (L) 12/22/2015   PROT 6.5 12/22/2015    ALBUMIN 3.8 12/22/2015   AST 25 12/22/2015   ALT 14 (L) 12/22/2015   ALKPHOS 55 12/22/2015   BILITOT 0.5 12/22/2015   GFRNONAA >60 12/22/2015   GFRAA >60 12/22/2015    Lab Results  Component Value Date   WBC 1.9 (L) 12/22/2015   NEUTROABS 1.1 (L) 12/22/2015   HGB 8.6 (L) 12/22/2015   HCT 25.7 (L) 12/22/2015   MCV 82.2 12/22/2015   PLT 325 12/22/2015   Lab Results  Component Value Date   IRON 30 (L) 09/29/2015   TIBC 355 09/29/2015   IRONPCTSAT 9 (L) 09/29/2015    Lab Results  Component Value Date   FERRITIN 72 09/29/2015   Lab Results  Component Value Date   CA199 25,275 (H) 12/08/2015   Lab Results  Component Value Date   CEA 355.8 (H) 12/08/2015     STUDIES: No results found.  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  PLAN:    1. Stage IV adenocarcinoma of the lower third esophagus, HER-2 positive, with liver metastasis: PET scan results reviewed independently with metastatic disease in liver. Biopsy confirmed adenocarcinoma consistent with esophageal origin. MUGA scan on November 22, 2015 reported an EF of 52%. CA-19-9 and CEA are trending down. Proceed with cycle 5 of FOLFOX plus Herceptin today.  Patient has now completed his XRT.  Return to clinic in 2 days for pump removal and Neulasta. Patient will then return to clinic in 3 weeks because of the Thanksgiving holiday for consideration of cycle 6. Will reimage after 6 cycles.  2. Iron deficiency anemia: Patient's hemoglobin decreased but stable, monitor. 3. Recent MI: Treatment per cardiology. MUGA as above. 4. Weight loss: Likely secondary to underlying malignancy, monitor. 5. Dysphagia: XRT as above. 6. Neutropenia: Will add a Neulasta with the remainder of his cycles.  Patient expressed understanding and was in agreement with this plan. He also understands that He can call clinic at any time with any questions, concerns, or complaints.     Christia [place]  Bridget Hartshorn, MD   12/22/2015 10:18 AM

## 2015-12-23 ENCOUNTER — Encounter: Payer: Self-pay | Admitting: Family Medicine

## 2015-12-23 LAB — CEA: CEA: 153.9 ng/mL — ABNORMAL HIGH (ref 0.0–4.7)

## 2015-12-23 LAB — CANCER ANTIGEN 19-9: CA 19-9: 9940 U/mL — ABNORMAL HIGH (ref 0–35)

## 2015-12-24 ENCOUNTER — Inpatient Hospital Stay: Payer: Medicare Other

## 2015-12-24 DIAGNOSIS — C155 Malignant neoplasm of lower third of esophagus: Secondary | ICD-10-CM | POA: Diagnosis not present

## 2015-12-24 DIAGNOSIS — D509 Iron deficiency anemia, unspecified: Secondary | ICD-10-CM | POA: Diagnosis not present

## 2015-12-24 DIAGNOSIS — Z5112 Encounter for antineoplastic immunotherapy: Secondary | ICD-10-CM | POA: Diagnosis not present

## 2015-12-24 DIAGNOSIS — C787 Secondary malignant neoplasm of liver and intrahepatic bile duct: Secondary | ICD-10-CM | POA: Diagnosis not present

## 2015-12-24 DIAGNOSIS — Z5111 Encounter for antineoplastic chemotherapy: Secondary | ICD-10-CM | POA: Diagnosis not present

## 2015-12-24 DIAGNOSIS — Z7689 Persons encountering health services in other specified circumstances: Secondary | ICD-10-CM | POA: Diagnosis not present

## 2015-12-24 MED ORDER — PEGFILGRASTIM INJECTION 6 MG/0.6ML ~~LOC~~
6.0000 mg | PREFILLED_SYRINGE | Freq: Once | SUBCUTANEOUS | Status: AC
  Administered 2015-12-24: 6 mg via SUBCUTANEOUS
  Filled 2015-12-24: qty 0.6

## 2015-12-24 MED ORDER — HEPARIN SOD (PORK) LOCK FLUSH 100 UNIT/ML IV SOLN
500.0000 [IU] | Freq: Once | INTRAVENOUS | Status: AC | PRN
  Administered 2015-12-24: 500 [IU]

## 2015-12-24 MED ORDER — HEPARIN SOD (PORK) LOCK FLUSH 100 UNIT/ML IV SOLN
INTRAVENOUS | Status: AC
  Filled 2015-12-24: qty 5

## 2015-12-27 ENCOUNTER — Telehealth: Payer: Self-pay | Admitting: *Deleted

## 2015-12-27 NOTE — Telephone Encounter (Signed)
Call returned to patients wife. Patient has been using Coricidin HBP at home, advised patients wife that it fine for patient to continue Coricidin HBP. Patients wife would like to hold off on any prescription strength cough medicine at this time. She reports patient is running temp of 99.4, I told her to call back if symptoms worsen or if temp reaches 100.4.

## 2015-12-27 NOTE — Telephone Encounter (Signed)
-----   Message from Lloyd Huger, MD sent at 12/27/2015 10:54 AM EST ----- Contact: [phone] Can call in Tussonex if needed.   ----- Message ----- From: Cephus Richer Sent: 12/27/2015   9:23 AM To: Lloyd Huger, MD, Johney Maine, RN, #  Pt is coughing pretty bad . Would like to know if someone could call pt wife [name] and let her know what he can take for the cough.

## 2015-12-29 ENCOUNTER — Telehealth: Payer: Self-pay | Admitting: *Deleted

## 2015-12-29 ENCOUNTER — Telehealth: Payer: Self-pay | Admitting: Family Medicine

## 2015-12-29 ENCOUNTER — Encounter: Payer: Self-pay | Admitting: Oncology

## 2015-12-29 MED ORDER — HYDROCOD POLST-CPM POLST ER 10-8 MG/5ML PO SUER: 5.0000 mL | Freq: Two times a day (BID) | ORAL | 0 refills | Status: DC | PRN

## 2015-12-29 NOTE — Telephone Encounter (Signed)
Patient's wife entered information related to HER, not her husband.

## 2015-12-29 NOTE — Telephone Encounter (Signed)
Pt would like to someone to call her back . PT is still coughing. She looking for prescription that she thinks she is suppose to pick up.    Tussionex rx ordered and wife will pick up rx

## 2015-12-30 ENCOUNTER — Inpatient Hospital Stay: Payer: Medicare Other

## 2015-12-30 ENCOUNTER — Ambulatory Visit
Admission: RE | Admit: 2015-12-30 | Discharge: 2015-12-30 | Disposition: A | Discharge status: Home / Self Care | Payer: Medicare Other | Source: Ambulatory Visit | Attending: Oncology | Admitting: Oncology

## 2015-12-30 ENCOUNTER — Telehealth: Payer: Self-pay | Admitting: *Deleted

## 2015-12-30 ENCOUNTER — Other Ambulatory Visit: Payer: Self-pay | Admitting: *Deleted

## 2015-12-30 ENCOUNTER — Other Ambulatory Visit: Payer: Self-pay | Admitting: Oncology

## 2015-12-30 DIAGNOSIS — Z5111 Encounter for antineoplastic chemotherapy: Secondary | ICD-10-CM | POA: Diagnosis not present

## 2015-12-30 DIAGNOSIS — I700 Atherosclerosis of aorta: Secondary | ICD-10-CM | POA: Insufficient documentation

## 2015-12-30 DIAGNOSIS — C159 Malignant neoplasm of esophagus, unspecified: Secondary | ICD-10-CM

## 2015-12-30 DIAGNOSIS — R05 Cough: Secondary | ICD-10-CM | POA: Insufficient documentation

## 2015-12-30 DIAGNOSIS — C155 Malignant neoplasm of lower third of esophagus: Secondary | ICD-10-CM | POA: Diagnosis not present

## 2015-12-30 DIAGNOSIS — R5081 Fever presenting with conditions classified elsewhere: Secondary | ICD-10-CM

## 2015-12-30 DIAGNOSIS — R059 Cough, unspecified: Secondary | ICD-10-CM

## 2015-12-30 DIAGNOSIS — D509 Iron deficiency anemia, unspecified: Secondary | ICD-10-CM | POA: Diagnosis not present

## 2015-12-30 DIAGNOSIS — C153 Malignant neoplasm of upper third of esophagus: Secondary | ICD-10-CM

## 2015-12-30 DIAGNOSIS — Z5112 Encounter for antineoplastic immunotherapy: Secondary | ICD-10-CM | POA: Diagnosis not present

## 2015-12-30 DIAGNOSIS — Z7689 Persons encountering health services in other specified circumstances: Secondary | ICD-10-CM | POA: Diagnosis not present

## 2015-12-30 DIAGNOSIS — C787 Secondary malignant neoplasm of liver and intrahepatic bile duct: Secondary | ICD-10-CM | POA: Diagnosis not present

## 2015-12-30 LAB — CBC WITH DIFFERENTIAL/PLATELET
Basophils Absolute: 0 10*3/uL (ref 0–0.1)
Basophils Relative: 0 %
EOS ABS: 0 10*3/uL (ref 0–0.7)
Eosinophils Relative: 4 %
HCT: 22.2 % — ABNORMAL LOW (ref 40.0–52.0)
Hemoglobin: 7.3 g/dL — ABNORMAL LOW (ref 13.0–18.0)
LYMPHS ABS: 0 10*3/uL — AB (ref 1.0–3.6)
LYMPHS PCT: 34 %
MCH: 26.7 pg (ref 26.0–34.0)
MCHC: 32.9 g/dL (ref 32.0–36.0)
MCV: 81.3 fL (ref 80.0–100.0)
Monocytes Absolute: 0.1 10*3/uL — ABNORMAL LOW (ref 0.2–1.0)
Monocytes Relative: 54 %
NEUTROS PCT: 8 %
Neutro Abs: 0 10*3/uL — ABNORMAL LOW (ref 1.4–6.5)
Platelets: 161 10*3/uL (ref 150–440)
RBC: 2.72 MIL/uL — AB (ref 4.40–5.90)
RDW: 25.4 % — ABNORMAL HIGH (ref 11.5–14.5)
WBC: 0.1 10*3/uL — AB (ref 3.8–10.6)

## 2015-12-30 LAB — COMPREHENSIVE METABOLIC PANEL
ALK PHOS: 49 U/L (ref 38–126)
ALT: 11 U/L — AB (ref 17–63)
AST: 23 U/L (ref 15–41)
Albumin: 3.2 g/dL — ABNORMAL LOW (ref 3.5–5.0)
Anion gap: 9 (ref 5–15)
BUN: 11 mg/dL (ref 6–20)
CALCIUM: 8 mg/dL — AB (ref 8.9–10.3)
CO2: 22 mmol/L (ref 22–32)
CREATININE: 0.53 mg/dL — AB (ref 0.61–1.24)
Chloride: 94 mmol/L — ABNORMAL LOW (ref 101–111)
GFR calc non Af Amer: >60 mL/min (ref >60)
GLUCOSE: 237 mg/dL — AB (ref 65–99)
Potassium: 3.3 mmol/L — ABNORMAL LOW (ref 3.5–5.1)
SODIUM: 125 mmol/L — AB (ref 135–145)
Total Bilirubin: 0.6 mg/dL (ref 0.3–1.2)
Total Protein: 6.4 g/dL — ABNORMAL LOW (ref 6.5–8.1)

## 2015-12-30 MED ORDER — DOXYCYCLINE HYCLATE 100 MG PO TABS: 100.0000 mg | ORAL_TABLET | Freq: Two times a day (BID) | ORAL | 0 refills | Status: DC

## 2015-12-30 NOTE — Telephone Encounter (Signed)
Not eating or drinking, Temp 100.5, "listing to the right". Feet ankles and hands swollen, nose bleeding, dripping blood and also when he blows it.. Continues to cough could not afford Tussionex. Asking that something be done. Please advise

## 2015-12-30 NOTE — Telephone Encounter (Signed)
Per Dr Grayland Ormond, come in for CBC, CMP, CXR call in Doxycycline 100 mg BID times 7 days. Informed wife of this and she asked for paper Rx for med and will bring him for lab and cxr

## 2015-12-30 NOTE — Telephone Encounter (Signed)
Spoke with pt's wife regarding blood transfusion for 11/17 at 9am. Informed pt's wife that hgb is 7.3 and the pt will require 1 unit of blood. Questioned if pt could come back to lab today in order for blood bank to process type and screen/crossmatch and pt's wife said that the pt is too weak to come back to clinic today and elected to have labs drawn prior to blood transfusion tomorrow. Pt's wife was given neutropenic precautions as well since pt has ANC 0.0 at this time. Pt's wife verbalized understanding and stated will be at appt tomorrow at [facility].

## 2015-12-31 ENCOUNTER — Inpatient Hospital Stay: Payer: Medicare Other

## 2015-12-31 DIAGNOSIS — C159 Malignant neoplasm of esophagus, unspecified: Secondary | ICD-10-CM

## 2015-12-31 DIAGNOSIS — Z7689 Persons encountering health services in other specified circumstances: Secondary | ICD-10-CM | POA: Diagnosis not present

## 2015-12-31 DIAGNOSIS — D509 Iron deficiency anemia, unspecified: Secondary | ICD-10-CM | POA: Diagnosis not present

## 2015-12-31 DIAGNOSIS — Z5111 Encounter for antineoplastic chemotherapy: Secondary | ICD-10-CM | POA: Diagnosis not present

## 2015-12-31 DIAGNOSIS — C155 Malignant neoplasm of lower third of esophagus: Secondary | ICD-10-CM | POA: Diagnosis not present

## 2015-12-31 DIAGNOSIS — C787 Secondary malignant neoplasm of liver and intrahepatic bile duct: Secondary | ICD-10-CM | POA: Diagnosis not present

## 2015-12-31 DIAGNOSIS — Z5112 Encounter for antineoplastic immunotherapy: Secondary | ICD-10-CM | POA: Diagnosis not present

## 2015-12-31 LAB — PREPARE RBC (CROSSMATCH)

## 2015-12-31 MED ORDER — SODIUM CHLORIDE 0.9 % IV SOLN
INTRAVENOUS | Status: AC
  Filled 2015-12-31: qty 1000

## 2015-12-31 MED ORDER — HEPARIN SOD (PORK) LOCK FLUSH 100 UNIT/ML IV SOLN
500.0000 [IU] | Freq: Once | INTRAVENOUS | Status: AC
  Administered 2015-12-31: 500 [IU] via INTRAVENOUS
  Filled 2015-12-31: qty 5

## 2015-12-31 MED ORDER — DIPHENHYDRAMINE HCL 25 MG PO CAPS
25.0000 mg | ORAL_CAPSULE | Freq: Once | ORAL | Status: AC
  Administered 2015-12-31: 25 mg via ORAL
  Filled 2015-12-31: qty 1

## 2015-12-31 MED ORDER — ACETAMINOPHEN 325 MG PO TABS
650.0000 mg | ORAL_TABLET | Freq: Once | ORAL | Status: AC
  Administered 2015-12-31: 650 mg via ORAL
  Filled 2015-12-31: qty 2

## 2015-12-31 MED ORDER — SODIUM CHLORIDE 0.9 % IJ SOLN
10.0000 mL | Freq: Once | INTRAMUSCULAR | Status: AC
  Administered 2015-12-31: 10 mL via INTRAVENOUS
  Filled 2015-12-31: qty 10

## 2016-01-01 ENCOUNTER — Encounter: Payer: Self-pay | Admitting: Oncology

## 2016-01-01 ENCOUNTER — Encounter: Payer: Self-pay | Admitting: Family Medicine

## 2016-01-01 LAB — TYPE AND SCREEN
ABO/RH(D): A POS
ANTIBODY SCREEN: NEGATIVE
UNIT DIVISION: 0

## 2016-01-03 NOTE — Telephone Encounter (Signed)
Referral for Lifepath has been faxed in to assist pt. This has been communicated to the patient and his wife.

## 2016-01-05 ENCOUNTER — Telehealth: Payer: Self-pay | Admitting: Family Medicine

## 2016-01-05 NOTE — Telephone Encounter (Signed)
Wife sent message through patient's chart; really should be in her chart, with exception of his tetanus booster data  Cecille Rubin shot was     :  05/02/2011 ----------------------- Staff, Please update patient's tetanus booster, 05/02/2011 Thank you

## 2016-01-10 ENCOUNTER — Telehealth: Payer: Self-pay | Admitting: *Deleted

## 2016-01-10 NOTE — Telephone Encounter (Signed)
Returned Triage call regarding "yellow phlegm". Dr. Burlene Arnt had already addressed issue. Zpac ordered.  Patient's wife expressed concern over patient's ability to keep appt. This week secondary to generalized weakness.

## 2016-01-11 DIAGNOSIS — D709 Neutropenia, unspecified: Secondary | ICD-10-CM | POA: Diagnosis not present

## 2016-01-11 DIAGNOSIS — E119 Type 2 diabetes mellitus without complications: Secondary | ICD-10-CM | POA: Diagnosis not present

## 2016-01-11 DIAGNOSIS — R634 Abnormal weight loss: Secondary | ICD-10-CM | POA: Diagnosis not present

## 2016-01-11 DIAGNOSIS — C787 Secondary malignant neoplasm of liver and intrahepatic bile duct: Secondary | ICD-10-CM | POA: Diagnosis not present

## 2016-01-11 DIAGNOSIS — D509 Iron deficiency anemia, unspecified: Secondary | ICD-10-CM | POA: Diagnosis not present

## 2016-01-11 DIAGNOSIS — C155 Malignant neoplasm of lower third of esophagus: Secondary | ICD-10-CM | POA: Diagnosis not present

## 2016-01-11 DIAGNOSIS — Z9181 History of falling: Secondary | ICD-10-CM | POA: Diagnosis not present

## 2016-01-11 NOTE — Progress Notes (Signed)
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  INTERVAL HISTORY:  Patient returns to clinic today for further evaluation and consideration of cycle 6 of FOLFOX plus Herceptin.  He has had increased weakness and fatigue over the past week and has requested a delay in treatment. He reports an improved appetite and swallowing, but still has continued weight loss. He has no neurologic complaints. He denies any fevers. He denies any chest pain or shortness of breath. He denies any nausea, vomiting, constipation, or diarrhea. He denies any melena or hematochezia. Patient offers no further specific complaints.  REVIEW OF SYSTEMS:   Review of Systems  Constitutional: Positive for malaise/fatigue and weight loss. Negative for fever.  Respiratory: Negative.  Negative for cough and shortness of breath.   Cardiovascular: Negative.  Negative for chest pain.  Gastrointestinal: Negative for abdominal pain, blood in stool, melena and vomiting.  Musculoskeletal: Negative.   Skin: Negative.   Neurological: Positive for weakness.  Psychiatric/Behavioral: Negative.  The patient is not nervous/anxious.     As per HPI. Otherwise, a complete review of systems is negative.  PAST MEDICAL HISTORY: Past Medical History:  Diagnosis Date  . Anemia   . Arthritis   . 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 Diabetes mellitus without complication ([place])   . Ectatic abdominal aorta ([place]) 08/12/2015  . Esophageal cancer ([place])   . GI  bleed   . Hearing loss    left ear  . Hypertension   . Hypothyroidism   . Liver mass   . Mild left atrial enlargement 08/12/2015   Very mild; LA 4.2 cm; echo March 2017  . Mitral valvular regurgitation 08/12/2015  . Sleep apnea   . Stroke ([place])   . TIA (transient ischemic attack)   . 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 APPENDECTOMY    . EUS N/A 10/07/2015   Procedure: FULL UPPER ENDOSCOPIC ULTRASOUND (EUS) RADIAL;  Surgeon: Holly Bodily, MD;  Location: [facility] ENDOSCOPY;  Service: Endoscopy;  Laterality: N/A;  . EYE SURGERY    . FRACTURE SURGERY    . NOSE SURGERY    . PERIPHERAL VASCULAR CATHETERIZATION N/A 10/26/2015   Procedure: Glori Luis Cath Insertion;  Surgeon: Katha Cabal, MD;  Location: [place] [facility];  Service: Cardiovascular;  Laterality: N/A;    FAMILY HISTORY Family History  Problem Relation Age of Onset  . Hypertension Mother   . Lung cancer Father   . Hypertension Brother        ADVANCED DIRECTIVES:    HEALTH MAINTENANCE: Social History  Substance Use Topics  . Smoking status: Former Smoker    Packs/day: 1.00    Types: Cigarettes    Quit date: 10/03/1983  . Smokeless tobacco: Never Used  . Alcohol use 0.0 oz/week     Comment: rarely     Colonoscopy:  PAP:  Bone density:  Lipid panel:  Allergies  Allergen Reactions  . Sulfa Antibiotics Itching and Hives  . Amoxicillin Hives  .  Ampicillin Hives  . Keflex [Cephalexin] Itching  . Lisinopril Other (See Comments)  . Losartan Other (See Comments)  . Sulfamethoxazole-Trimethoprim Other (See Comments)  . Cephalosporins Hives    Current Outpatient Prescriptions  Medication Sig Dispense Refill  . aspirin EC 81 MG tablet Take 81 mg by mouth 2 (two) times daily.     . chlorpheniramine-HYDROcodone (TUSSIONEX) 10-8 MG/5ML SUER Take 5 mLs by mouth every 12 (twelve) hours as needed for cough.  240 mL 0  . Cholecalciferol (VITAMIN D3) 1000 UNITS CAPS Take 1,000 Units by mouth daily.     . isosorbide mononitrate (IMDUR) 60 MG 24 hr tablet Take 1 tablet (60 mg total) by mouth daily. 30 tablet 0  . levothyroxine (SYNTHROID, LEVOTHROID) 100 MCG tablet Take 100 mcg by mouth every morning.    . lidocaine-prilocaine (EMLA) cream Apply 1 application topically as needed. Apply to port 1-2 hours prior to chemotherapy. Cover with plastic wrap. 30 g 2  . Lutein 40 MG CAPS Take by mouth.    . Magnesium Oxide 250 MG TABS Take 1 tablet (250 mg total) by mouth daily.  0  . metFORMIN (GLUCOPHAGE) 1000 MG tablet Take 2,000 mg by mouth daily.    . metoprolol tartrate (LOPRESSOR) 25 MG tablet Take 12.5 mg by mouth 2 (two) times daily.     . Misc Natural Products (BEE PROPOLIS PO) Take 1 tablet by mouth 2 (two) times daily.     . Multiple Vitamins-Minerals (MULTI FOR HIM 50+ PO) Take 1 tablet by mouth daily.    . Omega-3 Fatty Acids (FISH OIL) 1000 MG CAPS Take 1 capsule by mouth daily.    . ondansetron (ZOFRAN) 8 MG tablet Take 1 tablet (8 mg total) by mouth every 8 (eight) hours as needed for nausea or vomiting. 30 tablet 2  . prochlorperazine (COMPAZINE) 10 MG tablet Take 1 tablet (10 mg total) by mouth every 6 (six) hours as needed for nausea or vomiting. 30 tablet 2  . Red Yeast Rice 600 MG TABS Take 2 tablets by mouth at bedtime.    . sucralfate (CARAFATE) 1 g tablet Take 1 tablet (1 g total) by mouth 3 (three) times daily. Dissolve each tablet in 2-3 tbsp warm water, swish and swallow. 90 tablet 2  . Coenzyme Q10 (CO Q-10) 200 MG CAPS Take by mouth.    . doxycycline (VIBRA-TABS) 100 MG tablet Take 1 tablet (100 mg total) by mouth 2 (two) times daily. 14 tablet 0   No current facility-administered medications for this visit.    Facility-Administered Medications Ordered in Other Visits  Medication Dose Route Frequency Provider Last Rate Last Dose  . 0.9 %  sodium chloride infusion   Intravenous  Continuous Lloyd Huger, MD        OBJECTIVE: Vitals:   01/12/16 0919  BP: 137/82  Pulse: 78  Resp: 18  Temp: 97.8 F (36.6 C)     Body mass index is 19.32 kg/m.    ECOG FS:1 - Symptomatic but completely ambulatory  General: Well-developed, well-nourished, no acute distress. Eyes: Pink conjunctiva, anicteric sclera. Lungs: Clear to auscultation bilaterally. Heart: Regular rate and rhythm. No rubs, murmurs, or gallops. Abdomen: Soft, nontender, nondistended. No organomegaly noted, normoactive bowel sounds. Musculoskeletal: No edema, cyanosis, or clubbing. Neuro: Alert, answering all questions appropriately. Cranial nerves grossly intact. Skin: No rashes or petechiae noted. Psych: Normal affect.   LAB RESULTS:  Lab Results  Component Value Date   NA 135 01/12/2016   K  3.3 (L) 01/12/2016   CL 96 (L) 01/12/2016   CO2 28 01/12/2016   GLUCOSE 142 (H) 01/12/2016   BUN 11 01/12/2016   CREATININE 0.56 (L) 01/12/2016   CALCIUM 8.9 01/12/2016   PROT 6.2 (L) 01/12/2016   ALBUMIN 3.1 (L) 01/12/2016   AST 33 01/12/2016   ALT 16 (L) 01/12/2016   ALKPHOS 56 01/12/2016   BILITOT 0.3 01/12/2016   GFRNONAA >60 01/12/2016   GFRAA >60 01/12/2016    Lab Results  Component Value Date   WBC 14.2 (H) 01/12/2016   NEUTROABS 12.2 (H) 01/12/2016   HGB 9.7 (L) 01/12/2016   HCT 29.4 (L) 01/12/2016   MCV 84.7 01/12/2016   PLT 474 (H) 01/12/2016   Lab Results  Component Value Date   IRON 31 (L) 12/22/2015   TIBC 400 12/22/2015   IRONPCTSAT 8 (L) 12/22/2015    Lab Results  Component Value Date   FERRITIN 32 12/22/2015   Lab Results  Component Value Date   CA199 3,291 (H) 01/12/2016   Lab Results  Component Value Date   CEA 56.9 (H) 01/12/2016     STUDIES: Dg Chest 2 View  Result Date: 12/31/2015 CLINICAL DATA:  Cough and fever on chemotherapy for esophageal malignancy EXAM: CHEST  2 VIEW COMPARISON:  Chest x-ray of September 17, 2015 and PET-CT study of October 11, 2015. FINDINGS: The lungs are adequately inflated. There is no focal infiltrate. There is no pleural effusion. No pulmonary parenchymal masses are observed. The heart and pulmonary vascularity are normal. There is calcification in the wall of the aortic arch. The porta catheter tip projects over the distal third of the SVC. The observed bony thorax exhibits no acute abnormality. IMPRESSION: No pneumonia nor other acute cardiopulmonary abnormality. Thoracic aortic atherosclerosis. Electronically Signed   By: David  Martinique M.D.   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  PLAN:    1. Stage IV adenocarcinoma of the lower third esophagus, HER-2 positive, with liver metastasis: PET scan results reviewed independently with metastatic disease in liver. Biopsy confirmed adenocarcinoma consistent with esophageal origin. MUGA scan on November 22, 2015 reported an EF of 52%. CA-19-9 and CEA are trending down dramatically. Delay cycle 6 of FOLFOX plus Herceptin today.  Patient has now completed his XRT. Return to clinic in 1 week for reconsideration of treatment. Patient will also require MUGA in the near future.  2. Iron deficiency anemia: Consider IV iron in the future. 3. Recent MI: Treatment per cardiology. MUGA as above. 4. Weight loss: Likely secondary to underlying malignancy, monitor. 5. Dysphagia: XRT as above. 6. Neutropenia: Will add a Neulasta with the remainder of his cycles. 7. Weakness and fatigue: Multifactorial. Patient will receive 1 L IV fluids today. Delay treatment as above.  Patient expressed understanding and was in agreement with this plan. He also understands that He can call clinic at any time with any questions, concerns, or complaints.    Lloyd Huger, MD   01/16/2016 8:20 AM

## 2016-01-12 ENCOUNTER — Inpatient Hospital Stay (HOSPITAL_BASED_OUTPATIENT_CLINIC_OR_DEPARTMENT_OTHER): Payer: Medicare Other | Admitting: Oncology

## 2016-01-12 ENCOUNTER — Inpatient Hospital Stay: Payer: Medicare Other

## 2016-01-12 VITALS — BP 137/82 | HR 78 | Temp 97.8°F | Resp 18 | Wt 123.3 lb

## 2016-01-12 DIAGNOSIS — M5134 Other intervertebral disc degeneration, thoracic region: Secondary | ICD-10-CM

## 2016-01-12 DIAGNOSIS — R5383 Other fatigue: Secondary | ICD-10-CM

## 2016-01-12 DIAGNOSIS — C787 Secondary malignant neoplasm of liver and intrahepatic bile duct: Secondary | ICD-10-CM

## 2016-01-12 DIAGNOSIS — E119 Type 2 diabetes mellitus without complications: Secondary | ICD-10-CM

## 2016-01-12 DIAGNOSIS — D709 Neutropenia, unspecified: Secondary | ICD-10-CM

## 2016-01-12 DIAGNOSIS — Z7984 Long term (current) use of oral hypoglycemic drugs: Secondary | ICD-10-CM

## 2016-01-12 DIAGNOSIS — D509 Iron deficiency anemia, unspecified: Secondary | ICD-10-CM

## 2016-01-12 DIAGNOSIS — Z7689 Persons encountering health services in other specified circumstances: Secondary | ICD-10-CM

## 2016-01-12 DIAGNOSIS — I77811 Abdominal aortic ectasia: Secondary | ICD-10-CM

## 2016-01-12 DIAGNOSIS — C155 Malignant neoplasm of lower third of esophagus: Secondary | ICD-10-CM

## 2016-01-12 DIAGNOSIS — R634 Abnormal weight loss: Secondary | ICD-10-CM

## 2016-01-12 DIAGNOSIS — I700 Atherosclerosis of aorta: Secondary | ICD-10-CM

## 2016-01-12 DIAGNOSIS — Z5111 Encounter for antineoplastic chemotherapy: Secondary | ICD-10-CM | POA: Diagnosis not present

## 2016-01-12 DIAGNOSIS — Z79899 Other long term (current) drug therapy: Secondary | ICD-10-CM

## 2016-01-12 DIAGNOSIS — Z8719 Personal history of other diseases of the digestive system: Secondary | ICD-10-CM

## 2016-01-12 DIAGNOSIS — G4730 Sleep apnea, unspecified: Secondary | ICD-10-CM

## 2016-01-12 DIAGNOSIS — Z801 Family history of malignant neoplasm of trachea, bronchus and lung: Secondary | ICD-10-CM

## 2016-01-12 DIAGNOSIS — Z5112 Encounter for antineoplastic immunotherapy: Secondary | ICD-10-CM | POA: Diagnosis not present

## 2016-01-12 DIAGNOSIS — E039 Hypothyroidism, unspecified: Secondary | ICD-10-CM

## 2016-01-12 DIAGNOSIS — I10 Essential (primary) hypertension: Secondary | ICD-10-CM

## 2016-01-12 DIAGNOSIS — Z8673 Personal history of transient ischemic attack (TIA), and cerebral infarction without residual deficits: Secondary | ICD-10-CM

## 2016-01-12 DIAGNOSIS — M129 Arthropathy, unspecified: Secondary | ICD-10-CM

## 2016-01-12 DIAGNOSIS — I517 Cardiomegaly: Secondary | ICD-10-CM

## 2016-01-12 DIAGNOSIS — R531 Weakness: Secondary | ICD-10-CM

## 2016-01-12 DIAGNOSIS — Z87891 Personal history of nicotine dependence: Secondary | ICD-10-CM

## 2016-01-12 DIAGNOSIS — Q2546 Tortuous aortic arch: Secondary | ICD-10-CM

## 2016-01-12 DIAGNOSIS — Z7982 Long term (current) use of aspirin: Secondary | ICD-10-CM

## 2016-01-12 DIAGNOSIS — R1310 Dysphagia, unspecified: Secondary | ICD-10-CM

## 2016-01-12 LAB — CBC WITH DIFFERENTIAL/PLATELET
Basophils Absolute: 0.1 10*3/uL (ref 0–0.1)
Basophils Relative: 1 %
EOS ABS: 0 10*3/uL (ref 0–0.7)
EOS PCT: 0 %
HCT: 29.4 % — ABNORMAL LOW (ref 40.0–52.0)
HEMOGLOBIN: 9.7 g/dL — AB (ref 13.0–18.0)
LYMPHS ABS: 0.4 10*3/uL — AB (ref 1.0–3.6)
Lymphocytes Relative: 3 %
MCH: 27.9 pg (ref 26.0–34.0)
MCHC: 32.9 g/dL (ref 32.0–36.0)
MCV: 84.7 fL (ref 80.0–100.0)
MONOS PCT: 10 %
Monocytes Absolute: 1.3 10*3/uL — ABNORMAL HIGH (ref 0.2–1.0)
Neutro Abs: 12.2 10*3/uL — ABNORMAL HIGH (ref 1.4–6.5)
Neutrophils Relative %: 86 %
PLATELETS: 474 10*3/uL — AB (ref 150–440)
RBC: 3.47 MIL/uL — ABNORMAL LOW (ref 4.40–5.90)
RDW: 24.7 % — ABNORMAL HIGH (ref 11.5–14.5)
WBC: 14.2 10*3/uL — ABNORMAL HIGH (ref 3.8–10.6)

## 2016-01-12 LAB — COMPREHENSIVE METABOLIC PANEL
ALK PHOS: 56 U/L (ref 38–126)
ALT: 16 U/L — ABNORMAL LOW (ref 17–63)
ANION GAP: 11 (ref 5–15)
AST: 33 U/L (ref 15–41)
Albumin: 3.1 g/dL — ABNORMAL LOW (ref 3.5–5.0)
BUN: 11 mg/dL (ref 6–20)
CALCIUM: 8.9 mg/dL (ref 8.9–10.3)
CO2: 28 mmol/L (ref 22–32)
Chloride: 96 mmol/L — ABNORMAL LOW (ref 101–111)
Creatinine, Ser: 0.56 mg/dL — ABNORMAL LOW (ref 0.61–1.24)
GFR calc non Af Amer: >60 mL/min (ref >60)
Glucose, Bld: 142 mg/dL — ABNORMAL HIGH (ref 65–99)
Potassium: 3.3 mmol/L — ABNORMAL LOW (ref 3.5–5.1)
SODIUM: 135 mmol/L (ref 135–145)
Total Bilirubin: 0.3 mg/dL (ref 0.3–1.2)
Total Protein: 6.2 g/dL — ABNORMAL LOW (ref 6.5–8.1)

## 2016-01-12 MED ORDER — SODIUM CHLORIDE 0.9 % IJ SOLN
10.0000 mL | Freq: Once | INTRAMUSCULAR | Status: AC
  Administered 2016-01-12: 10 mL via INTRAVENOUS
  Filled 2016-01-12: qty 10

## 2016-01-12 MED ORDER — HEPARIN SOD (PORK) LOCK FLUSH 100 UNIT/ML IV SOLN
500.0000 [IU] | Freq: Once | INTRAVENOUS | Status: AC
  Administered 2016-01-12: 500 [IU] via INTRAVENOUS
  Filled 2016-01-12: qty 5

## 2016-01-12 MED ORDER — SODIUM CHLORIDE 0.9 % IV SOLN
Freq: Once | INTRAVENOUS | Status: AC
  Administered 2016-01-12: 10:00:00 via INTRAVENOUS
  Filled 2016-01-12: qty 1000

## 2016-01-12 NOTE — Progress Notes (Signed)
Appetite is slowly improving. Pt feels weak today and requests to hold treatment.

## 2016-01-13 LAB — CANCER ANTIGEN 19-9: CA 19-9: 3291 U/mL — ABNORMAL HIGH (ref 0–35)

## 2016-01-13 LAB — CEA: CEA: 56.9 ng/mL — ABNORMAL HIGH (ref 0.0–4.7)

## 2016-01-14 ENCOUNTER — Inpatient Hospital Stay: Payer: Medicare Other

## 2016-01-18 ENCOUNTER — Encounter: Payer: Self-pay | Admitting: Family Medicine

## 2016-01-18 DIAGNOSIS — D709 Neutropenia, unspecified: Secondary | ICD-10-CM | POA: Diagnosis not present

## 2016-01-18 DIAGNOSIS — C155 Malignant neoplasm of lower third of esophagus: Secondary | ICD-10-CM | POA: Diagnosis not present

## 2016-01-18 DIAGNOSIS — C787 Secondary malignant neoplasm of liver and intrahepatic bile duct: Secondary | ICD-10-CM | POA: Diagnosis not present

## 2016-01-18 DIAGNOSIS — D509 Iron deficiency anemia, unspecified: Secondary | ICD-10-CM | POA: Diagnosis not present

## 2016-01-18 DIAGNOSIS — R634 Abnormal weight loss: Secondary | ICD-10-CM | POA: Diagnosis not present

## 2016-01-18 DIAGNOSIS — Z9181 History of falling: Secondary | ICD-10-CM | POA: Diagnosis not present

## 2016-01-18 NOTE — Progress Notes (Signed)
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  INTERVAL HISTORY:  Patient returns to clinic today for further evaluation and reconsideration of cycle 6 of FOLFOX plus Herceptin.  His appetite has improved slightly and his weight has stabilized. He continues to have weakness and fatigue. He reports an improved appetite and swallowing. He has no neurologic complaints. He denies any fevers. He denies any chest pain or shortness of breath. He denies any nausea, vomiting, constipation, or diarrhea. He denies any melena or hematochezia. Patient offers no further specific complaints.  REVIEW OF SYSTEMS:   Review of Systems  Constitutional: Positive for malaise/fatigue. Negative for fever and weight loss.  Respiratory: Negative.  Negative for cough and shortness of breath.   Cardiovascular: Negative.  Negative for chest pain.  Gastrointestinal: Negative for abdominal pain, blood in stool, melena and vomiting.  Genitourinary: Negative.   Musculoskeletal: Negative.   Skin: Negative.   Neurological: Positive for weakness.  Psychiatric/Behavioral: Negative.  The patient is not nervous/anxious.     As per HPI. Otherwise, a complete review of systems is negative.  PAST MEDICAL HISTORY: Past Medical History:  Diagnosis Date  . Anemia   . Arthritis   . 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 Diabetes mellitus without complication ([place])   . Ectatic abdominal aorta ([place]) 08/12/2015  . Esophageal cancer ([place])   . GI  bleed   . Hearing loss    left ear  . Hypertension   . Hypothyroidism   . Liver mass   . Mild left atrial enlargement 08/12/2015   Very mild; LA 4.2 cm; echo March 2017  . Mitral valvular regurgitation 08/12/2015  . Sleep apnea   . Stroke ([place])   . TIA (transient ischemic attack)   . 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 APPENDECTOMY    . EUS N/A 10/07/2015   Procedure: FULL UPPER ENDOSCOPIC ULTRASOUND (EUS) RADIAL;  Surgeon: Holly Bodily, MD;  Location: [facility] ENDOSCOPY;  Service: Endoscopy;  Laterality: N/A;  . EYE SURGERY    . FRACTURE SURGERY    . NOSE SURGERY    . PERIPHERAL VASCULAR CATHETERIZATION N/A 10/26/2015   Procedure: Glori Luis Cath Insertion;  Surgeon: Katha Cabal, MD;  Location: [facility];  Service: Cardiovascular;  Laterality: N/A;    FAMILY HISTORY Family History  Problem Relation Age of Onset  . Hypertension Mother   . Lung cancer Father   . Hypertension Brother        ADVANCED DIRECTIVES:    HEALTH MAINTENANCE: Social History  Substance Use Topics  . Smoking status: Former Smoker    Packs/day: 1.00    Types: Cigarettes    Quit date: 10/03/1983  . Smokeless tobacco: Never Used  . Alcohol use 0.0 oz/week     Comment: rarely     Colonoscopy:  PAP:  Bone density:  Lipid panel:  Allergies  Allergen Reactions  . Sulfa Antibiotics Itching and Hives  . Amoxicillin Hives  . Ampicillin Hives  .  Keflex [Cephalexin] Itching  . Lisinopril Other (See Comments)  . Losartan Other (See Comments)  . Sulfamethoxazole-Trimethoprim Other (See Comments)  . Cephalosporins Hives    Current Outpatient Prescriptions  Medication Sig Dispense Refill  . aspirin EC 81 MG tablet Take 81 mg by mouth 2 (two) times daily.     . chlorpheniramine-HYDROcodone (TUSSIONEX) 10-8 MG/5ML SUER Take 5 mLs by mouth every 12 (twelve) hours as needed for cough.  240 mL 0  . Cholecalciferol (VITAMIN D3) 1000 UNITS CAPS Take 1,000 Units by mouth daily.     . Coenzyme Q10 (CO Q-10) 200 MG CAPS Take by mouth.    . doxycycline (VIBRA-TABS) 100 MG tablet Take 1 tablet (100 mg total) by mouth 2 (two) times daily. 14 tablet 0  . isosorbide mononitrate (IMDUR) 60 MG 24 hr tablet Take 1 tablet (60 mg total) by mouth daily. 30 tablet 0  . levothyroxine (SYNTHROID, LEVOTHROID) 100 MCG tablet Take 100 mcg by mouth every morning.    . lidocaine-prilocaine (EMLA) cream Apply 1 application topically as needed. Apply to port 1-2 hours prior to chemotherapy. Cover with plastic wrap. 30 g 2  . Lutein 40 MG CAPS Take by mouth.    . Magnesium Oxide 250 MG TABS Take 1 tablet (250 mg total) by mouth daily.  0  . metFORMIN (GLUCOPHAGE) 1000 MG tablet Take 2,000 mg by mouth daily.    . metoprolol tartrate (LOPRESSOR) 25 MG tablet Take 12.5 mg by mouth 2 (two) times daily.     . Misc Natural Products (BEE PROPOLIS PO) Take 1 tablet by mouth 2 (two) times daily.     . Multiple Vitamins-Minerals (MULTI FOR HIM 50+ PO) Take 1 tablet by mouth daily.    . Omega-3 Fatty Acids (FISH OIL) 1000 MG CAPS Take 1 capsule by mouth daily.    . ondansetron (ZOFRAN) 8 MG tablet Take 1 tablet (8 mg total) by mouth every 8 (eight) hours as needed for nausea or vomiting. 30 tablet 2  . prochlorperazine (COMPAZINE) 10 MG tablet Take 1 tablet (10 mg total) by mouth every 6 (six) hours as needed for nausea or vomiting. 30 tablet 2  . Red Yeast Rice 600 MG TABS Take 2 tablets by mouth at bedtime.    . sucralfate (CARAFATE) 1 g tablet Take 1 tablet (1 g total) by mouth 3 (three) times daily. Dissolve each tablet in 2-3 tbsp warm water, swish and swallow. 90 tablet 2   No current facility-administered medications for this visit.    Facility-Administered Medications Ordered in Other Visits  Medication Dose Route Frequency Provider Last Rate Last Dose  . 0.9 %  sodium chloride infusion   Intravenous  Continuous Lloyd Huger, MD        OBJECTIVE: Vitals:   01/19/16 1018  BP: 128/70  Pulse: 76  Resp: 18  Temp: 98.7 F (37.1 C)     Body mass index is 19.42 kg/m.    ECOG FS:1 - Symptomatic but completely ambulatory  General: Well-developed, well-nourished, no acute distress. Eyes: Pink conjunctiva, anicteric sclera. Lungs: Clear to auscultation bilaterally. Heart: Regular rate and rhythm. No rubs, murmurs, or gallops. Abdomen: Soft, nontender, nondistended. No organomegaly noted, normoactive bowel sounds. Musculoskeletal: No edema, cyanosis, or clubbing. Neuro: Alert, answering all questions appropriately. Cranial nerves grossly intact. Skin: No rashes or petechiae noted. Psych: Normal affect.   LAB RESULTS:  Lab Results  Component Value Date   NA 133 (L) 01/19/2016   K 3.8 01/19/2016  CL 95 (L) 01/19/2016   CO2 29 01/19/2016   GLUCOSE 188 (H) 01/19/2016   BUN 10 01/19/2016   CREATININE 0.55 (L) 01/19/2016   CALCIUM 8.9 01/19/2016   PROT 6.5 01/19/2016   ALBUMIN 3.3 (L) 01/19/2016   AST 31 01/19/2016   ALT 14 (L) 01/19/2016   ALKPHOS 60 01/19/2016   BILITOT 0.4 01/19/2016   GFRNONAA >60 01/19/2016   GFRAA >60 01/19/2016    Lab Results  Component Value Date   WBC 10.9 (H) 01/19/2016   NEUTROABS 9.0 (H) 01/19/2016   HGB 9.9 (L) 01/19/2016   HCT 29.4 (L) 01/19/2016   MCV 85.3 01/19/2016   PLT 486 (H) 01/19/2016   Lab Results  Component Value Date   IRON 31 (L) 12/22/2015   TIBC 400 12/22/2015   IRONPCTSAT 8 (L) 12/22/2015    Lab Results  Component Value Date   FERRITIN 32 12/22/2015   Lab Results  Component Value Date   CA199 3,291 (H) 01/12/2016   Lab Results  Component Value Date   CEA 56.9 (H) 01/12/2016     STUDIES: Dg Chest 2 View  Result Date: 12/31/2015 CLINICAL DATA:  Cough and fever on chemotherapy for esophageal malignancy EXAM: CHEST  2 VIEW COMPARISON:  Chest x-ray of September 17, 2015 and PET-CT study of October 11, 2015.  FINDINGS: The lungs are adequately inflated. There is no focal infiltrate. There is no pleural effusion. No pulmonary parenchymal masses are observed. The heart and pulmonary vascularity are normal. There is calcification in the wall of the aortic arch. The porta catheter tip projects over the distal third of the SVC. The observed bony thorax exhibits no acute abnormality. IMPRESSION: No pneumonia nor other acute cardiopulmonary abnormality. Thoracic aortic atherosclerosis. Electronically Signed   By: David  Martinique M.D.   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  PLAN:    1. Stage IV adenocarcinoma of the lower third esophagus, HER-2 positive, with liver metastasis: PET scan results reviewed independently with metastatic disease in liver. Biopsy confirmed adenocarcinoma consistent with esophageal origin. MUGA scan on November 22, 2015 reported an EF of 52%. CA-19-9 and CEA are trending down dramatically. Proceed with cycle 6 of FOLFOX plus Herceptin today.  Patient has now completed his XRT. Return to clinic in 3 weeks for restaging PET scan and consideration of cycle 7. Patient will also require MUGA in the near future.  2. Iron deficiency anemia: Consider IV iron in the future. 3. Recent MI: Treatment per cardiology. MUGA as above. 4. Weight loss: Improving. Likely secondary to underlying malignancy, monitor. 5. Dysphagia: XRT has been completed. 6. Neutropenia: Continue Neulasta with the remainder of his cycles. 7. Weakness and fatigue: Multifactorial. Monitor.  Patient expressed understanding and was in agreement with this plan. He also understands that He can call clinic at any time with any questions, concerns, or complaints.    Lloyd Huger, MD   01/21/2016 10:27 PM

## 2016-01-19 ENCOUNTER — Inpatient Hospital Stay (HOSPITAL_BASED_OUTPATIENT_CLINIC_OR_DEPARTMENT_OTHER): Payer: Medicare Other | Admitting: Oncology

## 2016-01-19 ENCOUNTER — Inpatient Hospital Stay: Payer: Medicare Other

## 2016-01-19 ENCOUNTER — Inpatient Hospital Stay: Payer: Medicare Other | Attending: Oncology

## 2016-01-19 VITALS — BP 128/70 | HR 76 | Temp 98.7°F | Resp 18 | Wt 124.0 lb

## 2016-01-19 DIAGNOSIS — D509 Iron deficiency anemia, unspecified: Secondary | ICD-10-CM

## 2016-01-19 DIAGNOSIS — Z7982 Long term (current) use of aspirin: Secondary | ICD-10-CM | POA: Insufficient documentation

## 2016-01-19 DIAGNOSIS — M129 Arthropathy, unspecified: Secondary | ICD-10-CM | POA: Diagnosis not present

## 2016-01-19 DIAGNOSIS — I10 Essential (primary) hypertension: Secondary | ICD-10-CM | POA: Insufficient documentation

## 2016-01-19 DIAGNOSIS — I340 Nonrheumatic mitral (valve) insufficiency: Secondary | ICD-10-CM | POA: Insufficient documentation

## 2016-01-19 DIAGNOSIS — Z5111 Encounter for antineoplastic chemotherapy: Secondary | ICD-10-CM | POA: Diagnosis not present

## 2016-01-19 DIAGNOSIS — G4730 Sleep apnea, unspecified: Secondary | ICD-10-CM | POA: Diagnosis not present

## 2016-01-19 DIAGNOSIS — M5134 Other intervertebral disc degeneration, thoracic region: Secondary | ICD-10-CM

## 2016-01-19 DIAGNOSIS — Z8719 Personal history of other diseases of the digestive system: Secondary | ICD-10-CM

## 2016-01-19 DIAGNOSIS — I517 Cardiomegaly: Secondary | ICD-10-CM

## 2016-01-19 DIAGNOSIS — R634 Abnormal weight loss: Secondary | ICD-10-CM | POA: Insufficient documentation

## 2016-01-19 DIAGNOSIS — R1310 Dysphagia, unspecified: Secondary | ICD-10-CM | POA: Diagnosis not present

## 2016-01-19 DIAGNOSIS — L97309 Non-pressure chronic ulcer of unspecified ankle with unspecified severity: Secondary | ICD-10-CM | POA: Diagnosis not present

## 2016-01-19 DIAGNOSIS — R531 Weakness: Secondary | ICD-10-CM | POA: Diagnosis not present

## 2016-01-19 DIAGNOSIS — D709 Neutropenia, unspecified: Secondary | ICD-10-CM | POA: Insufficient documentation

## 2016-01-19 DIAGNOSIS — Z8673 Personal history of transient ischemic attack (TIA), and cerebral infarction without residual deficits: Secondary | ICD-10-CM | POA: Insufficient documentation

## 2016-01-19 DIAGNOSIS — C787 Secondary malignant neoplasm of liver and intrahepatic bile duct: Secondary | ICD-10-CM | POA: Diagnosis not present

## 2016-01-19 DIAGNOSIS — I77811 Abdominal aortic ectasia: Secondary | ICD-10-CM

## 2016-01-19 DIAGNOSIS — R5383 Other fatigue: Secondary | ICD-10-CM | POA: Diagnosis not present

## 2016-01-19 DIAGNOSIS — Z79899 Other long term (current) drug therapy: Secondary | ICD-10-CM

## 2016-01-19 DIAGNOSIS — C155 Malignant neoplasm of lower third of esophagus: Secondary | ICD-10-CM

## 2016-01-19 DIAGNOSIS — E039 Hypothyroidism, unspecified: Secondary | ICD-10-CM | POA: Insufficient documentation

## 2016-01-19 DIAGNOSIS — Z87891 Personal history of nicotine dependence: Secondary | ICD-10-CM | POA: Insufficient documentation

## 2016-01-19 DIAGNOSIS — Z7689 Persons encountering health services in other specified circumstances: Secondary | ICD-10-CM | POA: Insufficient documentation

## 2016-01-19 DIAGNOSIS — Z9049 Acquired absence of other specified parts of digestive tract: Secondary | ICD-10-CM | POA: Insufficient documentation

## 2016-01-19 DIAGNOSIS — Z5112 Encounter for antineoplastic immunotherapy: Secondary | ICD-10-CM | POA: Diagnosis not present

## 2016-01-19 DIAGNOSIS — E119 Type 2 diabetes mellitus without complications: Secondary | ICD-10-CM | POA: Insufficient documentation

## 2016-01-19 DIAGNOSIS — I252 Old myocardial infarction: Secondary | ICD-10-CM | POA: Diagnosis not present

## 2016-01-19 DIAGNOSIS — Z801 Family history of malignant neoplasm of trachea, bronchus and lung: Secondary | ICD-10-CM

## 2016-01-19 DIAGNOSIS — Z7984 Long term (current) use of oral hypoglycemic drugs: Secondary | ICD-10-CM

## 2016-01-19 DIAGNOSIS — Q2546 Tortuous aortic arch: Secondary | ICD-10-CM | POA: Insufficient documentation

## 2016-01-19 LAB — COMPREHENSIVE METABOLIC PANEL
ALT: 14 U/L — AB (ref 17–63)
AST: 31 U/L (ref 15–41)
Albumin: 3.3 g/dL — ABNORMAL LOW (ref 3.5–5.0)
Alkaline Phosphatase: 60 U/L (ref 38–126)
Anion gap: 9 (ref 5–15)
BUN: 10 mg/dL (ref 6–20)
CHLORIDE: 95 mmol/L — AB (ref 101–111)
CO2: 29 mmol/L (ref 22–32)
CREATININE: 0.55 mg/dL — AB (ref 0.61–1.24)
Calcium: 8.9 mg/dL (ref 8.9–10.3)
GFR calc Af Amer: >60 mL/min (ref >60)
Glucose, Bld: 188 mg/dL — ABNORMAL HIGH (ref 65–99)
Potassium: 3.8 mmol/L (ref 3.5–5.1)
Sodium: 133 mmol/L — ABNORMAL LOW (ref 135–145)
Total Bilirubin: 0.4 mg/dL (ref 0.3–1.2)
Total Protein: 6.5 g/dL (ref 6.5–8.1)

## 2016-01-19 LAB — CBC WITH DIFFERENTIAL/PLATELET
BASOS ABS: 0.1 10*3/uL (ref 0–0.1)
Basophils Relative: 1 %
EOS PCT: 0 %
Eosinophils Absolute: 0 10*3/uL (ref 0–0.7)
HCT: 29.4 % — ABNORMAL LOW (ref 40.0–52.0)
HEMOGLOBIN: 9.9 g/dL — AB (ref 13.0–18.0)
LYMPHS PCT: 3 %
Lymphs Abs: 0.4 10*3/uL — ABNORMAL LOW (ref 1.0–3.6)
MCH: 28.8 pg (ref 26.0–34.0)
MCHC: 33.8 g/dL (ref 32.0–36.0)
MCV: 85.3 fL (ref 80.0–100.0)
Monocytes Absolute: 1.4 10*3/uL — ABNORMAL HIGH (ref 0.2–1.0)
Monocytes Relative: 13 %
NEUTROS ABS: 9 10*3/uL — AB (ref 1.4–6.5)
NEUTROS PCT: 83 %
PLATELETS: 486 10*3/uL — AB (ref 150–440)
RBC: 3.44 MIL/uL — AB (ref 4.40–5.90)
RDW: 24.4 % — ABNORMAL HIGH (ref 11.5–14.5)
WBC: 10.9 10*3/uL — AB (ref 3.8–10.6)

## 2016-01-19 MED ORDER — TRASTUZUMAB CHEMO 150 MG IV SOLR
4.0000 mg/kg | Freq: Once | INTRAVENOUS | Status: AC
  Administered 2016-01-19: 231 mg via INTRAVENOUS
  Filled 2016-01-19: qty 11

## 2016-01-19 MED ORDER — LEUCOVORIN CALCIUM INJECTION 350 MG
700.0000 mg | Freq: Once | INTRAVENOUS | Status: AC
  Administered 2016-01-19: 700 mg via INTRAVENOUS
  Filled 2016-01-19: qty 17.5

## 2016-01-19 MED ORDER — ACETAMINOPHEN 325 MG PO TABS
650.0000 mg | ORAL_TABLET | Freq: Once | ORAL | Status: AC
  Administered 2016-01-19: 650 mg via ORAL
  Filled 2016-01-19: qty 2

## 2016-01-19 MED ORDER — DEXAMETHASONE SODIUM PHOSPHATE 10 MG/ML IJ SOLN
10.0000 mg | Freq: Once | INTRAMUSCULAR | Status: AC
  Administered 2016-01-19: 10 mg via INTRAVENOUS
  Filled 2016-01-19: qty 1

## 2016-01-19 MED ORDER — SODIUM CHLORIDE 0.9 % IV SOLN: 10.0000 mg | Freq: Once | INTRAVENOUS | Status: DC

## 2016-01-19 MED ORDER — DEXTROSE 5 % IV SOLN
85.0000 mg/m2 | Freq: Once | INTRAVENOUS | Status: AC
  Administered 2016-01-19: 150 mg via INTRAVENOUS
  Filled 2016-01-19: qty 20

## 2016-01-19 MED ORDER — FLUOROURACIL CHEMO INJECTION 2.5 GM/50ML
400.0000 mg/m2 | Freq: Once | INTRAVENOUS | Status: AC
  Administered 2016-01-19: 700 mg via INTRAVENOUS
  Filled 2016-01-19: qty 14

## 2016-01-19 MED ORDER — DEXTROSE 5 % IV SOLN
Freq: Once | INTRAVENOUS | Status: AC
  Administered 2016-01-19: 13:00:00 via INTRAVENOUS
  Filled 2016-01-19: qty 1000

## 2016-01-19 MED ORDER — SODIUM CHLORIDE 0.9 % IV SOLN
Freq: Once | INTRAVENOUS | Status: AC
  Administered 2016-01-19: 12:00:00 via INTRAVENOUS
  Filled 2016-01-19: qty 1000

## 2016-01-19 MED ORDER — TRASTUZUMAB CHEMO 150 MG IV SOLR: 4.0000 mg/kg | Freq: Once | INTRAVENOUS | Status: DC

## 2016-01-19 MED ORDER — PALONOSETRON HCL INJECTION 0.25 MG/5ML
0.2500 mg | Freq: Once | INTRAVENOUS | Status: AC
  Administered 2016-01-19: 0.25 mg via INTRAVENOUS
  Filled 2016-01-19: qty 5

## 2016-01-19 MED ORDER — SODIUM CHLORIDE 0.9 % IV SOLN
2400.0000 mg/m2 | INTRAVENOUS | Status: DC
  Administered 2016-01-19: 4200 mg via INTRAVENOUS
  Filled 2016-01-19: qty 84

## 2016-01-19 MED ORDER — SODIUM CHLORIDE 0.9% FLUSH
10.0000 mL | INTRAVENOUS | Status: DC | PRN
  Administered 2016-01-19: 10 mL
  Filled 2016-01-19: qty 10

## 2016-01-19 MED ORDER — DIPHENHYDRAMINE HCL 25 MG PO CAPS
25.0000 mg | ORAL_CAPSULE | Freq: Once | ORAL | Status: AC
  Administered 2016-01-19: 25 mg via ORAL
  Filled 2016-01-19: qty 1

## 2016-01-19 NOTE — Progress Notes (Signed)
Offers no complaints. Per pt's wife, appetite has slighty improved. Also has been weak and tired.

## 2016-01-21 ENCOUNTER — Inpatient Hospital Stay: Payer: Medicare Other

## 2016-01-21 DIAGNOSIS — Z7689 Persons encountering health services in other specified circumstances: Secondary | ICD-10-CM | POA: Diagnosis not present

## 2016-01-21 DIAGNOSIS — R634 Abnormal weight loss: Secondary | ICD-10-CM | POA: Diagnosis not present

## 2016-01-21 DIAGNOSIS — Z9181 History of falling: Secondary | ICD-10-CM | POA: Diagnosis not present

## 2016-01-21 DIAGNOSIS — Z5111 Encounter for antineoplastic chemotherapy: Secondary | ICD-10-CM | POA: Diagnosis not present

## 2016-01-21 DIAGNOSIS — D709 Neutropenia, unspecified: Secondary | ICD-10-CM | POA: Diagnosis not present

## 2016-01-21 DIAGNOSIS — Z5112 Encounter for antineoplastic immunotherapy: Secondary | ICD-10-CM | POA: Diagnosis not present

## 2016-01-21 DIAGNOSIS — C801 Malignant (primary) neoplasm, unspecified: Secondary | ICD-10-CM

## 2016-01-21 DIAGNOSIS — C155 Malignant neoplasm of lower third of esophagus: Secondary | ICD-10-CM

## 2016-01-21 DIAGNOSIS — D509 Iron deficiency anemia, unspecified: Secondary | ICD-10-CM | POA: Diagnosis not present

## 2016-01-21 DIAGNOSIS — C787 Secondary malignant neoplasm of liver and intrahepatic bile duct: Secondary | ICD-10-CM | POA: Diagnosis not present

## 2016-01-21 MED ORDER — SODIUM CHLORIDE 0.9 % IJ SOLN
10.0000 mL | Freq: Once | INTRAMUSCULAR | Status: AC
Start: 2016-01-21 — End: 2016-01-21
  Administered 2016-01-21: 10 mL via INTRAVENOUS
  Filled 2016-01-21: qty 10

## 2016-01-21 MED ORDER — HEPARIN SOD (PORK) LOCK FLUSH 100 UNIT/ML IV SOLN
500.0000 [IU] | Freq: Once | INTRAVENOUS | Status: AC
Start: 2016-01-21 — End: 2016-01-21
  Administered 2016-01-21: 500 [IU] via INTRAVENOUS
  Filled 2016-01-21: qty 5

## 2016-01-21 MED ORDER — PEGFILGRASTIM INJECTION 6 MG/0.6ML ~~LOC~~
6.0000 mg | PREFILLED_SYRINGE | Freq: Once | SUBCUTANEOUS | Status: AC
  Administered 2016-01-21: 6 mg via SUBCUTANEOUS
  Filled 2016-01-21: qty 0.6

## 2016-01-24 ENCOUNTER — Encounter: Payer: Self-pay | Admitting: Radiation Oncology

## 2016-01-24 ENCOUNTER — Ambulatory Visit
Admission: RE | Admit: 2016-01-24 | Discharge: 2016-01-24 | Disposition: A | Discharge status: Home / Self Care | Payer: Medicare Other | Source: Ambulatory Visit | Attending: Radiation Oncology | Admitting: Radiation Oncology

## 2016-01-24 VITALS — BP 148/73 | HR 77 | Temp 97.1°F

## 2016-01-24 DIAGNOSIS — Z923 Personal history of irradiation: Secondary | ICD-10-CM | POA: Diagnosis not present

## 2016-01-24 DIAGNOSIS — C787 Secondary malignant neoplasm of liver and intrahepatic bile duct: Secondary | ICD-10-CM | POA: Diagnosis not present

## 2016-01-24 DIAGNOSIS — C7800 Secondary malignant neoplasm of unspecified lung: Secondary | ICD-10-CM | POA: Diagnosis not present

## 2016-01-24 DIAGNOSIS — Z9221 Personal history of antineoplastic chemotherapy: Secondary | ICD-10-CM | POA: Diagnosis not present

## 2016-01-24 DIAGNOSIS — D509 Iron deficiency anemia, unspecified: Secondary | ICD-10-CM | POA: Diagnosis not present

## 2016-01-24 DIAGNOSIS — C781 Secondary malignant neoplasm of mediastinum: Secondary | ICD-10-CM | POA: Insufficient documentation

## 2016-01-24 DIAGNOSIS — C155 Malignant neoplasm of lower third of esophagus: Secondary | ICD-10-CM | POA: Diagnosis not present

## 2016-01-24 DIAGNOSIS — C7970 Secondary malignant neoplasm of unspecified adrenal gland: Secondary | ICD-10-CM | POA: Diagnosis not present

## 2016-01-24 NOTE — Progress Notes (Signed)
Radiation Oncology Follow up Note  Name: [name]   Date:   01/24/2016 MRN:  [id number] DOB: 1940/06/04    This 75 y.o. male presents to the clinic today for one-month follow-up status post concurrent chemoradiation for distal esophageal stage IV adenocarcinoma.  REFERRING PROVIDER: Arnetha Courser, MD  HPI: Patient is a 75 year old male now out 1 month having completed palliative radiation therapy to his distal esophagus for stage IV adenocarcinoma with mediastinal lung and liver and adrenal gland metastasis.. Patient has received FOLFOX plus Herceptin. He is seen today in routine follow-up is doing fairly well. He does tend towards a soft diet and is rather picky about his food according to his wife. His CA 19-9 and CEA are trending downwards. Recently diagnosed with iron deficiency anemia is on iron supplements. Overall he is doing fairly well.  COMPLICATIONS OF TREATMENT: none  FOLLOW UP COMPLIANCE: keeps appointments   PHYSICAL EXAM:  BP (!) 148/73   Pulse 77   Temp 97.1 F (36.2 C)  Well-developed well-nourished patient in NAD. HEENT reveals PERLA, EOMI, discs not visualized.  Oral cavity is clear. No oral mucosal lesions are identified. Neck is clear without evidence of cervical or supraclavicular adenopathy. Lungs are clear t A&P. Cardiac examination is essentially unremarkable with regular rate and rhythm without murmur rub or thrill. Abdomen is benign with no organomegaly or masses noted. Motor sensory and DTR levels are equal and symmetric in the upper and lower extremities. Cranial nerves II through XII are grossly intact. Proprioception is intact. No peripheral adenopathy or edema is identified. No motor or sensory levels are noted. Crude visual fields are within normal range.  RADIOLOGY RESULTS: No current films for review  PLAN: Present time patient is stable. He continues under medical oncology's direction with FOLFOX. I'm otherwise please was overall progress and is  trending downward of his lab values. I have asked to see him back in 4 months for follow-up. We'll be happy to reevaluate him at any time should further palliative treatment be indicated.  I would like to take this opportunity to thank you for allowing me to participate in the care of your patient.Armstead Peaks., MD

## 2016-01-25 DIAGNOSIS — D509 Iron deficiency anemia, unspecified: Secondary | ICD-10-CM | POA: Diagnosis not present

## 2016-01-25 DIAGNOSIS — Z9181 History of falling: Secondary | ICD-10-CM | POA: Diagnosis not present

## 2016-01-25 DIAGNOSIS — C787 Secondary malignant neoplasm of liver and intrahepatic bile duct: Secondary | ICD-10-CM | POA: Diagnosis not present

## 2016-01-25 DIAGNOSIS — C155 Malignant neoplasm of lower third of esophagus: Secondary | ICD-10-CM | POA: Diagnosis not present

## 2016-01-25 DIAGNOSIS — D709 Neutropenia, unspecified: Secondary | ICD-10-CM | POA: Diagnosis not present

## 2016-01-25 DIAGNOSIS — R634 Abnormal weight loss: Secondary | ICD-10-CM | POA: Diagnosis not present

## 2016-01-28 DIAGNOSIS — C787 Secondary malignant neoplasm of liver and intrahepatic bile duct: Secondary | ICD-10-CM | POA: Diagnosis not present

## 2016-01-28 DIAGNOSIS — D509 Iron deficiency anemia, unspecified: Secondary | ICD-10-CM | POA: Diagnosis not present

## 2016-01-28 DIAGNOSIS — Z9181 History of falling: Secondary | ICD-10-CM | POA: Diagnosis not present

## 2016-01-28 DIAGNOSIS — C155 Malignant neoplasm of lower third of esophagus: Secondary | ICD-10-CM | POA: Diagnosis not present

## 2016-01-28 DIAGNOSIS — R634 Abnormal weight loss: Secondary | ICD-10-CM | POA: Diagnosis not present

## 2016-01-28 DIAGNOSIS — D709 Neutropenia, unspecified: Secondary | ICD-10-CM | POA: Diagnosis not present

## 2016-01-31 ENCOUNTER — Telehealth: Payer: Self-pay | Admitting: *Deleted

## 2016-01-31 NOTE — Telephone Encounter (Signed)
-----   Message from Cephus Richer sent at 01/31/2016  2:13 PM EST ----- Contact: [phone] Per pt wife she wants to know something about the labs from the last time he had them done. ----- Message ----- From: Lloyd Huger, MD Sent: 01/31/2016   2:00 PM To: Jenene Slicker, RN, #  Not sure, he has not had labs done here in 12 days.Marland Kitchen   ----- Message ----- From: Cephus Richer Sent: 01/31/2016   1:42 PM To: Lloyd Huger, MD, Johney Maine, RN, #  Pt has some question about lab results?

## 2016-01-31 NOTE — Telephone Encounter (Signed)
Spoke with pt's wife regarding lab work. All questions were answered.

## 2016-02-01 ENCOUNTER — Ambulatory Visit (HOSPITAL_COMMUNITY): Payer: Medicare Other

## 2016-02-02 ENCOUNTER — Ambulatory Visit: Payer: Medicare Other

## 2016-02-02 ENCOUNTER — Encounter
Admission: RE | Admit: 2016-02-02 | Discharge: 2016-02-02 | Disposition: A | Discharge status: Home / Self Care | Payer: Medicare Other | Source: Ambulatory Visit | Attending: Oncology | Admitting: Oncology

## 2016-02-02 DIAGNOSIS — C787 Secondary malignant neoplasm of liver and intrahepatic bile duct: Secondary | ICD-10-CM | POA: Diagnosis not present

## 2016-02-02 DIAGNOSIS — C155 Malignant neoplasm of lower third of esophagus: Secondary | ICD-10-CM

## 2016-02-02 DIAGNOSIS — C159 Malignant neoplasm of esophagus, unspecified: Secondary | ICD-10-CM | POA: Diagnosis not present

## 2016-02-02 LAB — GLUCOSE, CAPILLARY
Glucose-Capillary: 75 mg/dL (ref 65–99)
Glucose-Capillary: 80 mg/dL (ref 65–99)

## 2016-02-02 MED ORDER — FLUDEOXYGLUCOSE F - 18 (FDG) INJECTION
12.9600 | Freq: Once | INTRAVENOUS | Status: AC | PRN
  Administered 2016-02-02: 12.96 via INTRAVENOUS

## 2016-02-03 ENCOUNTER — Telehealth: Payer: Self-pay | Admitting: *Deleted

## 2016-02-03 NOTE — Telephone Encounter (Signed)
-----   Message from Lloyd Huger, MD sent at 02/03/2016 12:17 PM EST ----- Per radiology:  "Considerable response to therapy "  I.e.  Treatment working well.   ----- Message ----- From: Telford Nab, RN Sent: 02/03/2016  11:46 AM To: Lloyd Huger, MD, Johney Maine, RN  Pt's wife would like PET results before appt next week. She is very anxious about the results. Can you look at the results and let me know? Thanks!

## 2016-02-03 NOTE — Telephone Encounter (Signed)
Left voicemail with pt and pt's spouse regarding PET scan results. Instructed to call back if has further questions.

## 2016-02-08 ENCOUNTER — Other Ambulatory Visit: Payer: Self-pay | Admitting: Oncology

## 2016-02-08 DIAGNOSIS — C155 Malignant neoplasm of lower third of esophagus: Secondary | ICD-10-CM

## 2016-02-08 NOTE — Progress Notes (Signed)
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  INTERVAL HISTORY:  Patient returns to clinic today for further evaluation and discussion of his imaging the results and consideration of cycle 7 of FOLFOX plus Herceptin.  His appetite has improved slightly and his weight has stabilized. He continues to have weakness and fatigue. He has a painful ulcer on his right ankle. He reports an improved appetite and swallowing. He has no neurologic complaints. He denies any fevers. He denies any chest pain or shortness of breath. He denies any nausea, vomiting, constipation, or diarrhea. He denies any melena or hematochezia. Patient offers no further specific complaints.  REVIEW OF SYSTEMS:   Review of Systems  Constitutional: Positive for malaise/fatigue. Negative for fever and weight loss.  Respiratory: Negative.  Negative for cough and shortness of breath.   Cardiovascular: Negative.  Negative for chest pain.  Gastrointestinal: Negative for abdominal pain, blood in stool, melena and vomiting.  Genitourinary: Negative.   Musculoskeletal: Negative.   Skin: Negative.   Neurological: Positive for weakness.  Psychiatric/Behavioral: Negative.  The patient is not nervous/anxious.     As per HPI. Otherwise, a complete review of systems is negative.  PAST MEDICAL HISTORY: Past Medical History:  Diagnosis Date  . Anemia   . Arthritis   . 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 Diabetes mellitus without complication ([place])    . Ectatic abdominal aorta ([place]) 08/12/2015  . Esophageal cancer ([place])   . GI bleed   . Hearing loss    left ear  . Hypertension   . Hypothyroidism   . Liver mass   . Mild left atrial enlargement 08/12/2015   Very mild; LA 4.2 cm; echo March 2017  . Mitral valvular regurgitation 08/12/2015  . Sleep apnea   . Stroke ([place])   . TIA (transient ischemic attack)   . 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 APPENDECTOMY    . EUS N/A 10/07/2015   Procedure: FULL UPPER ENDOSCOPIC ULTRASOUND (EUS) RADIAL;  Surgeon: Holly Bodily, MD;  Location: [facility] ENDOSCOPY;  Service: Endoscopy;  Laterality: N/A;  . EYE SURGERY    . FRACTURE SURGERY    . NOSE SURGERY    . PERIPHERAL VASCULAR CATHETERIZATION N/A 10/26/2015   Procedure: Glori Luis Cath Insertion;  Surgeon: Katha Cabal, MD;  Location: [facility];  Service: Cardiovascular;  Laterality: N/A;    FAMILY HISTORY Family History  Problem Relation Age of Onset  . Hypertension Mother   . Lung cancer Father   . Hypertension Brother        ADVANCED DIRECTIVES:    HEALTH MAINTENANCE: Social History  Substance Use Topics  . Smoking status: Former Smoker    Packs/day: 1.00    Types: Cigarettes    Quit date: 10/03/1983  . Smokeless tobacco: Never Used  . Alcohol use 0.0 oz/week     Comment: rarely     Colonoscopy:  PAP:  Bone density:  Lipid panel:  Allergies  Allergen  Reactions  . Sulfa Antibiotics Itching and Hives  . Amoxicillin Hives  . Ampicillin Hives  . Keflex [Cephalexin] Itching  . Lisinopril Other (See Comments)  . Losartan Other (See Comments)  . Sulfamethoxazole-Trimethoprim Other (See Comments)  . Cephalosporins Hives    Current Outpatient Prescriptions  Medication Sig Dispense Refill  . aspirin EC 81 MG tablet Take 81 mg by mouth. Two tablets (162 mg) two times daily    . Cholecalciferol (VITAMIN  D3) 1000 UNITS CAPS Take 2,000 Units by mouth daily.     . Coenzyme Q10 (CO Q-10) 200 MG CAPS Take 200 mg by mouth daily.     . isosorbide mononitrate (IMDUR) 60 MG 24 hr tablet Take 1 tablet (60 mg total) by mouth daily. 30 tablet 0  . levothyroxine (SYNTHROID, LEVOTHROID) 100 MCG tablet Take 100 mcg by mouth every morning.    . lidocaine-prilocaine (EMLA) cream Apply 1 application topically as needed. Apply to port 1-2 hours prior to chemotherapy. Cover with plastic wrap. 30 g 2  . Lutein 40 MG CAPS Take 40 mg by mouth daily.     . Magnesium Oxide 250 MG TABS Take 1 tablet (250 mg total) by mouth daily.  0  . metFORMIN (GLUCOPHAGE) 1000 MG tablet Take 2,000 mg by mouth daily.    . metoprolol tartrate (LOPRESSOR) 25 MG tablet Take 12.5 mg by mouth 2 (two) times daily.     . Misc Natural Products (BEE PROPOLIS PO) Take 1 tablet by mouth 2 (two) times daily.     . Multiple Vitamins-Minerals (MULTI FOR HIM 50+ PO) Take 1 tablet by mouth daily.    . ondansetron (ZOFRAN) 8 MG tablet Take 1 tablet (8 mg total) by mouth every 8 (eight) hours as needed for nausea or vomiting. 30 tablet 2  . prochlorperazine (COMPAZINE) 10 MG tablet Take 1 tablet (10 mg total) by mouth every 6 (six) hours as needed for nausea or vomiting. 30 tablet 2  . Red Yeast Rice 600 MG TABS Take 2 tablets by mouth at bedtime.    . sucralfate (CARAFATE) 1 g tablet Take 1 tablet (1 g total) by mouth 3 (three) times daily. Dissolve each tablet in 2-3 tbsp warm water, swish and swallow. 90 tablet 2  . doxycycline (VIBRA-TABS) 100 MG tablet Take 1 tablet (100 mg total) by mouth 2 (two) times daily. 14 tablet 0   No current facility-administered medications for this visit.    Facility-Administered Medications Ordered in Other Visits  Medication Dose Route Frequency Provider Last Rate Last Dose  . 0.9 %  sodium chloride infusion   Intravenous Continuous Lloyd Huger, MD        OBJECTIVE: Vitals:   02/09/16 0951  BP: (!)  119/50  Pulse: 64  Temp: 97.4 F (36.3 C)     Body mass index is 19.06 kg/m.    ECOG FS:1 - Symptomatic but completely ambulatory  General: Well-developed, well-nourished, no acute distress. Eyes: Pink conjunctiva, anicteric sclera. Lungs: Clear to auscultation bilaterally. Heart: Regular rate and rhythm. No rubs, murmurs, or gallops. Abdomen: Soft, nontender, nondistended. No organomegaly noted, normoactive bowel sounds. Musculoskeletal: No edema, cyanosis, or clubbing. Neuro: Alert, answering all questions appropriately. Cranial nerves grossly intact. Skin: No rashes or petechiae noted. Subcentimeter open ulcer and right lateral malleolus. Psych: Normal affect.   LAB RESULTS:  Lab Results  Component Value Date   NA 132 (L) 02/09/2016   K 4.0 02/09/2016   CL 97 (L) 02/09/2016   CO2  27 02/09/2016   GLUCOSE 166 (H) 02/09/2016   BUN 10 02/09/2016   CREATININE 0.61 02/09/2016   CALCIUM 9.2 02/09/2016   PROT 6.9 02/09/2016   ALBUMIN 3.8 02/09/2016   AST 31 02/09/2016   ALT 17 02/09/2016   ALKPHOS 80 02/09/2016   BILITOT 0.4 02/09/2016   GFRNONAA >60 02/09/2016   GFRAA >60 02/09/2016    Lab Results  Component Value Date   WBC 13.8 (H) 02/09/2016   NEUTROABS 12.0 (H) 02/09/2016   HGB 10.0 (L) 02/09/2016   HCT 30.0 (L) 02/09/2016   MCV 87.7 02/09/2016   PLT 364 02/09/2016   Lab Results  Component Value Date   IRON 31 (L) 12/22/2015   TIBC 400 12/22/2015   IRONPCTSAT 8 (L) 12/22/2015    Lab Results  Component Value Date   FERRITIN 32 12/22/2015   Lab Results  Component Value Date   CA199 6,528 (H) 02/09/2016   Lab Results  Component Value Date   CEA 96.6 (H) 02/09/2016     STUDIES: Nm Pet Image Restag (ps) Skull Base To Thigh  Result Date: 02/02/2016 CLINICAL DATA:  Subsequent treatment strategy for esophageal cancer. EXAM: NUCLEAR MEDICINE PET SKULL BASE TO THIGH TECHNIQUE: 13.0 mCi F-18 FDG was injected intravenously. Full-ring PET imaging was  performed from the skull base to thigh after the radiotracer. CT data was obtained and used for attenuation correction and anatomic localization. FASTING BLOOD GLUCOSE:  Value: 75 mg/dl COMPARISON:  10/11/2015 FINDINGS: NECK No hypermetabolic lymph nodes in the neck. Chronic left maxillary sinusitis. Right mastoid effusion. Left posterior cerebral artery distribution encephalomalacia with associated hypo activity. CHEST Right paratracheal node 1.4 cm in short axis, maximum SUV 3.1, formerly 5.5. Abnormal activity in the subcarinal lymph node appears to have resolved as has the paraesophageal adenopathy activity. Activity in the distal esophagus currently has a maximum SUV of 5.2, formerly 17.1 the Prior pulmonary nodules appear reduced in size and number compared prior exam. Index peripheral right upper lobe nodule 0.6 cm by 0.5 cm on image 84/3 without appreciable hypermetabolic activity, previously 0.7 by 0.6 cm and with a prior maximum standard uptake value of 2.1. The prior left infrahilar nodule which previously measured 9 mm in diameter currently measures about 5 mm and is no longer hypermetabolic, previous maximum standard uptake value 7.1. ABDOMEN/PELVIS Capsular focus of metabolic activity along the anterior right hepatic lobe margin without CT correlate, maximum SUV 4.1, previously 5.5 Gastrohepatic ligament nodal mass with scattered calcifications similar in size today at 6.1 by 4.3 cm, maximum SUV 4.4, previously 16.4. Left adrenal gland nodule maximum SUV 3.7, previously 8.3 Physiologic activity in the bowel. A no hypermetabolic liver intraparenchymal lesions are identified; at the site of the prior right hepatic lobe lesion with maximum SUV of 9.7 no hypermetabolic lesion is currently visible. Physiologic activity is present throughout much of the bowel. There are right anterior omental nodules including a 1.2 by 1.7 cm omental nodule on image 188/3 with maximum SUV of 4.2, generally similar to the  prior exam where there was a 1.7 by 1.9 cm omental nodule with similar SUV. Some of the small omental nodules adjacent to the gallbladder fossa are slightly larger than before. SKELETON Hypermetabolic focus along the right hip adductor musculature no demonstrates irregular rim calcification and has maximum SUV of 6.0, formerly 7.8 Former hypermetabolic focus in the right scapula is no longer hypermetabolic. Former small hypermetabolic focus along the right side of the cervicothoracic junction is no longer hypermetabolic. Small  hypermetabolic focus in the left inguinal canal is no longer hypermetabolic. IMPRESSION: 1. Considerable response to therapy with marked reduction in activity in the primary mass, resolution of some of the thoracic adenopathy with reduction in activity in the other thoracic adenopathy, resolution of the hepatic parenchymal metastatic activity, and prominent reduction in the gastrohepatic ligament mass metabolic activity. Reduction in size, number, and hypermetabolic activity of bilateral pulmonary nodules. 2. There several right anterior omental nodules of similar activity to the prior exam compatible with omental metastatic disease. 3. Hypermetabolic focus along the right hip adductor musculature demonstrates some evolutionary changes with peripheral calcifications, and has mildly reduced in standard uptake value compared to the prior exam. This could represent a metastatic focus or conceivably a traumatic lesion. 4. The former hypermetabolic focus in the right scapular tip is no longer visible. Similarly small bony foci of activity along the cervicothoracic junction and left inguinal canal are no longer apparent. 5. Chronic left maxillary sinusitis and right mastoid effusion. 6. Coronary, aortic arch, and branch vessel atherosclerotic vascular disease. Aortoiliac atherosclerotic vascular disease. Electronically Signed   By: Van Clines M.D.   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  PLAN:    1. Stage IV adenocarcinoma of the lower third esophagus, HER-2 positive, with liver metastasis: PET scan results from February 02, 2016 reviewed independently and reported as above with significant improvement of disease burden. Previously, biopsy confirmed adenocarcinoma consistent with esophageal origin. MUGA scan on November 22, 2015 reported an EF of 52%.  CA-19-9 and CEA are trending down. Proceed with cycle 7 of FOLFOX plus Herceptin today.  Patient has now completed his XRT. Return to clinic in 3 weeks for consideration of cycle 8. Patient will also require MUGA in the near future.  2. Iron deficiency anemia: Consider IV iron in the future. 3. Recent MI: Treatment per cardiology. MUGA as above. 4. Weight loss: Improving. Likely secondary to underlying malignancy, monitor. 5. Dysphagia: XRT has been completed. 6. Neutropenia: Continue Neulasta with the remainder of his cycles. 7. Weakness and fatigue: Multifactorial. Monitor. 8. Ankle ulcer: Patient was given a prescription for doxycycline and may require a wound care referral and the future.  Patient expressed understanding and was in agreement with this plan. He also understands that He can call clinic at any time with any questions, concerns, or complaints.    Lloyd Huger, MD   02/11/2016 9:42 AM

## 2016-02-09 ENCOUNTER — Inpatient Hospital Stay: Payer: Medicare Other

## 2016-02-09 ENCOUNTER — Inpatient Hospital Stay (HOSPITAL_BASED_OUTPATIENT_CLINIC_OR_DEPARTMENT_OTHER): Payer: Medicare Other | Admitting: Oncology

## 2016-02-09 VITALS — BP 119/50 | HR 64 | Temp 97.4°F | Wt 121.7 lb

## 2016-02-09 DIAGNOSIS — Z5111 Encounter for antineoplastic chemotherapy: Secondary | ICD-10-CM | POA: Diagnosis not present

## 2016-02-09 DIAGNOSIS — I517 Cardiomegaly: Secondary | ICD-10-CM

## 2016-02-09 DIAGNOSIS — E119 Type 2 diabetes mellitus without complications: Secondary | ICD-10-CM

## 2016-02-09 DIAGNOSIS — Z8673 Personal history of transient ischemic attack (TIA), and cerebral infarction without residual deficits: Secondary | ICD-10-CM

## 2016-02-09 DIAGNOSIS — R5383 Other fatigue: Secondary | ICD-10-CM

## 2016-02-09 DIAGNOSIS — G4730 Sleep apnea, unspecified: Secondary | ICD-10-CM

## 2016-02-09 DIAGNOSIS — I340 Nonrheumatic mitral (valve) insufficiency: Secondary | ICD-10-CM

## 2016-02-09 DIAGNOSIS — L97309 Non-pressure chronic ulcer of unspecified ankle with unspecified severity: Secondary | ICD-10-CM

## 2016-02-09 DIAGNOSIS — D509 Iron deficiency anemia, unspecified: Secondary | ICD-10-CM

## 2016-02-09 DIAGNOSIS — C787 Secondary malignant neoplasm of liver and intrahepatic bile duct: Secondary | ICD-10-CM

## 2016-02-09 DIAGNOSIS — Z79899 Other long term (current) drug therapy: Secondary | ICD-10-CM

## 2016-02-09 DIAGNOSIS — Z8719 Personal history of other diseases of the digestive system: Secondary | ICD-10-CM

## 2016-02-09 DIAGNOSIS — Z5112 Encounter for antineoplastic immunotherapy: Secondary | ICD-10-CM | POA: Diagnosis not present

## 2016-02-09 DIAGNOSIS — M129 Arthropathy, unspecified: Secondary | ICD-10-CM

## 2016-02-09 DIAGNOSIS — Z7689 Persons encountering health services in other specified circumstances: Secondary | ICD-10-CM | POA: Diagnosis not present

## 2016-02-09 DIAGNOSIS — R1310 Dysphagia, unspecified: Secondary | ICD-10-CM

## 2016-02-09 DIAGNOSIS — I10 Essential (primary) hypertension: Secondary | ICD-10-CM

## 2016-02-09 DIAGNOSIS — Z801 Family history of malignant neoplasm of trachea, bronchus and lung: Secondary | ICD-10-CM

## 2016-02-09 DIAGNOSIS — C155 Malignant neoplasm of lower third of esophagus: Secondary | ICD-10-CM

## 2016-02-09 DIAGNOSIS — Q2546 Tortuous aortic arch: Secondary | ICD-10-CM

## 2016-02-09 DIAGNOSIS — Z9049 Acquired absence of other specified parts of digestive tract: Secondary | ICD-10-CM

## 2016-02-09 DIAGNOSIS — R634 Abnormal weight loss: Secondary | ICD-10-CM

## 2016-02-09 DIAGNOSIS — Z7982 Long term (current) use of aspirin: Secondary | ICD-10-CM

## 2016-02-09 DIAGNOSIS — I77811 Abdominal aortic ectasia: Secondary | ICD-10-CM

## 2016-02-09 DIAGNOSIS — Z7984 Long term (current) use of oral hypoglycemic drugs: Secondary | ICD-10-CM

## 2016-02-09 DIAGNOSIS — I252 Old myocardial infarction: Secondary | ICD-10-CM

## 2016-02-09 DIAGNOSIS — R531 Weakness: Secondary | ICD-10-CM

## 2016-02-09 DIAGNOSIS — Z87891 Personal history of nicotine dependence: Secondary | ICD-10-CM

## 2016-02-09 DIAGNOSIS — D709 Neutropenia, unspecified: Secondary | ICD-10-CM

## 2016-02-09 DIAGNOSIS — E039 Hypothyroidism, unspecified: Secondary | ICD-10-CM

## 2016-02-09 DIAGNOSIS — M5134 Other intervertebral disc degeneration, thoracic region: Secondary | ICD-10-CM

## 2016-02-09 LAB — COMPREHENSIVE METABOLIC PANEL
ALT: 17 U/L (ref 17–63)
AST: 31 U/L (ref 15–41)
Albumin: 3.8 g/dL (ref 3.5–5.0)
Alkaline Phosphatase: 80 U/L (ref 38–126)
Anion gap: 8 (ref 5–15)
BUN: 10 mg/dL (ref 6–20)
CHLORIDE: 97 mmol/L — AB (ref 101–111)
CO2: 27 mmol/L (ref 22–32)
Calcium: 9.2 mg/dL (ref 8.9–10.3)
Creatinine, Ser: 0.61 mg/dL (ref 0.61–1.24)
Glucose, Bld: 166 mg/dL — ABNORMAL HIGH (ref 65–99)
POTASSIUM: 4 mmol/L (ref 3.5–5.1)
SODIUM: 132 mmol/L — AB (ref 135–145)
Total Bilirubin: 0.4 mg/dL (ref 0.3–1.2)
Total Protein: 6.9 g/dL (ref 6.5–8.1)

## 2016-02-09 LAB — CBC WITH DIFFERENTIAL/PLATELET
BASOS ABS: 0.1 10*3/uL (ref 0–0.1)
Basophils Relative: 1 %
EOS ABS: 0.2 10*3/uL (ref 0–0.7)
EOS PCT: 1 %
HCT: 30 % — ABNORMAL LOW (ref 40.0–52.0)
Hemoglobin: 10 g/dL — ABNORMAL LOW (ref 13.0–18.0)
LYMPHS PCT: 3 %
Lymphs Abs: 0.4 10*3/uL — ABNORMAL LOW (ref 1.0–3.6)
MCH: 29.1 pg (ref 26.0–34.0)
MCHC: 33.2 g/dL (ref 32.0–36.0)
MCV: 87.7 fL (ref 80.0–100.0)
MONO ABS: 1.1 10*3/uL — AB (ref 0.2–1.0)
Monocytes Relative: 8 %
Neutro Abs: 12 10*3/uL — ABNORMAL HIGH (ref 1.4–6.5)
Neutrophils Relative %: 87 %
PLATELETS: 364 10*3/uL (ref 150–440)
RBC: 3.42 MIL/uL — ABNORMAL LOW (ref 4.40–5.90)
RDW: 24.2 % — AB (ref 11.5–14.5)
WBC: 13.8 10*3/uL — AB (ref 3.8–10.6)

## 2016-02-09 MED ORDER — ACETAMINOPHEN 325 MG PO TABS
650.0000 mg | ORAL_TABLET | Freq: Once | ORAL | Status: AC
  Administered 2016-02-09: 650 mg via ORAL
  Filled 2016-02-09: qty 2

## 2016-02-09 MED ORDER — DEXTROSE 5 % IV SOLN
Freq: Once | INTRAVENOUS | Status: AC
  Administered 2016-02-09: 12:00:00 via INTRAVENOUS
  Filled 2016-02-09: qty 1000

## 2016-02-09 MED ORDER — FLUOROURACIL CHEMO INJECTION 2.5 GM/50ML
400.0000 mg/m2 | Freq: Once | INTRAVENOUS | Status: AC
  Administered 2016-02-09: 700 mg via INTRAVENOUS
  Filled 2016-02-09: qty 14

## 2016-02-09 MED ORDER — OXALIPLATIN CHEMO INJECTION 100 MG/20ML
85.0000 mg/m2 | Freq: Once | INTRAVENOUS | Status: AC
  Administered 2016-02-09: 150 mg via INTRAVENOUS
  Filled 2016-02-09: qty 10

## 2016-02-09 MED ORDER — LEUCOVORIN CALCIUM INJECTION 350 MG
700.0000 mg | Freq: Once | INTRAVENOUS | Status: AC
  Administered 2016-02-09: 700 mg via INTRAVENOUS
  Filled 2016-02-09: qty 35

## 2016-02-09 MED ORDER — DIPHENHYDRAMINE HCL 25 MG PO CAPS
25.0000 mg | ORAL_CAPSULE | Freq: Once | ORAL | Status: AC
  Administered 2016-02-09: 25 mg via ORAL
  Filled 2016-02-09: qty 1

## 2016-02-09 MED ORDER — DOXYCYCLINE HYCLATE 100 MG PO TABS: 100.0000 mg | ORAL_TABLET | Freq: Two times a day (BID) | ORAL | 0 refills | Status: DC

## 2016-02-09 MED ORDER — SODIUM CHLORIDE 0.9 % IV SOLN
Freq: Once | INTRAVENOUS | Status: AC
  Administered 2016-02-09: 11:00:00 via INTRAVENOUS
  Filled 2016-02-09: qty 1000

## 2016-02-09 MED ORDER — SODIUM CHLORIDE 0.9 % IV SOLN
2400.0000 mg/m2 | INTRAVENOUS | Status: DC
  Administered 2016-02-09: 4200 mg via INTRAVENOUS
  Filled 2016-02-09: qty 84

## 2016-02-09 MED ORDER — DEXAMETHASONE SODIUM PHOSPHATE 10 MG/ML IJ SOLN
10.0000 mg | Freq: Once | INTRAMUSCULAR | Status: AC
  Administered 2016-02-09: 10 mg via INTRAVENOUS
  Filled 2016-02-09: qty 1

## 2016-02-09 MED ORDER — PALONOSETRON HCL INJECTION 0.25 MG/5ML
0.2500 mg | Freq: Once | INTRAVENOUS | Status: AC
  Administered 2016-02-09: 0.25 mg via INTRAVENOUS
  Filled 2016-02-09: qty 5

## 2016-02-09 MED ORDER — TRASTUZUMAB CHEMO 150 MG IV SOLR
231.0000 mg | Freq: Once | INTRAVENOUS | Status: AC
  Administered 2016-02-09: 231 mg via INTRAVENOUS
  Filled 2016-02-09: qty 11

## 2016-02-09 MED ORDER — HEPARIN SOD (PORK) LOCK FLUSH 100 UNIT/ML IV SOLN: 500.0000 [IU] | Freq: Once | INTRAVENOUS | Status: DC | PRN

## 2016-02-09 MED ORDER — SODIUM CHLORIDE 0.9% FLUSH
10.0000 mL | INTRAVENOUS | Status: DC | PRN
  Administered 2016-02-09: 10 mL
  Filled 2016-02-09: qty 10

## 2016-02-09 NOTE — Progress Notes (Signed)
Patient here today for follow up.  Patient c/o painful sore on right ankle.  Patients wife would like for him to start a supplement "soy lecitchin" for brain fog

## 2016-02-10 ENCOUNTER — Telehealth: Payer: Self-pay | Admitting: *Deleted

## 2016-02-10 LAB — CANCER ANTIGEN 19-9: CA 19-9: 6528 U/mL — ABNORMAL HIGH (ref 0–35)

## 2016-02-10 LAB — CEA: CEA: 96.6 ng/mL — ABNORMAL HIGH (ref 0.0–4.7)

## 2016-02-10 NOTE — Telephone Encounter (Signed)
Per Dr. Grayland Ormond, recommended that patient and family watch skin issue on ankle since patient has several allergies to antibiotics. Patient can try another dose of Doxycycline with food to see if this helps with GI distress.

## 2016-02-10 NOTE — Telephone Encounter (Signed)
So what does he want to do now that he is vomiting?

## 2016-02-10 NOTE — Telephone Encounter (Signed)
Called to report that he was given doxycycline instead of Keflex she thought was going to be ordered and he was up all night vomiting. Commented that they wasted their money for it. Please advise of any changes.

## 2016-02-10 NOTE — Telephone Encounter (Signed)
I returned call to [name] but got VM, left message to take Doxycycline with food or to stop it and just watch the ankle due to numerous antibiotic allergies

## 2016-02-10 NOTE — Telephone Encounter (Signed)
Dr. Grayland Ormond did discuss Keflex with patient and family but patient has allergy to Keflex listed in chart so Doxycycline was given instead per Dr. Grayland Ormond.

## 2016-02-11 ENCOUNTER — Inpatient Hospital Stay: Payer: Medicare Other

## 2016-02-11 ENCOUNTER — Other Ambulatory Visit: Payer: Self-pay | Admitting: Oncology

## 2016-02-11 VITALS — BP 147/80 | HR 83 | Temp 96.9°F | Resp 18

## 2016-02-11 DIAGNOSIS — D509 Iron deficiency anemia, unspecified: Secondary | ICD-10-CM | POA: Diagnosis not present

## 2016-02-11 DIAGNOSIS — C155 Malignant neoplasm of lower third of esophagus: Secondary | ICD-10-CM

## 2016-02-11 DIAGNOSIS — Z5112 Encounter for antineoplastic immunotherapy: Secondary | ICD-10-CM | POA: Diagnosis not present

## 2016-02-11 DIAGNOSIS — C787 Secondary malignant neoplasm of liver and intrahepatic bile duct: Secondary | ICD-10-CM | POA: Diagnosis not present

## 2016-02-11 DIAGNOSIS — Z5111 Encounter for antineoplastic chemotherapy: Secondary | ICD-10-CM | POA: Diagnosis not present

## 2016-02-11 DIAGNOSIS — Z7689 Persons encountering health services in other specified circumstances: Secondary | ICD-10-CM | POA: Diagnosis not present

## 2016-02-11 MED ORDER — HEPARIN SOD (PORK) LOCK FLUSH 100 UNIT/ML IV SOLN
500.0000 [IU] | Freq: Once | INTRAVENOUS | Status: AC | PRN
  Administered 2016-02-11: 500 [IU]
  Filled 2016-02-11: qty 5

## 2016-02-11 MED ORDER — PEGFILGRASTIM INJECTION 6 MG/0.6ML ~~LOC~~
6.0000 mg | PREFILLED_SYRINGE | Freq: Once | SUBCUTANEOUS | Status: AC
  Administered 2016-02-11: 6 mg via SUBCUTANEOUS
  Filled 2016-02-11: qty 0.6

## 2016-02-11 MED ORDER — SODIUM CHLORIDE 0.9% FLUSH
10.0000 mL | INTRAVENOUS | Status: DC | PRN
  Administered 2016-02-11: 10 mL
  Filled 2016-02-11: qty 10

## 2016-02-18 ENCOUNTER — Telehealth: Payer: Self-pay | Admitting: *Deleted

## 2016-02-18 NOTE — Telephone Encounter (Signed)
Call returned to patients wife regarding lab results. Per Dr. Grayland Ormond will recheck labs at next visit, not concerned with elevation in single lab check. If labs continue to trend up Dr. Grayland Ormond will discuss changing treatment. Patients wife verbalized understanding.

## 2016-02-18 NOTE — Telephone Encounter (Signed)
-----   Message from Cephus Richer sent at 02/18/2016  2:13 PM EST ----- Contact: [phone] Per pt  wants to know what has happen. Pt labs were going down now they are going up up up  all of a sudden . What changed to make this happen. Did we switch anything or change something that would cause his numbers to go up?

## 2016-02-28 ENCOUNTER — Ambulatory Visit
Admission: RE | Admit: 2016-02-28 | Discharge: 2016-02-28 | Disposition: A | Discharge status: Home / Self Care | Payer: Medicare Other | Source: Ambulatory Visit | Attending: Oncology | Admitting: Oncology

## 2016-02-28 DIAGNOSIS — Z0189 Encounter for other specified special examinations: Secondary | ICD-10-CM | POA: Diagnosis not present

## 2016-02-28 DIAGNOSIS — C155 Malignant neoplasm of lower third of esophagus: Secondary | ICD-10-CM | POA: Diagnosis not present

## 2016-02-28 DIAGNOSIS — C159 Malignant neoplasm of esophagus, unspecified: Secondary | ICD-10-CM | POA: Diagnosis not present

## 2016-02-28 MED ORDER — TECHNETIUM TC 99M-LABELED RED BLOOD CELLS IV KIT
20.0000 | PACK | Freq: Once | INTRAVENOUS | Status: AC | PRN
  Administered 2016-02-28: 21.92 via INTRAVENOUS

## 2016-02-28 NOTE — Progress Notes (Deleted)
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  INTERVAL HISTORY:  Patient returns to clinic today for further evaluation and discussion of his imaging the results and consideration of cycle 7 of FOLFOX plus Herceptin.  His appetite has improved slightly and his weight has stabilized. He continues to have weakness and fatigue. He has a painful ulcer on his right ankle. He reports an improved appetite and swallowing. He has no neurologic complaints. He denies any fevers. He denies any chest pain or shortness of breath. He denies any nausea, vomiting, constipation, or diarrhea. He denies any melena or hematochezia. Patient offers no further specific complaints.  REVIEW OF SYSTEMS:   Review of Systems  Constitutional: Positive for malaise/fatigue. Negative for fever and weight loss.  Respiratory: Negative.  Negative for cough and shortness of breath.   Cardiovascular: Negative.  Negative for chest pain.  Gastrointestinal: Negative for abdominal pain, blood in stool, melena and vomiting.  Genitourinary: Negative.   Musculoskeletal: Negative.   Skin: Negative.   Neurological: Positive for weakness.  Psychiatric/Behavioral: Negative.  The patient is not nervous/anxious.     As per HPI. Otherwise, a complete review of systems is negative.  PAST MEDICAL HISTORY: Past Medical History:  Diagnosis Date  . Anemia   . Arthritis   . 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 Diabetes mellitus without complication ([place])    . Ectatic abdominal aorta ([place]) 08/12/2015  . Esophageal cancer ([place])   . GI bleed   . Hearing loss    left ear  . Hypertension   . Hypothyroidism   . Liver mass   . Mild left atrial enlargement 08/12/2015   Very mild; LA 4.2 cm; echo March 2017  . Mitral valvular regurgitation 08/12/2015  . Sleep apnea   . Stroke ([place])   . TIA (transient ischemic attack)   . 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 APPENDECTOMY    . EUS N/A 10/07/2015   Procedure: FULL UPPER ENDOSCOPIC ULTRASOUND (EUS) RADIAL;  Surgeon: Holly Bodily, MD;  Location: [facility] ENDOSCOPY;  Service: Endoscopy;  Laterality: N/A;  . EYE SURGERY    . FRACTURE SURGERY    . NOSE SURGERY    . PERIPHERAL VASCULAR CATHETERIZATION N/A 10/26/2015   Procedure: Glori Luis Cath Insertion;  Surgeon: Katha Cabal, MD;  Location: [facility];  Service: Cardiovascular;  Laterality: N/A;    FAMILY HISTORY Family History  Problem Relation Age of Onset  . Hypertension Mother   . Lung cancer Father   . Hypertension Brother        ADVANCED DIRECTIVES:    HEALTH MAINTENANCE: Social History  Substance Use Topics  . Smoking status: Former Smoker    Packs/day: 1.00    Types: Cigarettes    Quit date: 10/03/1983  . Smokeless tobacco: Never Used  . Alcohol use 0.0 oz/week     Comment: rarely     Colonoscopy:  PAP:  Bone density:  Lipid panel:  Allergies  Allergen  Reactions  . Sulfa Antibiotics Itching and Hives  . Amoxicillin Hives  . Ampicillin Hives  . Keflex [Cephalexin] Itching  . Lisinopril Other (See Comments)  . Losartan Other (See Comments)  . Sulfamethoxazole-Trimethoprim Other (See Comments)  . Cephalosporins Hives    Current Outpatient Prescriptions  Medication Sig Dispense Refill  . aspirin EC 81 MG tablet Take 81 mg by mouth. Two tablets (162 mg) two times daily    . Cholecalciferol (VITAMIN  D3) 1000 UNITS CAPS Take 2,000 Units by mouth daily.     . Coenzyme Q10 (CO Q-10) 200 MG CAPS Take 200 mg by mouth daily.     Marland Kitchen doxycycline (VIBRA-TABS) 100 MG tablet Take 1 tablet (100 mg total) by mouth 2 (two) times daily. 14 tablet 0  . isosorbide mononitrate (IMDUR) 60 MG 24 hr tablet Take 1 tablet (60 mg total) by mouth daily. 30 tablet 0  . levothyroxine (SYNTHROID, LEVOTHROID) 100 MCG tablet Take 100 mcg by mouth every morning.    . lidocaine-prilocaine (EMLA) cream Apply 1 application topically as needed. Apply to port 1-2 hours prior to chemotherapy. Cover with plastic wrap. 30 g 2  . Lutein 40 MG CAPS Take 40 mg by mouth daily.     . Magnesium Oxide 250 MG TABS Take 1 tablet (250 mg total) by mouth daily.  0  . metFORMIN (GLUCOPHAGE) 1000 MG tablet Take 2,000 mg by mouth daily.    . metoprolol tartrate (LOPRESSOR) 25 MG tablet Take 12.5 mg by mouth 2 (two) times daily.     . Misc Natural Products (BEE PROPOLIS PO) Take 1 tablet by mouth 2 (two) times daily.     . Multiple Vitamins-Minerals (MULTI FOR HIM 50+ PO) Take 1 tablet by mouth daily.    . ondansetron (ZOFRAN) 8 MG tablet Take 1 tablet (8 mg total) by mouth every 8 (eight) hours as needed for nausea or vomiting. 30 tablet 2  . prochlorperazine (COMPAZINE) 10 MG tablet Take 1 tablet (10 mg total) by mouth every 6 (six) hours as needed for nausea or vomiting. 30 tablet 2  . Red Yeast Rice 600 MG TABS Take 2 tablets by mouth at bedtime.    . sucralfate (CARAFATE) 1 g tablet Take 1 tablet (1 g total) by mouth 3 (three) times daily. Dissolve each tablet in 2-3 tbsp warm water, swish and swallow. 90 tablet 2   No current facility-administered medications for this visit.    Facility-Administered Medications Ordered in Other Visits  Medication Dose Route Frequency Provider Last Rate Last Dose  . 0.9 %  sodium chloride infusion   Intravenous Continuous Lloyd Huger, MD        OBJECTIVE: There were no vitals filed for this  visit.   There is no height or weight on file to calculate BMI.    ECOG FS:1 - Symptomatic but completely ambulatory  General: Well-developed, well-nourished, no acute distress. Eyes: Pink conjunctiva, anicteric sclera. Lungs: Clear to auscultation bilaterally. Heart: Regular rate and rhythm. No rubs, murmurs, or gallops. Abdomen: Soft, nontender, nondistended. No organomegaly noted, normoactive bowel sounds. Musculoskeletal: No edema, cyanosis, or clubbing. Neuro: Alert, answering all questions appropriately. Cranial nerves grossly intact. Skin: No rashes or petechiae noted. Subcentimeter open ulcer and right lateral malleolus. Psych: Normal affect.   LAB RESULTS:  Lab Results  Component Value Date   NA 132 (L) 02/09/2016   K 4.0 02/09/2016   CL 97 (L) 02/09/2016   CO2 27 02/09/2016   GLUCOSE 166 (H)  02/09/2016   BUN 10 02/09/2016   CREATININE 0.61 02/09/2016   CALCIUM 9.2 02/09/2016   PROT 6.9 02/09/2016   ALBUMIN 3.8 02/09/2016   AST 31 02/09/2016   ALT 17 02/09/2016   ALKPHOS 80 02/09/2016   BILITOT 0.4 02/09/2016   GFRNONAA >60 02/09/2016   GFRAA >60 02/09/2016    Lab Results  Component Value Date   WBC 13.8 (H) 02/09/2016   NEUTROABS 12.0 (H) 02/09/2016   HGB 10.0 (L) 02/09/2016   HCT 30.0 (L) 02/09/2016   MCV 87.7 02/09/2016   PLT 364 02/09/2016   Lab Results  Component Value Date   IRON 31 (L) 12/22/2015   TIBC 400 12/22/2015   IRONPCTSAT 8 (L) 12/22/2015    Lab Results  Component Value Date   FERRITIN 32 12/22/2015   Lab Results  Component Value Date   CA199 6,528 (H) 02/09/2016   Lab Results  Component Value Date   CEA 96.6 (H) 02/09/2016     STUDIES: Nm Cardiac Muga Rest  Result Date: 02/28/2016 CLINICAL DATA:  Pre chemotherapy evaluation. EXAM: NUCLEAR MEDICINE CARDIAC BLOOD POOL IMAGING (MUGA) TECHNIQUE: Cardiac multi-gated acquisition was performed at rest following intravenous injection of Tc-20mlabeled red blood cells.  RADIOPHARMACEUTICALS:  21.9 mCi Tc-936mDP in-vitro labeled red blood cells IV COMPARISON:  11/17/2015 FINDINGS: The left ventricular ejection fraction is equal to 52.8%. On the previous exam the left ventricular ejection fraction was equal to 52%. No significant wall motion abnormality identified. IMPRESSION: 1. Stable left ventricular ejection fraction equal to 52.8%. Electronically Signed   By: TaKerby Moors.D.   On: 02/28/2016 14:46   Nm Pet Image Restag (ps) Skull Base To Thigh  Result Date: 02/02/2016 CLINICAL DATA:  Subsequent treatment strategy for esophageal cancer. EXAM: NUCLEAR MEDICINE PET SKULL BASE TO THIGH TECHNIQUE: 13.0 mCi F-18 FDG was injected intravenously. Full-ring PET imaging was performed from the skull base to thigh after the radiotracer. CT data was obtained and used for attenuation correction and anatomic localization. FASTING BLOOD GLUCOSE:  Value: 75 mg/dl COMPARISON:  10/11/2015 FINDINGS: NECK No hypermetabolic lymph nodes in the neck. Chronic left maxillary sinusitis. Right mastoid effusion. Left posterior cerebral artery distribution encephalomalacia with associated hypo activity. CHEST Right paratracheal node 1.4 cm in short axis, maximum SUV 3.1, formerly 5.5. Abnormal activity in the subcarinal lymph node appears to have resolved as has the paraesophageal adenopathy activity. Activity in the distal esophagus currently has a maximum SUV of 5.2, formerly 17.1 the Prior pulmonary nodules appear reduced in size and number compared prior exam. Index peripheral right upper lobe nodule 0.6 cm by 0.5 cm on image 84/3 without appreciable hypermetabolic activity, previously 0.7 by 0.6 cm and with a prior maximum standard uptake value of 2.1. The prior left infrahilar nodule which previously measured 9 mm in diameter currently measures about 5 mm and is no longer hypermetabolic, previous maximum standard uptake value 7.1. ABDOMEN/PELVIS Capsular focus of metabolic activity along the  anterior right hepatic lobe margin without CT correlate, maximum SUV 4.1, previously 5.5 Gastrohepatic ligament nodal mass with scattered calcifications similar in size today at 6.1 by 4.3 cm, maximum SUV 4.4, previously 16.4. Left adrenal gland nodule maximum SUV 3.7, previously 8.3 Physiologic activity in the bowel. A no hypermetabolic liver intraparenchymal lesions are identified; at the site of the prior right hepatic lobe lesion with maximum SUV of 9.7 no hypermetabolic lesion is currently visible. Physiologic activity is present throughout much of the bowel. There are right anterior omental  nodules including a 1.2 by 1.7 cm omental nodule on image 188/3 with maximum SUV of 4.2, generally similar to the prior exam where there was a 1.7 by 1.9 cm omental nodule with similar SUV. Some of the small omental nodules adjacent to the gallbladder fossa are slightly larger than before. SKELETON Hypermetabolic focus along the right hip adductor musculature no demonstrates irregular rim calcification and has maximum SUV of 6.0, formerly 7.8 Former hypermetabolic focus in the right scapula is no longer hypermetabolic. Former small hypermetabolic focus along the right side of the cervicothoracic junction is no longer hypermetabolic. Small hypermetabolic focus in the left inguinal canal is no longer hypermetabolic. IMPRESSION: 1. Considerable response to therapy with marked reduction in activity in the primary mass, resolution of some of the thoracic adenopathy with reduction in activity in the other thoracic adenopathy, resolution of the hepatic parenchymal metastatic activity, and prominent reduction in the gastrohepatic ligament mass metabolic activity. Reduction in size, number, and hypermetabolic activity of bilateral pulmonary nodules. 2. There several right anterior omental nodules of similar activity to the prior exam compatible with omental metastatic disease. 3. Hypermetabolic focus along the right hip adductor  musculature demonstrates some evolutionary changes with peripheral calcifications, and has mildly reduced in standard uptake value compared to the prior exam. This could represent a metastatic focus or conceivably a traumatic lesion. 4. The former hypermetabolic focus in the right scapular tip is no longer visible. Similarly small bony foci of activity along the cervicothoracic junction and left inguinal canal are no longer apparent. 5. Chronic left maxillary sinusitis and right mastoid effusion. 6. Coronary, aortic arch, and branch vessel atherosclerotic vascular disease. Aortoiliac atherosclerotic vascular disease. Electronically Signed   By: Van Clines M.D.   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  PLAN:    1. Stage IV adenocarcinoma of the lower third esophagus, HER-2 positive, with liver metastasis: PET scan results from February 02, 2016 reviewed independently and reported as above with significant improvement of disease burden. Previously, biopsy confirmed adenocarcinoma consistent with esophageal origin. MUGA scan on November 22, 2015 reported an EF of 52%.  CA-19-9 and CEA are trending down. Proceed with cycle 7 of FOLFOX plus Herceptin today.  Patient has now completed his XRT. Return to clinic in 3 weeks for consideration of cycle 8. Patient will also require MUGA in the near future.  2. Iron deficiency anemia: Consider IV iron in the future. 3. Recent MI: Treatment per cardiology. MUGA as above. 4. Weight loss: Improving. Likely secondary to underlying malignancy, monitor. 5. Dysphagia: XRT has been completed. 6. Neutropenia: Continue Neulasta with the remainder of his cycles. 7. Weakness and fatigue: Multifactorial. Monitor. 8. Ankle ulcer: Patient was given a prescription for doxycycline and may require a wound care referral and the future.  Patient expressed understanding and was in  agreement with this plan. He also understands that He can call clinic at any time with any questions, concerns, or complaints.    Lloyd Huger, MD   02/28/2016 9:43 PM

## 2016-03-01 ENCOUNTER — Inpatient Hospital Stay: Payer: Medicare Other | Admitting: Oncology

## 2016-03-01 ENCOUNTER — Inpatient Hospital Stay: Payer: Medicare Other

## 2016-03-03 ENCOUNTER — Inpatient Hospital Stay: Payer: Medicare Other | Attending: Oncology

## 2016-03-03 DIAGNOSIS — Z8673 Personal history of transient ischemic attack (TIA), and cerebral infarction without residual deficits: Secondary | ICD-10-CM | POA: Insufficient documentation

## 2016-03-03 DIAGNOSIS — E039 Hypothyroidism, unspecified: Secondary | ICD-10-CM | POA: Insufficient documentation

## 2016-03-03 DIAGNOSIS — Z7984 Long term (current) use of oral hypoglycemic drugs: Secondary | ICD-10-CM | POA: Insufficient documentation

## 2016-03-03 DIAGNOSIS — Z5111 Encounter for antineoplastic chemotherapy: Secondary | ICD-10-CM | POA: Insufficient documentation

## 2016-03-03 DIAGNOSIS — Z5112 Encounter for antineoplastic immunotherapy: Secondary | ICD-10-CM | POA: Insufficient documentation

## 2016-03-03 DIAGNOSIS — Z87891 Personal history of nicotine dependence: Secondary | ICD-10-CM | POA: Insufficient documentation

## 2016-03-03 DIAGNOSIS — M129 Arthropathy, unspecified: Secondary | ICD-10-CM | POA: Insufficient documentation

## 2016-03-03 DIAGNOSIS — C787 Secondary malignant neoplasm of liver and intrahepatic bile duct: Secondary | ICD-10-CM | POA: Insufficient documentation

## 2016-03-03 DIAGNOSIS — R531 Weakness: Secondary | ICD-10-CM | POA: Insufficient documentation

## 2016-03-03 DIAGNOSIS — G4730 Sleep apnea, unspecified: Secondary | ICD-10-CM | POA: Insufficient documentation

## 2016-03-03 DIAGNOSIS — I77811 Abdominal aortic ectasia: Secondary | ICD-10-CM | POA: Insufficient documentation

## 2016-03-03 DIAGNOSIS — I119 Hypertensive heart disease without heart failure: Secondary | ICD-10-CM | POA: Insufficient documentation

## 2016-03-03 DIAGNOSIS — E119 Type 2 diabetes mellitus without complications: Secondary | ICD-10-CM | POA: Insufficient documentation

## 2016-03-03 DIAGNOSIS — I517 Cardiomegaly: Secondary | ICD-10-CM | POA: Insufficient documentation

## 2016-03-03 DIAGNOSIS — D709 Neutropenia, unspecified: Secondary | ICD-10-CM | POA: Insufficient documentation

## 2016-03-03 DIAGNOSIS — L97319 Non-pressure chronic ulcer of right ankle with unspecified severity: Secondary | ICD-10-CM | POA: Insufficient documentation

## 2016-03-03 DIAGNOSIS — R1310 Dysphagia, unspecified: Secondary | ICD-10-CM | POA: Insufficient documentation

## 2016-03-03 DIAGNOSIS — C155 Malignant neoplasm of lower third of esophagus: Secondary | ICD-10-CM | POA: Insufficient documentation

## 2016-03-03 DIAGNOSIS — I10 Essential (primary) hypertension: Secondary | ICD-10-CM | POA: Insufficient documentation

## 2016-03-03 DIAGNOSIS — Z7982 Long term (current) use of aspirin: Secondary | ICD-10-CM | POA: Insufficient documentation

## 2016-03-03 DIAGNOSIS — Z800 Family history of malignant neoplasm of digestive organs: Secondary | ICD-10-CM | POA: Insufficient documentation

## 2016-03-03 DIAGNOSIS — Z7689 Persons encountering health services in other specified circumstances: Secondary | ICD-10-CM | POA: Insufficient documentation

## 2016-03-03 DIAGNOSIS — R634 Abnormal weight loss: Secondary | ICD-10-CM | POA: Insufficient documentation

## 2016-03-03 DIAGNOSIS — Z79899 Other long term (current) drug therapy: Secondary | ICD-10-CM | POA: Insufficient documentation

## 2016-03-03 DIAGNOSIS — Z923 Personal history of irradiation: Secondary | ICD-10-CM | POA: Insufficient documentation

## 2016-03-05 ENCOUNTER — Other Ambulatory Visit: Payer: Self-pay | Admitting: Oncology

## 2016-03-05 NOTE — Progress Notes (Signed)
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  INTERVAL HISTORY:  Patient returns to clinic today for further evaluation and consideration of cycle 8 of FOLFOX plus Herceptin. His appetite has improved significantly and he is now gaining weight. He does not complain of weakness or fatigue today. The painful ulcer on his right ankle is unchanged. He reports improved swallowing. He has no neurologic complaints. He denies any fevers. He denies any chest pain or shortness of breath. He denies any nausea, vomiting, constipation, or diarrhea. He denies any melena or hematochezia. Patient offers no further specific complaints.  REVIEW OF SYSTEMS:   Review of Systems  Constitutional: Negative for fever, malaise/fatigue and weight loss.  Respiratory: Negative.  Negative for cough and shortness of breath.   Cardiovascular: Negative.  Negative for chest pain.  Gastrointestinal: Negative for abdominal pain, blood in stool, melena and vomiting.  Genitourinary: Negative.   Musculoskeletal: Negative.   Skin: Negative.   Neurological: Negative for weakness.  Psychiatric/Behavioral: Negative.  The patient is not nervous/anxious.     As per HPI. Otherwise, a complete review of systems is negative.  PAST MEDICAL HISTORY: Past Medical History:  Diagnosis Date  . Anemia   . Arthritis   . 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 Diabetes mellitus without complication ([place])   . Ectatic abdominal aorta ([place]) 08/12/2015  .  Esophageal cancer ([place])   . GI bleed   . Hearing loss    left ear  . Hypertension   . Hypothyroidism   . Liver mass   . Mild left atrial enlargement 08/12/2015   Very mild; LA 4.2 cm; echo March 2017  . Mitral valvular regurgitation 08/12/2015  . Sleep apnea   . Stroke ([place])   . TIA (transient ischemic attack)   . 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 APPENDECTOMY    . EUS N/A 10/07/2015   Procedure: FULL UPPER ENDOSCOPIC ULTRASOUND (EUS) RADIAL;  Surgeon: Holly Bodily, MD;  Location: [facility]OS[facility];  Service: Endoscopy;  Laterality: N/A;  . EYE SURGERY    . FRACTURE SURGERY    . NOSE SURGERY    . PERIPHERAL VASCULAR CATHETERIZATION N/A 10/26/2015   Procedure: Glori Luis Cath Insertion;  Surgeon: Katha Cabal, MD;  Location: [facility];  Service: Cardiovascular;  Laterality: N/A;    FAMILY HISTORY Family History  Problem Relation Age of Onset  . Hypertension Mother   . Lung cancer Father   . Hypertension Brother        ADVANCED DIRECTIVES:    HEALTH MAINTENANCE: Social History  Substance Use Topics  . Smoking status: Former Smoker    Packs/day: 1.00    Types: Cigarettes    Quit date: 10/03/1983  . Smokeless tobacco: Never Used  . Alcohol use 0.0 oz/week     Comment: rarely     Colonoscopy:  PAP:  Bone density:  Lipid panel:  Allergies  Allergen Reactions  . Sulfa Antibiotics Itching and Hives  .  Amoxicillin Hives  . Ampicillin Hives  . Keflex [Cephalexin] Itching  . Lisinopril Other (See Comments)  . Losartan Other (See Comments)  . Sulfamethoxazole-Trimethoprim Other (See Comments)  . Cephalosporins Hives    Current Outpatient Prescriptions  Medication Sig Dispense Refill  . aspirin EC 81 MG tablet Take 81 mg by mouth. Two tablets (162 mg) two times daily    . Cholecalciferol (VITAMIN D3) 1000 UNITS CAPS Take 2,000 Units by mouth  daily.     . Coenzyme Q10 (CO Q-10) 200 MG CAPS Take 200 mg by mouth daily.     Marland Kitchen doxycycline (VIBRA-TABS) 100 MG tablet Take 1 tablet (100 mg total) by mouth 2 (two) times daily. 14 tablet 0  . isosorbide mononitrate (IMDUR) 60 MG 24 hr tablet Take 1 tablet (60 mg total) by mouth daily. 30 tablet 0  . levothyroxine (SYNTHROID, LEVOTHROID) 100 MCG tablet Take 100 mcg by mouth every morning.    . lidocaine-prilocaine (EMLA) cream Apply 1 application topically as needed. Apply to port 1-2 hours prior to chemotherapy. Cover with plastic wrap. 30 g 2  . Lutein 40 MG CAPS Take 40 mg by mouth daily.     . Magnesium Oxide 250 MG TABS Take 1 tablet (250 mg total) by mouth daily.  0  . metFORMIN (GLUCOPHAGE) 1000 MG tablet Take 2,000 mg by mouth daily.    . metoprolol tartrate (LOPRESSOR) 25 MG tablet Take 12.5 mg by mouth 2 (two) times daily.     . Misc Natural Products (BEE PROPOLIS PO) Take 1 tablet by mouth 2 (two) times daily.     . Multiple Vitamins-Minerals (MULTI FOR HIM 50+ PO) Take 1 tablet by mouth daily.    . ondansetron (ZOFRAN) 8 MG tablet Take 1 tablet (8 mg total) by mouth every 8 (eight) hours as needed for nausea or vomiting. 30 tablet 2  . prochlorperazine (COMPAZINE) 10 MG tablet Take 1 tablet (10 mg total) by mouth every 6 (six) hours as needed for nausea or vomiting. 30 tablet 2  . Red Yeast Rice 600 MG TABS Take 2 tablets by mouth at bedtime.    . sucralfate (CARAFATE) 1 g tablet Take 1 tablet (1 g total) by mouth 3 (three) times daily. Dissolve each tablet in 2-3 tbsp warm water, swish and swallow. 90 tablet 2   No current facility-administered medications for this visit.    Facility-Administered Medications Ordered in Other Visits  Medication Dose Route Frequency Provider Last Rate Last Dose  . 0.9 %  sodium chloride infusion   Intravenous Continuous Lloyd Huger, MD      . dextrose 5 % solution   Intravenous Once Lloyd Huger, MD      . fluorouracil (ADRUCIL) 4,200  mg in sodium chloride 0.9 % 66 mL chemo infusion  2,400 mg/m2 (Treatment Plan Recorded) Intravenous 1 day or 1 dose Lloyd Huger, MD      . fluorouracil (ADRUCIL) chemo injection 700 mg  400 mg/m2 (Treatment Plan Recorded) Intravenous Once Lloyd Huger, MD      . heparin lock flush 100 unit/mL  500 Units Intracatheter Once PRN Lloyd Huger, MD      . leucovorin 700 mg in dextrose 5 % 250 mL infusion  700 mg Intravenous Once Lloyd Huger, MD      . oxaliplatin (ELOXATIN) 150 mg in dextrose 5 % 500 mL chemo infusion  85 mg/m2 (Treatment Plan Recorded) Intravenous Once Lloyd Huger, MD      .  sodium chloride flush (NS) 0.9 % injection 10 mL  10 mL Intracatheter PRN Lloyd Huger, MD   10 mL at 03/06/16 0944  . trastuzumab (HERCEPTIN) 231 mg in sodium chloride 0.9 % 250 mL chemo infusion  231 mg Intravenous Once Lloyd Huger, MD        OBJECTIVE: Vitals:   03/06/16 1001  BP: (!) 134/58  Pulse: 60  Temp: 97.3 F (36.3 C)     Body mass index is 20.16 kg/m.    ECOG FS:1 - Symptomatic but completely ambulatory  General: Well-developed, well-nourished, no acute distress. Eyes: Pink conjunctiva, anicteric sclera. Lungs: Clear to auscultation bilaterally. Heart: Regular rate and rhythm. No rubs, murmurs, or gallops. Abdomen: Soft, nontender, nondistended. No organomegaly noted, normoactive bowel sounds. Musculoskeletal: No edema, cyanosis, or clubbing. Neuro: Alert, answering all questions appropriately. Cranial nerves grossly intact. Skin: No rashes or petechiae noted. Subcentimeter open ulcer and right lateral malleolus. Psych: Normal affect.   LAB RESULTS:  Lab Results  Component Value Date   NA 132 (L) 03/06/2016   K 4.0 03/06/2016   CL 97 (L) 03/06/2016   CO2 27 03/06/2016   GLUCOSE 177 (H) 03/06/2016   BUN 11 03/06/2016   CREATININE 0.52 (L) 03/06/2016   CALCIUM 9.1 03/06/2016   PROT 6.8 03/06/2016   ALBUMIN 3.7 03/06/2016   AST 34  03/06/2016   ALT 17 03/06/2016   ALKPHOS 70 03/06/2016   BILITOT 0.3 03/06/2016   GFRNONAA >60 03/06/2016   GFRAA >60 03/06/2016    Lab Results  Component Value Date   WBC 13.2 (H) 03/06/2016   NEUTROABS 11.4 (H) 03/06/2016   HGB 9.9 (L) 03/06/2016   HCT 29.7 (L) 03/06/2016   MCV 91.8 03/06/2016   PLT 328 03/06/2016   Lab Results  Component Value Date   IRON 31 (L) 12/22/2015   TIBC 400 12/22/2015   IRONPCTSAT 8 (L) 12/22/2015    Lab Results  Component Value Date   FERRITIN 32 12/22/2015   Lab Results  Component Value Date   CA199 6,528 (H) 02/09/2016   Lab Results  Component Value Date   CEA 96.6 (H) 02/09/2016     STUDIES: Nm Cardiac Muga Rest  Result Date: 02/28/2016 CLINICAL DATA:  Pre chemotherapy evaluation. EXAM: NUCLEAR MEDICINE CARDIAC BLOOD POOL IMAGING (MUGA) TECHNIQUE: Cardiac multi-gated acquisition was performed at rest following intravenous injection of Tc-22mlabeled red blood cells. RADIOPHARMACEUTICALS:  21.9 mCi Tc-928mDP in-vitro labeled red blood cells IV COMPARISON:  11/17/2015 FINDINGS: The left ventricular ejection fraction is equal to 52.8%. On the previous exam the left ventricular ejection fraction was equal to 52%. No significant wall motion abnormality identified. IMPRESSION: 1. Stable left ventricular ejection fraction equal to 52.8%. Electronically Signed   By: TaKerby Moors.D.   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  PLAN:    1. 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 Previously, biopsy confirmed adenocarcinoma consistent with esophageal origin. MUGA scan on February 28, 2016 reported an EF of 52%, which is unchanged from previous.  CA-19-9 and CEA are trending down. Proceed  with cycle 8 of FOLFOX plus Herceptin today.  Patient has now completed his XRT. Return to clinic in 3 weeks for consideration of cycle 9.  2. Iron deficiency anemia: Consider  IV iron in the future. 3. Recent MI: Treatment per cardiology. MUGA as above. 4. Weight loss: Improving. Likely secondary to underlying malignancy, monitor. 5. Dysphagia: XRT has been completed. 6. Neutropenia: Continue Neulasta with the remainder of his cycles. 7. Weakness and fatigue: Improving. 8. Ankle ulcer: Nonhealing, a referral was given for wound care clinic for further evaluation.   Patient expressed understanding and was in agreement with this plan. He also understands that He can call clinic at any time with any questions, concerns, or complaints.    Lloyd Huger, MD   03/06/2016 11:10 AM

## 2016-03-06 ENCOUNTER — Inpatient Hospital Stay: Payer: Medicare Other

## 2016-03-06 ENCOUNTER — Inpatient Hospital Stay (HOSPITAL_BASED_OUTPATIENT_CLINIC_OR_DEPARTMENT_OTHER): Payer: Medicare Other | Admitting: Oncology

## 2016-03-06 ENCOUNTER — Encounter: Payer: Self-pay | Admitting: Oncology

## 2016-03-06 VITALS — BP 134/58 | HR 60 | Temp 97.3°F | Wt 128.7 lb

## 2016-03-06 DIAGNOSIS — I10 Essential (primary) hypertension: Secondary | ICD-10-CM

## 2016-03-06 DIAGNOSIS — Z87891 Personal history of nicotine dependence: Secondary | ICD-10-CM

## 2016-03-06 DIAGNOSIS — L97319 Non-pressure chronic ulcer of right ankle with unspecified severity: Secondary | ICD-10-CM

## 2016-03-06 DIAGNOSIS — Z923 Personal history of irradiation: Secondary | ICD-10-CM

## 2016-03-06 DIAGNOSIS — Z7982 Long term (current) use of aspirin: Secondary | ICD-10-CM

## 2016-03-06 DIAGNOSIS — E119 Type 2 diabetes mellitus without complications: Secondary | ICD-10-CM

## 2016-03-06 DIAGNOSIS — R1310 Dysphagia, unspecified: Secondary | ICD-10-CM

## 2016-03-06 DIAGNOSIS — I517 Cardiomegaly: Secondary | ICD-10-CM

## 2016-03-06 DIAGNOSIS — R634 Abnormal weight loss: Secondary | ICD-10-CM | POA: Diagnosis not present

## 2016-03-06 DIAGNOSIS — Z79899 Other long term (current) drug therapy: Secondary | ICD-10-CM | POA: Diagnosis not present

## 2016-03-06 DIAGNOSIS — C155 Malignant neoplasm of lower third of esophagus: Secondary | ICD-10-CM

## 2016-03-06 DIAGNOSIS — M129 Arthropathy, unspecified: Secondary | ICD-10-CM | POA: Diagnosis not present

## 2016-03-06 DIAGNOSIS — Z5112 Encounter for antineoplastic immunotherapy: Secondary | ICD-10-CM | POA: Diagnosis not present

## 2016-03-06 DIAGNOSIS — D709 Neutropenia, unspecified: Secondary | ICD-10-CM

## 2016-03-06 DIAGNOSIS — S91001A Unspecified open wound, right ankle, initial encounter: Secondary | ICD-10-CM

## 2016-03-06 DIAGNOSIS — I77811 Abdominal aortic ectasia: Secondary | ICD-10-CM

## 2016-03-06 DIAGNOSIS — Z7689 Persons encountering health services in other specified circumstances: Secondary | ICD-10-CM | POA: Diagnosis not present

## 2016-03-06 DIAGNOSIS — C787 Secondary malignant neoplasm of liver and intrahepatic bile duct: Secondary | ICD-10-CM

## 2016-03-06 DIAGNOSIS — G4730 Sleep apnea, unspecified: Secondary | ICD-10-CM

## 2016-03-06 DIAGNOSIS — Z5111 Encounter for antineoplastic chemotherapy: Secondary | ICD-10-CM | POA: Diagnosis not present

## 2016-03-06 DIAGNOSIS — E039 Hypothyroidism, unspecified: Secondary | ICD-10-CM

## 2016-03-06 DIAGNOSIS — R531 Weakness: Secondary | ICD-10-CM | POA: Diagnosis not present

## 2016-03-06 DIAGNOSIS — Z800 Family history of malignant neoplasm of digestive organs: Secondary | ICD-10-CM

## 2016-03-06 DIAGNOSIS — Z7984 Long term (current) use of oral hypoglycemic drugs: Secondary | ICD-10-CM

## 2016-03-06 DIAGNOSIS — Z8673 Personal history of transient ischemic attack (TIA), and cerebral infarction without residual deficits: Secondary | ICD-10-CM

## 2016-03-06 DIAGNOSIS — I119 Hypertensive heart disease without heart failure: Secondary | ICD-10-CM

## 2016-03-06 LAB — CBC WITH DIFFERENTIAL/PLATELET
BASOS ABS: 0.1 10*3/uL (ref 0–0.1)
BASOS PCT: 1 %
Eosinophils Absolute: 0 10*3/uL (ref 0–0.7)
Eosinophils Relative: 0 %
HCT: 29.7 % — ABNORMAL LOW (ref 40.0–52.0)
HEMOGLOBIN: 9.9 g/dL — AB (ref 13.0–18.0)
Lymphocytes Relative: 3 %
Lymphs Abs: 0.4 10*3/uL — ABNORMAL LOW (ref 1.0–3.6)
MCH: 30.6 pg (ref 26.0–34.0)
MCHC: 33.4 g/dL (ref 32.0–36.0)
MCV: 91.8 fL (ref 80.0–100.0)
MONOS PCT: 10 %
Monocytes Absolute: 1.3 10*3/uL — ABNORMAL HIGH (ref 0.2–1.0)
NEUTROS ABS: 11.4 10*3/uL — AB (ref 1.4–6.5)
NEUTROS PCT: 86 %
Platelets: 328 10*3/uL (ref 150–440)
RBC: 3.23 MIL/uL — AB (ref 4.40–5.90)
RDW: 21.6 % — ABNORMAL HIGH (ref 11.5–14.5)
WBC: 13.2 10*3/uL — AB (ref 3.8–10.6)

## 2016-03-06 LAB — COMPREHENSIVE METABOLIC PANEL
ALK PHOS: 70 U/L (ref 38–126)
ALT: 17 U/L (ref 17–63)
ANION GAP: 8 (ref 5–15)
AST: 34 U/L (ref 15–41)
Albumin: 3.7 g/dL (ref 3.5–5.0)
BILIRUBIN TOTAL: 0.3 mg/dL (ref 0.3–1.2)
BUN: 11 mg/dL (ref 6–20)
CALCIUM: 9.1 mg/dL (ref 8.9–10.3)
CO2: 27 mmol/L (ref 22–32)
CREATININE: 0.52 mg/dL — AB (ref 0.61–1.24)
Chloride: 97 mmol/L — ABNORMAL LOW (ref 101–111)
GFR calc non Af Amer: >60 mL/min (ref >60)
Glucose, Bld: 177 mg/dL — ABNORMAL HIGH (ref 65–99)
Potassium: 4 mmol/L (ref 3.5–5.1)
Sodium: 132 mmol/L — ABNORMAL LOW (ref 135–145)
TOTAL PROTEIN: 6.8 g/dL (ref 6.5–8.1)

## 2016-03-06 MED ORDER — ACETAMINOPHEN 325 MG PO TABS
650.0000 mg | ORAL_TABLET | Freq: Once | ORAL | Status: AC
  Administered 2016-03-06: 650 mg via ORAL
  Filled 2016-03-06: qty 2

## 2016-03-06 MED ORDER — SODIUM CHLORIDE 0.9 % IV SOLN: 10.0000 mg | Freq: Once | INTRAVENOUS | Status: DC

## 2016-03-06 MED ORDER — SODIUM CHLORIDE 0.9 % IV SOLN
2400.0000 mg/m2 | INTRAVENOUS | Status: DC
  Administered 2016-03-06: 4200 mg via INTRAVENOUS
  Filled 2016-03-06: qty 84

## 2016-03-06 MED ORDER — DEXTROSE 5 % IV SOLN
Freq: Once | INTRAVENOUS | Status: AC
  Administered 2016-03-06: 12:00:00 via INTRAVENOUS
  Filled 2016-03-06: qty 1000

## 2016-03-06 MED ORDER — LEUCOVORIN CALCIUM INJECTION 350 MG
700.0000 mg | Freq: Once | INTRAVENOUS | Status: AC
  Administered 2016-03-06: 700 mg via INTRAVENOUS
  Filled 2016-03-06: qty 35

## 2016-03-06 MED ORDER — FLUOROURACIL CHEMO INJECTION 2.5 GM/50ML
400.0000 mg/m2 | Freq: Once | INTRAVENOUS | Status: AC
  Administered 2016-03-06: 700 mg via INTRAVENOUS
  Filled 2016-03-06: qty 14

## 2016-03-06 MED ORDER — SODIUM CHLORIDE 0.9% FLUSH
10.0000 mL | INTRAVENOUS | Status: DC | PRN
  Administered 2016-03-06: 10 mL
  Filled 2016-03-06: qty 10

## 2016-03-06 MED ORDER — PALONOSETRON HCL INJECTION 0.25 MG/5ML
0.2500 mg | Freq: Once | INTRAVENOUS | Status: AC
  Administered 2016-03-06: 0.25 mg via INTRAVENOUS
  Filled 2016-03-06: qty 5

## 2016-03-06 MED ORDER — DEXAMETHASONE SODIUM PHOSPHATE 10 MG/ML IJ SOLN
10.0000 mg | Freq: Once | INTRAMUSCULAR | Status: AC
Start: 2016-03-06 — End: 2016-03-06
  Administered 2016-03-06: 10 mg via INTRAVENOUS
  Filled 2016-03-06: qty 1

## 2016-03-06 MED ORDER — OXALIPLATIN CHEMO INJECTION 100 MG/20ML
85.0000 mg/m2 | Freq: Once | INTRAVENOUS | Status: AC
  Administered 2016-03-06: 150 mg via INTRAVENOUS
  Filled 2016-03-06: qty 20

## 2016-03-06 MED ORDER — DIPHENHYDRAMINE HCL 25 MG PO CAPS
25.0000 mg | ORAL_CAPSULE | Freq: Once | ORAL | Status: AC
  Administered 2016-03-06: 25 mg via ORAL
  Filled 2016-03-06: qty 1

## 2016-03-06 MED ORDER — TRASTUZUMAB CHEMO 150 MG IV SOLR
231.0000 mg | Freq: Once | INTRAVENOUS | Status: AC
  Administered 2016-03-06: 231 mg via INTRAVENOUS
  Filled 2016-03-06: qty 11

## 2016-03-06 MED ORDER — HEPARIN SOD (PORK) LOCK FLUSH 100 UNIT/ML IV SOLN: 500.0000 [IU] | Freq: Once | INTRAVENOUS | Status: DC | PRN

## 2016-03-06 MED ORDER — SODIUM CHLORIDE 0.9 % IV SOLN
Freq: Once | INTRAVENOUS | Status: AC
  Administered 2016-03-06: 11:00:00 via INTRAVENOUS
  Filled 2016-03-06: qty 1000

## 2016-03-06 NOTE — Progress Notes (Signed)
Patient ambulates without assistance, brought to exam room 9 accompanied by his wife.  Patient c/o wound on his right ankle, states Dr. Grayland Ormond is aware of the sore from his last visit.  Vitals documented

## 2016-03-07 LAB — CANCER ANTIGEN 19-9: CA 19 9: 13613 U/mL — AB (ref 0–35)

## 2016-03-07 LAB — CEA: CEA: 193.3 ng/mL — AB (ref 0.0–4.7)

## 2016-03-08 ENCOUNTER — Inpatient Hospital Stay: Payer: Medicare Other

## 2016-03-08 DIAGNOSIS — C787 Secondary malignant neoplasm of liver and intrahepatic bile duct: Secondary | ICD-10-CM | POA: Diagnosis not present

## 2016-03-08 DIAGNOSIS — Z5111 Encounter for antineoplastic chemotherapy: Secondary | ICD-10-CM | POA: Diagnosis not present

## 2016-03-08 DIAGNOSIS — C801 Malignant (primary) neoplasm, unspecified: Secondary | ICD-10-CM

## 2016-03-08 DIAGNOSIS — R634 Abnormal weight loss: Secondary | ICD-10-CM | POA: Diagnosis not present

## 2016-03-08 DIAGNOSIS — Z7689 Persons encountering health services in other specified circumstances: Secondary | ICD-10-CM | POA: Diagnosis not present

## 2016-03-08 DIAGNOSIS — Z5112 Encounter for antineoplastic immunotherapy: Secondary | ICD-10-CM | POA: Diagnosis not present

## 2016-03-08 DIAGNOSIS — C155 Malignant neoplasm of lower third of esophagus: Secondary | ICD-10-CM | POA: Diagnosis not present

## 2016-03-08 MED ORDER — HEPARIN SOD (PORK) LOCK FLUSH 100 UNIT/ML IV SOLN
500.0000 [IU] | Freq: Once | INTRAVENOUS | Status: AC
  Administered 2016-03-08: 500 [IU] via INTRAVENOUS

## 2016-03-08 MED ORDER — PEGFILGRASTIM INJECTION 6 MG/0.6ML ~~LOC~~
6.0000 mg | PREFILLED_SYRINGE | Freq: Once | SUBCUTANEOUS | Status: AC
  Administered 2016-03-08: 6 mg via SUBCUTANEOUS
  Filled 2016-03-08: qty 0.6

## 2016-03-08 MED ORDER — SODIUM CHLORIDE 0.9% FLUSH
10.0000 mL | INTRAVENOUS | Status: DC | PRN
  Administered 2016-03-08: 10 mL via INTRAVENOUS
  Filled 2016-03-08: qty 10

## 2016-03-09 ENCOUNTER — Telehealth: Payer: Self-pay | Admitting: *Deleted

## 2016-03-09 DIAGNOSIS — C787 Secondary malignant neoplasm of liver and intrahepatic bile duct: Secondary | ICD-10-CM

## 2016-03-09 DIAGNOSIS — C155 Malignant neoplasm of lower third of esophagus: Secondary | ICD-10-CM

## 2016-03-09 NOTE — Telephone Encounter (Signed)
Pt's spouse wants to know why tumor markers are increasing since pt has been doing better with treatments. Please advise.

## 2016-03-09 NOTE — Telephone Encounter (Signed)
Repeat the PET scan prior to his next clinic appointment.

## 2016-03-09 NOTE — Telephone Encounter (Signed)
Pt's wife has been notified with MD recommendations. Would like to have PET scan scheduled in the next 1-2 weeks. Scheduling message sent and the pt's wife will be notified with appt.

## 2016-03-10 ENCOUNTER — Other Ambulatory Visit: Payer: Self-pay | Admitting: Surgery

## 2016-03-10 ENCOUNTER — Ambulatory Visit
Admission: RE | Admit: 2016-03-10 | Discharge: 2016-03-10 | Disposition: A | Discharge status: Home / Self Care | Payer: Medicare Other | Source: Ambulatory Visit | Attending: Surgery | Admitting: Surgery

## 2016-03-10 ENCOUNTER — Encounter: Payer: Medicare Other | Attending: Surgery | Admitting: Surgery

## 2016-03-10 DIAGNOSIS — I10 Essential (primary) hypertension: Secondary | ICD-10-CM | POA: Insufficient documentation

## 2016-03-10 DIAGNOSIS — L89512 Pressure ulcer of right ankle, stage 2: Secondary | ICD-10-CM | POA: Diagnosis not present

## 2016-03-10 DIAGNOSIS — S81809A Unspecified open wound, unspecified lower leg, initial encounter: Secondary | ICD-10-CM | POA: Diagnosis present

## 2016-03-10 DIAGNOSIS — X58XXXA Exposure to other specified factors, initial encounter: Secondary | ICD-10-CM | POA: Diagnosis not present

## 2016-03-10 DIAGNOSIS — I739 Peripheral vascular disease, unspecified: Secondary | ICD-10-CM | POA: Diagnosis not present

## 2016-03-10 DIAGNOSIS — Z87891 Personal history of nicotine dependence: Secondary | ICD-10-CM | POA: Insufficient documentation

## 2016-03-10 DIAGNOSIS — C787 Secondary malignant neoplasm of liver and intrahepatic bile duct: Secondary | ICD-10-CM | POA: Diagnosis not present

## 2016-03-10 DIAGNOSIS — S81801A Unspecified open wound, right lower leg, initial encounter: Secondary | ICD-10-CM

## 2016-03-10 DIAGNOSIS — E11621 Type 2 diabetes mellitus with foot ulcer: Secondary | ICD-10-CM | POA: Diagnosis not present

## 2016-03-10 DIAGNOSIS — C155 Malignant neoplasm of lower third of esophagus: Secondary | ICD-10-CM | POA: Diagnosis not present

## 2016-03-10 DIAGNOSIS — Z79899 Other long term (current) drug therapy: Secondary | ICD-10-CM | POA: Diagnosis not present

## 2016-03-10 DIAGNOSIS — L89513 Pressure ulcer of right ankle, stage 3: Secondary | ICD-10-CM | POA: Diagnosis not present

## 2016-03-10 DIAGNOSIS — G4730 Sleep apnea, unspecified: Secondary | ICD-10-CM | POA: Insufficient documentation

## 2016-03-10 DIAGNOSIS — Z880 Allergy status to penicillin: Secondary | ICD-10-CM | POA: Insufficient documentation

## 2016-03-10 DIAGNOSIS — I252 Old myocardial infarction: Secondary | ICD-10-CM | POA: Diagnosis not present

## 2016-03-10 DIAGNOSIS — D649 Anemia, unspecified: Secondary | ICD-10-CM | POA: Diagnosis not present

## 2016-03-10 DIAGNOSIS — Z882 Allergy status to sulfonamides status: Secondary | ICD-10-CM | POA: Diagnosis not present

## 2016-03-10 DIAGNOSIS — Z7984 Long term (current) use of oral hypoglycemic drugs: Secondary | ICD-10-CM | POA: Insufficient documentation

## 2016-03-10 DIAGNOSIS — E441 Mild protein-calorie malnutrition: Secondary | ICD-10-CM | POA: Diagnosis not present

## 2016-03-10 DIAGNOSIS — Z7982 Long term (current) use of aspirin: Secondary | ICD-10-CM | POA: Insufficient documentation

## 2016-03-10 DIAGNOSIS — S91001A Unspecified open wound, right ankle, initial encounter: Secondary | ICD-10-CM | POA: Diagnosis not present

## 2016-03-11 NOTE — Progress Notes (Signed)
[name], [name] ([id number]) Visit Report for 03/10/2016 Allergy List Details Patient Name: [name]. Date of Service: 03/10/2016 10:30 AM Medical Record Number: [id number] Patient Account Number: [id number] Date of Birth/Sex: 1940-05-04 (76 y.o. Male) Treating RN: Cornell Barman Primary Care Erek Kowal: LADA, Rip Harbour Other Clinician: Referring Hydie Langan: Delight Hoh Treating Erna Brossard/Extender: Frann Rider in Treatment: 0 Allergies Active Allergies sulfamethoxazole Cephalosporins lisinopril losartin Keflex amoxicillin ampicillin Allergy Notes Electronic Signature(s) Signed: 03/10/2016 3:53:28 PM By: Gretta Cool, RN, BSN, Kim RN, BSN Entered By: Gretta Cool, RN, BSN, Kim on 03/10/2016 10:51:20 [name] ([id number]) -------------------------------------------------------------------------------- [place] Information Details Patient Name: [name] Date of Service: 03/10/2016 10:30 AM Medical Record Number: [id number] Patient Account Number: [id number] Date of Birth/Sex: 1940/09/10 (76 y.o. Male) Treating RN: Cornell Barman Primary Care Long Brimage: LADA, Rip Harbour Other Clinician: Referring Crystelle Ferrufino: Delight Hoh Treating Tedrick Port/Extender: Frann Rider in Treatment: 0 Visit Information Patient Arrived: Ambulatory Arrival Time: 10:47 Accompanied By: wife Transfer Assistance: None Patient Identification Verified: Yes Secondary Verification Process Yes Completed: Patient Requires Transmission- No Based Precautions: Patient Has Alerts: Yes Patient Alerts: Patient on Blood Thinner DM II 81mg  aspirin twice daily Electronic Signature(s) Signed: 03/10/2016 3:53:28 PM By: Gretta Cool, RN, BSN, Kim RN, BSN Entered By: Gretta Cool, RN, BSN, Kim on 03/10/2016 10:48:44 [name] ([id number]) -------------------------------------------------------------------------------- Clinic Level of Care Assessment Details Patient Name: [name]. Date of Service:  03/10/2016 10:30 AM Medical Record Number: [id number] Patient Account Number: [id number] Date of Birth/Sex: 1940-07-30 (76 y.o. Male) Treating RN: Cornell Barman Primary Care Danylle Ouk: LADA, Rip Harbour Other Clinician: Referring Deseri Loss: Delight Hoh Treating Medea Deines/Extender: Frann Rider in Treatment: 0 Clinic Level of Care Assessment Items TOOL 1 Quantity Score []  - Use when EandM and Procedure is performed on INITIAL visit 0 ASSESSMENTS - Nursing Assessment / Reassessment X - General Physical Exam (combine w/ comprehensive assessment (listed just 1 20 below) when performed on new pt. evals) X - Comprehensive Assessment (HX, ROS, Risk Assessments, Wounds Hx, etc.) 1 25 ASSESSMENTS - Wound and Skin Assessment / Reassessment []  - Dermatologic / Skin Assessment (not related to wound area) 0 ASSESSMENTS - Ostomy and/or Continence Assessment and Care []  - Incontinence Assessment and Management 0 []  - Ostomy Care Assessment and Management (repouching, etc.) 0 PROCESS - Coordination of Care X - Simple Patient / Family Education for ongoing care 1 15 []  - Complex (extensive) Patient / Family Education for ongoing care 0 X - Staff obtains Programmer, systems, Records, Test Results / Process Orders 1 10 []  - Staff telephones HHA, Nursing Homes / Clarify orders / etc 0 []  - Routine Transfer to another Facility (non-emergent condition) 0 []  - Routine Hospital Admission (non-emergent condition) 0 X - New Admissions / Biomedical engineer / Ordering NPWT, Apligraf, etc. 1 15 []  - Emergency Hospital Admission (emergent condition) 0 PROCESS - Special Needs []  - Pediatric / Minor Patient Management 0 []  - Isolation Patient Management 0 ERIAN, AXSON ([id number]) []  - Hearing / Language / Visual special needs 0 []  - Assessment of Community assistance (transportation, D/C planning, etc.) 0 []  - Additional assistance / Altered mentation 0 []  - Support Surface(s) Assessment (bed, cushion, seat,  etc.) 0 INTERVENTIONS - Miscellaneous []  - External ear exam 0 []  - Patient Transfer (multiple staff / Civil Service fast streamer / Similar devices) 0 []  - Simple Staple / Suture removal (25 or less) 0 []  - Complex Staple / Suture removal (26 or more) 0 []  - Hypo/Hyperglycemic Management (do not check if billed separately) 0 X -  Ankle / Brachial Index (ABI) - do not check if billed separately 1 15 Has the patient been seen at the hospital within the last three years: Yes Total Score: 100 Level Of Care: New/Established - Level 3 Electronic Signature(s) Signed: 03/10/2016 3:53:28 PM By: Gretta Cool, RN, BSN, Kim RN, BSN Entered By: Gretta Cool, RN, BSN, Kim on 03/10/2016 11:49:57 [name] ([id number]) -------------------------------------------------------------------------------- Encounter Discharge Information Details Patient Name: [name]. Date of Service: 03/10/2016 10:30 AM Medical Record Number: [id number] Patient Account Number: [id number] Date of Birth/Sex: February 05, 1941 (76 y.o. Male) Treating RN: Baruch Gouty, RN, BSN, Velva Harman Primary Care Jolene Guyett: Enid Derry Other Clinician: Referring Fernandez Kenley: Delight Hoh Treating Terral Cooks/Extender: Frann Rider in Treatment: 0 Encounter Discharge Information Items Discharge Pain Level: 0 Discharge Condition: Stable Ambulatory Status: Ambulatory Discharge Destination: Home Transportation: Private Auto Accompanied By: wife Schedule Follow-up Appointment: Yes Medication Reconciliation completed and provided to Patient/Care Yes Grey Schlauch: Provided on Clinical Summary of Care: 03/10/2016 Form Type Recipient Paper Patient FW Electronic Signature(s) Signed: 03/10/2016 3:53:28 PM By: Gretta Cool RN, BSN, Kim RN, BSN Previous Signature: 03/10/2016 11:49:41 AM Version By: Ruthine Dose Entered By: Gretta Cool RN, BSN, Kim on 03/10/2016 11:51:17 [name] ([id number]) -------------------------------------------------------------------------------- General  Visit Notes Details Patient Name: [name] Date of Service: 03/10/2016 10:30 AM Medical Record Number: [id number] Patient Account Number: [id number] Date of Birth/Sex: [birth year] (76 y.o. Male) Treating RN: Cornell Barman Primary Care Rhett Mutschler: LADA, Rip Harbour Other Clinician: Referring Tannar Broker: Delight Hoh Treating Fallan Mccarey/Extender: Frann Rider in Treatment: 0 Notes Patient given script and coupon for [facility]. If too expensive patient will get either Medi-honey or Manuka honey for wound care. Per patient request I have ordered Allevyn Border 2x2 dressing for the patient. Electronic Signature(s) Signed: 03/10/2016 4:45:13 PM By: Gretta Cool, RN, BSN, Kim RN, BSN Entered By: Gretta Cool, RN, BSN, Kim on 03/10/2016 15:58:35 [name] ([id number]) -------------------------------------------------------------------------------- Lower Extremity Assessment Details Patient Name: GUYTON, MARESCA Date of Service: 03/10/2016 10:30 AM Medical Record Number: [id number] Patient Account Number: [id number] Date of Birth/Sex: 1940-04-04 (76 y.o. Male) Treating RN: Cornell Barman Primary Care Avion Patella: LADA, Rip Harbour Other Clinician: Referring Anthonee Gelin: Delight Hoh Treating Koby Pickup/Extender: Frann Rider in Treatment: 0 Edema Assessment Assessed: [Left: No] [Right: Yes] Edema: [Left: N] [Right: o] Vascular Assessment Claudication: Claudication Assessment [Right:None] Pulses: Dorsalis Pedis Palpable: [Right:Yes] Doppler Audible: [Right:Yes] Posterior Tibial Palpable: [Right:Yes] Doppler Audible: [Right:Yes] Extremity colors, hair growth, and conditions: Extremity Color: [Right:Pale] Hair Growth on Extremity: [Right:Yes] Temperature of Extremity: [Right:Cool] Capillary Refill: [Right:< 3 seconds] Dependent Rubor: [Right:No] Blanched when Elevated: [Right:No] Lipodermatosclerosis: [Right:No] Blood Pressure: Brachial: [Right:118] Dorsalis Pedis: [Left:Dorsalis Pedis:  88] Ankle: Posterior Tibial: [Left:Posterior Tibial: 108] [Right:0.92] Toe Nail Assessment Left: Right: Thick: No Discolored: No Deformed: No Improper Length and Hygiene: No Electronic Signature(s) YAEL, FUEHRER ([id number]) Signed: 03/10/2016 3:53:28 PM By: Gretta Cool, RN, BSN, Kim RN, BSN Entered By: Gretta Cool, RN, BSN, Kim on 03/10/2016 11:18:21 [name] ([id number]) -------------------------------------------------------------------------------- Multi Wound Chart Details Patient Name: [name] Date of Service: 03/10/2016 10:30 AM Medical Record Number: [id number] Patient Account Number: [id number] Date of Birth/Sex: Jul 05, 1940 (76 y.o. Male) Treating RN: Cornell Barman Primary Care Jawanna Dykman: LADA, Rip Harbour Other Clinician: Referring Emagene Merfeld: Delight Hoh Treating Vaniah Chambers/Extender: Frann Rider in Treatment: 0 Vital Signs Height(in): 67 Pulse(bpm): 70 Weight(lbs): 128.1 Blood Pressure 162/54 (mmHg): Body Mass Index(BMI): 20 Temperature(F): 97.6 Respiratory Rate 16 (breaths/min): Photos: [N/A:N/A] Wound Location: Right Malleolus - Lateral N/A N/A Wounding Event: Gradually Appeared N/A N/A Primary Etiology: Arterial Insufficiency Ulcer N/A  N/A Date Acquired: 01/09/2016 N/A N/A Weeks of Treatment: 0 N/A N/A Wound Status: Open N/A N/A Measurements L x W x D 0.5x0.6x0.1 N/A N/A (cm) Area (cm) : 0.236 N/A N/A Volume (cm) : 0.024 N/A N/A Classification: Full Thickness Without N/A N/A Exposed Support Structures Exudate Amount: Medium N/A N/A Exudate Type: Serous N/A N/A Exudate Color: amber N/A N/A Wound Margin: Flat and Intact N/A N/A Granulation Amount: None Present (0%) N/A N/A Necrotic Amount: Large (67-100%) N/A N/A Exposed Structures: Fat Layer (Subcutaneous N/A N/A Tissue) Exposed: Yes Fascia: No Tendon: No Muscle: No ALHASSAN, PANGBURN ([id number]) Joint: No Bone: No Epithelialization: None N/A N/A Debridement: Debridement [id number]  N/A N/A [id number]) Pre-procedure 11:29 N/A N/A Verification/Time Out Taken: Pain Control: Other N/A N/A Tissue Debrided: Necrotic/Eschar, N/A N/A Fibrin/Slough, Subcutaneous Level: Skin/Subcutaneous N/A N/A Tissue Debridement Area (sq 0.3 N/A N/A cm): Instrument: Curette N/A N/A Bleeding: Minimum N/A N/A Hemostasis Achieved: Pressure N/A N/A Procedural Pain: 2 N/A N/A Post Procedural Pain: 3 N/A N/A Debridement Treatment Procedure was tolerated N/A N/A Response: well Post Debridement 0.5x0.6x0.2 N/A N/A Measurements L x W x D (cm) Post Debridement 0.047 N/A N/A Volume: (cm) Periwound Skin Texture: No Abnormalities Noted N/A N/A Periwound Skin No Abnormalities Noted N/A N/A Moisture: Periwound Skin Color: Erythema: Yes N/A N/A Erythema Location: Circumferential N/A N/A Temperature: No Abnormality N/A N/A Tenderness on Yes N/A N/A Palpation: Wound Preparation: Ulcer Cleansing: N/A N/A Rinsed/Irrigated with Saline Topical Anesthetic Applied: Other: lidociane 4% Procedures Performed: Debridement N/A N/A Treatment Notes Wound #1 (Right, Lateral Malleolus) 1. Cleansed with: Clean wound with Normal Saline BYNTLEE, CAREL ([id number]) 2. Anesthetic Topical Lidocaine 4% cream to wound bed prior to debridement 4. Dressing Applied: Santyl Ointment 5. Secondary Dressing Applied Bordered Foam Dressing Electronic Signature(s) Signed: 03/10/2016 12:02:07 PM By: Christin Fudge MD, FACS Entered By: Christin Fudge on 03/10/2016 12:02:07 [name] ([id number]) -------------------------------------------------------------------------------- [place] Details Patient Name: KAYLUB, CARRAZCO Date of Service: 03/10/2016 10:30 AM Medical Record Number: [id number] Patient Account Number: [id number] Date of Birth/Sex: 09-08-1940 (76 y.o. Male) Treating RN: Cornell Barman Primary Care Ajeenah Heiny: LADA, Rip Harbour Other Clinician: Referring Owens Hara: Delight Hoh Treating Ashwath Lasch/Extender: Frann Rider in Treatment: 0 Active Inactive ` Orientation to the Wound Care Program Nursing Diagnoses: Knowledge deficit related to the wound healing center program Goals: Patient/caregiver will verbalize understanding of the [place] Program Date Initiated: 03/10/2016 Target Resolution Date: 03/23/2016 Goal Status: Active Interventions: Provide education on orientation to the wound center Notes: ` Soft Tissue Infection Nursing Diagnoses: Impaired tissue integrity Potential for infection: soft tissue Goals: Patient will remain free of wound infection Date Initiated: 03/10/2016 Target Resolution Date: 03/17/2016 Goal Status: Active Interventions: Assess signs and symptoms of infection every visit Notes: ` Wound/Skin Impairment Nursing Diagnoses: Impaired tissue integrity ZAIDON, HIGA ([id number]) Goals: Ulcer/skin breakdown will heal within 14 weeks Date Initiated: 03/10/2016 Target Resolution Date: 03/24/2016 Goal Status: Active Interventions: Assess patient/caregiver ability to obtain necessary supplies Notes: Electronic Signature(s) Signed: 03/10/2016 3:53:28 PM By: Gretta Cool, RN, BSN, Kim RN, BSN Entered By: Gretta Cool, RN, BSN, Kim on 03/10/2016 11:31:05 [name] ([id number]) -------------------------------------------------------------------------------- Pain Assessment Details Patient Name: [name] Date of Service: 03/10/2016 10:30 AM Medical Record Number: [id number] Patient Account Number: [id number] Date of Birth/Sex: Feb 01, 1941 (76 y.o. Male) Treating RN: Cornell Barman Primary Care Kateria Cutrona: LADA, Rip Harbour Other Clinician: Referring Kaoru Benda: Delight Hoh Treating Ausencio Vaden/Extender: Frann Rider in Treatment: 0 Active Problems Location of Pain Severity and Description of  Pain Patient Has Paino No Site Locations With Dressing Change: No Rate the pain. Current Pain Level: 5 Character  of Pain Describe the Pain: Burning, Shooting, Throbbing Pain Management and Medication Current Pain Management: Medication: Yes Cold Application: Yes Rest: Yes Massage: Yes Activity: Yes T.E.N.S.: Yes Heat Application: Yes Leg drop or elevation: Yes Is the Current Pain Management Adequate Adequate: Electronic Signature(s) Signed: 03/10/2016 3:53:28 PM By: Gretta Cool, RN, BSN, Kim RN, BSN Entered By: Gretta Cool, RN, BSN, Kim on 03/10/2016 10:49:12 [name] ([id number]) -------------------------------------------------------------------------------- Patient/Caregiver Education Details Patient Name: [name] Date of Service: 03/10/2016 10:30 AM Medical Record Number: [id number] Patient Account Number: [id number] Date of Birth/Gender: 06-13-1940 (76 y.o. Male) Treating RN: Cornell Barman Primary Care Physician: LADA, Rip Harbour Other Clinician: Referring Physician: Delight Hoh Treating Physician/Extender: Frann Rider in Treatment: 0 Education Assessment Education Provided To: Patient Education Topics Provided Wound/Skin Impairment: Handouts: Caring for Your Ulcer, Other: wound care as presceribed Methods: Demonstration Responses: State content correctly Electronic Signature(s) Signed: 03/10/2016 3:53:28 PM By: Gretta Cool, RN, BSN, Kim RN, BSN Entered By: Gretta Cool, RN, BSN, Kim on 03/10/2016 11:51:37 [name] ([id number]) -------------------------------------------------------------------------------- Wound Assessment Details Patient Name: TYTAN, GARVEN. Date of Service: 03/10/2016 10:30 AM Medical Record Number: [id number] Patient Account Number: [id number] Date of Birth/Sex: [birth year] (76 y.o. Male) Treating RN: Cornell Barman Primary Care Sayra Frisby: LADA, Rip Harbour Other Clinician: Referring Waverly Chavarria: Delight Hoh Treating Joanie Duprey/Extender: Frann Rider in Treatment: 0 Wound Status Wound Number: 1 Primary Diabetic Wound/Ulcer of the Lower Etiology:  Extremity Wound Location: Right Malleolus - Lateral Secondary Pressure Ulcer Wounding Event: Gradually Appeared Etiology: Date Acquired: 01/09/2016 Wound Open Weeks Of Treatment: 0 Status: Clustered Wound: No Comorbid Cataracts, Chronic sinus History: problems/congestion, Anemia, Sleep Apnea, Hypertension, Myocardial Infarction, Type II Diabetes, Received Chemotherapy, Received Radiation Photos Wound Measurements Length: (cm) 0.5 Width: (cm) 0.6 Depth: (cm) 0.1 Area: (cm) 0.236 Volume: (cm) 0.024 % Reduction in Area: 0% % Reduction in Volume: 0% Epithelialization: None Tunneling: No Wound Description Classification: Grade 2 Foul Odor After Wound Margin: Flat and Intact Slough/Fibrino Exudate Amount: Medium Exudate Type: Serous Exudate Color: amber Cleansing: No Yes Wound Bed Granulation Amount: None Present (0%) Exposed Structure Necrotic Amount: Large (67-100%) Fascia Exposed: No Necrotic Quality: Adherent Slough Fat Layer (Subcutaneous Tissue) Exposed: Yes DAIYON, LUFT ([id number]) Tendon Exposed: No Muscle Exposed: No Joint Exposed: No Bone Exposed: No Periwound Skin Texture Texture Color No Abnormalities Noted: No No Abnormalities Noted: No Erythema: Yes Moisture Erythema Location: Circumferential No Abnormalities Noted: No Temperature / Pain Temperature: No Abnormality Tenderness on Palpation: Yes Wound Preparation Ulcer Cleansing: Rinsed/Irrigated with Saline Topical Anesthetic Applied: Other: lidociane 4%, Treatment Notes Wound #1 (Right, Lateral Malleolus) 1. Cleansed with: Clean wound with Normal Saline 2. Anesthetic Topical Lidocaine 4% cream to wound bed prior to debridement 4. Dressing Applied: Santyl Ointment 5. Secondary Dressing Applied Bordered Foam Dressing Electronic Signature(s) Signed: 03/10/2016 3:53:28 PM By: Gretta Cool, RN, BSN, Kim RN, BSN Entered By: Gretta Cool, RN, BSN, Kim on 03/10/2016 15:46:30 [name]  ([id number]) -------------------------------------------------------------------------------- [place] Details Patient Name: [name] Date of Service: 03/10/2016 10:30 AM Medical Record Number: [id number] Patient Account Number: [id number] Date of Birth/Sex: 02-Jun-1940 (76 y.o. Male) Treating RN: Cornell Barman Primary Care Debbe Crumble: LADA, Rip Harbour Other Clinician: Referring Braylynn Ghan: Delight Hoh Treating Quantavius Humm/Extender: Frann Rider in Treatment: 0 Vital Signs Time Taken: 10:49 Temperature (F): 97.6 Height (in): 67 Pulse (bpm): 70 Source: Measured Respiratory Rate (breaths/min): 16 Weight (lbs): 128.1 Blood Pressure (mmHg): 162/54 Source:  Measured Reference Range: 80 - 120 mg / dl Body Mass Index (BMI): 20.1 Electronic Signature(s) Signed: 03/10/2016 3:53:28 PM By: Gretta Cool, RN, BSN, Kim RN, BSN Entered By: Gretta Cool, RN, BSN, Kim on 03/10/2016 10:49:43

## 2016-03-11 NOTE — Progress Notes (Signed)
[name], [name] ([id number]) Visit Report for 03/10/2016 Abuse/Suicide Risk Screen Details Patient Name: [name], [name] Date of Service: 03/10/2016 10:30 AM Medical Record Number: [id number] Patient Account Number: [id number] Date of Birth/Sex: 02/04/1941 (76 y.o. Male) Treating RN: Cornell Barman Primary Care Anetra Czerwinski: LADA, Rip Harbour Other Clinician: Referring Whitney Bingaman: Delight Hoh Treating Kordel Leavy/Extender: Frann Rider in Treatment: 0 Abuse/Suicide Risk Screen Items Answer ABUSE/SUICIDE RISK SCREEN: Has anyone close to you tried to hurt or harm you recentlyo No Do you feel uncomfortable with anyone in your familyo No Has anyone forced you do things that you didnot want to doo No Do you have any thoughts of harming yourselfo No Patient displays signs or symptoms of abuse and/or neglect. No Electronic Signature(s) Signed: 03/10/2016 3:53:28 PM By: Gretta Cool, RN, BSN, Kim RN, BSN Entered By: Gretta Cool, RN, BSN, Kim on 03/10/2016 11:06:22 [name] ([id number]) -------------------------------------------------------------------------------- Activities of Daily Living Details Patient Name: [name], [name] Date of Service: 03/10/2016 10:30 AM Medical Record Number: [id number] Patient Account Number: [id number] Date of Birth/Sex: Jun 17, 1940 (76 y.o. Male) Treating RN: Cornell Barman Primary Care Adith Tejada: LADA, Rip Harbour Other Clinician: Referring Thorsten Climer: Delight Hoh Treating Parish Dubose/Extender: Frann Rider in Treatment: 0 Activities of Daily Living Items Answer Activities of Daily Living (Please select one for each item) Drive Automobile Completely Able Take Medications Completely Able Use Telephone Completely Able Care for Appearance Completely Able Use Toilet Completely Able Bath / Shower Completely Able Dress Self Completely Able Feed Self Completely Able Walk Completely Able Get In / Out Bed Completely Able Housework Completely Able Prepare Meals  Completely Able Handle Money Completely Able Shop for Self Completely Able Electronic Signature(s) Signed: 03/10/2016 3:53:28 PM By: Gretta Cool, RN, BSN, Kim RN, BSN Entered By: Gretta Cool, RN, BSN, Kim on 03/10/2016 11:06:33 [name] ([id number]) -------------------------------------------------------------------------------- Education Assessment Details Patient Name: [name] Date of Service: 03/10/2016 10:30 AM Medical Record Number: [id number] Patient Account Number: [id number] Date of Birth/Sex: 02/19/1940 (76 y.o. Male) Treating RN: Cornell Barman Primary Care Braylon Lemmons: LADA, Rip Harbour Other Clinician: Referring Vitaly Wanat: Delight Hoh Treating Adelena Desantiago/Extender: Frann Rider in Treatment: 0 Primary Learner Assessed: Patient Learning Preferences/Education Level/Primary Language Learning Preference: Explanation Highest Education Level: High School Preferred Language: English Cognitive Barrier Assessment/Beliefs Language Barrier: No Translator Needed: No Memory Deficit: No Emotional Barrier: No Cultural/Religious Beliefs Affecting Medical No Care: Physical Barrier Assessment Impaired Vision: Yes Impaired Hearing: No Decreased Hand dexterity: No Knowledge/Comprehension Assessment Knowledge Level: Medium Comprehension Level: Medium Ability to understand written Medium instructions: Ability to understand verbal Medium instructions: Motivation Assessment Anxiety Level: Calm Cooperation: Cooperative Education Importance: Acknowledges Need Interest in Health Problems: Asks Questions Perception: Coherent Willingness to Engage in Self- High Management Activities: Readiness to Engage in Self- High Management Activities: Electronic Signature(s) [name], [name] ([id number]) Signed: 03/10/2016 3:53:28 PM By: Gretta Cool, RN, BSN, Kim RN, BSN Entered By: Gretta Cool, RN, BSN, Kim on 03/10/2016 11:07:04 [name]  ([id number]) -------------------------------------------------------------------------------- Fall Risk Assessment Details Patient Name: [name] Date of Service: 03/10/2016 10:30 AM Medical Record Number: [id number] Patient Account Number: [id number] Date of Birth/Sex: Nov 27, 1940 (76 y.o. Male) Treating RN: Cornell Barman Primary Care Pharrah Rottman: LADA, Rip Harbour Other Clinician: Referring Mistina Coatney: Delight Hoh Treating Whitni Pasquini/Extender: Frann Rider in Treatment: 0 Fall Risk Assessment Items Have you had 2 or more falls in the last 12 monthso 0 Yes Have you had any fall that resulted in injury in the last 12 monthso 0 Yes FALL RISK ASSESSMENT: History of falling - immediate or within 3 months  0 No Secondary diagnosis 0 No Ambulatory aid None/bed rest/wheelchair/nurse 0 Yes Crutches/cane/walker 0 No Furniture 0 No IV Access/Saline Lock 0 No Gait/Training Normal/bed rest/immobile 0 Yes Weak 0 No Impaired 0 No Mental Status Oriented to own ability 0 Yes Electronic Signature(s) Signed: 03/10/2016 3:53:28 PM By: Gretta Cool, RN, BSN, Kim RN, BSN Entered By: Gretta Cool, RN, BSN, Kim on 03/10/2016 11:07:17 [name] ([id number]) -------------------------------------------------------------------------------- Foot Assessment Details Patient Name: [name] Date of Service: 03/10/2016 10:30 AM Medical Record Number: [id number] Patient Account Number: [id number] Date of Birth/Sex: [birth year] (76 y.o. Male) Treating RN: Cornell Barman Primary Care Aarthi Uyeno: LADA, Rip Harbour Other Clinician: Referring Beauden Tremont: Delight Hoh Treating Bethene Hankinson/Extender: Frann Rider in Treatment: 0 Foot Assessment Items Site Locations + = Sensation present, - = Sensation absent, C = Callus, U = Ulcer R = Redness, W = Warmth, M = Maceration, PU = Pre-ulcerative lesion F = Fissure, S = Swelling, D = Dryness Assessment Right: Left: Other Deformity: No No Prior Foot Ulcer: No  No Prior Amputation: No No Charcot Joint: No No Ambulatory Status: Ambulatory Without Help Gait: Steady Electronic Signature(s) Signed: 03/10/2016 3:53:28 PM By: Gretta Cool, RN, BSN, Kim RN, BSN Entered By: Gretta Cool, RN, BSN, Kim on 03/10/2016 11:08:00 [name] ([id number]) -------------------------------------------------------------------------------- Nutrition Risk Assessment Details Patient Name: [name] Date of Service: 03/10/2016 10:30 AM Medical Record Number: [id number] Patient Account Number: [id number] Date of Birth/Sex: 1940-05-21 (76 y.o. Male) Treating RN: Cornell Barman Primary Care Abiel Antrim: LADA, Rip Harbour Other Clinician: Referring Ia Leeb: Delight Hoh Treating Magnum Lunde/Extender: Frann Rider in Treatment: 0 Height (in): 67 Weight (lbs): 128.1 Body Mass Index (BMI): 20.1 Nutrition Risk Assessment Items NUTRITION RISK SCREEN: I have an illness or condition that made me change the kind and/or 2 Yes amount of food I eat I eat fewer than two meals per day 0 No I eat few fruits and vegetables, or milk products 0 No I have three or more drinks of beer, liquor or wine almost every day 0 No I have tooth or mouth problems that make it hard for me to eat 0 No I don't always have enough money to buy the food I need 0 No I eat alone most of the time 0 No I take three or more different prescribed or over-the-counter drugs a 1 Yes day Without wanting to, I have lost or gained 10 pounds in the last six 2 Yes months I am not always physically able to shop, cook and/or feed myself 0 No Nutrition Protocols Good Risk Protocol Provide education on elevated blood sugars Moderate Risk Protocol 0 and impact on wound healing, as applicable Electronic Signature(s) Signed: 03/10/2016 3:53:28 PM By: Gretta Cool, RN, BSN, Kim RN, BSN Entered By: Gretta Cool, RN, BSN, Kim on 03/10/2016 11:07:46

## 2016-03-15 NOTE — Progress Notes (Addendum)
MAJIK, PREVOT ([id number]) Visit Report for 03/10/2016 Chief Complaint Document Details Patient Name: [name], [name]. Date of Service: 03/10/2016 10:30 AM Medical Record Number: [id number] Patient Account Number: [id number] Date of Birth/Sex: 03/14/1940 (76 y.o. Male) Treating RN: Baruch Gouty, RN, BSN, Velva Harman Primary Care Provider: LADA, Rip Harbour Other Clinician: Referring Provider: LADA, [place] Treating Provider/Extender: Frann Rider in Treatment: 0 Information Obtained from: Patient Chief Complaint Patient is at the clinic for treatment of an open pressure ulcer to the right lateral ankle which she's had for about 2 months Electronic Signature(s) Signed: 03/10/2016 12:02:41 PM By: Christin Fudge MD, FACS Entered By: Christin Fudge on 03/10/2016 12:02:40 [name] ([id number]) -------------------------------------------------------------------------------- Debridement Details Patient Name: [name] Date of Service: 03/10/2016 10:30 AM Medical Record Number: [id number] Patient Account Number: [id number] Date of Birth/Sex: [birth year] (76 y.o. Male) Treating RN: Afful, RN, BSN, [place] Primary Care Provider: LADA, Rip Harbour Other Clinician: Referring Provider: LADA, [place] Treating Provider/Extender: Frann Rider in Treatment: 0 Debridement Performed for Wound #1 Right,Lateral Malleolus Assessment: Performed By: Physician Christin Fudge, MD Debridement: Debridement Pre-procedure Yes - 11:29 Verification/Time Out Taken: Start Time: 11:29 Pain Control: Other : lidocaine 4% Level: Skin/Subcutaneous Tissue Total Area Debrided (L x 0.5 (cm) x 0.6 (cm) = 0.3 (cm) W): Tissue and other Viable, Non-Viable, Eschar, Fibrin/Slough, Subcutaneous material debrided: Instrument: Curette Bleeding: Minimum Hemostasis Achieved: Pressure End Time: 11:33 Procedural Pain: 2 Post Procedural Pain: 3 Response to Treatment: Procedure was tolerated well Post Debridement  Measurements of Total Wound Length: (cm) 0.5 Width: (cm) 0.6 Depth: (cm) 0.2 Volume: (cm) 0.047 Character of Wound/Ulcer Post Requires Further Debridement Debridement: Severity of Tissue Post Debridement: Fat layer exposed Post Procedure Diagnosis Same as Pre-procedure Electronic Signature(s) Signed: 03/10/2016 12:02:15 PM By: Christin Fudge MD, FACS Signed: 03/14/2016 4:52:27 PM By: Regan Lemming BSN, RN Entered By: Christin Fudge on 03/10/2016 12:02:15 KINGSON, WELLES ([id number], CASTROGIOVANNI ([id number]) -------------------------------------------------------------------------------- HPI Details Patient Name: [name]. Date of Service: 03/10/2016 10:30 AM Medical Record Number: [id number] Patient Account Number: [id number] Date of Birth/Sex: [birth year] (76 y.o. Male) Treating RN: Baruch Gouty, RN, BSN, Velva Harman Primary Care Provider: LADA, Rip Harbour Other Clinician: Referring Provider: LADA, [place] Treating Provider/Extender: Frann Rider in Treatment: 0 History of Present Illness Location: Patient presents with an ulcer on the right lateral ankle. Quality: Patient reports experiencing a dull pain to affected area(s). Severity: Patient states wound are getting worse. Duration: Patient has had the wound for > 2 months prior to seeking treatment at the wound center Timing: Pain in wound is Intermittent (comes and goes Context: The wound appeared gradually over time Modifying Factors: Other treatment(s) tried include:local care as per the medical oncologist Associated Signs and Symptoms: Patient reports having:some pain and no discharge HPI Description: 76 year old patient has been referred by his medical oncologist Dr. Grayland Ormond, for a right lateral malleolus ulcer has been there for several weeks. 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 She is currently on chemotherapy with FOLFOX and Herceptin. past medical history  significant for diabetes mellitus, anemia, hypertension, sleep apnea, status post appendectomy, peripheral vascular catheterization( Port placement) in September 2017 by Dr. Delana Meyer.he is also receiving palliative radiation therapy and was seen by Dr. Donella Stade. Electronic Signature(s) Signed: 03/10/2016 12:03:36 PM By: Christin Fudge MD, FACS Previous Signature: 03/10/2016 11:10:46 AM Version By: Christin Fudge MD, FACS Entered By: Christin Fudge on 03/10/2016 12:03:36 [name] ([id number]) -------------------------------------------------------------------------------- Physical Exam Details Patient Name: [name],  Jorge Jensen. Date of Service: 03/10/2016 10:30 AM Medical Record Number: [id number] Patient Account Number: [id number] Date of Birth/Sex: 12/21/1940 (76 y.o. Male) Treating RN: Baruch Gouty, RN, BSN, Velva Harman Primary Care Provider: LADA, Rip Harbour Other Clinician: Referring Provider: LADA, [place] Treating Provider/Extender: Frann Rider in Treatment: 0 Constitutional . Pulse regular. Respirations normal and unlabored. Afebrile. . Eyes Nonicteric. Reactive to light. Ears, Nose, Mouth, and Throat Lips, teeth, and gums WNL.Marland Kitchen Moist mucosa without lesions. Neck supple and nontender. No palpable supraclavicular or cervical adenopathy. Normal sized without goiter. Respiratory WNL. No retractions.. Cardiovascular Pedal Pulses WNL. ABI on the right was 0.92. No clubbing, cyanosis or edema. Chest Breasts symmetical and no nipple discharge.. Breast tissue WNL, no masses, lumps, or tenderness.. Gastrointestinal (GI) Abdomen without masses or tenderness.. No liver or spleen enlargement or tenderness.. Lymphatic No adneopathy. No adenopathy. No adenopathy. Musculoskeletal Adexa without tenderness or enlargement.. Digits and nails w/o clubbing, cyanosis, infection, petechiae, ischemia, or inflammatory conditions.. Integumentary (Hair, Skin) No suspicious lesions. No crepitus or fluctuance.  No peri-wound warmth or erythema. No masses.Marland Kitchen Psychiatric Judgement and insight Intact.. No evidence of depression, anxiety, or agitation.. Notes the patient has a ulcerated area on the right lateral malleolus with subcutaneous debris and minimal surrounding erythema probably caused by pressure. Sharp debridement was done with a #3 curet.. No bleeding. Electronic Signature(s) Signed: 03/10/2016 12:04:25 PM By: Christin Fudge MD, FACS Entered By: Christin Fudge on 03/10/2016 12:04:25 [name] ([name] ([id number]) -------------------------------------------------------------------------------- Physician Orders Details Patient Name: RHOEN, LANGILL. Date of Service: 03/10/2016 10:30 AM Medical Record Number: [id number] Patient Account Number: [id number] Date of Birth/Sex: 1940/02/20 (76 y.o. Male) Treating RN: Cornell Barman Primary Care Provider: LADA, [facility] Other Clinician: Referring Provider: LADA, [place] Treating Provider/Extender: Frann Rider in Treatment: 0 Verbal / Phone Orders: No Diagnosis Coding Wound Cleansing Wound #1 Right,Lateral Malleolus o Clean wound with Normal Saline. o Cleanse wound with mild soap and water Anesthetic Wound #1 Right,Lateral Malleolus o Topical Lidocaine 4% cream applied to wound bed prior to debridement Primary Wound Dressing Wound #1 Right,Lateral Malleolus o Santyl Ointment - Or Manuka Honey Secondary Dressing Wound #1 Right,Lateral Malleolus o Boardered Foam Dressing Dressing Change Frequency Wound #1 Right,Lateral Malleolus o Change dressing every day. Follow-up Appointments Wound #1 Right,Lateral Malleolus o Return Appointment in 1 week. Radiology o X-ray, ankle - Right lateral ankle Electronic Signature(s) Signed: 03/10/2016 3:53:28 PM By: Gretta Cool RN, BSN, Kim RN, BSN Signed: 03/10/2016 4:12:12 PM By: Christin Fudge MD, FACS Entered By: Gretta Cool RN, BSN, Kim on 03/10/2016 11:44:19 [name] ([id number]) SHAHID, AMBURN ([id number]) -------------------------------------------------------------------------------- Problem List Details Patient Name: MOHSEN, HELMER Date of Service: 03/10/2016 10:30 AM Medical Record Number: [id number] Patient Account Number: [id number] Date of Birth/Sex: [birth year] (76 y.o. Male) Treating RN: Baruch Gouty, RN, BSN, Velva Harman Primary Care Provider: LADA, Rip Harbour Other Clinician: Referring Provider: LADA, [place] Treating Provider/Extender: Frann Rider in Treatment: 0 Active Problems ICD-10 Encounter Code Description Active Date Diagnosis E11.621 Type 2 diabetes mellitus with foot ulcer 03/10/2016 Yes L89.512 Pressure ulcer of right ankle, stage 2 03/10/2016 Yes 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 03/10/2016 Yes C15.5 Malignant neoplasm of lower third of esophagus 03/10/2016 Yes Inactive Problems Resolved Problems Electronic Signature(s) Signed: 03/10/2016 12:02:02 PM By: Christin Fudge MD, FACS Entered By: Christin Fudge on 03/10/2016 12:02:01 [name] ([id number]) -------------------------------------------------------------------------------- Progress Note Details Patient Name: [name] Date of Service: 03/10/2016 10:30 AM Medical Record Number: [id number] Patient Account Number: [id number] Date of Birth/Sex: 1940-08-16 (75  y.o. Male) Treating RN: Afful, RN, BSN, Velva Harman Primary Care Provider: LADA, [facility] Other Clinician: Referring Provider: LADA, MELINDA Treating Provider/Extender: Frann Rider in Treatment: 0 Subjective Chief Complaint Information obtained from Patient Patient is at the clinic for treatment of an open pressure ulcer to the right lateral ankle which she's had for about 2 months History of Present Illness (HPI) The following HPI elements were documented for the patient's wound: Location: Patient presents with an ulcer on the right lateral ankle. Quality: Patient reports experiencing a  dull pain to affected area(s). Severity: Patient states wound are getting worse. Duration: Patient has had the wound for > 2 months prior to seeking treatment at the wound center Timing: Pain in wound is Intermittent (comes and goes Context: The wound appeared gradually over time Modifying Factors: Other treatment(s) tried include:local care as per the medical oncologist Associated Signs and Symptoms: Patient reports having:some pain and no discharge 76 year old patient has been referred by his medical oncologist Dr. Grayland Ormond, for a right lateral malleolus ulcer has been there for several weeks. 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 She is currently on chemotherapy with FOLFOX and Herceptin. past medical history significant for diabetes mellitus, anemia, hypertension, sleep apnea, status post appendectomy, peripheral vascular catheterization( Port placement) in September 2017 by Dr. Delana Meyer.he is also receiving palliative radiation therapy and was seen by Dr. Donella Stade. Wound History Patient presents with 1 open wound that has been present for approximately 2 months. Patient has been treating wound in the following manner: epson salts, bandage. Laboratory tests have been performed in the last month. Patient reportedly has not tested positive for an antibiotic resistant organism. Patient reportedly has not tested positive for osteomyelitis. Patient reportedly has not had testing performed to evaluate circulation in the legs. Patient experiences the following problems associated with their wounds: infection, swelling. Patient History Information obtained from Patient. Allergies sulfamethoxazole, Cephalosporins, lisinopril, losartin, Keflex, amoxicillin, ampicillin TYION, GHOBRIAL ([id number]) Family History Cancer - Father, Heart Disease - Mother, No family history of Diabetes, Hypertension, Kidney Disease, Lung Disease, Seizures,  Stroke, Thyroid Problems, Tuberculosis. Social History Former smoker, Marital Status - Married, Alcohol Use - Rarely, Drug Use - No History, Caffeine Use - Rarely. Medical History Eyes Patient has history of Cataracts - removed left Denies history of Glaucoma, Optic Neuritis Ear/Nose/Mouth/Throat Patient has history of Chronic sinus problems/congestion Denies history of Middle ear problems Hematologic/Lymphatic Patient has history of Anemia Denies history of Hemophilia, Human Immunodeficiency Virus, Lymphedema, Sickle Cell Disease Respiratory Patient has history of Sleep Apnea Denies history of Aspiration, Asthma, Chronic Obstructive Pulmonary Disease (COPD), Pneumothorax Cardiovascular Patient has history of Hypertension, Myocardial Infarction Denies history of Angina, Arrhythmia, Congestive Heart Failure, Coronary Artery Disease, Deep Vein Thrombosis, Hypotension, Peripheral Arterial Disease, Peripheral Venous Disease, Phlebitis, Vasculitis Gastrointestinal Denies history of Cirrhosis , Colitis, Crohn s, Hepatitis A, Hepatitis B, Hepatitis C Endocrine Patient has history of Type II Diabetes Denies history of Type I Diabetes Genitourinary Denies history of End Stage Renal Disease Immunological Denies history of Lupus Erythematosus, Raynaud s, Scleroderma Integumentary (Skin) Denies history of History of Burn, History of pressure wounds Musculoskeletal Denies history of Gout, Rheumatoid Arthritis, Osteoarthritis, Osteomyelitis Neurologic Denies history of Dementia, Neuropathy, Quadriplegia, Paraplegia, Seizure Disorder Oncologic Patient has history of Received Chemotherapy - currect, Received Radiation - Oct-Nov 2017 Psychiatric Denies history of Anorexia/bulimia, Confinement Anxiety Patient is treated with Oral Agents. Blood sugar is tested. Blood sugar results noted at the  following times: Breakfast - 102, Bedtime - 63. Review of Systems (ROS) TOPHER, SHIRER  ([id number]) Constitutional Symptoms (General Health) Complains or has symptoms of Fatigue, Marked Weight Change, 9 Chemo Treatments Eyes The patient has no complaints or symptoms. Ear/Nose/Mouth/Throat The patient has no complaints or symptoms. Hematologic/Lymphatic The patient has no complaints or symptoms. Respiratory The patient has no complaints or symptoms. Cardiovascular The patient has no complaints or symptoms. Gastrointestinal The patient has no complaints or symptoms. Endocrine Complains or has symptoms of Thyroid disease. Genitourinary Denies complaints or symptoms of Kidney failure/ Dialysis, Incontinence/dribbling. Immunological Denies complaints or symptoms of Hives, Itching. Integumentary (Skin) Complains or has symptoms of Wounds. Denies complaints or symptoms of Bleeding or bruising tendency, Breakdown, Swelling. Musculoskeletal The patient has no complaints or symptoms. Neurologic The patient has no complaints or symptoms. Oncologic Espohageal and liver metastatis Psychiatric The patient has no complaints or symptoms. Medications lutein prochlorperazine maleate 10 mg tablet oral tablet oral metformin ER 1,000 mg 24 hr tablet,extended release oral tablet,ER gast.retention 24 hr oral sucralfate 100 mg/mL oral suspension oral suspension oral - wash esophagus with 15 mL metoprolol tartrate 25 mg tablet oral tablet oral Biotin 100+10 800 mcg-195 mg tablet oral tablet oral CoQ-10 100 mg capsule oral 2 2 capsule oral red yeast rice 600 mg tablet oral tablet oral iron 325 mg (65 mg iron) tablet oral tablet oral magnesium 250 mg tablet oral tablet oral multivitamin tablet oral tablet oral aspirin 81 mg tablet,delayed release oral tablet,delayed release (DR/EC) oral levothyroxine 100 mcg tablet oral tablet oral isosorbide mononitrate ER 60 mg tablet,extended release 24 hr oral tablet extended release 24 hr oral ARMAAN, RICHEL ([id number]) Vitamin D3 2,000 unit  tablet oral tablet oral Objective Constitutional Pulse regular. Respirations normal and unlabored. Afebrile. Vitals Time Taken: 10:49 AM, Height: 67 in, Source: Measured, Weight: 128.1 lbs, Source: Measured, BMI: 20.1, Temperature: 97.6 F, Pulse: 70 bpm, Respiratory Rate: 16 breaths/min, Blood Pressure: 162/54 mmHg. Eyes Nonicteric. Reactive to light. Ears, Nose, Mouth, and Throat Lips, teeth, and gums WNL.Marland Kitchen Moist mucosa without lesions. Neck supple and nontender. No palpable supraclavicular or cervical adenopathy. Normal sized without goiter. Respiratory WNL. No retractions.. Cardiovascular Pedal Pulses WNL. ABI on the right was 0.92. No clubbing, cyanosis or edema. Chest Breasts symmetical and no nipple discharge.. Breast tissue WNL, no masses, lumps, or tenderness.. Gastrointestinal (GI) Abdomen without masses or tenderness.. No liver or spleen enlargement or tenderness.. Lymphatic No adneopathy. No adenopathy. No adenopathy. Musculoskeletal Adexa without tenderness or enlargement.. Digits and nails w/o clubbing, cyanosis, infection, petechiae, ischemia, or inflammatory conditions.Marland Kitchen Psychiatric Judgement and insight Intact.. No evidence of depression, anxiety, or agitation.. General Notes: the patient has a ulcerated area on the right lateral malleolus with subcutaneous debris and minimal surrounding erythema probably caused by pressure. Sharp debridement was done with a #3 curet.Marcial Pacas, Jorge Jensen ([id number]) No bleeding. Integumentary (Hair, Skin) No suspicious lesions. No crepitus or fluctuance. No peri-wound warmth or erythema. No masses.. Wound #1 status is Open. Original cause of wound was Gradually Appeared. The wound is located on the Right,Lateral Malleolus. The wound measures 0.5cm length x 0.6cm width x 0.1cm depth; 0.236cm^2 area and 0.024cm^3 volume. There is Fat Layer (Subcutaneous Tissue) Exposed exposed. There is no tunneling noted. There is a medium amount  of serous drainage noted. The wound margin is flat and intact. There is no granulation within the wound bed. There is a large (67-100%) amount of necrotic tissue within the wound bed including Adherent Slough. The  periwound skin appearance exhibited: Erythema. The surrounding wound skin color is noted with erythema which is circumferential. Periwound temperature was noted as No Abnormality. The periwound has tenderness on palpation. Assessment Active Problems ICD-10 E11.621 - Type 2 diabetes mellitus with foot ulcer L89.512 - Pressure ulcer of right ankle, stage 2 E44.1 - Mild protein-calorie malnutrition C15.5 - Malignant neoplasm of lower third of esophagus 76 year old diabetic patient with multiple comorbidities including advanced metastatic esophageal cancer is undergoing chemotherapy and has completed his course of radiation. His diabetes is well controlled with his last hemoglobin A1c being 6.4%. She shouldn't and his wife have several concerns regarding treatment plans because of insurance coverage and financial reason but after much discussion and have agreed to follow the following recommendations: 1. Daily dressing with Santyl ointment if affordable and if not Medihoney to be used. 2. x-ray of the right ankle 3. Offloading has been discussed as much as possible in great detail 4. Adequate protein, vitamin A, vitamin C and zinc. 5. regular visits to the wound center the patient and his wife have read all questions answered and will be compliant Procedures Wound #1 KARANDEEP, [place]ILL ([id number]) Wound #1 is a pressure Ulcer located on the Right,Lateral Malleolus . There was a Skin/Subcutaneous Tissue Debridement [id number]) debridement with total area of 0.3 sq cm performed by Christin Fudge, MD. with the following instrument(s): Curette to remove Viable and Non-Viable tissue/material including Fibrin/Slough, Eschar, and Subcutaneous after achieving pain control using Other  (lidocaine 4%). A time out was conducted at 11:29, prior to the start of the procedure. A Minimum amount of bleeding was controlled with Pressure. The procedure was tolerated well with a pain level of 2 throughout and a pain level of 3 following the procedure. Post Debridement Measurements: 0.5cm length x 0.6cm width x 0.2cm depth; 0.047cm^3 volume. Character of Wound/Ulcer Post Debridement requires further debridement. Severity of Tissue Post Debridement is: Fat layer exposed. Post procedure Diagnosis Wound #1: Same as Pre-Procedure Plan Wound Cleansing: Wound #1 Right,Lateral Malleolus: Clean wound with Normal Saline. Cleanse wound with mild soap and water Anesthetic: Wound #1 Right,Lateral Malleolus: Topical Lidocaine 4% cream applied to wound bed prior to debridement Primary Wound Dressing: Wound #1 Right,Lateral Malleolus: Santyl Ointment - Or Manuka Honey Secondary Dressing: Wound #1 Right,Lateral Malleolus: Boardered Foam Dressing Dressing Change Frequency: Wound #1 Right,Lateral Malleolus: Change dressing every day. Follow-up Appointments: Wound #1 Right,Lateral Malleolus: Return Appointment in 1 week. Radiology ordered were: X-ray, ankle - Right lateral ankle 76 year old diabetic patient with multiple comorbidities including advanced metastatic esophageal cancer is undergoing chemotherapy and has completed his course of radiation. His diabetes is well controlled with his last hemoglobin A1c being 6.4%. She shouldn't and his wife have several concerns regarding treatment DAGO, MOM ([id number]) plans because of insurance coverage and financial reason but after much discussion and have agreed to follow the following recommendations: 1. Daily dressing with Santyl ointment if affordable and if not Medihoney to be used. 2. x-ray of the right ankle 3. Offloading has been discussed as much as possible in great detail 4. Adequate protein, vitamin A, vitamin C and  zinc. 5. regular visits to the wound center the patient and his wife have read all questions answered and will be compliant Electronic Signature(s) Signed: 04/19/2016 1:26:07 PM By: Gretta Cool RN, BSN, Kim RN, BSN Signed: 04/20/2016 4:20:10 PM By: Christin Fudge MD, FACS Previous Signature: 03/10/2016 4:17:00 PM Version By: Christin Fudge MD, FACS Previous Signature: 03/10/2016 12:07:02 PM Version By: Christin Fudge MD,  FACS Entered By: Gretta Cool, RN, BSN, Kim on 04/19/2016 13:26:07 [name] ([id number]) -------------------------------------------------------------------------------- ROS/PFSH Details Patient Name: [name] Date of Service: 03/10/2016 10:30 AM Medical Record Number: [id number] Patient Account Number: [id number] Date of Birth/Sex: [birth year] (76 y.o. Male) Treating RN: Cornell Barman Primary Care Provider: LADA, [place] Other Clinician: Referring Provider: LADA, [place] Treating Provider/Extender: Frann Rider in Treatment: 0 Information Obtained From Patient Wound History Do you currently have one or more open woundso Yes How many open wounds do you currently haveo 1 Approximately how long have you had your woundso 2 months How have you been treating your wound(s) until nowo epson salts, bandage Has your wound(s) ever healed and then re-openedo No Have you had any lab work done in the past montho Yes Have you tested positive for an antibiotic resistant organism (MRSA, VRE)o No Have you tested positive for osteomyelitis (bone infection)o No Have you had any tests for circulation on your legso No Have you had other problems associated with your woundso Infection, Swelling Constitutional Symptoms (General Health) Complaints and Symptoms: Positive for: Fatigue; Marked Weight Change Review of System Notes: 9 Chemo Treatments Endocrine Complaints and Symptoms: Positive for: Thyroid disease Medical History: Positive for: Type II Diabetes Negative for: Type I  Diabetes Time with diabetes: 2008 Treated with: Oral agents Blood sugar tested every day: Yes Tested : twice Blood sugar testing results: Breakfast: 102; Bedtime: 63 Genitourinary Complaints and Symptoms: Negative for: Kidney failure/ Dialysis; Incontinence/dribbling Medical History: BRANDIN, [place] ([id number]) Negative for: End Stage Renal Disease Immunological Complaints and Symptoms: Negative for: Hives; Itching Medical History: Negative for: Lupus Erythematosus; Raynaudos; Scleroderma Integumentary (Skin) Complaints and Symptoms: Positive for: Wounds Negative for: Bleeding or bruising tendency; Breakdown; Swelling Medical History: Negative for: History of Burn; History of pressure wounds Eyes Complaints and Symptoms: No Complaints or Symptoms Medical History: Positive for: Cataracts - removed left Negative for: Glaucoma; Optic Neuritis Ear/Nose/Mouth/Throat Complaints and Symptoms: No Complaints or Symptoms Medical History: Positive for: Chronic sinus problems/congestion Negative for: Middle ear problems Hematologic/Lymphatic Complaints and Symptoms: No Complaints or Symptoms Medical History: Positive for: Anemia Negative for: Hemophilia; Human Immunodeficiency Virus; Lymphedema; Sickle Cell Disease Respiratory Complaints and Symptoms: No Complaints or Symptoms Medical History: DORANCE, PATITUCCI ([id number]) Positive for: Sleep Apnea Negative for: Aspiration; Asthma; Chronic Obstructive Pulmonary Disease (COPD); Pneumothorax Cardiovascular Complaints and Symptoms: No Complaints or Symptoms Medical History: Positive for: Hypertension; Myocardial Infarction Negative for: Angina; Arrhythmia; Congestive Heart Failure; Coronary Artery Disease; Deep Vein Thrombosis; Hypotension; Peripheral Arterial Disease; Peripheral Venous Disease; Phlebitis; Vasculitis Gastrointestinal Complaints and Symptoms: No Complaints or Symptoms Medical History: Negative for:  Cirrhosis ; Colitis; Crohnos; Hepatitis A; Hepatitis B; Hepatitis C Musculoskeletal Complaints and Symptoms: No Complaints or Symptoms Medical History: Negative for: Gout; Rheumatoid Arthritis; Osteoarthritis; Osteomyelitis Neurologic Complaints and Symptoms: No Complaints or Symptoms Medical History: Negative for: Dementia; Neuropathy; Quadriplegia; Paraplegia; Seizure Disorder Oncologic Complaints and Symptoms: Review of System Notes: Espohageal and liver metastatis Medical History: Positive for: Received Chemotherapy - currect; Received Radiation - Oct-Nov 2017 Psychiatric Complaints and Symptoms: No Complaints or Symptoms Medical History: PHILEMON, MICHALAK ([id number]) Negative for: Anorexia/bulimia; Confinement Anxiety HBO Extended History Items Eyes: Ear/Nose/Mouth/Throat: Cataracts Chronic sinus problems/congestion Immunizations Pneumococcal Vaccine: Received Pneumococcal Vaccination: Yes Family and Social History Cancer: Yes - Father; Diabetes: No; Heart Disease: Yes - Mother; Hypertension: No; Kidney Disease: No; Lung Disease: No; Seizures: No; Stroke: No; Thyroid Problems: No; Tuberculosis: No; Former smoker; Marital Status - Married; Alcohol Use: Rarely; Drug Use: No  History; Caffeine Use: Rarely; Advanced Directives: No; Patient does not want information on Advanced Directives; Do not resuscitate: No; Living Will: No; Medical Power of Attorney: No Physician Affirmation I have reviewed and agree with the above information. Electronic Signature(s) Signed: 03/10/2016 3:53:28 PM By: Gretta Cool RN, BSN, Kim RN, BSN Signed: 03/10/2016 4:12:12 PM By: Christin Fudge MD, FACS Entered By: Christin Fudge on 03/10/2016 11:11:36 [name] ([id number]) -------------------------------------------------------------------------------- [place] Details Patient Name: RASHAAD, MAZZANTI. Date of Service: 03/10/2016 Medical Record Number: [id number] Patient Account Number:  [id number] Date of Birth/Sex: [birth year] (76 y.o. 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 Facility Procedures CPT4 Code: [id number] Description: [id number] - WOUND CARE VISIT-LEV 3 EST PT Modifier: Quantity: 1 CPT4 Code: [id number] Description: [id number] - DEB SUBQ TISSUE 20 SQ CM/< ICD-10 Description Diagnosis E11.621 Type 2 diabetes mellitus with foot ulcer L89.512 Pressure ulcer of right ankle, stage 2 Modifier: Quantity: 1 Physician Procedures CPT4 Code: [id number] Description: [id number] - WC PHYS LEVEL 4 - NEW PT ICD-10 Description Diagnosis E11.621 Type 2 diabetes mellitus with foot ulcer L89.512 Pressure ulcer of right ankle, stage 2 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 ([id number]) Modifier: Quantity: 1 Electronic Signature(s) Signed: 03/10/2016 4:17:15 PM By: Christin Fudge MD, FACS Previous Signature: 03/10/2016 12:07:22 PM Version By: Christin Fudge MD, FACS Entered By: Christin Fudge on 03/10/2016 16:17:15

## 2016-03-17 ENCOUNTER — Encounter: Payer: Medicare Other | Attending: Surgery | Admitting: Surgery

## 2016-03-17 DIAGNOSIS — E11621 Type 2 diabetes mellitus with foot ulcer: Secondary | ICD-10-CM | POA: Diagnosis not present

## 2016-03-17 DIAGNOSIS — L89512 Pressure ulcer of right ankle, stage 2: Secondary | ICD-10-CM | POA: Insufficient documentation

## 2016-03-17 DIAGNOSIS — Z882 Allergy status to sulfonamides status: Secondary | ICD-10-CM | POA: Diagnosis not present

## 2016-03-17 DIAGNOSIS — G4730 Sleep apnea, unspecified: Secondary | ICD-10-CM | POA: Insufficient documentation

## 2016-03-17 DIAGNOSIS — L97312 Non-pressure chronic ulcer of right ankle with fat layer exposed: Secondary | ICD-10-CM | POA: Diagnosis not present

## 2016-03-17 DIAGNOSIS — D649 Anemia, unspecified: Secondary | ICD-10-CM | POA: Insufficient documentation

## 2016-03-17 DIAGNOSIS — E441 Mild protein-calorie malnutrition: Secondary | ICD-10-CM | POA: Diagnosis not present

## 2016-03-17 DIAGNOSIS — Z880 Allergy status to penicillin: Secondary | ICD-10-CM | POA: Diagnosis not present

## 2016-03-17 DIAGNOSIS — I10 Essential (primary) hypertension: Secondary | ICD-10-CM | POA: Diagnosis not present

## 2016-03-17 DIAGNOSIS — C155 Malignant neoplasm of lower third of esophagus: Secondary | ICD-10-CM | POA: Diagnosis not present

## 2016-03-17 DIAGNOSIS — E11622 Type 2 diabetes mellitus with other skin ulcer: Secondary | ICD-10-CM | POA: Diagnosis not present

## 2016-03-17 DIAGNOSIS — I739 Peripheral vascular disease, unspecified: Secondary | ICD-10-CM | POA: Diagnosis not present

## 2016-03-18 NOTE — Progress Notes (Addendum)
[name], [name] ([id number]) Visit Report for 03/17/2016 Chief Complaint Document Details Patient Name: [name], [name]. Date of Service: 03/17/2016 1:30 PM Medical Record Number: [id number] Patient Account Number: [id number] Date of Birth/Sex: [birth year] (76 y.o. Male) Treating RN: Baruch Gouty, RN, BSN, Velva Harman Primary Care Provider: LADA, Rip Harbour Other Clinician: Referring Provider: LADA, [place] Treating Provider/Extender: Frann Rider in Treatment: 1 Information Obtained from: Patient Chief Complaint Patient is at the clinic for treatment of an open pressure ulcer to the right lateral ankle which she's had for about 2 months Electronic Signature(s) Signed: 03/17/2016 1:48:28 PM By: Christin Fudge MD, FACS Entered By: Christin Fudge on 03/17/2016 13:48:28 [name] ([id number]) -------------------------------------------------------------------------------- [place] Details Patient Name: [name] Date of Service: 03/17/2016 1:30 PM Medical Record Number: [id number] Patient Account Number: [id number] Date of Birth/Sex: 19-Jul-1940 (76 y.o. Male) Treating RN: Afful, RN, BSN, [place] Primary Care Provider: LADA, Rip Harbour Other Clinician: Referring Provider: LADA, [place] Treating Provider/Extender: Frann Rider in Treatment: 1 Debridement Performed for Wound #1 Right,Lateral Malleolus Assessment: Performed By: Physician Christin Fudge, MD Debridement: Debridement Pre-procedure Yes - 13:44 Verification/Time Out Taken: Start Time: 13:44 Pain Control: Lidocaine 4% Topical Solution Level: Skin/Subcutaneous Tissue Total Area Debrided (L x 1 (cm) x 1 (cm) = 1 (cm) W): Tissue and other Non-Viable, Fibrin/Slough, Subcutaneous material debrided: Instrument: Curette Bleeding: Minimum Hemostasis Achieved: Pressure End Time: 13:48 Procedural Pain: 0 Post Procedural Pain: 0 Response to Treatment: Procedure was tolerated well Post Debridement Measurements of Total  Wound Length: (cm) 1 Width: (cm) 1 Depth: (cm) 0.1 Volume: (cm) 0.079 Character of Wound/Ulcer Post Requires Further Debridement Debridement: Severity of Tissue Post Debridement: Fat layer exposed Post Procedure Diagnosis Same as Pre-procedure Electronic Signature(s) Signed: 03/17/2016 1:48:18 PM By: Christin Fudge MD, FACS Signed: 03/17/2016 2:17:37 PM By: Regan Lemming BSN, RN Entered By: Christin Fudge on 03/17/2016 13:48:18 [name], [name] ([id number], KOBZA ([id number]) -------------------------------------------------------------------------------- [place] Details Patient Name: [name], [name]. Date of Service: 03/17/2016 1:30 PM Medical Record Number: [id number] Patient Account Number: [id number] Date of Birth/Sex: [birth year] (76 y.o. Male) Treating RN: Baruch Gouty, RN, BSN, Velva Harman Primary Care Provider: LADA, Rip Harbour Other Clinician: Referring Provider: LADA, [place] Treating Provider/Extender: Frann Rider in Treatment: 1 History of Present Illness Location: Patient presents with an ulcer on the right lateral ankle. Quality: Patient reports experiencing a dull pain to affected area(s). Severity: Patient states wound are getting worse. Duration: Patient has had the wound for > 2 months prior to seeking treatment at the wound center Timing: Pain in wound is Intermittent (comes and goes Context: The wound appeared gradually over time Modifying Factors: Other treatment(s) tried include:local care as per the medical oncologist Associated Signs and Symptoms: Patient reports having:some pain and no discharge HPI Description: 76 year old patient has been referred by his medical oncologist Dr. Grayland Ormond, for a right lateral malleolus ulcer has been there for several weeks. 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 She is currently on chemotherapy with FOLFOX and Herceptin. past medical history significant for diabetes  mellitus, anemia, hypertension, sleep apnea, status post appendectomy, peripheral vascular catheterization( Port placement) in September 2017 by Dr. Delana Meyer.he is also receiving palliative radiation therapy and was seen by Dr. Donella Stade. 03/17/2016 -- - x-ray of the right ankle -- IMPRESSION: 1. Soft tissue wound noted over the lateral malleolus, no underlying bony abnormality. No acute bony abnormality. 2. Diffuse degenerative change. 3. Peripheral vascular disease Electronic Signature(s) Signed: 03/17/2016 1:48:37 PM By:  Christin Fudge MD, FACS Previous Signature: 03/17/2016 1:34:52 PM Version By: Christin Fudge MD, FACS Entered By: Christin Fudge on 03/17/2016 13:48:37 [name] ([id number]) -------------------------------------------------------------------------------- Physical Exam Details Patient Name: [name], [name] Date of Service: 03/17/2016 1:30 PM Medical Record Number: [id number] Patient Account Number: [id number] Date of Birth/Sex: Sep 24, 1940 (76 y.o. Male) Treating RN: Baruch Gouty, RN, BSN, Velva Harman Primary Care Provider: LADA, Rip Harbour Other Clinician: Referring Provider: LADA, [place] Treating Provider/Extender: Frann Rider in Treatment: 1 Constitutional . Pulse regular. Respirations normal and unlabored. Afebrile. . Eyes Nonicteric. Reactive to light. Ears, Nose, Mouth, and Throat Lips, teeth, and gums WNL.Marland Kitchen Moist mucosa without lesions. Neck supple and nontender. No palpable supraclavicular or cervical adenopathy. Normal sized without goiter. Respiratory WNL. No retractions.. Breath sounds WNL, No rubs, rales, rhonchi, or wheeze.. Cardiovascular Heart rhythm and rate regular, no murmur or gallop.. Pedal Pulses WNL. No clubbing, cyanosis or edema. Lymphatic No adneopathy. No adenopathy. No adenopathy. Musculoskeletal Adexa without tenderness or enlargement.. Digits and nails w/o clubbing, cyanosis, infection, petechiae, ischemia, or inflammatory  conditions.. Integumentary (Hair, Skin) No suspicious lesions. No crepitus or fluctuance. No peri-wound warmth or erythema. No masses.Marland Kitchen Psychiatric Judgement and insight Intact.. No evidence of depression, anxiety, or agitation.. Notes the ulcerated area continues to have some subcutaneous debris and using a #3 curet I was sharply able to remove some of this and bleeding controlled with pressure Electronic Signature(s) Signed: 03/17/2016 1:49:06 PM By: Christin Fudge MD, FACS Entered By: Christin Fudge on 03/17/2016 13:49:06 [name] ([id number]) -------------------------------------------------------------------------------- Physician Orders Details Patient Name: [name] Date of Service: 03/17/2016 1:30 PM Medical Record Number: [id number] Patient Account Number: [id number] Date of Birth/Sex: 1940/04/11 (76 y.o. Male) Treating RN: Baruch Gouty, RN, BSN, Velva Har[place] Primary Care Provider: LADA, Rip Harbour Other Clinician: Referring Provider: LADA, [place] Treating Provider/Extender: Frann Rider in Treatment: 1 Verbal / Phone Orders: No Diagnosis Coding Wound Cleansing Wound #1 Right,Lateral Malleolus o Clean wound with Normal Saline. o Cleanse wound with mild soap and water Anesthetic Wound #1 Right,Lateral Malleolus o Topical Lidocaine 4% cream applied to wound bed prior to debridement Primary Wound Dressing Wound #1 Right,Lateral Malleolus o Santyl Ointment - Or Manuka Honey Secondary Dressing Wound #1 Right,Lateral Malleolus o Dry Gauze o Boardered Foam Dressing Dressing Change Frequency Wound #1 Right,Lateral Malleolus o Change dressing every day. Follow-up Appointments Wound #1 Right,Lateral Malleolus o Return Appointment in 1 week. Medications-please add to medication list. Wound #1 Right,Lateral Malleolus o Other: - Include these in your diet...VITAMIN A, C, ZINC, MVI Electronic Signature(s) Signed: 03/17/2016 2:17:37 PM By: Regan Lemming BSN,  RN Signed: 03/17/2016 3:31:48 PM By: Christin Fudge MD, FACS [name], [name] ([id number]) Entered By: Regan Lemming on 03/17/2016 13:46:00 [name] ([id number]) -------------------------------------------------------------------------------- Problem List Details Patient Name: TAISHAUN, GARGAN Date of Service: 03/17/2016 1:30 PM Medical Record Number: [id number] Patient Account Number: [id number] Date of Birth/Sex: 13-Dec-1940 (76 y.o. 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 Male) Treating RN: Baruch Gouty, RN, BSN, Vel[place] Harman Primary Care Provider: LADA, Rip Harbour Other Clinician: Referring Provider: LADA, M[place]IN[place] Treating Provider/Extender: Frann Rider in Treatment: 1 Subjective Chief Complaint Information obtained from Patient Patient is at the clinic for treatment of an open pressure ulcer to the right lateral ankle which she's had for about 2 months History of Present Illness (HPI) The following HPI elements were documented for the patient's  wound: Location: Patient presents with an ulcer on the right lateral ankle. Quality: Patient reports experiencing a dull pain to affected area(s). Severity: Patient states wound are getting worse. Duration: Patient has had the wound for > 2 months prior to seeking treatment at the wound center Timing: Pain in wound is Intermittent (comes and goes Context: The wound appeared gradually over time Modifying Factors: Other treatment(s) tried include:local care as per the medical oncologist Associated Signs and Symptoms: Patient reports having:some pain and no discharge 76 year old patient has been referred by his medical oncologist Dr. Grayland Ormond, for a right lateral malleolus ulcer has been there for several weeks. 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 She is currently on chemotherapy with FOLFOX and Herceptin. past medical history significant for diabetes mellitus, anemia, hypertension, sleep apnea, status post appendectomy, peripheral vascular catheterization( Port placement) in September 2017 by Dr. Delana Meyer.he is also receiving palliative radiation therapy and was seen by Dr. Donella Stade. 03/17/2016 -- - x-ray of the right ankle -- IMPRESSION: 1. Soft tissue wound noted over the lateral malleolus, no underlying bony abnormality. No acute bony abnormality. 2. Diffuse degenerative change. 3. Peripheral vascular disease Allergies sulfamethoxazole, Cephalosporins, lisinopril, losartin, Keflex, amoxicillin, ampicillin Objective [name], [name] ([id number]) Constitutional Pulse regular. Respirations normal and unlabored. Afebrile. Vitals Time Taken: 1:35 PM, Height: 67 in, Weight: 128.1 lbs, BMI: 20.1, Temperature: 97.7 F, Pulse: 72 bpm, Respiratory Rate: 16 breaths/min, Blood Pressure: 143/54 mmHg. Eyes Nonicteric. Reactive to light. Ears, Nose, Mouth, and Throat Lips, teeth, and gums WNL.Marland Kitchen Moist mucosa without lesions. Neck supple and  nontender. No palpable supraclavicular or cervical adenopathy. Normal sized without goiter. Respiratory WNL. No retractions.. Breath sounds WNL, No rubs, rales, rhonchi, or wheeze.. Cardiovascular Heart rhythm and rate regular, no murmur or gallop.. Pedal Pulses WNL. No clubbing, cyanosis or edema. Lymphatic No adneopathy. No adenopathy. No adenopathy. Musculoskeletal Adexa without tenderness or enlargement.. Digits and nails w/o clubbing, cyanosis, infection, petechiae, ischemia, or inflammatory conditions.Marland Kitchen Psychiatric Judgement and insight Intact.. No evidence of depression, anxiety, or agitation.. General Notes: the ulcerated area continues to have some subcutaneous debris and using a #3 curet I was sharply able to remove some of this and bleeding controlled with pressure Integumentary (Hair, Skin) No suspicious lesions. No crepitus or fluctuance. No peri-wound warmth or erythema. No masses.. Wound #1 status is Open. Original cause of wound was Gradually Appeared. The wound is located on the Right,Lateral Malleolus. The wound measures 1cm length x 1cm width x 0.1cm depth; 0.785cm^2 area and 0.079cm^3 volume. There is Fat Layer (Subcutaneous Tissue) Exposed exposed. There is no tunneling or undermining noted. There is a medium amount of serous drainage noted. The wound margin is flat and intact. There is no granulation within the wound bed. There is a large (67-100%) amount of necrotic tissue within the wound bed including Adherent Slough. The periwound skin appearance exhibited: Erythema. The surrounding wound skin  color is noted with erythema which is circumferential. Periwound temperature was noted as No Abnormality. The periwound has tenderness on palpation. [name], [name] ([id number]) Assessment Active Problems ICD-10 E11.621 - Type 2 diabetes mellitus with foot ulcer L89.512 - Pressure ulcer of right ankle, stage 2 E44.1 - Mild protein-calorie malnutrition C15.5 - Malignant  neoplasm of lower third of esophagus Procedures Wound #1 Wound #1 is a Diabetic Wound/Ulcer of the Lower Extremity located on the Right,Lateral Malleolus . There was a Skin/Subcutaneous Tissue Debridement [id number]) debridement with total area of 1 sq cm performed by Christin Fudge, MD. with the following instrument(s): Curette to remove Non-Viable tissue/material including Fibrin/Slough and Subcutaneous after achieving pain control using Lidocaine 4% Topical Solution. A time out was conducted at 13:44, prior to the start of the procedure. A Minimum amount of bleeding was controlled with Pressure. The procedure was tolerated well with a pain level of 0 throughout and a pain level of 0 following the procedure. Post Debridement Measurements: 1cm length x 1cm width x 0.1cm depth; 0.079cm^3 volume. Character of Wound/Ulcer Post Debridement requires further debridement. Severity of Tissue Post Debridement is: Fat layer exposed. Post procedure Diagnosis Wound #1: Same as Pre-Procedure Plan Wound Cleansing: Wound #1 Right,Lateral Malleolus: Clean wound with Normal Saline. Cleanse wound with mild soap and water Anesthetic: Wound #1 Right,Lateral Malleolus: Topical Lidocaine 4% cream applied to wound bed prior to debridement Primary Wound Dressing: Wound #1 Right,Lateral Malleolus: Santyl Ointment - Or [place]uka Honey [name], [name] ([id number]) Secondary Dressing: Wound #1 Right,Lateral Malleolus: Dry Gauze Boardered Foam Dressing Dressing Change Frequency: Wound #1 Right,Lateral Malleolus: Change dressing every day. Follow-up Appointments: Wound #1 Right,Lateral Malleolus: Return Appointment in 1 week. Medications-please add to medication list.: Wound #1 Right,Lateral Malleolus: Other: - Include these in your diet...VITAMIN A, C, ZINC, MVI I have asked them to follow the following recommendations: 1. Daily dressing with Medihoney to be used (Santyl ointment was not affordable) . 2.  x-ray of the right ankle -- x-ray results discussed with them 3. Offloading has been discussed as much as possible in great detail 4. Adequate protein, vitamin A, vitamin C and zinc. 5. regular visits to the wound center the patient and his wife have read all questions answered and will be compliant Electronic Signature(s) Signed: 03/23/2016 4:24:50 PM By: Christin Fudge MD, FACS Previous Signature: 03/17/2016 1:51:04 PM Version By: Christin Fudge MD, FACS Entered By: Christin Fudge on 03/23/2016 16:24:50 [name] ([id number]) -------------------------------------------------------------------------------- [place] Details Patient Name: [name] Date of Service: 03/17/2016 Medical Record Number: [id number] Patient Account Number: [id number] Date of Birth/Sex: 06-04-1940 (76 y.o. 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 C15.5 Malignant neoplasm of lower third of esophagus Facility Procedures CPT4 Code: [id number] Description: [id number] - DEB SUBQ TISSUE 20 SQ CM/< ICD-10 Description Diagnosis E11.621 Type 2 diabetes mellitus with foot ulcer L89.512 Pressure ulcer of right ankle, stage 2 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 C15.5 Malignant neoplasm of lower third  of esophagus Modifier: Quantity: 1 Physician Procedures CPT4 Code: [id number] Description: [id number] - WC PHYS LEVEL 3 - EST PT ICD-10 Description Diagnosis E11.621 Type 2 diabetes mellitus with foot ulcer L89.512 Pressure ulcer of right ankle, stage 2 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 ([id number]) Modifier: Quantity: 1 Electronic Signature(s) Signed: 03/17/2016 1:51:46 PM By: Christin Fudge MD, FACS Entered By: Christin Fudge on 03/17/2016 13:51:46

## 2016-03-18 NOTE — Progress Notes (Addendum)
MATVEY, SANTILLANA ([id number]) Visit Report for 03/17/2016 Allergy List Details Patient Name: [name], CONTO. Date of Service: 03/17/2016 1:30 PM Medical Record Number: [id number] Patient Account Number: [id number] Date of Birth/Sex: August 28, 1940 (76 y.o. Male) Treating RN: Cornell Barman Primary Care Ellinor Test: LADA, [facility] Other Clinician: Referring Raghav Verrilli: LADA, [place] Treating Lucita Montoya/Extender: Frann Rider in Treatment: 1 Allergies Active Allergies sulfamethoxazole Cephalosporins lisinopril losartin Keflex amoxicillin ampicillin Allergy Notes Electronic Signature(s) Signed: 03/21/2016 5:09:04 PM By: Gretta Cool, RN, BSN, Kim RN, BSN Entered By: Gretta Cool, RN, BSN, Kim on 03/21/2016 09:43:32 [name] ([id number]) -------------------------------------------------------------------------------- Arrival Information Details Patient Name: [name] Date of Service: 03/17/2016 1:30 PM Medical Record Number: [id number] Patient Account Number: [id number] Date of Birth/Sex: 02/09/1941 (76 y.o. Male) Treating RN: Afful, RN, BSN, [place] Primary Care Charlot Gouin: LADA, [facility] Other Clinician: Referring Tajuan Dufault: LADA, [place] Treating Rockwell Zentz/Extender: Frann Rider in Treatment: 1 Visit Information Patient Arrived: Ambulatory Arrival Time: 13:33 Accompanied By: wife Transfer Assistance: None Patient Identification Verified: Yes Secondary Verification Process Yes Completed: Patient Requires Transmission- No Based Precautions: Patient Has Alerts: Yes Patient Alerts: Patient on Blood Thinner DM II 81mg  aspirin twice daily History Since Last Visit All ordered tests and consults were completed: No Added or deleted any medications: No Any new allergies or adverse reactions: No Had a fall or experienced change in activities of daily living that may affect risk of falls: No Signs or symptoms of abuse/neglect since last visito No Hospitalized since last visit: No Has  Dressing in Place as Prescribed: Yes Electronic Signature(s) Signed: 03/17/2016 2:17:37 PM By: Regan Lemming BSN, RN Entered By: Regan Lemming on 03/17/2016 13:35:32 [name] ([id number]) -------------------------------------------------------------------------------- Encounter Discharge Information Details Patient Name: [name] Date of Service: 03/17/2016 1:30 PM Medical Record Number: [id number] Patient Account Number: [id number] Date of Birth/Sex: April 18, 1940 (76 y.o. Male) Treating RN: Baruch Gouty, RN, BSN, Velva Harman Primary Care Ethyn Schetter: LADA, Rip Harbour Other Clinician: Referring Mikayela Deats: LADA, [place] Treating Newt Levingston/Extender: Frann Rider in Treatment: 1 Encounter Discharge Information Items Discharge Pain Level: 0 Discharge Condition: Stable Ambulatory Status: Ambulatory Discharge Destination: Home Transportation: Private Auto Accompanied By: wife Schedule Follow-up Appointment: No Medication Reconciliation completed and provided to Patient/Care No Janessa Mickle: Provided on Clinical Summary of Care: 03/17/2016 Form Type Recipient Paper Patient FW Electronic Signature(s) Signed: 03/17/2016 1:59:30 PM By: Ruthine Dose Entered By: Ruthine Dose on 03/17/2016 13:59:30 [name] ([id number]) -------------------------------------------------------------------------------- Lower Extremity Assessment Details Patient Name: [name] Date of Service: 03/17/2016 1:30 PM Medical Record Number: [id number] Patient Account Number: [id number] Date of Birth/Sex: [birth year] (76 y.o. Male) Treating RN: Baruch Gouty, RN, BSN, Velva Harman Primary Care Kell Ferris: LADA, Rip Harbour Other Clinician: Referring Aubryn Spinola: LADA, [place] Treating Keymora Grillot/Extender: Frann Rider in Treatment: 1 Vascular Assessment Claudication: Claudication Assessment [Right:None] Pulses: Dorsalis Pedis Palpable: [Right:Yes] Posterior Tibial Extremity colors, hair growth, and conditions: Extremity Color:  [Right:Normal] Hair Growth on Extremity: [Right:Yes] Temperature of Extremity: [Right:Warm] Capillary Refill: [Right:< 3 seconds] Toe Nail Assessment Left: Right: Thick: Yes Discolored: Yes Deformed: No Improper Length and Hygiene: No Electronic Signature(s) Signed: 03/17/2016 2:17:37 PM By: Regan Lemming BSN, RN Entered By: Regan Lemming on 03/17/2016 13:40:15 [name] ([id number]) -------------------------------------------------------------------------------- Multi Wound Chart Details Patient Name: [name] Date of Service: 03/17/2016 1:30 PM Medical Record Number: [id number] Patient Account Number: [id number] Date of Birth/Sex: 1940/07/09 (76 y.o. Male) Treating RN: Baruch Gouty, RN, BSN, Velva Harman Primary Care Terriona Horlacher: LADA, Rip Harbour Other Clinician: Referring Teliah Buffalo: LADA, [place] Treating Archita Lomeli/Extender: Frann Rider in Treatment: 1 Vital Signs Height(in):  67 Pulse(bpm): 72 Weight(lbs): 128.1 Blood Pressure 143/54 (mmHg): Body Mass Index(BMI): 20 Temperature(F): 97.7 Respiratory Rate 16 (breaths/min): Photos: [1:No Photos] [N/A:N/A] Wound Location: [1:Right Malleolus - Lateral] [N/A:N/A] Wounding Event: [1:Gradually Appeared] [N/A:N/A] Primary Etiology: [1:Diabetic Wound/Ulcer of the Lower Extremity] [N/A:N/A] Secondary Etiology: [1:Pressure Ulcer] [N/A:N/A] Comorbid History: [1:Cataracts, Chronic sinus problems/congestion, Anemia, Sleep Apnea, Hypertension, Myocardial Infarction, Type II Diabetes, Received Chemotherapy, Received Radiation] [N/A:N/A] Date Acquired: [1:01/09/2016] [N/A:N/A] Weeks of Treatment: [1:1] [N/A:N/A] Wound Status: [1:Open] [N/A:N/A] Measurements L x W x D 1x1x0.1 [N/A:N/A] (cm) Area (cm) : [1:0.785] [N/A:N/A] Volume (cm) : [1:0.079] [N/A:N/A] % Reduction in Area: [1:-232.60%] [N/A:N/A] % Reduction in Volume: -229.20% [N/A:N/A] Classification: [1:Grade 2] [N/A:N/A] Exudate Amount: [1:Medium] [N/A:N/A] Exudate Type:  [1:Serous] [N/A:N/A] Exudate Color: [1:amber] [N/A:N/A] Wound Margin: [1:Flat and Intact] [N/A:N/A] Granulation Amount: [1:None Present (0%)] [N/A:N/A] Necrotic Amount: [1:Large (67-100%)] [N/A:N/A] Exposed Structures: Fat Layer (Subcutaneous N/A N/A Tissue) Exposed: Yes Fascia: No Tendon: No Muscle: No Joint: No Bone: No Epithelialization: None N/A N/A Debridement: Debridement [id number] N/A N/A [id number]) Pre-procedure 13:44 N/A N/A Verification/Time Out Taken: Pain Control: Lidocaine 4% Topical N/A N/A Solution Tissue Debrided: Fibrin/Slough, N/A N/A Subcutaneous Level: Skin/Subcutaneous N/A N/A Tissue Debridement Area (sq 1 N/A N/A cm): Instrument: Curette N/A N/A Bleeding: Minimum N/A N/A Hemostasis Achieved: Pressure N/A N/A Procedural Pain: 0 N/A N/A Post Procedural Pain: 0 N/A N/A Debridement Treatment Procedure was tolerated N/A N/A Response: well Post Debridement 1x1x0.1 N/A N/A Measurements L x W x D (cm) Post Debridement 0.079 N/A N/A Volume: (cm) Periwound Skin Texture: No Abnormalities Noted N/A N/A Periwound Skin No Abnormalities Noted N/A N/A Moisture: Periwound Skin Color: Erythema: Yes N/A N/A Erythema Location: Circumferential N/A N/A Temperature: No Abnormality N/A N/A Tenderness on Yes N/A N/A Palpation: Wound Preparation: Ulcer Cleansing: N/A N/A Rinsed/Irrigated with Saline Topical Anesthetic Applied: Other: lidociane 4% Procedures Performed: Debridement N/A N/A [name], [name] ([id number]) Treatment Notes Wound #1 (Right, Lateral Malleolus) 1. Cleansed with: Clean wound with Normal Saline 4. Dressing Applied: Santyl Ointment 5. Secondary Dressing Applied Bordered Foam Dressing Dry Gauze Electronic Signature(s) Signed: 03/17/2016 1:48:11 PM By: Christin Fudge MD, FACS Entered By: Christin Fudge on 03/17/2016 13:48:11 [name]  ([id number]) -------------------------------------------------------------------------------- [place] Details Patient Name: [name], [name] Date of Service: 03/17/2016 1:30 PM Medical Record Number: [id number] Patient Account Number: [id number] Date of Birth/Sex: 30-Jul-1940 (76 y.o. Male) Treating RN: Baruch Gouty, RN, BSN, Velva Harman Primary Care Aaronjames Kelsay: LADA, Rip Harbour Other Clinician: Referring Naveen Clardy: LADA, [place] Treating Tongela Encinas/Extender: Frann Rider in Treatment: 1 Active Inactive ` Orientation to the Wound Care Program Nursing Diagnoses: Knowledge deficit related to the wound healing center program Goals: Patient/caregiver will verbalize understanding of the [facility] Date Initiated: 03/10/2016 Target Resolution Date: 03/23/2016 Goal Status: Active Interventions: Provide education on orientation to the wound center Notes: ` Soft Tissue Infection Nursing Diagnoses: Impaired tissue integrity Potential for infection: soft tissue Goals: Patient will remain free of wound infection Date Initiated: 03/10/2016 Target Resolution Date: 03/17/2016 Goal Status: Active Interventions: Assess signs and symptoms of infection every visit Notes: ` Wound/Skin Impairment Nursing Diagnoses: Impaired tissue integrity [name], [name] ([id number]) Goals: Ulcer/skin breakdown will heal within 14 weeks Date Initiated: 03/10/2016 Target Resolution Date: 03/24/2016 Goal Status: Active Interventions: Assess patient/caregiver ability to obtain necessary supplies Notes: Electronic Signature(s) Signed: 03/17/2016 2:17:37 PM By: Regan Lemming BSN, RN Entered By: Regan Lemming on 03/17/2016 13:42:15 [name] ([id number]) -------------------------------------------------------------------------------- Pain Assessment Details Patient Name: [name] Date of Service: 03/17/2016  1:30 PM Medical Record Number: [id number] Patient Account Number:  [id number] Date of Birth/Sex: 1940-09-22 (76 y.o. Male) Treating RN: Baruch Gouty, RN, BSN, Velva Harman Primary Care Melda Mermelstein: LADA, Rip Harbour Other Clinician: Referring Damontay Alred: LADA, [place] Treating Lashann Hagg/Extender: Frann Rider in Treatment: 1 Active Problems Location of Pain Severity and Description of Pain Patient Has Paino Yes Site Locations Pain Location: Pain in Ulcers With Dressing Change: No Character of Pain Describe the Pain: Aching, Tender Pain Management and Medication Current Pain Management: Medication: Yes Rest: Yes How does your pain impact your activities of daily livingo Sleep: Yes Bathing: Yes Appetite: Yes Relationship With Others: Yes Bladder Continence: Yes Emotions: Yes Bowel Continence: Yes Work: Yes Toileting: Yes Drive: Yes Dressing: Yes Hobbies: Yes Electronic Signature(s) Signed: 03/17/2016 2:17:37 PM By: Regan Lemming BSN, RN Entered By: Regan Lemming on 03/17/2016 13:35:52 [name] ([id number]) -------------------------------------------------------------------------------- Patient/Caregiver Education Details Patient Name: [name] Date of Service: 03/17/2016 1:30 PM Medical Record Number: [id number] Patient Account Number: [id number] Date of Birth/Gender: April 30, 1940 (76 y.o. Male) Treating RN: Baruch Gouty, RN, BSN, Velva Harman Primary Care Physician: LADA, Rip Harbour Other Clinician: Referring Physician: LADA, [place] Treating Physician/Extender: Frann Rider in Treatment: 1 Education Assessment Education Provided To: Patient Education Topics Provided Welcome To [facility]: Methods: Explain/Verbal Responses: State content correctly Wound Debridement: Methods: Explain/Verbal Responses: State content correctly Wound/Skin Impairment: Methods: Explain/Verbal Responses: State content correctly Electronic Signature(s) Signed: 03/17/2016 2:17:37 PM By: Regan Lemming BSN, RN Entered By: Regan Lemming on 03/17/2016 13:53:59 [name] ([id number]) -------------------------------------------------------------------------------- Wound Assessment Details Patient Name: [name] Date of Service: 03/17/2016 1:30 PM Medical Record Number: [id number] Patient Account Number: [id number] Date of Birth/Sex: 1940-05-27 (76 y.o. Male) Treating RN: Afful, RN, BSN, [place] Primary Care Reita Shindler: LADA, Rip Harbour Other Clinician: Referring Ahmari Garton: LADA, [place] Treating Jerid Catherman/Extender: Frann Rider in Treatment: 1 Wound Status Wound Number: 1 Primary Diabetic Wound/Ulcer of the Lower Etiology: Extremity Wound Location: Right Malleolus - Lateral Secondary Pressure Ulcer Wounding Event: Gradually Appeared Etiology: Date Acquired: 01/09/2016 Wound Open Weeks Of Treatment: 1 Status: Clustered Wound: No Comorbid Cataracts, Chronic sinus History: problems/congestion, Anemia, Sleep Apnea, Hypertension, Myocardial Infarction, Type II Diabetes, Received Chemotherapy, Received Radiation Photos Photo Uploaded By: Regan Lemming on 03/17/2016 14:14:36 Wound Measurements Length: (cm) 1 Width: (cm) 1 Depth: (cm) 0.1 Area: (cm) 0.785 Volume: (cm) 0.079 % Reduction in Area: -232.6% % Reduction in Volume: -229.2% Epithelialization: None Tunneling: No Undermining: No Wound Description Classification: Grade 2 Wound Margin: Flat and Intact Exudate Amount: Medium Exudate Type: Serous Exudate Color: amber Foul Odor After Cleansing: No Slough/Fibrino Yes Wound Bed [name], [name] ([id number]) Granulation Amount: None Present (0%) Exposed Structure Necrotic Amount: Large (67-100%) Fascia Exposed: No Necrotic Quality: Adherent Slough Fat Layer (Subcutaneous Tissue) Exposed: Yes Tendon Exposed: No Muscle Exposed: No Joint Exposed: No Bone Exposed: No Periwound Skin Texture Texture Color No Abnormalities Noted: No No Abnormalities Noted: No Erythema: Yes Moisture Erythema Location: Circumferential No  Abnormalities Noted: No Temperature / Pain Temperature: No Abnormality Tenderness on Palpation: Yes Wound Preparation Ulcer Cleansing: Rinsed/Irrigated with Saline Topical Anesthetic Applied: Other: lidociane 4%, Treatment Notes Wound #1 (Right, Lateral Malleolus) 1. Cleansed with: Clean wound with Normal Saline 4. Dressing Applied: Santyl Ointment 5. Secondary Dressing Applied Bordered Foam Dressing Dry Gauze Electronic Signature(s) Signed: 03/17/2016 2:17:37 PM By: Regan Lemming BSN, RN Entered By: Regan Lemming on 03/17/2016 13:39:06 [name] ([id number]) -------------------------------------------------------------------------------- [place] Details Patient Name: [name] Date of Service: 03/17/2016 1:30 PM Medical Record  Number: [id number] Patient Account Number: [id number] Date of Birth/Sex: October 12, 1940 (76 y.o. Male) Treating RN: Afful, RN, BSN, Velva Harman Primary Care Jamari Diana: LADA, Rip Harbour Other Clinician: Referring Opal Mckellips: LADA, [place] Treating Judson Tsan/Extender: Frann Rider in Treatment: 1 Vital Signs Time Taken: 13:35 Temperature (F): 97.7 Height (in): 67 Pulse (bpm): 72 Weight (lbs): 128.1 Respiratory Rate (breaths/min): 16 Body Mass Index (BMI): 20.1 Blood Pressure (mmHg): 143/54 Reference Range: 80 - 120 mg / dl Electronic Signature(s) Signed: 03/17/2016 2:17:37 PM By: Regan Lemming BSN, RN Entered By: Regan Lemming on 03/17/2016 13:36:17

## 2016-03-23 ENCOUNTER — Encounter
Admission: RE | Admit: 2016-03-23 | Discharge: 2016-03-23 | Disposition: A | Discharge status: Home / Self Care | Payer: Medicare Other | Source: Ambulatory Visit | Attending: Oncology | Admitting: Oncology

## 2016-03-23 DIAGNOSIS — C787 Secondary malignant neoplasm of liver and intrahepatic bile duct: Secondary | ICD-10-CM

## 2016-03-23 DIAGNOSIS — C155 Malignant neoplasm of lower third of esophagus: Secondary | ICD-10-CM | POA: Diagnosis not present

## 2016-03-23 DIAGNOSIS — C159 Malignant neoplasm of esophagus, unspecified: Secondary | ICD-10-CM | POA: Diagnosis not present

## 2016-03-23 LAB — GLUCOSE, CAPILLARY: Glucose-Capillary: 100 mg/dL — ABNORMAL HIGH (ref 65–99)

## 2016-03-23 MED ORDER — FLUDEOXYGLUCOSE F - 18 (FDG) INJECTION
12.8300 | Freq: Once | INTRAVENOUS | Status: AC | PRN
  Administered 2016-03-23: 12.83 via INTRAVENOUS

## 2016-03-24 ENCOUNTER — Encounter: Payer: Medicare Other | Admitting: Surgery

## 2016-03-24 DIAGNOSIS — D649 Anemia, unspecified: Secondary | ICD-10-CM | POA: Diagnosis not present

## 2016-03-24 DIAGNOSIS — L89512 Pressure ulcer of right ankle, stage 2: Secondary | ICD-10-CM | POA: Diagnosis not present

## 2016-03-24 DIAGNOSIS — L97512 Non-pressure chronic ulcer of other part of right foot with fat layer exposed: Secondary | ICD-10-CM | POA: Diagnosis not present

## 2016-03-24 DIAGNOSIS — E441 Mild protein-calorie malnutrition: Secondary | ICD-10-CM | POA: Diagnosis not present

## 2016-03-24 DIAGNOSIS — C155 Malignant neoplasm of lower third of esophagus: Secondary | ICD-10-CM | POA: Diagnosis not present

## 2016-03-24 DIAGNOSIS — I10 Essential (primary) hypertension: Secondary | ICD-10-CM | POA: Diagnosis not present

## 2016-03-24 DIAGNOSIS — E11621 Type 2 diabetes mellitus with foot ulcer: Secondary | ICD-10-CM | POA: Diagnosis not present

## 2016-03-25 NOTE — Progress Notes (Signed)
[name], [name] ([id number]) Visit Report for 03/24/2016 Allergy List Details Patient Name: [name], [name]. Date of Service: 03/24/2016 10:30 AM Medical Record Number: [id number] Patient Account Number: [id number] Date of Birth/Sex: [birth year] (76 y.o. Male) Treating RN: Baruch Gouty, RN, BSN, Velva Harman Primary Care Fama Muenchow: LADA, Rip Harbour Other Clinician: Referring Drena Ham: LADA, [place] Treating Kyair Ditommaso/Extender: Frann Rider in Treatment: 2 Allergies Active Allergies sulfamethoxazole Cephalosporins lisinopril losartin Keflex amoxicillin ampicillin Allergy Notes Electronic Signature(s) Signed: 03/24/2016 11:04:22 AM By: Regan Lemming BSN, RN Entered By: Regan Lemming on 03/24/2016 11:04:22 [name] ([id number]) -------------------------------------------------------------------------------- [place] Details Patient Name: [name] Date of Service: 03/24/2016 10:30 AM Medical Record Number: [id number] Patient Account Number: [id number] Date of Birth/Sex: 1940-05-22 (76 y.o. Male) Treating RN: Afful, RN, BSN, [place] Primary Care Qais Jowers: LADA, [facility] Other Clinician: Referring Elsia Lasota: LADA, [place] Treating Kahiau Schewe/Extender: Frann Rider in Treatment: 2 Visit Information Patient Arrived: Ambulatory Arrival Time: 10:37 Accompanied By: WIFE Transfer Assistance: None Patient Identification Verified: Yes Secondary Verification Process Yes Completed: Patient Requires Transmission- No Based Precautions: Patient Has Alerts: Yes Patient Alerts: Patient on Blood Thinner DM II 81mg  aspirin twice daily History Since Last Visit All ordered tests and consults were completed: No Added or deleted any medications: No Any new allergies or adverse reactions: No Had a fall or experienced change in activities of daily living that may affect risk of falls: No Signs or symptoms of abuse/neglect since last visito No Hospitalized since last visit: No Has  Dressing in Place as Prescribed: Yes Electronic Signature(s) Signed: 03/24/2016 3:41:55 PM By: Regan Lemming BSN, RN Entered By: Regan Lemming on 03/24/2016 10:38:17 [name] ([id number]) -------------------------------------------------------------------------------- Encounter Discharge Information Details Patient Name: [name] Date of Service: 03/24/2016 10:30 AM Medical Record Number: [id number] Patient Account Number: [id number] Date of Birth/Sex: 26-May-1940 (76 y.o. Male) Treating RN: Baruch Gouty, RN, BSN, Velva Harman Primary Care Jaquelynn Wanamaker: LADA, Rip Harbour Other Clinician: Referring Arbor Leer: LADA, [place] Treating Mohamad Bruso/Extender: Frann Rider in Treatment: 2 Encounter Discharge Information Items Discharge Pain Level: 0 Discharge Condition: Stable Ambulatory Status: Ambulatory Discharge Destination: Home Transportation: Private Auto Accompanied By: wife Schedule Follow-up Appointment: No Medication Reconciliation completed and provided to Patient/Care No Charene Mccallister: Provided on Clinical Summary of Care: 03/24/2016 Form Type Recipient Paper Patient FW Electronic Signature(s) Signed: 03/24/2016 11:00:18 AM By: Ruthine Dose Entered By: Ruthine Dose on 03/24/2016 11:00:17 [name] ([id number]) -------------------------------------------------------------------------------- Lower Extremity Assessment Details Patient Name: [name] Date of Service: 03/24/2016 10:30 AM Medical Record Number: [id number] Patient Account Number: [id number] Date of Birth/Sex: [birth year] (76 y.o. Male) Treating RN: Baruch Gouty, RN, BSN, Velva Harman Primary Care Lizzie Cokley: LADA, Rip Harbour Other Clinician: Referring Rubert Frediani: LADA, [place] Treating Vestal Markin/Extender: Frann Rider in Treatment: 2 Edema Assessment Assessed: [Left: No] [Right: No] Edema: [Left: N] [Right: o] Vascular Assessment Claudication: Claudication Assessment [Right:None] Pulses: Dorsalis Pedis Palpable:  [Right:Yes] Posterior Tibial Extremity colors, hair growth, and conditions: Extremity Color: [Right:Normal] Temperature of Extremity: [Right:Warm] Capillary Refill: [Right:< 3 seconds] Toe Nail Assessment Left: Right: Thick: Yes Discolored: Yes Deformed: No Improper Length and Hygiene: No Electronic Signature(s) Signed: 03/24/2016 3:41:55 PM By: Regan Lemming BSN, RN Entered By: Regan Lemming on 03/24/2016 10:44:25 [name] ([id number]) -------------------------------------------------------------------------------- Multi Wound Chart Details Patient Name: [name] Date of Service: 03/24/2016 10:30 AM Medical Record Number: [id number] Patient Account Number: [id number] Date of Birth/Sex: Dec 14, 1940 (76 y.o. Male) Treating RN: Baruch Gouty, RN, BSN, Velva Harman Primary Care Khari Lett: LADA, Rip Harbour Other Clinician: Referring Young Brim: LADA, [place] Treating Yilia Sacca/Extender: Frann Rider in  Treatment: 2 Vital Signs Height(in): 67 Pulse(bpm): 66 Weight(lbs): 128.1 Blood Pressure 132/57 (mmHg): Body Mass Index(BMI): 20 Temperature(F): 98.2 Respiratory Rate 18 (breaths/min): Photos: [1:No Photos] [N/A:N/A] Wound Location: [1:Right Malleolus - Lateral] [N/A:N/A] Wounding Event: [1:Gradually Appeared] [N/A:N/A] Primary Etiology: [1:Diabetic Wound/Ulcer of the Lower Extremity] [N/A:N/A] Secondary Etiology: [1:Pressure Ulcer] [N/A:N/A] Comorbid History: [1:Cataracts, Chronic sinus problems/congestion, Anemia, Sleep Apnea, Hypertension, Myocardial Infarction, Type II Diabetes, Received Chemotherapy, Received Radiation] [N/A:N/A] Date Acquired: [1:01/09/2016] [N/A:N/A] Weeks of Treatment: [1:2] [N/A:N/A] Wound Status: [1:Open] [N/A:N/A] Measurements L x W x D 0.8x0.9x0.1 [N/A:N/A] (cm) Area (cm) : [1:0.565] [N/A:N/A] Volume (cm) : [1:0.057] [N/A:N/A] % Reduction in Area: [1:-139.40%] [N/A:N/A] % Reduction in Volume: -137.50% [N/A:N/A] Classification: [1:Grade 2]  [N/A:N/A] Exudate Amount: [1:Medium] [N/A:N/A] Exudate Type: [1:Serous] [N/A:N/A] Exudate Color: [1:amber] [N/A:N/A] Wound Margin: [1:Flat and Intact] [N/A:N/A] Granulation Amount: [1:None Present (0%)] [N/A:N/A] Necrotic Amount: [1:Large (67-100%)] [N/A:N/A] Exposed Structures: Fat Layer (Subcutaneous N/A N/A Tissue) Exposed: Yes Fascia: No Tendon: No Muscle: No Joint: No Bone: No Epithelialization: None N/A N/A Debridement: Debridement [id number] N/A N/A [id number]) Pre-procedure 10:50 N/A N/A Verification/Time Out Taken: Pain Control: Lidocaine 4% Topical N/A N/A Solution Tissue Debrided: Fibrin/Slough, N/A N/A Subcutaneous Level: Skin/Subcutaneous N/A N/A Tissue Debridement Area (sq 0.72 N/A N/A cm): Instrument: Curette N/A N/A Bleeding: Minimum N/A N/A Hemostasis Achieved: Pressure N/A N/A Procedural Pain: 0 N/A N/A Post Procedural Pain: 0 N/A N/A Debridement Treatment Procedure was tolerated N/A N/A Response: well Post Debridement 0.8x0.9x0.1 N/A N/A Measurements L x W x D (cm) Post Debridement 0.057 N/A N/A Volume: (cm) Periwound Skin Texture: No Abnormalities Noted N/A N/A Periwound Skin No Abnormalities Noted N/A N/A Moisture: Periwound Skin Color: Erythema: Yes N/A N/A Erythema Location: Circumferential N/A N/A Temperature: No Abnormality N/A N/A Tenderness on Yes N/A N/A Palpation: Wound Preparation: Ulcer Cleansing: N/A N/A Rinsed/Irrigated with Saline Topical Anesthetic Applied: Other: lidociane 4% Procedures Performed: Debridement N/A N/A [name], [name] ([id number]) Treatment Notes Wound #1 (Right, Lateral Malleolus) 1. Cleansed with: Clean wound with Normal Saline 4. Dressing Applied: Santyl Ointment 5. Secondary Dressing Applied Bordered Foam Dressing Dry Gauze Electronic Signature(s) Signed: 03/24/2016 11:08:30 AM By: Christin Fudge MD, FACS Entered By: Christin Fudge on 03/24/2016 11:08:30 [name]  ([id number]) -------------------------------------------------------------------------------- [place] Details Patient Name: [name], [name] Date of Service: 03/24/2016 10:30 AM Medical Record Number: [id number] Patient Account Number: [id number] Date of Birth/Sex: 1940-02-20 (76 y.o. Male) Treating RN: Baruch Gouty, RN, BSN, Velva Harman Primary Care Kriti Katayama: LADA, Rip Harbour Other Clinician: Referring Georgeanne Frankland: LADA, [place] Treating Nazyia Gaugh/Extender: Frann Rider in Treatment: 2 Active Inactive ` Orientation to the Wound Care Program Nursing Diagnoses: Knowledge deficit related to the wound healing center program Goals: Patient/caregiver will verbalize understanding of the [place] Program Date Initiated: 03/10/2016 Target Resolution Date: 03/23/2016 Goal Status: Active Interventions: Provide education on orientation to the wound center Notes: ` Soft Tissue Infection Nursing Diagnoses: Impaired tissue integrity Potential for infection: soft tissue Goals: Patient will remain free of wound infection Date Initiated: 03/10/2016 Target Resolution Date: 03/17/2016 Goal Status: Active Interventions: Assess signs and symptoms of infection every visit Notes: ` Wound/Skin Impairment Nursing Diagnoses: Impaired tissue integrity [name], [name] ([id number]) Goals: Ulcer/skin breakdown will heal within 14 weeks Date Initiated: 03/10/2016 Target Resolution Date: 03/24/2016 Goal Status: Active Interventions: Assess patient/caregiver ability to obtain necessary supplies Notes: Electronic Signature(s) Signed: 03/24/2016 3:41:55 PM By: Regan Lemming BSN, RN Entered By: Regan Lemming on 03/24/2016 10:44:28 [name] ([id number]) -------------------------------------------------------------------------------- Pain Assessment Details Patient Name: Jeremiah Fabian  G. Date of Service: 03/24/2016 10:30 AM Medical Record Number: [id number] Patient Account Number:  [id number] Date of Birth/Sex: 22-Apr-1940 (76 y.o. Male) Treating RN: Baruch Gouty, RN, BSN, Velva Harman Primary Care Olusegun Gerstenberger: LADA, Rip Harbour Other Clinician: Referring Augusten Lipkin: LADA, [place] Treating Carlosdaniel Grob/Extender: Frann Rider in Treatment: 2 Active Problems Location of Pain Severity and Description of Pain Patient Has Paino No Site Locations With Dressing Change: No Pain Management and Medication Current Pain Management: Electronic Signature(s) Signed: 03/24/2016 3:41:55 PM By: Regan Lemming BSN, RN Entered By: Regan Lemming on 03/24/2016 10:38:31 [name] ([id number]) -------------------------------------------------------------------------------- Patient/Caregiver Education Details Patient Name: [name] Date of Service: 03/24/2016 10:30 AM Medical Record Number: [id number] Patient Account Number: [id number] Date of Birth/Gender: [birth year] (76 y.o. Male) Treating RN: Baruch Gouty, RN, BSN, Velva Harman Primary Care Physician: LADA, Rip Harbour Other Clinician: Referring Physician: LADA, [place] Treating Physician/Extender: Frann Rider in Treatment: 2 Education Assessment Education Provided To: Patient Education Topics Provided Welcome To The [place]: Methods: Explain/Verbal Responses: State content correctly Wound Debridement: Methods: Explain/Verbal Responses: State content correctly Wound/Skin Impairment: Methods: Explain/Verbal Responses: State content correctly Electronic Signature(s) Signed: 03/24/2016 3:41:55 PM By: Regan Lemming BSN, RN Entered By: Regan Lemming on 03/24/2016 10:53:26 [name] ([id number]) -------------------------------------------------------------------------------- Wound Assessment Details Patient Name: [name] Date of Service: 03/24/2016 10:30 AM Medical Record Number: [id number] Patient Account Number: [id number] Date of Birth/Sex: 1940-11-08 (76 y.o. Male) Treating RN: Afful, RN, BSN, [place] Primary Care Reet Scharrer: LADA,  Rip Harbour Other Clinician: Referring Zayn Selley: LADA, [place] Treating Mikalah Skyles/Extender: Frann Rider in Treatment: 2 Wound Status Wound Number: 1 Primary Diabetic Wound/Ulcer of the Lower Etiology: Extremity Wound Location: Right Malleolus - Lateral Secondary Pressure Ulcer Wounding Event: Gradually Appeared Etiology: Date Acquired: 01/09/2016 Wound Open Weeks Of Treatment: 2 Status: Clustered Wound: No Comorbid Cataracts, Chronic sinus History: problems/congestion, Anemia, Sleep Apnea, Hypertension, Myocardial Infarction, Type II Diabetes, Received Chemotherapy, Received Radiation Photos Photo Uploaded By: Regan Lemming on 03/24/2016 14:04:00 Wound Measurements Length: (cm) 0.8 Width: (cm) 0.9 Depth: (cm) 0.1 Area: (cm) 0.565 Volume: (cm) 0.057 % Reduction in Area: -139.4% % Reduction in Volume: -137.5% Epithelialization: None Tunneling: No Undermining: No Wound Description Classification: Grade 2 Wound Margin: Flat and Intact Exudate Amount: Medium Exudate Type: Serous Exudate Color: amber Foul Odor After Cleansing: No Slough/Fibrino Yes Wound Bed MC, FECKO ([id number]) Granulation Amount: None Present (0%) Exposed Structure Necrotic Amount: Large (67-100%) Fascia Exposed: No Necrotic Quality: Adherent Slough Fat Layer (Subcutaneous Tissue) Exposed: Yes Tendon Exposed: No Muscle Exposed: No Joint Exposed: No Bone Exposed: No Periwound Skin Texture Texture Color No Abnormalities Noted: No No Abnormalities Noted: No Erythema: Yes Moisture Erythema Location: Circumferential No Abnormalities Noted: No Temperature / Pain Temperature: No Abnormality Tenderness on Palpation: Yes Wound Preparation Ulcer Cleansing: Rinsed/Irrigated with Saline Topical Anesthetic Applied: Other: lidociane 4%, Treatment Notes Wound #1 (Right, Lateral Malleolus) 1. Cleansed with: Clean wound with Normal Saline 4. Dressing Applied: Santyl Ointment 5.  Secondary Dressing Applied Bordered Foam Dressing Dry Gauze Electronic Signature(s) Signed: 03/24/2016 3:41:55 PM By: Regan Lemming BSN, RN Entered By: Regan Lemming on 03/24/2016 10:44:05 [name] ([id number]) -------------------------------------------------------------------------------- [place] Details Patient Name: [name] Date of Service: 03/24/2016 10:30 AM Medical Record Number: [id number] Patient Account Number: [id number] Date of Birth/Sex: 1940/03/30 (76 y.o. Male) Treating RN: Baruch Gouty, RN, BSN, Velva Harman Primary Care Ewing Fandino: LADA, Rip Harbour Other Clinician: Referring Ronie Fleeger: LADA, [place] Treating Quatisha Zylka/Extender: Frann Rider in Treatment: 2 Vital Signs Time Taken: 10:38 Temperature (F): 98.2 Height (in): 67 Pulse (  bpm): 66 Weight (lbs): 128.1 Respiratory Rate (breaths/min): 18 Body Mass Index (BMI): 20.1 Blood Pressure (mmHg): 132/57 Reference Range: 80 - 120 mg / dl Electronic Signature(s) Signed: 03/24/2016 3:41:55 PM By: Regan Lemming BSN, RN Entered By: Regan Lemming on 03/24/2016 10:41:10

## 2016-03-25 NOTE — Progress Notes (Signed)
[name] ([id number]) Visit Report for 03/24/2016 Chief Complaint Document Details Patient Name: [name], [name]. Date of Service: 03/24/2016 10:30 AM Medical Record Number: [id number] Patient Account Number: [id number] Date of Birth/Sex: [birth year] (76 y.o. Male) Treating RN: Baruch Gouty, RN, BSN, Velva Harman Primary Care Provider: LADA, Rip Harbour Other Clinician: Referring Provider: LADA, [place] Treating Provider/Extender: Frann Rider in Treatment: 2 Information Obtained from: Patient Chief Complaint Patient is at the clinic for treatment of an open pressure ulcer to the right lateral ankle which she's had for about 2 months Electronic Signature(s) Signed: 03/24/2016 11:08:50 AM By: Christin Fudge MD, FACS Entered By: Christin Fudge on 03/24/2016 11:08:50 [name] ([id number]) -------------------------------------------------------------------------------- Debridement Details Patient Name: [name] Date of Service: 03/24/2016 10:30 AM Medical Record Number: [id number] Patient Account Number: [id number] Date of Birth/Sex: 05-12-1940 (76 y.o. Male) Treating RN: Afful, RN, BSN, [place] Primary Care Provider: LADA, Rip Harbour Other Clinician: Referring Provider: LADA, [place] Treating Provider/Extender: Frann Rider in Treatment: 2 Debridement Performed for Wound #1 Right,Lateral Malleolus Assessment: Performed By: Physician Christin Fudge, MD Debridement: Debridement Pre-procedure Yes - 10:50 Verification/Time Out Taken: Start Time: 10:50 Pain Control: Lidocaine 4% Topical Solution Level: Skin/Subcutaneous Tissue Total Area Debrided (L x 0.8 (cm) x 0.9 (cm) = 0.72 (cm) W): Tissue and other Non-Viable, Fibrin/Slough, Subcutaneous material debrided: Instrument: Curette Bleeding: Minimum Hemostasis Achieved: Pressure End Time: 10:52 Procedural Pain: 0 Post Procedural Pain: 0 Response to Treatment: Procedure was tolerated well Post Debridement Measurements of  Total Wound Length: (cm) 0.8 Width: (cm) 0.9 Depth: (cm) 0.1 Volume: (cm) 0.057 Character of Wound/Ulcer Post Requires Further Debridement Debridement: Severity of Tissue Post Debridement: Fat layer exposed Post Procedure Diagnosis Same as Pre-procedure Electronic Signature(s) Signed: 03/24/2016 11:08:44 AM By: Christin Fudge MD, FACS Signed: 03/24/2016 3:41:55 PM By: Regan Lemming BSN, RN Entered By: Christin Fudge on 03/24/2016 11:08:44 [name] ([id number], [name] ([id number]) -------------------------------------------------------------------------------- HPI Details Patient Name: [name]. Date of Service: 03/24/2016 10:30 AM Medical Record Number: [id number] Patient Account Number: [id number] Date of Birth/Sex: 08-01-1940 (76 y.o. Male) Treating RN: Baruch Gouty, RN, BSN, Velva Harman Primary Care Provider: LADA, Rip Harbour Other Clinician: Referring Provider: LADA, [place] Treating Provider/Extender: Frann Rider in Treatment: 2 History of Present Illness Location: Patient presents with an ulcer on the right lateral ankle. Quality: Patient reports experiencing a dull pain to affected area(s). Severity: Patient states wound are getting worse. Duration: Patient has had the wound for > 2 months prior to seeking treatment at the wound center Timing: Pain in wound is Intermittent (comes and goes Context: The wound appeared gradually over time Modifying Factors: Other treatment(s) tried include:local care as per the medical oncologist Associated Signs and Symptoms: Patient reports having:some pain and no discharge HPI Description: 76 year old patient has been referred by his medical oncologist Dr. Grayland Ormond, for a right lateral malleolus ulcer has been there for several weeks. 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 She is currently on chemotherapy with FOLFOX and Herceptin. past medical history significant for  diabetes mellitus, anemia, hypertension, sleep apnea, status post appendectomy, peripheral vascular catheterization( Port placement) in September 2017 by Dr. Delana Meyer.he is also receiving palliative radiation therapy and was seen by Dr. Donella Stade. 03/17/2016 -- - x-ray of the right ankle -- IMPRESSION: 1. Soft tissue wound noted over the lateral malleolus, no underlying bony abnormality. No acute bony abnormality. 2. Diffuse degenerative change. 3. Peripheral vascular disease. Electronic Signature(s) Signed: 03/24/2016 11:09:10 AM By:  Christin Fudge MD, FACS Entered By: Christin Fudge on 03/24/2016 11:09:10 [name] ([id number]) -------------------------------------------------------------------------------- Physical Exam Details Patient Name: [name], [name] Date of Service: 03/24/2016 10:30 AM Medical Record Number: [id number] Patient Account Number: [id number] Date of Birth/Sex: 22-Nov-1940 (76 y.o. Male) Treating RN: Baruch Gouty, RN, BSN, Velva Harman Primary Care Provider: LADA, Rip Harbour Other Clinician: Referring Provider: LADA, [place] Treating Provider/Extender: Frann Rider in Treatment: 2 Constitutional . Pulse regular. Respirations normal and unlabored. Afebrile. . Eyes Nonicteric. Reactive to light. Ears, Nose, Mouth, and Throat Lips, teeth, and gums WNL.Marland Kitchen Moist mucosa without lesions. Neck supple and nontender. No palpable supraclavicular or cervical adenopathy. Normal sized without goiter. Respiratory WNL. No retractions.. Cardiovascular Pedal Pulses WNL. No clubbing, cyanosis or edema. Chest Breasts symmetical and no nipple discharge.. Breast tissue WNL, no masses, lumps, or tenderness.. Gastrointestinal (GI) Abdomen without masses or tenderness.. No liver or spleen enlargement or tenderness.. Genitourinary (GU) No hydrocele, spermatocele, tenderness of the cord, or testicular mass.Marland Kitchen Penis without lesions.[name] without lesions. No cystocele, or rectocele. Pelvic  support intact, no discharge.Marland Kitchen Urethra without masses, tenderness or scarring.Marland Kitchen Lymphatic No adneopathy. No adenopathy. No adenopathy. Musculoskeletal Adexa without tenderness or enlargement.. Digits and nails w/o clubbing, cyanosis, infection, petechiae, ischemia, or inflammatory conditions.. Integumentary (Hair, Skin) No suspicious lesions. No crepitus or fluctuance. No peri-wound warmth or erythema. No masses.Marland Kitchen Psychiatric Judgement and insight Intact.. No evidence of depression, anxiety, or agitation.. Notes there is subcutaneous debris which was removed with a #3 curet and it is not down to bleeding healthy granulation tissue Electronic Signature(s) SHERYL, JUNG ([id number]) Signed: 03/24/2016 11:09:38 AM By: Christin Fudge MD, FACS Entered By: Christin Fudge on 03/24/2016 11:09:38 [name] ([id number]) -------------------------------------------------------------------------------- Physician Orders Details Patient Name: [name] Date of Service: 03/24/2016 10:30 AM Medical Record Number: [id number] Patient Account Number: [id number] Date of Birth/Sex: Feb 07, 1941 (76 y.o. Male) Treating RN: Baruch Gouty, RN, BSN, Velva Harman Primary Care Provider: LADA, Rip Harbour Other Clinician: Referring Provider: LADA, [place] Treating Provider/Extender: Frann Rider in Treatment: 2 Verbal / Phone Orders: No Diagnosis Coding Wound Cleansing Wound #1 Right,Lateral Malleolus o Clean wound with Normal Saline. o Cleanse wound with mild soap and water Anesthetic Wound #1 Right,Lateral Malleolus o Topical Lidocaine 4% cream applied to wound bed prior to debridement Primary Wound Dressing Wound #1 Right,Lateral Malleolus o Santyl Ointment - Or Manuka Honey Secondary Dressing Wound #1 Right,Lateral Malleolus o Dry Gauze o Boardered Foam Dressing Dressing Change Frequency Wound #1 Right,Lateral Malleolus o Change dressing every day. Follow-up Appointments Wound #1  Right,Lateral Malleolus o Return Appointment in 1 week. Medications-please add to medication list. Wound #1 Right,Lateral Malleolus o Other: - Include these in your diet...VITAMIN A, C, ZINC, MVI Electronic Signature(s) Signed: 03/24/2016 3:35:58 PM By: Christin Fudge MD, FACS Signed: 03/24/2016 3:41:55 PM By: Regan Lemming BSN, RN [name] ([id number]) Entered By: Regan Lemming on 03/24/2016 10:52:30 [name] ([id number]) -------------------------------------------------------------------------------- Problem List Details Patient Name: [name], [name] Date of Service: 03/24/2016 10:30 AM Medical Record Number: [id number] Patient Account Number: [id number] Date of Birth/Sex: Jan 06, 1941 (76 y.o. Male) Treating RN: Baruch Gouty, RN, BSN, Velva Harman Primary Care Provider: LADA, Rip Harbour Other Clinician: Referring Provider: LADA, [place] Treating Provider/Extender: Frann Rider in Treatment: 2 Active Problems ICD-10 Encounter Code Description Active Date Diagnosis E11.621 Type 2 diabetes mellitus with foot ulcer 03/10/2016 Yes L89.512 Pressure ulcer of right ankle, stage 2 03/10/2016 Yes 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 03/10/2016 Yes C15.5 Malignant neoplasm of lower third of esophagus 03/10/2016 Yes Inactive Problems  Resolved Problems Electronic Signature(s) Signed: 03/24/2016 11:08:26 AM By: Christin Fudge MD, FACS Entered By: Christin Fudge on 03/24/2016 11:08:26 [name] ([id number]) -------------------------------------------------------------------------------- Progress Note Details Patient Name: [name] Date of Service: 03/24/2016 10:30 AM Medical Record Number: [id number] Patient Account Number: [id number] Date of Birth/Sex: 1940/09/03 (76 y.o. Male) Treating RN: Baruch Gouty, RN, BSN, Velva Harman Primary Care Provider: LADA, Rip Harbour Other Clinician: Referring Provider: LADA, [place] Treating Provider/Extender: Frann Rider in Treatment: 2 Subjective Chief  Complaint Information obtained from Patient Patient is at the clinic for treatment of an open pressure ulcer to the right lateral ankle which she's had for about 2 months History of Present Illness (HPI) The following HPI elements were documented for the patient's wound: Location: Patient presents with an ulcer on the right lateral ankle. Quality: Patient reports experiencing a dull pain to affected area(s). Severity: Patient states wound are getting worse. Duration: Patient has had the wound for > 2 months prior to seeking treatment at the wound center Timing: Pain in wound is Intermittent (comes and goes Context: The wound appeared gradually over time Modifying Factors: Other treatment(s) tried include:local care as per the medical oncologist Associated Signs and Symptoms: Patient reports having:some pain and no discharge 76 year old patient has been referred by his medical oncologist Dr. Grayland Ormond, for a right lateral malleolus ulcer has been there for several weeks. 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 She is currently on chemotherapy with FOLFOX and Herceptin. past medical history significant for diabetes mellitus, anemia, hypertension, sleep apnea, status post appendectomy, peripheral vascular catheterization( Port placement) in September 2017 by Dr. Delana Meyer.he is also receiving palliative radiation therapy and was seen by Dr. Donella Stade. 03/17/2016 -- - x-ray of the right ankle -- IMPRESSION: 1. Soft tissue wound noted over the lateral malleolus, no underlying bony abnormality. No acute bony abnormality. 2. Diffuse degenerative change. 3. Peripheral vascular disease. Allergies sulfamethoxazole, Cephalosporins, lisinopril, losartin, Keflex, amoxicillin, ampicillin Objective [name], [name] ([id number]) Constitutional Pulse regular. Respirations normal and unlabored. Afebrile. Vitals Time Taken: 10:38 AM, Height: 67 in,  Weight: 128.1 lbs, BMI: 20.1, Temperature: 98.2 F, Pulse: 66 bpm, Respiratory Rate: 18 breaths/min, Blood Pressure: 132/57 mmHg. Eyes Nonicteric. Reactive to light. Ears, Nose, Mouth, and Throat Lips, teeth, and gums WNL.Marland Kitchen Moist mucosa without lesions. Neck supple and nontender. No palpable supraclavicular or cervical adenopathy. Normal sized without goiter. Respiratory WNL. No retractions.. Cardiovascular Pedal Pulses WNL. No clubbing, cyanosis or edema. Chest Breasts symmetical and no nipple discharge.. Breast tissue WNL, no masses, lumps, or tenderness.. Gastrointestinal (GI) Abdomen without masses or tenderness.. No liver or spleen enlargement or tenderness.. Genitourinary (GU) No hydrocele, spermatocele, tenderness of the cord, or testicular mass.Marland Kitchen Penis without lesions.[name] without lesions. No cystocele, or rectocele. Pelvic support intact, no discharge.Marland Kitchen Urethra without masses, tenderness or scarring.Marland Kitchen Lymphatic No adneopathy. No adenopathy. No adenopathy. Musculoskeletal Adexa without tenderness or enlargement.. Digits and nails w/o clubbing, cyanosis, infection, petechiae, ischemia, or inflammatory conditions.Marland Kitchen Psychiatric Judgement and insight Intact.. No evidence of depression, anxiety, or agitation.. General Notes: there is subcutaneous debris which was removed with a #3 curet and it is not down to bleeding healthy granulation tissue Integumentary (Hair, Skin) No suspicious lesions. No crepitus or fluctuance. No peri-wound warmth or erythema. No masses.[name], [name] ([id number]) Wound #1 status is Open. Original cause of wound was Gradually Appeared. The wound is located on the Right,Lateral Malleolus. The wound measures 0.8cm length x 0.9cm width x 0.1cm  depth; 0.565cm^2 area and 0.057cm^3 volume. There is Fat Layer (Subcutaneous Tissue) Exposed exposed. There is no tunneling or undermining noted. There is a medium amount of serous drainage noted. The  wound margin is flat and intact. There is no granulation within the wound bed. There is a large (67-100%) amount of necrotic tissue within the wound bed including Adherent Slough. The periwound skin appearance exhibited: Erythema. The surrounding wound skin color is noted with erythema which is circumferential. Periwound temperature was noted as No Abnormality. The periwound has tenderness on palpation. Assessment Active Problems ICD-10 E11.621 - Type 2 diabetes mellitus with foot ulcer L89.512 - Pressure ulcer of right ankle, stage 2 E44.1 - Mild protein-calorie malnutrition C15.5 - Malignant neoplasm of lower third of esophagus Procedures Wound #1 Wound #1 is a Diabetic Wound/Ulcer of the Lower Extremity located on the Right,Lateral Malleolus . There was a Skin/Subcutaneous Tissue Debridement [id number]) debridement with total area of 0.72 sq cm performed by Christin Fudge, MD. with the following instrument(s): Curette to remove Non-Viable tissue/material including Fibrin/Slough and Subcutaneous after achieving pain control using Lidocaine 4% Topical Solution. A time out was conducted at 10:50, prior to the start of the procedure. A Minimum amount of bleeding was controlled with Pressure. The procedure was tolerated well with a pain level of 0 throughout and a pain level of 0 following the procedure. Post Debridement Measurements: 0.8cm length x 0.9cm width x 0.1cm depth; 0.057cm^3 volume. Character of Wound/Ulcer Post Debridement requires further debridement. Severity of Tissue Post Debridement is: Fat layer exposed. Post procedure Diagnosis Wound #1: Same as Pre-Procedure Plan [name], [name] ([id number]) Wound Cleansing: Wound #1 Right,Lateral Malleolus: Clean wound with Normal Saline. Cleanse wound with mild soap and water Anesthetic: Wound #1 Right,Lateral Malleolus: Topical Lidocaine 4% cream applied to wound bed prior to debridement Primary Wound Dressing: Wound #1  Right,Lateral Malleolus: Santyl Ointment - Or Manuka Honey Secondary Dressing: Wound #1 Right,Lateral Malleolus: Dry Gauze Boardered Foam Dressing Dressing Change Frequency: Wound #1 Right,Lateral Malleolus: Change dressing every day. Follow-up Appointments: Wound #1 Right,Lateral Malleolus: Return Appointment in 1 week. Medications-please add to medication list.: Wound #1 Right,Lateral Malleolus: Other: - Include these in your diet...VITAMIN A, C, ZINC, MVI I have asked them to follow the following recommendations: 1. Daily dressing with Medihoney to be used (Santyl ointment was not affordable) . 2. x-ray of the right ankle -- x-ray results discussed with them 3. Offloading has been discussed as much as possible in great detail 4. Adequate protein, vitamin A, vitamin C and zinc. 5. regular visits to the wound center the patient and his wife have read all questions answered and will be compliant Electronic Signature(s) Signed: 03/24/2016 11:10:15 AM By: Christin Fudge MD, FACS Entered By: Christin Fudge on 03/24/2016 11:10:15 [name] ([id number]) -------------------------------------------------------------------------------- SuperBill Details Patient Name: [name] Date of Service: 03/24/2016 Medical Record Number: [id number] Patient Account Number: [id number] Date of Birth/Sex: [birth year] (76 y.o. Male) Treating RN: Baruch Gouty, RN, BSN, Velva Harman Primary Care Provider: LADA, Rip Harbour Other Clinician: Referring Provider: LADA, [place] Treating Provider/Extender: Christin Fudge Service Line: Outpatient Weeks in Treatment: 2 Diagnosis Coding ICD-10 Codes Code Description E11.621 Type 2 diabetes mellitus with foot ulcer L89.512 Pressure ulcer of right ankle, stage 2 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 C15.5 Malignant neoplasm of lower third of esophagus Facility Procedures CPT4 Code: [id number] Description: [id number] - DEB SUBQ TISSUE 20 SQ CM/< ICD-10 Description Diagnosis  E11.621 Type 2 diabetes mellitus with foot ulcer L89.512 Pressure ulcer of right ankle, stage 2  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 C15.5 Malignant neoplasm of lower third  of esophagus Modifier: Quantity: 1 Physician Procedures CPT4 Code: [id number] Description: [id number] - WC PHYS SUBQ TISS 20 SQ CM ICD-10 Description Diagnosis E11.621 Type 2 diabetes mellitus with foot ulcer L89.512 Pressure ulcer of right ankle, stage 2 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 C15.5 Malignant neoplasm of lower third  of esophagus Modifier: Quantity: 1 Electronic Signature(s) Signed: 03/24/2016 11:11:26 AM By: Christin Fudge MD, FACS Entered By: Christin Fudge on 03/24/2016 11:11:26

## 2016-03-26 NOTE — Progress Notes (Signed)
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  INTERVAL HISTORY:  Patient returns to clinic today for further evaluation, discussion of his imaging results, and consideration of cycle 9 of FOLFOX plus Herceptin. He continues to have a good appetite and gain weight. He does not complain of weakness or fatigue today. The painful ulcer on his right ankle is improving since referral to wound care. He reports improved swallowing. He has no neurologic complaints. He denies any fevers. He denies any chest pain or shortness of breath. He denies any nausea, vomiting, constipation, or diarrhea. He denies any melena or hematochezia. Patient offers no further specific complaints.  REVIEW OF SYSTEMS:   Review of Systems  Constitutional: Negative for fever, malaise/fatigue and weight loss.  Respiratory: Negative.  Negative for cough and shortness of breath.   Cardiovascular: Negative.  Negative for chest pain.  Gastrointestinal: Negative for abdominal pain, blood in stool, melena and vomiting.  Genitourinary: Negative.   Musculoskeletal: Negative.   Skin: Negative.   Neurological: Negative for weakness.  Psychiatric/Behavioral: Negative.  The patient is not nervous/anxious.     As per HPI. Otherwise, a complete review of systems is negative.  PAST MEDICAL HISTORY: Past Medical History:  Diagnosis Date  . Anemia   . Arthritis   . 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 Diabetes mellitus without complication ([place])     . Ectatic abdominal aorta ([place]) 08/12/2015  . Esophageal cancer ([place])   . GI bleed   . Hearing loss    left ear  . Hypertension   . Hypothyroidism   . Liver mass   . Mild left atrial enlargement 08/12/2015   Very mild; LA 4.2 cm; echo March 2017  . Mitral valvular regurgitation 08/12/2015  . Sleep apnea   . Stroke ([place])   . TIA (transient ischemic attack)   . 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 APPENDECTOMY    . EUS N/A 10/07/2015   Procedure: FULL UPPER ENDOSCOPIC ULTRASOUND (EUS) RADIAL;  Surgeon: Holly Bodily, MD;  Location: [facility] ENDOSCOPY;  Service: Endoscopy;  Laterality: N/A;  . EYE SURGERY    . FRACTURE SURGERY    . NOSE SURGERY    . PERIPHERAL VASCULAR CATHETERIZATION N/A 10/26/2015   Procedure: Glori Luis Cath Insertion;  Surgeon: Katha Cabal, MD;  Location: [facility];  Service: Cardiovascular;  Laterality: N/A;    FAMILY HISTORY Family History  Problem Relation Age of Onset  . Hypertension Mother   . Lung cancer Father   . Hypertension Brother        ADVANCED DIRECTIVES:    HEALTH MAINTENANCE: Social History  Substance Use Topics  . Smoking status: Former Smoker    Packs/day: 1.00    Types: Cigarettes    Quit date: 10/03/1983  . Smokeless tobacco: Never Used  . Alcohol use 0.0 oz/week     Comment: rarely     Colonoscopy:  PAP:  Bone density:  Lipid panel:  Allergies  Allergen  Reactions  . Sulfa Antibiotics Itching and Hives  . Amoxicillin Hives  . Ampicillin Hives  . Keflex [Cephalexin] Itching  . Lisinopril Other (See Comments)  . Losartan Other (See Comments)  . Sulfamethoxazole-Trimethoprim Other (See Comments)  . Cephalosporins Hives    Current Outpatient Prescriptions  Medication Sig Dispense Refill  . aspirin EC 81 MG tablet Take 81 mg by mouth 2 (two) times daily.     Marland Kitchen BIOTIN PO Take by mouth.    .  Cholecalciferol (VITAMIN D3) 1000 UNITS CAPS Take 2,000 Units by mouth daily.     . Coenzyme Q10 (CO Q-10) 200 MG CAPS Take 200 mg by mouth daily.     . isosorbide mononitrate (IMDUR) 60 MG 24 hr tablet Take 1 tablet (60 mg total) by mouth daily. 30 tablet 0  . levothyroxine (SYNTHROID, LEVOTHROID) 100 MCG tablet Take 100 mcg by mouth every morning.    . lidocaine-prilocaine (EMLA) cream Apply 1 application topically as needed. Apply to port 1-2 hours prior to chemotherapy. Cover with plastic wrap. 30 g 2  . Lutein 40 MG CAPS Take 40 mg by mouth daily.     . Magnesium Oxide 250 MG TABS Take 1 tablet (250 mg total) by mouth daily.  0  . metFORMIN (GLUCOPHAGE) 1000 MG tablet Take 2,000 mg by mouth daily.    . metoprolol tartrate (LOPRESSOR) 25 MG tablet Take 12.5 mg by mouth 2 (two) times daily.     . Misc Natural Products (BEE PROPOLIS PO) Take 1 tablet by mouth 2 (two) times daily.     . Multiple Vitamins-Minerals (MULTI FOR HIM 50+ PO) Take 1 tablet by mouth daily.    . ondansetron (ZOFRAN) 8 MG tablet Take 1 tablet (8 mg total) by mouth every 8 (eight) hours as needed for nausea or vomiting. 30 tablet 2  . prochlorperazine (COMPAZINE) 10 MG tablet Take 1 tablet (10 mg total) by mouth every 6 (six) hours as needed for nausea or vomiting. 30 tablet 2  . Red Yeast Rice 600 MG TABS Take 2 tablets by mouth at bedtime.    . sucralfate (CARAFATE) 1 g tablet Take 1 tablet (1 g total) by mouth 3 (three) times daily. Dissolve each tablet in 2-3 tbsp warm water, swish and swallow. 90 tablet 2  . Wound Dressings (MEDIHONEY WOUND/BURN DRESSING) PSTE   0   No current facility-administered medications for this visit.    Facility-Administered Medications Ordered in Other Visits  Medication Dose Route Frequency Provider Last Rate Last Dose  . 0.9 %  sodium chloride infusion   Intravenous Continuous Lloyd Huger, MD        OBJECTIVE: Vitals:   03/27/16 1003  BP: 134/73  Pulse: 67  Resp: 18  Temp:  97.2 F (36.2 C)     Body mass index is 20.41 kg/m.    ECOG FS:1 - Symptomatic but completely ambulatory  General: Well-developed, well-nourished, no acute distress. Eyes: Pink conjunctiva, anicteric sclera. Lungs: Clear to auscultation bilaterally. Heart: Regular rate and rhythm. No rubs, murmurs, or gallops. Abdomen: Soft, nontender, nondistended. No organomegaly noted, normoactive bowel sounds. Musculoskeletal: No edema, cyanosis, or clubbing. Neuro: Alert, answering all questions appropriately. Cranial nerves grossly intact. Skin: No rashes or petechiae noted. Subcentimeter open ulcer and right lateral malleolus. Psych: Normal affect.   LAB RESULTS:  Lab Results  Component Value Date   NA 132 (L) 03/27/2016   K 4.1 03/27/2016   CL 97 (L) 03/27/2016   CO2 26 03/27/2016  GLUCOSE 174 (H) 03/27/2016   BUN 12 03/27/2016   CREATININE 0.57 (L) 03/27/2016   CALCIUM 9.1 03/27/2016   PROT 7.2 03/27/2016   ALBUMIN 3.9 03/27/2016   AST 35 03/27/2016   ALT 20 03/27/2016   ALKPHOS 84 03/27/2016   BILITOT 0.5 03/27/2016   GFRNONAA >60 03/27/2016   GFRAA >60 03/27/2016    Lab Results  Component Value Date   WBC 17.3 (H) 03/27/2016   NEUTROABS 15.5 (H) 03/27/2016   HGB 10.2 (L) 03/27/2016   HCT 30.5 (L) 03/27/2016   MCV 92.7 03/27/2016   PLT 307 03/27/2016   Lab Results  Component Value Date   IRON 31 (L) 12/22/2015   TIBC 400 12/22/2015   IRONPCTSAT 8 (L) 12/22/2015    Lab Results  Component Value Date   FERRITIN 32 12/22/2015   Lab Results  Component Value Date   CA199 25,156 (H) 03/27/2016   Lab Results  Component Value Date   CEA 372.7 (H) 03/27/2016     STUDIES: Dg Ankle Complete Right  Result Date: 03/10/2016 CLINICAL DATA:  Nonhealing wound. EXAM: RIGHT ANKLE - COMPLETE 3+ VIEW COMPARISON:  No recent. FINDINGS: Soft tissue ulceration noted over the lateral malleolus. No underlying bony abnormality. No acute bony or joint abnormality identified. No  evidence of fracture or dislocation. Degenerative change right ankle. Peripheral vascular calcification. IMPRESSION: 1. Soft tissue wound noted over the lateral malleolus, no underlying bony abnormality. No acute bony abnormality. 2.  Diffuse degenerative change. 3. Peripheral vascular disease. Electronically Signed   By: Marcello Moores  Register   On: 03/10/2016 14:44   Nm Cardiac Muga Rest  Result Date: 02/28/2016 CLINICAL DATA:  Pre chemotherapy evaluation. EXAM: NUCLEAR MEDICINE CARDIAC BLOOD POOL IMAGING (MUGA) TECHNIQUE: Cardiac multi-gated acquisition was performed at rest following intravenous injection of Tc-21mlabeled red blood cells. RADIOPHARMACEUTICALS:  21.9 mCi Tc-952mDP in-vitro labeled red blood cells IV COMPARISON:  11/17/2015 FINDINGS: The left ventricular ejection fraction is equal to 52.8%. On the previous exam the left ventricular ejection fraction was equal to 52%. No significant wall motion abnormality identified. IMPRESSION: 1. Stable left ventricular ejection fraction equal to 52.8%. Electronically Signed   By: TaKerby Moors.D.   On: 02/28/2016 14:46   Nm Pet Image Restag (ps) Skull Base To Thigh  Result Date: 03/23/2016 CLINICAL DATA:  Subsequent treatment strategy for esophageal carcinoma with liver metastasis. Rising tumor markers. EXAM: NUCLEAR MEDICINE PET SKULL BASE TO THIGH TECHNIQUE: 12.8 mCi F-18 FDG was injected intravenously. Full-ring PET imaging was performed from the skull base to thigh after the radiotracer. CT data was obtained and used for attenuation correction and anatomic localization. FASTING BLOOD GLUCOSE:  Value: 84 mg/dl COMPARISON:  02/02/2016 FINDINGS: NECK No hypermetabolic lymph nodes in the neck. CHEST FDG uptake persists in the lower thoracic esophagus, with SUV measuring 5.5 compared to 5.2 previously. 1.4 cm right paratracheal lymph node is stable in size, and has SUV max of 3.9 on today's study compared to 3.1 previously. No new hypermetabolic  lymphadenopathy identified. Bilateral upper lobe prominent sub-cm pulmonary nodules show no significant change in size or number compared to previous study. Index nodule in peripheral right upper lobe measures 6 mm on image 87/3 and has SUV max of 1.3. These findings show no significant change compared to previous study. Mild emphysema again noted. No evidence of pleural effusion. Aortic and coronary artery atherosclerosis. ABDOMEN/PELVIS Hypermetabolic lesion in the anterior right hepatic lobe has SUV max of 6.0 compared with 4.1 previously. Hypermetabolic  lesion in the inferior right lobe has current SUV max of 6.2 compared to 3.0 previously. Several hypermetabolic right upper quadrant omental nodules are seen, largest measuring 9 mm on image 160/3, compared to 7 mm previously. This has current SUV max of 3.7 compared to 2.0 previously. Ill-defined soft tissue mass involving the celiac axis measures 5.8 x 4.2 cm, without significant change in size since previous study. This mass has SUV max of 7.3 compared to 4.3 previously. 18 mm portacaval lymph node showing peripheral calcification has SUV max of 8.8 on today's study, compared with 3.8 previously. 10 mm left inguinal lymph node has increased in size compared to 5 mm previously. This lymph node has SUV max of 6.7 on today's study, compared to 3.7 previously. Aortic atherosclerosis. Colonic diverticulosis again seen, without evidence of diverticulitis. SKELETON No focal hypermetabolic activity to suggest skeletal metastasis. IMPRESSION: Mild progression of hypermetabolic lymphadenopathy in right paratracheal region, celiac axis, portacaval space, and left inguinal region. Mild progression of small hypermetabolic liver metastases and omental soft tissue nodules in right upper quadrant. No significant change in tiny bilateral pulmonary metastases. Incidental findings including aortic and coronary atherosclerosis and colonic diverticulosis. Electronically Signed    By: Earle Gell M.D.   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  PLAN:    1. Stage IV adenocarcinoma of the lower third esophagus, HER-2 positive, with liver metastasis: PET scan results from March 23, 2016 reviewed independently and reported above with mild progression of disease. Patient's tumor markers are also increasing. Despite this, we will proceed with cycle 9 of FOLFOX plus Herceptin today.  Although, patient has acknowledged he likely will require to switch treatments in the near future. His most recent MUGA scan on March 20, 2016 reported an EF of 52.8% which is unchanged from previous. Return to clinic in 2 days for pump removal and Neulasta and then in 3 weeks for consideration of cycle 10. 2. Iron deficiency anemia: Consider IV iron in the future. 3. Recent MI: Treatment per cardiology. MUGA as above. 4. Weight loss: Improving. Likely secondary to underlying malignancy, monitor. 5. Dysphagia: XRT has been completed. 6. Neutropenia: Continue Neulasta with the remainder of his cycles. 7. Weakness and fatigue: Improving. 8. Ankle ulcer: Nonhealing, continue treatment per wound care.  Patient expressed understanding and was in agreement with this plan. He also understands that He can call clinic at any time with any questions, concerns, or complaints.    Lloyd Huger, MD   03/28/2016 5:26 PM

## 2016-03-27 ENCOUNTER — Inpatient Hospital Stay: Payer: Medicare Other | Attending: Internal Medicine | Admitting: Oncology

## 2016-03-27 ENCOUNTER — Inpatient Hospital Stay: Payer: Medicare Other

## 2016-03-27 VITALS — BP 134/73 | HR 67 | Temp 97.2°F | Resp 18 | Wt 130.3 lb

## 2016-03-27 DIAGNOSIS — C787 Secondary malignant neoplasm of liver and intrahepatic bile duct: Secondary | ICD-10-CM

## 2016-03-27 DIAGNOSIS — C155 Malignant neoplasm of lower third of esophagus: Secondary | ICD-10-CM

## 2016-03-27 DIAGNOSIS — I252 Old myocardial infarction: Secondary | ICD-10-CM | POA: Diagnosis not present

## 2016-03-27 DIAGNOSIS — Z5111 Encounter for antineoplastic chemotherapy: Secondary | ICD-10-CM | POA: Insufficient documentation

## 2016-03-27 DIAGNOSIS — Z5112 Encounter for antineoplastic immunotherapy: Secondary | ICD-10-CM | POA: Insufficient documentation

## 2016-03-27 DIAGNOSIS — K5900 Constipation, unspecified: Secondary | ICD-10-CM | POA: Diagnosis not present

## 2016-03-27 DIAGNOSIS — R1310 Dysphagia, unspecified: Secondary | ICD-10-CM

## 2016-03-27 DIAGNOSIS — D72829 Elevated white blood cell count, unspecified: Secondary | ICD-10-CM | POA: Insufficient documentation

## 2016-03-27 DIAGNOSIS — R634 Abnormal weight loss: Secondary | ICD-10-CM | POA: Diagnosis not present

## 2016-03-27 DIAGNOSIS — Z923 Personal history of irradiation: Secondary | ICD-10-CM | POA: Diagnosis not present

## 2016-03-27 DIAGNOSIS — Z8673 Personal history of transient ischemic attack (TIA), and cerebral infarction without residual deficits: Secondary | ICD-10-CM | POA: Insufficient documentation

## 2016-03-27 DIAGNOSIS — R531 Weakness: Secondary | ICD-10-CM | POA: Diagnosis not present

## 2016-03-27 DIAGNOSIS — G4730 Sleep apnea, unspecified: Secondary | ICD-10-CM | POA: Diagnosis not present

## 2016-03-27 DIAGNOSIS — D509 Iron deficiency anemia, unspecified: Secondary | ICD-10-CM | POA: Insufficient documentation

## 2016-03-27 DIAGNOSIS — E1151 Type 2 diabetes mellitus with diabetic peripheral angiopathy without gangrene: Secondary | ICD-10-CM | POA: Diagnosis not present

## 2016-03-27 DIAGNOSIS — R5383 Other fatigue: Secondary | ICD-10-CM | POA: Diagnosis not present

## 2016-03-27 DIAGNOSIS — Z79899 Other long term (current) drug therapy: Secondary | ICD-10-CM

## 2016-03-27 DIAGNOSIS — E039 Hypothyroidism, unspecified: Secondary | ICD-10-CM | POA: Diagnosis not present

## 2016-03-27 DIAGNOSIS — Z7689 Persons encountering health services in other specified circumstances: Secondary | ICD-10-CM | POA: Insufficient documentation

## 2016-03-27 DIAGNOSIS — I119 Hypertensive heart disease without heart failure: Secondary | ICD-10-CM | POA: Diagnosis not present

## 2016-03-27 DIAGNOSIS — Z7984 Long term (current) use of oral hypoglycemic drugs: Secondary | ICD-10-CM | POA: Diagnosis not present

## 2016-03-27 DIAGNOSIS — Z7982 Long term (current) use of aspirin: Secondary | ICD-10-CM | POA: Diagnosis not present

## 2016-03-27 DIAGNOSIS — L97309 Non-pressure chronic ulcer of unspecified ankle with unspecified severity: Secondary | ICD-10-CM | POA: Diagnosis not present

## 2016-03-27 DIAGNOSIS — Z87891 Personal history of nicotine dependence: Secondary | ICD-10-CM | POA: Insufficient documentation

## 2016-03-27 LAB — CBC WITH DIFFERENTIAL/PLATELET
BASOS ABS: 0.1 10*3/uL (ref 0–0.1)
Basophils Relative: 0 %
EOS ABS: 0.1 10*3/uL (ref 0–0.7)
EOS PCT: 1 %
HCT: 30.5 % — ABNORMAL LOW (ref 40.0–52.0)
HEMOGLOBIN: 10.2 g/dL — AB (ref 13.0–18.0)
LYMPHS ABS: 0.4 10*3/uL — AB (ref 1.0–3.6)
Lymphocytes Relative: 3 %
MCH: 30.9 pg (ref 26.0–34.0)
MCHC: 33.4 g/dL (ref 32.0–36.0)
MCV: 92.7 fL (ref 80.0–100.0)
Monocytes Absolute: 1.2 10*3/uL — ABNORMAL HIGH (ref 0.2–1.0)
Monocytes Relative: 7 %
NEUTROS PCT: 89 %
Neutro Abs: 15.5 10*3/uL — ABNORMAL HIGH (ref 1.4–6.5)
PLATELETS: 307 10*3/uL (ref 150–440)
RBC: 3.29 MIL/uL — AB (ref 4.40–5.90)
RDW: 20.2 % — ABNORMAL HIGH (ref 11.5–14.5)
WBC: 17.3 10*3/uL — AB (ref 3.8–10.6)

## 2016-03-27 LAB — COMPREHENSIVE METABOLIC PANEL
ALBUMIN: 3.9 g/dL (ref 3.5–5.0)
ALK PHOS: 84 U/L (ref 38–126)
ALT: 20 U/L (ref 17–63)
AST: 35 U/L (ref 15–41)
Anion gap: 9 (ref 5–15)
BUN: 12 mg/dL (ref 6–20)
CHLORIDE: 97 mmol/L — AB (ref 101–111)
CO2: 26 mmol/L (ref 22–32)
CREATININE: 0.57 mg/dL — AB (ref 0.61–1.24)
Calcium: 9.1 mg/dL (ref 8.9–10.3)
GFR calc non Af Amer: >60 mL/min (ref >60)
GLUCOSE: 174 mg/dL — AB (ref 65–99)
Potassium: 4.1 mmol/L (ref 3.5–5.1)
Sodium: 132 mmol/L — ABNORMAL LOW (ref 135–145)
Total Bilirubin: 0.5 mg/dL (ref 0.3–1.2)
Total Protein: 7.2 g/dL (ref 6.5–8.1)

## 2016-03-27 MED ORDER — DEXTROSE 5 % IV SOLN
Freq: Once | INTRAVENOUS | Status: AC
  Administered 2016-03-27: 13:00:00 via INTRAVENOUS
  Filled 2016-03-27: qty 1000

## 2016-03-27 MED ORDER — SODIUM CHLORIDE 0.9 % IV SOLN
Freq: Once | INTRAVENOUS | Status: AC
  Administered 2016-03-27: 11:00:00 via INTRAVENOUS
  Filled 2016-03-27: qty 1000

## 2016-03-27 MED ORDER — ACETAMINOPHEN 325 MG PO TABS
650.0000 mg | ORAL_TABLET | Freq: Once | ORAL | Status: AC
  Administered 2016-03-27: 650 mg via ORAL
  Filled 2016-03-27: qty 2

## 2016-03-27 MED ORDER — LEUCOVORIN CALCIUM INJECTION 350 MG
700.0000 mg | Freq: Once | INTRAMUSCULAR | Status: AC
  Administered 2016-03-27: 700 mg via INTRAVENOUS
  Filled 2016-03-27: qty 35

## 2016-03-27 MED ORDER — TRASTUZUMAB CHEMO 150 MG IV SOLR: 230.0000 mg | Freq: Once | INTRAVENOUS | Status: DC

## 2016-03-27 MED ORDER — PALONOSETRON HCL INJECTION 0.25 MG/5ML
0.2500 mg | Freq: Once | INTRAVENOUS | Status: AC
  Administered 2016-03-27: 0.25 mg via INTRAVENOUS
  Filled 2016-03-27: qty 5

## 2016-03-27 MED ORDER — TRASTUZUMAB CHEMO 150 MG IV SOLR
4.0000 mg/kg | Freq: Once | INTRAVENOUS | Status: AC
  Administered 2016-03-27: 231 mg via INTRAVENOUS
  Filled 2016-03-27: qty 11

## 2016-03-27 MED ORDER — DEXAMETHASONE SODIUM PHOSPHATE 10 MG/ML IJ SOLN
10.0000 mg | Freq: Once | INTRAMUSCULAR | Status: AC
  Administered 2016-03-27: 10 mg via INTRAVENOUS
  Filled 2016-03-27: qty 1

## 2016-03-27 MED ORDER — FLUOROURACIL CHEMO INJECTION 2.5 GM/50ML
400.0000 mg/m2 | Freq: Once | INTRAVENOUS | Status: AC
Start: 2016-03-27 — End: 2016-03-27
  Administered 2016-03-27: 700 mg via INTRAVENOUS
  Filled 2016-03-27: qty 14

## 2016-03-27 MED ORDER — OXALIPLATIN CHEMO INJECTION 100 MG/20ML
85.0000 mg/m2 | Freq: Once | INTRAVENOUS | Status: AC
  Administered 2016-03-27: 150 mg via INTRAVENOUS
  Filled 2016-03-27: qty 10

## 2016-03-27 MED ORDER — SODIUM CHLORIDE 0.9 % IV SOLN
2400.0000 mg/m2 | INTRAVENOUS | Status: DC
  Administered 2016-03-27: 4200 mg via INTRAVENOUS
  Filled 2016-03-27: qty 84

## 2016-03-27 MED ORDER — DIPHENHYDRAMINE HCL 25 MG PO CAPS
25.0000 mg | ORAL_CAPSULE | Freq: Once | ORAL | Status: AC
  Administered 2016-03-27: 25 mg via ORAL
  Filled 2016-03-27: qty 1

## 2016-03-28 LAB — CEA: CEA: 372.7 ng/mL — AB (ref 0.0–4.7)

## 2016-03-28 LAB — CANCER ANTIGEN 19-9: CA 19 9: 25156 U/mL — AB (ref 0–35)

## 2016-03-29 ENCOUNTER — Inpatient Hospital Stay: Payer: Medicare Other

## 2016-03-29 VITALS — BP 144/73 | HR 75 | Temp 96.0°F | Resp 18

## 2016-03-29 DIAGNOSIS — Z5111 Encounter for antineoplastic chemotherapy: Secondary | ICD-10-CM | POA: Diagnosis not present

## 2016-03-29 DIAGNOSIS — D509 Iron deficiency anemia, unspecified: Secondary | ICD-10-CM | POA: Diagnosis not present

## 2016-03-29 DIAGNOSIS — C787 Secondary malignant neoplasm of liver and intrahepatic bile duct: Secondary | ICD-10-CM | POA: Diagnosis not present

## 2016-03-29 DIAGNOSIS — C155 Malignant neoplasm of lower third of esophagus: Secondary | ICD-10-CM | POA: Diagnosis not present

## 2016-03-29 DIAGNOSIS — Z7689 Persons encountering health services in other specified circumstances: Secondary | ICD-10-CM | POA: Diagnosis not present

## 2016-03-29 DIAGNOSIS — Z5112 Encounter for antineoplastic immunotherapy: Secondary | ICD-10-CM | POA: Diagnosis not present

## 2016-03-29 MED ORDER — HEPARIN SOD (PORK) LOCK FLUSH 100 UNIT/ML IV SOLN
500.0000 [IU] | Freq: Once | INTRAVENOUS | Status: AC | PRN
  Administered 2016-03-29: 500 [IU]

## 2016-03-29 MED ORDER — SODIUM CHLORIDE 0.9% FLUSH
10.0000 mL | INTRAVENOUS | Status: DC | PRN
  Administered 2016-03-29: 10 mL
  Filled 2016-03-29: qty 10

## 2016-03-29 MED ORDER — HEPARIN SOD (PORK) LOCK FLUSH 100 UNIT/ML IV SOLN
INTRAVENOUS | Status: AC
  Filled 2016-03-29: qty 5

## 2016-03-29 MED ORDER — PEGFILGRASTIM INJECTION 6 MG/0.6ML ~~LOC~~
6.0000 mg | PREFILLED_SYRINGE | Freq: Once | SUBCUTANEOUS | Status: AC
  Administered 2016-03-29: 6 mg via SUBCUTANEOUS
  Filled 2016-03-29: qty 0.6

## 2016-03-31 ENCOUNTER — Encounter: Payer: Medicare Other | Admitting: Surgery

## 2016-03-31 DIAGNOSIS — L97312 Non-pressure chronic ulcer of right ankle with fat layer exposed: Secondary | ICD-10-CM | POA: Diagnosis not present

## 2016-03-31 DIAGNOSIS — C155 Malignant neoplasm of lower third of esophagus: Secondary | ICD-10-CM | POA: Diagnosis not present

## 2016-03-31 DIAGNOSIS — L89512 Pressure ulcer of right ankle, stage 2: Secondary | ICD-10-CM | POA: Diagnosis not present

## 2016-03-31 DIAGNOSIS — D649 Anemia, unspecified: Secondary | ICD-10-CM | POA: Diagnosis not present

## 2016-03-31 DIAGNOSIS — E11622 Type 2 diabetes mellitus with other skin ulcer: Secondary | ICD-10-CM | POA: Diagnosis not present

## 2016-03-31 DIAGNOSIS — I10 Essential (primary) hypertension: Secondary | ICD-10-CM | POA: Diagnosis not present

## 2016-03-31 DIAGNOSIS — E441 Mild protein-calorie malnutrition: Secondary | ICD-10-CM | POA: Diagnosis not present

## 2016-03-31 DIAGNOSIS — E11621 Type 2 diabetes mellitus with foot ulcer: Secondary | ICD-10-CM | POA: Diagnosis not present

## 2016-04-01 NOTE — Progress Notes (Signed)
TURKI, BLOK ([id number]) Visit Report for 03/31/2016 Chief Complaint Document Details Patient Name: [name], [name]. Date of Service: 03/31/2016 10:30 AM Medical Record Number: [id number] Patient Account Number: [id number] Date of Birth/Sex: [birth year] (76 y.o. Male) Treating RN: Cornell Barman Primary Care Provider: LADA, [facility] Other Clinician: Referring Provider: LADA, [place] Treating Provider/Extender: Frann Rider in Treatment: 3 Information Obtained from: Patient Chief Complaint Patient is at the clinic for treatment of an open pressure ulcer to the right lateral ankle which she's had for about 2 months Electronic Signature(s) Signed: 03/31/2016 11:07:14 AM By: Christin Fudge MD, FACS Entered By: Christin Fudge on 03/31/2016 11:07:13 [name] ([id number]) -------------------------------------------------------------------------------- Debridement Details Patient Name: [name] Date of Service: 03/31/2016 10:30 AM Medical Record Number: [id number] Patient Account Number: [id number] Date of Birth/Sex: 31-Jul-1940 (76 y.o. Male) Treating RN: Cornell Barman Primary Care Provider: LADA, [place] Other Clinician: Referring Provider: LADA, [place] Treating Provider/Extender: Frann Rider in Treatment: 3 Debridement Performed for Wound #1 Right,Lateral Malleolus Assessment: Performed By: Physician Christin Fudge, MD Debridement: Debridement Pre-procedure Yes - 11:00 Verification/Time Out Taken: Start Time: 11:01 Pain Control: Other : lidocaine 4% Level: Skin/Subcutaneous Tissue Total Area Debrided (L x 0.9 (cm) x 0.9 (cm) = 0.81 (cm) W): Tissue and other Viable, Non-Viable, Fibrin/Slough, Subcutaneous material debrided: Instrument: Curette Bleeding: Minimum Hemostasis Achieved: Pressure End Time: 11:03 Procedural Pain: 2 Post Procedural Pain: 0 Response to Treatment: Procedure was tolerated well Post Debridement Measurements of Total Wound Length:  (cm) 0.9 Width: (cm) 0.9 Depth: (cm) 0.2 Volume: (cm) 0.127 Character of Wound/Ulcer Post Requires Further Debridement Debridement: Severity of Tissue Post Debridement: Fat layer exposed Post Procedure Diagnosis Same as Pre-procedure Electronic Signature(s) Signed: 03/31/2016 11:07:08 AM By: Christin Fudge MD, FACS Signed: 03/31/2016 4:57:49 PM By: Gretta Cool RN, BSN, Kim RN, BSN Entered By: Christin Fudge on 03/31/2016 11:07:08 [name] ([id number], GORE ([id number]) -------------------------------------------------------------------------------- HPI Details Patient Name: [name]. Date of Service: 03/31/2016 10:30 AM Medical Record Number: [id number] Patient Account Number: [id number] Date of Birth/Sex: January 01, 1941 (76 y.o. Male) Treating RN: Cornell Barman Primary Care Provider: LADA, [facility] Other Clinician: Referring Provider: LADA, [place] Treating Provider/Extender: Frann Rider in Treatment: 3 History of Present Illness Location: Patient presents with an ulcer on the right lateral ankle. Quality: Patient reports experiencing a dull pain to affected area(s). Severity: Patient states wound are getting worse. Duration: Patient has had the wound for > 2 months prior to seeking treatment at the wound center Timing: Pain in wound is Intermittent (comes and goes Context: The wound appeared gradually over time Modifying Factors: Other treatment(s) tried include:local care as per the medical oncologist Associated Signs and Symptoms: Patient reports having:some pain and no discharge HPI Description: 76 year old patient has been referred by his medical oncologist Dr. Grayland Ormond, for a right lateral malleolus ulcer has been there for several weeks. 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 He is currently on chemotherapy with FOLFOX and Herceptin. past medical history significant for diabetes mellitus, anemia,  hypertension, sleep apnea, status post appendectomy, peripheral vascular catheterization( Port placement) in September 2017 by Dr. Delana Meyer. He is also receiving palliative radiation therapy and was seen by Dr. Donella Stade. 03/17/2016 -- - x-ray of the right ankle -- IMPRESSION: 1. Soft tissue wound noted over the lateral malleolus, no underlying bony abnormality. No acute bony abnormality. 2. Diffuse degenerative change. 3. Peripheral vascular disease. Electronic Signature(s) Signed: 03/31/2016 11:29:21 AM By: Christin Fudge  MD, FACS Previous Signature: 03/31/2016 11:07:18 AM Version By: Christin Fudge MD, FACS Entered By: Christin Fudge on 03/31/2016 11:29:20 [name] ([id number]) -------------------------------------------------------------------------------- Physical Exam Details Patient Name: Jensen, Jeremiah Date of Service: 03/31/2016 10:30 AM Medical Record Number: [id number] Patient Account Number: [id number] Date of Birth/Sex: 17-Jul-1940 (76 y.o. Male) Treating RN: Cornell Barman Primary Care Provider: LADA, [facility] Other Clinician: Referring Provider: LADA, [place] Treating Provider/Extender: Frann Rider in Treatment: 3 Constitutional . Pulse regular. Respirations normal and unlabored. Afebrile. . Eyes Nonicteric. Reactive to light. Ears, Nose, Mouth, and Throat Lips, teeth, and gums WNL.Marland Kitchen Moist mucosa without lesions. Neck supple and nontender. No palpable supraclavicular or cervical adenopathy. Normal sized without goiter. Respiratory WNL. No retractions.. Breath sounds WNL, No rubs, rales, rhonchi, or wheeze.. Cardiovascular Heart rhythm and rate regular, no murmur or gallop.. Pedal Pulses WNL. No clubbing, cyanosis or edema. Chest Breasts symmetical and no nipple discharge.. Breast tissue WNL, no masses, lumps, or tenderness.. Lymphatic No adneopathy. No adenopathy. No adenopathy. Musculoskeletal Adexa without tenderness or enlargement.. Digits and nails w/o  clubbing, cyanosis, infection, petechiae, ischemia, or inflammatory conditions.. Integumentary (Hair, Skin) No suspicious lesions. No crepitus or fluctuance. No peri-wound warmth or erythema. No masses.Marland Kitchen Psychiatric Judgement and insight Intact.. No evidence of depression, anxiety, or agitation.. Notes there continues to be subcutaneous debris which are sharply removed with a #3 curet and minimal bleeding controlled with pressure Electronic Signature(s) Signed: 03/31/2016 11:07:35 AM By: Christin Fudge MD, FACS Entered By: Christin Fudge on 03/31/2016 11:07:35 [name] ([id number]) -------------------------------------------------------------------------------- Physician Orders Details Patient Name: [name] Date of Service: 03/31/2016 10:30 AM Medical Record Number: [id number] Patient Account Number: [id number] Date of Birth/Sex: 1940/07/21 (76 y.o. Male) Treating RN: Cornell Barman Primary Care Provider: LADA, [facility] Other Clinician: Referring Provider: LADA, [place] Treating Provider/Extender: Frann Rider in Treatment: 3 Verbal / Phone Orders: No Diagnosis Coding Wound Cleansing Wound #1 Right,Lateral Malleolus o Clean wound with Normal Saline. o Cleanse wound with mild soap and water Anesthetic Wound #1 Right,Lateral Malleolus o Topical Lidocaine 4% cream applied to wound bed prior to debridement Primary Wound Dressing Wound #1 Right,Lateral Malleolus o Santyl Ointment - Or Manuka Honey Secondary Dressing Wound #1 Right,Lateral Malleolus o Dry Gauze o Boardered Foam Dressing Dressing Change Frequency Wound #1 Right,Lateral Malleolus o Change dressing every day. Follow-up Appointments Wound #1 Right,Lateral Malleolus o Return Appointment in 1 week. Medications-please add to medication list. Wound #1 Right,Lateral Malleolus o Other: - Include these in your diet...VITAMIN A, C, ZINC, MVI Electronic Signature(s) Signed: 03/31/2016  3:53:40 PM By: Christin Fudge MD, FACS Signed: 03/31/2016 4:57:49 PM By: Gretta Cool RN, BSN, Kim RN, BSN [address], [name] ([id number]) Entered By: Gretta Cool, RN, BSN, Kim on 03/31/2016 11:07:36 [name] ([id number]) -------------------------------------------------------------------------------- Problem List Details Patient Name: MICHAELANDREW, ANEZ Date of Service: 03/31/2016 10:30 AM Medical Record Number: [id number] Patient Account Number: [id number] Date of Birth/Sex: [birth year] (76 y.o. Male) Treating RN: Cornell Barman Primary Care Provider: LADA, [facility] Other Clinician: Referring Provider: LADA, [place] Treating Provider/Extender: Frann Rider in Treatment: 3 Active Problems ICD-10 Encounter Code Description Active Date Diagnosis E11.621 Type 2 diabetes mellitus with foot ulcer 03/10/2016 Yes L89.512 Pressure ulcer of right ankle, stage 2 03/10/2016 Yes 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 03/10/2016 Yes C15.5 Malignant neoplasm of lower third of esophagus 03/10/2016 Yes Inactive Problems Resolved Problems Electronic Signature(s) Signed: 03/31/2016 11:28:20 AM By: Christin Fudge MD, FACS Previous Signature: 03/31/2016 11:06:37 AM Version By: Christin Fudge MD, FACS Entered By: Con Memos  Armoni Kludt on 03/31/2016 11:28:20 HALTON, SOLIMAN ([id number]) -------------------------------------------------------------------------------- Progress Note Details Patient Name: SUBHAN, AWADALLAH. Date of Service: 03/31/2016 10:30 AM Medical Record Number: [id number] Patient Account Number: [id number] Date of Birth/Sex: 02-10-1941 (76 y.o. Male) Treating RN: Cornell Barman Primary Care Provider: LADA, [facility] Other Clinician: Referring Provider: LADA, [place] Treating Provider/Extender: Frann Rider in Treatment: 3 Subjective Chief Complaint Information obtained from Patient Patient is at the clinic for treatment of an open pressure ulcer to the right lateral ankle which she's had for about 2  months History of Present Illness (HPI) The following HPI elements were documented for the patient's wound: Location: Patient presents with an ulcer on the right lateral ankle. Quality: Patient reports experiencing a dull pain to affected area(s). Severity: Patient states wound are getting worse. Duration: Patient has had the wound for > 2 months prior to seeking treatment at the wound center Timing: Pain in wound is Intermittent (comes and goes Context: The wound appeared gradually over time Modifying Factors: Other treatment(s) tried include:local care as per the medical oncologist Associated Signs and Symptoms: Patient reports having:some pain and no discharge 76 year old patient has been referred by his medical oncologist Dr. Grayland Ormond, for a right lateral malleolus ulcer has been there for several weeks. 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 He is currently on chemotherapy with FOLFOX and Herceptin. past medical history significant for diabetes mellitus, anemia, hypertension, sleep apnea, status post appendectomy, peripheral vascular catheterization( Port placement) in September 2017 by Dr. Delana Meyer. He is also receiving palliative radiation therapy and was seen by Dr. Donella Stade. 03/17/2016 -- - x-ray of the right ankle -- IMPRESSION: 1. Soft tissue wound noted over the lateral malleolus, no underlying bony abnormality. No acute bony abnormality. 2. Diffuse degenerative change. 3. Peripheral vascular disease. Allergies sufa drugs, Cephalosporins, lisinopril, losartin, Keflex, amoxicillin, ampicillin, Septra, Bactrim Objective LAVERT, MODENA ([id number]) Constitutional Pulse regular. Respirations normal and unlabored. Afebrile. Vitals Time Taken: 10:39 AM, Height: 67 in, Weight: 130.45 lbs, BMI: 20.4, Temperature: 98.1 F, Pulse: 77 bpm, Respiratory Rate: 16 breaths/min, Blood Pressure: 116/32 mmHg. Eyes Nonicteric. Reactive  to light. Ears, Nose, Mouth, and Throat Lips, teeth, and gums WNL.Marland Kitchen Moist mucosa without lesions. Neck supple and nontender. No palpable supraclavicular or cervical adenopathy. Normal sized without goiter. Respiratory WNL. No retractions.. Breath sounds WNL, No rubs, rales, rhonchi, or wheeze.. Cardiovascular Heart rhythm and rate regular, no murmur or gallop.. Pedal Pulses WNL. No clubbing, cyanosis or edema. Chest Breasts symmetical and no nipple discharge.. Breast tissue WNL, no masses, lumps, or tenderness.. Lymphatic No adneopathy. No adenopathy. No adenopathy. Musculoskeletal Adexa without tenderness or enlargement.. Digits and nails w/o clubbing, cyanosis, infection, petechiae, ischemia, or inflammatory conditions.Marland Kitchen Psychiatric Judgement and insight Intact.. No evidence of depression, anxiety, or agitation.. General Notes: there continues to be subcutaneous debris which are sharply removed with a #3 curet and minimal bleeding controlled with pressure Integumentary (Hair, Skin) No suspicious lesions. No crepitus or fluctuance. No peri-wound warmth or erythema. No masses.. Wound #1 status is Open. Original cause of wound was Gradually Appeared. The wound is located on the Right,Lateral Malleolus. The wound measures 0.9cm length x 0.9cm width x 0.1cm depth; 0.636cm^2 area and 0.064cm^3 volume. There is Fat Layer (Subcutaneous Tissue) Exposed exposed. There is no tunneling or undermining noted. There is a medium amount of serous drainage noted. The wound margin is flat and intact. There is no granulation within the wound bed. There is a  large (67-100%) amount of necrotic tissue within the wound bed including Adherent Slough. The periwound skin appearance exhibited: Erythema. The surrounding wound skin color is noted with erythema which is circumferential. Periwound temperature was GRIEZMANN, KULCZYK ([id number]) noted as No Abnormality. The periwound has tenderness on  palpation. Assessment Active Problems ICD-10 E11.621 - Type 2 diabetes mellitus with foot ulcer L89.512 - Pressure ulcer of right ankle, stage 2 E44.1 - Mild protein-calorie malnutrition C15.5 - Malignant neoplasm of lower third of esophagus Procedures Wound #1 Wound #1 is a Diabetic Wound/Ulcer of the Lower Extremity located on the Right,Lateral Malleolus . There was a Skin/Subcutaneous Tissue Debridement [id number]) debridement with total area of 0.81 sq cm performed by Christin Fudge, MD. with the following instrument(s): Curette to remove Viable and Non-Viable tissue/material including Fibrin/Slough and Subcutaneous after achieving pain control using Other (lidocaine 4%). A time out was conducted at 11:00, prior to the start of the procedure. A Minimum amount of bleeding was controlled with Pressure. The procedure was tolerated well with a pain level of 2 throughout and a pain level of 0 following the procedure. Post Debridement Measurements: 0.9cm length x 0.9cm width x 0.2cm depth; 0.127cm^3 volume. Character of Wound/Ulcer Post Debridement requires further debridement. Severity of Tissue Post Debridement is: Fat layer exposed. Post procedure Diagnosis Wound #1: Same as Pre-Procedure Plan Wound Cleansing: Wound #1 Right,Lateral Malleolus: Clean wound with Normal Saline. Cleanse wound with mild soap and water Anesthetic: Wound #1 Right,Lateral Malleolus: Topical Lidocaine 4% cream applied to wound bed prior to debridement TYREESE, SIDDOWAY ([id number]) Primary Wound Dressing: Wound #1 Right,Lateral Malleolus: Santyl Ointment - Or Manuka Honey Secondary Dressing: Wound #1 Right,Lateral Malleolus: Dry Gauze Boardered Foam Dressing Dressing Change Frequency: Wound #1 Right,Lateral Malleolus: Change dressing every day. Follow-up Appointments: Wound #1 Right,Lateral Malleolus: Return Appointment in 1 week. Medications-please add to medication list.: Wound #1 Right,Lateral  Malleolus: Other: - Include these in your diet...VITAMIN A, C, ZINC, MVI I have asked them to follow the following recommendations: 1. Daily dressing with Medihoney to be used (Santyl ointment was not affordable) . 2. Offloading has been discussed as much as possible in great detail 3. Adequate protein, vitamin A, vitamin C and zinc. 4. regular visits to the wound center the patient and his wife have read all questions answered and will be compliant Electronic Signature(s) Signed: 03/31/2016 11:29:36 AM By: Christin Fudge MD, FACS Previous Signature: 03/31/2016 11:08:02 AM Version By: Christin Fudge MD, FACS Entered By: Christin Fudge on 03/31/2016 11:29:36 [name] ([id number]) -------------------------------------------------------------------------------- SuperBill Details Patient Name: [name] Date of Service: 03/31/2016 Medical Record Number: [id number] Patient Account Number: [id number] Date of Birth/Sex: [birth year] (76 y.o. Male) Treating RN: Cornell Barman Primary Care Provider: LADA, [place] Other Clinician: Referring Provider: LADA, [place] Treating Provider/Extender: Christin Fudge Service Line: Outpatient Weeks in Treatment: 3 Diagnosis Coding ICD-10 Codes Code Description E11.621 Type 2 diabetes mellitus with foot ulcer L89.512 Pressure ulcer of right ankle, stage 2 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 C15.5 Malignant neoplasm of lower third of esophagus Facility Procedures CPT4 Code: [id number] Description: [id number] - DEB SUBQ TISSUE 20 SQ CM/< ICD-10 Description Diagnosis E11.621 Type 2 diabetes mellitus with foot ulcer L89.512 Pressure ulcer of right ankle, stage 2 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 C15.5 Malignant neoplasm of lower third  of esophagu Modifier: s Quantity: 1 Physician Procedures CPT4 Code: [id number] Description: 11042 - WC PHYS SUBQ TISS 20 SQ CM ICD-10 Description Diagnosis E11.621 Type 2 diabetes mellitus with foot ulcer L89.512 Pressure  ulcer  of right ankle, stage 2 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 C15.5 Malignant neoplasm of lower third  of esophagus Modifier: Quantity: 1 Electronic Signature(s) Signed: 03/31/2016 11:08:12 AM By: Christin Fudge MD, FACS Entered By: Christin Fudge on 03/31/2016 11:08:11

## 2016-04-01 NOTE — Progress Notes (Signed)
TAWFIQ, BOQUIST ([id number]) Visit Report for 03/31/2016 Allergy List Details Patient Name: [name], [name]. Date of Service: 03/31/2016 10:30 AM Medical Record Number: [id number] Patient Account Number: [id number] Date of Birth/Sex: [birth year] (76 y.o. Male) Treating RN: Cornell Barman Primary Care Shaunda Tipping: LADA, [facility] Other Clinician: Referring Azana Kiesler: LADA, [place] Treating [name]/Extender: Frann Rider in Treatment: 3 Allergies Active Allergies sufa drugs Cephalosporins lisinopril losartin Keflex amoxicillin ampicillin Septra Bactrim Allergy Notes Electronic Signature(s) Signed: 03/31/2016 4:57:49 PM By: Gretta Cool, RN, BSN, Kim RN, BSN Entered By: Gretta Cool, RN, BSN, Kim on 03/31/2016 10:38:19 [name] ([id number]) -------------------------------------------------------------------------------- Arrival Information Details Patient Name: [name], [name]. Date of Service: 03/31/2016 10:30 AM Medical Record Number: [id number] Patient Account Number: [id number] Date of Birth/Sex: Apr 24, 1940 (76 y.o. Male) Treating RN: Cornell Barman Primary Care Chrystel Barefield: LADA, [place] Other Clinician: Referring Elmon Shader: LADA, [place] Treating Camarion Weier/Extender: Frann Rider in Treatment: 3 Visit Information Patient Arrived: Ambulatory Arrival Time: 10:39 Accompanied By: wife, [name] Transfer Assistance: None Patient Identification Verified: Yes Secondary Verification Process Yes Completed: Patient Requires Transmission- No Based Precautions: Patient Has Alerts: Yes Patient Alerts: Patient on Blood Thinner DM II 81mg  aspirin twice daily History Since Last Visit Added or deleted any medications: No Any new allergies or adverse reactions: No Had a fall or experienced change in activities of daily living that may affect risk of falls: No Signs or symptoms of abuse/neglect since last visito No Has Dressing in Place as Prescribed: Yes Electronic Signature(s) Signed:  03/31/2016 4:57:49 PM By: Gretta Cool, RN, BSN, Kim RN, BSN Entered By: Gretta Cool, RN, BSN, Kim on 03/31/2016 10:39:36 [name] ([id number]) -------------------------------------------------------------------------------- Encounter Discharge Information Details Patient Name: [name], [name]. Date of Service: 03/31/2016 10:30 AM Medical Record Number: [id number] Patient Account Number: [id number] Date of Birth/Sex: Sep 25, 1940 (76 y.o. Male) Treating RN: Cornell Barman Primary Care Hagen Tidd: LADA, [facility] Other Clinician: Referring Vivion Romano: LADA, [place] Treating Takara Sermons/Extender: Frann Rider in Treatment: 3 Encounter Discharge Information Items Discharge Pain Level: 0 Discharge Condition: Stable Ambulatory Status: Ambulatory Discharge Destination: Home Transportation: Private Auto Accompanied By: wife Schedule Follow-up Appointment: Yes Medication Reconciliation completed and provided to Patient/Care Yes Latora Quarry: Provided on Clinical Summary of Care: 03/31/2016 Form Type Recipient Paper Patient FW Electronic Signature(s) Signed: 03/31/2016 4:57:49 PM By: Gretta Cool RN, BSN, Kim RN, BSN Previous Signature: 03/31/2016 11:08:07 AM Version By: Ruthine Dose Entered By: Gretta Cool RN, BSN, Kim on 03/31/2016 11:10:54 [name] ([id number]) -------------------------------------------------------------------------------- Lower Extremity Assessment Details Patient Name: [name], [name]. Date of Service: 03/31/2016 10:30 AM Medical Record Number: [id number] Patient Account Number: [id number] Date of Birth/Sex: Aug 22, 1940 (76 y.o. Male) Treating RN: Cornell Barman Primary Care Berley Gambrell: LADA, [facility] Other Clinician: Referring Camey Edell: LADA, [place] Treating Adia Crammer/Extender: Frann Rider in Treatment: 3 Vascular Assessment Pulses: Dorsalis Pedis Palpable: [Right:Yes] Posterior Tibial Palpable: [Right:Yes] Extremity colors, hair growth, and conditions: Extremity Color:  [Right:Normal] Hair Growth on Extremity: [Right:No] Temperature of Extremity: [Right:Warm] Capillary Refill: [Right:< 3 seconds] Dependent Rubor: [Right:No] Blanched when Elevated: [Right:No] Lipodermatosclerosis: [Right:No] Toe Nail Assessment Left: Right: Thick: Yes Discolored: Yes Deformed: Yes Improper Length and Hygiene: Yes Electronic Signature(s) Signed: 03/31/2016 4:57:49 PM By: Gretta Cool, RN, BSN, Kim RN, BSN Entered By: Gretta Cool, RN, BSN, Kim on 03/31/2016 10:49:07 [name] ([id number]) -------------------------------------------------------------------------------- Multi Wound Chart Details Patient Name: [name] Date of Service: 03/31/2016 10:30 AM Medical Record Number: [id number] Patient Account Number: [id number] Date of Birth/Sex: 05-15-1940 (76 y.o. Male) Treating RN: Cornell Barman Primary Care Loral Campi: LADA, [facility] Other Clinician: Referring Galit Urich:  LADA, [place] Treating Correy Weidner/Extender: Christin Fudge Weeks in Treatment: 3 Vital Signs Height(in): 67 Pulse(bpm): 77 Weight(lbs): 130.45 Blood Pressure 116/32 (mmHg): Body Mass Index(BMI): 20 Temperature(F): 98.1 Respiratory Rate 16 (breaths/min): Photos: [N/A:N/A] Wound Location: Right Malleolus - Lateral N/A N/A Wounding Event: Gradually Appeared N/A N/A Primary Etiology: 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 Secondary Etiology: Pressure Ulcer N/A N/A Comorbid History: Cataracts, Chronic sinus N/A N/A problems/congestion, Anemia, Sleep Apnea, Hypertension, Myocardial Infarction, Type II Diabetes, Received Chemotherapy, Received Radiation Date Acquired: 01/09/2016 N/A N/A Weeks of Treatment: 3 N/A N/A Wound Status: Open N/A N/A Measurements L x W x D 0.9x0.9x0.1 N/A N/A (cm) Area (cm) : 0.636 N/A N/A Volume (cm) : 0.064 N/A N/A % Reduction in Area: -169.50% N/A N/A % Reduction in Volume: -166.70% N/A N/A Classification: Grade 2 N/A N/A Exudate Amount: Medium N/A  N/A Exudate Type: Serous N/A N/A [name], [name] ([id number]) Exudate Color: amber N/A N/A Wound Margin: Flat and Intact N/A N/A Granulation Amount: None Present (0%) N/A N/A Necrotic Amount: Large (67-100%) N/A N/A Exposed Structures: Fat Layer (Subcutaneous N/A N/A Tissue) Exposed: Yes Fascia: No Tendon: No Muscle: No Joint: No Bone: No Epithelialization: None N/A N/A Debridement: Debridement [id number] N/A N/A [id number]) Pre-procedure 11:00 N/A N/A Verification/Time Out Taken: Pain Control: Other N/A N/A Tissue Debrided: Fibrin/Slough, N/A N/A Subcutaneous Level: Skin/Subcutaneous N/A N/A Tissue Debridement Area (sq 0.81 N/A N/A cm): Instrument: Curette N/A N/A Bleeding: Minimum N/A N/A Hemostasis Achieved: Pressure N/A N/A Procedural Pain: 2 N/A N/A Post Procedural Pain: 0 N/A N/A Debridement Treatment Procedure was tolerated N/A N/A Response: well Post Debridement 0.9x0.9x0.2 N/A N/A Measurements L x W x D (cm) Post Debridement 0.127 N/A N/A Volume: (cm) Periwound Skin Texture: No Abnormalities Noted N/A N/A Periwound Skin No Abnormalities Noted N/A N/A Moisture: Periwound Skin Color: Erythema: Yes N/A N/A Erythema Location: Circumferential N/A N/A Temperature: No Abnormality N/A N/A Tenderness on Yes N/A N/A Palpation: Wound Preparation: Ulcer Cleansing: N/A N/A Rinsed/Irrigated with Saline Topical Anesthetic [name], [name] ([id number]) Applied: Other: lidociane 4% Procedures Performed: Debridement N/A N/A Treatment Notes Wound #1 (Right, Lateral Malleolus) 1. Cleansed with: Clean wound with Normal Saline 2. Anesthetic Topical Lidocaine 4% cream to wound bed prior to debridement 4. Dressing Applied: Santyl Ointment 5. Secondary Dressing Applied Bordered Foam Dressing Electronic Signature(s) Signed: 03/31/2016 11:28:27 AM By: Christin Fudge MD, FACS Previous Signature: 03/31/2016 11:06:41 AM Version By: Christin Fudge MD, FACS Entered By: Christin Fudge on 03/31/2016 11:28:27 [name] ([id number]) -------------------------------------------------------------------------------- [place] Details Patient Name: [name], [name] Date of Service: 03/31/2016 10:30 AM Medical Record Number: [id number] Patient Account Number: [id number] Date of Birth/Sex: [birth year] (76 y.o. Male) Treating RN: Cornell Barman Primary Care Estoria Geary: LADA, [facility] Other Clinician: Referring Lonie Rummell: LADA, [place] Treating Briella Hobday/Extender: Frann Rider in Treatment: 3 Active Inactive ` Orientation to the Wound Care Program Nursing Diagnoses: Knowledge deficit related to the wound healing center program Goals: Patient/caregiver will verbalize understanding of the [place] Program Date Initiated: 03/10/2016 Target Resolution Date: 03/23/2016 Goal Status: Active Interventions: Provide education on orientation to the wound center Notes: ` Soft Tissue Infection Nursing Diagnoses: Impaired tissue integrity Potential for infection: soft tissue Goals: Patient will remain free of wound infection Date Initiated: 03/10/2016 Target Resolution Date: 03/17/2016 Goal Status: Active Interventions: Assess signs and symptoms of infection every visit Notes: ` Wound/Skin Impairment Nursing Diagnoses: Impaired tissue integrity [name], [name] ([id number]) Goals: Ulcer/skin breakdown will heal within 14 weeks Date Initiated: 03/10/2016  Target Resolution Date: 03/24/2016 Goal Status: Active Interventions: Assess patient/caregiver ability to obtain necessary supplies Notes: Electronic Signature(s) Signed: 03/31/2016 4:57:49 PM By: Gretta Cool, RN, BSN, Kim RN, BSN Entered By: Gretta Cool, RN, BSN, Kim on 03/31/2016 10:49:10 [name] ([id number]) -------------------------------------------------------------------------------- Pain Assessment Details Patient Name: [name], [name] Date of Service: 03/31/2016 10:30 AM Medical  Record Number: [id number] Patient Account Number: [id number] Date of Birth/Sex: [birth year] (76 y.o. Male) Treating RN: Cornell Barman Primary Care Luba Matzen: LADA, [facility] Other Clinician: Referring Knoxx Boeding: LADA, [place] Treating Rune Mendez/Extender: Frann Rider in Treatment: 3 Active Problems Location of Pain Severity and Description of Pain Patient Has Paino No Site Locations With Dressing Change: No Pain Management and Medication Current Pain Management: Electronic Signature(s) Signed: 03/31/2016 4:57:49 PM By: Gretta Cool, RN, BSN, Kim RN, BSN Entered By: Gretta Cool, RN, BSN, Kim on 03/31/2016 10:39:44 [name] ([id number]) -------------------------------------------------------------------------------- Patient/Caregiver Education Details Patient Name: [name] Date of Service: 03/31/2016 10:30 AM Medical Record Number: [id number] Patient Account Number: [id number] Date of Birth/Gender: [birth year] (76 y.o. Male) Treating RN: Cornell Barman Primary Care Physician: LADA, [facility] Other Clinician: Referring Physician: LADA, [place] Treating Physician/Extender: Frann Rider in Treatment: 3 Education Assessment Education Provided To: Patient Education Topics Provided Wound/Skin Impairment: Handouts: Caring for Your Ulcer, Other: continue wound care as prescribed Methods: Demonstration, Explain/Verbal Responses: State content correctly Electronic Signature(s) Signed: 03/31/2016 4:57:49 PM By: Gretta Cool, RN, BSN, Kim RN, BSN Entered By: Gretta Cool, RN, BSN, Kim on 03/31/2016 11:11:12 [name] ([id number]) -------------------------------------------------------------------------------- Wound Assessment Details Patient Name: [name], [name]. Date of Service: 03/31/2016 10:30 AM Medical Record Number: [id number] Patient Account Number: [id number] Date of Birth/Sex: August 31, 1940 (76 y.o. Male) Treating RN: Cornell Barman Primary Care Dinari Stgermaine: LADA, [place] Other  Clinician: Referring Keron Neenan: LADA, [place] Treating Alphonse Asbridge/Extender: Frann Rider in Treatment: 3 Wound Status Wound Number: 1 Primary Diabetic Wound/Ulcer of the Lower Etiology: Extremity Wound Location: Right Malleolus - Lateral Secondary Pressure Ulcer Wounding Event: Gradually Appeared Etiology: Date Acquired: 01/09/2016 Wound Open Weeks Of Treatment: 3 Status: Clustered Wound: No Comorbid Cataracts, Chronic sinus History: problems/congestion, Anemia, Sleep Apnea, Hypertension, Myocardial Infarction, Type II Diabetes, Received Chemotherapy, Received Radiation Photos Wound Measurements Length: (cm) 0.9 Width: (cm) 0.9 Depth: (cm) 0.1 Area: (cm) 0.636 Volume: (cm) 0.064 % Reduction in Area: -169.5% % Reduction in Volume: -166.7% Epithelialization: None Tunneling: No Undermining: No Wound Description Classification: Grade 2 Foul Odor After Wound Margin: Flat and Intact Slough/Fibrino Exudate Amount: Medium Exudate Type: Serous Exudate Color: amber Cleansing: No Yes Wound Bed Granulation Amount: None Present (0%) Exposed Structure Necrotic Amount: Large (67-100%) Fascia Exposed: No Necrotic Quality: Adherent Slough Fat Layer (Subcutaneous Tissue) Exposed: Yes [name], [name] ([id number]) Tendon Exposed: No Muscle Exposed: No Joint Exposed: No Bone Exposed: No Periwound Skin Texture Texture Color No Abnormalities Noted: No No Abnormalities Noted: No Erythema: Yes Moisture Erythema Location: Circumferential No Abnormalities Noted: No Temperature / Pain Temperature: No Abnormality Tenderness on Palpation: Yes Wound Preparation Ulcer Cleansing: Rinsed/Irrigated with Saline Topical Anesthetic Applied: Other: lidociane 4%, Treatment Notes Wound #1 (Right, Lateral Malleolus) 1. Cleansed with: Clean wound with Normal Saline 2. Anesthetic Topical Lidocaine 4% cream to wound bed prior to debridement 4. Dressing Applied: Santyl  Ointment 5. Secondary Dressing Applied Bordered Foam Dressing Electronic Signature(s) Signed: 03/31/2016 4:57:49 PM By: Gretta Cool, RN, BSN, Kim RN, BSN Entered By: Gretta Cool, RN, BSN, Kim on 03/31/2016 10:45:13 [name] ([id number]) -------------------------------------------------------------------------------- [name] Details Patient Name: [name] Date of Service: 03/31/2016 10:30 AM Medical Record  Number: [id number] Patient Account Number: [id number] Date of Birth/Sex: [birth year] (76 y.o. Male) Treating RN: Cornell Barman Primary Care Chonita Gadea: LADA, [place] Other Clinician: Referring Deionte Spivack: LADA, [place] Treating Germaine Shenker/Extender: Frann Rider in Treatment: 3 Vital Signs Time Taken: 10:39 Temperature (F): 98.1 Height (in): 67 Pulse (bpm): 77 Weight (lbs): 130.45 Respiratory Rate (breaths/min): 16 Body Mass Index (BMI): 20.4 Blood Pressure (mmHg): 116/32 Reference Range: 80 - 120 mg / dl Electronic Signature(s) Signed: 03/31/2016 4:57:49 PM By: Gretta Cool, RN, BSN, Kim RN, BSN Entered By: Gretta Cool, RN, BSN, Kim on 03/31/2016 10:40:59

## 2016-04-04 ENCOUNTER — Other Ambulatory Visit: Payer: Self-pay | Admitting: Oncology

## 2016-04-04 ENCOUNTER — Telehealth: Payer: Self-pay | Admitting: *Deleted

## 2016-04-04 DIAGNOSIS — C155 Malignant neoplasm of lower third of esophagus: Secondary | ICD-10-CM

## 2016-04-04 NOTE — Telephone Encounter (Signed)
Left message on VM that Tx is changing

## 2016-04-04 NOTE — Telephone Encounter (Signed)
Called to report that they know his numbers have gone up and is asking if his treatment is going to change or stay the same. Also inquiring about a nutritional consult. Please advise

## 2016-04-04 NOTE — Telephone Encounter (Signed)
Chemotherapy regimen has changed. Patient will see Dr. Mike Gip that day to initiate cycle 1, day 1 of Taxol and cyramza

## 2016-04-04 NOTE — Progress Notes (Signed)
DISCONTINUE ON PATHWAY REGIMEN - Gastroesophageal  GEOS3: mFOLFOX6 q14 Days Until Progression or Unacceptable Toxicity    A cycle is every 14 days:     Oxaliplatin (Eloxatin(R)) 85 mg/m2 in 250 mL D5W IV over 2 hours day 1, q14 days       Dose Mod: None     Leucovorin 400 mg/m2 in 250 mL D5W IV over 2 hours day 1, q14 days, followed immediately by       Dose Mod: None     5-Fluorouracil 400 mg/m2 IV bolus over 2-4 minutes day 1, q14 days       Dose Mod: None     5-Fluorouracil 2,400 mg/m2 in _____mL NS CIV as a 46 hour infusion starting on day 1, q14 days       Dose Mod: None         Additional Orders: **Note: order sheet contains two q14 day cycles**  **Always confirm dose/schedule in your pharmacy ordering system**    REASON: Disease Progression PRIOR TREATMENT: GEOS3: mFOLFOX6 q14 Days Until Progression or Unacceptable Toxicity TREATMENT RESPONSE: Progressive Disease (PD)  START ON PATHWAY REGIMEN - Gastroesophageal  GEOS14: Ramucirumab 8 mg/kg Days 1, 15 + Paclitaxel 80 mg/m2 Days 1, 8, 15 q28 Days Until Progression or Unacceptable Toxicity    A cycle is every 28 days:     Ramucirumab (Cyramza (R)) 8 mg/kg in 250 mL NS IV over 60 min q2 weeks, PRIOR TO PACLITAXEL.  Dilute in NS only. 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 I would NOT be surprised if  this patient died in the next year Microsatellite/Mismatch Repair Status: Unknown  Intent of Therapy: Non-Curative / Palliative Intent, Discussed with Patient

## 2016-04-07 ENCOUNTER — Encounter: Payer: Medicare Other | Admitting: Surgery

## 2016-04-07 DIAGNOSIS — C155 Malignant neoplasm of lower third of esophagus: Secondary | ICD-10-CM | POA: Diagnosis not present

## 2016-04-07 DIAGNOSIS — E441 Mild protein-calorie malnutrition: Secondary | ICD-10-CM | POA: Diagnosis not present

## 2016-04-07 DIAGNOSIS — L89512 Pressure ulcer of right ankle, stage 2: Secondary | ICD-10-CM | POA: Diagnosis not present

## 2016-04-07 DIAGNOSIS — D649 Anemia, unspecified: Secondary | ICD-10-CM | POA: Diagnosis not present

## 2016-04-07 DIAGNOSIS — E11621 Type 2 diabetes mellitus with foot ulcer: Secondary | ICD-10-CM | POA: Diagnosis not present

## 2016-04-07 DIAGNOSIS — I10 Essential (primary) hypertension: Secondary | ICD-10-CM | POA: Diagnosis not present

## 2016-04-07 DIAGNOSIS — L97312 Non-pressure chronic ulcer of right ankle with fat layer exposed: Secondary | ICD-10-CM | POA: Diagnosis not present

## 2016-04-08 NOTE — Progress Notes (Signed)
[name], [name] ([id number]) Visit Report for 04/07/2016 Allergy List Details Patient Name: [name], [name]. Date of Service: 04/07/2016 11:15 AM Medical Record Number: [id number] Patient Account Number: [id number] Date of Birth/Sex: Sep 18, 1940 (76 y.o. Male) Treating RN: Baruch Gouty, RN, BSN, Velva Harman Primary Care Xochilt Conant: LADA, Rip Harbour Other Clinician: Referring Jhalil Silvera: LADA, [place] Treating Nely Dedmon/Extender: Frann Rider in Treatment: 4 Allergies Active Allergies sufa drugs Cephalosporins lisinopril losartin Keflex amoxicillin ampicillin Septra Bactrim Allergy Notes Electronic Signature(s) Signed: 04/07/2016 2:23:26 PM By: Regan Lemming BSN, RN Entered By: Regan Lemming on 04/07/2016 11:48:31 [name] ([id number]) -------------------------------------------------------------------------------- [place] Details Patient Name: [name] Date of Service: 04/07/2016 11:15 AM Medical Record Number: [id number] Patient Account Number: [id number] Date of Birth/Sex: [birth year] (76 y.o. Male) Treating RN: Afful, RN, BSN, [place] Primary Care Gurpreet Mikhail: LADA, [facility] Other Clinician: Referring Lenoir Facchini: LADA, [place] Treating Celsa Nordahl/Extender: Frann Rider in Treatment: 4 Visit Information Patient Arrived: Ambulatory Arrival Time: 11:33 Accompanied By: wife Transfer Assistance: None Patient Identification Verified: Yes Secondary Verification Process Yes Completed: Patient Requires Transmission- No Based Precautions: Patient Has Alerts: Yes Patient Alerts: Patient on Blood Thinner DM II 81mg  aspirin twice daily History Since Last Visit All ordered tests and consults were completed: No Added or deleted any medications: No Any new allergies or adverse reactions: No Had a fall or experienced change in activities of daily living that may affect risk of falls: No Signs or symptoms of abuse/neglect since last visito No Hospitalized since last visit:  No Has Dressing in Place as Prescribed: Yes Electronic Signature(s) Signed: 04/07/2016 2:23:26 PM By: Regan Lemming BSN, RN Entered By: Regan Lemming on 04/07/2016 11:34:21 [name] ([id number]) -------------------------------------------------------------------------------- Encounter Discharge Information Details Patient Name: [name] Date of Service: 04/07/2016 11:15 AM Medical Record Number: [id number] Patient Account Number: [id number] Date of Birth/Sex: 01/23/1941 (76 y.o. Male) Treating RN: Baruch Gouty, RN, BSN, Velva Harman Primary Care Jahaira Earnhart: LADA, Rip Harbour Other Clinician: Referring Randee Upchurch: LADA, [place] Treating Odarius Dines/Extender: Frann Rider in Treatment: 4 Encounter Discharge Information Items Discharge Pain Level: 0 Discharge Condition: Stable Ambulatory Status: Ambulatory Discharge Destination: Home Transportation: Private Auto Accompanied By: wife Schedule Follow-up Appointment: No Medication Reconciliation completed and provided to Patient/Care No Neomia Herbel: Provided on Clinical Summary of Care: 04/07/2016 Form Type Recipient Paper Patient FW Electronic Signature(s) Signed: 04/07/2016 2:13:22 PM By: Ruthine Dose Previous Signature: 04/07/2016 2:10:16 PM Version By: Regan Lemming BSN, RN Entered By: Ruthine Dose on 04/07/2016 14:13:22 [name] ([id number]) -------------------------------------------------------------------------------- Lower Extremity Assessment Details Patient Name: [name] Date of Service: 04/07/2016 11:15 AM Medical Record Number: [id number] Patient Account Number: [id number] Date of Birth/Sex: 08/18/1940 (76 y.o. Male) Treating RN: Baruch Gouty, RN, BSN, Velva Harman Primary Care Jarrell Armond: LADA, Rip Harbour Other Clinician: Referring Atlas Crossland: LADA, [place] Treating Glynis Hunsucker/Extender: Frann Rider in Treatment: 4 Vascular Assessment Claudication: Claudication Assessment [Right:None] Pulses: Dorsalis Pedis Palpable:  [Right:Yes] Posterior Tibial Extremity colors, hair growth, and conditions: Extremity Color: [Right:Mottled] Hair Growth on Extremity: [Right:No] Temperature of Extremity: [Right:Warm] Capillary Refill: [Right:< 3 seconds] Toe Nail Assessment Left: Right: Thick: Yes Discolored: Yes Deformed: Yes Improper Length and Hygiene: Yes Electronic Signature(s) Signed: 04/07/2016 2:23:26 PM By: Regan Lemming BSN, RN Entered By: Regan Lemming on 04/07/2016 11:38:23 [name] ([id number]) -------------------------------------------------------------------------------- Multi Wound Chart Details Patient Name: [name] Date of Service: 04/07/2016 11:15 AM Medical Record Number: [id number] Patient Account Number: [id number] Date of Birth/Sex: Feb 05, 1941 (76 y.o. Male) Treating RN: Baruch Gouty, RN, BSN, Velva Harman Primary Care Shadana Pry: LADA, Rip Harbour Other Clinician: Referring Ariq Khamis: LADA,  [place] Treating Mishelle Hassan/Extender: Frann Rider in Treatment: 4 Vital Signs Height(in): 67 Pulse(bpm): 68 Weight(lbs): 130.45 Blood Pressure 139/59 (mmHg): Body Mass Index(BMI): 20 Temperature(F): 98.2 Respiratory Rate 16 (breaths/min): Photos: [1:No Photos] [N/A:N/A] Wound Location: [1:Right Malleolus - Lateral] [N/A:N/A] Wounding Event: [1:Gradually Appeared] [N/A:N/A] Primary Etiology: [1:Diabetic Wound/Ulcer of the Lower Extremity] [N/A:N/A] Secondary Etiology: [1:Pressure Ulcer] [N/A:N/A] Comorbid History: [1:Cataracts, Chronic sinus problems/congestion, Anemia, Sleep Apnea, Hypertension, Myocardial Infarction, Type II Diabetes, Received Chemotherapy, Received Radiation] [N/A:N/A] Date Acquired: [1:01/09/2016] [N/A:N/A] Weeks of Treatment: [1:4] [N/A:N/A] Wound Status: [1:Open] [N/A:N/A] Measurements L x W x D 0.8x0.9x0.1 [N/A:N/A] (cm) Area (cm) : [1:0.565] [N/A:N/A] Volume (cm) : [1:0.057] [N/A:N/A] % Reduction in Area: [1:-139.40%] [N/A:N/A] % Reduction in Volume: -137.50%  [N/A:N/A] Classification: [1:Grade 2] [N/A:N/A] Exudate Amount: [1:Medium] [N/A:N/A] Exudate Type: [1:Serous] [N/A:N/A] Exudate Color: [1:amber] [N/A:N/A] Wound Margin: [1:Flat and Intact] [N/A:N/A] Granulation Amount: [1:None Present (0%)] [N/A:N/A] Necrotic Amount: [1:Large (67-100%)] [N/A:N/A] Exposed Structures: Fat Layer (Subcutaneous N/A N/A Tissue) Exposed: Yes Fascia: No Tendon: No Muscle: No Joint: No Bone: No Epithelialization: None N/A N/A Debridement: Debridement [id number] N/A N/A [id number]) Pre-procedure 11:52 N/A N/A Verification/Time Out Taken: Pain Control: Lidocaine 4% Topical N/A N/A Solution Tissue Debrided: Fibrin/Slough, N/A N/A Subcutaneous Level: Skin/Subcutaneous N/A N/A Tissue Debridement Area (sq 0.72 N/A N/A cm): Instrument: Curette N/A N/A Bleeding: Minimum N/A N/A Hemostasis Achieved: Pressure N/A N/A Procedural Pain: 0 N/A N/A Post Procedural Pain: 0 N/A N/A Debridement Treatment Procedure was tolerated N/A N/A Response: well Post Debridement 0.8x0.9x0.1 N/A N/A Measurements L x W x D (cm) Post Debridement 0.057 N/A N/A Volume: (cm) Periwound Skin Texture: Induration: Yes N/A N/A Periwound Skin Maceration: No N/A N/A Moisture: Dry/Scaly: No Periwound Skin Color: Erythema: Yes N/A N/A Erythema Location: Circumferential N/A N/A Erythema Change: Decreased N/A N/A Temperature: No Abnormality N/A N/A Tenderness on Yes N/A N/A Palpation: Wound Preparation: Ulcer Cleansing: N/A N/A Rinsed/Irrigated with Saline Topical Anesthetic Applied: Other: lidociane 4% Procedures Performed: Debridement N/A N/A [name], [name] ([id number]) Treatment Notes Electronic Signature(s) Signed: 04/07/2016 12:30:47 PM By: Christin Fudge MD, FACS Entered By: Christin Fudge on 04/07/2016 12:30:47 [name] ([id number]) -------------------------------------------------------------------------------- [place] Details Patient Name:  [name], [name] Date of Service: 04/07/2016 11:15 AM Medical Record Number: [id number] Patient Account Number: [id number] Date of Birth/Sex: [birth year] (76 y.o. Male) Treating RN: Baruch Gouty, RN, BSN, Velva Harman Primary Care Serin Thornell: LADA, Rip Harbour Other Clinician: Referring Yun Gutierrez: LADA, [place] Treating Cloyde Oregel/Extender: Frann Rider in Treatment: 4 Active Inactive ` Orientation to the Wound Care Program Nursing Diagnoses: Knowledge deficit related to the wound healing center program Goals: Patient/caregiver will verbalize understanding of the [facility] Program Date Initiated: 03/10/2016 Target Resolution Date: 03/23/2016 Goal Status: Active Interventions: Provide education on orientation to the wound center Notes: ` Soft Tissue Infection Nursing Diagnoses: Impaired tissue integrity Potential for infection: soft tissue Goals: Patient will remain free of wound infection Date Initiated: 03/10/2016 Target Resolution Date: 03/17/2016 Goal Status: Active Interventions: Assess signs and symptoms of infection every visit Notes: ` Wound/Skin Impairment Nursing Diagnoses: Impaired tissue integrity CUNG, DRENNON ([id number]) Goals: Ulcer/skin breakdown will heal within 14 weeks Date Initiated: 03/10/2016 Target Resolution Date: 03/24/2016 Goal Status: Active Interventions: Assess patient/caregiver ability to obtain necessary supplies Notes: Electronic Signature(s) Signed: 04/07/2016 2:23:26 PM By: Regan Lemming BSN, RN Entered By: Regan Lemming on 04/07/2016 11:43:06 [name] ([id number]) -------------------------------------------------------------------------------- Pain Assessment Details Patient Name: [name] Date of Service: 04/07/2016 11:15 AM Medical Record Number: [id number] Patient Account Number: [id number]  Date of Birth/Sex: Jun 26, 1940 (76 y.o. Male) Treating RN: Baruch Gouty, RN, BSN, Velva Harman Primary Care Shaneece Stockburger: LADA, Rip Harbour Other  Clinician: Referring Keneth Borg: LADA, [place] Treating Violia Knopf/Extender: Frann Rider in Treatment: 4 Active Problems Location of Pain Severity and Description of Pain Patient Has Paino No Site Locations With Dressing Change: Yes Pain Management and Medication Current Pain Management: Electronic Signature(s) Signed: 04/07/2016 2:23:26 PM By: Regan Lemming BSN, RN Entered By: Regan Lemming on 04/07/2016 11:34:29 [name] ([id number]) -------------------------------------------------------------------------------- Patient/Caregiver Education Details Patient Name: [name] Date of Service: 04/07/2016 11:15 AM Medical Record Number: [id number] Patient Account Number: [id number] Date of Birth/Gender: March 12, 1940 (76 y.o. Male) Treating RN: Baruch Gouty, RN, BSN, Velva Harman Primary Care Physician: LADA, Rip Harbour Other Clinician: Referring Physician: LADA, [place] Treating Physician/Extender: Frann Rider in Treatment: 4 Education Assessment Education Provided To: Patient Education Topics Provided Welcome To [facility]: Methods: Explain/Verbal Responses: State content correctly Wound Debridement: Methods: Explain/Verbal Responses: State content correctly Wound/Skin Impairment: Methods: Explain/Verbal Responses: State content correctly Electronic Signature(s) Signed: 04/07/2016 2:23:26 PM By: Regan Lemming BSN, RN Entered By: Regan Lemming on 04/07/2016 14:10:33 [name] ([id number]) -------------------------------------------------------------------------------- Wound Assessment Details Patient Name: [name] Date of Service: 04/07/2016 11:15 AM Medical Record Number: [id number] Patient Account Number: [id number] Date of Birth/Sex: April 24, 1940 (76 y.o. Male) Treating RN: Baruch Gouty, RN, BSN, [place] Primary Care Yuliza Cara: LADA, Rip Harbour Other Clinician: Referring Lawton Dollinger: LADA, [place] Treating Candita Borenstein/Extender: Frann Rider in Treatment:  4 Wound Status Wound Number: 1 Primary Diabetic Wound/Ulcer of the Lower Etiology: Extremity Wound Location: Right Malleolus - Lateral Secondary Pressure Ulcer Wounding Event: Gradually Appeared Etiology: Date Acquired: 01/09/2016 Wound Open Weeks Of Treatment: 4 Status: Clustered Wound: No Comorbid Cataracts, Chronic sinus History: problems/congestion, Anemia, Sleep Apnea, Hypertension, Myocardial Infarction, Type II Diabetes, Received Chemotherapy, Received Radiation Photos Photo Uploaded By: Regan Lemming on 04/07/2016 14:19:56 Wound Measurements Length: (cm) 0.8 Width: (cm) 0.9 Depth: (cm) 0.1 Area: (cm) 0.565 Volume: (cm) 0.057 % Reduction in Area: -139.4% % Reduction in Volume: -137.5% Epithelialization: None Tunneling: No Undermining: No Wound Description Classification: Grade 2 Wound Margin: Flat and Intact Exudate Amount: Medium Exudate Type: Serous Exudate Color: amber Foul Odor After Cleansing: No Slough/Fibrino Yes Wound Bed [name], [name] ([id number]) Granulation Amount: None Present (0%) Exposed Structure Necrotic Amount: Large (67-100%) Fascia Exposed: No Necrotic Quality: Adherent Slough Fat Layer (Subcutaneous Tissue) Exposed: Yes Tendon Exposed: No Muscle Exposed: No Joint Exposed: No Bone Exposed: No Periwound Skin Texture Texture Color No Abnormalities Noted: No No Abnormalities Noted: No Induration: Yes Erythema: Yes Erythema Location: Circumferential Moisture Erythema Change: Decreased No Abnormalities Noted: No Dry / Scaly: No Temperature / Pain Maceration: No Temperature: No Abnormality Tenderness on Palpation: Yes Wound Preparation Ulcer Cleansing: Rinsed/Irrigated with Saline Topical Anesthetic Applied: Other: lidociane 4%, Treatment Notes Wound #1 (Right, Lateral Malleolus) 1. Cleansed with: Clean wound with Normal Saline 4. Dressing Applied: Santyl Ointment 5. Secondary Dressing Applied Bordered Foam  Dressing Dry Gauze Electronic Signature(s) Signed: 04/07/2016 2:23:26 PM By: Regan Lemming BSN, RN Entered By: Regan Lemming on 04/07/2016 11:42:52 [name] ([id number]) -------------------------------------------------------------------------------- [place] Details Patient Name: [name] Date of Service: 04/07/2016 11:15 AM Medical Record Number: [id number] Patient Account Number: [id number] Date of Birth/Sex: 04/06/1940 (76 y.o. Male) Treating RN: Baruch Gouty, RN, BSN, Velva Harman Primary Care Furqan Gosselin: LADA, [place] Other Clinician: Referring Jazmen Lindenbaum: LADA, [place] Treating Enya Bureau/Extender: Frann Rider in Treatment: 4 Vital Signs Time Taken: 11:34 Temperature (F): 98.2 Height (in): 67 Pulse (bpm): 68 Weight (lbs):  130.45 Respiratory Rate (breaths/min): 16 Body Mass Index (BMI): 20.4 Blood Pressure (mmHg): 139/59 Reference Range: 80 - 120 mg / dl Electronic Signature(s) Signed: 04/07/2016 2:23:26 PM By: Regan Lemming BSN, RN Entered By: Regan Lemming on 04/07/2016 11:37:50

## 2016-04-08 NOTE — Progress Notes (Addendum)
Jeremiah, Jensen ([id number]) Visit Report for 04/07/2016 Chief Complaint Document Details Patient Name: [name], [name]. Date of Service: 04/07/2016 11:15 AM Medical Record Number: [id number] Patient Account Number: [id number] Date of Birth/Sex: December 28, 1940 (76 y.o. Male) Treating RN: Baruch Gouty, RN, BSN, Velva Harman Primary Care Provider: LADA, Rip Harbour Other Clinician: Referring Provider: LADA, [place] Treating Provider/Extender: Frann Rider in Treatment: 4 Information Obtained from: Patient Chief Complaint Patient is at the clinic for treatment of an open pressure ulcer to the right lateral ankle which she's had for about 2 months Electronic Signature(s) Signed: 04/07/2016 12:31:04 PM By: Christin Fudge MD, FACS Entered By: Christin Fudge on 04/07/2016 12:31:04 [name] ([id number]) -------------------------------------------------------------------------------- Debridement Details Patient Name: [name] Date of Service: 04/07/2016 11:15 AM Medical Record Number: [id number] Patient Account Number: [id number] Date of Birth/Sex: Sep 20, 1940 (76 y.o. Male) Treating RN: Baruch Gouty, RN, BSN, [place] Primary Care Provider: LADA, Rip Harbour Other Clinician: Referring Provider: LADA, [place] Treating Provider/Extender: Frann Rider in Treatment: 4 Debridement Performed for Wound #1 Right,Lateral Malleolus Assessment: Performed By: Physician Christin Fudge, MD Debridement: Debridement Pre-procedure Yes - 11:52 Verification/Time Out Taken: Start Time: 11:52 Pain Control: Lidocaine 4% Topical Solution Level: Skin/Subcutaneous Tissue Total Area Debrided (L x 0.8 (cm) x 0.9 (cm) = 0.72 (cm) W): Tissue and other Viable, Non-Viable, Fibrin/Slough, Subcutaneous material debrided: Instrument: Curette Bleeding: Minimum Hemostasis Achieved: Pressure End Time: 11:54 Procedural Pain: 0 Post Procedural Pain: 0 Response to Treatment: Procedure was tolerated well Post Debridement  Measurements of Total Wound Length: (cm) 0.8 Width: (cm) 0.9 Depth: (cm) 0.1 Volume: (cm) 0.057 Character of Wound/Ulcer Post Requires Further Debridement Debridement: Severity of Tissue Post Debridement: Fat layer exposed Post Procedure Diagnosis Same as Pre-procedure Electronic Signature(s) Signed: 04/07/2016 12:30:59 PM By: Christin Fudge MD, FACS Signed: 04/07/2016 2:23:26 PM By: Regan Lemming BSN, RN Entered By: Christin Fudge on 04/07/2016 12:30:59 DYNELL, DISKIN ([id number], SHAMON ([id number]) -------------------------------------------------------------------------------- HPI Details Patient Name: Jeremiah, Jensen. Date of Service: 04/07/2016 11:15 AM Medical Record Number: [id number] Patient Account Number: [id number] Date of Birth/Sex: [birth year] (76 y.o. Male) Treating RN: Baruch Gouty, RN, BSN, Velva Harman Primary Care Provider: LADA, Rip Harbour Other Clinician: Referring Provider: LADA, [place] Treating Provider/Extender: Frann Rider in Treatment: 4 History of Present Illness Location: Patient presents with an ulcer on the right lateral ankle. Quality: Patient reports experiencing a dull pain to affected area(s). Severity: Patient states wound are getting worse. Duration: Patient has had the wound for > 2 months prior to seeking treatment at the wound center Timing: Pain in wound is Intermittent (comes and goes Context: The wound appeared gradually over time Modifying Factors: Other treatment(s) tried include:local care as per the medical oncologist Associated Signs and Symptoms: Patient reports having:some pain and no discharge HPI Description: 77 year old patient has been referred by his medical oncologist Dr. Grayland Ormond, for a right lateral malleolus ulcer has been there for several weeks. 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 He is currently on chemotherapy with FOLFOX and Herceptin. past medical history  significant for diabetes mellitus, anemia, hypertension, sleep apnea, status post appendectomy, peripheral vascular catheterization( Port placement) in September 2017 by Dr. Delana Meyer. He is also receiving palliative radiation therapy and was seen by Dr. Donella Stade. 03/17/2016 -- - x-ray of the right ankle -- IMPRESSION: 1. Soft tissue wound noted over the lateral malleolus, no underlying bony abnormality. No acute bony abnormality. 2. Diffuse degenerative change. 3. Peripheral vascular disease. Electronic Signature(s) Signed: 04/07/2016 12:31:08  PM By: Christin Fudge MD, FACS Entered By: Christin Fudge on 04/07/2016 12:31:08 [name] ([id number]) -------------------------------------------------------------------------------- Physical Exam Details Patient Name: Jensen, Jeremiah Date of Service: 04/07/2016 11:15 AM Medical Record Number: [id number] Patient Account Number: [id number] Date of Birth/Sex: 12-17-1940 (76 y.o. Male) Treating RN: Baruch Gouty, RN, BSN, Velva Harman Primary Care Provider: LADA, Rip Harbour Other Clinician: Referring Provider: LADA, [place] Treating Provider/Extender: Frann Rider in Treatment: 4 Constitutional . Pulse regular. Respirations normal and unlabored. Afebrile. . Eyes Nonicteric. Reactive to light. Ears, Nose, Mouth, and Throat Lips, teeth, and gums WNL.Marland Kitchen Moist mucosa without lesions. Neck supple and nontender. No palpable supraclavicular or cervical adenopathy. Normal sized without goiter. Respiratory WNL. No retractions.. Cardiovascular Pedal Pulses WNL. No clubbing, cyanosis or edema. Lymphatic No adneopathy. No adenopathy. No adenopathy. Musculoskeletal Adexa without tenderness or enlargement.. Digits and nails w/o clubbing, cyanosis, infection, petechiae, ischemia, or inflammatory conditions.. Integumentary (Hair, Skin) No suspicious lesions. No crepitus or fluctuance. No peri-wound warmth or erythema. No masses.Marland Kitchen Psychiatric Judgement and insight  Intact.. No evidence of depression, anxiety, or agitation.. Notes the right lateral ankle wound continues to have some subcutaneous debris and using a #3 curet I have removed a significant amount of it and bleeding controlled with pressure Electronic Signature(s) Signed: 04/07/2016 12:31:39 PM By: Christin Fudge MD, FACS Entered By: Christin Fudge on 04/07/2016 12:31:39 [name] ([id number]) -------------------------------------------------------------------------------- Physician Orders Details Patient Name: [name] Date of Service: 04/07/2016 11:15 AM Medical Record Number: [id number] Patient Account Number: [id number] Date of Birth/Sex: 1941/01/29 (76 y.o. Male) Treating RN: Baruch Gouty, RN, BSN, Velva Harman Primary Care Provider: LADA, Rip Harbour Other Clinician: Referring Provider: LADA, [place] Treating Provider/Extender: Frann Rider in Treatment: 4 Verbal / Phone Orders: No Diagnosis Coding Wound Cleansing Wound #1 Right,Lateral Malleolus o Clean wound with Normal Saline. o Cleanse wound with mild soap and water Anesthetic Wound #1 Right,Lateral Malleolus o Topical Lidocaine 4% cream applied to wound bed prior to debridement Primary Wound Dressing Wound #1 Right,Lateral Malleolus o Santyl Ointment - Or Manuka Honey Secondary Dressing Wound #1 Right,Lateral Malleolus o Dry Gauze o Boardered Foam Dressing Dressing Change Frequency Wound #1 Right,Lateral Malleolus o Change dressing every day. Follow-up Appointments Wound #1 Right,Lateral Malleolus o Return Appointment in 1 week. Additional Orders / Instructions Wound #1 Right,Lateral Malleolus o Increase protein intake. o Activity as tolerated Medications-please add to medication list. Wound #1 Right,Lateral Malleolus o Other: - Include these in your diet...VITAMIN A, C, ZINC, MVI KEYSTON, SOLAND ([id number]) Electronic Signature(s) Signed: 04/07/2016 2:23:26 PM By: Regan Lemming BSN,  RN Signed: 04/07/2016 4:26:35 PM By: Christin Fudge MD, FACS Entered By: Regan Lemming on 04/07/2016 14:09:15 [name] ([id number]) -------------------------------------------------------------------------------- Problem List Details Patient Name: DONTAE, DIANTONIO Date of Service: 04/07/2016 11:15 AM Medical Record Number: [id number] Patient Account Number: [id number] Date of Birth/Sex: 1940-05-20 (76 y.o. Male) Treating RN: Baruch Gouty, RN, BSN, Velva Harman Primary Care Provider: LADA, Rip Harbour Other Clinician: Referring Provider: LADA, [place] Treating Provider/Extender: Frann Rider in Treatment: 4 Active Problems ICD-10 Encounter Code Description Active Date Diagnosis E11.621 Type 2 diabetes mellitus with foot ulcer 03/10/2016 Yes L89.512 Pressure ulcer of right ankle, stage 2 03/10/2016 Yes 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 03/10/2016 Yes C15.5 Malignant neoplasm of lower third of esophagus 03/10/2016 Yes Inactive Problems Resolved Problems Electronic Signature(s) Signed: 04/07/2016 12:30:42 PM By: Christin Fudge MD, FACS Entered By: Christin Fudge on 04/07/2016 12:30:42 [name] ([id number]) -------------------------------------------------------------------------------- Progress Note Details Patient Name: [name] Date of Service: 04/07/2016 11:15 AM Medical  Record Number: [id number] Patient Account Number: [id number] Date of Birth/Sex: November 24, 1940 (76 y.o. Male) Treating RN: Baruch Gouty, RN, BSN, Velva Harman Primary Care Provider: LADA, Rip Harbour Other Clinician: Referring Provider: LADA, MELINDA Treating Provider/Extender: Frann Rider in Treatment: 4 Subjective Chief Complaint Information obtained from Patient Patient is at the clinic for treatment of an open pressure ulcer to the right lateral ankle which she's had for about 2 months History of Present Illness (HPI) The following HPI elements were documented for the patient's wound: Location: Patient presents  with an ulcer on the right lateral ankle. Quality: Patient reports experiencing a dull pain to affected area(s). Severity: Patient states wound are getting worse. Duration: Patient has had the wound for > 2 months prior to seeking treatment at the wound center Timing: Pain in wound is Intermittent (comes and goes Context: The wound appeared gradually over time Modifying Factors: Other treatment(s) tried include:local care as per the medical oncologist Associated Signs and Symptoms: Patient reports having:some pain and no discharge 76 year old patient has been referred by his medical oncologist Dr. Grayland Ormond, for a right lateral malleolus ulcer has been there for several weeks. 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 He is currently on chemotherapy with FOLFOX and Herceptin. past medical history significant for diabetes mellitus, anemia, hypertension, sleep apnea, status post appendectomy, peripheral vascular catheterization( Port placement) in September 2017 by Dr. Delana Meyer. He is also receiving palliative radiation therapy and was seen by Dr. Donella Stade. 03/17/2016 -- - x-ray of the right ankle -- IMPRESSION: 1. Soft tissue wound noted over the lateral malleolus, no underlying bony abnormality. No acute bony abnormality. 2. Diffuse degenerative change. 3. Peripheral vascular disease. Allergies sufa drugs, Cephalosporins, lisinopril, losartin, Keflex, amoxicillin, ampicillin, Septra, Bactrim Objective AUSTIN, [place] ([id number]) Constitutional Pulse regular. Respirations normal and unlabored. Afebrile. Vitals Time Taken: 11:34 AM, Height: 67 in, Weight: 130.45 lbs, BMI: 20.4, Temperature: 98.2 F, Pulse: 68 bpm, Respiratory Rate: 16 breaths/min, Blood Pressure: 139/59 mmHg. Eyes Nonicteric. Reactive to light. Ears, Nose, Mouth, and Throat Lips, teeth, and gums WNL.Marland Kitchen Moist mucosa without lesions. Neck supple and nontender. No  palpable supraclavicular or cervical adenopathy. Normal sized without goiter. Respiratory WNL. No retractions.. Cardiovascular Pedal Pulses WNL. No clubbing, cyanosis or edema. Lymphatic No adneopathy. No adenopathy. No adenopathy. Musculoskeletal Adexa without tenderness or enlargement.. Digits and nails w/o clubbing, cyanosis, infection, petechiae, ischemia, or inflammatory conditions.Marland Kitchen Psychiatric Judgement and insight Intact.. No evidence of depression, anxiety, or agitation.. General Notes: the right lateral ankle wound continues to have some subcutaneous debris and using a #3 curet I have removed a significant amount of it and bleeding controlled with pressure Integumentary (Hair, Skin) No suspicious lesions. No crepitus or fluctuance. No peri-wound warmth or erythema. No masses.. Wound #1 status is Open. Original cause of wound was Gradually Appeared. The wound is located on the Right,Lateral Malleolus. The wound measures 0.8cm length x 0.9cm width x 0.1cm depth; 0.565cm^2 area and 0.057cm^3 volume. There is Fat Layer (Subcutaneous Tissue) Exposed exposed. There is no tunneling or undermining noted. There is a medium amount of serous drainage noted. The wound margin is flat and intact. There is no granulation within the wound bed. There is a large (67-100%) amount of necrotic tissue within the wound bed including Adherent Slough. The periwound skin appearance exhibited: Induration, Erythema. The periwound skin appearance did not exhibit: Dry/Scaly, Maceration. The surrounding wound skin color is noted with erythema which is circumferential. Periwound temperature was noted as  No Abnormality. The periwound has tenderness on palpation. SHINJI, MEEKER ([id number]) Assessment Active Problems ICD-10 E11.621 - Type 2 diabetes mellitus with foot ulcer L89.512 - Pressure ulcer of right ankle, stage 2 E44.1 - Mild protein-calorie malnutrition C15.5 - Malignant neoplasm of lower third of  esophagus Procedures Wound #1 Wound #1 is a Diabetic Wound/Ulcer of the Lower Extremity located on the Right,Lateral Malleolus . There was a Skin/Subcutaneous Tissue Debridement [id number]) debridement with total area of 0.72 sq cm performed by Christin Fudge, MD. with the following instrument(s): Curette to remove Viable and Non-Viable tissue/material including Fibrin/Slough and Subcutaneous after achieving pain control using Lidocaine 4% Topical Solution. A time out was conducted at 11:52, prior to the start of the procedure. A Minimum amount of bleeding was controlled with Pressure. The procedure was tolerated well with a pain level of 0 throughout and a pain level of 0 following the procedure. Post Debridement Measurements: 0.8cm length x 0.9cm width x 0.1cm depth; 0.057cm^3 volume. Character of Wound/Ulcer Post Debridement requires further debridement. Severity of Tissue Post Debridement is: Fat layer exposed. Post procedure Diagnosis Wound #1: Same as Pre-Procedure Plan Wound Cleansing: Wound #1 Right,Lateral Malleolus: Clean wound with Normal Saline. Cleanse wound with mild soap and water Anesthetic: Wound #1 Right,Lateral Malleolus: Topical Lidocaine 4% cream applied to wound bed prior to debridement Primary Wound Dressing: Wound #1 Right,Lateral Malleolus: Santyl Ointment - Or Manuka Honey AMAJE, IGOU ([id number]) Secondary Dressing: Wound #1 Right,Lateral Malleolus: Dry Gauze Boardered Foam Dressing Dressing Change Frequency: Wound #1 Right,Lateral Malleolus: Change dressing every day. Follow-up Appointments: Wound #1 Right,Lateral Malleolus: Return Appointment in 1 week. Additional Orders / Instructions: Wound #1 Right,Lateral Malleolus: Increase protein intake. Activity as tolerated Medications-please add to medication list.: Wound #1 Right,Lateral Malleolus: Other: - Include these in your diet...VITAMIN A, C, ZINC, MVI I have asked them to follow the  following recommendations: 1. Daily dressing with Medihoney to be used (Santyl ointment was not affordable) . 2. Offloading has been discussed as much as possible in great detail 3. Adequate protein, vitamin A, vitamin C and zinc. 4. regular visits to the wound center the patient's wife has had all questions answered and will be compliant Electronic Signature(s) Signed: 04/07/2016 4:30:35 PM By: Christin Fudge MD, FACS Previous Signature: 04/07/2016 12:32:33 PM Version By: Christin Fudge MD, FACS Entered By: Christin Fudge on 04/07/2016 16:30:35 [name] ([id number]) -------------------------------------------------------------------------------- SuperBill Details Patient Name: [name] Date of Service: 04/07/2016 Medical Record Number: [id number] Patient Account Number: [id number] Date of Birth/Sex: 1940/09/10 (76 y.o. Male) Treating RN: Baruch Gouty, RN, BSN, [place] Primary Care Provider: LADA, Rip Harbour Other Clinician: Referring Provider: LADA, [place] Treating Provider/Extender: Christin Fudge Service Line: Outpatient Weeks in Treatment: 4 Diagnosis Coding ICD-10 Codes Code Description E11.621 Type 2 diabetes mellitus with foot ulcer L89.512 Pressure ulcer of right ankle, stage 2 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 C15.5 Malignant neoplasm of lower third of esophagus Facility Procedures CPT4 Code: [id number] Description: [id number] - DEB SUBQ TISSUE 20 SQ CM/< ICD-10 Description Diagnosis E11.621 Type 2 diabetes mellitus with foot ulcer L89.512 Pressure ulcer of right ankle, stage 2 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 C15.5 Malignant neoplasm of lower third  of esophagus Modifier: Quantity: 1 Physician Procedures CPT4 Code: [id number] Description: [id number] - WC PHYS SUBQ TISS 20 SQ CM ICD-10 Description Diagnosis E11.621 Type 2 diabetes mellitus with foot ulcer L89.512 Pressure ulcer of right ankle, stage 2 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 C15.5 Malignant neoplasm of lower  third  of esophagus Modifier: Quantity: 1 Electronic  Signature(s) Signed: 04/07/2016 12:32:42 PM By: Christin Fudge MD, FACS Entered By: Christin Fudge on 04/07/2016 12:32:42

## 2016-04-09 ENCOUNTER — Other Ambulatory Visit: Payer: Self-pay | Admitting: Hematology and Oncology

## 2016-04-09 NOTE — Progress Notes (Signed)
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  INTERVAL HISTORY:  Patient returns to clinic today for further evaluation, discussion of his imaging results, and consideration of initiation of Day 1 of cycle #1 Cyramza and Taxol.  He received cycle #9 of FOLFOX plus Herceptin at last visit. At that time, he denied any complaint.  It was noted that his tumor marker was rapidly increasing in conjunction with the most recent PET scan revealing mild progression of disease.  Decision by Dr. Grayland Ormond was to discontinue FOLFOX + Herceptin and institute Cyramza and Taxol.  Symptomatically, he has had no problems except for constipation.  He is eating well, but not gaining weight.  He is swallowing without difficulty. He has an ulcer on his ankle for which wound care is following.  Regarding his elevated WBC, he received Neulasta with his last cycle.  He denies any fevers or infections.   REVIEW OF SYSTEMS:   Review of Systems  Constitutional: Negative for chills, fever, malaise/fatigue and weight loss.  HENT: Negative for congestion, ear pain, hearing loss, nosebleeds and sore throat.   Eyes: Negative for blurred vision, double vision and pain.  Respiratory: Negative.  Negative for cough and shortness of breath.   Cardiovascular: Negative for chest pain, palpitations, orthopnea, leg swelling and PND.  Gastrointestinal: Positive for constipation. Negative for abdominal pain, blood in stool, melena and vomiting.  Genitourinary: Negative  for dysuria, frequency, hematuria and urgency.  Musculoskeletal: Negative for back pain, falls, joint pain and myalgias.  Skin: Negative for itching and rash.       ulcer on ankle  Neurological: Negative for dizziness, tingling, sensory change, focal weakness, seizures, weakness and headaches.  Endo/Heme/Allergies: Negative for environmental allergies and polydipsia. Does not bruise/bleed easily.  Psychiatric/Behavioral: Negative.  Negative for depression. The patient does not have insomnia.     As per HPI. Otherwise, a complete review of systems is negative.  PAST MEDICAL HISTORY: Past Medical History:  Diagnosis Date  . Anemia   . Arthritis   . 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 Diabetes mellitus without complication ([place])   . 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 Esophageal cancer ([place])   . GI bleed   . Hearing loss    left ear  . Hypertension   . Hypothyroidism   . Liver mass   . Mild left atrial enlargement 08/12/2015   Very mild; LA 4.2 cm; echo March 2017  . Mitral valvular regurgitation 08/12/2015  . Sleep apnea   . Stroke ([place])   . TIA (transient ischemic attack)   . 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 APPENDECTOMY    . EUS N/A 10/07/2015   Procedure: FULL UPPER ENDOSCOPIC ULTRASOUND (EUS) RADIAL;  Surgeon: Holly Bodily, MD;  Location: [facility] ENDOSCOPY;  Service: Endoscopy;  Laterality: N/A;  . EYE SURGERY    . FRACTURE SURGERY    . NOSE SURGERY    . PERIPHERAL VASCULAR CATHETERIZATION N/A 10/26/2015   Procedure:  Porta Cath Insertion;  Surgeon: Katha Cabal, MD;  Location: [facility];  Service: Cardiovascular;  Laterality: N/A;    FAMILY HISTORY Family History  Problem Relation Age of Onset  . Hypertension Mother   . Lung cancer Father   . Hypertension Brother        ADVANCED DIRECTIVES:    HEALTH  MAINTENANCE: Social History  Substance Use Topics  . Smoking status: Former Smoker    Packs/day: 1.00    Types: Cigarettes    Quit date: 10/03/1983  . Smokeless tobacco: Never Used  . Alcohol use 0.0 oz/week     Comment: rarely     Colonoscopy:  PAP:  Bone density:  Lipid panel:  Allergies  Allergen Reactions  . Sulfa Antibiotics Itching and Hives  . Amoxicillin Hives  . Ampicillin Hives  . Keflex [Cephalexin] Itching  . Lisinopril Other (See Comments)  . Losartan Other (See Comments)  . Sulfamethoxazole-Trimethoprim Other (See Comments)  . Cephalosporins Hives    Current Outpatient Prescriptions  Medication Sig Dispense Refill  . aspirin EC 81 MG tablet Take 81 mg by mouth 2 (two) times daily.     Marland Kitchen BIOTIN PO Take by mouth.    . Cholecalciferol (VITAMIN D3) 1000 UNITS CAPS Take 2,000 Units by mouth daily.     . Coenzyme Q10 (CO Q-10) 200 MG CAPS Take 200 mg by mouth daily.     Marland Kitchen glucose blood (FREESTYLE LITE) test strip Use 2 (two) times daily. Free style lite test strips    . isosorbide mononitrate (IMDUR) 60 MG 24 hr tablet Take 1 tablet (60 mg total) by mouth daily. 30 tablet 0  . levothyroxine (SYNTHROID, LEVOTHROID) 100 MCG tablet Take 100 mcg by mouth every morning.    . lidocaine-prilocaine (EMLA) cream Apply 1 application topically as needed. Apply to port 1-2 hours prior to chemotherapy. Cover with plastic wrap. 30 g 2  . Lutein 40 MG CAPS Take 40 mg by mouth daily.     . Magnesium Oxide 250 MG TABS Take 1 tablet (250 mg total) by mouth daily.  0  . metFORMIN (GLUCOPHAGE) 1000 MG tablet Take 2,000 mg by mouth daily.    . metoprolol tartrate (LOPRESSOR) 25 MG tablet Take 12.5 mg by mouth 2 (two) times daily.     . Misc Natural Products (BEE PROPOLIS PO) Take 1 tablet by mouth 2 (two) times daily.     . Multiple Vitamins-Minerals (MULTI FOR HIM 50+ PO) Take 1 tablet by mouth daily.    . ondansetron (ZOFRAN) 8 MG tablet Take 1 tablet (8 mg total) by mouth every 8  (eight) hours as needed for nausea or vomiting. 30 tablet 2  . prochlorperazine (COMPAZINE) 10 MG tablet Take 1 tablet (10 mg total) by mouth every 6 (six) hours as needed for nausea or vomiting. 30 tablet 2  . Red Yeast Rice 600 MG TABS Take 2 tablets by mouth at bedtime.    . sucralfate (CARAFATE) 1 g tablet Take 1 tablet (1 g total) by mouth 3 (three) times daily. Dissolve each tablet in 2-3 tbsp warm water, swish and swallow. 90 tablet 2  . Wound Dressings (MEDIHONEY WOUND/BURN DRESSING) PSTE   0   No current facility-administered medications for this visit.    Facility-Administered Medications Ordered in Other Visits  Medication Dose Route Frequency Provider Last Rate Last Dose  . 0.9 %  sodium chloride infusion   Intravenous Continuous Lloyd Huger, MD  OBJECTIVE: Vitals:   04/10/16 0849  BP: 131/65  Pulse: 66  Resp: 18  Temp: (!) 96.6 F (35.9 C)     Body mass index is 20.67 kg/m.    ECOG FS:1 - Symptomatic but completely ambulatory  GENERAL:  Thin elderly gentleman sitting comfortably in the exam room in no acute distress. MENTAL STATUS:  Alert and oriented to person, place and time. HEAD:  Pearline Cables thin hair.  Male pattern baldness.  Normocephalic, atraumatic, face symmetric, no Cushingoid features. EYES:  Hazel eyes.  Pupils equal round and reactive to light and accomodation.  No conjunctivitis or scleral icterus. ENT:  Oropharynx clear without lesion.  Tongue normal. Mucous membranes moist.  RESPIRATORY:  Clear to auscultation without rales, wheezes or rhonchi. CARDIOVASCULAR:  Regular rate and rhythm without murmur, rub or gallop. ABDOMEN:  Soft, non-tender, with active bowel sounds, and no hepatosplenomegaly.  No masses. SKIN:  No rashes, ulcers or lesions.  Wrapping around right ankle (patient's wife declines removing dressing). EXTREMITIES: No edema, no skin discoloration or tenderness.  No palpable cords. LYMPH NODES: No palpable cervical, supraclavicular,  axillary or inguinal adenopathy  NEUROLOGICAL: Unremarkable.  Defers many questions to his wife. PSYCH:  Appropriate.    LAB RESULTS:  Lab Results  Component Value Date   NA 134 (L) 04/10/2016   K 3.6 04/10/2016   CL 98 (L) 04/10/2016   CO2 26 04/10/2016   GLUCOSE 127 (H) 04/10/2016   BUN 8 04/10/2016   CREATININE 0.60 (L) 04/10/2016   CALCIUM 9.0 04/10/2016   PROT 6.8 04/10/2016   ALBUMIN 3.8 04/10/2016   AST 42 (H) 04/10/2016   ALT 21 04/10/2016   ALKPHOS 125 04/10/2016   BILITOT 0.4 04/10/2016   GFRNONAA >60 04/10/2016   GFRAA >60 04/10/2016    Lab Results  Component Value Date   WBC 20.5 (H) 04/10/2016   NEUTROABS 18.6 (H) 04/10/2016   HGB 9.7 (L) 04/10/2016   HCT 29.2 (L) 04/10/2016   MCV 92.7 04/10/2016   PLT 377 04/10/2016   Lab Results  Component Value Date   IRON 31 (L) 12/22/2015   TIBC 400 12/22/2015   IRONPCTSAT 8 (L) 12/22/2015    Lab Results  Component Value Date   FERRITIN 32 12/22/2015   Lab Results  Component Value Date   CA199 25,156 (H) 03/27/2016   Lab Results  Component Value Date   CEA 372.7 (H) 03/27/2016     STUDIES: Nm Pet Image Restag (ps) Skull Base To Thigh  Result Date: 03/23/2016 CLINICAL DATA:  Subsequent treatment strategy for esophageal carcinoma with liver metastasis. Rising tumor markers. EXAM: NUCLEAR MEDICINE PET SKULL BASE TO THIGH TECHNIQUE: 12.8 mCi F-18 FDG was injected intravenously. Full-ring PET imaging was performed from the skull base to thigh after the radiotracer. CT data was obtained and used for attenuation correction and anatomic localization. FASTING BLOOD GLUCOSE:  Value: 84 mg/dl COMPARISON:  02/02/2016 FINDINGS: NECK No hypermetabolic lymph nodes in the neck. CHEST FDG uptake persists in the lower thoracic esophagus, with SUV measuring 5.5 compared to 5.2 previously. 1.4 cm right paratracheal lymph node is stable in size, and has SUV max of 3.9 on today's study compared to 3.1 previously. No new  hypermetabolic lymphadenopathy identified. Bilateral upper lobe prominent sub-cm pulmonary nodules show no significant change in size or number compared to previous study. Index nodule in peripheral right upper lobe measures 6 mm on image 87/3 and has SUV max of 1.3. These findings show no significant change compared to  previous study. Mild emphysema again noted. No evidence of pleural effusion. Aortic and coronary artery atherosclerosis. ABDOMEN/PELVIS Hypermetabolic lesion in the anterior right hepatic lobe has SUV max of 6.0 compared with 4.1 previously. Hypermetabolic lesion in the inferior right lobe has current SUV max of 6.2 compared to 3.0 previously. Several hypermetabolic right upper quadrant omental nodules are seen, largest measuring 9 mm on image 160/3, compared to 7 mm previously. This has current SUV max of 3.7 compared to 2.0 previously. Ill-defined soft tissue mass involving the celiac axis measures 5.8 x 4.2 cm, without significant change in size since previous study. This mass has SUV max of 7.3 compared to 4.3 previously. 18 mm portacaval lymph node showing peripheral calcification has SUV max of 8.8 on today's study, compared with 3.8 previously. 10 mm left inguinal lymph node has increased in size compared to 5 mm previously. This lymph node has SUV max of 6.7 on today's study, compared to 3.7 previously. Aortic atherosclerosis. Colonic diverticulosis again seen, without evidence of diverticulitis. SKELETON No focal hypermetabolic activity to suggest skeletal metastasis. IMPRESSION: Mild progression of hypermetabolic lymphadenopathy in right paratracheal region, celiac axis, portacaval space, and left inguinal region. Mild progression of small hypermetabolic liver metastases and omental soft tissue nodules in right upper quadrant. No significant change in tiny bilateral pulmonary metastases. Incidental findings including aortic and coronary atherosclerosis and colonic diverticulosis.  Electronically Signed   By: Earle Gell M.D.   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  PLAN:    1. 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 Patient's tumor markers are increasing. He received cycle 9 of FOLFOX plus Herceptin + Neulasta on 03/27/2016.  Discussions were held regarding a switch in treatment.  Discuss treatment with Cyramza and Taxol.  Side effects reviewed in detail.  Discuss treatment with Cyramza (ramucirimab) every 14 days and Taxol weekly x 3 with 1 week off.  Multiple questions were asked and answered.  Patient consented to treatment.  Day 1 of cycle #1 ramicirumab and Taxol today RTC in 1 week for MDassessment, labs (CBC with diff, CMP, Mg) and day 8 Taxol RTC in 2 weeks for labs (CBC with diff, CMP) and day 15 Ramicirumab and Taxol RTC in 4 weeks for MD assessment, labs (CBC with diff, CMP, Mg, CEA, CA19-9) and day 1 of cycle #2 ramicirumab and Taxol.  2. Iron deficiency anemia: Consider IV iron in the future. 3. Recent MI: Treatment per cardiology. MUGA scan on March 20, 2016 reported an EF of 52.8%, unchanged. 4. Weight loss: Weight was 150-160# last year with a nadir weight on 121# on 02/09/2016.  Weight slowly improving as he is eating well. 5. Dysphagia: XRT has been completed.  Patient denies any issues swallowing. 6. Leukocytosis secondary to Neulasta.  No evidence of infection. 7. Weakness and fatigue: Improving. 8. Ankle ulcer: Nonhealing, continue treatment per wound care.  Dressing intact.  Patient expressed understanding and was in agreement with this plan. He also understands that He can call clinic at any time with any questions, concerns, or complaints.    Lequita Asal, MD   04/10/2016 9:10 AM

## 2016-04-10 ENCOUNTER — Inpatient Hospital Stay: Payer: Medicare Other

## 2016-04-10 ENCOUNTER — Encounter: Payer: Self-pay | Admitting: Hematology and Oncology

## 2016-04-10 ENCOUNTER — Inpatient Hospital Stay (HOSPITAL_BASED_OUTPATIENT_CLINIC_OR_DEPARTMENT_OTHER): Payer: Medicare Other | Admitting: Hematology and Oncology

## 2016-04-10 VITALS — BP 131/65 | HR 66 | Temp 96.6°F | Resp 18 | Wt 131.9 lb

## 2016-04-10 DIAGNOSIS — D509 Iron deficiency anemia, unspecified: Secondary | ICD-10-CM

## 2016-04-10 DIAGNOSIS — C155 Malignant neoplasm of lower third of esophagus: Secondary | ICD-10-CM

## 2016-04-10 DIAGNOSIS — Z7689 Persons encountering health services in other specified circumstances: Secondary | ICD-10-CM | POA: Diagnosis not present

## 2016-04-10 DIAGNOSIS — Z5112 Encounter for antineoplastic immunotherapy: Secondary | ICD-10-CM | POA: Diagnosis not present

## 2016-04-10 DIAGNOSIS — Z5111 Encounter for antineoplastic chemotherapy: Secondary | ICD-10-CM

## 2016-04-10 DIAGNOSIS — Z923 Personal history of irradiation: Secondary | ICD-10-CM

## 2016-04-10 DIAGNOSIS — I252 Old myocardial infarction: Secondary | ICD-10-CM | POA: Diagnosis not present

## 2016-04-10 DIAGNOSIS — R634 Abnormal weight loss: Secondary | ICD-10-CM | POA: Diagnosis not present

## 2016-04-10 DIAGNOSIS — I119 Hypertensive heart disease without heart failure: Secondary | ICD-10-CM

## 2016-04-10 DIAGNOSIS — Z7984 Long term (current) use of oral hypoglycemic drugs: Secondary | ICD-10-CM

## 2016-04-10 DIAGNOSIS — R531 Weakness: Secondary | ICD-10-CM

## 2016-04-10 DIAGNOSIS — D72829 Elevated white blood cell count, unspecified: Secondary | ICD-10-CM

## 2016-04-10 DIAGNOSIS — Z79899 Other long term (current) drug therapy: Secondary | ICD-10-CM | POA: Diagnosis not present

## 2016-04-10 DIAGNOSIS — E1151 Type 2 diabetes mellitus with diabetic peripheral angiopathy without gangrene: Secondary | ICD-10-CM

## 2016-04-10 DIAGNOSIS — C787 Secondary malignant neoplasm of liver and intrahepatic bile duct: Secondary | ICD-10-CM | POA: Diagnosis not present

## 2016-04-10 DIAGNOSIS — Z8673 Personal history of transient ischemic attack (TIA), and cerebral infarction without residual deficits: Secondary | ICD-10-CM

## 2016-04-10 DIAGNOSIS — E039 Hypothyroidism, unspecified: Secondary | ICD-10-CM

## 2016-04-10 DIAGNOSIS — L97309 Non-pressure chronic ulcer of unspecified ankle with unspecified severity: Secondary | ICD-10-CM

## 2016-04-10 DIAGNOSIS — G4730 Sleep apnea, unspecified: Secondary | ICD-10-CM

## 2016-04-10 DIAGNOSIS — R1310 Dysphagia, unspecified: Secondary | ICD-10-CM

## 2016-04-10 DIAGNOSIS — Z7982 Long term (current) use of aspirin: Secondary | ICD-10-CM

## 2016-04-10 DIAGNOSIS — R5383 Other fatigue: Secondary | ICD-10-CM

## 2016-04-10 DIAGNOSIS — Z87891 Personal history of nicotine dependence: Secondary | ICD-10-CM

## 2016-04-10 DIAGNOSIS — K5900 Constipation, unspecified: Secondary | ICD-10-CM

## 2016-04-10 LAB — CBC WITH DIFFERENTIAL/PLATELET
Basophils Absolute: 0.1 10*3/uL (ref 0–0.1)
Basophils Relative: 0 %
EOS PCT: 1 %
Eosinophils Absolute: 0.1 10*3/uL (ref 0–0.7)
HEMATOCRIT: 29.2 % — AB (ref 40.0–52.0)
Hemoglobin: 9.7 g/dL — ABNORMAL LOW (ref 13.0–18.0)
LYMPHS ABS: 0.5 10*3/uL — AB (ref 1.0–3.6)
LYMPHS PCT: 2 %
MCH: 30.9 pg (ref 26.0–34.0)
MCHC: 33.3 g/dL (ref 32.0–36.0)
MCV: 92.7 fL (ref 80.0–100.0)
MONO ABS: 1.3 10*3/uL — AB (ref 0.2–1.0)
MONOS PCT: 6 %
NEUTROS ABS: 18.6 10*3/uL — AB (ref 1.4–6.5)
Neutrophils Relative %: 91 %
PLATELETS: 377 10*3/uL (ref 150–440)
RBC: 3.16 MIL/uL — ABNORMAL LOW (ref 4.40–5.90)
RDW: 19.5 % — AB (ref 11.5–14.5)
WBC: 20.5 10*3/uL — ABNORMAL HIGH (ref 3.8–10.6)

## 2016-04-10 LAB — COMPREHENSIVE METABOLIC PANEL
ALT: 21 U/L (ref 17–63)
AST: 42 U/L — AB (ref 15–41)
Albumin: 3.8 g/dL (ref 3.5–5.0)
Alkaline Phosphatase: 125 U/L (ref 38–126)
Anion gap: 10 (ref 5–15)
BILIRUBIN TOTAL: 0.4 mg/dL (ref 0.3–1.2)
BUN: 8 mg/dL (ref 6–20)
CHLORIDE: 98 mmol/L — AB (ref 101–111)
CO2: 26 mmol/L (ref 22–32)
CREATININE: 0.6 mg/dL — AB (ref 0.61–1.24)
Calcium: 9 mg/dL (ref 8.9–10.3)
Glucose, Bld: 127 mg/dL — ABNORMAL HIGH (ref 65–99)
POTASSIUM: 3.6 mmol/L (ref 3.5–5.1)
Sodium: 134 mmol/L — ABNORMAL LOW (ref 135–145)
TOTAL PROTEIN: 6.8 g/dL (ref 6.5–8.1)

## 2016-04-10 MED ORDER — DIPHENHYDRAMINE HCL 50 MG/ML IJ SOLN
25.0000 mg | Freq: Once | INTRAMUSCULAR | Status: AC
  Administered 2016-04-10: 25 mg via INTRAVENOUS
  Filled 2016-04-10: qty 1

## 2016-04-10 MED ORDER — SODIUM CHLORIDE 0.9 % IV SOLN
80.0000 mg/m2 | Freq: Once | INTRAVENOUS | Status: AC
  Administered 2016-04-10: 132 mg via INTRAVENOUS
  Filled 2016-04-10: qty 22

## 2016-04-10 MED ORDER — SODIUM CHLORIDE 0.9 % IV SOLN: 10.0000 mg | Freq: Once | INTRAVENOUS | Status: DC

## 2016-04-10 MED ORDER — FAMOTIDINE IN NACL 20-0.9 MG/50ML-% IV SOLN
20.0000 mg | Freq: Once | INTRAVENOUS | Status: AC
  Administered 2016-04-10: 20 mg via INTRAVENOUS
  Filled 2016-04-10: qty 50

## 2016-04-10 MED ORDER — HEPARIN SOD (PORK) LOCK FLUSH 100 UNIT/ML IV SOLN
500.0000 [IU] | Freq: Once | INTRAVENOUS | Status: AC | PRN
  Administered 2016-04-10: 500 [IU]

## 2016-04-10 MED ORDER — SODIUM CHLORIDE 0.9% FLUSH
10.0000 mL | INTRAVENOUS | Status: DC | PRN
  Administered 2016-04-10: 10 mL
  Filled 2016-04-10: qty 10

## 2016-04-10 MED ORDER — SODIUM CHLORIDE 0.9 % IV SOLN
Freq: Once | INTRAVENOUS | Status: AC
  Administered 2016-04-10: 10:00:00 via INTRAVENOUS
  Filled 2016-04-10: qty 1000

## 2016-04-10 MED ORDER — ACETAMINOPHEN 325 MG PO TABS
650.0000 mg | ORAL_TABLET | Freq: Once | ORAL | Status: AC
  Administered 2016-04-10: 650 mg via ORAL
  Filled 2016-04-10: qty 2

## 2016-04-10 MED ORDER — DEXAMETHASONE SODIUM PHOSPHATE 10 MG/ML IJ SOLN
10.0000 mg | Freq: Once | INTRAMUSCULAR | Status: AC
  Administered 2016-04-10: 10 mg via INTRAVENOUS
  Filled 2016-04-10: qty 1

## 2016-04-10 MED ORDER — SODIUM CHLORIDE 0.9 % IV SOLN
8.0000 mg/kg | Freq: Once | INTRAVENOUS | Status: AC
  Administered 2016-04-10: 500 mg via INTRAVENOUS
  Filled 2016-04-10: qty 50

## 2016-04-10 NOTE — Patient Instructions (Signed)
Paclitaxel injection What is this medicine? PACLITAXEL (PAK li TAX el) is a chemotherapy drug. It targets fast dividing cells, like cancer cells, and causes these cells to die. This medicine is used to treat ovarian cancer, breast cancer, and other cancers. This medicine may be used for other purposes; ask your health care provider or pharmacist if you have questions. COMMON BRAND NAME(S): Onxol, Taxol What should I tell my health care provider before I take this medicine? They need to know if you have any of these conditions: -blood disorders -irregular heartbeat -infection (especially a virus infection such as chickenpox, cold sores, or herpes) -liver disease -previous or ongoing radiation therapy -an unusual or allergic reaction to paclitaxel, alcohol, polyoxyethylated castor oil, other chemotherapy agents, other medicines, foods, dyes, or preservatives -pregnant or trying to get pregnant -breast-feeding How should I use this medicine? This drug is given as an infusion into a vein. It is administered in a hospital or clinic by a specially trained health care professional. Talk to your pediatrician regarding the use of this medicine in children. Special care may be needed. Overdosage: If you think you have taken too much of this medicine contact a poison control center or emergency room at once. NOTE: This medicine is only for you. Do not share this medicine with others. What if I miss a dose? It is important not to miss your dose. Call your doctor or health care professional if you are unable to keep an appointment. What may interact with this medicine? Do not take this medicine with any of the following medications: -disulfiram -metronidazole This medicine may also interact with the following medications: -cyclosporine -diazepam -ketoconazole -medicines to increase blood counts like filgrastim, pegfilgrastim, sargramostim -other chemotherapy drugs like cisplatin, doxorubicin,  epirubicin, etoposide, teniposide, vincristine -quinidine -testosterone -vaccines -verapamil Talk to your doctor or health care professional before taking any of these medicines: -acetaminophen -aspirin -ibuprofen -ketoprofen -naproxen This list may not describe all possible interactions. Give your health care provider a list of all the medicines, herbs, non-prescription drugs, or dietary supplements you use. Also tell them if you smoke, drink alcohol, or use illegal drugs. Some items may interact with your medicine. What should I watch for while using this medicine? Your condition will be monitored carefully while you are receiving this medicine. You will need important blood work done while you are taking this medicine. This medicine can cause serious allergic reactions. To reduce your risk you will need to take other medicine(s) before treatment with this medicine. If you experience allergic reactions like skin rash, itching or hives, swelling of the face, lips, or tongue, tell your doctor or health care professional right away. In some cases, you may be given additional medicines to help with side effects. Follow all directions for their use. This drug may make you feel generally unwell. This is not uncommon, as chemotherapy can affect healthy cells as well as cancer cells. Report any side effects. Continue your course of treatment even though you feel ill unless your doctor tells you to stop. Call your doctor or health care professional for advice if you get a fever, chills or sore throat, or other symptoms of a cold or flu. Do not treat yourself. This drug decreases your body's ability to fight infections. Try to avoid being around people who are sick. This medicine may increase your risk to bruise or bleed. Call your doctor or health care professional if you notice any unusual bleeding. Be careful brushing and flossing your teeth or   using a toothpick because you may get an infection or  bleed more easily. If you have any dental work done, tell your dentist you are receiving this medicine. Avoid taking products that contain aspirin, acetaminophen, ibuprofen, naproxen, or ketoprofen unless instructed by your doctor. These medicines may hide a fever. Do not become pregnant while taking this medicine. Women should inform their doctor if they wish to become pregnant or think they might be pregnant. There is a potential for serious side effects to an unborn child. Talk to your health care professional or pharmacist for more information. Do not breast-feed an infant while taking this medicine. Men are advised not to father a child while receiving this medicine. This product may contain alcohol. Ask your pharmacist or healthcare provider if this medicine contains alcohol. Be sure to tell all healthcare providers you are taking this medicine. Certain medicines, like metronidazole and disulfiram, can cause an unpleasant reaction when taken with alcohol. The reaction includes flushing, headache, nausea, vomiting, sweating, and increased thirst. The reaction can last from 30 minutes to several hours. What side effects may I notice from receiving this medicine? Side effects that you should report to your doctor or health care professional as soon as possible: -allergic reactions like skin rash, itching or hives, swelling of the face, lips, or tongue -low blood counts - This drug may decrease the number of white blood cells, red blood cells and platelets. You may be at increased risk for infections and bleeding. -signs of infection - fever or chills, cough, sore throat, pain or difficulty passing urine -signs of decreased platelets or bleeding - bruising, pinpoint red spots on the skin, black, tarry stools, nosebleeds -signs of decreased red blood cells - unusually weak or tired, fainting spells, lightheadedness -breathing problems -chest pain -high or low blood pressure -mouth sores -nausea and  vomiting -pain, swelling, redness or irritation at the injection site -pain, tingling, numbness in the hands or feet -slow or irregular heartbeat -swelling of the ankle, feet, hands Side effects that usually do not require medical attention (report to your doctor or health care professional if they continue or are bothersome): -bone pain -complete hair loss including hair on your head, underarms, pubic hair, eyebrows, and eyelashes -changes in the color of fingernails -diarrhea -loosening of the fingernails -loss of appetite -muscle or joint pain -red flush to skin -sweating This list may not describe all possible side effects. Call your doctor for medical advice about side effects. You may report side effects to FDA at 1-800-FDA-1088. Where should I keep my medicine? This drug is given in a hospital or clinic and will not be stored at home. NOTE: This sheet is a summary. It may not cover all possible information. If you have questions about this medicine, talk to your doctor, pharmacist, or health care provider.  2017 [employer] Standard (2014-12-01 19:58:00) Ramucirumab injection What is this medicine? RAMUCIRUMAB (ra mue SIR ue mab) is a monoclonal antibody. It is used to treat stomach cancer, colorectal cancer, or lung cancer. COMMON BRAND NAME(S): Cyramza What should I tell my health care provider before I take this medicine? They need to know if you have any of these conditions: -bleeding disorders -blood clots -heart disease, including heart failure, heart attack, or chest pain (angina) -high blood pressure -infection (especially a virus infection such as chickenpox, cold sores, or herpes) -protein in your urine -recent surgery -stroke -an unusual or allergic reaction to ramucirumab, other medicines, foods, dyes, or preservatives -pregnant or trying to  get pregnant -breast-feeding How should I use this medicine? This medicine is for infusion into a vein. It is given  by a health care professional in a hospital or clinic setting. Talk to your pediatrician regarding the use of this medicine in children. Special care may be needed. What if I miss a dose? It is important not to miss your dose. Call your doctor or health care professional if you are unable to keep an appointment. What may interact with this medicine? Interactions have not been studied. What should I watch for while using this medicine? Your condition will be monitored carefully while you are receiving this medicine. You will need to to check your blood pressure and have your blood and urine tested while you are taking this medicine. Your condition will be monitored carefully while you are receiving this medicine. This medicine may increase your risk to bruise or bleed. Call your doctor or health care professional if you notice any unusual bleeding. This medicine may rarely cause 'gastrointestinal perforation' (holes in the stomach, intestines or colon), a serious side effect requiring surgery to repair. This medicine should be started at least 28 days following major surgery and the site of the surgery should be totally healed. Check with your doctor before scheduling dental work or surgery while you are receiving this treatment. Talk to your doctor if you have recently had surgery or if you have a wound that has not healed. Do not become pregnant while taking this medicine or for 3 months after stopping it. Women should inform their doctor if they wish to become pregnant or think they might be pregnant. There is a potential for serious side effects to an unborn child. Talk to your health care professional or pharmacist for more information. What side effects may I notice from receiving this medicine? Side effects that you should report to your doctor or health care professional as soon as possible: -allergic reactions like skin rash, itching or hives, breathing problems, swelling of the face, lips, or  tongue -signs of infection - fever or chills, cough, sore throat -chest pain or chest tightness -confusion -dizziness -feeling faint or lightheaded, falls -severe abdominal pain -severe nausea, vomiting -signs and symptoms of bleeding such as bloody or black, tarry stools; red or dark-brown urine; spitting up blood or brown material that looks like coffee grounds; red spots on the skin; unusual bruising or bleeding from the eye, gums, or nose -signs and symptoms of a blood clot such as breathing problems; changes in vision; chest pain; severe, sudden headache; pain, swelling, warmth in the leg; trouble speaking; sudden numbness or weakness of the face, arm or leg -symptoms of a stroke: change in mental awareness, inability to talk or move one side of the body -trouble walking, dizziness, loss of balance or coordination Side effects that usually do not require medical attention (report to your doctor or health care professional if they continue or are bothersome): -cold, clammy skin -constipation -diarrhea -headache -nausea, vomiting -stomach pain -unusually slow heartbeat -unusually weak or tired Where should I keep my medicine? This drug is given in a hospital or clinic and will not be stored at home.  2017 [employer]/Gold Standard (2015-03-04 08:20:29)

## 2016-04-10 NOTE — Progress Notes (Signed)
Nutrition Assessment   Reason for Assessment:   Weight loss.  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  Past medical history of arthritis, DM, GI bleed, HTN, stroke, TIA, right ankle ulcer.  Patient seen during infusion with wife at chairside. Patient sleeping during most of visit.  Spoke with wife at length regarding patient intake.  Wife reports she has to encourage/force patient to eat.  Reports for breakfast usually has cereal with yogurt. For lunch has sandwich and fruit, then dinner meat and vegetables (soft meats).  Reports that if patient does not eat most of meals will have to drink glucerna.    Wife reports patient usually has a bowel movement everyday with help of miralax.    Nutrition Focused Physical Exam: deferred as patient sleeping  Medications:  Biotin, vit D3, coenzyme q10, lutein, Mag ox, MVI, zofran, compazine, carafate  Labs: Na 134, glucose 127, hgb 9.7  Anthropometrics:   Height: 67 inches Weight: 131 pounds today.  Patient is starting to gain weight at this time.   UBW: 160 pounds about 6-8 months ago per wife BMI: 20  18% weight loss in the last 6-8 months.     Estimated Energy Needs  Kcals: 1800-2100 kcals/d Protein: 71-89 g/d Fluid: 2.1 L/d  NUTRITION DIAGNOSIS: Unintentional weight loss related to cancer and cancer related treatments as evidenced by 18% weight loss in the last 6-8 months.   MALNUTRITION DIAGNOSIS: Patient meets criteria for severe malnutrition in context of chronic illness as evidenced by 18% weight loss in the last 6-8 months and eating < or equal to 75% of energy needs in > or equal to 1 month.   INTERVENTION:   Discussed ways to increase kcals and protein with wife.  Fact sheet given.  Encouraged oral nutrition supplement of 350 calories vs 220 or less calories even though contain increased carbohydrate and patient DM.  Patient needs additional calories to  maintain and/or increase weight at this time.  Recommend making adjustments in medication to controlled blood glucose at this time vs limiting oral nutrition supplement drinks.   Samples of boost and ensure products given.  Wife appreciative.    MONITORING, EVALUATION, GOAL: Patient will consume adequate calories and protein to maintain and or increase weight gain.   NEXT VISIT: March 26 during infusion  Cristobal Advani B. Zenia Resides, [place], [place] Registered Dietitian [phone] (pager)

## 2016-04-11 DIAGNOSIS — Z5112 Encounter for antineoplastic immunotherapy: Secondary | ICD-10-CM | POA: Insufficient documentation

## 2016-04-11 DIAGNOSIS — Z5111 Encounter for antineoplastic chemotherapy: Secondary | ICD-10-CM | POA: Insufficient documentation

## 2016-04-11 LAB — CANCER ANTIGEN 19-9: CA 19 9: 23612 U/mL — AB (ref 0–35)

## 2016-04-11 LAB — CEA: CEA: 404.1 ng/mL — AB (ref 0.0–4.7)

## 2016-04-12 ENCOUNTER — Inpatient Hospital Stay: Payer: Medicare Other

## 2016-04-14 ENCOUNTER — Encounter: Payer: Medicare Other | Attending: Surgery | Admitting: Surgery

## 2016-04-14 DIAGNOSIS — C155 Malignant neoplasm of lower third of esophagus: Secondary | ICD-10-CM | POA: Insufficient documentation

## 2016-04-14 DIAGNOSIS — E441 Mild protein-calorie malnutrition: Secondary | ICD-10-CM | POA: Diagnosis not present

## 2016-04-14 DIAGNOSIS — Z882 Allergy status to sulfonamides status: Secondary | ICD-10-CM | POA: Diagnosis not present

## 2016-04-14 DIAGNOSIS — L89512 Pressure ulcer of right ankle, stage 2: Secondary | ICD-10-CM | POA: Diagnosis not present

## 2016-04-14 DIAGNOSIS — E11621 Type 2 diabetes mellitus with foot ulcer: Secondary | ICD-10-CM | POA: Diagnosis not present

## 2016-04-14 DIAGNOSIS — D649 Anemia, unspecified: Secondary | ICD-10-CM | POA: Diagnosis not present

## 2016-04-14 DIAGNOSIS — L89510 Pressure ulcer of right ankle, unstageable: Secondary | ICD-10-CM | POA: Diagnosis not present

## 2016-04-14 DIAGNOSIS — G4730 Sleep apnea, unspecified: Secondary | ICD-10-CM | POA: Insufficient documentation

## 2016-04-14 DIAGNOSIS — I10 Essential (primary) hypertension: Secondary | ICD-10-CM | POA: Diagnosis not present

## 2016-04-14 DIAGNOSIS — Z880 Allergy status to penicillin: Secondary | ICD-10-CM | POA: Insufficient documentation

## 2016-04-15 NOTE — Progress Notes (Signed)
JASHAD, GANUS ([id number]) Visit Report for 04/14/2016 Allergy List Details Patient Name: [name], [name]. Date of Service: 04/14/2016 1:30 PM Medical Record Number: [id number] Patient Account Number: [id number] Date of Birth/Sex: September 30, 1940 (76 y.o. Male) Treating RN: Baruch Gouty, RN, BSN, Velva Harman Primary Care Elayna Tobler: LADA, Rip Harbour Other Clinician: Referring Sequoyah Counterman: LADA, [place] Treating Kristan Brummitt/Extender: Frann Rider in Treatment: 5 Allergies Active Allergies sufa drugs Cephalosporins lisinopril losartin Keflex amoxicillin ampicillin Septra Bactrim Allergy Notes Electronic Signature(s) Signed: 04/14/2016 3:07:45 PM By: Regan Lemming BSN, RN Entered By: Regan Lemming on 04/14/2016 13:54:04 [name] ([id number]) -------------------------------------------------------------------------------- [place] Details Patient Name: [name] Date of Service: 04/14/2016 1:30 PM Medical Record Number: [id number] Patient Account Number: [id number] Date of Birth/Sex: [birth year] (76 y.o. Male) Treating RN: Afful, RN, BSN, [place] Primary Care Moet Mikulski: LADA, [facility] Other Clinician: Referring Semya Klinke: LADA, [place] Treating Analise Glotfelty/Extender: Frann Rider in Treatment: 5 Visit Information Patient Arrived: Ambulatory Arrival Time: 13:42 Accompanied By: WIFE Transfer Assistance: None Patient Identification Verified: Yes Secondary Verification Process Yes Completed: Patient Requires Transmission- No Based Precautions: Patient Has Alerts: Yes Patient Alerts: Patient on Blood Thinner DM II 81mg  aspirin twice daily History Since Last Visit All ordered tests and consults were completed: No Added or deleted any medications: No Any new allergies or adverse reactions: No Had a fall or experienced change in activities of daily living that may affect risk of falls: No Signs or symptoms of abuse/neglect since last visito No Hospitalized since last visit: No Has  Dressing in Place as Prescribed: Yes Electronic Signature(s) Signed: 04/14/2016 3:07:45 PM By: Regan Lemming BSN, RN Entered By: Regan Lemming on 04/14/2016 13:42:25 [name] ([id number]) -------------------------------------------------------------------------------- Encounter Discharge Information Details Patient Name: [name] Date of Service: 04/14/2016 1:30 PM Medical Record Number: [id number] Patient Account Number: [id number] Date of Birth/Sex: [birth year] (76 y.o. Male) Treating RN: Baruch Gouty, RN, BSN, Velva Harman Primary Care Razia Screws: LADA, Rip Harbour Other Clinician: Referring Natalina Wieting: LADA, [place] Treating Vivien Barretto/Extender: Frann Rider in Treatment: 5 Encounter Discharge Information Items Discharge Pain Level: 0 Discharge Condition: Stable Ambulatory Status: Ambulatory Discharge Destination: Home Transportation: Private Auto Accompanied By: wife Schedule Follow-up Appointment: No Medication Reconciliation completed and provided to Patient/Care No Rivaan Kendall: Provided on Clinical Summary of Care: 04/14/2016 Form Type Recipient Paper Patient FW Electronic Signature(s) Signed: 04/14/2016 2:09:34 PM By: Sharon Mt Entered By: Sharon Mt on 04/14/2016 14:09:34 [name] ([id number]) -------------------------------------------------------------------------------- Lower Extremity Assessment Details Patient Name: [name] Date of Service: 04/14/2016 1:30 PM Medical Record Number: [id number] Patient Account Number: [id number] Date of Birth/Sex: Dec 29, 1940 (76 y.o. Male) Treating RN: Baruch Gouty, RN, BSN, Velva Harman Primary Care Arrion Burruel: LADA, Rip Harbour Other Clinician: Referring Stanislaus Kaltenbach: LADA, [place] Treating Yong Wahlquist/Extender: Frann Rider in Treatment: 5 Vascular Assessment Claudication: Claudication Assessment [Right:None] Pulses: Dorsalis Pedis Palpable: [Right:Yes] Posterior Tibial Extremity colors, hair growth, and conditions: Extremity Color:  [Right:Normal] Hair Growth on Extremity: [Right:No] Temperature of Extremity: [Right:Cool] Capillary Refill: [Right:< 3 seconds] Toe Nail Assessment Left: Right: Thick: Yes Discolored: Yes Electronic Signature(s) Signed: 04/14/2016 3:07:45 PM By: Regan Lemming BSN, RN Entered By: Regan Lemming on 04/14/2016 13:47:58 [name] ([id number]) -------------------------------------------------------------------------------- Multi Wound Chart Details Patient Name: [name] Date of Service: 04/14/2016 1:30 PM Medical Record Number: [id number] Patient Account Number: [id number] Date of Birth/Sex: April 18, 1940 (76 y.o. Male) Treating RN: Baruch Gouty, RN, BSN, Velva Harman Primary Care Zeinab Rodwell: LADA, Rip Harbour Other Clinician: Referring Tex Conroy: LADA, [place] Treating Yaslene Lindamood/Extender: Frann Rider in Treatment: 5 Vital Signs Height(in): 77 Pulse(bpm): 57 Weight(lbs): 130.45 Blood  Pressure 131/55 (mmHg): Body Mass Index(BMI): 20 Temperature(F): 98.1 Respiratory Rate 16 (breaths/min): Photos: [1:No Photos] [N/A:N/A] Wound Location: [1:Right Malleolus - Lateral] [N/A:N/A] Wounding Event: [1:Gradually Appeared] [N/A:N/A] Primary Etiology: [1:Pressure Ulcer] [N/A:N/A] Secondary Etiology: [1:Diabetic Wound/Ulcer of the Lower Extremity] [N/A:N/A] Comorbid History: [1:Cataracts, Chronic sinus problems/congestion, Anemia, Sleep Apnea, Hypertension, Myocardial Infarction, Type II Diabetes, Received Chemotherapy, Received Radiation] [N/A:N/A] Date Acquired: [1:01/09/2016] [N/A:N/A] Weeks of Treatment: [1:5] [N/A:N/A] Wound Status: [1:Open] [N/A:N/A] Measurements L x W x D 0.9x0.9x0.1 [N/A:N/A] (cm) Area (cm) : [1:0.636] [N/A:N/A] Volume (cm) : [1:0.064] [N/A:N/A] % Reduction in Area: [1:-169.50%] [N/A:N/A] % Reduction in Volume: -166.70% [N/A:N/A] Classification: [1:Unstageable/Unclassified] [N/A:N/A] HBO Classification: [1:Grade 1] [N/A:N/A] Exudate Amount: [1:Medium]  [N/A:N/A] Exudate Type: [1:Serous] [N/A:N/A] Exudate Color: [1:amber] [N/A:N/A] Wound Margin: [1:Flat and Intact] [N/A:N/A] Granulation Amount: [1:None Present (0%)] [N/A:N/A] Necrotic Amount: Large (67-100%) N/A N/A Exposed Structures: Fat Layer (Subcutaneous N/A N/A Tissue) Exposed: Yes Fascia: No Tendon: No Muscle: No Joint: No Bone: No Epithelialization: None N/A N/A Debridement: Debridement [id number] N/A N/A [id number]) Pre-procedure 13:54 N/A N/A Verification/Time Out Taken: Pain Control: Lidocaine 4% Topical N/A N/A Solution Tissue Debrided: Fibrin/Slough, N/A N/A Subcutaneous Level: Skin/Subcutaneous N/A N/A Tissue Debridement Area (sq 0.81 N/A N/A cm): Instrument: Curette N/A N/A Bleeding: Minimum N/A N/A Hemostasis Achieved: Pressure N/A N/A Procedural Pain: 0 N/A N/A Post Procedural Pain: 0 N/A N/A Debridement Treatment Procedure was tolerated N/A N/A Response: well Post Debridement 0.9x0.9x0.1 N/A N/A Measurements L x W x D (cm) Post Debridement 0.064 N/A N/A Volume: (cm) Post Debridement Unstageable/Unclassified N/A N/A Stage: Periwound Skin Texture: Induration: Yes N/A N/A Periwound Skin Maceration: No N/A N/A Moisture: Dry/Scaly: No Periwound Skin Color: Erythema: Yes N/A N/A Erythema Location: Circumferential N/A N/A Erythema Change: Decreased N/A N/A Temperature: No Abnormality N/A N/A Tenderness on Yes N/A N/A Palpation: Wound Preparation: Ulcer Cleansing: N/A N/A Rinsed/Irrigated with Saline Topical Anesthetic JAQUAIN, APPLE[place]ATE ([id number]) Applied: Other: lidociane 4% Procedures Performed: Debridement N/A N/A Treatment Notes Wound #1 (Right, Lateral Malleolus) 1. Cleansed with: Clean wound with Normal Saline 4. Dressing Applied: Santyl Ointment 5. Secondary Dressing Applied Bordered Foam Dressing Dry Gauze Electronic Signature(s) Signed: 04/14/2016 2:04:33 PM By: Christin Fudge MD, FACS Entered By: Christin Fudge on 04/14/2016  14:04:33 [name] ([id number]) -------------------------------------------------------------------------------- [place] Details Patient Name: DAY, GELVIN Date of Service: 04/14/2016 1:30 PM Medical Record Number: [id number] Patient Account Number: [id number] Date of Birth/Sex: [birth year] (76 y.o. Male) Treating RN: Baruch Gouty, RN, BSN, Velva Harman Primary Care Shamara Soza: LADA, Rip Harbour Other Clinician: Referring Yurem Viner: LADA, [place] Treating Briah Nary/Extender: Frann Rider in Treatment: 5 Active Inactive ` Orientation to the Wound Care Program Nursing Diagnoses: Knowledge deficit related to the wound healing center program Goals: Patient/caregiver will verbalize understanding of the [facility] Date Initiated: 03/10/2016 Target Resolution Date: 03/23/2016 Goal Status: Active Interventions: Provide education on orientation to the wound center Notes: ` Soft Tissue Infection Nursing Diagnoses: Impaired tissue integrity Potential for infection: soft tissue Goals: Patient will remain free of wound infection Date Initiated: 03/10/2016 Target Resolution Date: 03/17/2016 Goal Status: Active Interventions: Assess signs and symptoms of infection every visit Notes: ` Wound/Skin Impairment Nursing Diagnoses: Impaired tissue integrity TRAMAYNE, BOURGEOIS ([id number]) Goals: Ulcer/skin breakdown will heal within 14 weeks Date Initiated: 03/10/2016 Target Resolution Date: 03/24/2016 Goal Status: Active Interventions: Assess patient/caregiver ability to obtain necessary supplies Notes: Electronic Signature(s) Signed: 04/14/2016 3:07:45 PM By: Regan Lemming BSN, RN Entered By: Regan Lemming on 04/14/2016 13:51:34 [name] ([id number]) -------------------------------------------------------------------------------- Pain Assessment  Details Patient Name: ISTVAN, WACHTEL. Date of Service: 04/14/2016 1:30 PM Medical Record Number: [id number] Patient  Account Number: [id number] Date of Birth/Sex: 05-29-1940 (76 y.o. Male) Treating RN: Baruch Gouty, RN, BSN, Velva Harman Primary Care Rashema Seawright: LADA, Rip Harbour Other Clinician: Referring Kimaya Whitlatch: LADA, [place] Treating Marly Schuld/Extender: Frann Rider in Treatment: 5 Active Problems Location of Pain Severity and Description of Pain Patient Has Paino No Site Locations With Dressing Change: No Pain Management and Medication Current Pain Management: Electronic Signature(s) Signed: 04/14/2016 3:07:45 PM By: Regan Lemming BSN, RN Entered By: Regan Lemming on 04/14/2016 13:42:33 [name] ([id number]) -------------------------------------------------------------------------------- Patient/Caregiver Education Details Patient Name: [name] Date of Service: 04/14/2016 1:30 PM Medical Record Number: [id number] Patient Account Number: [id number] Date of Birth/Gender: Feb 04, 1941 (76 y.o. Male) Treating RN: Baruch Gouty, RN, BSN, Velva Harman Primary Care Physician: LADA, Rip Harbour Other Clinician: Referring Physician: LADA, [place] Treating Physician/Extender: Frann Rider in Treatment: 5 Education Assessment Education Provided To: Patient Education Topics Provided Welcome To [facility]: Methods: Explain/Verbal Responses: State content correctly Wound Debridement: Methods: Explain/Verbal Responses: State content correctly Wound/Skin Impairment: Methods: Explain/Verbal Responses: State content correctly Electronic Signature(s) Signed: 04/14/2016 3:07:45 PM By: Regan Lemming BSN, RN Entered By: Regan Lemming on 04/14/2016 13:58:50 [name] ([id number]) -------------------------------------------------------------------------------- Wound Assessment Details Patient Name: [name] Date of Service: 04/14/2016 1:30 PM Medical Record Number: [id number] Patient Account Number: [id number] Date of Birth/Sex: May 16, 1940 (76 y.o. Male) Treating RN: Afful, RN, BSN, [place] Primary Care  Jaquanda Wickersham: LADA, Rip Harbour Other Clinician: Referring Vasilios Ottaway: LADA, [place] Treating Skiler Olden/Extender: Frann Rider in Treatment: 5 Wound Status Wound Number: 1 Primary Pressure Ulcer Etiology: Wound Location: Right Malleolus - Lateral Secondary Diabetic Wound/Ulcer of the Lower Wounding Event: Gradually Appeared Etiology: Extremity Date Acquired: 01/09/2016 Wound Open Weeks Of Treatment: 5 Status: Clustered Wound: No Comorbid Cataracts, Chronic sinus History: problems/congestion, Anemia, Sleep Apnea, Hypertension, Myocardial Infarction, Type II Diabetes, Received Chemotherapy, Received Radiation Photos Photo Uploaded By: Regan Lemming on 04/14/2016 15:07:32 Wound Measurements Length: (cm) 0.9 Width: (cm) 0.9 Depth: (cm) 0.1 Area: (cm) 0.636 Volume: (cm) 0.064 % Reduction in Area: -169.5% % Reduction in Volume: -166.7% Epithelialization: None Tunneling: No Undermining: No Wound Description Classification: Unstageable/Unclassified Foul Od Diabetic Severity (Wagner): Grade 1 Slough/ Wound Margin: Flat and Intact Exudate Amount: Medium Exudate Type: Serous Exudate Color: amber NENAD, TRETO ([id number]) or After Cleansing: No Fibrino Yes Wound Bed Granulation Amount: None Present (0%) Exposed Structure Necrotic Amount: Large (67-100%) Fascia Exposed: No Necrotic Quality: Adherent Slough Fat Layer (Subcutaneous Tissue) Exposed: Yes Tendon Exposed: No Muscle Exposed: No Joint Exposed: No Bone Exposed: No Periwound Skin Texture Texture Color No Abnormalities Noted: No No Abnormalities Noted: No Induration: Yes Erythema: Yes Erythema Location: Circumferential Moisture Erythema Change: Decreased No Abnormalities Noted: No Dry / Scaly: No Temperature / Pain Maceration: No Temperature: No Abnormality Tenderness on Palpation: Yes Wound Preparation Ulcer Cleansing: Rinsed/Irrigated with Saline Topical Anesthetic Applied: Other: lidociane  4%, Treatment Notes Wound #1 (Right, Lateral Malleolus) 1. Cleansed with: Clean wound with Normal Saline 4. Dressing Applied: Santyl Ointment 5. Secondary Dressing Applied Bordered Foam Dressing Dry Gauze Electronic Signature(s) Signed: 04/14/2016 3:07:45 PM By: Regan Lemming BSN, RN Entered By: Regan Lemming on 04/14/2016 13:50:44 [name] ([id number]) -------------------------------------------------------------------------------- [place] Details Patient Name: [name] Date of Service: 04/14/2016 1:30 PM Medical Record Number: [id number] Patient Account Number: [id number] Date of Birth/Sex: January 10, 1941 (76 y.o. Male) Treating RN: Baruch Gouty, RN, BSN, Velva Harman Primary Care Jamine Highfill: [place] Other Clinician: Referring Harriet Bollen:  LADA, [place] Treating [place] Barua/Extender: Frann Rider in Treatment: 5 Vital Signs Time Taken: 13:50 Temperature (F): 98.1 Height (in): 67 Pulse (bpm): 73 Weight (lbs): 130.45 Respiratory Rate (breaths/min): 16 Body Mass Index (BMI): 20.4 Blood Pressure (mmHg): 131/55 Reference Range: 80 - 120 mg / dl Electronic Signature(s) Signed: 04/14/2016 3:07:45 PM By: Regan Lemming BSN, RN Entered By: Regan Lemming on 04/14/2016 13:51:27

## 2016-04-15 NOTE — Progress Notes (Signed)
[name], [name] ([id number]) Visit Report for 04/14/2016 Chief Complaint Document Details Patient Name: [name], [name]. Date of Service: 04/14/2016 1:30 PM Medical Record Number: [id number] Patient Account Number: [id number] Date of Birth/Sex: Aug 06, 1940 (76 y.o. Male) Treating RN: Baruch Gouty, RN, BSN, Velva Harman Primary Care Provider: LADA, Rip Harbour Other Clinician: Referring Provider: LADA, [place] Treating Provider/Extender: Frann Rider in Treatment: 5 Information Obtained from: Patient Chief Complaint Patient is at the clinic for treatment of an open pressure ulcer to the right lateral ankle which she's had for about 2 months Electronic Signature(s) Signed: 04/14/2016 2:04:46 PM By: Christin Fudge MD, FACS Entered By: Christin Fudge on 04/14/2016 14:04:46 [name] ([id number]) -------------------------------------------------------------------------------- Debridement Details Patient Name: [name] Date of Service: 04/14/2016 1:30 PM Medical Record Number: [id number] Patient Account Number: [id number] Date of Birth/Sex: [birth year] (76 y.o. Male) Treating RN: Afful, RN, BSN, [place] Primary Care Provider: LADA, Rip Harbour Other Clinician: Referring Provider: LADA, [place] Treating Provider/Extender: Frann Rider in Treatment: 5 Debridement Performed for Wound #1 Right,Lateral Malleolus Assessment: Performed By: Physician Christin Fudge, MD Debridement: Debridement Pre-procedure Yes - 13:54 Verification/Time Out Taken: Start Time: 13:54 Pain Control: Lidocaine 4% Topical Solution Level: Skin/Subcutaneous Tissue Total Area Debrided (L x 0.9 (cm) x 0.9 (cm) = 0.81 (cm) W): Tissue and other Non-Viable, Fibrin/Slough, Subcutaneous material debrided: Instrument: Curette Bleeding: Minimum Hemostasis Achieved: Pressure End Time: 13:58 Procedural Pain: 0 Post Procedural Pain: 0 Response to Treatment: Procedure was tolerated well Post Debridement Measurements of Total  Wound Length: (cm) 0.9 Stage: Unstageable/Unclassified Width: (cm) 0.9 Depth: (cm) 0.1 Volume: (cm) 0.064 Character of Wound/Ulcer Post Requires Further Debridement: Debridement Severity of Tissue Post Fat layer exposed Debridement: Post Procedure Diagnosis Same as Pre-procedure Electronic Signature(s) Signed: 04/14/2016 2:04:39 PM By: Christin Fudge MD, FACS Signed: 04/14/2016 3:07:45 PM By: Regan Lemming BSN, RN Entered By: Christin Fudge on 04/14/2016 14:04:39 [name], [name] ([id number], [place] ([id number]) -------------------------------------------------------------------------------- HPI Details Patient Name: [name], [name]. Date of Service: 04/14/2016 1:30 PM Medical Record Number: [id number] Patient Account Number: [id number] Date of Birth/Sex: 1940/11/07 (76 y.o. Male) Treating RN: Baruch Gouty, RN, BSN, Velva Harman Primary Care Provider: LADA, Rip Harbour Other Clinician: Referring Provider: LADA, [place] Treating Provider/Extender: Frann Rider in Treatment: 5 History of Present Illness Location: Patient presents with an ulcer on the right lateral ankle. Quality: Patient reports experiencing a dull pain to affected area(s). Severity: Patient states wound are getting worse. Duration: Patient has had the wound for > 2 months prior to seeking treatment at the wound center Timing: Pain in wound is Intermittent (comes and goes Context: The wound appeared gradually over time Modifying Factors: Other treatment(s) tried include:local care as per the medical oncologist Associated Signs and Symptoms: Patient reports having:some pain and no discharge HPI Description: 76 year old patient has been referred by his medical oncologist Dr. Grayland Ormond, for a right lateral malleolus ulcer has been there for several weeks. 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 He is currently on chemotherapy with FOLFOX and Herceptin. past medical  history significant for diabetes mellitus, anemia, hypertension, sleep apnea, status post appendectomy, peripheral vascular catheterization( Port placement) in September 2017 by Dr. Delana Meyer. He is also receiving palliative radiation therapy and was seen by Dr. Donella Stade. 03/17/2016 -- - x-ray of the right ankle -- IMPRESSION: 1. Soft tissue wound noted over the lateral malleolus, no underlying bony abnormality. No acute bony abnormality. 2. Diffuse degenerative change. 3. Peripheral vascular disease. Electronic Signature(s) Signed: 04/14/2016  2:04:57 PM By: Christin Fudge MD, FACS Entered By: Christin Fudge on 04/14/2016 14:04:56 [name] ([id number]) -------------------------------------------------------------------------------- Physical Exam Details Patient Name: [name], [name] Date of Service: 04/14/2016 1:30 PM Medical Record Number: [id number] Patient Account Number: [id number] Date of Birth/Sex: 1940/08/05 (76 y.o. Male) Treating RN: Baruch Gouty, RN, BSN, Velva Harman Primary Care Provider: LADA, Rip Harbour Other Clinician: Referring Provider: LADA, [place] Treating Provider/Extender: Frann Rider in Treatment: 5 Constitutional . Pulse regular. Respirations normal and unlabored. Afebrile. . Eyes Nonicteric. Reactive to light. Ears, Nose, Mouth, and Throat Lips, teeth, and gums WNL.Marland Kitchen Moist mucosa without lesions. Neck supple and nontender. No palpable supraclavicular or cervical adenopathy. Normal sized without goiter. Respiratory WNL. No retractions.. Cardiovascular Pedal Pulses WNL. No clubbing, cyanosis or edema. Chest Breasts symmetical and no nipple discharge.. Breast tissue WNL, no masses, lumps, or tenderness.. Lymphatic No adneopathy. No adenopathy. No adenopathy. Musculoskeletal Adexa without tenderness or enlargement.. Digits and nails w/o clubbing, cyanosis, infection, petechiae, ischemia, or inflammatory conditions.. Integumentary (Hair, Skin) No suspicious lesions.  No crepitus or fluctuance. No peri-wound warmth or erythema. No masses.Marland Kitchen Psychiatric Judgement and insight Intact.. No evidence of depression, anxiety, or agitation.. Notes using a #3 curet I have sharply removed some of the subcutaneous and his debris and minimal bleeding controlled with pressure Electronic Signature(s) Signed: 04/14/2016 2:05:31 PM By: Christin Fudge MD, FACS Entered By: Christin Fudge on 04/14/2016 14:05:31 [name] ([id number]) -------------------------------------------------------------------------------- Physician Orders Details Patient Name: [name] Date of Service: 04/14/2016 1:30 PM Medical Record Number: [id number] Patient Account Number: [id number] Date of Birth/Sex: 02-25-1940 (76 y.o. Male) Treating RN: Baruch Gouty, RN, BSN, Velva Harman Primary Care Provider: LADA, Rip Harbour Other Clinician: Referring Provider: LADA, [place] Treating Provider/Extender: Frann Rider in Treatment: 5 Verbal / Phone Orders: No Diagnosis Coding Wound Cleansing Wound #1 Right,Lateral Malleolus o Clean wound with Normal Saline. o Cleanse wound with mild soap and water Anesthetic Wound #1 Right,Lateral Malleolus o Topical Lidocaine 4% cream applied to wound bed prior to debridement Primary Wound Dressing Wound #1 Right,Lateral Malleolus o Santyl Ointment - Or Manuka Honey Secondary Dressing Wound #1 Right,Lateral Malleolus o Dry Gauze o Boardered Foam Dressing Dressing Change Frequency Wound #1 Right,Lateral Malleolus o Change dressing every day. Follow-up Appointments Wound #1 Right,Lateral Malleolus o Return Appointment in 1 week. Off-Loading Wound #1 Right,Lateral Malleolus o Other: - Keep pressure off the malleolus Additional Orders / Instructions Wound #1 Right,Lateral Malleolus o Increase protein intake. o Activity as tolerated [name], [name] ([id number]) Medications-please add to medication list. Wound #1 Right,Lateral  Malleolus o Other: - Include these in your diet...VITAMIN A, C, ZINC, MVI Electronic Signature(s) Signed: 04/14/2016 3:07:45 PM By: Regan Lemming BSN, RN Signed: 04/14/2016 3:38:42 PM By: Christin Fudge MD, FACS Entered By: Regan Lemming on 04/14/2016 13:57:24 [name] ([id number]) -------------------------------------------------------------------------------- Problem List Details Patient Name: [name], [name] Date of Service: 04/14/2016 1:30 PM Medical Record Number: [id number] Patient Account Number: [id number] Date of Birth/Sex: November 24, 1940 (76 y.o. Male) Treating RN: Primary Care Provider: LADA, Rip Harbour Other Clinician: Referring Provider: LADA, [place] Treating Provider/Extender: [place] in Treatment: 5 Active Problems Inactive Problems Resolved Problems Electronic Signature(s) Signed: 04/14/2016 2:04:30 PM By: Christin Fudge MD, FACS Entered By: Christin Fudge on 04/14/2016 14:04:30 [name] ([id number]) -------------------------------------------------------------------------------- Progress Note Details Patient Name: [name] Date of Service: 04/14/2016 1:30 PM Medical Record Number: [id number] Patient Account Number: [id number] Date of Birth/Sex: 16-Mar-1940 (76 y.o. Male) Treating RN: Baruch Gouty, RN, BSN, Velva Harman Primary Care Provider: LADA, Rip Harbour Other Clinician: Referring  Provider: LADA, [place] Treating Provider/Extender: Frann Rider in Treatment: 5 Subjective Chief Complaint Information obtained from Patient Patient is at the clinic for treatment of an open pressure ulcer to the right lateral ankle which she's had for about 2 months History of Present Illness (HPI) The following HPI elements were documented for the patient's wound: Location: Patient presents with an ulcer on the right lateral ankle. Quality: Patient reports experiencing a dull pain to affected area(s). Severity: Patient states wound are getting worse. Duration: Patient has had the  wound for > 2 months prior to seeking treatment at the wound center Timing: Pain in wound is Intermittent (comes and goes Context: The wound appeared gradually over time Modifying Factors: Other treatment(s) tried include:local care as per the medical oncologist Associated Signs and Symptoms: Patient reports having:some pain and no discharge 76 year old patient has been referred by his medical oncologist Dr. Grayland Ormond, for a right lateral malleolus ulcer has been there for several weeks. 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 He is currently on chemotherapy with FOLFOX and Herceptin. past medical history significant for diabetes mellitus, anemia, hypertension, sleep apnea, status post appendectomy, peripheral vascular catheterization( Port placement) in September 2017 by Dr. Delana Meyer. He is also receiving palliative radiation therapy and was seen by Dr. Donella Stade. 03/17/2016 -- - x-ray of the right ankle -- IMPRESSION: 1. Soft tissue wound noted over the lateral malleolus, no underlying bony abnormality. No acute bony abnormality. 2. Diffuse degenerative change. 3. Peripheral vascular disease. Allergies sufa drugs, Cephalosporins, lisinopril, losartin, Keflex, amoxicillin, ampicillin, Septra, Bactrim Objective [name], [name] ([id number]) Constitutional Pulse regular. Respirations normal and unlabored. Afebrile. Vitals Time Taken: 1:50 PM, Height: 67 in, Weight: 130.45 lbs, BMI: 20.4, Temperature: 98.1 F, Pulse: 73 bpm, Respiratory Rate: 16 breaths/min, Blood Pressure: 131/55 mmHg. Eyes Nonicteric. Reactive to light. Ears, Nose, Mouth, and Throat Lips, teeth, and gums WNL.Marland Kitchen Moist mucosa without lesions. Neck supple and nontender. No palpable supraclavicular or cervical adenopathy. Normal sized without goiter. Respiratory WNL. No retractions.. Cardiovascular Pedal Pulses WNL. No clubbing, cyanosis or edema. Chest Breasts  symmetical and no nipple discharge.. Breast tissue WNL, no masses, lumps, or tenderness.. Lymphatic No adneopathy. No adenopathy. No adenopathy. Musculoskeletal Adexa without tenderness or enlargement.. Digits and nails w/o clubbing, cyanosis, infection, petechiae, ischemia, or inflammatory conditions.Marland Kitchen Psychiatric Judgement and insight Intact.. No evidence of depression, anxiety, or agitation.. General Notes: using a #3 curet I have sharply removed some of the subcutaneous and his debris and minimal bleeding controlled with pressure Integumentary (Hair, Skin) No suspicious lesions. No crepitus or fluctuance. No peri-wound warmth or erythema. No masses.. Wound #1 status is Open. Original cause of wound was Gradually Appeared. The wound is located on the Right,Lateral Malleolus. The wound measures 0.9cm length x 0.9cm width x 0.1cm depth; 0.636cm^2 area and 0.064cm^3 volume. There is Fat Layer (Subcutaneous Tissue) Exposed exposed. There is no tunneling or undermining noted. There is a medium amount of serous drainage noted. The wound margin is flat and intact. There is no granulation within the wound bed. There is a large (67-100%) amount of necrotic tissue within the wound bed including Adherent Slough. The periwound skin appearance exhibited: Induration, Erythema. The periwound skin appearance did not exhibit: Dry/Scaly, Maceration. The surrounding wound skin color is noted with erythema which is circumferential. Periwound temperature was noted as No [name], [name] ([id number]) Abnormality. The periwound has tenderness on palpation. Assessment Procedures Wound #1 Wound #1 is a Pressure  Ulcer located on the Right,Lateral Malleolus . There was a Skin/Subcutaneous Tissue Debridement [id number]) debridement with total area of 0.81 sq cm performed by Christin Fudge, MD. with the following instrument(s): Curette to remove Non-Viable tissue/material including Fibrin/Slough and Subcutaneous  after achieving pain control using Lidocaine 4% Topical Solution. A time out was conducted at 13:54, prior to the start of the procedure. A Minimum amount of bleeding was controlled with Pressure. The procedure was tolerated well with a pain level of 0 throughout and a pain level of 0 following the procedure. Post Debridement Measurements: 0.9cm length x 0.9cm width x 0.1cm depth; 0.064cm^3 volume. Post debridement Stage noted as Unstageable/Unclassified. Character of Wound/Ulcer Post Debridement requires further debridement. Severity of Tissue Post Debridement is: Fat layer exposed. Post procedure Diagnosis Wound #1: Same as Pre-Procedure Plan Wound Cleansing: Wound #1 Right,Lateral Malleolus: Clean wound with Normal Saline. Cleanse wound with mild soap and water Anesthetic: Wound #1 Right,Lateral Malleolus: Topical Lidocaine 4% cream applied to wound bed prior to debridement Primary Wound Dressing: Wound #1 Right,Lateral Malleolus: Santyl Ointment - Or Manuka Honey Secondary Dressing: Wound #1 Right,Lateral Malleolus: Dry Gauze [name], [name] ([id number]) Boardered Foam Dressing Dressing Change Frequency: Wound #1 Right,Lateral Malleolus: Change dressing every day. Follow-up Appointments: Wound #1 Right,Lateral Malleolus: Return Appointment in 1 week. Off-Loading: Wound #1 Right,Lateral Malleolus: Other: - Keep pressure off the malleolus Additional Orders / Instructions: Wound #1 Right,Lateral Malleolus: Increase protein intake. Activity as tolerated Medications-please add to medication list.: Wound #1 Right,Lateral Malleolus: Other: - Include these in your diet...VITAMIN A, C, ZINC, MVI I have recommended: 1. Daily dressing with Medihoney to be used (Santyl ointment was not affordable) . 2. Offloading has been discussed as much as possible in great detail 3. Adequate protein, vitamin A, vitamin C and zinc. 4. regular visits to the wound center the patient's wife has  had all questions answered and will be compliant Electronic Signature(s) Signed: 04/14/2016 2:06:32 PM By: Christin Fudge MD, FACS Entered By: Christin Fudge on 04/14/2016 14:06:31 [name] ([id number]) -------------------------------------------------------------------------------- [place] Details Patient Name: [name] Date of Service: 04/14/2016 Medical Record Number: [id number] Patient Account Number: [id number] Date of Birth/Sex: 08-16-1940 (76 y.o. Male) Treating RN: Baruch Gouty, RN, BSN, [place] Primary Care Provider: LADA, Rip Harbour Other Clinician: Referring Provider: LADA, [place] Treating Provider/Extender: Christin Fudge Service Line: Outpatient Weeks in Treatment: 5 Diagnosis Coding ICD-10 Codes Code Description E11.621 Type 2 diabetes mellitus with foot ulcer L89.512 Pressure ulcer of right ankle, stage 2 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 C15.5 Malignant neoplasm of lower third of esophagus Facility Procedures CPT4 Code: [id number] Description: [id number] - DEB SUBQ TISSUE 20 SQ CM/< ICD-10 Description Diagnosis E11.621 Type 2 diabetes mellitus with foot ulcer L89.512 Pressure ulcer of right ankle, stage 2 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

## 2016-04-16 LAB — HM DIABETES EYE EXAM

## 2016-04-16 NOTE — Progress Notes (Signed)
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  INTERVAL HISTORY:  Patient returns to clinic today for further evaluation and consideration of cycle 1, day 8 of Cyramza and Taxol. Taxol only today. He tolerated his first treatment well without significant side effects. He currently feels well and is asymptomatic. He continues to have improved ability to swallow without dysphasia. He has a good appetite. He has an occasional nosebleed that usually resolves without intervention. She has a mild peripheral neuropathy. He has no other neurologic complaints. He denies any recent fevers or illnesses. He denies any chest pain or shortness of breath. He denies any nausea, vomiting, constipation, or diarrhea. He has no urinary complaints. Patient otherwise feels well and offers no further specific complaints.  REVIEW OF SYSTEMS:   Review of Systems  Constitutional: Negative for fever, malaise/fatigue and weight loss.  Respiratory: Negative.  Negative for shortness of breath.   Cardiovascular: Negative.  Negative for chest pain and leg swelling.  Gastrointestinal: Negative.  Negative for abdominal pain, blood in stool and melena.  Genitourinary: Negative.   Musculoskeletal: Negative.   Neurological: Positive for sensory change. Negative for weakness.  Psychiatric/Behavioral: Negative.  The patient is not nervous/anxious.     As per HPI. Otherwise, a complete review of systems is negative.  PAST MEDICAL HISTORY: Past Medical History:    Diagnosis Date  . Anemia   . Arthritis   . 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 Diabetes mellitus without complication ([place])   . Ectatic abdominal aorta ([place]) 08/12/2015  . Esophageal cancer ([place])   . GI bleed   . Hearing loss    left ear  . Hypertension   . Hypothyroidism   . Liver mass   . Mild left atrial enlargement 08/12/2015   Very mild; LA 4.2 cm; echo March 2017  . Mitral valvular regurgitation 08/12/2015  . Sleep apnea   . Stroke ([place])   . TIA (transient ischemic attack)   . 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 APPENDECTOMY    . EUS N/A 10/07/2015   Procedure: FULL UPPER ENDOSCOPIC ULTRASOUND (EUS) RADIAL;  Surgeon: Holly Bodily, MD;  Location: [facility] ENDOSCOPY;  Service: Endoscopy;  Laterality: N/A;  . EYE SURGERY    . FRACTURE SURGERY    . NOSE SURGERY    . PERIPHERAL VASCULAR CATHETERIZATION N/A 10/26/2015   Procedure: Glori Luis Cath Insertion;  Surgeon: Katha Cabal, MD;  Location: [place] [facility];  Service: Cardiovascular;  Laterality: N/A;    FAMILY HISTORY Family History  Problem Relation Age of Onset  . Hypertension Mother   . Lung cancer Father   . Hypertension Brother        ADVANCED DIRECTIVES:    HEALTH MAINTENANCE: Social History  Substance Use Topics  . Smoking status: Former Smoker    Packs/day: 1.00    Types: Cigarettes    Quit date: 10/03/1983  . Smokeless tobacco: Never Used  . Alcohol use 0.0 oz/week  Comment: rarely     Colonoscopy:  PAP:  Bone density:  Lipid panel:  Allergies  Allergen Reactions  . Sulfa Antibiotics Itching and Hives  . Amoxicillin Hives  . Ampicillin Hives  . Keflex [Cephalexin] Itching  . Lisinopril Other (See Comments)  . Losartan Other (See Comments)  . Sulfamethoxazole-Trimethoprim Other (See Comments)  . Cephalosporins Hives    Current Outpatient Prescriptions   Medication Sig Dispense Refill  . aspirin EC 81 MG tablet Take 81 mg by mouth 2 (two) times daily.     Marland Kitchen BIOTIN PO Take by mouth.    . Cholecalciferol (VITAMIN D3) 1000 UNITS CAPS Take 2,000 Units by mouth daily.     . Coenzyme Q10 (CO Q-10) 200 MG CAPS Take 200 mg by mouth daily.     Marland Kitchen glucose blood (FREESTYLE LITE) test strip Use 2 (two) times daily. Free style lite test strips    . isosorbide mononitrate (IMDUR) 60 MG 24 hr tablet Take 1 tablet (60 mg total) by mouth daily. 30 tablet 0  . levothyroxine (SYNTHROID, LEVOTHROID) 100 MCG tablet Take 100 mcg by mouth every morning.    . lidocaine-prilocaine (EMLA) cream Apply 1 application topically as needed. Apply to port 1-2 hours prior to chemotherapy. Cover with plastic wrap. 30 g 2  . Lutein 40 MG CAPS Take 40 mg by mouth daily.     . Magnesium Oxide 250 MG TABS Take 1 tablet (250 mg total) by mouth daily.  0  . metFORMIN (GLUCOPHAGE) 1000 MG tablet Take 2,000 mg by mouth daily.    . metoprolol tartrate (LOPRESSOR) 25 MG tablet Take 12.5 mg by mouth 2 (two) times daily.     . Misc Natural Products (BEE PROPOLIS PO) Take 1 tablet by mouth 2 (two) times daily.     . Multiple Vitamins-Minerals (MULTI FOR HIM 50+ PO) Take 1 tablet by mouth daily.    . ondansetron (ZOFRAN) 8 MG tablet Take 1 tablet (8 mg total) by mouth every 8 (eight) hours as needed for nausea or vomiting. 30 tablet 2  . prochlorperazine (COMPAZINE) 10 MG tablet Take 1 tablet (10 mg total) by mouth every 6 (six) hours as needed for nausea or vomiting. 30 tablet 2  . Red Yeast Rice 600 MG TABS Take 2 tablets by mouth at bedtime.    . sucralfate (CARAFATE) 1 g tablet Take 1 tablet (1 g total) by mouth 3 (three) times daily. Dissolve each tablet in 2-3 tbsp warm water, swish and swallow. 90 tablet 2  . Wound Dressings (MEDIHONEY WOUND/BURN DRESSING) PSTE   0   No current facility-administered medications for this visit.    Facility-Administered Medications Ordered in Other  Visits  Medication Dose Route Frequency Provider Last Rate Last Dose  . 0.9 %  sodium chloride infusion   Intravenous Continuous Lloyd Huger, MD        OBJECTIVE: Vitals:   04/17/16 0957  BP: 130/68  Pulse: 78  Resp: 18  Temp: (!) 96.2 F (35.7 C)     Body mass index is 20.54 kg/m.    ECOG FS:1 - Symptomatic but completely ambulatory  General: Well-developed, well-nourished, no acute distress. Eyes: Pink conjunctiva, anicteric sclera. HEENT: Normocephalic, moist mucous membranes, clear oropharnyx. Lungs: Clear to auscultation bilaterally. Heart: Regular rate and rhythm. No rubs, murmurs, or gallops. Abdomen: Soft, nontender, nondistended. No organomegaly noted, normoactive bowel sounds. Musculoskeletal: No edema, cyanosis, or clubbing. Neuro: Alert, answering all questions appropriately. Cranial nerves grossly intact. Skin: No  rashes or petechiae noted. Psych: Normal affect. Lymphatics: No cervical, calvicular, axillary or inguinal LAD.  LAB RESULTS:  Lab Results  Component Value Date   NA 134 (L) 04/17/2016   K 3.8 04/17/2016   CL 97 (L) 04/17/2016   CO2 26 04/17/2016   GLUCOSE 148 (H) 04/17/2016   BUN 12 04/17/2016   CREATININE 0.54 (L) 04/17/2016   CALCIUM 9.2 04/17/2016   PROT 6.6 04/17/2016   ALBUMIN 3.8 04/17/2016   AST 40 04/17/2016   ALT 22 04/17/2016   ALKPHOS 80 04/17/2016   BILITOT 0.3 04/17/2016   GFRNONAA >60 04/17/2016   GFRAA >60 04/17/2016    Lab Results  Component Value Date   WBC 6.6 04/17/2016   NEUTROABS 5.7 04/17/2016   HGB 9.9 (L) 04/17/2016   HCT 28.5 (L) 04/17/2016   MCV 91.9 04/17/2016   PLT 313 04/17/2016   Lab Results  Component Value Date   IRON 31 (L) 12/22/2015   TIBC 400 12/22/2015   IRONPCTSAT 8 (L) 12/22/2015    Lab Results  Component Value Date   FERRITIN 32 12/22/2015   Lab Results  Component Value Date   CA199 23,612 (H) 04/10/2016   Lab Results  Component Value Date   CEA 404.1 (H) 04/10/2016      STUDIES: Nm Pet Image Restag (ps) Skull Base To Thigh  Result Date: 03/23/2016 CLINICAL DATA:  Subsequent treatment strategy for esophageal carcinoma with liver metastasis. Rising tumor markers. EXAM: NUCLEAR MEDICINE PET SKULL BASE TO THIGH TECHNIQUE: 12.8 mCi F-18 FDG was injected intravenously. Full-ring PET imaging was performed from the skull base to thigh after the radiotracer. CT data was obtained and used for attenuation correction and anatomic localization. FASTING BLOOD GLUCOSE:  Value: 84 mg/dl COMPARISON:  02/02/2016 FINDINGS: NECK No hypermetabolic lymph nodes in the neck. CHEST FDG uptake persists in the lower thoracic esophagus, with SUV measuring 5.5 compared to 5.2 previously. 1.4 cm right paratracheal lymph node is stable in size, and has SUV max of 3.9 on today's study compared to 3.1 previously. No new hypermetabolic lymphadenopathy identified. Bilateral upper lobe prominent sub-cm pulmonary nodules show no significant change in size or number compared to previous study. Index nodule in peripheral right upper lobe measures 6 mm on image 87/3 and has SUV max of 1.3. These findings show no significant change compared to previous study. Mild emphysema again noted. No evidence of pleural effusion. Aortic and coronary artery atherosclerosis. ABDOMEN/PELVIS Hypermetabolic lesion in the anterior right hepatic lobe has SUV max of 6.0 compared with 4.1 previously. Hypermetabolic lesion in the inferior right lobe has current SUV max of 6.2 compared to 3.0 previously. Several hypermetabolic right upper quadrant omental nodules are seen, largest measuring 9 mm on image 160/3, compared to 7 mm previously. This has current SUV max of 3.7 compared to 2.0 previously. Ill-defined soft tissue mass involving the celiac axis measures 5.8 x 4.2 cm, without significant change in size since previous study. This mass has SUV max of 7.3 compared to 4.3 previously. 18 mm portacaval lymph node showing peripheral  calcification has SUV max of 8.8 on today's study, compared with 3.8 previously. 10 mm left inguinal lymph node has increased in size compared to 5 mm previously. This lymph node has SUV max of 6.7 on today's study, compared to 3.7 previously. Aortic atherosclerosis. Colonic diverticulosis again seen, without evidence of diverticulitis. SKELETON No focal hypermetabolic activity to suggest skeletal metastasis. IMPRESSION: Mild progression of hypermetabolic lymphadenopathy in right paratracheal region, celiac axis,  portacaval space, and left inguinal region. Mild progression of small hypermetabolic liver metastases and omental soft tissue nodules in right upper quadrant. No significant change in tiny bilateral pulmonary metastases. Incidental findings including aortic and coronary atherosclerosis and colonic diverticulosis. Electronically Signed   By: Earle Gell M.D.   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  PLAN:    1. 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 Patient's tumor markers are increasing. He received cycle 9 of FOLFOX plus Herceptin + Neulasta on 03/27/2016.  Patient will receive Cyramza on days 1 and 15 and Taxol on days 1, 8, and 15. Patient will have day 22 off.  Proceed with cycle 1, day 8 which will be Taxol only today. Return to clinic in 1 week for consideration of cycle 1, day 15. 2. Iron deficiency anemia: Consider IV iron in the future. 3. Recent MI: Treatment per cardiology. MUGA scan on March 20, 2016 reported an EF of 52.8%, unchanged. 4. Weight loss: Improving, monitor. 5. Dysphagia: XRT has been completed.  Patient denies any issues swallowing. 6. Leukocytosis secondary to Neulasta.  No evidence of infection. 7. Weakness and fatigue: Improving. 8. Ankle ulcer:  Nonhealing, continue treatment per wound care.  Dressing intact.  Patient expressed understanding and was in agreement with this plan. He also understands that He can call clinic at any time with any questions, concerns, or complaints.    Lloyd Huger, MD   04/19/2016 2:12 PM

## 2016-04-17 ENCOUNTER — Inpatient Hospital Stay: Payer: Medicare Other | Attending: Hematology and Oncology | Admitting: Oncology

## 2016-04-17 ENCOUNTER — Inpatient Hospital Stay: Payer: Medicare Other

## 2016-04-17 VITALS — BP 110/66 | HR 71

## 2016-04-17 VITALS — BP 130/68 | HR 78 | Temp 96.2°F | Resp 18 | Wt 131.2 lb

## 2016-04-17 DIAGNOSIS — Z8673 Personal history of transient ischemic attack (TIA), and cerebral infarction without residual deficits: Secondary | ICD-10-CM | POA: Diagnosis not present

## 2016-04-17 DIAGNOSIS — I77811 Abdominal aortic ectasia: Secondary | ICD-10-CM | POA: Diagnosis not present

## 2016-04-17 DIAGNOSIS — R531 Weakness: Secondary | ICD-10-CM | POA: Diagnosis not present

## 2016-04-17 DIAGNOSIS — E039 Hypothyroidism, unspecified: Secondary | ICD-10-CM | POA: Diagnosis not present

## 2016-04-17 DIAGNOSIS — L97309 Non-pressure chronic ulcer of unspecified ankle with unspecified severity: Secondary | ICD-10-CM | POA: Diagnosis not present

## 2016-04-17 DIAGNOSIS — R5383 Other fatigue: Secondary | ICD-10-CM | POA: Insufficient documentation

## 2016-04-17 DIAGNOSIS — D72829 Elevated white blood cell count, unspecified: Secondary | ICD-10-CM | POA: Diagnosis not present

## 2016-04-17 DIAGNOSIS — M5134 Other intervertebral disc degeneration, thoracic region: Secondary | ICD-10-CM | POA: Insufficient documentation

## 2016-04-17 DIAGNOSIS — R1310 Dysphagia, unspecified: Secondary | ICD-10-CM | POA: Insufficient documentation

## 2016-04-17 DIAGNOSIS — Z801 Family history of malignant neoplasm of trachea, bronchus and lung: Secondary | ICD-10-CM | POA: Insufficient documentation

## 2016-04-17 DIAGNOSIS — D649 Anemia, unspecified: Secondary | ICD-10-CM | POA: Insufficient documentation

## 2016-04-17 DIAGNOSIS — I340 Nonrheumatic mitral (valve) insufficiency: Secondary | ICD-10-CM | POA: Diagnosis not present

## 2016-04-17 DIAGNOSIS — M129 Arthropathy, unspecified: Secondary | ICD-10-CM | POA: Insufficient documentation

## 2016-04-17 DIAGNOSIS — C787 Secondary malignant neoplasm of liver and intrahepatic bile duct: Secondary | ICD-10-CM | POA: Diagnosis not present

## 2016-04-17 DIAGNOSIS — I517 Cardiomegaly: Secondary | ICD-10-CM | POA: Insufficient documentation

## 2016-04-17 DIAGNOSIS — Z7984 Long term (current) use of oral hypoglycemic drugs: Secondary | ICD-10-CM

## 2016-04-17 DIAGNOSIS — D509 Iron deficiency anemia, unspecified: Secondary | ICD-10-CM | POA: Diagnosis not present

## 2016-04-17 DIAGNOSIS — G629 Polyneuropathy, unspecified: Secondary | ICD-10-CM | POA: Insufficient documentation

## 2016-04-17 DIAGNOSIS — G4730 Sleep apnea, unspecified: Secondary | ICD-10-CM | POA: Diagnosis not present

## 2016-04-17 DIAGNOSIS — R634 Abnormal weight loss: Secondary | ICD-10-CM | POA: Insufficient documentation

## 2016-04-17 DIAGNOSIS — I10 Essential (primary) hypertension: Secondary | ICD-10-CM

## 2016-04-17 DIAGNOSIS — Z5111 Encounter for antineoplastic chemotherapy: Secondary | ICD-10-CM | POA: Insufficient documentation

## 2016-04-17 DIAGNOSIS — E119 Type 2 diabetes mellitus without complications: Secondary | ICD-10-CM | POA: Diagnosis not present

## 2016-04-17 DIAGNOSIS — I252 Old myocardial infarction: Secondary | ICD-10-CM | POA: Diagnosis not present

## 2016-04-17 DIAGNOSIS — C155 Malignant neoplasm of lower third of esophagus: Secondary | ICD-10-CM | POA: Diagnosis not present

## 2016-04-17 DIAGNOSIS — Z87891 Personal history of nicotine dependence: Secondary | ICD-10-CM | POA: Diagnosis not present

## 2016-04-17 DIAGNOSIS — Z7982 Long term (current) use of aspirin: Secondary | ICD-10-CM | POA: Insufficient documentation

## 2016-04-17 DIAGNOSIS — Z79899 Other long term (current) drug therapy: Secondary | ICD-10-CM | POA: Insufficient documentation

## 2016-04-17 LAB — COMPREHENSIVE METABOLIC PANEL
ALT: 22 U/L (ref 17–63)
AST: 40 U/L (ref 15–41)
Albumin: 3.8 g/dL (ref 3.5–5.0)
Alkaline Phosphatase: 80 U/L (ref 38–126)
Anion gap: 11 (ref 5–15)
BUN: 12 mg/dL (ref 6–20)
CO2: 26 mmol/L (ref 22–32)
Calcium: 9.2 mg/dL (ref 8.9–10.3)
Chloride: 97 mmol/L — ABNORMAL LOW (ref 101–111)
Creatinine, Ser: 0.54 mg/dL — ABNORMAL LOW (ref 0.61–1.24)
GFR calc Af Amer: >60 mL/min (ref >60)
GFR calc non Af Amer: >60 mL/min (ref >60)
Glucose, Bld: 148 mg/dL — ABNORMAL HIGH (ref 65–99)
Potassium: 3.8 mmol/L (ref 3.5–5.1)
Sodium: 134 mmol/L — ABNORMAL LOW (ref 135–145)
Total Bilirubin: 0.3 mg/dL (ref 0.3–1.2)
Total Protein: 6.6 g/dL (ref 6.5–8.1)

## 2016-04-17 LAB — MAGNESIUM: Magnesium: 2 mg/dL (ref 1.7–2.4)

## 2016-04-17 LAB — CBC WITH DIFFERENTIAL/PLATELET
Basophils Absolute: 0 10*3/uL (ref 0–0.1)
Basophils Relative: 0 %
Eosinophils Absolute: 0.1 10*3/uL (ref 0–0.7)
Eosinophils Relative: 1 %
HCT: 28.5 % — ABNORMAL LOW (ref 40.0–52.0)
Hemoglobin: 9.9 g/dL — ABNORMAL LOW (ref 13.0–18.0)
Lymphocytes Relative: 4 %
Lymphs Abs: 0.3 10*3/uL — ABNORMAL LOW (ref 1.0–3.6)
MCH: 31.8 pg (ref 26.0–34.0)
MCHC: 34.5 g/dL (ref 32.0–36.0)
MCV: 91.9 fL (ref 80.0–100.0)
Monocytes Absolute: 0.5 10*3/uL (ref 0.2–1.0)
Monocytes Relative: 7 %
Neutro Abs: 5.7 10*3/uL (ref 1.4–6.5)
Neutrophils Relative %: 88 %
Platelets: 313 10*3/uL (ref 150–440)
RBC: 3.1 MIL/uL — ABNORMAL LOW (ref 4.40–5.90)
RDW: 19.2 % — ABNORMAL HIGH (ref 11.5–14.5)
WBC: 6.6 10*3/uL (ref 3.8–10.6)

## 2016-04-17 MED ORDER — FAMOTIDINE IN NACL 20-0.9 MG/50ML-% IV SOLN
20.0000 mg | Freq: Once | INTRAVENOUS | Status: AC
  Administered 2016-04-17: 20 mg via INTRAVENOUS
  Filled 2016-04-17: qty 50

## 2016-04-17 MED ORDER — DEXAMETHASONE SODIUM PHOSPHATE 10 MG/ML IJ SOLN
10.0000 mg | Freq: Once | INTRAMUSCULAR | Status: AC
  Administered 2016-04-17: 10 mg via INTRAVENOUS
  Filled 2016-04-17: qty 1

## 2016-04-17 MED ORDER — SODIUM CHLORIDE 0.9 % IV SOLN
80.0000 mg/m2 | Freq: Once | INTRAVENOUS | Status: AC
  Administered 2016-04-17: 132 mg via INTRAVENOUS
  Filled 2016-04-17: qty 22

## 2016-04-17 MED ORDER — SODIUM CHLORIDE 0.9 % IV SOLN
Freq: Once | INTRAVENOUS | Status: AC
  Administered 2016-04-17: 11:00:00 via INTRAVENOUS
  Filled 2016-04-17: qty 1000

## 2016-04-17 MED ORDER — SODIUM CHLORIDE 0.9% FLUSH
10.0000 mL | INTRAVENOUS | Status: DC | PRN
  Administered 2016-04-17: 10 mL
  Filled 2016-04-17: qty 10

## 2016-04-17 MED ORDER — HEPARIN SOD (PORK) LOCK FLUSH 100 UNIT/ML IV SOLN
500.0000 [IU] | Freq: Once | INTRAVENOUS | Status: AC | PRN
  Administered 2016-04-17: 500 [IU]
  Filled 2016-04-17: qty 5

## 2016-04-17 MED ORDER — SODIUM CHLORIDE 0.9 % IV SOLN: 10.0000 mg | Freq: Once | INTRAVENOUS | Status: DC

## 2016-04-17 MED ORDER — DIPHENHYDRAMINE HCL 50 MG/ML IJ SOLN
25.0000 mg | Freq: Once | INTRAMUSCULAR | Status: AC
  Administered 2016-04-17: 25 mg via INTRAVENOUS
  Filled 2016-04-17: qty 1

## 2016-04-17 NOTE — Progress Notes (Signed)
Patient states he is getting a little blood out of the left side of his nose when he blows it.  Also is beginning to have numbness and tingling in his hands. Sometimes his hands feel stiff.

## 2016-04-21 ENCOUNTER — Encounter: Payer: Medicare Other | Admitting: Surgery

## 2016-04-21 DIAGNOSIS — L89512 Pressure ulcer of right ankle, stage 2: Secondary | ICD-10-CM | POA: Diagnosis not present

## 2016-04-21 DIAGNOSIS — L89510 Pressure ulcer of right ankle, unstageable: Secondary | ICD-10-CM | POA: Diagnosis not present

## 2016-04-21 DIAGNOSIS — E441 Mild protein-calorie malnutrition: Secondary | ICD-10-CM | POA: Diagnosis not present

## 2016-04-21 DIAGNOSIS — C155 Malignant neoplasm of lower third of esophagus: Secondary | ICD-10-CM | POA: Diagnosis not present

## 2016-04-21 DIAGNOSIS — E11621 Type 2 diabetes mellitus with foot ulcer: Secondary | ICD-10-CM | POA: Diagnosis not present

## 2016-04-21 DIAGNOSIS — I10 Essential (primary) hypertension: Secondary | ICD-10-CM | POA: Diagnosis not present

## 2016-04-21 DIAGNOSIS — D649 Anemia, unspecified: Secondary | ICD-10-CM | POA: Diagnosis not present

## 2016-04-22 NOTE — Progress Notes (Signed)
[name], [name] ([id number]) Visit Report for 04/21/2016 Arrival Information Details Patient Name: [name], [name] Date of Service: 04/21/2016 12:45 PM Medical Record Number: [id number] Patient Account Number: [id number] Date of Birth/Sex: May 20, 1940 (76 y.o. Male) Treating RN: Baruch Gouty, RN, BSN, Velva Harman Primary Care Duyen Beckom: LADA, [facility] Other Clinician: Referring Jamariyah Johannsen: LADA, [place] Treating Seymour Pavlak/Extender: Frann Rider in Treatment: 6 Visit Information History Since Last Visit All ordered tests and consults were completed: No Patient Arrived: Ambulatory Added or deleted any medications: No Arrival Time: 12:51 Any new allergies or adverse reactions: No Accompanied By: wife Had a fall or experienced change in No Transfer Assistance: None activities of daily living that may affect Patient Identification Verified: Yes risk of falls: Secondary Verification Process Yes Signs or symptoms of abuse/neglect since last No Completed: visito Patient Requires Transmission- No Hospitalized since last visit: No Based Precautions: Has Dressing in Place as Prescribed: Yes Patient Has Alerts: Yes Pain Present Now: No Patient Alerts: Patient on Blood Thinner DM II 81mg  aspirin twice daily Electronic Signature(s) Signed: 04/21/2016 4:16:44 PM By: Regan Lemming BSN, RN Entered By: Regan Lemming on 04/21/2016 12:51:57 [name] ([id number]) -------------------------------------------------------------------------------- Encounter Discharge Information Details Patient Name: [name] Date of Service: 04/21/2016 12:45 PM Medical Record Number: [id number] Patient Account Number: [id number] Date of Birth/Sex: October 19, 1940 (76 y.o. Male) Treating RN: Baruch Gouty, RN, BSN, Velva Harman Primary Care Yanett Conkright: LADA, Rip Harbour Other Clinician: Referring Saw Mendenhall: LADA, [place] Treating Allsion Nogales/Extender: Frann Rider in Treatment: 6 Encounter Discharge Information Items Discharge Pain Level:  0 Discharge Condition: Stable Ambulatory Status: Ambulatory Discharge Destination: Home Transportation: Private Auto Accompanied By: wife Schedule Follow-up Appointment: No Medication Reconciliation completed and provided to Patient/Care No Ambre Kobayashi: Provided on Clinical Summary of Care: 04/21/2016 Form Type Recipient Paper Patient FW Electronic Signature(s) Signed: 04/21/2016 1:24:21 PM By: Ruthine Dose Entered By: Ruthine Dose on 04/21/2016 13:24:21 [name] ([id number]) -------------------------------------------------------------------------------- Lower Extremity Assessment Details Patient Name: [name] Date of Service: 04/21/2016 12:45 PM Medical Record Number: [id number] Patient Account Number: [id number] Date of Birth/Sex: [birth year] (76 y.o. Male) Treating RN: Baruch Gouty, RN, BSN, Velva Harman Primary Care Treyvonne Tata: LADA, Rip Harbour Other Clinician: Referring Haiven Nardone: LADA, [place] Treating Shan Padgett/Extender: Frann Rider in Treatment: 6 Vascular Assessment Claudication: Claudication Assessment [Right:None] Pulses: Dorsalis Pedis Palpable: [Right:Yes] Posterior Tibial Extremity colors, hair growth, and conditions: Extremity Color: [Right:Normal] Hair Growth on Extremity: [Right:Yes] Temperature of Extremity: [Right:Warm] Capillary Refill: [Right:< 3 seconds] Toe Nail Assessment Left: Right: Thick: Yes Discolored: Yes Electronic Signature(s) Signed: 04/21/2016 4:16:44 PM By: Regan Lemming BSN, RN Entered By: Regan Lemming on 04/21/2016 12:53:22 [name] ([id number]) -------------------------------------------------------------------------------- Multi Wound Chart Details Patient Name: [name] Date of Service: 04/21/2016 12:45 PM Medical Record Number: [id number] Patient Account Number: [id number] Date of Birth/Sex: 08/10/1940 (76 y.o. Male) Treating RN: Baruch Gouty, RN, BSN, Velva Harman Primary Care Randi College: LADA, Rip Harbour Other Clinician: Referring  Loranda Mastel: LADA, [place] Treating Terita Hejl/Extender: Frann Rider in Treatment: 6 Vital Signs Height(in): 67 Pulse(bpm): 70 Weight(lbs): 130.45 Blood Pressure 132/55 (mmHg): Body Mass Index(BMI): 20 Temperature(F): 97.8 Respiratory Rate 16 (breaths/min): Photos: [1:No Photos] [N/A:N/A] Wound Location: [1:Right Malleolus - Lateral] [N/A:N/A] Wounding Event: [1:Gradually Appeared] [N/A:N/A] Primary Etiology: [1:Pressure Ulcer] [N/A:N/A] Secondary Etiology: [1:Diabetic Wound/Ulcer of the Lower Extremity] [N/A:N/A] Comorbid History: [1:Cataracts, Chronic sinus problems/congestion, Anemia, Sleep Apnea, Hypertension, Myocardial Infarction, Type II Diabetes, Received Chemotherapy, Received Radiation] [N/A:N/A] Date Acquired: [1:01/09/2016] [N/A:N/A] Weeks of Treatment: [1:6] [N/A:N/A] Wound Status: [1:Open] [N/A:N/A] Measurements L x W x D 0.9x0.9x0.1 [N/A:N/A] (cm) Area (cm) : [  1:0.636] [N/A:N/A] Volume (cm) : [1:0.064] [N/A:N/A] % Reduction in Area: [1:-169.50%] [N/A:N/A] % Reduction in Volume: -166.70% [N/A:N/A] Classification: [1:Unstageable/Unclassified] [N/A:N/A] HBO Classification: [1:Grade 1] [N/A:N/A] Exudate Amount: [1:Medium] [N/A:N/A] Exudate Type: [1:Serous] [N/A:N/A] Exudate Color: [1:amber] [N/A:N/A] Wound Margin: [1:Flat and Intact] [N/A:N/A] Granulation Amount: [1:None Present (0%)] [N/A:N/A] Necrotic Amount: Large (67-100%) N/A N/A Exposed Structures: Fat Layer (Subcutaneous N/A N/A Tissue) Exposed: Yes Fascia: No Tendon: No Muscle: No Joint: No Bone: No Epithelialization: None N/A N/A Debridement: Debridement ([id number]- N/A N/A 11047) Pre-procedure 13:13 N/A N/A Verification/Time Out Taken: Pain Control: Lidocaine 4% Topical N/A N/A Solution Tissue Debrided: Fibrin/Slough, N/A N/A Subcutaneous Level: Skin/Subcutaneous N/A N/A Tissue Debridement Area (sq 0.81 N/A N/A cm): Instrument: Curette N/A N/A Bleeding: Minimum N/A N/A Hemostasis  Achieved: Pressure N/A N/A Procedural Pain: 0 N/A N/A Post Procedural Pain: 0 N/A N/A Debridement Treatment Procedure was tolerated N/A N/A Response: well Post Debridement 0.9x0.9x0.9 N/A N/A Measurements L x W x D (cm) Post Debridement 0.573 N/A N/A Volume: (cm) Post Debridement Unstageable/Unclassified N/A N/A Stage: Periwound Skin Texture: Induration: Yes N/A N/A Periwound Skin Maceration: Yes N/A N/A Moisture: Dry/Scaly: No Periwound Skin Color: Erythema: Yes N/A N/A Erythema Location: Circumferential N/A N/A Erythema Change: Decreased N/A N/A Temperature: No Abnormality N/A N/A Tenderness on Yes N/A N/A Palpation: Wound Preparation: Ulcer Cleansing: N/A N/A Rinsed/Irrigated with Saline Topical Anesthetic JVEON, POUND ([id number]) Applied: Other: lidociane 4% Procedures Performed: Debridement N/A N/A Treatment Notes Wound #1 (Right, Lateral Malleolus) 1. Cleansed with: Clean wound with Normal Saline 4. Dressing Applied: Iodosorb Ointment 5. Secondary Dressing Applied Bordered Foam Dressing Dry Gauze Electronic Signature(s) Signed: 04/21/2016 1:24:35 PM By: Christin Fudge MD, FACS Entered By: Christin Fudge on 04/21/2016 13:24:35 [name] ([id number]) -------------------------------------------------------------------------------- [place] Details Patient Name: [name], [name] Date of Service: 04/21/2016 12:45 PM Medical Record Number: [id number] Patient Account Number: [id number] Date of Birth/Sex: 1940/09/17 (76 y.o. Male) Treating RN: Baruch Gouty, RN, BSN, Velva Harman Primary Care Renea Schoonmaker: LADA, Rip Harbour Other Clinician: Referring Alainah Phang: LADA, [place] Treating Robynn Marcel/Extender: Frann Rider in Treatment: 6 Active Inactive ` Orientation to the Wound Care Program Nursing Diagnoses: Knowledge deficit related to the wound healing center program Goals: Patient/caregiver will verbalize understanding of the [facility]  Program Date Initiated: 03/10/2016 Target Resolution Date: 03/23/2016 Goal Status: Active Interventions: Provide education on orientation to the wound center Notes: ` Soft Tissue Infection Nursing Diagnoses: Impaired tissue integrity Potential for infection: soft tissue Goals: Patient will remain free of wound infection Date Initiated: 03/10/2016 Target Resolution Date: 03/17/2016 Goal Status: Active Interventions: Assess signs and symptoms of infection every visit Notes: ` Wound/Skin Impairment Nursing Diagnoses: Impaired tissue integrity [name], [name] ([id number]) Goals: Ulcer/skin breakdown will heal within 14 weeks Date Initiated: 03/10/2016 Target Resolution Date: 03/24/2016 Goal Status: Active Interventions: Assess patient/caregiver ability to obtain necessary supplies Notes: Electronic Signature(s) Signed: 04/21/2016 4:16:44 PM By: Regan Lemming BSN, RN Entered By: Regan Lemming on 04/21/2016 32:44:01 [name] ([id number]) -------------------------------------------------------------------------------- Pain Assessment Details Patient Name: [name] Date of Service: 04/21/2016 12:45 PM Medical Record Number: [id number] Patient Account Number: [id number] Date of Birth/Sex: 1940-04-04 (76 y.o. Male) Treating RN: Baruch Gouty, RN, BSN, Velva Harman Primary Care Meko Masterson: LADA, Rip Harbour Other Clinician: Referring Kayman Snuffer: LADA, [place] Treating Emmogene Simson/Extender: Frann Rider in Treatment: 6 Active Problems Location of Pain Severity and Description of Pain Patient Has Paino No Site Locations With Dressing Change: No Pain Management and Medication Current Pain Management: Electronic Signature(s) Signed: 04/21/2016 4:16:44 PM By: Regan Lemming  BSN, RN Entered By: Regan Lemming on 04/21/2016 12:52:04 [name] ([id number]) -------------------------------------------------------------------------------- Patient/Caregiver Education Details Patient Name: [name] Date of Service: 04/21/2016 12:45 PM Medical Record Number: [id number] Patient Account Number: [id number] Date of Birth/Gender: 07-11-1940 (76 y.o. Male) Treating RN: Baruch Gouty, RN, BSN, Velva Harman Primary Care Physician: LADA, Rip Harbour Other Clinician: Referring Physician: LADA, [place] Treating Physician/Extender: Frann Rider in Treatment: 6 Education Assessment Education Provided To: Patient Education Topics Provided Welcome To [facility]: Methods: Explain/Verbal Responses: State content correctly Wound Debridement: Methods: Explain/Verbal Responses: State content correctly Wound/Skin Impairment: Methods: Explain/Verbal Responses: State content correctly Electronic Signature(s) Signed: 04/21/2016 4:16:44 PM By: Regan Lemming BSN, RN Entered By: Regan Lemming on 04/21/2016 13:17:22 [name] ([id number]) -------------------------------------------------------------------------------- Wound Assessment Details Patient Name: [name] Date of Service: 04/21/2016 12:45 PM Medical Record Number: [id number] Patient Account Number: [id number] Date of Birth/Sex: 1940/11/15 (76 y.o. Male) Treating RN: Afful, RN, BSN, [place] Primary Care Craigory Toste: LADA, Rip Harbour Other Clinician: Referring Ceniya Fowers: LADA, [place] Treating Emori Mumme/Extender: Frann Rider in Treatment: 6 Wound Status Wound Number: 1 Primary Pressure Ulcer Etiology: Wound Location: Right Malleolus - Lateral Secondary Diabetic Wound/Ulcer of the Lower Wounding Event: Gradually Appeared Etiology: Extremity Date Acquired: 01/09/2016 Wound Open Weeks Of Treatment: 6 Status: Clustered Wound: No Comorbid Cataracts, Chronic sinus History: problems/congestion, Anemia, Sleep Apnea, Hypertension, Myocardial Infarction, Type II Diabetes, Received Chemotherapy, Received Radiation Photos Photo Uploaded By: Regan Lemming on 04/21/2016 14:05:20 Wound Measurements Length: (cm) 0.9 Width: (cm) 0.9 Depth:  (cm) 0.1 Area: (cm) 0.636 Volume: (cm) 0.064 % Reduction in Area: -169.5% % Reduction in Volume: -166.7% Epithelialization: None Tunneling: No Undermining: No Wound Description Classification: Unstageable/Unclassified Foul Od Diabetic Severity (Wagner): Grade 1 Slough/ Wound Margin: Flat and Intact Exudate Amount: Medium Exudate Type: Serous Exudate Color: amber [name], [name] ([id number]) or After Cleansing: No Fibrino Yes Wound Bed Granulation Amount: None Present (0%) Exposed Structure Necrotic Amount: Large (67-100%) Fascia Exposed: No Necrotic Quality: Adherent Slough Fat Layer (Subcutaneous Tissue) Exposed: Yes Tendon Exposed: No Muscle Exposed: No Joint Exposed: No Bone Exposed: No Periwound Skin Texture Texture Color No Abnormalities Noted: No No Abnormalities Noted: No Induration: Yes Erythema: Yes Erythema Location: Circumferential Moisture Erythema Change: Decreased No Abnormalities Noted: No Dry / Scaly: No Temperature / Pain Maceration: Yes Temperature: No Abnormality Tenderness on Palpation: Yes Wound Preparation Ulcer Cleansing: Rinsed/Irrigated with Saline Topical Anesthetic Applied: Other: lidociane 4%, Treatment Notes Wound #1 (Right, Lateral Malleolus) 1. Cleansed with: Clean wound with Normal Saline 4. Dressing Applied: Iodosorb Ointment 5. Secondary Dressing Applied Bordered Foam Dressing Dry Gauze Electronic Signature(s) Signed: 04/21/2016 4:16:44 PM By: Regan Lemming BSN, RN Entered By: Regan Lemming on 04/21/2016 12:56:40 [name] ([id number]) -------------------------------------------------------------------------------- [place] Details Patient Name: [name] Date of Service: 04/21/2016 12:45 PM Medical Record Number: [id number] Patient Account Number: [id number] Date of Birth/Sex: 1940-07-10 (76 y.o. Male) Treating RN: Afful, RN, BSN, [place] Primary Care Puneet Selden: LADA, Rip Harbour Other Clinician: Referring Kamarii Buren:  LADA, [place] Treating Cyndee Giammarco/Extender: Frann Rider in Treatment: 6 Vital Signs Time Taken: 12:52 Temperature (F): 97.8 Height (in): 67 Pulse (bpm): 70 Weight (lbs): 130.45 Respiratory Rate (breaths/min): 16 Body Mass Index (BMI): 20.4 Blood Pressure (mmHg): 132/55 Reference Range: 80 - 120 mg / dl Electronic Signature(s) Signed: 04/21/2016 4:16:44 PM By: Regan Lemming BSN, RN Entered By: Regan Lemming on 04/21/2016 12:52:46

## 2016-04-22 NOTE — Progress Notes (Signed)
ZACARY, BAUER ([id number]) Visit Report for 04/21/2016 Chief Complaint Document Details Patient Name: [name], [name]. Date of Service: 04/21/2016 12:45 PM Medical Record Number: [id number] Patient Account Number: [id number] Date of Birth/Sex: 09-04-1940 (76 y.o. Male) Treating RN: Baruch Gouty, RN, BSN, Velva Harman Primary Care Provider: LADA, Rip Harbour Other Clinician: Referring Provider: LADA, [place] Treating Provider/Extender: Frann Rider in Treatment: 6 Information Obtained from: Patient Chief Complaint Patient is at the clinic for treatment of an open pressure ulcer to the right lateral ankle which she's had for about 2 months Electronic Signature(s) Signed: 04/21/2016 1:24:54 PM By: Christin Fudge MD, FACS Entered By: Christin Fudge on 04/21/2016 13:24:54 [name] ([id number]) -------------------------------------------------------------------------------- Debridement Details Patient Name: [name] Date of Service: 04/21/2016 12:45 PM Medical Record Number: [id number] Patient Account Number: [id number] Date of Birth/Sex: [birth year] (76 y.o. Male) Treating RN: Afful, RN, BSN, [place] Primary Care Provider: LADA, Rip Harbour Other Clinician: Referring Provider: LADA, [place] Treating Provider/Extender: Frann Rider in Treatment: 6 Debridement Performed for Wound #1 Right,Lateral Malleolus Assessment: Performed By: Physician Christin Fudge, MD Debridement: Debridement Pre-procedure Yes - 13:13 Verification/Time Out Taken: Start Time: 13:13 Pain Control: Lidocaine 4% Topical Solution Level: Skin/Subcutaneous Tissue Total Area Debrided (L x 0.9 (cm) x 0.9 (cm) = 0.81 (cm) W): Tissue and other Non-Viable, Fibrin/Slough, Subcutaneous material debrided: Instrument: Curette Bleeding: Minimum Hemostasis Achieved: Pressure End Time: 13:16 Procedural Pain: 0 Post Procedural Pain: 0 Response to Treatment: Procedure was tolerated well Post Debridement Measurements of Total  Wound Length: (cm) 0.9 Stage: Unstageable/Unclassified Width: (cm) 0.9 Depth: (cm) 0.9 Volume: (cm) 0.573 Character of Wound/Ulcer Post Requires Further Debridement: Debridement Severity of Tissue Post Fat layer exposed Debridement: Post Procedure Diagnosis Same as Pre-procedure Electronic Signature(s) Signed: 04/21/2016 1:24:46 PM By: Christin Fudge MD, FACS Signed: 04/21/2016 4:16:44 PM By: Regan Lemming BSN, RN Entered By: Christin Fudge on 04/21/2016 13:24:46 RASTUS, BORTON ([id number], CLASON ([id number]) -------------------------------------------------------------------------------- HPI Details Patient Name: [name]. Date of Service: 04/21/2016 12:45 PM Medical Record Number: [id number] Patient Account Number: [id number] Date of Birth/Sex: [birth year] (76 y.o. Male) Treating RN: Baruch Gouty, RN, BSN, Velva Harman Primary Care Provider: LADA, Rip Harbour Other Clinician: Referring Provider: LADA, [place] Treating Provider/Extender: Frann Rider in Treatment: 6 History of Present Illness Location: Patient presents with an ulcer on the right lateral ankle. Quality: Patient reports experiencing a dull pain to affected area(s). Severity: Patient states wound are getting worse. Duration: Patient has had the wound for > 2 months prior to seeking treatment at the wound center Timing: Pain in wound is Intermittent (comes and goes Context: The wound appeared gradually over time Modifying Factors: Other treatment(s) tried include:local care as per the medical oncologist Associated Signs and Symptoms: Patient reports having:some pain and no discharge HPI Description: 76 year old patient has been referred by his medical oncologist Dr. Grayland Ormond, for a right lateral malleolus ulcer has been there for several weeks. 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 He is currently on chemotherapy with FOLFOX and Herceptin. past medical  history significant for diabetes mellitus, anemia, hypertension, sleep apnea, status post appendectomy, peripheral vascular catheterization( Port placement) in September 2017 by Dr. Delana Meyer. He is also receiving palliative radiation therapy and was seen by Dr. Donella Stade. 03/17/2016 -- - x-ray of the right ankle -- IMPRESSION: 1. Soft tissue wound noted over the lateral malleolus, no underlying bony abnormality. No acute bony abnormality. 2. Diffuse degenerative change. 3. Peripheral vascular disease. Electronic Signature(s) Signed: 04/21/2016  1:24:58 PM By: Christin Fudge MD, FACS Entered By: Christin Fudge on 04/21/2016 13:24:58 [name] ([id number]) -------------------------------------------------------------------------------- Physical Exam Details Patient Name: [name] Date of Service: 04/21/2016 12:45 PM Medical Record Number: [id number] Patient Account Number: [id number] Date of Birth/Sex: 1940-09-17 (76 y.o. Male) Treating RN: Baruch Gouty, RN, BSN, Velva Harman Primary Care Provider: LADA, Rip Harbour Other Clinician: Referring Provider: LADA, [place] Treating Provider/Extender: Frann Rider in Treatment: 6 Constitutional . Pulse regular. Respirations normal and unlabored. Afebrile. . Eyes Nonicteric. Reactive to light. Ears, Nose, Mouth, and Throat Lips, teeth, and gums WNL.Marland Kitchen Moist mucosa without lesions. Neck supple and nontender. No palpable supraclavicular or cervical adenopathy. Normal sized without goiter. Respiratory WNL. No retractions.. Breath sounds WNL, No rubs, rales, rhonchi, or wheeze.. Cardiovascular Heart rhythm and rate regular, no murmur or gallop.. Pedal Pulses WNL. No clubbing, cyanosis or edema. Chest Breasts symmetical and no nipple discharge.. Breast tissue WNL, no masses, lumps, or tenderness.. Lymphatic No adneopathy. No adenopathy. No adenopathy. Musculoskeletal Adexa without tenderness or enlargement.. Digits and nails w/o clubbing, cyanosis,  infection, petechiae, ischemia, or inflammatory conditions.. Integumentary (Hair, Skin) No suspicious lesions. No crepitus or fluctuance. No peri-wound warmth or erythema. No masses.Marland Kitchen Psychiatric Judgement and insight Intact.. No evidence of depression, anxiety, or agitation.. Notes subcutis debris persists and I have used #3 curet to sharply remove the sutures as much as possible and minimal bleeding controlled with pressure Electronic Signature(s) Signed: 04/21/2016 1:25:19 PM By: Christin Fudge MD, FACS Entered By: Christin Fudge on 04/21/2016 13:25:19 [name] ([id number]) -------------------------------------------------------------------------------- Physician Orders Details Patient Name: [name] Date of Service: 04/21/2016 12:45 PM Medical Record Number: [id number] Patient Account Number: [id number] Date of Birth/Sex: 1940-06-10 (76 y.o. Male) Treating RN: Baruch Gouty, RN, BSN, Velva Harman Primary Care Provider: LADA, Rip Harbour Other Clinician: Referring Provider: LADA, [place] Treating Provider/Extender: Frann Rider in Treatment: 6 Verbal / Phone Orders: No Diagnosis Coding Wound Cleansing Wound #1 Right,Lateral Malleolus o Clean wound with Normal Saline. o Cleanse wound with mild soap and water Anesthetic Wound #1 Right,Lateral Malleolus o Topical Lidocaine 4% cream applied to wound bed prior to debridement Primary Wound Dressing Wound #1 Right,Lateral Malleolus o Iodosorb Ointment Secondary Dressing Wound #1 Right,Lateral Malleolus o Dry Gauze o Boardered Foam Dressing Dressing Change Frequency Wound #1 Right,Lateral Malleolus o Change dressing every day. Follow-up Appointments Wound #1 Right,Lateral Malleolus o Return Appointment in 1 week. Off-Loading Wound #1 Right,Lateral Malleolus o Other: - Keep pressure off the malleolus Additional Orders / Instructions Wound #1 Right,Lateral Malleolus o Increase protein intake. o Activity  as tolerated LEXIE, MORINI ([id number]) Medications-please add to medication list. Wound #1 Right,Lateral Malleolus o Other: - Include these in your diet...VITAMIN A, C, ZINC, MVI Electronic Signature(s) Signed: 04/21/2016 3:56:49 PM By: Christin Fudge MD, FACS Signed: 04/21/2016 4:16:44 PM By: Regan Lemming BSN, RN Entered By: Regan Lemming on 04/21/2016 13:16:09 [name] ([id number]) -------------------------------------------------------------------------------- Problem List Details Patient Name: LYMAN, BALINGIT Date of Service: 04/21/2016 12:45 PM Medical Record Number: [id number] Patient Account Number: [id number] Date of Birth/Sex: 1940/12/16 (76 y.o. Male) Treating RN: Baruch Gouty, RN, BSN, Velva Harman Primary Care Provider: LADA, Rip Harbour Other Clinician: Referring Provider: LADA, [place] Treating Provider/Extender: Frann Rider in Treatment: 6 Active Problems Inactive Problems Resolved Problems Electronic Signature(s) Signed: 04/21/2016 1:24:31 PM By: Christin Fudge MD, FACS Entered By: Christin Fudge on 04/21/2016 13:24:31 [name] ([id number]) -------------------------------------------------------------------------------- Progress Note Details Patient Name: [name] Date of Service: 04/21/2016 12:45 PM Medical Record Number: [id number] Patient Account  Number: [id number] Date of Birth/Sex: 03-17-1940 (76 y.o. Male) Treating RN: Baruch Gouty, RN, BSN, Velva Harman Primary Care Provider: LADA, Rip Harbour Other Clinician: Referring Provider: LADA, MELINDA Treating Provider/Extender: Frann Rider in Treatment: 6 Subjective Chief Complaint Information obtained from Patient Patient is at the clinic for treatment of an open pressure ulcer to the right lateral ankle which she's had for about 2 months History of Present Illness (HPI) The following HPI elements were documented for the patient's wound: Location: Patient presents with an ulcer on the right lateral ankle. Quality:  Patient reports experiencing a dull pain to affected area(s). Severity: Patient states wound are getting worse. Duration: Patient has had the wound for > 2 months prior to seeking treatment at the wound center Timing: Pain in wound is Intermittent (comes and goes Context: The wound appeared gradually over time Modifying Factors: Other treatment(s) tried include:local care as per the medical oncologist Associated Signs and Symptoms: Patient reports having:some pain and no discharge 76 year old patient has been referred by his medical oncologist Dr. Grayland Ormond, for a right lateral malleolus ulcer has been there for several weeks. 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 He is currently on chemotherapy with FOLFOX and Herceptin. past medical history significant for diabetes mellitus, anemia, hypertension, sleep apnea, status post appendectomy, peripheral vascular catheterization( Port placement) in September 2017 by Dr. Delana Meyer. He is also receiving palliative radiation therapy and was seen by Dr. Donella Stade. 03/17/2016 -- - x-ray of the right ankle -- IMPRESSION: 1. Soft tissue wound noted over the lateral malleolus, no underlying bony abnormality. No acute bony abnormality. 2. Diffuse degenerative change. 3. Peripheral vascular disease. Objective Constitutional Pulse regular. Respirations normal and unlabored. Afebrile. Vitals Time Taken: 12:52 PM, Height: 67 in, Weight: 130.45 lbs, BMI: 20.4, Temperature: 97.8 F, Pulse: 70 Cassara, Riku G. ([id number]) bpm, Respiratory Rate: 16 breaths/min, Blood Pressure: 132/55 mmHg. Eyes Nonicteric. Reactive to light. Ears, Nose, Mouth, and Throat Lips, teeth, and gums WNL.Marland Kitchen Moist mucosa without lesions. Neck supple and nontender. No palpable supraclavicular or cervical adenopathy. Normal sized without goiter. Respiratory WNL. No retractions.. Breath sounds WNL, No rubs, rales, rhonchi, or  wheeze.. Cardiovascular Heart rhythm and rate regular, no murmur or gallop.. Pedal Pulses WNL. No clubbing, cyanosis or edema. Chest Breasts symmetical and no nipple discharge.. Breast tissue WNL, no masses, lumps, or tenderness.. Lymphatic No adneopathy. No adenopathy. No adenopathy. Musculoskeletal Adexa without tenderness or enlargement.. Digits and nails w/o clubbing, cyanosis, infection, petechiae, ischemia, or inflammatory conditions.Marland Kitchen Psychiatric Judgement and insight Intact.. No evidence of depression, anxiety, or agitation.. General Notes: subcutis debris persists and I have used #3 curet to sharply remove the sutures as much as possible and minimal bleeding controlled with pressure Integumentary (Hair, Skin) No suspicious lesions. No crepitus or fluctuance. No peri-wound warmth or erythema. No masses.. Wound #1 status is Open. Original cause of wound was Gradually Appeared. The wound is located on the Right,Lateral Malleolus. The wound measures 0.9cm length x 0.9cm width x 0.1cm depth; 0.636cm^2 area and 0.064cm^3 volume. There is Fat Layer (Subcutaneous Tissue) Exposed exposed. There is no tunneling or undermining noted. There is a medium amount of serous drainage noted. The wound margin is flat and intact. There is no granulation within the wound bed. There is a large (67-100%) amount of necrotic tissue within the wound bed including Adherent Slough. The periwound skin appearance exhibited: Induration, Maceration, Erythema. The periwound skin appearance did not exhibit: Dry/Scaly. The surrounding wound skin color  is noted with erythema which is circumferential. Periwound temperature was noted as No Abnormality. The periwound has tenderness on palpation. ANTAVION, BARTOSZEK ([id number]) Assessment Procedures Wound #1 Wound #1 is a Pressure Ulcer located on the Right,Lateral Malleolus . There was a Skin/Subcutaneous Tissue Debridement ([id number]) debridement with total area of  0.81 sq cm performed by Christin Fudge, MD. with the following instrument(s): Curette to remove Non-Viable tissue/material including Fibrin/Slough and Subcutaneous after achieving pain control using Lidocaine 4% Topical Solution. A time out was conducted at 13:13, prior to the start of the procedure. A Minimum amount of bleeding was controlled with Pressure. The procedure was tolerated well with a pain level of 0 throughout and a pain level of 0 following the procedure. Post Debridement Measurements: 0.9cm length x 0.9cm width x 0.9cm depth; 0.573cm^3 volume. Post debridement Stage noted as Unstageable/Unclassified. Character of Wound/Ulcer Post Debridement requires further debridement. Severity of Tissue Post Debridement is: Fat layer exposed. Post procedure Diagnosis Wound #1: Same as Pre-Procedure Plan Wound Cleansing: Wound #1 Right,Lateral Malleolus: Clean wound with Normal Saline. Cleanse wound with mild soap and water Anesthetic: Wound #1 Right,Lateral Malleolus: Topical Lidocaine 4% cream applied to wound bed prior to debridement Primary Wound Dressing: Wound #1 Right,Lateral Malleolus: Iodosorb Ointment Secondary Dressing: Wound #1 Right,Lateral Malleolus: Dry Gauze Boardered Foam Dressing Dressing Change Frequency: Wound #1 Right,Lateral Malleolus: Change dressing every day. Follow-up Appointments: [place], SOUTER ([id number]) Wound #1 Right,Lateral Malleolus: Return Appointment in 1 week. Off-Loading: Wound #1 Right,Lateral Malleolus: Other: - Keep pressure off the malleolus Additional Orders / Instructions: Wound #1 Right,Lateral Malleolus: Increase protein intake. Activity as tolerated Medications-please add to medication list.: Wound #1 Right,Lateral Malleolus: Other: - Include these in your diet...VITAMIN A, C, ZINC, MVI I have recommended: 1. Daily dressing with Iodosorb to be used. 2. Offloading has been discussed as much as possible in great detail 3.  Adequate protein, vitamin A, vitamin C and zinc. 4. regular visits to the wound center the patient's wife has had all questions answered and will be compliant Electronic Signature(s) Signed: 04/21/2016 1:26:16 PM By: Christin Fudge MD, FACS Entered By: Christin Fudge on 04/21/2016 13:26:16 [name] ([id number]) -------------------------------------------------------------------------------- [place] Details Patient Name: [name] Date of Service: 04/21/2016 Medical Record Number: [id number] Patient Account Number: [id number] Date of Birth/Sex: [birth year] (76 y.o. Male) Treating RN: Afful, RN, BSN, [place] Primary Care Provider: LADA, Rip Harbour Other Clinician: Referring Provider: LADA, [place] Treating Provider/Extender: Christin Fudge Service Line: Outpatient Weeks in Treatment: 6 Diagnosis Coding ICD-10 Codes Code Description E11.621 Type 2 diabetes mellitus with foot ulcer L89.512 Pressure ulcer of right ankle, stage 2 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 C15.5 Malignant neoplasm of lower third of esophagus Facility Procedures CPT4 Code: [id number] Description: 07622 - DEB SUBQ TISSUE 20 SQ CM/< ICD-10 Description Diagnosis E11.621 Type 2 diabetes mellitus with foot ulcer L89.512 Pressure ulcer of right ankle, stage 2 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

## 2016-04-23 NOTE — Progress Notes (Deleted)
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  INTERVAL HISTORY:  Patient returns to clinic today for further evaluation and consideration of cycle 1, day 8 of Cyramza and Taxol. Taxol only today. He tolerated his first treatment well without significant side effects. He currently feels well and is asymptomatic. He continues to have improved ability to swallow without dysphasia. He has a good appetite. He has an occasional nosebleed that usually resolves without intervention. She has a mild peripheral neuropathy. He has no other neurologic complaints. He denies any recent fevers or illnesses. He denies any chest pain or shortness of breath. He denies any nausea, vomiting, constipation, or diarrhea. He has no urinary complaints. Patient otherwise feels well and offers no further specific complaints.  REVIEW OF SYSTEMS:   Review of Systems  Constitutional: Negative for fever, malaise/fatigue and weight loss.  Respiratory: Negative.  Negative for shortness of breath.   Cardiovascular: Negative.  Negative for chest pain and leg swelling.  Gastrointestinal: Negative.  Negative for abdominal pain, blood in stool and melena.  Genitourinary: Negative.   Musculoskeletal: Negative.   Neurological: Positive for sensory change. Negative for weakness.  Psychiatric/Behavioral: Negative.  The patient is not nervous/anxious.     As per HPI. Otherwise, a complete review of systems is negative.  PAST MEDICAL HISTORY: Past Medical History:    Diagnosis Date  . Anemia   . Arthritis   . 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 Diabetes mellitus without complication ([place])   . Ectatic abdominal aorta ([place]) 08/12/2015  . Esophageal cancer ([place])   . GI bleed   . Hearing loss    left ear  . Hypertension   . Hypothyroidism   . Liver mass   . Mild left atrial enlargement 08/12/2015   Very mild; LA 4.2 cm; echo March 2017  . Mitral valvular regurgitation 08/12/2015  . Sleep apnea   . Stroke ([place])   . TIA (transient ischemic attack)   . 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 APPENDECTOMY    . EUS N/A 10/07/2015   Procedure: FULL UPPER ENDOSCOPIC ULTRASOUND (EUS) RADIAL;  Surgeon: Holly Bodily, MD;  Location: [facility] ENDOSCOPY;  Service: Endoscopy;  Laterality: N/A;  . EYE SURGERY    . FRACTURE SURGERY    . NOSE SURGERY    . PERIPHERAL VASCULAR CATHETERIZATION N/A 10/26/2015   Procedure: Glori Luis Cath Insertion;  Surgeon: Katha Cabal, MD;  Location: [facility];  Service: Cardiovascular;  Laterality: N/A;    FAMILY HISTORY Family History  Problem Relation Age of Onset  . Hypertension Mother   . Lung cancer Father   . Hypertension Brother        ADVANCED DIRECTIVES:    HEALTH MAINTENANCE: Social History  Substance Use Topics  . Smoking status: Former Smoker    Packs/day: 1.00    Types: Cigarettes    Quit date: 10/03/1983  . Smokeless tobacco: Never Used  . Alcohol use 0.0 oz/week  Comment: rarely     Colonoscopy:  PAP:  Bone density:  Lipid panel:  Allergies  Allergen Reactions  . Sulfa Antibiotics Itching and Hives  . Amoxicillin Hives  . Ampicillin Hives  . Keflex [Cephalexin] Itching  . Lisinopril Other (See Comments)  . Losartan Other (See Comments)  . Sulfamethoxazole-Trimethoprim Other (See Comments)  . Cephalosporins Hives    Current Outpatient Prescriptions   Medication Sig Dispense Refill  . aspirin EC 81 MG tablet Take 81 mg by mouth 2 (two) times daily.     Marland Kitchen BIOTIN PO Take by mouth.    . Cholecalciferol (VITAMIN D3) 1000 UNITS CAPS Take 2,000 Units by mouth daily.     . Coenzyme Q10 (CO Q-10) 200 MG CAPS Take 200 mg by mouth daily.     Marland Kitchen glucose blood (FREESTYLE LITE) test strip Use 2 (two) times daily. Free style lite test strips    . isosorbide mononitrate (IMDUR) 60 MG 24 hr tablet Take 1 tablet (60 mg total) by mouth daily. 30 tablet 0  . levothyroxine (SYNTHROID, LEVOTHROID) 100 MCG tablet Take 100 mcg by mouth every morning.    . lidocaine-prilocaine (EMLA) cream Apply 1 application topically as needed. Apply to port 1-2 hours prior to chemotherapy. Cover with plastic wrap. 30 g 2  . Lutein 40 MG CAPS Take 40 mg by mouth daily.     . Magnesium Oxide 250 MG TABS Take 1 tablet (250 mg total) by mouth daily.  0  . metFORMIN (GLUCOPHAGE) 1000 MG tablet Take 2,000 mg by mouth daily.    . metoprolol tartrate (LOPRESSOR) 25 MG tablet Take 12.5 mg by mouth 2 (two) times daily.     . Misc Natural Products (BEE PROPOLIS PO) Take 1 tablet by mouth 2 (two) times daily.     . Multiple Vitamins-Minerals (MULTI FOR HIM 50+ PO) Take 1 tablet by mouth daily.    . ondansetron (ZOFRAN) 8 MG tablet Take 1 tablet (8 mg total) by mouth every 8 (eight) hours as needed for nausea or vomiting. 30 tablet 2  . prochlorperazine (COMPAZINE) 10 MG tablet Take 1 tablet (10 mg total) by mouth every 6 (six) hours as needed for nausea or vomiting. 30 tablet 2  . Red Yeast Rice 600 MG TABS Take 2 tablets by mouth at bedtime.    . sucralfate (CARAFATE) 1 g tablet Take 1 tablet (1 g total) by mouth 3 (three) times daily. Dissolve each tablet in 2-3 tbsp warm water, swish and swallow. 90 tablet 2  . Wound Dressings (MEDIHONEY WOUND/BURN DRESSING) PSTE   0   No current facility-administered medications for this visit.    Facility-Administered Medications Ordered in Other  Visits  Medication Dose Route Frequency Provider Last Rate Last Dose  . 0.9 %  sodium chloride infusion   Intravenous Continuous Lloyd Huger, MD        OBJECTIVE: There were no vitals filed for this visit.   There is no height or weight on file to calculate BMI.    ECOG FS:1 - Symptomatic but completely ambulatory  General: Well-developed, well-nourished, no acute distress. Eyes: Pink conjunctiva, anicteric sclera. HEENT: Normocephalic, moist mucous membranes, clear oropharnyx. Lungs: Clear to auscultation bilaterally. Heart: Regular rate and rhythm. No rubs, murmurs, or gallops. Abdomen: Soft, nontender, nondistended. No organomegaly noted, normoactive bowel sounds. Musculoskeletal: No edema, cyanosis, or clubbing. Neuro: Alert, answering all questions appropriately. Cranial nerves grossly intact. Skin: No rashes or petechiae noted. Psych: Normal affect. Lymphatics: No cervical,  calvicular, axillary or inguinal LAD.  LAB RESULTS:  Lab Results  Component Value Date   NA 134 (L) 04/17/2016   K 3.8 04/17/2016   CL 97 (L) 04/17/2016   CO2 26 04/17/2016   GLUCOSE 148 (H) 04/17/2016   BUN 12 04/17/2016   CREATININE 0.54 (L) 04/17/2016   CALCIUM 9.2 04/17/2016   PROT 6.6 04/17/2016   ALBUMIN 3.8 04/17/2016   AST 40 04/17/2016   ALT 22 04/17/2016   ALKPHOS 80 04/17/2016   BILITOT 0.3 04/17/2016   GFRNONAA >60 04/17/2016   GFRAA >60 04/17/2016    Lab Results  Component Value Date   WBC 6.6 04/17/2016   NEUTROABS 5.7 04/17/2016   HGB 9.9 (L) 04/17/2016   HCT 28.5 (L) 04/17/2016   MCV 91.9 04/17/2016   PLT 313 04/17/2016   Lab Results  Component Value Date   IRON 31 (L) 12/22/2015   TIBC 400 12/22/2015   IRONPCTSAT 8 (L) 12/22/2015    Lab Results  Component Value Date   FERRITIN 32 12/22/2015   Lab Results  Component Value Date   CA199 23,612 (H) 04/10/2016   Lab Results  Component Value Date   CEA 404.1 (H) 04/10/2016     STUDIES: No results  found.  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  PLAN:    1. 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 Patient's tumor markers are increasing. He received cycle 9 of FOLFOX plus Herceptin + Neulasta on 03/27/2016.  Patient will receive Cyramza on days 1 and 15 and Taxol on days 1, 8, and 15. Patient will have day 22 off.  Proceed with cycle 1, day 8 which will be Taxol only today. Return to clinic in 1 week for consideration of cycle 1, day 15. 2. Iron deficiency anemia: Consider IV iron in the future. 3. Recent MI: Treatment per cardiology. MUGA scan on March 20, 2016 reported an EF of 52.8%, unchanged. 4. Weight loss: Improving, monitor. 5. Dysphagia: XRT has been completed.  Patient denies any issues swallowing. 6. Leukocytosis secondary to Neulasta.  No evidence of infection. 7. Weakness and fatigue: Improving. 8. Ankle ulcer: Nonhealing, continue treatment per wound care.  Dressing intact.  Patient expressed understanding and was in agreement with this plan. He also understands that He can call clinic at any time with any questions, concerns, or complaints.    Lloyd Huger, MD   04/23/2016 10:37 PM

## 2016-04-24 ENCOUNTER — Inpatient Hospital Stay: Payer: Medicare Other

## 2016-04-24 ENCOUNTER — Inpatient Hospital Stay: Payer: Medicare Other | Admitting: Oncology

## 2016-04-27 ENCOUNTER — Encounter: Payer: Medicare Other | Admitting: Surgery

## 2016-04-27 DIAGNOSIS — L89510 Pressure ulcer of right ankle, unstageable: Secondary | ICD-10-CM | POA: Diagnosis not present

## 2016-04-27 DIAGNOSIS — E11621 Type 2 diabetes mellitus with foot ulcer: Secondary | ICD-10-CM | POA: Diagnosis not present

## 2016-04-27 DIAGNOSIS — E441 Mild protein-calorie malnutrition: Secondary | ICD-10-CM | POA: Diagnosis not present

## 2016-04-27 DIAGNOSIS — C155 Malignant neoplasm of lower third of esophagus: Secondary | ICD-10-CM | POA: Diagnosis not present

## 2016-04-27 DIAGNOSIS — D649 Anemia, unspecified: Secondary | ICD-10-CM | POA: Diagnosis not present

## 2016-04-27 DIAGNOSIS — L89512 Pressure ulcer of right ankle, stage 2: Secondary | ICD-10-CM | POA: Diagnosis not present

## 2016-04-27 DIAGNOSIS — I10 Essential (primary) hypertension: Secondary | ICD-10-CM | POA: Diagnosis not present

## 2016-04-27 NOTE — Progress Notes (Addendum)
QUENTEN, NAWAZ ([id number]) Visit Report for 04/27/2016 Allergy List Details Patient Name: [name], [name]. Date of Service: 04/27/2016 10:30 AM Medical Record Number: [id number] Patient Account Number: [id number] Date of Birth/Sex: [birth year] (76 y.o. Male) Treating RN: Baruch Gouty, RN, BSN, Velva Harman Primary Care Tiasha Helvie: LADA, Rip Harbour Other Clinician: Referring Cinda Hara: LADA, [place] Treating Fernand Sorbello/Extender: Frann Rider in Treatment: 6 Allergies Active Allergies sufa drugs Cephalosporins lisinopril losartin Keflex amoxicillin ampicillin Septra Bactrim Allergy Notes Electronic Signature(s) Signed: 04/27/2016 5:22:55 PM By: Regan Lemming BSN, RN Entered By: Regan Lemming on 04/27/2016 10:56:01 [name] ([id number]) -------------------------------------------------------------------------------- [place] Details Patient Name: [name] Date of Service: 04/27/2016 10:30 AM Medical Record Number: [id number] Patient Account Number: [id number] Date of Birth/Sex: 12/09/1940 (76 y.o. Male) Treating RN: Afful, RN, BSN, [place] Primary Care Season Astacio: LADA, [facility] Other Clinician: Referring Mahum Betten: LADA, [place] Treating Lorie Melichar/Extender: Frann Rider in Treatment: 6 Visit Information Patient Arrived: Ambulatory Arrival Time: 10:37 Accompanied By: wife Transfer Assistance: None Patient Identification Verified: Yes Secondary Verification Process Yes Completed: Patient Requires Transmission- No Based Precautions: Patient Has Alerts: Yes Patient Alerts: Patient on Blood Thinner DM II 81mg  aspirin twice daily History Since Last Visit All ordered tests and consults were completed: No Added or deleted any medications: No Any new allergies or adverse reactions: No Had a fall or experienced change in activities of daily living that may affect risk of falls: No Signs or symptoms of abuse/neglect since last visito No Hospitalized since last visit:  No Has Dressing in Place as Prescribed: Yes Electronic Signature(s) Signed: 04/27/2016 10:38:54 AM By: Regan Lemming BSN, RN Entered By: Regan Lemming on 04/27/2016 10:38:52 [name] ([id number]) -------------------------------------------------------------------------------- Encounter Discharge Information Details Patient Name: [name] Date of Service: 04/27/2016 10:30 AM Medical Record Number: [id number] Patient Account Number: [id number] Date of Birth/Sex: 25-Mar-1940 (76 y.o. Male) Treating RN: Baruch Gouty, RN, BSN, Velva Harman Primary Care Donne Robillard: LADA, Rip Harbour Other Clinician: Referring Maite Burlison: LADA, [place] Treating Kyrstan Gotwalt/Extender: Frann Rider in Treatment: 6 Encounter Discharge Information Items Discharge Pain Level: 0 Discharge Condition: Stable Ambulatory Status: Ambulatory Discharge Destination: Home Transportation: Private Auto Accompanied By: wife Schedule Follow-up Appointment: No Medication Reconciliation completed and provided to Patient/Care No Glendine Swetz: Provided on Clinical Summary of Care: 04/27/2016 Form Type Recipient Paper Patient FW Electronic Signature(s) Signed: 04/27/2016 5:22:55 PM By: Regan Lemming BSN, RN Previous Signature: 04/27/2016 11:03:24 AM Version By: Ruthine Dose Entered By: Regan Lemming on 04/27/2016 11:03:53 [name] ([id number]) -------------------------------------------------------------------------------- Lower Extremity Assessment Details Patient Name: [name] Date of Service: 04/27/2016 10:30 AM Medical Record Number: [id number] Patient Account Number: [id number] Date of Birth/Sex: February 06, 1941 (76 y.o. Male) Treating RN: Baruch Gouty, RN, BSN, Velva Harman Primary Care Eron Staat: LADA, Rip Harbour Other Clinician: Referring Jalani Rominger: LADA, [place] Treating Hagen Bohorquez/Extender: Frann Rider in Treatment: 6 Vascular Assessment Claudication: Claudication Assessment [Right:None] Pulses: Dorsalis Pedis Palpable:  [Right:Yes] Posterior Tibial Extremity colors, hair growth, and conditions: Extremity Color: [Right:Mottled] Hair Growth on Extremity: [Right:Yes] Temperature of Extremity: [Right:Warm] Capillary Refill: [Right:< 3 seconds] Toe Nail Assessment Left: Right: Discolored: No Electronic Signature(s) Signed: 04/27/2016 10:47:41 AM By: Regan Lemming BSN, RN Entered By: Regan Lemming on 04/27/2016 10:47:40 [name] ([id number]) -------------------------------------------------------------------------------- Multi Wound Chart Details Patient Name: [name] Date of Service: 04/27/2016 10:30 AM Medical Record Number: [id number] Patient Account Number: [id number] Date of Birth/Sex: 01/10/1941 (76 y.o. Male) Treating RN: Baruch Gouty, RN, BSN, Velva Harman Primary Care Rudolph Dobler: LADA, Rip Harbour Other Clinician: Referring Kayhan Boardley: LADA, [place] Treating Daronte Shostak/Extender: Frann Rider in Treatment: 6  Vital Signs Height(in): 67 Pulse(bpm): 75 Weight(lbs): 130.45 Blood Pressure 148/62 (mmHg): Body Mass Index(BMI): 20 Temperature(F): 97.9 Respiratory Rate 16 (breaths/min): Photos: [1:No Photos] [N/A:N/A] Wound Location: [1:Right Malleolus - Lateral] [N/A:N/A] Wounding Event: [1:Gradually Appeared] [N/A:N/A] Primary Etiology: [1:Pressure Ulcer] [N/A:N/A] Secondary Etiology: [1:Diabetic Wound/Ulcer of the Lower Extremity] [N/A:N/A] Comorbid History: [1:Cataracts, Chronic sinus problems/congestion, Anemia, Sleep Apnea, Hypertension, Myocardial Infarction, Type II Diabetes, Received Chemotherapy, Received Radiation] [N/A:N/A] Date Acquired: [1:01/09/2016] [N/A:N/A] Weeks of Treatment: [1:6] [N/A:N/A] Wound Status: [1:Open] [N/A:N/A] Measurements L x W x D 1x1x0.3 [N/A:N/A] (cm) Area (cm) : [1:0.785] [N/A:N/A] Volume (cm) : [1:0.236] [N/A:N/A] % Reduction in Area: [1:-232.60%] [N/A:N/A] % Reduction in Volume: -883.30% [N/A:N/A] Classification: [1:Unstageable/Unclassified]  [N/A:N/A] HBO Classification: [1:Grade 1] [N/A:N/A] Exudate Amount: [1:Medium] [N/A:N/A] Exudate Type: [1:Serous] [N/A:N/A] Exudate Color: [1:amber] [N/A:N/A] Wound Margin: [1:Flat and Intact] [N/A:N/A] Granulation Amount: [1:None Present (0%)] [N/A:N/A] Necrotic Amount: Large (67-100%) N/A N/A Exposed Structures: Fat Layer (Subcutaneous N/A N/A Tissue) Exposed: Yes Fascia: No Tendon: No Muscle: No Joint: No Bone: No Epithelialization: None N/A N/A Debridement: Debridement (24401- N/A N/A 11047) Pre-procedure 10:51 N/A N/A Verification/Time Out Taken: Pain Control: Lidocaine 4% Topical N/A N/A Solution Tissue Debrided: Fibrin/Slough, N/A N/A Subcutaneous Level: Skin/Subcutaneous N/A N/A Tissue Debridement Area (sq 1 N/A N/A cm): Instrument: Curette N/A N/A Bleeding: Minimum N/A N/A Hemostasis Achieved: Pressure N/A N/A Procedural Pain: 0 N/A N/A Post Procedural Pain: 0 N/A N/A Debridement Treatment Procedure was tolerated N/A N/A Response: well Post Debridement 1x1x0.3 N/A N/A Measurements L x W x D (cm) Post Debridement 0.236 N/A N/A Volume: (cm) Post Debridement Unstageable/Unclassified N/A N/A Stage: Periwound Skin Texture: Induration: Yes N/A N/A Periwound Skin Maceration: Yes N/A N/A Moisture: Dry/Scaly: No Periwound Skin Color: Erythema: Yes N/A N/A Erythema Location: Circumferential N/A N/A Erythema Change: Decreased N/A N/A Temperature: No Abnormality N/A N/A Tenderness on Yes N/A N/A Palpation: Wound Preparation: Ulcer Cleansing: N/A N/A Rinsed/Irrigated with Saline Topical Anesthetic [name], [name] ([id number]) Applied: Other: lidociane 4% Procedures Performed: Debridement N/A N/A Treatment Notes Wound #1 (Right, Lateral Malleolus) 1. Cleansed with: Clean wound with Normal Saline 4. Dressing Applied: Santyl Ointment 5. Secondary Dressing Applied Bordered Foam Dressing Dry Gauze Electronic Signature(s) Signed: 04/27/2016 11:15:20 AM  By: Christin Fudge MD, FACS Entered By: Christin Fudge on 04/27/2016 11:15:18 [name] ([id number]) -------------------------------------------------------------------------------- [place] Details Patient Name: [name], [name] Date of Service: 04/27/2016 10:30 AM Medical Record Number: [id number] Patient Account Number: [id number] Date of Birth/Sex: 02/15/1940 (76 y.o. Male) Treating RN: Baruch Gouty, RN, BSN, Velva Harman Primary Care Merit Maybee: LADA, Rip Harbour Other Clinician: Referring Emalee Knies: LADA, [place] Treating Imogine Carvell/Extender: Frann Rider in Treatment: 6 Active Inactive ` Orientation to the Wound Care Program Nursing Diagnoses: Knowledge deficit related to the wound healing center program Goals: Patient/caregiver will verbalize understanding of the [place] Program Date Initiated: 03/10/2016 Target Resolution Date: 03/23/2016 Goal Status: Active Interventions: Provide education on orientation to the wound center Notes: ` Soft Tissue Infection Nursing Diagnoses: Impaired tissue integrity Potential for infection: soft tissue Goals: Patient will remain free of wound infection Date Initiated: 03/10/2016 Target Resolution Date: 03/17/2016 Goal Status: Active Interventions: Assess signs and symptoms of infection every visit Notes: ` Wound/Skin Impairment Nursing Diagnoses: Impaired tissue integrity [name], [name] ([id number]) Goals: Ulcer/skin breakdown will heal within 14 weeks Date Initiated: 03/10/2016 Target Resolution Date: 03/24/2016 Goal Status: Active Interventions: Assess patient/caregiver ability to obtain necessary supplies Notes: Electronic Signature(s) Signed: 04/27/2016 5:22:55 PM By: Regan Lemming BSN, RN Entered By: Regan Lemming on  04/27/2016 10:49:23 [name] ([id number]) -------------------------------------------------------------------------------- Pain Assessment Details Patient Name: [name], [name]. Date  of Service: 04/27/2016 10:30 AM Medical Record Number: [id number] Patient Account Number: [id number] Date of Birth/Sex: September 04, 1940 (76 y.o. Male) Treating RN: Baruch Gouty, RN, BSN, Velva Harman Primary Care Faatima Tench: LADA, Rip Harbour Other Clinician: Referring Tori Dattilio: LADA, [place] Treating Marrio Scribner/Extender: Frann Rider in Treatment: 6 Active Problems Location of Pain Severity and Description of Pain Patient Has Paino Yes Site Locations Pain Location: Pain in Ulcers Rate the pain. Current Pain Level: 4 Character of Pain Describe the Pain: Burning, Tender Pain Management and Medication Current Pain Management: How does your wound impact your activities of daily livingo Sleep: Yes Bathing: No Appetite: Yes Relationship With Others: Yes Bladder Continence: No Emotions: Yes Bowel Continence: No Work: No Toileting: Yes Drive: No Dressing: Yes Hobbies: Yes Electronic Signature(s) Signed: 04/27/2016 10:48:16 AM By: Regan Lemming BSN, RN Entered By: Regan Lemming on 04/27/2016 10:48:16 [name] ([id number]) -------------------------------------------------------------------------------- Patient/Caregiver Education Details Patient Name: [name] Date of Service: 04/27/2016 10:30 AM Medical Record Number: [id number] Patient Account Number: [id number] Date of Birth/Gender: 1940/02/28 (76 y.o. Male) Treating RN: Baruch Gouty, RN, BSN, Velva Harman Primary Care Physician: LADA, Rip Harbour Other Clinician: Referring Physician: LADA, [place] Treating Physician/Extender: Frann Rider in Treatment: 6 Education Assessment Education Provided To: Patient Education Topics Provided Welcome To [facility]: Methods: Explain/Verbal Responses: State content correctly Wound Debridement: Methods: Explain/Verbal Responses: State content correctly Wound/Skin Impairment: Methods: Explain/Verbal Responses: State content correctly Electronic Signature(s) Signed: 04/27/2016 5:22:55 PM By:  Regan Lemming BSN, RN Entered By: Regan Lemming on 04/27/2016 11:04:11 [name] ([id number]) -------------------------------------------------------------------------------- Wound Assessment Details Patient Name: [name] Date of Service: 04/27/2016 10:30 AM Medical Record Number: [id number] Patient Account Number: [id number] Date of Birth/Sex: [birth year] (76 y.o. Male) Treating RN: Afful, RN, BSN, [place] Primary Care Ceaira Ernster: LADA, Rip Harbour Other Clinician: Referring Heath Tesler: LADA, [place] Treating Keyle Doby/Extender: Frann Rider in Treatment: 6 Wound Status Wound Number: 1 Primary Pressure Ulcer Etiology: Wound Location: Right Malleolus - Lateral Secondary Diabetic Wound/Ulcer of the Lower Wounding Event: Gradually Appeared Etiology: Extremity Date Acquired: 01/09/2016 Wound Open Weeks Of Treatment: 6 Status: Clustered Wound: No Comorbid Cataracts, Chronic sinus History: problems/congestion, Anemia, Sleep Apnea, Hypertension, Myocardial Infarction, Type II Diabetes, Received Chemotherapy, Received Radiation Photos Photo Uploaded By: Regan Lemming on 04/27/2016 17:22:12 Wound Measurements Length: (cm) 1 Width: (cm) 1 Depth: (cm) 0.3 Area: (cm) 0.785 Volume: (cm) 0.236 % Reduction in Area: -232.6% % Reduction in Volume: -883.3% Epithelialization: None Tunneling: No Undermining: No Wound Description Classification: Unstageable/Unclassified Foul Od Diabetic Severity (Wagner): Grade 1 Slough/ Wound Margin: Flat and Intact Exudate Amount: Medium Exudate Type: Serous Exudate Color: amber [name], [name] ([id number]) or After Cleansing: No Fibrino Yes Wound Bed Granulation Amount: None Present (0%) Exposed Structure Necrotic Amount: Large (67-100%) Fascia Exposed: No Necrotic Quality: Adherent Slough Fat Layer (Subcutaneous Tissue) Exposed: Yes Tendon Exposed: No Muscle Exposed: No Joint Exposed: No Bone Exposed: No Periwound Skin  Texture Texture Color No Abnormalities Noted: No No Abnormalities Noted: No Induration: Yes Erythema: Yes Erythema Location: Circumferential Moisture Erythema Change: Decreased No Abnormalities Noted: No Dry / Scaly: No Temperature / Pain Maceration: Yes Temperature: No Abnormality Tenderness on Palpation: Yes Wound Preparation Ulcer Cleansing: Rinsed/Irrigated with Saline Topical Anesthetic Applied: Other: lidociane 4%, Treatment Notes Wound #1 (Right, Lateral Malleolus) 1. Cleansed with: Clean wound with Normal Saline 4. Dressing Applied: Santyl Ointment 5. Secondary Dressing Applied Bordered Foam Dressing Dry Gauze Electronic Signature(s) Signed:  04/27/2016 5:22:55 PM By: Regan Lemming BSN, RN Entered By: Regan Lemming on 04/27/2016 10:46:29 [name] ([id number]) -------------------------------------------------------------------------------- [place] Details Patient Name: [name] Date of Service: 04/27/2016 10:30 AM Medical Record Number: [id number] Patient Account Number: [id number] Date of Birth/Sex: 04/23/1940 (76 y.o. Male) Treating RN: Afful, RN, BSN, [place] Primary Care Coraima Tibbs: LADA, Rip Harbour Other Clinician: Referring Mariabella Nilsen: LADA, [place] Treating Jamelyn Bovard/Extender: Frann Rider in Treatment: 6 Vital Signs Time Taken: 10:45 Temperature (F): 97.9 Height (in): 67 Pulse (bpm): 75 Weight (lbs): 130.45 Respiratory Rate (breaths/min): 16 Body Mass Index (BMI): 20.4 Blood Pressure (mmHg): 148/62 Reference Range: 80 - 120 mg / dl Electronic Signature(s) Signed: 04/27/2016 5:22:55 PM By: Regan Lemming BSN, RN Entered By: Regan Lemming on 04/27/2016 10:45:28

## 2016-04-28 NOTE — Progress Notes (Signed)
DOUGLASS, DUNSHEE ([id number]) Visit Report for 04/27/2016 Chief Complaint Document Details Patient Name: [name], [name]. Date of Service: 04/27/2016 10:30 AM Medical Record Number: [id number] Patient Account Number: [id number] Date of Birth/Sex: 10-Dec-1940 (76 y.o. Male) Treating RN: Baruch Gouty, RN, BSN, Velva Harman Primary Care Provider: LADA, Rip Harbour Other Clinician: Referring Provider: LADA, [place] Treating Provider/Extender: Frann Rider in Treatment: 6 Information Obtained from: Patient Chief Complaint Patient is at the clinic for treatment of an open pressure ulcer to the right lateral ankle which she's had for about 2 months Electronic Signature(s) Signed: 04/27/2016 11:15:49 AM By: Christin Fudge MD, FACS Entered By: Christin Fudge on 04/27/2016 11:15:47 [name] ([id number]) -------------------------------------------------------------------------------- Debridement Details Patient Name: [name] Date of Service: 04/27/2016 10:30 AM Medical Record Number: [id number] Patient Account Number: [id number] Date of Birth/Sex: [birth year] (76 y.o. Male) Treating RN: Baruch Gouty, RN, BSN, [place] Primary Care Provider: LADA, Rip Harbour Other Clinician: Referring Provider: LADA, [employer] Treating Provider/Extender: Frann Rider in Treatment: 6 Debridement Performed for Wound #1 Right,Lateral Malleolus Assessment: Performed By: Physician Christin Fudge, MD Debridement: Debridement Pre-procedure Yes - 10:51 Verification/Time Out Taken: Start Time: 10:51 Pain Control: Lidocaine 4% Topical Solution Level: Skin/Subcutaneous Tissue Total Area Debrided (L x 1 (cm) x 1 (cm) = 1 (cm) W): Tissue and other Fibrin/Slough, Subcutaneous material debrided: Instrument: Curette Bleeding: Minimum Hemostasis Achieved: Pressure End Time: 10:53 Procedural Pain: 0 Post Procedural Pain: 0 Response to Treatment: Procedure was tolerated well Post Debridement Measurements of Total  Wound Length: (cm) 1 Stage: Unstageable/Unclassified Width: (cm) 1 Depth: (cm) 0.3 Volume: (cm) 0.236 Character of Wound/Ulcer Post Requires Further Debridement: Debridement Severity of Tissue Post Fat layer exposed Debridement: Post Procedure Diagnosis Same as Pre-procedure Electronic Signature(s) Signed: 04/27/2016 11:15:35 AM By: Christin Fudge MD, FACS Signed: 04/27/2016 5:22:55 PM By: Regan Lemming BSN, RN Entered By: Christin Fudge on 04/27/2016 11:15:34 [name] ([name], [name] ([id number]) -------------------------------------------------------------------------------- HPI Details Patient Name: [name], [name]. Date of Service: 04/27/2016 10:30 AM Medical Record Number: [id number] Patient Account Number: [id number] Date of Birth/Sex: 02-Nov-1940 (76 y.o. Male) Treating RN: Baruch Gouty, RN, BSN, Velva Harman Primary Care Provider: LADA, Rip Harbour Other Clinician: Referring Provider: LADA, [employer] Treating Provider/Extender: Frann Rider in Treatment: 6 History of Present Illness Location: Patient presents with an ulcer on the right lateral ankle. Quality: Patient reports experiencing a dull pain to affected area(s). Severity: Patient states wound are getting worse. Duration: Patient has had the wound for > 2 months prior to seeking treatment at the wound center Timing: Pain in wound is Intermittent (comes and goes Context: The wound appeared gradually over time Modifying Factors: Other treatment(s) tried include:local care as per the medical oncologist Associated Signs and Symptoms: Patient reports having:some pain and no discharge HPI Description: 76 year old patient has been referred by his medical oncologist Dr. Grayland Ormond, for a right lateral malleolus ulcer has been there for several weeks. 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 He is currently on chemotherapy with FOLFOX and Herceptin. past medical  history significant for diabetes mellitus, anemia, hypertension, sleep apnea, status post appendectomy, peripheral vascular catheterization( Port placement) in September 2017 by Dr. Delana Meyer. He is also receiving palliative radiation therapy and was seen by Dr. Donella Stade. 03/17/2016 -- - x-ray of the right ankle -- IMPRESSION: 1. Soft tissue wound noted over the lateral malleolus, no underlying bony abnormality. No acute bony abnormality. 2. Diffuse degenerative change. 3. Peripheral vascular disease. Electronic Signature(s) Signed: 04/27/2016 11:15:58  AM By: Christin Fudge MD, FACS Entered By: Christin Fudge on 04/27/2016 11:15:57 [name] ([id number]) -------------------------------------------------------------------------------- Physical Exam Details Patient Name: [name], [name] Date of Service: 04/27/2016 10:30 AM Medical Record Number: [id number] Patient Account Number: [id number] Date of Birth/Sex: Nov 29, 1940 (76 y.o. Male) Treating RN: Baruch Gouty, RN, BSN, Velva Harman Primary Care Provider: LADA, Rip Harbour Other Clinician: Referring Provider: LADA, [place] Treating Provider/Extender: Frann Rider in Treatment: 6 Constitutional . Pulse regular. Respirations normal and unlabored. Afebrile. . Eyes Nonicteric. Reactive to light. Ears, Nose, Mouth, and Throat Lips, teeth, and gums WNL.Marland Kitchen Moist mucosa without lesions. Neck supple and nontender. No palpable supraclavicular or cervical adenopathy. Normal sized without goiter. Respiratory WNL. No retractions.. Breath sounds WNL, No rubs, rales, rhonchi, or wheeze.. Cardiovascular Heart rhythm and rate regular, no murmur or gallop.. Pedal Pulses WNL. No clubbing, cyanosis or edema. Chest Breasts symmetical and no nipple discharge.. Breast tissue WNL, no masses, lumps, or tenderness.. Lymphatic No adneopathy. No adenopathy. No adenopathy. Musculoskeletal Adexa without tenderness or enlargement.. Digits and nails w/o clubbing, cyanosis,  infection, petechiae, ischemia, or inflammatory conditions.. Integumentary (Hair, Skin) No suspicious lesions. No crepitus or fluctuance. No peri-wound warmth or erythema. No masses.Marland Kitchen Psychiatric Judgement and insight Intact.. No evidence of depression, anxiety, or agitation.. Notes sharp debridement was done with a #3 curet and minimal bleeding controlled with pressure. There is some redness around the wound which is suggestive of a fungal infection and I will treat this appropriately Electronic Signature(s) Signed: 04/27/2016 11:16:33 AM By: Christin Fudge MD, FACS Entered By: Christin Fudge on 04/27/2016 11:16:32 [name] ([id number]) -------------------------------------------------------------------------------- Physician Orders Details Patient Name: [name] Date of Service: 04/27/2016 10:30 AM Medical Record Number: [id number] Patient Account Number: [id number] Date of Birth/Sex: 11/14/1940 (76 y.o. Male) Treating RN: Baruch Gouty, RN, BSN, Velva Harman Primary Care Provider: LADA, Rip Harbour Other Clinician: Referring Provider: LADA, [place] Treating Provider/Extender: Frann Rider in Treatment: 6 Verbal / Phone Orders: No Diagnosis Coding Wound Cleansing Wound #1 Right,Lateral Malleolus o Clean wound with Normal Saline. o Cleanse wound with mild soap and water Anesthetic Wound #1 Right,Lateral Malleolus o Topical Lidocaine 4% cream applied to wound bed prior to debridement Skin Barriers/Peri-Wound Care Wound #1 Right,Lateral Malleolus o Antifungal cream - LOTRISONSE.Marland Kitchenapply around wound on reddened areas. Primary Wound Dressing Wound #1 Right,Lateral Malleolus o Santyl Ointment - in clinic o Iodosorb Ointment - at home Secondary Dressing Wound #1 Right,Lateral Malleolus o Dry Gauze o Boardered Foam Dressing Dressing Change Frequency Wound #1 Right,Lateral Malleolus o Change dressing every day. Follow-up Appointments Wound #1 Right,Lateral  Malleolus o Return Appointment in 1 week. Off-Loading Wound #1 Right,Lateral Malleolus o Other: - Keep pressure off the malleolus [name], [name] ([id number]) Additional Orders / Instructions Wound #1 Right,Lateral Malleolus o Increase protein intake. o Activity as tolerated Medications-please add to medication list. Wound #1 Right,Lateral Malleolus o Other: - Include these in your diet...VITAMIN A, C, ZINC, MVI Electronic Signature(s) Signed: 04/27/2016 4:33:40 PM By: Christin Fudge MD, FACS Entered By: Christin Fudge on 04/27/2016 11:16:48 [name] ([id number]) -------------------------------------------------------------------------------- Problem List Details Patient Name: [name], [name] Date of Service: 04/27/2016 10:30 AM Medical Record Number: [id number] Patient Account Number: [id number] Date of Birth/Sex: [birth year] (76 y.o. Male) Treating RN: Baruch Gouty, RN, BSN, Velva Harman Primary Care Provider: LADA, Rip Harbour Other Clinician: Referring Provider: LADA, [place] Treating Provider/Extender: Frann Rider in Treatment: 6 Active Problems Inactive Problems Resolved Problems Electronic Signature(s) Signed: 04/27/2016 11:15:09 AM By: Christin Fudge MD, FACS Entered By: Christin Fudge on 04/27/2016  11:15:09 COLYN, MIRON ([id number]) -------------------------------------------------------------------------------- Progress Note Details Patient Name: [name], [name]. Date of Service: 04/27/2016 10:30 AM Medical Record Number: [id number] Patient Account Number: [id number] Date of Birth/Sex: [birth year] (76 y.o. Male) Treating RN: Baruch Gouty, RN, BSN, Velva Harman Primary Care Provider: LADA, Rip Harbour Other Clinician: Referring Provider: LADA, MELINDA Treating Provider/Extender: Frann Rider in Treatment: 6 Subjective Chief Complaint Information obtained from Patient Patient is at the clinic for treatment of an open pressure ulcer to the right lateral ankle which she's had  for about 2 months History of Present Illness (HPI) The following HPI elements were documented for the patient's wound: Location: Patient presents with an ulcer on the right lateral ankle. Quality: Patient reports experiencing a dull pain to affected area(s). Severity: Patient states wound are getting worse. Duration: Patient has had the wound for > 2 months prior to seeking treatment at the wound center Timing: Pain in wound is Intermittent (comes and goes Context: The wound appeared gradually over time Modifying Factors: Other treatment(s) tried include:local care as per the medical oncologist Associated Signs and Symptoms: Patient reports having:some pain and no discharge 76 year old patient has been referred by his medical oncologist Dr. Grayland Ormond, for a right lateral malleolus ulcer has been there for several weeks. 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 He is currently on chemotherapy with FOLFOX and Herceptin. past medical history significant for diabetes mellitus, anemia, hypertension, sleep apnea, status post appendectomy, peripheral vascular catheterization( Port placement) in September 2017 by Dr. Delana Meyer. He is also receiving palliative radiation therapy and was seen by Dr. Donella Stade. 03/17/2016 -- - x-ray of the right ankle -- IMPRESSION: 1. Soft tissue wound noted over the lateral malleolus, no underlying bony abnormality. No acute bony abnormality. 2. Diffuse degenerative change. 3. Peripheral vascular disease. Allergies sufa drugs, Cephalosporins, lisinopril, losartin, Keflex, amoxicillin, ampicillin, Septra, Bactrim Objective [name], [name] ([id number]) Constitutional Pulse regular. Respirations normal and unlabored. Afebrile. Vitals Time Taken: 10:45 AM, Height: 67 in, Weight: 130.45 lbs, BMI: 20.4, Temperature: 97.9 F, Pulse: 75 bpm, Respiratory Rate: 16 breaths/min, Blood Pressure: 148/62  mmHg. Eyes Nonicteric. Reactive to light. Ears, Nose, Mouth, and Throat Lips, teeth, and gums WNL.Marland Kitchen Moist mucosa without lesions. Neck supple and nontender. No palpable supraclavicular or cervical adenopathy. Normal sized without goiter. Respiratory WNL. No retractions.. Breath sounds WNL, No rubs, rales, rhonchi, or wheeze.. Cardiovascular Heart rhythm and rate regular, no murmur or gallop.. Pedal Pulses WNL. No clubbing, cyanosis or edema. Chest Breasts symmetical and no nipple discharge.. Breast tissue WNL, no masses, lumps, or tenderness.. Lymphatic No adneopathy. No adenopathy. No adenopathy. Musculoskeletal Adexa without tenderness or enlargement.. Digits and nails w/o clubbing, cyanosis, infection, petechiae, ischemia, or inflammatory conditions.Marland Kitchen Psychiatric Judgement and insight Intact.. No evidence of depression, anxiety, or agitation.. General Notes: sharp debridement was done with a #3 curet and minimal bleeding controlled with pressure. There is some redness around the wound which is suggestive of a fungal infection and I will treat this appropriately Integumentary (Hair, Skin) No suspicious lesions. No crepitus or fluctuance. No peri-wound warmth or erythema. No masses.. Wound #1 status is Open. Original cause of wound was Gradually Appeared. The wound is located on the Right,Lateral Malleolus. The wound measures 1cm length x 1cm width x 0.3cm depth; 0.785cm^2 area and 0.236cm^3 volume. There is Fat Layer (Subcutaneous Tissue) Exposed exposed. There is no tunneling or undermining noted. There is a medium amount of serous drainage noted. The wound margin is flat  and intact. There is no granulation within the wound bed. There is a large (67-100%) amount of necrotic tissue within the wound bed including Adherent Slough. The periwound skin appearance exhibited: Induration, Maceration, Erythema. The periwound skin appearance did not exhibit: Dry/Scaly. The surrounding  wound [name], [name] ([id number]) skin color is noted with erythema which is circumferential. Periwound temperature was noted as No Abnormality. The periwound has tenderness on palpation. Assessment Procedures Wound #1 Wound #1 is a Pressure Ulcer located on the Right,Lateral Malleolus . There was a Skin/Subcutaneous Tissue Debridement ([id number]) debridement with total area of 1 sq cm performed by Christin Fudge, MD. with the following instrument(s): Curette including Fibrin/Slough and Subcutaneous after achieving pain control using Lidocaine 4% Topical Solution. A time out was conducted at 10:51, prior to the start of the procedure. A Minimum amount of bleeding was controlled with Pressure. The procedure was tolerated well with a pain level of 0 throughout and a pain level of 0 following the procedure. Post Debridement Measurements: 1cm length x 1cm width x 0.3cm depth; 0.236cm^3 volume. Post debridement Stage noted as Unstageable/Unclassified. Character of Wound/Ulcer Post Debridement requires further debridement. Severity of Tissue Post Debridement is: Fat layer exposed. Post procedure Diagnosis Wound #1: Same as Pre-Procedure Plan Wound Cleansing: Wound #1 Right,Lateral Malleolus: Clean wound with Normal Saline. Cleanse wound with mild soap and water Anesthetic: Wound #1 Right,Lateral Malleolus: Topical Lidocaine 4% cream applied to wound bed prior to debridement Skin Barriers/Peri-Wound Care: Wound #1 Right,Lateral Malleolus: Antifungal cream - LOTRISONSE.Marland Kitchenapply around wound on reddened areas. Primary Wound Dressing: Wound #1 Right,Lateral Malleolus: [name], [name] ([id number]) Santyl Ointment - in clinic Iodosorb Ointment - at home Secondary Dressing: Wound #1 Right,Lateral Malleolus: Dry Gauze Boardered Foam Dressing Dressing Change Frequency: Wound #1 Right,Lateral Malleolus: Change dressing every day. Follow-up Appointments: Wound #1 Right,Lateral  Malleolus: Return Appointment in 1 week. Off-Loading: Wound #1 Right,Lateral Malleolus: Other: - Keep pressure off the malleolus Additional Orders / Instructions: Wound #1 Right,Lateral Malleolus: Increase protein intake. Activity as tolerated Medications-please add to medication list.: Wound #1 Right,Lateral Malleolus: Other: - Include these in your diet...VITAMIN A, C, ZINC, MVI I have recommended: 1. Daily dressing with Iodosorb to be used. 2. generic Lotrisone to be applied around the wound 3. Offloading has been discussed as much as possible in great detail 4. Adequate protein, vitamin A, vitamin C and zinc. 5. regular visits to the wound center the patient's wife has had all questions answered and will be compliant Electronic Signature(s) Signed: 04/27/2016 11:17:24 AM By: Christin Fudge MD, FACS Entered By: Christin Fudge on 04/27/2016 11:17:23 [name] ([id number]) -------------------------------------------------------------------------------- SuperBill Details Patient Name: [name] Date of Service: 04/27/2016 Medical Record Number: [id number] Patient Account Number: [id number] Date of Birth/Sex: 1940-05-06 (76 y.o. 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 C15.5 Malignant neoplasm of lower third of esophagus Facility Procedures CPT4 Code: [id number] Description: 24235 - DEB SUBQ TISSUE 20 SQ CM/< ICD-10 Description Diagnosis E11.621 Type 2 diabetes mellitus with foot ulcer L89.512 Pressure ulcer of right ankle, stage 2 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

## 2016-05-02 ENCOUNTER — Other Ambulatory Visit: Payer: Self-pay | Admitting: *Deleted

## 2016-05-05 ENCOUNTER — Encounter: Payer: Medicare Other | Admitting: Surgery

## 2016-05-05 ENCOUNTER — Telehealth: Payer: Self-pay

## 2016-05-05 DIAGNOSIS — I10 Essential (primary) hypertension: Secondary | ICD-10-CM | POA: Diagnosis not present

## 2016-05-05 DIAGNOSIS — E11621 Type 2 diabetes mellitus with foot ulcer: Secondary | ICD-10-CM | POA: Diagnosis not present

## 2016-05-05 DIAGNOSIS — L89513 Pressure ulcer of right ankle, stage 3: Secondary | ICD-10-CM | POA: Diagnosis not present

## 2016-05-05 DIAGNOSIS — E441 Mild protein-calorie malnutrition: Secondary | ICD-10-CM | POA: Diagnosis not present

## 2016-05-05 DIAGNOSIS — C155 Malignant neoplasm of lower third of esophagus: Secondary | ICD-10-CM | POA: Diagnosis not present

## 2016-05-05 DIAGNOSIS — D649 Anemia, unspecified: Secondary | ICD-10-CM | POA: Diagnosis not present

## 2016-05-05 DIAGNOSIS — L89512 Pressure ulcer of right ankle, stage 2: Secondary | ICD-10-CM | POA: Diagnosis not present

## 2016-05-05 NOTE — Telephone Encounter (Signed)
-----   Message from Lloyd Huger, MD sent at 05/02/2016  4:57 PM EDT ----- Contact: [phone] Not done on 3/5.  Will be sure to draw them with next treatment on 3/26.   ----- Message ----- From: Luretha Murphy, CMA Sent: 05/02/2016   4:05 PM To: Lloyd Huger, MD    ----- Message ----- From: Donnita Falls, RN Sent: 05/02/2016   3:59 PM To: Luretha Murphy, CMA  This was sent to me and he is finn's patient  Thanks,  tracie  ----- Message ----- From: Elouise Munroe Sent: 05/02/2016   3:16 PM To: Lloyd Huger, MD, Donnita Falls, RN  Wife called at 315pm and was asking about his markers from 04-17-16

## 2016-05-05 NOTE — Telephone Encounter (Signed)
Called and left message on voicemail that we did not draw them this time but will draw them on the next appointment date.

## 2016-05-06 NOTE — Progress Notes (Signed)
OLUWATIMILEYIN, VIVIER ([id number]) Visit Report for 05/05/2016 Allergy List Details Patient Name: [name], [name]. Date of Service: 05/05/2016 10:30 AM Medical Record Number: [id number] Patient Account Number: [id number] Date of Birth/Sex: 10/08/1940 (76 y.o. Male) Treating RN: Baruch Gouty, RN, BSN, Velva Harman Primary Care Terea Neubauer: LADA, Rip Harbour Other Clinician: Referring Jaloni Davoli: LADA, [place] Treating Banesa Tristan/Extender: Frann Rider in Treatment: 8 Allergies Active Allergies sufa drugs Cephalosporins lisinopril losartin Keflex amoxicillin ampicillin Septra Bactrim Allergy Notes Electronic Signature(s) Signed: 05/05/2016 1:23:55 PM By: Regan Lemming BSN, RN Entered By: Regan Lemming on 05/05/2016 10:44:32 [name] ([id number]) -------------------------------------------------------------------------------- [place] Details Patient Name: [name] Date of Service: 05/05/2016 10:30 AM Medical Record Number: [id number] Patient Account Number: [id number] Date of Birth/Sex: 03-25-1940 (76 y.o. Male) Treating RN: Afful, RN, BSN, [place] Primary Care Reyli Schroth: LADA, [facility] Other Clinician: Referring Jahmel Flannagan: LADA, [place] Treating Tytus Strahle/Extender: Frann Rider in Treatment: 8 Visit Information Patient Arrived: Ambulatory Arrival Time: 10:37 Accompanied By: wife Transfer Assistance: None Patient Identification Verified: Yes Secondary Verification Process Yes Completed: Patient Requires Transmission- No Based Precautions: Patient Has Alerts: Yes Patient Alerts: Patient on Blood Thinner DM II 81mg  aspirin twice daily History Since Last Visit All ordered tests and consults were completed: No Added or deleted any medications: No Any new allergies or adverse reactions: No Had a fall or experienced change in activities of daily living that may affect risk of falls: No Signs or symptoms of abuse/neglect since last visito No Hospitalized since last visit:  No Has Dressing in Place as Prescribed: Yes Electronic Signature(s) Signed: 05/05/2016 1:23:55 PM By: Regan Lemming BSN, RN Entered By: Regan Lemming on 05/05/2016 10:37:30 [name] ([id number]) -------------------------------------------------------------------------------- Encounter Discharge Information Details Patient Name: [name] Date of Service: 05/05/2016 10:30 AM Medical Record Number: [id number] Patient Account Number: [id number] Date of Birth/Sex: 1940-02-22 (76 y.o. Male) Treating RN: Baruch Gouty, RN, BSN, Velva Harman Primary Care Aleeza [place]: LADA, Rip Harbour Other Clinician: Referring Franciscojavier Wronski: LADA, [place] Treating Joshue Badal/Extender: Frann Rider in Treatment: 8 Encounter Discharge Information Items Discharge Pain Level: 0 Discharge Condition: Stable Ambulatory Status: Ambulatory Discharge Destination: Home Transportation: Private Auto Accompanied By: wife Schedule Follow-up Appointment: No Medication Reconciliation completed and provided to Patient/Care No Shyane Fossum: Provided on Clinical Summary of Care: 05/05/2016 Form Type Recipient Paper Patient FW Electronic Signature(s) Signed: 05/05/2016 1:23:55 PM By: Regan Lemming BSN, RN Previous Signature: 05/05/2016 11:05:23 AM Version By: Ruthine Dose Entered By: Regan Lemming on 05/05/2016 11:08:10 [name] ([id number]) -------------------------------------------------------------------------------- Lower Extremity Assessment Details Patient Name: [name] Date of Service: 05/05/2016 10:30 AM Medical Record Number: [id number] Patient Account Number: [id number] Date of Birth/Sex: [birth year] (76 y.o. Male) Treating RN: Baruch Gouty, RN, BSN, Velva Harman Primary Care Emmaline Wahba: LADA, Rip Harbour Other Clinician: Referring Boyd Litaker: LADA, [place] Treating Celia Friedland/Extender: Frann Rider in Treatment: 8 Edema Assessment Assessed: [Left: No] [Right: No] Edema: [Left: N] [Right: o] Vascular  Assessment Claudication: Claudication Assessment [Right:None] Pulses: Dorsalis Pedis Palpable: [Right:Yes] Posterior Tibial Extremity colors, hair growth, and conditions: Extremity Color: [Right:Mottled] Hair Growth on Extremity: [Right:No] Temperature of Extremity: [Right:Warm] Capillary Refill: [Right:< 3 seconds] Electronic Signature(s) Signed: 05/05/2016 1:23:55 PM By: Regan Lemming BSN, RN Entered By: Regan Lemming on 05/05/2016 10:39:18 [name] ([id number]) -------------------------------------------------------------------------------- Multi Wound Chart Details Patient Name: [name] Date of Service: 05/05/2016 10:30 AM Medical Record Number: [id number] Patient Account Number: [id number] Date of Birth/Sex: 1940-08-16 (76 y.o. Male) Treating RN: Baruch Gouty, RN, BSN, Velva Harman Primary Care Zinia Innocent: LADA, Rip Harbour Other Clinician: Referring Catarina Huntley: LADA, [place] Treating Halana Deisher/Extender: Con Memos,  Errol Weeks in Treatment: 8 Vital Signs Height(in): 67 Pulse(bpm): 66 Weight(lbs): 130.45 Blood Pressure 125/46 (mmHg): Body Mass Index(BMI): 20 Temperature(F): 97.8 Respiratory Rate 16 (breaths/min): Photos: [1:No Photos] [N/A:N/A] Wound Location: [1:Right Malleolus - Lateral] [N/A:N/A] Wounding Event: [1:Gradually Appeared] [N/A:N/A] Primary Etiology: [1:Pressure Ulcer] [N/A:N/A] Secondary Etiology: [1:Diabetic Wound/Ulcer of the Lower Extremity] [N/A:N/A] Comorbid History: [1:Cataracts, Chronic sinus problems/congestion, Anemia, Sleep Apnea, Hypertension, Myocardial Infarction, Type II Diabetes, Received Chemotherapy, Received Radiation] [N/A:N/A] Date Acquired: [1:01/09/2016] [N/A:N/A] Weeks of Treatment: [1:8] [N/A:N/A] Wound Status: [1:Open] [N/A:N/A] Measurements L x W x D 1x1x0.3 [N/A:N/A] (cm) Area (cm) : [1:0.785] [N/A:N/A] Volume (cm) : [1:0.236] [N/A:N/A] % Reduction in Area: [1:-232.60%] [N/A:N/A] % Reduction in Volume: -883.30%  [N/A:N/A] Classification: [1:Category/Stage III] [N/A:N/A] HBO Classification: [1:Grade 1] [N/A:N/A] Exudate Amount: [1:Medium] [N/A:N/A] Exudate Type: [1:Serous] [N/A:N/A] Exudate Color: [1:amber] [N/A:N/A] Wound Margin: [1:Flat and Intact] [N/A:N/A] Granulation Amount: [1:None Present (0%)] [N/A:N/A] Necrotic Amount: Large (67-100%) N/A N/A Exposed Structures: Fat Layer (Subcutaneous N/A N/A Tissue) Exposed: Yes Fascia: No Tendon: No Muscle: No Joint: No Bone: No Epithelialization: None N/A N/A Debridement: Debridement (16109- N/A N/A 11047) Pre-procedure 10:58 N/A N/A Verification/Time Out Taken: Pain Control: Lidocaine 4% Topical N/A N/A Solution Tissue Debrided: Fibrin/Slough, N/A N/A Subcutaneous Level: Skin/Subcutaneous N/A N/A Tissue Debridement Area (sq 1 N/A N/A cm): Instrument: Curette N/A N/A Bleeding: Minimum N/A N/A Hemostasis Achieved: Pressure N/A N/A Procedural Pain: 0 N/A N/A Post Procedural Pain: 0 N/A N/A Debridement Treatment Procedure was tolerated N/A N/A Response: well Post Debridement 1x1x0.3 N/A N/A Measurements L x W x D (cm) Post Debridement 0.236 N/A N/A Volume: (cm) Post Debridement Category/Stage III N/A N/A Stage: Periwound Skin Texture: Induration: Yes N/A N/A Periwound Skin Maceration: Yes N/A N/A Moisture: Dry/Scaly: No Periwound Skin Color: Erythema: Yes N/A N/A Erythema Location: Circumferential N/A N/A Erythema Change: Decreased N/A N/A Temperature: No Abnormality N/A N/A Tenderness on Yes N/A N/A Palpation: Wound Preparation: Ulcer Cleansing: N/A N/A Rinsed/Irrigated with Saline Topical Anesthetic [name], [name] ([id number]) Applied: Other: lidociane 4% Procedures Performed: Debridement N/A N/A Treatment Notes Electronic Signature(s) Signed: 05/05/2016 11:02:28 AM By: Christin Fudge MD, FACS Entered By: Christin Fudge on 05/05/2016 11:02:28 [name]  ([id number]) -------------------------------------------------------------------------------- [place] Details Patient Name: [name], [name] Date of Service: 05/05/2016 10:30 AM Medical Record Number: [id number] Patient Account Number: [id number] Date of Birth/Sex: 08/05/1940 (76 y.o. Male) Treating RN: Baruch Gouty, RN, BSN, Velva Harman Primary Care Kevina Piloto: LADA, Rip Harbour Other Clinician: Referring Avayah Raffety: LADA, [place] Treating Rahiem Schellinger/Extender: Frann Rider in Treatment: 8 Active Inactive ` Orientation to the Wound Care Program Nursing Diagnoses: Knowledge deficit related to the wound healing center program Goals: Patient/caregiver will verbalize understanding of the [place] Program Date Initiated: 03/10/2016 Target Resolution Date: 03/23/2016 Goal Status: Active Interventions: Provide education on orientation to the wound center Notes: ` Soft Tissue Infection Nursing Diagnoses: Impaired tissue integrity Potential for infection: soft tissue Goals: Patient will remain free of wound infection Date Initiated: 03/10/2016 Target Resolution Date: 03/17/2016 Goal Status: Active Interventions: Assess signs and symptoms of infection every visit Notes: ` Wound/Skin Impairment Nursing Diagnoses: Impaired tissue integrity [name], [name] ([id number]) Goals: Ulcer/skin breakdown will heal within 14 weeks Date Initiated: 03/10/2016 Target Resolution Date: 03/24/2016 Goal Status: Active Interventions: Assess patient/caregiver ability to obtain necessary supplies Notes: Electronic Signature(s) Signed: 05/05/2016 1:23:55 PM By: Regan Lemming BSN, RN Entered By: Regan Lemming on 05/05/2016 10:48:03 [name] ([id number]) -------------------------------------------------------------------------------- Non-Wound Condition Assessment Details Patient Name: [name] Date of Service: 05/05/2016  10:30 AM Medical Record Number: [id number] Patient Account  Number: [id number] Date of Birth/Sex: [birth year] (76 y.o. Male) Treating RN: Afful, RN, BSN, [place] Primary Care Blu Lori: LADA, [place] Other Clinician: Referring Eira Alpert: LADA, [place] Treating Niesha Bame/Extender: Frann Rider in Treatment: 8 Non-Wound Condition: Condition: Suspected Deep Tissue Injury Location: Other: left lateral malleolus Side: Left Notes: redness to the left lateral malleolus Photos Periwound Skin Texture Texture Color No Abnormalities Noted: No No Abnormalities Noted: No Callus: No Erythema: Yes Crepitus: No Erythema Location: Circumferential Excoriation: No Temperature / Pain Friable: No Temperature: No Abnormality Induration: No Tenderness on Palpation: Yes Rash: No Scarring: No Moisture No Abnormalities Noted: No Dry / Scaly: Yes Maceration: No [name], [name] ([id number]) Electronic Signature(s) Signed: 05/05/2016 3:24:58 PM By: Regan Lemming BSN, RN Entered By: Regan Lemming on 05/05/2016 15:24:58 [name] ([id number]) -------------------------------------------------------------------------------- Pain Assessment Details Patient Name: [name] Date of Service: 05/05/2016 10:30 AM Medical Record Number: [id number] Patient Account Number: [id number] Date of Birth/Sex: [birth year] (76 y.o. Male) Treating RN: Baruch Gouty, RN, BSN, Velva Harman Primary Care Philopateer Strine: LADA, Rip Harbour Other Clinician: Referring Able Malloy: LADA, [place] Treating Athelene Hursey/Extender: Frann Rider in Treatment: 8 Active Problems Location of Pain Severity and Description of Pain Patient Has Paino No Site Locations With Dressing Change: No Pain Management and Medication Current Pain Management: Electronic Signature(s) Signed: 05/05/2016 1:23:55 PM By: Regan Lemming BSN, RN Entered By: Regan Lemming on 05/05/2016 10:37:41 [name] ([id number]) -------------------------------------------------------------------------------- Patient/Caregiver Education  Details Patient Name: [name] Date of Service: 05/05/2016 10:30 AM Medical Record Number: [id number] Patient Account Number: [id number] Date of Birth/Gender: 1941-01-13 (76 y.o. Male) Treating RN: Baruch Gouty, RN, BSN, Velva Harman Primary Care Physician: LADA, Rip Harbour Other Clinician: Referring Physician: LADA, [place] Treating Physician/Extender: Frann Rider in Treatment: 8 Education Assessment Education Provided To: Patient Education Topics Provided Basic Hygiene: Methods: Explain/Verbal Responses: State content correctly Welcome To [facility] [place]: Methods: Explain/Verbal Responses: State content correctly Wound Debridement: Methods: Explain/Verbal Responses: State content correctly Wound/Skin Impairment: Methods: Explain/Verbal Responses: State content correctly Electronic Signature(s) Signed: 05/05/2016 1:23:55 PM By: Regan Lemming BSN, RN Entered By: Regan Lemming on 05/05/2016 11:08:29 [name] ([id number]) -------------------------------------------------------------------------------- Wound Assessment Details Patient Name: [name] Date of Service: 05/05/2016 10:30 AM Medical Record Number: [id number] Patient Account Number: [id number] Date of Birth/Sex: [birth year] (76 y.o. Male) Treating RN: Afful, RN, BSN, [place] Primary Care Jahir Halt: LADA, Rip Harbour Other Clinician: Referring Arionna Hoggard: LADA, [place] Treating Abbrielle Batts/Extender: Frann Rider in Treatment: 8 Wound Status Wound Number: 1 Primary Pressure Ulcer Etiology: Wound Location: Right Malleolus - Lateral Secondary Diabetic Wound/Ulcer of the Lower Wounding Event: Gradually Appeared Etiology: Extremity Date Acquired: 01/09/2016 Wound Open Weeks Of Treatment: 8 Status: Clustered Wound: No Comorbid Cataracts, Chronic sinus History: problems/congestion, Anemia, Sleep Apnea, Hypertension, Myocardial Infarction, Type II Diabetes, Received Chemotherapy, Received  Radiation Photos Photo Uploaded By: Regan Lemming on 05/05/2016 15:23:54 Wound Measurements Length: (cm) 1 Width: (cm) 1 Depth: (cm) 0.3 Area: (cm) 0.785 Volume: (cm) 0.236 % Reduction in Area: -232.6% % Reduction in Volume: -883.3% Epithelialization: None Tunneling: No Undermining: No Wound Description 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 Severity (Wagner): Grade 1 Slough/Fibrin Wound Margin: Flat and Intact Exudate Amount: Medium Exudate Type: Serous Exudate Color: amber NIKASH, MORTENSEN ([id number]) er Cleansing: No o Yes Wound Bed Granulation Amount: None Present (0%) Exposed Structure Necrotic Amount: Large (67-100%) Fascia Exposed: No Necrotic Quality: Adherent Slough Fat Layer (Subcutaneous Tissue) Exposed: Yes Tendon Exposed: No Muscle Exposed: No Joint Exposed: No  Bone Exposed: No Periwound Skin Texture Texture Color No Abnormalities Noted: No No Abnormalities Noted: No Induration: Yes Erythema: Yes Erythema Location: Circumferential Moisture Erythema Change: Decreased No Abnormalities Noted: No Dry / Scaly: No Temperature / Pain Maceration: Yes Temperature: No Abnormality Tenderness on Palpation: Yes Wound Preparation Ulcer Cleansing: Rinsed/Irrigated with Saline Topical Anesthetic Applied: Other: lidociane 4%, Treatment Notes Wound #1 (Right, Lateral Malleolus) 1. Cleansed with: Clean wound with Normal Saline 4. Dressing Applied: Santyl Ointment 5. Secondary Dressing Applied Bordered Foam Dressing Dry Gauze Electronic Signature(s) Signed: 05/05/2016 1:23:55 PM By: Regan Lemming BSN, RN Entered By: Regan Lemming on 05/05/2016 10:57:39 [name] ([id number]) -------------------------------------------------------------------------------- [place] Details Patient Name: [name] Date of Service: 05/05/2016 10:30 AM Medical Record Number: [id number] Patient Account Number: [id number] Date of Birth/Sex: 1941/01/08  (76 y.o. Male) Treating RN: Afful, RN, BSN, [place] Primary Care Hendel Gatliff: LADA, Rip Harbour Other Clinician: Referring Ritter Helsley: LADA, [place] Treating Fronnie Urton/Extender: Frann Rider in Treatment: 8 Vital Signs Time Taken: 10:37 Temperature (F): 97.8 Height (in): 67 Pulse (bpm): 66 Weight (lbs): 130.45 Respiratory Rate (breaths/min): 16 Body Mass Index (BMI): 20.4 Blood Pressure (mmHg): 125/46 Reference Range: 80 - 120 mg / dl Electronic Signature(s) Signed: 05/05/2016 1:23:55 PM By: Regan Lemming BSN, RN Entered By: Regan Lemming on 05/05/2016 10:39:32

## 2016-05-06 NOTE — Progress Notes (Addendum)
BEDFORD, WINSOR ([id number]) Visit Report for 05/05/2016 Chief Complaint Document Details Patient Name: [name], [name]. Date of Service: 05/05/2016 10:30 AM Medical Record Number: [id number] Patient Account Number: [id number] Date of Birth/Sex: May 07, 1940 (76 y.o. Male) Treating RN: Baruch Gouty, RN, BSN, Velva Harman Primary Care Provider: LADA, Rip Harbour Other Clinician: Referring Provider: LADA, [place] Treating Provider/Extender: Frann Rider in Treatment: 8 Information Obtained from: Patient Chief Complaint Patient is at the clinic for treatment of an open pressure ulcer to the right lateral ankle which she's had for about 2 months Electronic Signature(s) Signed: 05/05/2016 11:05:47 AM By: Christin Fudge MD, FACS Entered By: Christin Fudge on 05/05/2016 11:05:47 [name] ([id number]) -------------------------------------------------------------------------------- Debridement Details Patient Name: [name] Date of Service: 05/05/2016 10:30 AM Medical Record Number: [id number] Patient Account Number: [id number] Date of Birth/Sex: 1940-09-15 (76 y.o. Male) Treating RN: Baruch Gouty, RN, BSN, Velva Harman Primary Care Provider: LADA, Rip Harbour Other Clinician: Referring Provider: LADA, [place] Treating Provider/Extender: Frann Rider in Treatment: 8 Debridement Performed for Wound #1 Right,Lateral Malleolus Assessment: Performed By: Physician Christin Fudge, MD Debridement: Debridement Pre-procedure Yes - 10:58 Verification/Time Out Taken: Start Time: 10:55 Pain Control: Lidocaine 4% Topical Solution Level: Skin/Subcutaneous Tissue Total Area Debrided (L x 1 (cm) x 1 (cm) = 1 (cm) W): Tissue and other Non-Viable, Fibrin/Slough, Subcutaneous material debrided: Instrument: Curette Bleeding: Minimum Hemostasis Achieved: Pressure End Time: 10:58 Procedural Pain: 0 Post Procedural Pain: 0 Response to Treatment: Procedure was tolerated well Post Debridement Measurements of Total  Wound Length: (cm) 1 Stage: Category/Stage III Width: (cm) 1 Depth: (cm) 0.3 Volume: (cm) 0.236 Character of Wound/Ulcer Post Requires Further Debridement: Debridement Severity of Tissue Post Fat layer exposed Debridement: Post Procedure Diagnosis Same as Pre-procedure Electronic Signature(s) Signed: 05/05/2016 11:05:42 AM By: Christin Fudge MD, FACS Signed: 05/05/2016 1:23:55 PM By: Regan Lemming BSN, RN Entered By: Christin Fudge on 05/05/2016 11:05:41 [name], [name] ([id number], [name] ([id number]) -------------------------------------------------------------------------------- HPI Details Patient Name: [name], [name]. Date of Service: 05/05/2016 10:30 AM Medical Record Number: [id number] Patient Account Number: [id number] Date of Birth/Sex: [birth year] (76 y.o. Male) Treating RN: Baruch Gouty, RN, BSN, Velva Harman Primary Care Provider: LADA, Rip Harbour Other Clinician: Referring Provider: LADA, [place] Treating Provider/Extender: Frann Rider in Treatment: 8 History of Present Illness Location: Patient presents with an ulcer on the right lateral ankle. Quality: Patient reports experiencing a dull pain to affected area(s). Severity: Patient states wound are getting worse. Duration: Patient has had the wound for > 2 months prior to seeking treatment at the wound center Timing: Pain in wound is Intermittent (comes and goes Context: The wound appeared gradually over time Modifying Factors: Other treatment(s) tried include:local care as per the medical oncologist Associated Signs and Symptoms: Patient reports having:some pain and no discharge HPI Description: 76 year old patient has been referred by his medical oncologist Dr. Grayland Ormond, for a right lateral malleolus ulcer has been there for several weeks. 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 He is currently on chemotherapy with FOLFOX and Herceptin. past medical history  significant for diabetes mellitus, anemia, hypertension, sleep apnea, status post appendectomy, peripheral vascular catheterization( Port placement) in September 2017 by Dr. Delana Meyer. He is also receiving palliative radiation therapy and was seen by Dr. Donella Stade. 03/17/2016 -- - x-ray of the right ankle -- IMPRESSION: 1. Soft tissue wound noted over the lateral malleolus, no underlying bony abnormality. No acute bony abnormality. 2. Diffuse degenerative change. 3. Peripheral vascular disease. 05/05/2016 -- the  patient's wife tells me that she has noticed a small superficial bruise on the left lateral ankle and wanted me to view this area Electronic Signature(s) Signed: 05/05/2016 11:06:15 AM By: Christin Fudge MD, FACS Entered By: Christin Fudge on 05/05/2016 [place], [place]. ([id number]) -------------------------------------------------------------------------------- Physical Exam Details Patient Name: [name] Date of Service: 05/05/2016 10:30 AM Medical Record Number: [id number] Patient Account Number: [id number] Date of Birth/Sex: 1940/12/09 (76 y.o. Male) Treating RN: Baruch Gouty, RN, BSN, Velva Harman Primary Care Provider: LADA, Rip Harbour Other Clinician: Referring Provider: LADA, [place] Treating Provider/Extender: Frann Rider in Treatment: 8 Constitutional . Pulse regular. Respirations normal and unlabored. Afebrile. . Eyes Nonicteric. Reactive to light. Ears, Nose, Mouth, and Throat Lips, teeth, and gums WNL.Marland Kitchen Moist mucosa without lesions. Neck supple and nontender. No palpable supraclavicular or cervical adenopathy. Normal sized without goiter. Respiratory WNL. No retractions.. Breath sounds WNL, No rubs, rales, rhonchi, or wheeze.. Cardiovascular Heart rhythm and rate regular, no murmur or gallop.. Pedal Pulses WNL. No clubbing, cyanosis or edema. Chest Breasts symmetical and no nipple discharge.. Breast tissue WNL, no masses, lumps, or tenderness.. Lymphatic No  adneopathy. No adenopathy. No adenopathy. Musculoskeletal Adexa without tenderness or enlargement.. Digits and nails w/o clubbing, cyanosis, infection, petechiae, ischemia, or inflammatory conditions.. Integumentary (Hair, Skin) No suspicious lesions. No crepitus or fluctuance. No peri-wound warmth or erythema. No masses.Marland Kitchen Psychiatric Judgement and insight Intact.. No evidence of depression, anxiety, or agitation.. Notes the right lateral ankle was sharply debrided with a #3 curet and a lot of the subcutaneous dense debris was removed and bleeding controlled with pressure. It does not probe down to bone. The left lateral ankle now has a superficial stage I pressure injury with no open ulceration. Electronic Signature(s) Signed: 05/05/2016 11:07:01 AM By: Christin Fudge MD, FACS Entered By: Christin Fudge on 05/05/2016 11:07:00 [name] ([id number]) -------------------------------------------------------------------------------- Physician Orders Details Patient Name: BECKER, CHRISTOPHER. Date of Service: 05/05/2016 10:30 AM Medical Record Number: [id number] Patient Account Number: [id number] Date of Birth/Sex: 1940/06/03 (76 y.o. Male) Treating RN: Baruch Gouty, RN, BSN, Velva Harman Primary Care Provider: LADA, Rip Harbour Other Clinician: Referring Provider: LADA, [place] Treating Provider/Extender: Frann Rider in Treatment: 8 Verbal / Phone Orders: No Diagnosis Coding Wound Cleansing Wound #1 Right,Lateral Malleolus o Clean wound with Normal Saline. o Cleanse wound with mild soap and water Anesthetic Wound #1 Right,Lateral Malleolus o Topical Lidocaine 4% cream applied to wound bed prior to debridement Skin Barriers/Peri-Wound Care Wound #1 Right,Lateral Malleolus o Antifungal cream - LOTRISONSE.Marland Kitchenapply around wound on reddened areas. Primary Wound Dressing Wound #1 Right,Lateral Malleolus o Santyl Ointment - in clinic o Iodosorb Ointment - at home Secondary Dressing Wound  #1 Right,Lateral Malleolus o Dry Gauze o Boardered Foam Dressing Dressing Change Frequency Wound #1 Right,Lateral Malleolus o Change dressing every day. Follow-up Appointments Wound #1 Right,Lateral Malleolus o Return Appointment in 1 week. Edema Control Wound #1 Right,Lateral Malleolus o Elevate legs to the level of the heart and pump ankles as often as possible [name], [name] ([id number]) Off-Loading Wound #1 Right,Lateral Malleolus o Other: - Keep pressure off the malleolus Additional Orders / Instructions Wound #1 Right,Lateral Malleolus o Increase protein intake. o Activity as tolerated Medications-please add to medication list. Wound #1 Right,Lateral Malleolus o Other: - Include these in your diet...VITAMIN A, C, ZINC, MVI Electronic Signature(s) Signed: 05/05/2016 1:23:55 PM By: Regan Lemming BSN, RN Signed: 05/05/2016 4:18:08 PM By: Christin Fudge MD, FACS Entered By: Regan Lemming on 05/05/2016 [place]. ([id number]) -------------------------------------------------------------------------------- Problem List  Details Patient Name: [name], [name]. Date of Service: 05/05/2016 10:30 AM Medical Record Number: [id number] Patient Account Number: [id number] Date of Birth/Sex: Oct 22, 1940 (76 y.o. Male) Treating RN: Baruch Gouty, RN, BSN, Velva Harman Primary Care Provider: LADA, Rip Harbour Other Clinician: Referring Provider: LADA, [place] Treating Provider/Extender: Frann Rider in Treatment: 8 Active Problems ICD-10 Encounter Code Description Active Date Diagnosis E11.621 Type 2 diabetes mellitus with foot ulcer 05/05/2016 Yes L89.512 Pressure ulcer of right ankle, stage 2 05/05/2016 Yes L89.521 Pressure ulcer of left ankle, stage 1 05/05/2016 Yes 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 05/05/2016 Yes Inactive Problems Resolved Problems Electronic Signature(s) Signed: 05/05/2016 11:01:53 AM By: Christin Fudge MD, FACS Previous Signature: 05/05/2016  11:01:46 AM Version By: Christin Fudge MD, FACS Entered By: Christin Fudge on 05/05/2016 11:01:53 [name] ([id number]) -------------------------------------------------------------------------------- Progress Note Details Patient Name: [name] Date of Service: 05/05/2016 10:30 AM Medical Record Number: [id number] Patient Account Number: [id number] Date of Birth/Sex: 07/03/1940 (76 y.o. Male) Treating RN: Baruch Gouty, RN, BSN, Velva Harman Primary Care Provider: LADA, Rip Harbour Other Clinician: Referring Provider: LADA, MEL[place] Treating Provider/Extender: Frann Rider in Treatment: 8 Subjective Chief Complaint Information obtained from Patient Patient is at the clinic for treatment of an open pressure ulcer to the right lateral ankle which she's had for about 2 months History of Present Illness (HPI) The following HPI elements were documented for the patient's wound: Location: Patient presents with an ulcer on the right lateral ankle. Quality: Patient reports experiencing a dull pain to affected area(s). Severity: Patient states wound are getting worse. Duration: Patient has had the wound for > 2 months prior to seeking treatment at the wound center Timing: Pain in wound is Intermittent (comes and goes Context: The wound appeared gradually over time Modifying Factors: Other treatment(s) tried include:local care as per the medical oncologist Associated Signs and Symptoms: Patient reports having:some pain and no discharge 76 year old patient has been referred by his medical oncologist Dr. Grayland Ormond, for a right lateral malleolus ulcer has been there for several weeks. 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 He is currently on chemotherapy with FOLFOX and Herceptin. past medical history significant for diabetes mellitus, anemia, hypertension, sleep apnea, status post appendectomy, peripheral vascular catheterization( Port  placement) in September 2017 by Dr. Delana Meyer. He is also receiving palliative radiation therapy and was seen by Dr. Donella Stade. 03/17/2016 -- - x-ray of the right ankle -- IMPRESSION: 1. Soft tissue wound noted over the lateral malleolus, no underlying bony abnormality. No acute bony abnormality. 2. Diffuse degenerative change. 3. Peripheral vascular disease. 05/05/2016 -- the patient's wife tells me that she has noticed a small superficial bruise on the left lateral ankle and wanted me to view this area Allergies sufa drugs, Cephalosporins, lisinopril, losartin, Keflex, amoxicillin, ampicillin, Septra, Bactrim [name], [name] ([id number]) Objective Constitutional Pulse regular. Respirations normal and unlabored. Afebrile. Vitals Time Taken: 10:37 AM, Height: 67 in, Weight: 130.45 lbs, BMI: 20.4, Temperature: 97.8 F, Pulse: 66 bpm, Respiratory Rate: 16 breaths/min, Blood Pressure: 125/46 mmHg. Eyes Nonicteric. Reactive to light. Ears, Nose, Mouth, and Throat Lips, teeth, and gums WNL.Marland Kitchen Moist mucosa without lesions. Neck supple and nontender. No palpable supraclavicular or cervical adenopathy. Normal sized without goiter. Respiratory WNL. No retractions.. Breath sounds WNL, No rubs, rales, rhonchi, or wheeze.. Cardiovascular Heart rhythm and rate regular, no murmur or gallop.. Pedal Pulses WNL. No clubbing, cyanosis or edema. Chest Breasts symmetical and no nipple discharge.. Breast tissue WNL, no masses, lumps, or  tenderness.. Lymphatic No adneopathy. No adenopathy. No adenopathy. Musculoskeletal Adexa without tenderness or enlargement.. Digits and nails w/o clubbing, cyanosis, infection, petechiae, ischemia, or inflammatory conditions.Marland Kitchen Psychiatric Judgement and insight Intact.. No evidence of depression, anxiety, or agitation.. General Notes: the right lateral ankle was sharply debrided with a #3 curet and a lot of the subcutaneous dense debris was removed and bleeding controlled  with pressure. It does not probe down to bone. The left lateral ankle now has a superficial stage I pressure injury with no open ulceration. Integumentary (Hair, Skin) No suspicious lesions. No crepitus or fluctuance. No peri-wound warmth or erythema. No masses.. Wound #1 status is Open. Original cause of wound was Gradually Appeared. The wound is located on the Right,Lateral Malleolus. The wound measures 1cm length x 1cm width x 0.3cm depth; 0.785cm^2 area and 0.236cm^3 volume. There is Fat Layer (Subcutaneous Tissue) Exposed exposed. There is no tunneling or undermining noted. There is a medium amount of serous drainage noted. The wound margin is flat and [name], [name] ([id number]) intact. There is no granulation within the wound bed. There is a large (67-100%) amount of necrotic tissue within the wound bed including Adherent Slough. The periwound skin appearance exhibited: Induration, Maceration, Erythema. The periwound skin appearance did not exhibit: Dry/Scaly. The surrounding wound skin color is noted with erythema which is circumferential. Periwound temperature was noted as No Abnormality. The periwound has tenderness on palpation. Other Condition(s) Patient presents with Suspected Deep Tissue Injury located on the Left left lateral malleolus. The skin appearance exhibited: Dry/Scaly, Erythema. The skin appearance did not exhibit: Callus, Crepitus, Excoriation, Friable, Induration, Maceration, Rash, Scarring. Skin temperature was noted as No Abnormality. There is tenderness on palpation. Assessment Active Problems ICD-10 E11.621 - Type 2 diabetes mellitus with foot ulcer L89.512 - Pressure ulcer of right ankle, stage 2 L89.521 - Pressure ulcer of left ankle, stage 1 E44.1 - Mild protein-calorie malnutrition Procedures Wound #1 Wound #1 is a Pressure Ulcer located on the Right,Lateral Malleolus . There was a Skin/Subcutaneous Tissue Debridement ([id number]) debridement with  total area of 1 sq cm performed by Christin Fudge, MD. with the following instrument(s): Curette to remove Non-Viable tissue/material including Fibrin/Slough and Subcutaneous after achieving pain control using Lidocaine 4% Topical Solution. A time out was conducted at 10:58, prior to the start of the procedure. A Minimum amount of bleeding was controlled with Pressure. The procedure was tolerated well with a pain level of 0 throughout and a pain level of 0 following the procedure. Post Debridement Measurements: 1cm length x 1cm width x 0.3cm depth; 0.236cm^3 volume. Post debridement Stage noted as Category/Stage III. Character of Wound/Ulcer Post Debridement requires further debridement. Severity of Tissue Post Debridement is: Fat layer exposed. Post procedure Diagnosis Wound #1: Same as Pre-Procedure [name], [name] ([id number]) Plan Wound Cleansing: Wound #1 Right,Lateral Malleolus: Clean wound with Normal Saline. Cleanse wound with mild soap and water Anesthetic: Wound #1 Right,Lateral Malleolus: Topical Lidocaine 4% cream applied to wound bed prior to debridement Skin Barriers/Peri-Wound Care: Wound #1 Right,Lateral Malleolus: Antifungal cream - LOTRISONSE.Marland Kitchenapply around wound on reddened areas. Primary Wound Dressing: Wound #1 Right,Lateral Malleolus: Santyl Ointment - in clinic Iodosorb Ointment - at home Secondary Dressing: Wound #1 Right,Lateral Malleolus: Dry Gauze Boardered Foam Dressing Dressing Change Frequency: Wound #1 Right,Lateral Malleolus: Change dressing every day. Follow-up Appointments: Wound #1 Right,Lateral Malleolus: Return Appointment in 1 week. Edema Control: Wound #1 Right,Lateral Malleolus: Elevate legs to the level of the heart and pump ankles as often as possible  Off-Loading: Wound #1 Right,Lateral Malleolus: Other: - Keep pressure off the malleolus Additional Orders / Instructions: Wound #1 Right,Lateral Malleolus: Increase protein  intake. Activity as tolerated Medications-please add to medication list.: Wound #1 Right,Lateral Malleolus: Other: - Include these in your diet...VITAMIN A, C, ZINC, MVI I have recommended: 1. Daily dressing with Iodosorb to be used. 2. generic Lotrisone to be applied around the wound LUKE, FALERO. ([id number]) 3. Offloading has been discussed as much as possible in great detail -- specially regarding the area on the left lateral ankle 4. Adequate protein, vitamin A, vitamin C and zinc. 5. regular visits to the wound center the patient's wife has had all questions answered and will be compliant Electronic Signature(s) Signed: 05/05/2016 4:19:59 PM By: Christin Fudge MD, FACS Previous Signature: 05/05/2016 11:07:55 AM Version By: Christin Fudge MD, FACS Entered By: Christin Fudge on 05/05/2016 16:19:59 [name] ([id number]) -------------------------------------------------------------------------------- SuperBill Details Patient Name: [name] Date of Service: 05/05/2016 Medical Record Number: [id number] Patient Account Number: [id number] Date of Birth/Sex: 1940-07-25 (76 y.o. Male) Treating RN: Baruch Gouty, RN, BSN, [place] Primary Care Provider: LADA, Rip Harbour Other Clinician: Referring Provider: LADA, [place] Treating Provider/Extender: Christin Fudge Service Line: Outpatient Weeks in Treatment: 8 Diagnosis Coding ICD-10 Codes Code Description E11.621 Type 2 diabetes mellitus with foot ulcer L89.512 Pressure ulcer of right ankle, stage 2 L89.521 Pressure ulcer of left ankle, stage 1 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 Facility Procedures CPT4 Code: [id number] Description: 28003 - DEB SUBQ TISSUE 20 SQ CM/< ICD-10 Description Diagnosis E11.621 Type 2 diabetes mellitus with foot ulcer L89.512 Pressure ulcer of right ankle, stage 2 L89.521 Pressure ulcer of left ankle, stage 1 E44.1 Mild protein-calorie  malnutrition Modifier: Quantity: 1 Physician Procedures CPT4 Code:  [id number] Description: 05697 - WC PHYS SUBQ TISS 20 SQ CM ICD-10 Description Diagnosis E11.621 Type 2 diabetes mellitus with foot ulcer L89.512 Pressure ulcer of right ankle, stage 2 L89.521 Pressure ulcer of left ankle, stage 1 E44.1 Mild protein-calorie  malnutrition Modifier: Quantity: 1 Electronic Signature(s) Signed: 05/05/2016 11:08:23 AM By: Christin Fudge MD, FACS Entered By: Christin Fudge on 05/05/2016 11:08:22

## 2016-05-07 NOTE — Progress Notes (Signed)
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  INTERVAL HISTORY:  Patient returns to clinic today for further evaluation and consideration of cycle 2, day 1 of Cyramza and Taxol. He missed cycle 1, day 15 secondary to whether as well as an illness with his wife therefore did not have transportation. He currently feels well and is asymptomatic. He continues to have improved ability to swallow without dysphasia. He has a good appetite and has gained weight. He has a mild peripheral neuropathy. He has no other neurologic complaints. He denies any recent fevers or illnesses. He denies any chest pain or shortness of breath. He denies any nausea, vomiting, constipation, or diarrhea. He has no urinary complaints. Patient otherwise feels well and offers no further specific complaints.  REVIEW OF SYSTEMS:   Review of Systems  Constitutional: Negative for fever, malaise/fatigue and weight loss.  Respiratory: Negative.  Negative for shortness of breath.   Cardiovascular: Negative.  Negative for chest pain and leg swelling.  Gastrointestinal: Negative.  Negative for abdominal pain, blood in stool and melena.  Genitourinary: Negative.   Musculoskeletal: Negative.   Neurological: Positive for sensory change. Negative for weakness.  Psychiatric/Behavioral: Negative.  The patient is not nervous/anxious.     As per HPI. Otherwise, a complete review of systems is negative.  PAST MEDICAL HISTORY: Past Medical History:  Diagnosis Date  .  Anemia   . Arthritis   . 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 Diabetes mellitus without complication ([place])   . Ectatic abdominal aorta ([place]) 08/12/2015  . Esophageal cancer ([place])   . GI bleed   . Hearing loss    left ear  . Hypertension   . Hypothyroidism   . Liver mass   . Mild left atrial enlargement 08/12/2015   Very mild; LA 4.2 cm; echo March 2017  . Mitral valvular regurgitation 08/12/2015  . Sleep apnea   . Stroke ([place])   . TIA (transient ischemic attack)   . 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 APPENDECTOMY    . EUS N/A 10/07/2015   Procedure: FULL UPPER ENDOSCOPIC ULTRASOUND (EUS) RADIAL;  Surgeon: Holly Bodily, MD;  Location: [facility] ENDOSCOPY;  Service: Endoscopy;  Laterality: N/A;  . EYE SURGERY    . FRACTURE SURGERY    . NOSE SURGERY    . PERIPHERAL VASCULAR CATHETERIZATION N/A 10/26/2015   Procedure: Glori Luis Cath Insertion;  Surgeon: Katha Cabal, MD;  Location: [facility];  Service: Cardiovascular;  Laterality: N/A;    FAMILY HISTORY Family History  Problem Relation Age of Onset  . Hypertension Mother   . Lung cancer Father   . Hypertension Brother        ADVANCED DIRECTIVES:    HEALTH MAINTENANCE: Social History  Substance Use Topics  . Smoking status: Former Smoker    Packs/day: 1.00    Types: Cigarettes    Quit date: 10/03/1983  . Smokeless tobacco: Never Used  . Alcohol use 0.0 oz/week  Comment: rarely     Colonoscopy:  PAP:  Bone density:  Lipid panel:  Allergies  Allergen Reactions  . Sulfa Antibiotics Itching and Hives  . Amoxicillin Hives  . Ampicillin Hives  . Keflex [Cephalexin] Itching  . Lisinopril Other (See Comments)  . Losartan Other (See Comments)  . Sulfamethoxazole-Trimethoprim Other (See Comments)  . Cephalosporins Hives    Current Outpatient Prescriptions  Medication Sig  Dispense Refill  . aspirin EC 81 MG tablet Take 81 mg by mouth 2 (two) times daily.     Marland Kitchen BIOTIN PO Take by mouth.    . Cholecalciferol (VITAMIN D3) 1000 UNITS CAPS Take 2,000 Units by mouth daily.     . Coenzyme Q10 (CO Q-10) 200 MG CAPS Take 200 mg by mouth daily.     Marland Kitchen glucose blood (FREESTYLE LITE) test strip Use 2 (two) times daily. Free style lite test strips    . isosorbide mononitrate (IMDUR) 60 MG 24 hr tablet Take 1 tablet (60 mg total) by mouth daily. 30 tablet 0  . levothyroxine (SYNTHROID, LEVOTHROID) 100 MCG tablet Take 100 mcg by mouth every morning.    . lidocaine-prilocaine (EMLA) cream Apply 1 application topically as needed. Apply to port 1-2 hours prior to chemotherapy. Cover with plastic wrap. 30 g 2  . Lutein 40 MG CAPS Take 40 mg by mouth daily.     . Magnesium Oxide 250 MG TABS Take 1 tablet (250 mg total) by mouth daily.  0  . metFORMIN (GLUCOPHAGE) 1000 MG tablet Take 2,000 mg by mouth daily.    . metoprolol tartrate (LOPRESSOR) 25 MG tablet Take 12.5 mg by mouth 2 (two) times daily.     . Misc Natural Products (BEE PROPOLIS PO) Take 1 tablet by mouth 2 (two) times daily.     . Multiple Vitamins-Minerals (MULTI FOR HIM 50+ PO) Take 1 tablet by mouth daily.    . ondansetron (ZOFRAN) 8 MG tablet Take 1 tablet (8 mg total) by mouth every 8 (eight) hours as needed for nausea or vomiting. 30 tablet 2  . prochlorperazine (COMPAZINE) 10 MG tablet Take 1 tablet (10 mg total) by mouth every 6 (six) hours as needed for nausea or vomiting. 30 tablet 2  . Red Yeast Rice 600 MG TABS Take 2 tablets by mouth at bedtime.    . sucralfate (CARAFATE) 1 g tablet Take 1 tablet (1 g total) by mouth 3 (three) times daily. Dissolve each tablet in 2-3 tbsp warm water, swish and swallow. 90 tablet 2  . Wound Dressings (MEDIHONEY WOUND/BURN DRESSING) PSTE   0   No current facility-administered medications for this visit.    Facility-Administered Medications Ordered in Other Visits  Medication  Dose Route Frequency Provider Last Rate Last Dose  . 0.9 %  sodium chloride infusion   Intravenous Continuous Lloyd Huger, MD        OBJECTIVE: There were no vitals filed for this visit.   There is no height or weight on file to calculate BMI.    ECOG FS:1 - Symptomatic but completely ambulatory  General: Well-developed, well-nourished, no acute distress. Eyes: Pink conjunctiva, anicteric sclera. HEENT: Normocephalic, moist mucous membranes, clear oropharnyx. Lungs: Clear to auscultation bilaterally. Heart: Regular rate and rhythm. No rubs, murmurs, or gallops. Abdomen: Soft, nontender, nondistended. No organomegaly noted, normoactive bowel sounds. Musculoskeletal: No edema, cyanosis, or clubbing. Neuro: Alert, answering all questions appropriately. Cranial nerves grossly intact. Skin: No rashes or petechiae noted. Psych: Normal affect. Lymphatics: No cervical,  calvicular, axillary or inguinal LAD.  LAB RESULTS:  Lab Results  Component Value Date   NA 131 (L) 05/08/2016   K 4.1 05/08/2016   CL 97 (L) 05/08/2016   CO2 27 05/08/2016   GLUCOSE 189 (H) 05/08/2016   BUN 14 05/08/2016   CREATININE 0.59 (L) 05/08/2016   CALCIUM 8.9 05/08/2016   PROT 6.8 05/08/2016   ALBUMIN 3.6 05/08/2016   AST 34 05/08/2016   ALT 18 05/08/2016   ALKPHOS 62 05/08/2016   BILITOT 0.3 05/08/2016   GFRNONAA >60 05/08/2016   GFRAA >60 05/08/2016    Lab Results  Component Value Date   WBC 9.3 05/08/2016   NEUTROABS 7.4 (H) 05/08/2016   HGB 10.1 (L) 05/08/2016   HCT 30.2 (L) 05/08/2016   MCV 90.6 05/08/2016   PLT 445 (H) 05/08/2016   Lab Results  Component Value Date   IRON 31 (L) 12/22/2015   TIBC 400 12/22/2015   IRONPCTSAT 8 (L) 12/22/2015    Lab Results  Component Value Date   FERRITIN 32 12/22/2015   Lab Results  Component Value Date   CA199 23,612 (H) 04/10/2016   Lab Results  Component Value Date   CEA 404.1 (H) 04/10/2016     STUDIES: No results  found.  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  PLAN:    1. 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 Patient's tumor markers are increasing. He received cycle 9 of FOLFOX plus Herceptin + Neulasta on 03/27/2016.  Patient will receive Cyramza on days 1 and 15 and Taxol on days 1, 8, and 15. Patient will have day 22 off.  Proceed with cycle 2, day 1 today. Patient missed cycle 1, day 15. Return to clinic in 1 week for consideration of cycle 2, day 8. 2. Iron deficiency anemia: Consider IV iron in the future. 3. Reactive disease: Treatment per cardiology. MUGA scan on March 20, 2016 reported an EF of 52.8%, unchanged. 4. Weight loss: Improving, monitor. 5. Dysphagia: XRT has been completed.  Patient denies any issues swallowing. 6. Weakness and fatigue: Improving. 7. Ankle ulcer: Nonhealing, continue treatment per wound care.  Dressing intact.  Patient expressed understanding and was in agreement with this plan. He also understands that He can call clinic at any time with any questions, concerns, or complaints.    Lloyd Huger, MD   05/08/2016 9:41 AM

## 2016-05-08 ENCOUNTER — Inpatient Hospital Stay: Payer: Medicare Other

## 2016-05-08 ENCOUNTER — Inpatient Hospital Stay (HOSPITAL_BASED_OUTPATIENT_CLINIC_OR_DEPARTMENT_OTHER): Payer: Medicare Other | Admitting: Oncology

## 2016-05-08 VITALS — BP 144/69 | HR 69 | Temp 97.1°F | Resp 18 | Wt 136.1 lb

## 2016-05-08 DIAGNOSIS — C155 Malignant neoplasm of lower third of esophagus: Secondary | ICD-10-CM

## 2016-05-08 DIAGNOSIS — Z5111 Encounter for antineoplastic chemotherapy: Secondary | ICD-10-CM | POA: Diagnosis not present

## 2016-05-08 DIAGNOSIS — I252 Old myocardial infarction: Secondary | ICD-10-CM

## 2016-05-08 DIAGNOSIS — C787 Secondary malignant neoplasm of liver and intrahepatic bile duct: Secondary | ICD-10-CM

## 2016-05-08 DIAGNOSIS — G629 Polyneuropathy, unspecified: Secondary | ICD-10-CM

## 2016-05-08 DIAGNOSIS — D509 Iron deficiency anemia, unspecified: Secondary | ICD-10-CM

## 2016-05-08 DIAGNOSIS — Z87891 Personal history of nicotine dependence: Secondary | ICD-10-CM

## 2016-05-08 DIAGNOSIS — M129 Arthropathy, unspecified: Secondary | ICD-10-CM

## 2016-05-08 DIAGNOSIS — R5383 Other fatigue: Secondary | ICD-10-CM

## 2016-05-08 DIAGNOSIS — Z8673 Personal history of transient ischemic attack (TIA), and cerebral infarction without residual deficits: Secondary | ICD-10-CM

## 2016-05-08 DIAGNOSIS — D72829 Elevated white blood cell count, unspecified: Secondary | ICD-10-CM

## 2016-05-08 DIAGNOSIS — I10 Essential (primary) hypertension: Secondary | ICD-10-CM

## 2016-05-08 DIAGNOSIS — I340 Nonrheumatic mitral (valve) insufficiency: Secondary | ICD-10-CM

## 2016-05-08 DIAGNOSIS — Z7984 Long term (current) use of oral hypoglycemic drugs: Secondary | ICD-10-CM

## 2016-05-08 DIAGNOSIS — R1310 Dysphagia, unspecified: Secondary | ICD-10-CM

## 2016-05-08 DIAGNOSIS — L97309 Non-pressure chronic ulcer of unspecified ankle with unspecified severity: Secondary | ICD-10-CM

## 2016-05-08 DIAGNOSIS — I517 Cardiomegaly: Secondary | ICD-10-CM

## 2016-05-08 DIAGNOSIS — I77811 Abdominal aortic ectasia: Secondary | ICD-10-CM

## 2016-05-08 DIAGNOSIS — G4730 Sleep apnea, unspecified: Secondary | ICD-10-CM

## 2016-05-08 DIAGNOSIS — R531 Weakness: Secondary | ICD-10-CM

## 2016-05-08 DIAGNOSIS — D649 Anemia, unspecified: Secondary | ICD-10-CM

## 2016-05-08 DIAGNOSIS — M5134 Other intervertebral disc degeneration, thoracic region: Secondary | ICD-10-CM

## 2016-05-08 DIAGNOSIS — Z79899 Other long term (current) drug therapy: Secondary | ICD-10-CM

## 2016-05-08 DIAGNOSIS — R634 Abnormal weight loss: Secondary | ICD-10-CM

## 2016-05-08 DIAGNOSIS — E119 Type 2 diabetes mellitus without complications: Secondary | ICD-10-CM

## 2016-05-08 DIAGNOSIS — Z801 Family history of malignant neoplasm of trachea, bronchus and lung: Secondary | ICD-10-CM

## 2016-05-08 DIAGNOSIS — E039 Hypothyroidism, unspecified: Secondary | ICD-10-CM

## 2016-05-08 DIAGNOSIS — Z7982 Long term (current) use of aspirin: Secondary | ICD-10-CM

## 2016-05-08 LAB — CBC WITH DIFFERENTIAL/PLATELET
Basophils Absolute: 0.1 10*3/uL (ref 0–0.1)
Basophils Relative: 1 %
Eosinophils Absolute: 0.1 10*3/uL (ref 0–0.7)
Eosinophils Relative: 1 %
HCT: 30.2 % — ABNORMAL LOW (ref 40.0–52.0)
Hemoglobin: 10.1 g/dL — ABNORMAL LOW (ref 13.0–18.0)
Lymphocytes Relative: 4 %
Lymphs Abs: 0.4 10*3/uL — ABNORMAL LOW (ref 1.0–3.6)
MCH: 30.4 pg (ref 26.0–34.0)
MCHC: 33.5 g/dL (ref 32.0–36.0)
MCV: 90.6 fL (ref 80.0–100.0)
Monocytes Absolute: 1.3 10*3/uL — ABNORMAL HIGH (ref 0.2–1.0)
Monocytes Relative: 14 %
Neutro Abs: 7.4 10*3/uL — ABNORMAL HIGH (ref 1.4–6.5)
Neutrophils Relative %: 80 %
Platelets: 445 10*3/uL — ABNORMAL HIGH (ref 150–440)
RBC: 3.33 MIL/uL — ABNORMAL LOW (ref 4.40–5.90)
RDW: 20.1 % — ABNORMAL HIGH (ref 11.5–14.5)
WBC: 9.3 10*3/uL (ref 3.8–10.6)

## 2016-05-08 LAB — COMPREHENSIVE METABOLIC PANEL
ALT: 18 U/L (ref 17–63)
AST: 34 U/L (ref 15–41)
Albumin: 3.6 g/dL (ref 3.5–5.0)
Alkaline Phosphatase: 62 U/L (ref 38–126)
Anion gap: 7 (ref 5–15)
BUN: 14 mg/dL (ref 6–20)
CO2: 27 mmol/L (ref 22–32)
Calcium: 8.9 mg/dL (ref 8.9–10.3)
Chloride: 97 mmol/L — ABNORMAL LOW (ref 101–111)
Creatinine, Ser: 0.59 mg/dL — ABNORMAL LOW (ref 0.61–1.24)
GFR calc Af Amer: >60 mL/min (ref >60)
GFR calc non Af Amer: >60 mL/min (ref >60)
Glucose, Bld: 189 mg/dL — ABNORMAL HIGH (ref 65–99)
Potassium: 4.1 mmol/L (ref 3.5–5.1)
Sodium: 131 mmol/L — ABNORMAL LOW (ref 135–145)
Total Bilirubin: 0.3 mg/dL (ref 0.3–1.2)
Total Protein: 6.8 g/dL (ref 6.5–8.1)

## 2016-05-08 LAB — TSH: TSH: 1.479 u[IU]/mL (ref 0.350–4.500)

## 2016-05-08 LAB — MAGNESIUM: Magnesium: 2.1 mg/dL (ref 1.7–2.4)

## 2016-05-08 LAB — PROTEIN, URINE, RANDOM: Total Protein, Urine: 8 mg/dL

## 2016-05-08 MED ORDER — DEXAMETHASONE SODIUM PHOSPHATE 10 MG/ML IJ SOLN
10.0000 mg | Freq: Once | INTRAMUSCULAR | Status: AC
  Administered 2016-05-08: 10 mg via INTRAVENOUS
  Filled 2016-05-08: qty 1

## 2016-05-08 MED ORDER — SODIUM CHLORIDE 0.9 % IV SOLN
8.0000 mg/kg | Freq: Once | INTRAVENOUS | Status: AC
  Administered 2016-05-08: 500 mg via INTRAVENOUS
  Filled 2016-05-08: qty 50

## 2016-05-08 MED ORDER — SODIUM CHLORIDE 0.9 % IV SOLN: 10.0000 mg | Freq: Once | INTRAVENOUS | Status: DC

## 2016-05-08 MED ORDER — SODIUM CHLORIDE 0.9 % IV SOLN
Freq: Once | INTRAVENOUS | Status: AC
  Administered 2016-05-08: 10:00:00 via INTRAVENOUS
  Filled 2016-05-08: qty 1000

## 2016-05-08 MED ORDER — DIPHENHYDRAMINE HCL 50 MG/ML IJ SOLN
25.0000 mg | Freq: Once | INTRAMUSCULAR | Status: AC
  Administered 2016-05-08: 25 mg via INTRAVENOUS
  Filled 2016-05-08: qty 1

## 2016-05-08 MED ORDER — HEPARIN SOD (PORK) LOCK FLUSH 100 UNIT/ML IV SOLN
500.0000 [IU] | Freq: Once | INTRAVENOUS | Status: AC | PRN
  Administered 2016-05-08: 500 [IU]
  Filled 2016-05-08: qty 5

## 2016-05-08 MED ORDER — DEXTROSE 5 % IV SOLN: 80.0000 mg/m2 | Freq: Once | INTRAVENOUS | Status: DC

## 2016-05-08 MED ORDER — SODIUM CHLORIDE 0.9% FLUSH
10.0000 mL | INTRAVENOUS | Status: DC | PRN
  Filled 2016-05-08: qty 10

## 2016-05-08 MED ORDER — FAMOTIDINE IN NACL 20-0.9 MG/50ML-% IV SOLN
20.0000 mg | Freq: Once | INTRAVENOUS | Status: AC
  Administered 2016-05-08: 20 mg via INTRAVENOUS
  Filled 2016-05-08: qty 50

## 2016-05-08 MED ORDER — SODIUM CHLORIDE 0.9 % IV SOLN
132.0000 mg | Freq: Once | INTRAVENOUS | Status: AC
  Administered 2016-05-08: 132 mg via INTRAVENOUS
  Filled 2016-05-08: qty 22

## 2016-05-08 MED ORDER — ACETAMINOPHEN 325 MG PO TABS
650.0000 mg | ORAL_TABLET | Freq: Once | ORAL | Status: AC
  Administered 2016-05-08: 650 mg via ORAL
  Filled 2016-05-08: qty 2

## 2016-05-08 NOTE — Progress Notes (Signed)
Nutrition Follow-up:   Patient seen during infusion this am.  Wife not present during visit.  Patient reports that he is eating better.  Typically has cereal and juice for breakfast, sandwich and shake for lunch and meat and veggies for dinner.    No nutrition issues reported per patient.    Medications: reviewed  Labs: reviewed  Anthropometrics:   Patient weight has increased to 136 pounds 1.6 oz today, from 131 pounds 2.8 oz on 3/5.   NUTRITION DIAGNOSIS: Unintentional weight loss improving   MALNUTRITION DIAGNOSIS: Severe malnutrition improving   INTERVENTION:   Encouraged patient to keep up the good work with consuming foods high in calories and protein.   Encouraged continued use of oral nutrition supplements for added nutrition    MONITORING, EVALUATION, GOAL: Patient will consume adequate calories and protein to maintain and/or increased weight gain.   NEXT VISIT: as needed  Shanequa Whitenight B. Zenia Resides, [place], [place] Registered Dietitian [phone] (pager)

## 2016-05-08 NOTE — Progress Notes (Signed)
Complains of peripheral neuropathy in both hands.

## 2016-05-08 NOTE — Addendum Note (Signed)
Addended by: Telford Nab on: 05/08/2016 09:54 AM   Modules accepted: Orders

## 2016-05-09 LAB — CEA: CEA: 594.6 ng/mL — ABNORMAL HIGH (ref 0.0–4.7)

## 2016-05-09 LAB — CANCER ANTIGEN 19-9: CA 19-9: 15733 U/mL — ABNORMAL HIGH (ref 0–35)

## 2016-05-12 ENCOUNTER — Encounter: Payer: Medicare Other | Admitting: Surgery

## 2016-05-12 DIAGNOSIS — C155 Malignant neoplasm of lower third of esophagus: Secondary | ICD-10-CM | POA: Diagnosis not present

## 2016-05-12 DIAGNOSIS — I10 Essential (primary) hypertension: Secondary | ICD-10-CM | POA: Diagnosis not present

## 2016-05-12 DIAGNOSIS — E441 Mild protein-calorie malnutrition: Secondary | ICD-10-CM | POA: Diagnosis not present

## 2016-05-12 DIAGNOSIS — D649 Anemia, unspecified: Secondary | ICD-10-CM | POA: Diagnosis not present

## 2016-05-12 DIAGNOSIS — L89513 Pressure ulcer of right ankle, stage 3: Secondary | ICD-10-CM | POA: Diagnosis not present

## 2016-05-12 DIAGNOSIS — L89512 Pressure ulcer of right ankle, stage 2: Secondary | ICD-10-CM | POA: Diagnosis not present

## 2016-05-12 DIAGNOSIS — E11621 Type 2 diabetes mellitus with foot ulcer: Secondary | ICD-10-CM | POA: Diagnosis not present

## 2016-05-13 NOTE — Progress Notes (Signed)
RYAAN, VANWAGONER ([id number]) Visit Report for 05/12/2016 Chief Complaint Document Details Patient Name: [name], [name]. Date of Service: 05/12/2016 1:30 PM Medical Record Number: [id number] Patient Account Number: [id number] Date of Birth/Sex: [birth year] (76 y.o. Male) Treating RN: Baruch Gouty, RN, BSN, Velva Harman Primary Care Provider: Enid Derry Other Clinician: Referring Provider: Enid Derry Treating Provider/Extender: Frann Rider in Treatment: 9 Information Obtained from: Patient Chief Complaint Patient is at the clinic for treatment of an open pressure ulcer to the right lateral ankle which she's had for about 2 months Electronic Signature(s) Signed: 05/12/2016 1:57:25 PM By: Christin Fudge MD, FACS Entered By: Christin Fudge on 05/12/2016 13:57:24 [name] ([id number]) -------------------------------------------------------------------------------- HPI Details Patient Name: [name] Date of Service: 05/12/2016 1:30 PM Medical Record Number: [id number] Patient Account Number: [id number] Date of Birth/Sex: 1940/11/22 (76 y.o. Male) Treating RN: Baruch Gouty, RN, BSN, Velva Harman Primary Care Provider: Enid Derry Other Clinician: Referring Provider: Enid Derry Treating Provider/Extender: Frann Rider in Treatment: 9 History of Present Illness Location: Patient presents with an ulcer on the right lateral ankle. Quality: Patient reports experiencing a dull pain to affected area(s). Severity: Patient states wound are getting worse. Duration: Patient has had the wound for > 2 months prior to seeking treatment at the wound center Timing: Pain in wound is Intermittent (comes and goes Context: The wound appeared gradually over time Modifying Factors: Other treatment(s) tried include:local care as per the medical oncologist Associated Signs and Symptoms: Patient reports having:some pain and no discharge HPI Description: 76 year old patient has been referred by his medical  oncologist Dr. Grayland Ormond, for a right lateral malleolus ulcer has been there for several weeks. 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 He is currently on chemotherapy with FOLFOX and Herceptin. past medical history significant for diabetes mellitus, anemia, hypertension, sleep apnea, status post appendectomy, peripheral vascular catheterization( Port placement) in September 2017 by Dr. Delana Meyer. He is also receiving palliative radiation therapy and was seen by Dr. Donella Stade. 03/17/2016 -- - x-ray of the right ankle -- IMPRESSION: 1. Soft tissue wound noted over the lateral malleolus, no underlying bony abnormality. No acute bony abnormality. 2. Diffuse degenerative change. 3. Peripheral vascular disease. 05/05/2016 -- the patient's wife tells me that she has noticed a small superficial bruise on the left lateral ankle and wanted me to view this area. 05/12/2016 -- the patient is unable to afford Santyl ointment and we have been struggling over the last 8 weeks to use various alternatives. I have asked them today if they would be willing to use the snap vacuum system and she will let me know soon. Electronic Signature(s) Signed: 05/12/2016 1:58:34 PM By: Christin Fudge MD, FACS Entered By: Christin Fudge on 05/12/2016 13:58:34 [name] ([id number]) -------------------------------------------------------------------------------- Physical Exam Details Patient Name: [name] Date of Service: 05/12/2016 1:30 PM Medical Record Number: [id number] Patient Account Number: [id number] Date of Birth/Sex: 1940/10/01 (76 y.o. Male) Treating RN: Baruch Gouty, RN, BSN, Velva Harman Primary Care Provider: Enid Derry Other Clinician: Referring Provider: Enid Derry Treating Provider/Extender: Frann Rider in Treatment: 9 Constitutional . Pulse regular. Respirations normal and unlabored. Afebrile. . Eyes Nonicteric. Reactive to light. Ears,  Nose, Mouth, and Throat Lips, teeth, and gums WNL.Marland Kitchen Moist mucosa without lesions. Neck supple and nontender. No palpable supraclavicular or cervical adenopathy. Normal sized without goiter. Respiratory WNL. No retractions.. Breath sounds WNL, No rubs, rales, rhonchi, or wheeze.. Cardiovascular Heart rhythm and rate regular, no murmur or  gallop.. Pedal Pulses WNL. No clubbing, cyanosis or edema. Chest Breasts symmetical and no nipple discharge.. Breast tissue WNL, no masses, lumps, or tenderness.. Lymphatic No adneopathy. No adenopathy. No adenopathy. Musculoskeletal Adexa without tenderness or enlargement.. Digits and nails w/o clubbing, cyanosis, infection, petechiae, ischemia, or inflammatory conditions.. Integumentary (Hair, Skin) No suspicious lesions. No crepitus or fluctuance. No peri-wound warmth or erythema. No masses.Marland Kitchen Psychiatric Judgement and insight Intact.. No evidence of depression, anxiety, or agitation.. Notes no sharp debridement was done today and the wound continues to have slough at the base. It does not probe down to bone. Electronic Signature(s) Signed: 05/12/2016 1:58:57 PM By: Christin Fudge MD, FACS Entered By: Christin Fudge on 05/12/2016 13:58:56 [name] ([id number]) -------------------------------------------------------------------------------- Physician Orders Details Patient Name: [name]. Date of Service: 05/12/2016 1:30 PM Medical Record Number: [id number] Patient Account Number: [id number] Date of Birth/Sex: 12/13/1940 (76 y.o. Male) Treating RN: Baruch Gouty, RN, BSN, Velva Harman Primary Care Provider: Enid Derry Other Clinician: Referring Provider: Enid Derry Treating Provider/Extender: Frann Rider in Treatment: 9 Verbal / Phone Orders: No Diagnosis Coding Wound Cleansing Wound #1 Right,Lateral Malleolus o Clean wound with Normal Saline. o Cleanse wound with mild soap and water Anesthetic Wound #1 Right,Lateral  Malleolus o Topical Lidocaine 4% cream applied to wound bed prior to debridement Skin Barriers/Peri-Wound Care Wound #1 Right,Lateral Malleolus o Antifungal cream - LOTRISONSE.Marland Kitchenapply around wound on reddened areas. Primary Wound Dressing Wound #1 Right,Lateral Malleolus o Santyl Ointment - in clinic o Iodosorb Ointment - at home Secondary Dressing Wound #1 Right,Lateral Malleolus o Dry Gauze o Boardered Foam Dressing Dressing Change Frequency Wound #1 Right,Lateral Malleolus o Change dressing every day. Follow-up Appointments Wound #1 Right,Lateral Malleolus o Return Appointment in 1 week. Edema Control Wound #1 Right,Lateral Malleolus o Elevate legs to the level of the heart and pump ankles as often as possible SHANT, HENCE ([id number]) Off-Loading Wound #1 Right,Lateral Malleolus o Other: - Keep pressure off the malleolus Additional Orders / Instructions Wound #1 Right,Lateral Malleolus o Increase protein intake. o Activity as tolerated Medications-please add to medication list. Wound #1 Right,Lateral Malleolus o Other: - Include these in your diet...VITAMIN A, C, ZINC, MVI Electronic Signature(s) Signed: 05/12/2016 4:07:16 PM By: Christin Fudge MD, FACS Signed: 05/12/2016 4:21:12 PM By: Regan Lemming BSN, RN Entered By: Regan Lemming on 05/12/2016 13:47:08 [name] ([id number]) -------------------------------------------------------------------------------- Problem List Details Patient Name: MARQUELL, SAENZ Date of Service: 05/12/2016 1:30 PM Medical Record Number: [id number] Patient Account Number: [id number] Date of Birth/Sex: 1940/04/05 (76 y.o. Male) Treating RN: Baruch Gouty, RN, BSN, Velva Harman Primary Care Provider: Enid Derry Other Clinician: Referring Provider: Enid Derry Treating Provider/Extender: Frann Rider in Treatment: 9 Active Problems ICD-10 Encounter Code Description Active Date Diagnosis E11.621 Type 2 diabetes  mellitus with foot ulcer 05/05/2016 Yes L89.512 Pressure ulcer of right ankle, stage 2 05/05/2016 Yes L89.521 Pressure ulcer of left ankle, stage 1 05/05/2016 Yes 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 05/05/2016 Yes Inactive Problems Resolved Problems Electronic Signature(s) Signed: 05/12/2016 1:57:11 PM By: Christin Fudge MD, FACS Entered By: Christin Fudge on 05/12/2016 13:57:11 [name] ([id number]) -------------------------------------------------------------------------------- Progress Note Details Patient Name: [name] Date of Service: 05/12/2016 1:30 PM Medical Record Number: [id number] Patient Account Number: [id number] Date of Birth/Sex: [birth year] (76 y.o. Male) Treating RN: Baruch Gouty, RN, BSN, Velva Harman Primary Care Provider: Enid Derry Other Clinician: Referring Provider: Enid Derry Treating Provider/Extender: Frann Rider in Treatment: 9 Subjective Chief Complaint Information obtained from Patient Patient is at the clinic for treatment  of an open pressure ulcer to the right lateral ankle which she's had for about 2 months History of Present Illness (HPI) The following HPI elements were documented for the patient's wound: Location: Patient presents with an ulcer on the right lateral ankle. Quality: Patient reports experiencing a dull pain to affected area(s). Severity: Patient states wound are getting worse. Duration: Patient has had the wound for > 2 months prior to seeking treatment at the wound center Timing: Pain in wound is Intermittent (comes and goes Context: The wound appeared gradually over time Modifying Factors: Other treatment(s) tried include:local care as per the medical oncologist Associated Signs and Symptoms: Patient reports having:some pain and no discharge 76 year old patient has been referred by his medical oncologist Dr. Grayland Ormond, for a right lateral malleolus ulcer has been there for several weeks. He is known to have a stage IV  adenocarcinoma of the lower third esophagus with liver metastasis. He is currently on chemotherapy with FOLFOX and Herceptin. past medical history significant for diabetes mellitus, anemia, hypertension, sleep apnea, status post appendectomy, peripheral vascular catheterization( Port placement) in September 2017 by Dr. Delana Meyer. He is also receiving palliative radiation therapy and was seen by Dr. Donella Stade. 03/17/2016 -- - x-ray of the right ankle -- IMPRESSION: 1. Soft tissue wound noted over the lateral malleolus, no underlying bony abnormality. No acute bony abnormality. 2. Diffuse degenerative change. 3. Peripheral vascular disease. 05/05/2016 -- the patient's wife tells me that she has noticed a small superficial bruise on the left lateral ankle and wanted me to view this area. 05/12/2016 -- the patient is unable to afford Santyl ointment and we have been struggling over the last 8 weeks to use various alternatives. I have asked them today if they would be willing to use the snap vacuum system and she will let me know soon. Allergies sufa drugs, Cephalosporins, lisinopril, losartin, Keflex, amoxicillin, ampicillin, Septra, Bactrim SHENOUDA, GENOVA ([id number]) Objective Constitutional Pulse regular. Respirations normal and unlabored. Afebrile. Vitals Time Taken: 1:37 PM, Height: 67 in, Weight: 136 lbs, BMI: 21.3, Temperature: 98.2 F, Pulse: 70 bpm, Respiratory Rate: 16 breaths/min, Blood Pressure: 136/54 mmHg. Eyes Nonicteric. Reactive to light. Ears, Nose, Mouth, and Throat Lips, teeth, and gums WNL.Marland Kitchen Moist mucosa without lesions. Neck supple and nontender. No palpable supraclavicular or cervical adenopathy. Normal sized without goiter. Respiratory WNL. No retractions.. Breath sounds WNL, No rubs, rales, rhonchi, or wheeze.. Cardiovascular Heart rhythm and rate regular, no murmur or gallop.. Pedal Pulses WNL. No clubbing, cyanosis or edema. Chest Breasts symmetical and no nipple  discharge.. Breast tissue WNL, no masses, lumps, or tenderness.. Lymphatic No adneopathy. No adenopathy. No adenopathy. Musculoskeletal Adexa without tenderness or enlargement.. Digits and nails w/o clubbing, cyanosis, infection, petechiae, ischemia, or inflammatory conditions.Marland Kitchen Psychiatric Judgement and insight Intact.. No evidence of depression, anxiety, or agitation.. General Notes: no sharp debridement was done today and the wound continues to have slough at the base. It does not probe down to bone. Integumentary (Hair, Skin) No suspicious lesions. No crepitus or fluctuance. No peri-wound warmth or erythema. No masses.. Wound #1 status is Open. Original cause of wound was Gradually Appeared. The wound is located on the Right,Lateral Malleolus. The wound measures 1cm length x 1cm width x 0.4cm depth; 0.785cm^2 area and 0.314cm^3 volume. There is Fat Layer (Subcutaneous Tissue) Exposed exposed. There is no tunneling or LIO, WEHRLY ([id number]) undermining noted. There is a medium amount of serous drainage noted. The wound margin is flat and intact. There is no granulation within  the wound bed. There is a large (67-100%) amount of necrotic tissue within the wound bed including Adherent Slough. The periwound skin appearance exhibited: Induration, Maceration, Erythema. The periwound skin appearance did not exhibit: Dry/Scaly. The surrounding wound skin color is noted with erythema which is circumferential. Periwound temperature was noted as No Abnormality. The periwound has tenderness on palpation. Assessment Active Problems ICD-10 E11.621 - Type 2 diabetes mellitus with foot ulcer L89.512 - Pressure ulcer of right ankle, stage 2 L89.521 - Pressure ulcer of left ankle, stage 1 E44.1 - Mild protein-calorie malnutrition Plan Wound Cleansing: Wound #1 Right,Lateral Malleolus: Clean wound with Normal Saline. Cleanse wound with mild soap and water Anesthetic: Wound #1 Right,Lateral  Malleolus: Topical Lidocaine 4% cream applied to wound bed prior to debridement Skin Barriers/Peri-Wound Care: Wound #1 Right,Lateral Malleolus: Antifungal cream - LOTRISONSE.Marland Kitchenapply around wound on reddened areas. Primary Wound Dressing: Wound #1 Right,Lateral Malleolus: Santyl Ointment - in clinic Iodosorb Ointment - at home Secondary Dressing: Wound #1 Right,Lateral Malleolus: Dry Gauze Boardered Foam Dressing Dressing Change Frequency: Wound #1 Right,Lateral Malleolus: Change dressing every day. Follow-up Appointments: Wound #1 Right,Lateral Malleolus: HALEEM, HANNER ([id number]) Return Appointment in 1 week. Edema Control: Wound #1 Right,Lateral Malleolus: Elevate legs to the level of the heart and pump ankles as often as possible Off-Loading: Wound #1 Right,Lateral Malleolus: Other: - Keep pressure off the malleolus Additional Orders / Instructions: Wound #1 Right,Lateral Malleolus: Increase protein intake. Activity as tolerated Medications-please add to medication list.: Wound #1 Right,Lateral Malleolus: Other: - Include these in your diet...VITAMIN A, C, ZINC, MVI the patient will be a good candidate for the snap vacuum system and I have discussed this with the patient's wife who is the main caregiver. The meanwhile, I have recommended: 1. Daily dressing with Iodosorb to be used. we will use Santyl while he is here 2. generic Lotrisone to be applied around the wound 3. Offloading has been discussed as much as possible in great detail -- specially regarding the area on the left lateral ankle 4. Adequate protein, vitamin A, vitamin C and zinc. 5. regular visits to the wound center the patient's wife has had all questions answered and will be compliant Electronic Signature(s) Signed: 05/12/2016 2:00:01 PM By: Christin Fudge MD, FACS Entered By: Christin Fudge on 05/12/2016 14:00:00 [name]  ([id number]) -------------------------------------------------------------------------------- SuperBill Details Patient Name: [name] Date of Service: 05/12/2016 Medical Record Number: [id number] Patient Account Number: [id number] Date of Birth/Sex: [birth year] (76 y.o. Male) Treating RN: Baruch Gouty, RN, BSN, Velva Harman Primary Care Provider: Enid Derry Other Clinician: Referring Provider: Enid Derry Treating Provider/Extender: Frann Rider in Treatment: 9 Diagnosis Coding ICD-10 Codes Code Description E11.621 Type 2 diabetes mellitus with foot ulcer L89.512 Pressure ulcer of right ankle, stage 2 L89.521 Pressure ulcer of left ankle, stage 1 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 Facility Procedures CPT4 Code: [id number] Description: [phone] - WOUND CARE VISIT-LEV 2 EST PT Modifier: Quantity: 1 Physician Procedures CPT4 Code: [id number] Description: 90240 - WC PHYS LEVEL 3 - EST PT ICD-10 Description Diagnosis E11.621 Type 2 diabetes mellitus with foot ulcer L89.512 Pressure ulcer of right ankle, stage 2 Modifier: Quantity: 1 Electronic Signature(s) Signed: 05/12/2016 2:00:45 PM By: Christin Fudge MD, FACS Previous Signature: 05/12/2016 2:00:13 PM Version By: Christin Fudge MD, FACS Entered By: Christin Fudge on 05/12/2016 14:00:45

## 2016-05-13 NOTE — Progress Notes (Signed)
Jeremiah, Jensen ([id number]) Visit Report for 05/12/2016 Allergy List Details Patient Name: [name], [name]. Date of Service: 05/12/2016 1:30 PM Medical Record Number: [id number] Patient Account Number: [id number] Date of Birth/Sex: 11-Jan-1941 (76 y.o. Male) Treating RN: Baruch Gouty, RN, BSN, Velva Harman Primary Care Jenesys Casseus: [place] Derry Other Clinician: Referring Eduin Friedel: [place] D[place] Treating Quincie Haroon/Extender: Frann Rider in Treatment: 9 Allergies Active Allergies sufa drugs Cephalosporins lisinopril losartin Keflex amoxicillin ampicillin Septra Bactrim Allergy Notes Electronic Signature(s) Signed: 05/12/2016 4:21:12 PM By: Regan Lemming BSN, RN Entered By: Regan Lemming on 05/12/2016 13:38:27 [name] ([id number]) -------------------------------------------------------------------------------- [place] Details Patient Name: [name] Date of Service: 05/12/2016 1:30 PM Medical Record Number: [id number] Patient Account Number: [id number] Date of Birth/Sex: 08-22-1940 (76 y.o. Male) Treating RN: Baruch Gouty, RN, BSN, [place] Primary Care Taegen Lennox: En[place] Derry Other Clinician: Referring Uday Jantz: En[place] Derry Treating Asuzena Weis/Extender: Frann Rider in Treatment: 9 Visit Information Patient Arrived: Ambulatory Arrival Time: 13:35 Accompanied By: Hansel Feinstein Transfer Assistance: None Patient Identification Verified: Yes Secondary Verification Process Yes Completed: Patient Requires Transmission- No Based Precautions: Patient Has Alerts: Yes Patient Alerts: Patient on Blood Thinner DM II 81mg  aspirin twice daily History Since Last Visit All ordered tests and consults were completed: No Added or deleted any medications: No Any new allergies or adverse reactions: No Had a fall or experienced change in activities of daily living that may affect risk of falls: No Signs or symptoms of abuse/neglect since last visito No Hospitalized since last visit:  No Has Dressing in Place as Prescribed: Yes Electronic Signature(s) Signed: 05/12/2016 4:21:12 PM By: Regan Lemming BSN, RN Entered By: Regan Lemming on 05/12/2016 13:36:25 [name] ([id number]) -------------------------------------------------------------------------------- Clinic Level of Care Assessment Details Patient Name: [name] Date of Service: 05/12/2016 1:30 PM Medical Record Number: [id number] Patient Account Number: [id number] Date of Birth/Sex: 1940-09-24 (76 y.o. Male) Treating RN: Baruch Gouty, RN, BSN, [place] Primary Care Wave Calzada: Enid Derry Other Clinician: Referring Nyree Applegate: [place] Derry Treating Ltanya Bayley/Extender: Frann Rider in Treatment: 9 Clinic Level of Care Assessment Items TOOL 4 Quantity Score []  - Use when only an EandM is performed on FOLLOW-UP visit 0 ASSESSMENTS - Nursing Assessment / Reassessment X - Reassessment of Co-morbidities (includes updates in patient status) 1 10 X - Reassessment of Adherence to Treatment Plan 1 5 ASSESSMENTS - Wound and Skin Assessment / Reassessment X - Simple Wound Assessment / Reassessment - one wound 1 5 []  - Complex Wound Assessment / Reassessment - multiple wounds 0 []  - Dermatologic / Skin Assessment (not related to wound area) 0 ASSESSMENTS - Focused Assessment []  - Circumferential Edema Measurements - multi extremities 0 []  - Nutritional Assessment / Counseling / Intervention 0 X - Lower Extremity Assessment (monofilament, tuning fork, pulses) 1 5 []  - Peripheral Arterial Disease Assessment (using hand held doppler) 0 ASSESSMENTS - Ostomy and/or Continence Assessment and Care []  - Incontinence Assessment and Management 0 []  - Ostomy Care Assessment and Management (repouching, etc.) 0 PROCESS - Coordination of Care X - Simple Patient / Family Education for ongoing care 1 15 []  - Complex (extensive) Patient / Family Education for ongoing care 0 []  - Staff obtains Programmer, systems, Records, Test Results /  Process Orders 0 []  - Staff telephones HHA, Nursing Homes / Clarify orders / etc 0 []  - Routine Transfer to another Facility (non-emergent condition) 0 JOSEDEJESUS, MARCUM ([id number]) []  - Routine Hospital Admission (non-emergent condition) 0 []  - New Admissions / Insurance Authorizations / Ordering NPWT, Apligraf, etc. 0 []  - Emergency  Hospital Admission (emergent condition) 0 []  - Simple Discharge Coordination 0 []  - Complex (extensive) Discharge Coordination 0 PROCESS - Special Needs []  - Pediatric / Minor Patient Management 0 []  - Isolation Patient Management 0 []  - Hearing / Language / Visual special needs 0 []  - Assessment of Community assistance (transportation, D/C planning, etc.) 0 []  - Additional assistance / Altered mentation 0 []  - Support Surface(s) Assessment (bed, cushion, seat, etc.) 0 INTERVENTIONS - Wound Cleansing / Measurement X - Simple Wound Cleansing - one wound 1 5 []  - Complex Wound Cleansing - multiple wounds 0 X - Wound Imaging (photographs - any number of wounds) 1 5 []  - Wound Tracing (instead of photographs) 0 X - Simple Wound Measurement - one wound 1 5 []  - Complex Wound Measurement - multiple wounds 0 INTERVENTIONS - Wound Dressings X - Small Wound Dressing one or multiple wounds 1 10 []  - Medium Wound Dressing one or multiple wounds 0 []  - Large Wound Dressing one or multiple wounds 0 []  - Application of Medications - topical 0 []  - Application of Medications - injection 0 INTERVENTIONS - Miscellaneous []  - External ear exam 0 NATHYN, LUIZ ([id number]) []  - Specimen Collection (cultures, biopsies, blood, body fluids, etc.) 0 []  - Specimen(s) / Culture(s) sent or taken to Lab for analysis 0 []  - Patient Transfer (multiple staff / Harrel Lemon Lift / Similar devices) 0 []  - Simple Staple / Suture removal (25 or less) 0 []  - Complex Staple / Suture removal (26 or more) 0 []  - Hypo / Hyperglycemic Management (close monitor of Blood Glucose) 0 []  - Ankle /  Brachial Index (ABI) - do not check if billed separately 0 X - Vital Signs 1 5 Has the patient been seen at the hospital within the last three years: Yes Total Score: 70 Level Of Care: New/Established - Level 2 Electronic Signature(s) Signed: 05/12/2016 4:21:12 PM By: Regan Lemming BSN, RN Entered By: Regan Lemming on 05/12/2016 13:48:14 [name] ([id number]) -------------------------------------------------------------------------------- Encounter Discharge Information Details Patient Name: [name] Date of Service: 05/12/2016 1:30 PM Medical Record Number: [id number] Patient Account Number: [id number] Date of Birth/Sex: Mar 18, 1940 (76 y.o. Male) Treating RN: Baruch Gouty, RN, BSN, [place] Harman Primary Care Linkon Siverson: [place] Derry Other Clinician: Referring Hanadi Stanly: [place] D[place] Treating Herold Salguero/Extender: Frann Rider in Treatment: 9 Encounter Discharge Information Items Discharge Pain Level: 0 Discharge Condition: Stable Ambulatory Status: Ambulatory Discharge Destination: Home Transportation: Private Auto Accompanied By: wife Schedule Follow-up Appointment: No Medication Reconciliation completed and provided to Patient/Care No Tamarius Rosenfield: Provided on Clinical Summary of Care: 05/12/2016 Form Type Recipient Paper Patient FW Electronic Signature(s) Signed: 05/12/2016 4:21:12 PM By: Regan Lemming BSN, RN Previous Signature: 05/12/2016 1:56:40 PM Version By: Ruthine Dose Entered By: Regan Lemming on 05/12/2016 13:57:49 [name] ([id number]) -------------------------------------------------------------------------------- General Visit Notes Details Patient Name: [name] Date of Service: 05/12/2016 1:30 PM Medical Record Number: [id number] Patient Account Number: [id number] Date of Birth/Sex: January 29, 1941 (76 y.o. Male) Treating RN: Baruch Gouty, RN, BSN, Velva Harman Primary Care Megha Agnes: [place] Derry Other Clinician: Referring Heavan Francom: [place] Derry Treating  Danity Schmelzer/Extender: Frann Rider in Treatment: 9 Notes Discussed SNAP with patient and wife by MD. Wife will call back during the week if they will want to try this suggested therapy. Electronic Signature(s) Signed: 05/12/2016 2:15:05 PM By: Regan Lemming BSN, RN Entered By: Regan Lemming on 05/12/2016 14:15:05 [name] ([id number]) -------------------------------------------------------------------------------- Lower Extremity Assessment Details Patient Name: ASHVIN, ADELSON Date of Service: 05/12/2016 1:30 PM Medical Record  Number: [id number] Patient Account Number: [id number] Date of Birth/Sex: [birth year] (76 y.o. Male) Treating RN: Baruch Gouty, RN, BSN, Velva Harman Primary Care Curtiss Mahmood: [place] Derry Other Clinician: Referring Angles Trevizo: [place] D[place] Treating Kyle Luppino/Extender: Frann Rider in Treatment: 9 Edema Assessment Assessed: Shirlyn Goltz: No] [Right: No] Edema: [Left: N] [Right: o] Vascular Assessment Claudication: Claudication Assessment [Right:None] Pulses: Dorsalis Pedis Palpable: [Right:Yes] Posterior Tibial Extremity colors, hair growth, and conditions: Extremity Color: [Right:Normal] Hair Growth on Extremity: [Right:Yes] Temperature of Extremity: [Right:Warm] Capillary Refill: [Right:< 3 seconds] Toe Nail Assessment Left: Right: Thick: Yes Discolored: Yes Deformed: No Improper Length and Hygiene: Yes Electronic Signature(s) Signed: 05/12/2016 4:21:12 PM By: Regan Lemming BSN, RN Entered By: Regan Lemming on 05/12/2016 13:40:50 [name] ([id number]) -------------------------------------------------------------------------------- Multi Wound Chart Details Patient Name: [name] Date of Service: 05/12/2016 1:30 PM Medical Record Number: [id number] Patient Account Number: [id number] Date of Birth/Sex: July 15, 1940 (76 y.o. Male) Treating RN: Baruch Gouty, RN, BSN, Velva Harman Primary Care Kylie Gros: En[place] Derry Other Clinician: Referring Madeliene Tejera: [place] Derry Treating Casmer Yepiz/Extender: Frann Rider in Treatment: 9 Vital Signs Height(in): 67 Pulse(bpm): 70 Weight(lbs): 136 Blood Pressure 136/54 (mmHg): Body Mass Index(BMI): 21 Temperature(F): 98.2 Respiratory Rate 16 (breaths/min): Photos: [1:No Photos] [N/A:N/A] Wound Location: [1:Right, Lateral Malleolus] [N/A:N/A] Wounding Event: [1:Gradually Appeared] [N/A:N/A] Primary Etiology: [1:Pressure Ulcer] [N/A:N/A] Secondary Etiology: [1:Diabetic Wound/Ulcer of the Lower Extremity] [N/A:N/A] Comorbid History: [1:Cataracts, Chronic sinus problems/congestion, Anemia, Sleep Apnea, Hypertension, Myocardial Infarction, Type II Diabetes, Received Chemotherapy, Received Radiation] [N/A:N/A] Date Acquired: [1:01/09/2016] [N/A:N/A] Weeks of Treatment: [1:9] [N/A:N/A] Wound Status: [1:Open] [N/A:N/A] Measurements L x W x D 1x1x0.4 [N/A:N/A] (cm) Area (cm) : [1:0.785] [N/A:N/A] Volume (cm) : [1:0.314] [N/A:N/A] % Reduction in Area: [1:-232.60%] [N/A:N/A] % Reduction in Volume: -1208.30% [N/A:N/A] Classification: [1:Category/Stage III] [N/A:N/A] HBO Classification: [1:Grade 1] [N/A:N/A] Exudate Amount: [1:Medium] [N/A:N/A] Exudate Type: [1:Serous] [N/A:N/A] Exudate Color: [1:amber] [N/A:N/A] Wound Margin: [1:Flat and Intact] [N/A:N/A] Granulation Amount: [1:None Present (0%)] [N/A:N/A] Necrotic Amount: Large (67-100%) N/A N/A Exposed Structures: Fat Layer (Subcutaneous N/A N/A Tissue) Exposed: Yes Fascia: No Tendon: No Muscle: No Joint: No Bone: No Epithelialization: None N/A N/A Periwound Skin Texture: Induration: Yes N/A N/A Periwound Skin Maceration: Yes N/A N/A Moisture: Dry/Scaly: No Periwound Skin Color: Erythema: Yes N/A N/A Erythema Location: Circumferential N/A N/A Erythema Change: Decreased N/A N/A Temperature: No Abnormality N/A N/A Tenderness on Yes N/A N/A Palpation: Wound Preparation: Ulcer Cleansing: N/A N/A Rinsed/Irrigated  with Saline Topical Anesthetic Applied: Other: lidociane 4% Treatment Notes Electronic Signature(s) Signed: 05/12/2016 1:57:16 PM By: Christin Fudge MD, FACS Entered By: Christin Fudge on 05/12/2016 13:57:15 [name] ([id number]) -------------------------------------------------------------------------------- [place] Details Patient Name: JOAH, PATLAN Date of Service: 05/12/2016 1:30 PM Medical Record Number: [id number] Patient Account Number: [id number] Date of Birth/Sex: 1940/09/24 (76 y.o. Male) Treating RN: Baruch Gouty, RN, BSN, Velva Harman Primary Care Myca Perno: [place] Derry Other Clinician: Referring Daiveon Markman: [place] Derry Treating Kaoru Rezendes/Extender: Frann Rider in Treatment: 9 Active Inactive ` Orientation to the Wound Care Program Nursing Diagnoses: Knowledge deficit related to the wound healing center program Goals: Patient/caregiver will verbalize understanding of the [facility] [place] Program Date Initiated: 03/10/2016 Target Resolution Date: 03/23/2016 Goal Status: Active Interventions: Provide education on orientation to the wound center Notes: ` Soft Tissue Infection Nursing Diagnoses: Impaired tissue integrity Potential for infection: soft tissue Goals: Patient will remain free of wound infection Date Initiated: 03/10/2016 Target Resolution Date: 03/17/2016 Goal Status: Active Interventions: Assess signs and symptoms of infection every visit Notes: ` Wound/Skin Impairment Nursing Diagnoses: Impaired tissue integrity  GAGAN, DILLION ([id number]) Goals: Ulcer/skin breakdown will heal within 14 weeks Date Initiated: 03/10/2016 Target Resolution Date: 03/24/2016 Goal Status: Active Interventions: Assess patient/caregiver ability to obtain necessary supplies Notes: Electronic Signature(s) Signed: 05/12/2016 4:21:12 PM By: Regan Lemming BSN, RN Entered By: Regan Lemming on 05/12/2016 13:42:18 [name]  ([id number]) -------------------------------------------------------------------------------- Pain Assessment Details Patient Name: [name] Date of Service: 05/12/2016 1:30 PM Medical Record Number: [id number] Patient Account Number: [id number] Date of Birth/Sex: 05/23/1940 (76 y.o. Male) Treating RN: Baruch Gouty, RN, BSN, Velva Harman Primary Care Khamille Beynon: [place] Derry Other Clinician: Referring Endiya Klahr: [place] D[place] Treating Ariella Voit/Extender: Frann Rider in Treatment: 9 Active Problems Location of Pain Severity and Description of Pain Patient Has Paino No Site Locations With Dressing Change: No Pain Management and Medication Current Pain Management: Electronic Signature(s) Signed: 05/12/2016 4:21:12 PM By: Regan Lemming BSN, RN Entered By: Regan Lemming on 05/12/2016 13:36:30 [name] ([id number]) -------------------------------------------------------------------------------- Patient/Caregiver Education Details Patient Name: [name] Date of Service: 05/12/2016 1:30 PM Medical Record Number: [id number] Patient Account Number: [id number] Date of Birth/Gender: 08/14/1940 (76 y.o. Male) Treating RN: Baruch Gouty, RN, BSN, Velva Harman Primary Care Physician: Enid Derry Other Clinician: Referring Physician: En[place] Derry Treating Physician/Extender: Frann Rider in Treatment: 9 Education Assessment Education Provided To: Patient Education Topics Provided Welcome To [facility]: Methods: Explain/Verbal Responses: State content correctly Wound/Skin Impairment: Methods: Explain/Verbal Responses: State content correctly Electronic Signature(s) Signed: 05/12/2016 4:21:12 PM By: Regan Lemming BSN, RN Entered By: Regan Lemming on 05/12/2016 13:58:01 [name] ([id number]) -------------------------------------------------------------------------------- Wound Assessment Details Patient Name: [name] Date of Service: 05/12/2016 1:30 PM Medical  Record Number: [id number] Patient Account Number: [id number] Date of Birth/Sex: Sep 01, 1940 (76 y.o. Male) Treating RN: Baruch Gouty, RN, BSN, [place] Primary Care Stancil Deisher: En[place] Derry Other Clinician: Referring Arbor Cohen: [place] Derry Treating Jameson Morrow/Extender: Frann Rider in Treatment: 9 Wound Status Wound Number: 1 Primary Pressure Ulcer Etiology: Wound Location: Right, Lateral Malleolus Secondary Diabetic Wound/Ulcer of the Lower Wounding Event: Gradually Appeared Etiology: Extremity Date Acquired: 01/09/2016 Wound Open Weeks Of Treatment: 9 Status: Clustered Wound: No Comorbid Cataracts, Chronic sinus History: problems/congestion, Anemia, Sleep Apnea, Hypertension, Myocardial Infarction, Type II Diabetes, Received Chemotherapy, Received Radiation Photos Photo Uploaded By: Regan Lemming on 05/12/2016 16:20:19 Wound Measurements Length: (cm) 1 Width: (cm) 1 Depth: (cm) 0.4 Area: (cm) 0.785 Volume: (cm) 0.314 % Reduction in Area: -232.6% % Reduction in Volume: -1208.3% Epithelialization: None Tunneling: No Undermining: No Wound Description 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 Severity (Wagner): Grade 1 Slough/Fibrin Wound Margin: Flat and Intact Exudate Amount: Medium Exudate Type: Serous Exudate Color: amber ATZEL, MCCAMBRIDGE ([id number]) er Cleansing: No o Yes Wound Bed Granulation Amount: None Present (0%) Exposed Structure Necrotic Amount: Large (67-100%) Fascia Exposed: No Necrotic Quality: Adherent Slough Fat Layer (Subcutaneous Tissue) Exposed: Yes Tendon Exposed: No Muscle Exposed: No Joint Exposed: No Bone Exposed: No Periwound Skin Texture Texture Color No Abnormalities Noted: No No Abnormalities Noted: No Induration: Yes Erythema: Yes Erythema Location: Circumferential Moisture Erythema Change: Decreased No Abnormalities Noted: No Dry / Scaly: No Temperature / Pain Maceration: Yes Temperature: No  Abnormality Tenderness on Palpation: Yes Wound Preparation Ulcer Cleansing: Rinsed/Irrigated with Saline Topical Anesthetic Applied: Other: lidociane 4%, Treatment Notes Wound #1 (Right, Lateral Malleolus) 1. Cleansed with: Clean wound with Normal Saline 4. Dressing Applied: Santyl Ointment 5. Secondary Dressing Applied Bordered Foam Dressing Dry Gauze Electronic Signature(s) Signed: 05/12/2016 4:21:12 PM By: Regan Lemming BSN, RN Entered By: Regan Lemming on 05/12/2016 13:48:30  TIRSO, LAWS ([id number]) -------------------------------------------------------------------------------- [place] Details Patient Name: DOSSIE, SWOR. Date of Service: 05/12/2016 1:30 PM Medical Record Number: [id number] Patient Account Number: [id number] Date of Birth/Sex: September 28, 1940 (76 y.o. Male) Treating RN: Baruch Gouty, RN, BSN, [place] Primary Care Tyrin Herbers: [place] D[place] Other Clinician: Referring Patt Steinhardt: [place] Treating Kianna Billet/Extender: Frann Rider in Treatment: 9 Vital Signs Time Taken: 13:37 Temperature (F): 98.2 Height (in): 67 Pulse (bpm): 70 Weight (lbs): 136 Respiratory Rate (breaths/min): 16 Body Mass Index (BMI): 21.3 Blood Pressure (mmHg): 136/54 Reference Range: 80 - 120 mg / dl Electronic Signature(s) Signed: 05/12/2016 4:21:12 PM By: Regan Lemming BSN, RN Entered By: Regan Lemming on 05/12/2016 13:38:18

## 2016-05-14 NOTE — Progress Notes (Signed)
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  INTERVAL HISTORY:  Patient returns to clinic today for further evaluation and consideration of cycle 2, day 8 of Cyramza and Taxol. Taxol only today. He is complaining of worsening neuropathy in his fingertips as occasionally painful. He also has increased cough. He has a good appetite and his weight is stable. He has no other neurologic complaints. He denies any recent fevers or illnesses. He denies any chest pain or shortness of breath. He denies any nausea, vomiting, constipation, or diarrhea. He has no urinary complaints. Patient otherwise feels well and offers no further specific complaints.  REVIEW OF SYSTEMS:   Review of Systems  Constitutional: Negative for fever, malaise/fatigue and weight loss.  HENT: Positive for congestion.   Respiratory: Positive for cough. Negative for shortness of breath.   Cardiovascular: Negative.  Negative for chest pain and leg swelling.  Gastrointestinal: Negative.  Negative for abdominal pain, blood in stool and melena.  Genitourinary: Negative.   Musculoskeletal: Negative.   Neurological: Positive for sensory change. Negative for weakness.  Psychiatric/Behavioral: Negative.  The patient is not nervous/anxious.     As per HPI. Otherwise, a complete review of systems is negative.  PAST MEDICAL HISTORY: Past Medical History:  Diagnosis Date  . Anemia   . Arthritis   . 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 Diabetes mellitus without complication ([place])   . 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 Esophageal cancer ([place])   . GI bleed   . Hearing loss    left ear  . Hypertension   . Hypothyroidism   . Liver mass   . Mild left atrial enlargement 08/12/2015   Very mild; LA 4.2 cm; echo March 2017  . Mitral valvular regurgitation 08/12/2015  . Sleep apnea   . Stroke ([place])   . TIA (transient ischemic attack)   . 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 APPENDECTOMY    . EUS N/A 10/07/2015   Procedure: FULL UPPER ENDOSCOPIC ULTRASOUND (EUS) RADIAL;  Surgeon: Holly Bodily, MD;  Location: [facility] ENDOSCOPY;  Service: Endoscopy;  Laterality: N/A;  . EYE SURGERY    . FRACTURE SURGERY    . NOSE SURGERY    . PERIPHERAL VASCULAR CATHETERIZATION N/A 10/26/2015   Procedure: Glori Luis Cath Insertion;  Surgeon: Katha Cabal, MD;  Location: [facility];  Service: Cardiovascular;  Laterality: N/A;    FAMILY HISTORY Family History  Problem Relation Age of Onset  . Hypertension Mother   . Lung cancer Father   . Hypertension Brother        ADVANCED DIRECTIVES:    HEALTH MAINTENANCE: Social History  Substance Use Topics  . Smoking status: Former Smoker    Packs/day: 1.00    Types: Cigarettes    Quit date: 10/03/1983  . Smokeless tobacco: Never Used  . Alcohol use 0.0 oz/week     Comment: rarely     Colonoscopy:  PAP:  Bone density:  Lipid panel:  Allergies  Allergen Reactions  .  Sulfa Antibiotics Itching and Hives  . Amoxicillin Hives  . Ampicillin Hives  . Keflex [Cephalexin] Itching  . Lisinopril Other (See Comments)  . Losartan Other (See Comments)  . Sulfamethoxazole-Trimethoprim Other (See Comments)  . Cephalosporins Hives    Current Outpatient Prescriptions  Medication Sig Dispense Refill  . aspirin EC 81 MG tablet Take 81 mg by mouth 2 (two) times daily.     Marland Kitchen BIOTIN PO Take by mouth.    . Cholecalciferol (VITAMIN D3) 1000  UNITS CAPS Take 2,000 Units by mouth daily.     . Coenzyme Q10 (CO Q-10) 200 MG CAPS Take 200 mg by mouth daily.     Marland Kitchen glucose blood (FREESTYLE LITE) test strip Use 2 (two) times daily. Free style lite test strips    . isosorbide mononitrate (IMDUR) 60 MG 24 hr tablet Take 1 tablet (60 mg total) by mouth daily. 30 tablet 0  . levothyroxine (SYNTHROID, LEVOTHROID) 100 MCG tablet Take 100 mcg by mouth every morning.    . lidocaine-prilocaine (EMLA) cream Apply 1 application topically as needed. Apply to port 1-2 hours prior to chemotherapy. Cover with plastic wrap. 30 g 2  . Lutein 40 MG CAPS Take 40 mg by mouth daily.     . Magnesium Oxide 250 MG TABS Take 1 tablet (250 mg total) by mouth daily.  0  . metFORMIN (GLUCOPHAGE) 1000 MG tablet Take 2,000 mg by mouth daily.    . metoprolol tartrate (LOPRESSOR) 25 MG tablet Take 12.5 mg by mouth 2 (two) times daily.     . Misc Natural Products (BEE PROPOLIS PO) Take 1 tablet by mouth 2 (two) times daily.     . Multiple Vitamins-Minerals (MULTI FOR HIM 50+ PO) Take 1 tablet by mouth daily.    . ondansetron (ZOFRAN) 8 MG tablet Take 1 tablet (8 mg total) by mouth every 8 (eight) hours as needed for nausea or vomiting. 30 tablet 2  . prochlorperazine (COMPAZINE) 10 MG tablet Take 1 tablet (10 mg total) by mouth every 6 (six) hours as needed for nausea or vomiting. 30 tablet 2  . Red Yeast Rice 600 MG TABS Take 2 tablets by mouth at bedtime.    . sucralfate (CARAFATE) 1 g tablet Take 1 tablet (1 g total) by mouth 3 (three) times daily. Dissolve each tablet in 2-3 tbsp warm water, swish and swallow. 90 tablet 2  . Wound Dressings (MEDIHONEY WOUND/BURN DRESSING) PSTE   0   No current facility-administered medications for this visit.    Facility-Administered Medications Ordered in Other Visits  Medication Dose Route Frequency Provider Last Rate Last Dose  . 0.9 %  sodium chloride infusion   Intravenous Continuous Lloyd Huger, MD         OBJECTIVE: Vitals:   05/15/16 1010  BP: (!) 146/73  Pulse: 73  Resp: 18  Temp: 97.2 F (36.2 C)     Body mass index is 21.22 kg/m.    ECOG FS:1 - Symptomatic but completely ambulatory  General: Well-developed, well-nourished, no acute distress. Eyes: Pink conjunctiva, anicteric sclera. Lungs: Clear to auscultation bilaterally. Heart: Regular rate and rhythm. No rubs, murmurs, or gallops. Abdomen: Soft, nontender, nondistended. No organomegaly noted, normoactive bowel sounds. Musculoskeletal: No edema, cyanosis, or clubbing. Neuro: Alert, answering all questions appropriately. Cranial nerves grossly intact. Skin: No rashes or petechiae noted. 1 cm ulcer on right ankle unchanged. Psych: Normal affect.   LAB RESULTS:  Lab Results  Component Value Date   NA 135  05/15/2016   K 3.8 05/15/2016   CL 98 (L) 05/15/2016   CO2 26 05/15/2016   GLUCOSE 131 (H) 05/15/2016   BUN 11 05/15/2016   CREATININE 0.56 (L) 05/15/2016   CALCIUM 9.4 05/15/2016   PROT 7.1 05/15/2016   ALBUMIN 3.7 05/15/2016   AST 32 05/15/2016   ALT 18 05/15/2016   ALKPHOS 60 05/15/2016   BILITOT 0.3 05/15/2016   GFRNONAA >60 05/15/2016   GFRAA >60 05/15/2016    Lab Results  Component Value Date   WBC 5.6 05/15/2016   NEUTROABS 4.7 05/15/2016   HGB 10.1 (L) 05/15/2016   HCT 29.3 (L) 05/15/2016   MCV 88.6 05/15/2016   PLT 455 (H) 05/15/2016    Lab Results  Component Value Date   CA199 15,504 (H) 05/15/2016   Lab Results  Component Value Date   CEA 472.4 (H) 05/15/2016     STUDIES: No results found.  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  PLAN:    1. 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 Patient's tumor markers are Essentially unchanged. He received cycle 9 of FOLFOX plus Herceptin + Neulasta on  03/27/2016.  Patient will receive Cyramza on days 1 and 15 and Taxol on days 1, 8, and 15. Patient will have day 22 off.  Proceed with cycle 2, day 8 today. Patient missed cycle 1, day 15. Will dose reduce Taxol secondary to peripheral neuropathy. Return to clinic in 1 week for consideration of cycle 2, day 15. 2. Iron deficiency anemia: Consider IV iron in the future. 3. Cardiac disease: Treatment per cardiology. MUGA scan on March 20, 2016 reported an EF of 52.8%, unchanged. 4. Weight loss: Improving, monitor. 5. Dysphagia: XRT has been completed.  Patient denies any issues swallowing. 6. Weakness and fatigue: Improving. 7. Ankle ulcer: Nonhealing, continue treatment per wound care.  Dressing intact. 8. Peripheral neuropathy: Dose reduce Taxol as above.  Patient expressed understanding and was in agreement with this plan. He also understands that He can call clinic at any time with any questions, concerns, or complaints.    Lloyd Huger, MD   05/17/2016 2:06 PM

## 2016-05-15 ENCOUNTER — Inpatient Hospital Stay: Payer: Medicare Other

## 2016-05-15 ENCOUNTER — Inpatient Hospital Stay: Payer: Medicare Other | Attending: Oncology

## 2016-05-15 ENCOUNTER — Inpatient Hospital Stay (HOSPITAL_BASED_OUTPATIENT_CLINIC_OR_DEPARTMENT_OTHER): Payer: Medicare Other | Admitting: Oncology

## 2016-05-15 VITALS — BP 146/73 | HR 73 | Temp 97.2°F | Resp 18 | Wt 135.5 lb

## 2016-05-15 DIAGNOSIS — I10 Essential (primary) hypertension: Secondary | ICD-10-CM | POA: Insufficient documentation

## 2016-05-15 DIAGNOSIS — C155 Malignant neoplasm of lower third of esophagus: Secondary | ICD-10-CM | POA: Diagnosis not present

## 2016-05-15 DIAGNOSIS — C787 Secondary malignant neoplasm of liver and intrahepatic bile duct: Secondary | ICD-10-CM | POA: Insufficient documentation

## 2016-05-15 DIAGNOSIS — G4730 Sleep apnea, unspecified: Secondary | ICD-10-CM | POA: Diagnosis not present

## 2016-05-15 DIAGNOSIS — I517 Cardiomegaly: Secondary | ICD-10-CM | POA: Diagnosis not present

## 2016-05-15 DIAGNOSIS — R1310 Dysphagia, unspecified: Secondary | ICD-10-CM | POA: Diagnosis not present

## 2016-05-15 DIAGNOSIS — M129 Arthropathy, unspecified: Secondary | ICD-10-CM | POA: Insufficient documentation

## 2016-05-15 DIAGNOSIS — Z7984 Long term (current) use of oral hypoglycemic drugs: Secondary | ICD-10-CM | POA: Diagnosis not present

## 2016-05-15 DIAGNOSIS — R05 Cough: Secondary | ICD-10-CM | POA: Insufficient documentation

## 2016-05-15 DIAGNOSIS — Z79899 Other long term (current) drug therapy: Secondary | ICD-10-CM | POA: Insufficient documentation

## 2016-05-15 DIAGNOSIS — Z8673 Personal history of transient ischemic attack (TIA), and cerebral infarction without residual deficits: Secondary | ICD-10-CM | POA: Diagnosis not present

## 2016-05-15 DIAGNOSIS — Z800 Family history of malignant neoplasm of digestive organs: Secondary | ICD-10-CM | POA: Diagnosis not present

## 2016-05-15 DIAGNOSIS — G629 Polyneuropathy, unspecified: Secondary | ICD-10-CM

## 2016-05-15 DIAGNOSIS — R634 Abnormal weight loss: Secondary | ICD-10-CM | POA: Diagnosis not present

## 2016-05-15 DIAGNOSIS — Z7982 Long term (current) use of aspirin: Secondary | ICD-10-CM

## 2016-05-15 DIAGNOSIS — Z87891 Personal history of nicotine dependence: Secondary | ICD-10-CM | POA: Diagnosis not present

## 2016-05-15 DIAGNOSIS — Z5111 Encounter for antineoplastic chemotherapy: Secondary | ICD-10-CM | POA: Insufficient documentation

## 2016-05-15 DIAGNOSIS — E119 Type 2 diabetes mellitus without complications: Secondary | ICD-10-CM

## 2016-05-15 DIAGNOSIS — E039 Hypothyroidism, unspecified: Secondary | ICD-10-CM | POA: Diagnosis not present

## 2016-05-15 DIAGNOSIS — M5134 Other intervertebral disc degeneration, thoracic region: Secondary | ICD-10-CM

## 2016-05-15 DIAGNOSIS — R531 Weakness: Secondary | ICD-10-CM | POA: Diagnosis not present

## 2016-05-15 DIAGNOSIS — D649 Anemia, unspecified: Secondary | ICD-10-CM | POA: Insufficient documentation

## 2016-05-15 DIAGNOSIS — R5383 Other fatigue: Secondary | ICD-10-CM | POA: Diagnosis not present

## 2016-05-15 DIAGNOSIS — Q2546 Tortuous aortic arch: Secondary | ICD-10-CM | POA: Insufficient documentation

## 2016-05-15 LAB — COMPREHENSIVE METABOLIC PANEL
ALT: 18 U/L (ref 17–63)
AST: 32 U/L (ref 15–41)
Albumin: 3.7 g/dL (ref 3.5–5.0)
Alkaline Phosphatase: 60 U/L (ref 38–126)
Anion gap: 11 (ref 5–15)
BUN: 11 mg/dL (ref 6–20)
CHLORIDE: 98 mmol/L — AB (ref 101–111)
CO2: 26 mmol/L (ref 22–32)
Calcium: 9.4 mg/dL (ref 8.9–10.3)
Creatinine, Ser: 0.56 mg/dL — ABNORMAL LOW (ref 0.61–1.24)
GFR calc Af Amer: >60 mL/min (ref >60)
GLUCOSE: 131 mg/dL — AB (ref 65–99)
Potassium: 3.8 mmol/L (ref 3.5–5.1)
Sodium: 135 mmol/L (ref 135–145)
Total Bilirubin: 0.3 mg/dL (ref 0.3–1.2)
Total Protein: 7.1 g/dL (ref 6.5–8.1)

## 2016-05-15 LAB — CBC WITH DIFFERENTIAL/PLATELET
BASOS ABS: 0.1 10*3/uL (ref 0–0.1)
Basophils Relative: 1 %
EOS PCT: 3 %
Eosinophils Absolute: 0.2 10*3/uL (ref 0–0.7)
HEMATOCRIT: 29.3 % — AB (ref 40.0–52.0)
Hemoglobin: 10.1 g/dL — ABNORMAL LOW (ref 13.0–18.0)
LYMPHS PCT: 5 %
Lymphs Abs: 0.3 10*3/uL — ABNORMAL LOW (ref 1.0–3.6)
MCH: 30.5 pg (ref 26.0–34.0)
MCHC: 34.5 g/dL (ref 32.0–36.0)
MCV: 88.6 fL (ref 80.0–100.0)
Monocytes Absolute: 0.5 10*3/uL (ref 0.2–1.0)
Monocytes Relative: 8 %
NEUTROS ABS: 4.7 10*3/uL (ref 1.4–6.5)
Neutrophils Relative %: 83 %
PLATELETS: 455 10*3/uL — AB (ref 150–440)
RBC: 3.3 MIL/uL — AB (ref 4.40–5.90)
RDW: 19.8 % — ABNORMAL HIGH (ref 11.5–14.5)
WBC: 5.6 10*3/uL (ref 3.8–10.6)

## 2016-05-15 MED ORDER — DIPHENHYDRAMINE HCL 50 MG/ML IJ SOLN
25.0000 mg | Freq: Once | INTRAMUSCULAR | Status: AC
  Administered 2016-05-15: 25 mg via INTRAVENOUS
  Filled 2016-05-15: qty 1

## 2016-05-15 MED ORDER — SODIUM CHLORIDE 0.9 % IV SOLN
72.0000 mg/m2 | Freq: Once | INTRAVENOUS | Status: AC
  Administered 2016-05-15: 120 mg via INTRAVENOUS
  Filled 2016-05-15: qty 20

## 2016-05-15 MED ORDER — SODIUM CHLORIDE 0.9 % IV SOLN
Freq: Once | INTRAVENOUS | Status: AC
Start: 2016-05-15 — End: 2016-05-15
  Administered 2016-05-15: 11:00:00 via INTRAVENOUS
  Filled 2016-05-15: qty 1000

## 2016-05-15 MED ORDER — HEPARIN SOD (PORK) LOCK FLUSH 100 UNIT/ML IV SOLN
500.0000 [IU] | Freq: Once | INTRAVENOUS | Status: AC | PRN
  Administered 2016-05-15: 500 [IU]
  Filled 2016-05-15: qty 5

## 2016-05-15 MED ORDER — SODIUM CHLORIDE 0.9% FLUSH
10.0000 mL | INTRAVENOUS | Status: DC | PRN
  Administered 2016-05-15: 10 mL
  Filled 2016-05-15: qty 10

## 2016-05-15 MED ORDER — DEXAMETHASONE SODIUM PHOSPHATE 10 MG/ML IJ SOLN
10.0000 mg | Freq: Once | INTRAMUSCULAR | Status: AC
  Administered 2016-05-15: 10 mg via INTRAVENOUS
  Filled 2016-05-15: qty 1

## 2016-05-15 MED ORDER — FAMOTIDINE IN NACL 20-0.9 MG/50ML-% IV SOLN
20.0000 mg | Freq: Once | INTRAVENOUS | Status: AC
  Administered 2016-05-15: 20 mg via INTRAVENOUS
  Filled 2016-05-15: qty 50

## 2016-05-15 MED ORDER — PACLITAXEL CHEMO INJECTION 300 MG/50ML
80.0000 mg/m2 | Freq: Once | INTRAVENOUS | Status: DC
  Filled 2016-05-15: qty 22

## 2016-05-15 MED ORDER — DEXAMETHASONE SODIUM PHOSPHATE 100 MG/10ML IJ SOLN: 10.0000 mg | Freq: Once | INTRAMUSCULAR | Status: DC

## 2016-05-15 NOTE — Progress Notes (Signed)
Complains of productive cough, neuropathy in fingertips that prevents patient from buttoning clothing, sharp intermittent elbow pain, and swelling in feet (right greater than left).

## 2016-05-15 NOTE — Progress Notes (Signed)
Dose decreased by 10% per MD from 80mg /m2 to 72mg /m2. Dose now is 120mg .

## 2016-05-16 LAB — CANCER ANTIGEN 19-9: CA 19 9: 15504 U/mL — AB (ref 0–35)

## 2016-05-16 LAB — CEA: CEA: 472.4 ng/mL — AB (ref 0.0–4.7)

## 2016-05-19 ENCOUNTER — Encounter: Payer: Medicare Other | Attending: Surgery | Admitting: Surgery

## 2016-05-19 DIAGNOSIS — Z880 Allergy status to penicillin: Secondary | ICD-10-CM | POA: Insufficient documentation

## 2016-05-19 DIAGNOSIS — I10 Essential (primary) hypertension: Secondary | ICD-10-CM | POA: Diagnosis not present

## 2016-05-19 DIAGNOSIS — L89512 Pressure ulcer of right ankle, stage 2: Secondary | ICD-10-CM | POA: Diagnosis not present

## 2016-05-19 DIAGNOSIS — Z882 Allergy status to sulfonamides status: Secondary | ICD-10-CM | POA: Diagnosis not present

## 2016-05-19 DIAGNOSIS — L89521 Pressure ulcer of left ankle, stage 1: Secondary | ICD-10-CM | POA: Diagnosis not present

## 2016-05-19 DIAGNOSIS — D649 Anemia, unspecified: Secondary | ICD-10-CM | POA: Diagnosis not present

## 2016-05-19 DIAGNOSIS — C155 Malignant neoplasm of lower third of esophagus: Secondary | ICD-10-CM | POA: Diagnosis not present

## 2016-05-19 DIAGNOSIS — E11621 Type 2 diabetes mellitus with foot ulcer: Secondary | ICD-10-CM | POA: Insufficient documentation

## 2016-05-19 DIAGNOSIS — L89513 Pressure ulcer of right ankle, stage 3: Secondary | ICD-10-CM | POA: Diagnosis not present

## 2016-05-19 DIAGNOSIS — G4730 Sleep apnea, unspecified: Secondary | ICD-10-CM | POA: Diagnosis not present

## 2016-05-19 DIAGNOSIS — E441 Mild protein-calorie malnutrition: Secondary | ICD-10-CM | POA: Diagnosis not present

## 2016-05-20 NOTE — Progress Notes (Signed)
DILLYN, MENNA ([id number]) Visit Report for 05/19/2016 Chief Complaint Document Details Patient Name: [name], [name]. Date of Service: 05/19/2016 9:15 AM Medical Record Number: [id number] Patient Account Number: [id number] Date of Birth/Sex: 1940-10-18 (76 y.o. Male) Treating RN: Baruch Gouty, RN, BSN, Velva Harman Primary Care Provider: Enid Derry Other Clinician: Referring Provider: Enid Derry Treating Provider/Extender: Frann Rider in Treatment: 10 Information Obtained from: Patient Chief Complaint Patient is at the clinic for treatment of an open pressure ulcer to the right lateral ankle which she's had for about 2 months Electronic Signature(s) Signed: 05/19/2016 9:36:53 AM By: Christin Fudge MD, FACS Entered By: Christin Fudge on 05/19/2016 09:36:53 [name] ([id number]) -------------------------------------------------------------------------------- HPI Details Patient Name: [name] Date of Service: 05/19/2016 9:15 AM Medical Record Number: [id number] Patient Account Number: [id number] Date of Birth/Sex: [birth year] (76 y.o. Male) Treating RN: Baruch Gouty, RN, BSN, Velva Harman Primary Care Provider: Enid Derry Other Clinician: Referring Provider: Enid Derry Treating Provider/Extender: Frann Rider in Treatment: 10 History of Present Illness Location: Patient presents with an ulcer on the right lateral ankle. Quality: Patient reports experiencing a dull pain to affected area(s). Severity: Patient states wound are getting worse. Duration: Patient has had the wound for > 2 months prior to seeking treatment at the wound center Timing: Pain in wound is Intermittent (comes and goes Context: The wound appeared gradually over time Modifying Factors: Other treatment(s) tried include:local care as per the medical oncologist Associated Signs and Symptoms: Patient reports having:some pain and no discharge HPI Description: 76 year old patient has been referred by his medical  oncologist Dr. Grayland Ormond, for a right lateral malleolus ulcer has been there for several weeks. 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 He is currently on chemotherapy with FOLFOX and Herceptin. past medical history significant for diabetes mellitus, anemia, hypertension, sleep apnea, status post appendectomy, peripheral vascular catheterization( Port placement) in September 2017 by Dr. Delana Meyer. He is also receiving palliative radiation therapy and was seen by Dr. Donella Stade. 03/17/2016 -- - x-ray of the right ankle -- IMPRESSION: 1. Soft tissue wound noted over the lateral malleolus, no underlying bony abnormality. No acute bony abnormality. 2. Diffuse degenerative change. 3. Peripheral vascular disease. 05/05/2016 -- the patient's wife tells me that she has noticed a small superficial bruise on the left lateral ankle and wanted me to view this area. 05/12/2016 -- the patient is unable to afford Santyl ointment and we have been struggling over the last 8 weeks to use various alternatives. I have asked them today if they would be willing to use the snap vacuum system and she will let me know soon. 05/19/2016 -- the patient and the wife have declined the use of the snap the vacuum system. He does not want any debridement to be done today. His proteins have improved a bit. Electronic Signature(s) Signed: 05/19/2016 9:37:37 AM By: Christin Fudge MD, FACS Entered By: Christin Fudge on 05/19/2016 09:37:37 [name] ([id number]) -------------------------------------------------------------------------------- Physical Exam Details Patient Name: [name], [name] Date of Service: 05/19/2016 9:15 AM Medical Record Number: [id number] Patient Account Number: [id number] Date of Birth/Sex: 1940/05/02 (76 y.o. Male) Treating RN: Baruch Gouty, RN, BSN, Velva Harman Primary Care Provider: Enid Derry Other Clinician: Referring Provider: Enid Derry Treating  Provider/Extender: Frann Rider in Treatment: 10 Constitutional . Pulse regular. Respirations normal and unlabored. Afebrile. . Eyes Nonicteric. Reactive to light. Ears, Nose, Mouth, and Throat Lips, teeth, and gums WNL.Marland Kitchen Moist mucosa without lesions. Neck supple and  nontender. No palpable supraclavicular or cervical adenopathy. Normal sized without goiter. Respiratory WNL. No retractions.. Breath sounds WNL, No rubs, rales, rhonchi, or wheeze.. Cardiovascular Heart rhythm and rate regular, no murmur or gallop.. Pedal Pulses WNL. No clubbing, cyanosis or edema. Chest Breasts symmetical and no nipple discharge.. Breast tissue WNL, no masses, lumps, or tenderness.. Lymphatic No adneopathy. No adenopathy. No adenopathy. Musculoskeletal Adexa without tenderness or enlargement.. Digits and nails w/o clubbing, cyanosis, infection, petechiae, ischemia, or inflammatory conditions.. Integumentary (Hair, Skin) No suspicious lesions. No crepitus or fluctuance. No peri-wound warmth or erythema. No masses.Marland Kitchen Psychiatric Judgement and insight Intact.. No evidence of depression, anxiety, or agitation.. Notes edges of the wound looks healthy but the base continues to have subcutaneous debris. He has not consented to debridement today Electronic Signature(s) Signed: 05/19/2016 9:38:04 AM By: Christin Fudge MD, FACS Entered By: Christin Fudge on 05/19/2016 09:38:03 [name] ([id number]) -------------------------------------------------------------------------------- Physician Orders Details Patient Name: [name] Date of Service: 05/19/2016 9:15 AM Medical Record Number: [id number] Patient Account Number: [id number] Date of Birth/Sex: 04/22/1940 (76 y.o. Male) Treating RN: Baruch Gouty, RN, BSN, Velva Harman Primary Care Provider: Enid Derry Other Clinician: Referring Provider: Enid Derry Treating Provider/Extender: Frann Rider in Treatment: 10 Verbal / Phone Orders:  No Diagnosis Coding Wound Cleansing Wound #1 Right,Lateral Malleolus o Clean wound with Normal Saline. o Cleanse wound with mild soap and water Anesthetic Wound #1 Right,Lateral Malleolus o Topical Lidocaine 4% cream applied to wound bed prior to debridement Skin Barriers/Peri-Wound Care Wound #1 Right,Lateral Malleolus o Antifungal cream - LOTRISONSE.Marland Kitchenapply around wound on reddened areas. Primary Wound Dressing Wound #1 Right,Lateral Malleolus o Hydrogel - similar to KY jelly over the counter o Cutimed Sorbact - cut small piece and insert in wound Secondary Dressing Wound #1 Right,Lateral Malleolus o Dry Gauze o Boardered Foam Dressing Dressing Change Frequency Wound #1 Right,Lateral Malleolus o Change dressing every day. Follow-up Appointments Wound #1 Right,Lateral Malleolus o Return Appointment in 1 week. Edema Control Wound #1 Right,Lateral Malleolus o Elevate legs to the level of the heart and pump ankles as often as possible [name], [name] ([id number]) Off-Loading Wound #1 Right,Lateral Malleolus o Other: - Keep pressure off the malleolus Additional Orders / Instructions Wound #1 Right,Lateral Malleolus o Increase protein intake. o Activity as tolerated Medications-please add to medication list. Wound #1 Right,Lateral Malleolus o Other: - Include these in your diet...VITAMIN A, C, ZINC, MVI Electronic Signature(s) Signed: 05/19/2016 3:18:42 PM By: Regan Lemming BSN, RN Signed: 05/19/2016 4:19:22 PM By: Christin Fudge MD, FACS Entered By: Regan Lemming on 05/19/2016 09:32:32 [name] ([id number]) -------------------------------------------------------------------------------- Problem List Details Patient Name: YOVANNI, FRENETTE Date of Service: 05/19/2016 9:15 AM Medical Record Number: [id number] Patient Account Number: [id number] Date of Birth/Sex: [birth year] (76 y.o. Male) Treating RN: Baruch Gouty, RN, BSN, Velva Harman Primary Care Provider:  Enid Derry Other Clinician: Referring Provider: Enid Derry Treating Provider/Extender: Frann Rider in Treatment: 10 Active Problems ICD-10 Encounter Code Description Active Date Diagnosis E11.621 Type 2 diabetes mellitus with foot ulcer 05/05/2016 Yes L89.512 Pressure ulcer of right ankle, stage 2 05/05/2016 Yes L89.521 Pressure ulcer of left ankle, stage 1 05/05/2016 Yes 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 05/05/2016 Yes Inactive Problems Resolved Problems Electronic Signature(s) Signed: 05/19/2016 9:36:25 AM By: Christin Fudge MD, FACS Entered By: Christin Fudge on 05/19/2016 09:36:25 [name] ([id number]) -------------------------------------------------------------------------------- Progress Note Details Patient Name: [name] Date of Service: 05/19/2016 9:15 AM Medical Record Number: [id number] Patient Account Number: [id number] Date of Birth/Sex: 11-Oct-1940 (75 y.o.  Male) Treating RN: Afful, RN, BSN, Velva Harman Primary Care Provider: Enid Derry Other Clinician: Referring Provider: Enid Derry Treating Provider/Extender: Frann Rider in Treatment: 10 Subjective Chief Complaint Information obtained from Patient Patient is at the clinic for treatment of an open pressure ulcer to the right lateral ankle which she's had for about 2 months History of Present Illness (HPI) The following HPI elements were documented for the patient's wound: Location: Patient presents with an ulcer on the right lateral ankle. Quality: Patient reports experiencing a dull pain to affected area(s). Severity: Patient states wound are getting worse. Duration: Patient has had the wound for > 2 months prior to seeking treatment at the wound center Timing: Pain in wound is Intermittent (comes and goes Context: The wound appeared gradually over time Modifying Factors: Other treatment(s) tried include:local care as per the medical oncologist Associated Signs and Symptoms:  Patient reports having:some pain and no discharge 76 year old patient has been referred by his medical oncologist Dr. Grayland Ormond, for a right lateral malleolus ulcer has been there for several weeks. 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 He is currently on chemotherapy with FOLFOX and Herceptin. past medical history significant for diabetes mellitus, anemia, hypertension, sleep apnea, status post appendectomy, peripheral vascular catheterization( Port placement) in September 2017 by Dr. Delana Meyer. He is also receiving palliative radiation therapy and was seen by Dr. Donella Stade. 03/17/2016 -- - x-ray of the right ankle -- IMPRESSION: 1. Soft tissue wound noted over the lateral malleolus, no underlying bony abnormality. No acute bony abnormality. 2. Diffuse degenerative change. 3. Peripheral vascular disease. 05/05/2016 -- the patient's wife tells me that she has noticed a small superficial bruise on the left lateral ankle and wanted me to view this area. 05/12/2016 -- the patient is unable to afford Santyl ointment and we have been struggling over the last 8 weeks to use various alternatives. I have asked them today if they would be willing to use the snap vacuum system and she will let me know soon. 05/19/2016 -- the patient and the wife have declined the use of the snap the vacuum system. He does not want any debridement to be done today. His proteins have improved a bit. Allergies [name], [name] ([id number]) sufa drugs, Cephalosporins, lisinopril, losartin, Keflex, amoxicillin, ampicillin, Septra, Bactrim Objective Constitutional Pulse regular. Respirations normal and unlabored. Afebrile. Vitals Time Taken: 9:19 AM, Height: 67 in, Weight: 136 lbs, BMI: 21.3, Temperature: 97.7 F, Pulse: 75 bpm, Respiratory Rate: 16 breaths/min, Blood Pressure: 136/55 mmHg. Eyes Nonicteric. Reactive to light. Ears, Nose, Mouth, and Throat Lips,  teeth, and gums WNL.Marland Kitchen Moist mucosa without lesions. Neck supple and nontender. No palpable supraclavicular or cervical adenopathy. Normal sized without goiter. Respiratory WNL. No retractions.. Breath sounds WNL, No rubs, rales, rhonchi, or wheeze.. Cardiovascular Heart rhythm and rate regular, no murmur or gallop.. Pedal Pulses WNL. No clubbing, cyanosis or edema. Chest Breasts symmetical and no nipple discharge.. Breast tissue WNL, no masses, lumps, or tenderness.. Lymphatic No adneopathy. No adenopathy. No adenopathy. Musculoskeletal Adexa without tenderness or enlargement.. Digits and nails w/o clubbing, cyanosis, infection, petechiae, ischemia, or inflammatory conditions.Marland Kitchen Psychiatric Judgement and insight Intact.. No evidence of depression, anxiety, or agitation.. General Notes: edges of the wound looks healthy but the base continues to have subcutaneous debris. He has not consented to debridement today Integumentary (Hair, Skin) No suspicious lesions. No crepitus or fluctuance. No peri-wound warmth or erythema. No masses.[name], [name] ([id number]) Wound #  1 status is Open. Original cause of wound was Gradually Appeared. The wound is located on the Right,Lateral Malleolus. The wound measures 0.8cm length x 0.9cm width x 0.3cm depth; 0.565cm^2 area and 0.17cm^3 volume. There is Fat Layer (Subcutaneous Tissue) Exposed exposed. There is no tunneling or undermining noted. There is a medium amount of serous drainage noted. The wound margin is flat and intact. There is no granulation within the wound bed. There is a large (67-100%) amount of necrotic tissue within the wound bed including Adherent Slough. The periwound skin appearance exhibited: Induration, Maceration, Erythema. The periwound skin appearance did not exhibit: Dry/Scaly. The surrounding wound skin color is noted with erythema which is circumferential. Periwound temperature was noted as No Abnormality. The periwound has  tenderness on palpation. Assessment Active Problems ICD-10 E11.621 - Type 2 diabetes mellitus with foot ulcer L89.512 - Pressure ulcer of right ankle, stage 2 L89.521 - Pressure ulcer of left ankle, stage 1 E44.1 - Mild protein-calorie malnutrition Plan Wound Cleansing: Wound #1 Right,Lateral Malleolus: Clean wound with Normal Saline. Cleanse wound with mild soap and water Anesthetic: Wound #1 Right,Lateral Malleolus: Topical Lidocaine 4% cream applied to wound bed prior to debridement Skin Barriers/Peri-Wound Care: Wound #1 Right,Lateral Malleolus: Antifungal cream - LOTRISONSE.Marland Kitchenapply around wound on reddened areas. Primary Wound Dressing: Wound #1 Right,Lateral Malleolus: Hydrogel - similar to [place] jelly over the counter Cutimed Sorbact - cut small piece and insert in wound Secondary Dressing: Wound #1 Right,Lateral Malleolus: Dry Gauze Boardered Foam Dressing Dressing Change Frequency: [name], [name] ([id number]) Wound #1 Right,Lateral Malleolus: Change dressing every day. Follow-up Appointments: Wound #1 Right,Lateral Malleolus: Return Appointment in 1 week. Edema Control: Wound #1 Right,Lateral Malleolus: Elevate legs to the level of the heart and pump ankles as often as possible Off-Loading: Wound #1 Right,Lateral Malleolus: Other: - Keep pressure off the malleolus Additional Orders / Instructions: Wound #1 Right,Lateral Malleolus: Increase protein intake. Activity as tolerated Medications-please add to medication list.: Wound #1 Right,Lateral Malleolus: Other: - Include these in your diet...VITAMIN A, C, ZINC, MVI the patient will be a good candidate for the snap vacuum system and I have discussed this with the patient's wife who is the main caregiver. they have thought about it and declined the use of the snap vacuum system. I have recommended: 1. Daily dressing with hydrogel and Sorbact. 2. generic Lotrisone to be applied around the wound 3. Offloading has  been discussed as much as possible in great detail -- specially regarding the area on the left lateral ankle 4. Adequate protein, vitamin A, vitamin C and zinc. 5. regular visits to the wound center. the patient's wife has had all questions answered and will be compliant Electronic Signature(s) Signed: 05/19/2016 2:24:54 PM By: Christin Fudge MD, FACS Previous Signature: 05/19/2016 9:39:52 AM Version By: Christin Fudge MD, FACS Entered By: Christin Fudge on 05/19/2016 14:24:54 [name] ([id number]) -------------------------------------------------------------------------------- SuperBill Details Patient Name: [name] Date of Service: 05/19/2016 Medical Record Number: [id number] Patient Account Number: [id number] Date of Birth/Sex: 1940-11-13 (76 y.o. Male) Treating RN: Baruch Gouty, RN, BSN, Velva Harman Primary Care Provider: Enid Derry Other Clinician: Referring Provider: Enid Derry Treating Provider/Extender: Frann Rider in Treatment: 10 Diagnosis Coding ICD-10 Codes Code Description E11.621 Type 2 diabetes mellitus with foot ulcer L89.512 Pressure ulcer of right ankle, stage 2 L89.521 Pressure ulcer of left ankle, stage 1 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 Facility Procedures CPT4 Code: [id number] Description: 70017 - WOUND CARE VISIT-LEV 2 EST PT Modifier: Quantity: 1 Physician Procedures CPT4 Code: [id number]  Description: 99213 - WC PHYS LEVEL 3 - EST PT ICD-10 Description Diagnosis E11.621 Type 2 diabetes mellitus with foot ulcer L89.512 Pressure ulcer of right ankle, stage 2 L89.521 Pressure ulcer of left ankle, stage 1 E44.1 Mild protein-calorie  malnutrition Modifier: Quantity: 1 Electronic Signature(s) Signed: 05/19/2016 9:40:58 AM By: Christin Fudge MD, FACS Entered By: Christin Fudge on 05/19/2016 09:40:56

## 2016-05-20 NOTE — Progress Notes (Signed)
WINTHROP, SHANNAHAN ([id number]) Visit Report for 05/19/2016 Allergy List Details Patient Name: NORRIN, SHREFFLER. Date of Service: 05/19/2016 9:15 AM Medical Record Number: [id number] Patient Account Number: [id number] Date of Birth/Sex: [birth year] (76 y.o. Male) Treating RN: Baruch Gouty, RN, BSN, Velva Harman Primary Care Coben Godshall: [place] Other Clinician: Referring Noach Calvillo: [place] Treating Terald Jump/Extender: Frann Rider in Treatment: 10 Allergies Active Allergies sufa drugs Cephalosporins lisinopril losartin Keflex amoxicillin ampicillin Septra Bactrim Allergy Notes Electronic Signature(s) Signed: 05/19/2016 3:18:42 PM By: Regan Lemming BSN, RN Entered By: Regan Lemming on 05/19/2016 09:40:40 [name] ([id number]) -------------------------------------------------------------------------------- [place] Details Patient Name: [name] Date of Service: 05/19/2016 9:15 AM Medical Record Number: [id number] Patient Account Number: [id number] Date of Birth/Sex: February 01, 1941 (76 y.o. Male) Treating RN: Baruch Gouty, RN, BSN, Velva Harman Primary Care Hazaiah Edgecombe: [place] Other Clinician: Referring Porfirio Bollier: [place] Treating Sapir Lavey/Extender: Frann Rider in Treatment: 10 Visit Information Patient Arrived: Ambulatory Arrival Time: 09:19 Accompanied By: WIFE Transfer Assistance: None Patient Identification Verified: Yes Secondary Verification Process Yes Completed: Patient Requires Transmission- No Based Precautions: Patient Has Alerts: Yes Patient Alerts: Patient on Blood Thinner DM II 81mg  aspirin twice daily History Since Last Visit All ordered tests and consults were completed: No Added or deleted any medications: No Any new allergies or adverse reactions: No Had a fall or experienced change in activities of daily living that may affect risk of falls: No Signs or symptoms of abuse/neglect since last visito No Has Dressing in Place as Prescribed:  Yes Electronic Signature(s) Signed: 05/19/2016 3:18:42 PM By: Regan Lemming BSN, RN Entered By: Regan Lemming on 05/19/2016 09:19:41 [name] ([id number]) -------------------------------------------------------------------------------- Clinic Level of Care Assessment Details Patient Name: [name] Date of Service: 05/19/2016 9:15 AM Medical Record Number: [id number] Patient Account Number: [id number] Date of Birth/Sex: [birth year] (76 y.o. Male) Treating RN: Baruch Gouty, RN, BSN, [place] Primary Care Alara Daniel: [place] D[place] Other Clinician: Referring Jisela Merlino: [place] Treating Teleshia Lemere/Extender: Frann Rider in Treatment: 10 Clinic Level of Care Assessment Items TOOL 4 Quantity Score []  - Use when only an EandM is performed on FOLLOW-UP visit 0 ASSESSMENTS - Nursing Assessment / Reassessment X - Reassessment of Co-morbidities (includes updates in patient status) 1 10 X - Reassessment of Adherence to Treatment Plan 1 5 ASSESSMENTS - Wound and Skin Assessment / Reassessment X - Simple Wound Assessment / Reassessment - one wound 1 5 []  - Complex Wound Assessment / Reassessment - multiple wounds 0 []  - Dermatologic / Skin Assessment (not related to wound area) 0 ASSESSMENTS - Focused Assessment []  - Circumferential Edema Measurements - multi extremities 0 []  - Nutritional Assessment / Counseling / Intervention 0 X - Lower Extremity Assessment (monofilament, tuning fork, pulses) 1 5 []  - Peripheral Arterial Disease Assessment (using hand held doppler) 0 ASSESSMENTS - Ostomy and/or Continence Assessment and Care []  - Incontinence Assessment and Management 0 []  - Ostomy Care Assessment and Management (repouching, etc.) 0 PROCESS - Coordination of Care X - Simple Patient / Family Education for ongoing care 1 15 []  - Complex (extensive) Patient / Family Education for ongoing care 0 []  - Staff obtains Programmer, systems, Records, Test Results / Process Orders 0 []  - Staff telephones HHA,  Nursing Homes / Clarify orders / etc 0 []  - Routine Transfer to another Facility (non-emergent condition) 0 ROMAINE, NEVILLE ([id number]) []  - Routine Hospital Admission (non-emergent condition) 0 []  - New Admissions / Insurance Authorizations / Ordering NPWT, Apligraf, etc. 0 []  - Emergency Hospital Admission (emergent condition) 0 []  -  Simple Discharge Coordination 0 []  - Complex (extensive) Discharge Coordination 0 PROCESS - Special Needs []  - Pediatric / Minor Patient Management 0 []  - Isolation Patient Management 0 []  - Hearing / Language / Visual special needs 0 []  - Assessment of Community assistance (transportation, D/C planning, etc.) 0 []  - Additional assistance / Altered mentation 0 []  - Support Surface(s) Assessment (bed, cushion, seat, etc.) 0 INTERVENTIONS - Wound Cleansing / Measurement X - Simple Wound Cleansing - one wound 1 5 []  - Complex Wound Cleansing - multiple wounds 0 X - Wound Imaging (photographs - any number of wounds) 1 5 []  - Wound Tracing (instead of photographs) 0 X - Simple Wound Measurement - one wound 1 5 []  - Complex Wound Measurement - multiple wounds 0 INTERVENTIONS - Wound Dressings X - Small Wound Dressing one or multiple wounds 1 10 []  - Medium Wound Dressing one or multiple wounds 0 []  - Large Wound Dressing one or multiple wounds 0 []  - Application of Medications - topical 0 []  - Application of Medications - injection 0 INTERVENTIONS - Miscellaneous []  - External ear exam 0 OSHAE, SIMMERING ([id number]) []  - Specimen Collection (cultures, biopsies, blood, body fluids, etc.) 0 []  - Specimen(s) / Culture(s) sent or taken to Lab for analysis 0 []  - Patient Transfer (multiple staff / Harrel Lemon Lift / Similar devices) 0 []  - Simple Staple / Suture removal (25 or less) 0 []  - Complex Staple / Suture removal (26 or more) 0 []  - Hypo / Hyperglycemic Management (close monitor of Blood Glucose) 0 []  - Ankle / Brachial Index (ABI) - do not check if billed  separately 0 X - Vital Signs 1 5 Has the patient been seen at the hospital within the last three years: Yes Total Score: 70 Level Of Care: New/Established - Level 2 Electronic Signature(s) Signed: 05/19/2016 3:18:42 PM By: Regan Lemming BSN, RN Entered By: Regan Lemming on 05/19/2016 09:33:11 [name] ([id number]) -------------------------------------------------------------------------------- Encounter Discharge Information Details Patient Name: [name] Date of Service: 05/19/2016 9:15 AM Medical Record Number: [id number] Patient Account Number: [id number] Date of Birth/Sex: 14-Dec-1940 (76 y.o. Male) Treating RN: Baruch Gouty, RN, BSN, Velva Harman Primary Care Osiris Charles: [place] Other Clinician: Referring Airam Heidecker: [place] Treating Yasemin Rabon/Extender: Frann Rider in Treatment: 10 Encounter Discharge Information Items Schedule Follow-up Appointment: No Medication Reconciliation completed No and provided to Patient/Care Edee Nifong: Provided on Clinical Summary of Care: 05/19/2016 Form Type Recipient Paper Patient FW Electronic Signature(s) Signed: 05/19/2016 9:42:26 AM By: Ruthine Dose Entered By: Ruthine Dose on 05/19/2016 09:42:24 [name] ([id number]) -------------------------------------------------------------------------------- Lower Extremity Assessment Details Patient Name: [name] Date of Service: 05/19/2016 9:15 AM Medical Record Number: [id number] Patient Account Number: [id number] Date of Birth/Sex: January 07, 1941 (76 y.o. Male) Treating RN: Baruch Gouty, RN, BSN, Velva Harman Primary Care Neela Zecca: [place] Other Clinician: Referring Careli Luzader: [place] Treating Dustin Bumbaugh/Extender: Frann Rider in Treatment: 10 Vascular Assessment Claudication: Claudication Assessment [Right:None] Pulses: Dorsalis Pedis Palpable: [Right:Yes] Posterior Tibial Extremity colors, hair growth, and conditions: Extremity Color: [Right:Normal] Hair Growth on  Extremity: [Right:No] Temperature of Extremity: [Right:Warm] Capillary Refill: [Right:< 3 seconds] Toe Nail Assessment Left: Right: Thick: Yes Discolored: Yes Deformed: No Improper Length and Hygiene: No Electronic Signature(s) Signed: 05/19/2016 3:18:42 PM By: Regan Lemming BSN, RN Entered By: Regan Lemming on 05/19/2016 09:26:43 [name] ([id number]) -------------------------------------------------------------------------------- Multi Wound Chart Details Patient Name: [name] Date of Service: 05/19/2016 9:15 AM Medical Record Number: [id number] Patient Account Number: [id number] Date of Birth/Sex: March 10, 1940 (  76 y.o. Male) Treating RN: Afful, RN, BSN, Velva Harman Primary Care Esaw Knippel: [place] Other Clinician: Referring Modene Andy: [place] Treating Keyasia Jolliff/Extender: Frann Rider in Treatment: 10 Vital Signs Height(in): 67 Pulse(bpm): 75 Weight(lbs): 136 Blood Pressure 136/55 (mmHg): Body Mass Index(BMI): 21 Temperature(F): 97.7 Respiratory Rate 16 (breaths/min): Photos: [1:No Photos] [N/A:N/A] Wound Location: [1:Right Malleolus - Lateral] [N/A:N/A] Wounding Event: [1:Gradually Appeared] [N/A:N/A] Primary Etiology: [1:Pressure Ulcer] [N/A:N/A] Secondary Etiology: [1:Diabetic Wound/Ulcer of the Lower Extremity] [N/A:N/A] Comorbid History: [1:Cataracts, Chronic sinus problems/congestion, Anemia, Sleep Apnea, Hypertension, Myocardial Infarction, Type II Diabetes, Received Chemotherapy, Received Radiation] [N/A:N/A] Date Acquired: [1:01/09/2016] [N/A:N/A] Weeks of Treatment: [1:10] [N/A:N/A] Wound Status: [1:Open] [N/A:N/A] Measurements L x W x D 0.8x0.9x0.3 [N/A:N/A] (cm) Area (cm) : [1:0.565] [N/A:N/A] Volume (cm) : [1:0.17] [N/A:N/A] % Reduction in Area: [1:-139.40%] [N/A:N/A] % Reduction in Volume: -608.30% [N/A:N/A] Classification: [1:Category/Stage III] [N/A:N/A] HBO Classification: [1:Grade 1] [N/A:N/A] Exudate Amount: [1:Medium]  [N/A:N/A] Exudate Type: [1:Serous] [N/A:N/A] Exudate Color: [1:amber] [N/A:N/A] Wound Margin: [1:Flat and Intact] [N/A:N/A] Granulation Amount: [1:None Present (0%)] [N/A:N/A] Necrotic Amount: Large (67-100%) N/A N/A Exposed Structures: Fat Layer (Subcutaneous N/A N/A Tissue) Exposed: Yes Fascia: No Tendon: No Muscle: No Joint: No Bone: No Epithelialization: None N/A N/A Periwound Skin Texture: Induration: Yes N/A N/A Periwound Skin Maceration: Yes N/A N/A Moisture: Dry/Scaly: No Periwound Skin Color: Erythema: Yes N/A N/A Erythema Location: Circumferential N/A N/A Erythema Change: Decreased N/A N/A Temperature: No Abnormality N/A N/A Tenderness on Yes N/A N/A Palpation: Wound Preparation: Ulcer Cleansing: N/A N/A Rinsed/Irrigated with Saline Topical Anesthetic Applied: Other: lidociane 4% Treatment Notes Electronic Signature(s) Signed: 05/19/2016 9:36:37 AM By: Christin Fudge MD, FACS Entered By: Christin Fudge on 05/19/2016 09:36:37 [name] ([id number]) -------------------------------------------------------------------------------- [place] Details Patient Name: JAMEY, HARMAN Date of Service: 05/19/2016 9:15 AM Medical Record Number: [id number] Patient Account Number: [id number] Date of Birth/Sex: 07-16-1940 (76 y.o. Male) Treating RN: Baruch Gouty, RN, BSN, Velva Harman Primary Care Kaeson Kleinert: [place] Other Clinician: Referring Aleicia Kenagy: [place] Treating Jameyah Fennewald/Extender: Frann Rider in Treatment: 10 Active Inactive ` Orientation to the Wound Care Program Nursing Diagnoses: Knowledge deficit related to the wound healing center program Goals: Patient/caregiver will verbalize understanding of the [place] Program Date Initiated: 03/10/2016 Target Resolution Date: 03/23/2016 Goal Status: Active Interventions: Provide education on orientation to the wound center Notes: ` Soft Tissue Infection Nursing  Diagnoses: Impaired tissue integrity Potential for infection: soft tissue Goals: Patient will remain free of wound infection Date Initiated: 03/10/2016 Target Resolution Date: 03/17/2016 Goal Status: Active Interventions: Assess signs and symptoms of infection every visit Notes: ` Wound/Skin Impairment Nursing Diagnoses: Impaired tissue integrity DONOVIN, KRAEMER ([id number]) Goals: Ulcer/skin breakdown will heal within 14 weeks Date Initiated: 03/10/2016 Target Resolution Date: 03/24/2016 Goal Status: Active Interventions: Assess patient/caregiver ability to obtain necessary supplies Notes: Electronic Signature(s) Signed: 05/19/2016 3:18:42 PM By: Regan Lemming BSN, RN Entered By: Regan Lemming on 05/19/2016 09:28:07 [name] ([id number]) -------------------------------------------------------------------------------- Pain Assessment Details Patient Name: [name] Date of Service: 05/19/2016 9:15 AM Medical Record Number: [id number] Patient Account Number: [id number] Date of Birth/Sex: 04/05/1940 (76 y.o. Male) Treating RN: Baruch Gouty, RN, BSN, Velva Harman Primary Care Jeraldin Fesler: [place] Other Clinician: Referring Derrin Currey: [place] Treating Blessen Kimbrough/Extender: Frann Rider in Treatment: 10 Active Problems Location of Pain Severity and Description of Pain Patient Has Paino No Site Locations With Dressing Change: No Pain Management and Medication Current Pain Management: Electronic Signature(s) Signed: 05/19/2016 3:18:42 PM By: Regan Lemming BSN, RN Entered By: Regan Lemming on  05/19/2016 09:19:55 [name] ([id number]) -------------------------------------------------------------------------------- Wound Assessment Details Patient Name: CLOYD, RAGAS. Date of Service: 05/19/2016 9:15 AM Medical Record Number: [id number] Patient Account Number: [id number] Date of Birth/Sex: Mar 16, 1940 (76 y.o. Male) Treating RN: Baruch Gouty, RN, BSN, [place] Primary Care Everley Evora:  [place] Other Clinician: Referring Zidan Helget: [place] Treating Wyland Rastetter/Extender: Frann Rider in Treatment: 10 Wound Status Wound Number: 1 Primary Pressure Ulcer Etiology: Wound Location: Right Malleolus - Lateral Secondary Diabetic Wound/Ulcer of the Lower Wounding Event: Gradually Appeared Etiology: Extremity Date Acquired: 01/09/2016 Wound Open Weeks Of Treatment: 10 Status: Clustered Wound: No Comorbid Cataracts, Chronic sinus History: problems/congestion, Anemia, Sleep Apnea, Hypertension, Myocardial Infarction, Type II Diabetes, Received Chemotherapy, Received Radiation Photos Photo Uploaded By: Regan Lemming on 05/19/2016 12:46:53 Wound Measurements Length: (cm) 0.8 Width: (cm) 0.9 Depth: (cm) 0.3 Area: (cm) 0.565 Volume: (cm) 0.17 % Reduction in Area: -139.4% % Reduction in Volume: -608.3% Epithelialization: None Tunneling: No Undermining: No Wound Description 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 Severity (Wagner): Grade 1 Slough/Fibrin Wound Margin: Flat and Intact Exudate Amount: Medium Exudate Type: Serous Exudate Color: amber ALDAN, CAMEY ([id number]) er Cleansing: No o Yes Wound Bed Granulation Amount: None Present (0%) Exposed Structure Necrotic Amount: Large (67-100%) Fascia Exposed: No Necrotic Quality: Adherent Slough Fat Layer (Subcutaneous Tissue) Exposed: Yes Tendon Exposed: No Muscle Exposed: No Joint Exposed: No Bone Exposed: No Periwound Skin Texture Texture Color No Abnormalities Noted: No No Abnormalities Noted: No Induration: Yes Erythema: Yes Erythema Location: Circumferential Moisture Erythema Change: Decreased No Abnormalities Noted: No Dry / Scaly: No Temperature / Pain Maceration: Yes Temperature: No Abnormality Tenderness on Palpation: Yes Wound Preparation Ulcer Cleansing: Rinsed/Irrigated with Saline Topical Anesthetic Applied: Other: lidociane 4%, Electronic  Signature(s) Signed: 05/19/2016 3:18:42 PM By: Regan Lemming BSN, RN Entered By: Regan Lemming on 05/19/2016 09:26:04 [name] ([id number]) -------------------------------------------------------------------------------- [place]als Details Patient Name: [name] Date of Service: 05/19/2016 9:15 AM Medical Record Number: [id number] Patient Account Number: [id number] Date of Birth/Sex: [birth year] (76 y.o. Male) Treating RN: Baruch Gouty, RN, BSN, [place] Primary Care Jibri Schriefer: [place] Other Clinician: Referring Dorean Daniello: [place] Treating Kessler Kopinski/Extender: Frann Rider in Treatment: 10 Vital Signs Time Taken: 09:19 Temperature (F): 97.7 Height (in): 67 Pulse (bpm): 75 Weight (lbs): 136 Respiratory Rate (breaths/min): 16 Body Mass Index (BMI): 21.3 Blood Pressure (mmHg): 136/55 Reference Range: 80 - 120 mg / dl Electronic Signature(s) Signed: 05/19/2016 3:18:42 PM By: Regan Lemming BSN, RN Entered By: Regan Lemming on 05/19/2016 09:21:59

## 2016-05-21 NOTE — Progress Notes (Signed)
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  INTERVAL HISTORY:  Patient returns to clinic today for further evaluation and consideration of cycle 2, day 15 of Cyramza and Taxol. His neuropathy is essentially unchanged and worse in his fingertips. He also has complained of flushing in his face one day after treatment. He has a good appetite and his weight is stable. He has no other neurologic complaints. He denies any recent fevers or illnesses. He denies any chest pain or shortness of breath. He denies any nausea, vomiting, constipation, or diarrhea. He has no urinary complaints. Patient otherwise feels well and offers no further specific complaints.  REVIEW OF SYSTEMS:   Review of Systems  Constitutional: Positive for diaphoresis. Negative for fever, malaise/fatigue and weight loss.  HENT: Negative.  Negative for congestion.   Respiratory: Negative for cough and shortness of breath.   Cardiovascular: Negative.  Negative for chest pain and leg swelling.  Gastrointestinal: Negative.  Negative for abdominal pain, blood in stool and melena.  Genitourinary: Negative.   Musculoskeletal: Negative.   Neurological: Positive for sensory change. Negative for weakness.  Psychiatric/Behavioral: Negative.  The patient is not nervous/anxious.     As per HPI. Otherwise, a complete review of systems is negative.  PAST MEDICAL HISTORY: Past Medical History:  Diagnosis Date  . Anemia   . Arthritis   . 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 Diabetes mellitus without  complication ([place])   . Ectatic abdominal aorta ([place]) 08/12/2015  . Esophageal cancer ([place])   . GI bleed   . Hearing loss    left ear  . Hypertension   . Hypothyroidism   . Liver mass   . Mild left atrial enlargement 08/12/2015   Very mild; LA 4.2 cm; echo March 2017  . Mitral valvular regurgitation 08/12/2015  . Sleep apnea   . Stroke ([place])   . TIA (transient ischemic attack)   . 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 APPENDECTOMY    . EUS N/A 10/07/2015   Procedure: FULL UPPER ENDOSCOPIC ULTRASOUND (EUS) RADIAL;  Surgeon: Holly Bodily, MD;  Location: [facility] ENDOSCOPY;  Service: Endoscopy;  Laterality: N/A;  . EYE SURGERY    . FRACTURE SURGERY    . NOSE SURGERY    . PERIPHERAL VASCULAR CATHETERIZATION N/A 10/26/2015   Procedure: Glori Luis Cath Insertion;  Surgeon: Katha Cabal, MD;  Location: [place] [facility];  Service: Cardiovascular;  Laterality: N/A;    FAMILY HISTORY Family History  Problem Relation Age of Onset  . Hypertension Mother   . Lung cancer Father   . Hypertension Brother        ADVANCED DIRECTIVES:    HEALTH MAINTENANCE: Social History  Substance Use Topics  . Smoking status: Former Smoker    Packs/day: 1.00    Types: Cigarettes    Quit date: 10/03/1983  . Smokeless tobacco: Never Used  . Alcohol use 0.0 oz/week     Comment: rarely     Colonoscopy:  PAP:  Bone density:  Lipid  panel:  Allergies  Allergen Reactions  . Sulfa Antibiotics Itching and Hives  . Amoxicillin Hives  . Ampicillin Hives  . Keflex [Cephalexin] Itching  . Lisinopril Other (See Comments)  . Losartan Other (See Comments)  . Sulfamethoxazole-Trimethoprim Other (See Comments)  . Cephalosporins Hives    Current Outpatient Prescriptions  Medication Sig Dispense Refill  . aspirin EC 81 MG tablet Take 81 mg by mouth 2 (two) times daily.     Marland Kitchen BIOTIN PO Take by mouth.     . Cholecalciferol (VITAMIN D3) 1000 UNITS CAPS Take 2,000 Units by mouth daily.     . Coenzyme Q10 (CO Q-10) 200 MG CAPS Take 200 mg by mouth daily.     Marland Kitchen glucose blood (FREESTYLE LITE) test strip Use 2 (two) times daily. Free style lite test strips    . isosorbide mononitrate (IMDUR) 60 MG 24 hr tablet Take 1 tablet (60 mg total) by mouth daily. 30 tablet 0  . levothyroxine (SYNTHROID, LEVOTHROID) 100 MCG tablet Take 100 mcg by mouth every morning.    . lidocaine-prilocaine (EMLA) cream Apply 1 application topically as needed. Apply to port 1-2 hours prior to chemotherapy. Cover with plastic wrap. 30 g 2  . Lutein 40 MG CAPS Take 40 mg by mouth daily.     . Magnesium Oxide 250 MG TABS Take 1 tablet (250 mg total) by mouth daily.  0  . metFORMIN (GLUCOPHAGE) 1000 MG tablet Take 2,000 mg by mouth daily.    . metoprolol tartrate (LOPRESSOR) 25 MG tablet Take 12.5 mg by mouth 2 (two) times daily.     . Misc Natural Products (BEE PROPOLIS PO) Take 1 tablet by mouth 2 (two) times daily.     . Multiple Vitamins-Minerals (MULTI FOR HIM 50+ PO) Take 1 tablet by mouth daily.    . ondansetron (ZOFRAN) 8 MG tablet Take 1 tablet (8 mg total) by mouth every 8 (eight) hours as needed for nausea or vomiting. 30 tablet 2  . prochlorperazine (COMPAZINE) 10 MG tablet Take 1 tablet (10 mg total) by mouth every 6 (six) hours as needed for nausea or vomiting. 30 tablet 2  . Red Yeast Rice 600 MG TABS Take 2 tablets by mouth at bedtime.    . sucralfate (CARAFATE) 1 g tablet Take 1 tablet (1 g total) by mouth 3 (three) times daily. Dissolve each tablet in 2-3 tbsp warm water, swish and swallow. 90 tablet 2  . Wound Dressings (MEDIHONEY WOUND/BURN DRESSING) PSTE   0   No current facility-administered medications for this visit.    Facility-Administered Medications Ordered in Other Visits  Medication Dose Route Frequency Provider Last Rate Last Dose  . 0.9 %  sodium chloride infusion   Intravenous Continuous  Lloyd Huger, MD        OBJECTIVE: Vitals:   05/22/16 1018  BP: 137/78  Pulse: 86  Temp: 97.6 F (36.4 C)     Body mass index is 20.94 kg/m.    ECOG FS:1 - Symptomatic but completely ambulatory  General: Well-developed, well-nourished, no acute distress. Eyes: Pink conjunctiva, anicteric sclera. Lungs: Clear to auscultation bilaterally. Heart: Regular rate and rhythm. No rubs, murmurs, or gallops. Abdomen: Soft, nontender, nondistended. No organomegaly noted, normoactive bowel sounds. Musculoskeletal: No edema, cyanosis, or clubbing. Neuro: Alert, answering all questions appropriately. Cranial nerves grossly intact. Skin: No rashes or petechiae noted. 1 cm ulcer on right ankle unchanged. Psych: Normal affect.   LAB RESULTS:  Lab Results  Component Value Date  NA 130 (L) 05/22/2016   K 3.9 05/22/2016   CL 97 (L) 05/22/2016   CO2 24 05/22/2016   GLUCOSE 198 (H) 05/22/2016   BUN 12 05/22/2016   CREATININE 0.57 (L) 05/22/2016   CALCIUM 9.0 05/22/2016   PROT 7.1 05/22/2016   ALBUMIN 3.8 05/22/2016   AST 33 05/22/2016   ALT 18 05/22/2016   ALKPHOS 60 05/22/2016   BILITOT 0.3 05/22/2016   GFRNONAA >60 05/22/2016   GFRAA >60 05/22/2016    Lab Results  Component Value Date   WBC 3.6 (L) 05/22/2016   NEUTROABS 2.9 05/22/2016   HGB 9.7 (L) 05/22/2016   HCT 29.0 (L) 05/22/2016   MCV 89.3 05/22/2016   PLT 531 (H) 05/22/2016    Lab Results  Component Value Date   CA199 13,400 (H) 05/22/2016   Lab Results  Component Value Date   CEA 455.6 (H) 05/22/2016     STUDIES: No results found.  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  PLAN:    1. 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 Patient's tumor markers are Essentially unchanged. He received cycle 9 of FOLFOX plus  Herceptin + Neulasta on 03/27/2016.  Patient will receive Cyramza on days 1 and 15 and Taxol on days 1, 8, and 15. Patient will have day 22 off.  Proceed with cycle 2, day 15 today. Patient missed cycle 1, day 15. Continue with dose reduced Taxol secondary to peripheral neuropathy. Return to clinic in 2 weeks for consideration of cycle 3, day 1. 2. Iron deficiency anemia: Consider IV iron in the future. 3. Cardiac disease: Treatment per cardiology. MUGA scan on March 20, 2016 reported an EF of 52.8%, unchanged. 4. Weight loss: Improving, monitor. 5. Dysphagia: XRT has been completed.  Patient denies any issues swallowing. 6. Weakness and fatigue: Improving. 7. Ankle ulcer: Nonhealing, continue treatment per wound care.  Dressing intact. 8. Peripheral neuropathy: Dose reduce Taxol as above. 9. Flushing: Unclear etiology. Possibly delayed reaction to either Taxol or Cyramza. Monitor.  Patient expressed understanding and was in agreement with this plan. He also understands that He can call clinic at any time with any questions, concerns, or complaints.    Lloyd Huger, MD   05/28/2016 10:34 AM

## 2016-05-22 ENCOUNTER — Inpatient Hospital Stay: Payer: Medicare Other

## 2016-05-22 ENCOUNTER — Inpatient Hospital Stay (HOSPITAL_BASED_OUTPATIENT_CLINIC_OR_DEPARTMENT_OTHER): Payer: Medicare Other | Admitting: Oncology

## 2016-05-22 VITALS — BP 137/78 | HR 86 | Temp 97.6°F | Wt 133.7 lb

## 2016-05-22 VITALS — BP 159/76 | HR 87

## 2016-05-22 DIAGNOSIS — D649 Anemia, unspecified: Secondary | ICD-10-CM

## 2016-05-22 DIAGNOSIS — R1310 Dysphagia, unspecified: Secondary | ICD-10-CM

## 2016-05-22 DIAGNOSIS — E039 Hypothyroidism, unspecified: Secondary | ICD-10-CM

## 2016-05-22 DIAGNOSIS — C155 Malignant neoplasm of lower third of esophagus: Secondary | ICD-10-CM | POA: Diagnosis not present

## 2016-05-22 DIAGNOSIS — M129 Arthropathy, unspecified: Secondary | ICD-10-CM

## 2016-05-22 DIAGNOSIS — I10 Essential (primary) hypertension: Secondary | ICD-10-CM

## 2016-05-22 DIAGNOSIS — Z7984 Long term (current) use of oral hypoglycemic drugs: Secondary | ICD-10-CM

## 2016-05-22 DIAGNOSIS — Z7982 Long term (current) use of aspirin: Secondary | ICD-10-CM

## 2016-05-22 DIAGNOSIS — Z800 Family history of malignant neoplasm of digestive organs: Secondary | ICD-10-CM

## 2016-05-22 DIAGNOSIS — E119 Type 2 diabetes mellitus without complications: Secondary | ICD-10-CM

## 2016-05-22 DIAGNOSIS — G629 Polyneuropathy, unspecified: Secondary | ICD-10-CM | POA: Diagnosis not present

## 2016-05-22 DIAGNOSIS — Z87891 Personal history of nicotine dependence: Secondary | ICD-10-CM

## 2016-05-22 DIAGNOSIS — R531 Weakness: Secondary | ICD-10-CM

## 2016-05-22 DIAGNOSIS — Q2546 Tortuous aortic arch: Secondary | ICD-10-CM

## 2016-05-22 DIAGNOSIS — M5134 Other intervertebral disc degeneration, thoracic region: Secondary | ICD-10-CM

## 2016-05-22 DIAGNOSIS — R05 Cough: Secondary | ICD-10-CM

## 2016-05-22 DIAGNOSIS — C787 Secondary malignant neoplasm of liver and intrahepatic bile duct: Secondary | ICD-10-CM | POA: Diagnosis not present

## 2016-05-22 DIAGNOSIS — R5383 Other fatigue: Secondary | ICD-10-CM | POA: Diagnosis not present

## 2016-05-22 DIAGNOSIS — Z79899 Other long term (current) drug therapy: Secondary | ICD-10-CM

## 2016-05-22 DIAGNOSIS — R634 Abnormal weight loss: Secondary | ICD-10-CM | POA: Diagnosis not present

## 2016-05-22 DIAGNOSIS — Z8673 Personal history of transient ischemic attack (TIA), and cerebral infarction without residual deficits: Secondary | ICD-10-CM

## 2016-05-22 DIAGNOSIS — I517 Cardiomegaly: Secondary | ICD-10-CM

## 2016-05-22 DIAGNOSIS — G4730 Sleep apnea, unspecified: Secondary | ICD-10-CM

## 2016-05-22 DIAGNOSIS — Z5111 Encounter for antineoplastic chemotherapy: Secondary | ICD-10-CM | POA: Diagnosis not present

## 2016-05-22 LAB — COMPREHENSIVE METABOLIC PANEL
ALT: 18 U/L (ref 17–63)
AST: 33 U/L (ref 15–41)
Albumin: 3.8 g/dL (ref 3.5–5.0)
Alkaline Phosphatase: 60 U/L (ref 38–126)
Anion gap: 9 (ref 5–15)
BUN: 12 mg/dL (ref 6–20)
CHLORIDE: 97 mmol/L — AB (ref 101–111)
CO2: 24 mmol/L (ref 22–32)
CREATININE: 0.57 mg/dL — AB (ref 0.61–1.24)
Calcium: 9 mg/dL (ref 8.9–10.3)
Glucose, Bld: 198 mg/dL — ABNORMAL HIGH (ref 65–99)
POTASSIUM: 3.9 mmol/L (ref 3.5–5.1)
Sodium: 130 mmol/L — ABNORMAL LOW (ref 135–145)
Total Bilirubin: 0.3 mg/dL (ref 0.3–1.2)
Total Protein: 7.1 g/dL (ref 6.5–8.1)

## 2016-05-22 LAB — CBC WITH DIFFERENTIAL/PLATELET
BASOS ABS: 0.1 10*3/uL (ref 0–0.1)
Basophils Relative: 2 %
EOS PCT: 2 %
Eosinophils Absolute: 0.1 10*3/uL (ref 0–0.7)
HCT: 29 % — ABNORMAL LOW (ref 40.0–52.0)
Hemoglobin: 9.7 g/dL — ABNORMAL LOW (ref 13.0–18.0)
LYMPHS PCT: 7 %
Lymphs Abs: 0.2 10*3/uL — ABNORMAL LOW (ref 1.0–3.6)
MCH: 29.9 pg (ref 26.0–34.0)
MCHC: 33.5 g/dL (ref 32.0–36.0)
MCV: 89.3 fL (ref 80.0–100.0)
MONO ABS: 0.3 10*3/uL (ref 0.2–1.0)
MONOS PCT: 8 %
Neutro Abs: 2.9 10*3/uL (ref 1.4–6.5)
Neutrophils Relative %: 81 %
PLATELETS: 531 10*3/uL — AB (ref 150–440)
RBC: 3.24 MIL/uL — ABNORMAL LOW (ref 4.40–5.90)
RDW: 20.4 % — AB (ref 11.5–14.5)
WBC: 3.6 10*3/uL — ABNORMAL LOW (ref 3.8–10.6)

## 2016-05-22 MED ORDER — ACETAMINOPHEN 325 MG PO TABS
650.0000 mg | ORAL_TABLET | Freq: Once | ORAL | Status: AC
  Administered 2016-05-22: 650 mg via ORAL
  Filled 2016-05-22: qty 2

## 2016-05-22 MED ORDER — SODIUM CHLORIDE 0.9 % IV SOLN
72.0000 mg/m2 | Freq: Once | INTRAVENOUS | Status: AC
  Administered 2016-05-22: 120 mg via INTRAVENOUS
  Filled 2016-05-22: qty 20

## 2016-05-22 MED ORDER — FAMOTIDINE IN NACL 20-0.9 MG/50ML-% IV SOLN
20.0000 mg | Freq: Once | INTRAVENOUS | Status: AC
  Administered 2016-05-22: 20 mg via INTRAVENOUS
  Filled 2016-05-22: qty 50

## 2016-05-22 MED ORDER — DIPHENHYDRAMINE HCL 50 MG/ML IJ SOLN
25.0000 mg | Freq: Once | INTRAMUSCULAR | Status: AC
  Administered 2016-05-22: 25 mg via INTRAVENOUS
  Filled 2016-05-22: qty 1

## 2016-05-22 MED ORDER — SODIUM CHLORIDE 0.9% FLUSH
10.0000 mL | Freq: Once | INTRAVENOUS | Status: AC
  Administered 2016-05-22: 10 mL via INTRAVENOUS
  Filled 2016-05-22: qty 10

## 2016-05-22 MED ORDER — SODIUM CHLORIDE 0.9 % IV SOLN
8.0000 mg/kg | Freq: Once | INTRAVENOUS | Status: AC
  Administered 2016-05-22: 500 mg via INTRAVENOUS
  Filled 2016-05-22: qty 50

## 2016-05-22 MED ORDER — DEXAMETHASONE SODIUM PHOSPHATE 10 MG/ML IJ SOLN
10.0000 mg | Freq: Once | INTRAMUSCULAR | Status: AC
  Administered 2016-05-22: 10 mg via INTRAVENOUS
  Filled 2016-05-22: qty 1

## 2016-05-22 MED ORDER — SODIUM CHLORIDE 0.9 % IV SOLN: 10.0000 mg | Freq: Once | INTRAVENOUS | Status: DC

## 2016-05-22 MED ORDER — HEPARIN SOD (PORK) LOCK FLUSH 100 UNIT/ML IV SOLN
500.0000 [IU] | Freq: Once | INTRAVENOUS | Status: AC
  Administered 2016-05-22: 500 [IU] via INTRAVENOUS
  Filled 2016-05-22: qty 5

## 2016-05-22 MED ORDER — SODIUM CHLORIDE 0.9 % IV SOLN
Freq: Once | INTRAVENOUS | Status: AC
  Administered 2016-05-22: 11:00:00 via INTRAVENOUS
  Filled 2016-05-22: qty 1000

## 2016-05-22 NOTE — Progress Notes (Signed)
Patient here for pre treatment check. He is experiencing numbness and tingling in bilateral upper extremities, he also had flushing of his face one day post treatment as well as swollen ankles.

## 2016-05-23 LAB — CANCER ANTIGEN 19-9: CA 19-9: 13400 U/mL — ABNORMAL HIGH (ref 0–35)

## 2016-05-23 LAB — CEA: CEA: 455.6 ng/mL — ABNORMAL HIGH (ref 0.0–4.7)

## 2016-05-24 ENCOUNTER — Ambulatory Visit
Admission: RE | Admit: 2016-05-24 | Discharge: 2016-05-24 | Disposition: A | Discharge status: Home / Self Care | Payer: Medicare Other | Source: Ambulatory Visit | Attending: Radiation Oncology | Admitting: Radiation Oncology

## 2016-05-24 ENCOUNTER — Encounter: Payer: Self-pay | Admitting: Radiation Oncology

## 2016-05-24 VITALS — BP 153/75 | HR 88 | Temp 96.0°F | Resp 20 | Wt 134.9 lb

## 2016-05-24 DIAGNOSIS — Z87891 Personal history of nicotine dependence: Secondary | ICD-10-CM | POA: Diagnosis not present

## 2016-05-24 DIAGNOSIS — C787 Secondary malignant neoplasm of liver and intrahepatic bile duct: Secondary | ICD-10-CM | POA: Insufficient documentation

## 2016-05-24 DIAGNOSIS — Z923 Personal history of irradiation: Secondary | ICD-10-CM | POA: Diagnosis not present

## 2016-05-24 DIAGNOSIS — C155 Malignant neoplasm of lower third of esophagus: Secondary | ICD-10-CM | POA: Insufficient documentation

## 2016-05-24 DIAGNOSIS — C781 Secondary malignant neoplasm of mediastinum: Secondary | ICD-10-CM | POA: Insufficient documentation

## 2016-05-24 DIAGNOSIS — C7800 Secondary malignant neoplasm of unspecified lung: Secondary | ICD-10-CM | POA: Diagnosis not present

## 2016-05-24 NOTE — Progress Notes (Signed)
Radiation Oncology Follow up Note  Name: [name]   Date:   05/24/2016 MRN:  [id number] DOB: [birth year]    This 76 y.o. male presents to the clinic today for 5 month follow-up status post palliative radiation therapy for stage IV esophageal cancer of the lower esophagus.  REFERRING PROVIDER: Arnetha Courser, MD  HPI: Patient is a 76 year old male now out 5 months having completed palliative radiation therapy to his distal esophagus for adenocarcinoma. He has stage IV disease with known involvement of his liver., Lung and mediastinum. He is currently receiving oCyramza and Taxol which she is tolerating well. He is oriented completed FOLFOX chemotherapy. Recent PET CT scan showed mild progression of hypermetabolic adenopathy and right paratracheal region celiac axis and inguinal region. Also hypermetabolic liver metastasis. He also has tiny polyp bilateral pulmonary metastasis. He is swallowing well. He is without complaint. Specifically denies cough hemoptysis or chest tightness.  COMPLICATIONS OF TREATMENT: none  FOLLOW UP COMPLIANCE: keeps appointments   PHYSICAL EXAM:  BP (!) 153/75   Pulse 88   Temp (!) 96 F (35.6 C)   Resp 20   Wt 134 lb 14.7 oz (61.2 kg)   BMI 21.13 kg/m  Well-developed well-nourished patient in NAD. HEENT reveals PERLA, EOMI, discs not visualized.  Oral cavity is clear. No oral mucosal lesions are identified. Neck is clear without evidence of cervical or supraclavicular adenopathy. Lungs are clear to A&P. Cardiac examination is essentially unremarkable with regular rate and rhythm without murmur rub or thrill. Abdomen is benign with no organomegaly or masses noted. Motor sensory and DTR levels are equal and symmetric in the upper and lower extremities. Cranial nerves II through XII are grossly intact. Proprioception is intact. No peripheral adenopathy or edema is identified. No motor or sensory levels are noted. Crude visual fields are within normal  range.  RADIOLOGY RESULTS: PET CT scan is reviewed and compatible with the above-stated findings  PLAN: Present time patient continues on palliative chemotherapy under medical oncology's direction. He appears stable at this time very asymptomatic. I have asked to see him back in 6 months for follow-up. I would reevaluate the patient any time should further palliative treatment be indicated.  I would like to take this opportunity to thank you for allowing me to participate in the care of your patient.Armstead Peaks., MD

## 2016-05-25 ENCOUNTER — Telehealth: Payer: Self-pay | Admitting: *Deleted

## 2016-05-25 NOTE — Telephone Encounter (Signed)
He has broken his tooth and she is asking what they are to do about it. His  Last treatment was on 05/22/16 with Cyramza and Taxol.Please advise

## 2016-05-25 NOTE — Telephone Encounter (Signed)
Per Dr Janese Banks, the patient needs to be evaluated by a dentist to see what is needed. He should not have any drilling done at this time. Perhaps the dentist can put on abx or do something non invasive for a few weeks while the Cyramza gets out of his system. Dr Grayland Ormond can make a decision next week if intervention is necessary. I discussed this with [name], who stated she does not know if she can get him into a dentist due to the dentists schedules and argued that he has GOT to get his treatment on the 23 rd. She also reports that the tooth has been chipping away for a few weeks and he keeps picking at it. I gave her a suggestion of calling Dr Wilkie Aye, but she states she does not know where [name] is located and she is not going to try to get herself lost. I advised she call around to find a dentist to evaluate his needs and to let us know before any invasive work gets done. We will need to check labs on him first at a minimum. She voiced understanding

## 2016-05-29 NOTE — Telephone Encounter (Signed)
Patient is going to be seen by Dr. Marliss Czar on Wednesday and they will let us know what they need to do for patients tooth

## 2016-05-30 ENCOUNTER — Telehealth: Payer: Self-pay | Admitting: *Deleted

## 2016-05-30 NOTE — Telephone Encounter (Signed)
Pt needs to have 2 teeth extracted plus have a filling and cleaning performed. Dental office will need clearance prior to scheduling procedure. Next chemo scheduled for Monday 4/23. Please advise.

## 2016-05-30 NOTE — Telephone Encounter (Signed)
i'll see him Monday and coordinate from there.  Thanks!

## 2016-06-01 ENCOUNTER — Ambulatory Visit: Payer: Medicare Other | Admitting: Surgery

## 2016-06-01 DIAGNOSIS — E039 Hypothyroidism, unspecified: Secondary | ICD-10-CM | POA: Diagnosis not present

## 2016-06-01 DIAGNOSIS — E119 Type 2 diabetes mellitus without complications: Secondary | ICD-10-CM | POA: Diagnosis not present

## 2016-06-05 ENCOUNTER — Inpatient Hospital Stay: Payer: Medicare Other

## 2016-06-05 ENCOUNTER — Inpatient Hospital Stay (HOSPITAL_BASED_OUTPATIENT_CLINIC_OR_DEPARTMENT_OTHER): Payer: Medicare Other | Admitting: Oncology

## 2016-06-05 VITALS — BP 145/71 | HR 81 | Temp 96.6°F | Resp 18 | Wt 133.4 lb

## 2016-06-05 DIAGNOSIS — G629 Polyneuropathy, unspecified: Secondary | ICD-10-CM

## 2016-06-05 DIAGNOSIS — R05 Cough: Secondary | ICD-10-CM | POA: Diagnosis not present

## 2016-06-05 DIAGNOSIS — Z5111 Encounter for antineoplastic chemotherapy: Secondary | ICD-10-CM | POA: Diagnosis not present

## 2016-06-05 DIAGNOSIS — Z800 Family history of malignant neoplasm of digestive organs: Secondary | ICD-10-CM

## 2016-06-05 DIAGNOSIS — R634 Abnormal weight loss: Secondary | ICD-10-CM

## 2016-06-05 DIAGNOSIS — G4730 Sleep apnea, unspecified: Secondary | ICD-10-CM

## 2016-06-05 DIAGNOSIS — C155 Malignant neoplasm of lower third of esophagus: Secondary | ICD-10-CM

## 2016-06-05 DIAGNOSIS — E039 Hypothyroidism, unspecified: Secondary | ICD-10-CM

## 2016-06-05 DIAGNOSIS — R1310 Dysphagia, unspecified: Secondary | ICD-10-CM | POA: Diagnosis not present

## 2016-06-05 DIAGNOSIS — M5134 Other intervertebral disc degeneration, thoracic region: Secondary | ICD-10-CM

## 2016-06-05 DIAGNOSIS — E119 Type 2 diabetes mellitus without complications: Secondary | ICD-10-CM | POA: Diagnosis not present

## 2016-06-05 DIAGNOSIS — R5383 Other fatigue: Secondary | ICD-10-CM

## 2016-06-05 DIAGNOSIS — I517 Cardiomegaly: Secondary | ICD-10-CM

## 2016-06-05 DIAGNOSIS — R531 Weakness: Secondary | ICD-10-CM

## 2016-06-05 DIAGNOSIS — I10 Essential (primary) hypertension: Secondary | ICD-10-CM

## 2016-06-05 DIAGNOSIS — Z87891 Personal history of nicotine dependence: Secondary | ICD-10-CM

## 2016-06-05 DIAGNOSIS — D649 Anemia, unspecified: Secondary | ICD-10-CM

## 2016-06-05 DIAGNOSIS — Z7982 Long term (current) use of aspirin: Secondary | ICD-10-CM

## 2016-06-05 DIAGNOSIS — Z8673 Personal history of transient ischemic attack (TIA), and cerebral infarction without residual deficits: Secondary | ICD-10-CM

## 2016-06-05 DIAGNOSIS — Q2546 Tortuous aortic arch: Secondary | ICD-10-CM

## 2016-06-05 DIAGNOSIS — C787 Secondary malignant neoplasm of liver and intrahepatic bile duct: Secondary | ICD-10-CM

## 2016-06-05 DIAGNOSIS — M129 Arthropathy, unspecified: Secondary | ICD-10-CM

## 2016-06-05 DIAGNOSIS — Z7984 Long term (current) use of oral hypoglycemic drugs: Secondary | ICD-10-CM

## 2016-06-05 DIAGNOSIS — Z79899 Other long term (current) drug therapy: Secondary | ICD-10-CM

## 2016-06-05 LAB — COMPREHENSIVE METABOLIC PANEL
ALT: 18 U/L (ref 17–63)
AST: 43 U/L — ABNORMAL HIGH (ref 15–41)
Albumin: 3.8 g/dL (ref 3.5–5.0)
Alkaline Phosphatase: 69 U/L (ref 38–126)
Anion gap: 11 (ref 5–15)
BUN: 13 mg/dL (ref 6–20)
CHLORIDE: 96 mmol/L — AB (ref 101–111)
CO2: 24 mmol/L (ref 22–32)
Calcium: 8.9 mg/dL (ref 8.9–10.3)
Creatinine, Ser: 0.68 mg/dL (ref 0.61–1.24)
Glucose, Bld: 188 mg/dL — ABNORMAL HIGH (ref 65–99)
POTASSIUM: 4.1 mmol/L (ref 3.5–5.1)
Sodium: 131 mmol/L — ABNORMAL LOW (ref 135–145)
TOTAL PROTEIN: 7.2 g/dL (ref 6.5–8.1)
Total Bilirubin: 0.4 mg/dL (ref 0.3–1.2)

## 2016-06-05 LAB — CBC WITH DIFFERENTIAL/PLATELET
Basophils Absolute: 0.1 10*3/uL (ref 0–0.1)
Basophils Relative: 1 %
EOS PCT: 1 %
Eosinophils Absolute: 0 10*3/uL (ref 0–0.7)
HCT: 27.5 % — ABNORMAL LOW (ref 40.0–52.0)
Hemoglobin: 9.2 g/dL — ABNORMAL LOW (ref 13.0–18.0)
LYMPHS ABS: 0.4 10*3/uL — AB (ref 1.0–3.6)
LYMPHS PCT: 8 %
MCH: 29.2 pg (ref 26.0–34.0)
MCHC: 33.4 g/dL (ref 32.0–36.0)
MCV: 87.3 fL (ref 80.0–100.0)
MONO ABS: 0.9 10*3/uL (ref 0.2–1.0)
Monocytes Relative: 16 %
Neutro Abs: 4.2 10*3/uL (ref 1.4–6.5)
Neutrophils Relative %: 74 %
PLATELETS: 518 10*3/uL — AB (ref 150–440)
RBC: 3.15 MIL/uL — ABNORMAL LOW (ref 4.40–5.90)
RDW: 21.7 % — AB (ref 11.5–14.5)
WBC: 5.7 10*3/uL (ref 3.8–10.6)

## 2016-06-05 MED ORDER — HEPARIN SOD (PORK) LOCK FLUSH 100 UNIT/ML IV SOLN: 500.0000 [IU] | Freq: Once | INTRAVENOUS | Status: DC | PRN

## 2016-06-05 MED ORDER — DEXAMETHASONE SODIUM PHOSPHATE 10 MG/ML IJ SOLN
10.0000 mg | Freq: Once | INTRAMUSCULAR | Status: AC
  Administered 2016-06-05: 10 mg via INTRAVENOUS
  Filled 2016-06-05: qty 1

## 2016-06-05 MED ORDER — DIPHENHYDRAMINE HCL 50 MG/ML IJ SOLN
25.0000 mg | Freq: Once | INTRAMUSCULAR | Status: AC
  Administered 2016-06-05: 25 mg via INTRAVENOUS
  Filled 2016-06-05: qty 1

## 2016-06-05 MED ORDER — SODIUM CHLORIDE 0.9 % IV SOLN
Freq: Once | INTRAVENOUS | Status: AC
  Administered 2016-06-05: 11:00:00 via INTRAVENOUS
  Filled 2016-06-05: qty 1000

## 2016-06-05 MED ORDER — RAMUCIRUMAB CHEMO INJECTION 500 MG/50ML
8.0000 mg/kg | Freq: Once | INTRAVENOUS | Status: AC
  Administered 2016-06-05: 500 mg via INTRAVENOUS
  Filled 2016-06-05: qty 50

## 2016-06-05 MED ORDER — ACETAMINOPHEN 325 MG PO TABS
650.0000 mg | ORAL_TABLET | Freq: Once | ORAL | Status: AC
  Administered 2016-06-05: 650 mg via ORAL
  Filled 2016-06-05: qty 2

## 2016-06-05 MED ORDER — SODIUM CHLORIDE 0.9 % IV SOLN: 10.0000 mg | Freq: Once | INTRAVENOUS | Status: DC

## 2016-06-05 MED ORDER — FAMOTIDINE IN NACL 20-0.9 MG/50ML-% IV SOLN
20.0000 mg | Freq: Once | INTRAVENOUS | Status: AC
  Administered 2016-06-05: 20 mg via INTRAVENOUS
  Filled 2016-06-05: qty 50

## 2016-06-05 MED ORDER — SODIUM CHLORIDE 0.9% FLUSH
10.0000 mL | Freq: Once | INTRAVENOUS | Status: AC
  Administered 2016-06-05: 10 mL via INTRAVENOUS
  Filled 2016-06-05: qty 10

## 2016-06-05 MED ORDER — HEPARIN SOD (PORK) LOCK FLUSH 100 UNIT/ML IV SOLN
500.0000 [IU] | Freq: Once | INTRAVENOUS | Status: AC
  Administered 2016-06-05: 500 [IU] via INTRAVENOUS
  Filled 2016-06-05: qty 5

## 2016-06-05 NOTE — Progress Notes (Signed)
Per pt's spouse, pt has had frequent cough, difficulty swallowing, numbness in fingertips which effects daily living activities, and unsteady gait at times.

## 2016-06-05 NOTE — Progress Notes (Signed)
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  INTERVAL HISTORY:  Patient returns to clinic today for further evaluation and consideration of cycle 3, day 1 of Cyramza and Taxol. His neuropathy is getting worse and he has difficulty buttoning his shirt. He has a good appetite, although his weight has trended down slightly. He has no other neurologic complaints. He denies any recent fevers or illnesses. He denies any chest pain or shortness of breath. He denies any nausea, vomiting, constipation, or diarrhea. He has no urinary complaints. Patient otherwise feels well and offers no further specific complaints.  REVIEW OF SYSTEMS:   Review of Systems  Constitutional: Positive for diaphoresis. Negative for fever, malaise/fatigue and weight loss.  HENT: Negative.  Negative for congestion.   Respiratory: Negative for cough and shortness of breath.   Cardiovascular: Negative.  Negative for chest pain and leg swelling.  Gastrointestinal: Negative.  Negative for abdominal pain, blood in stool and melena.  Genitourinary: Negative.   Musculoskeletal: Negative.   Neurological: Positive for sensory change. Negative for weakness.  Psychiatric/Behavioral: Negative.  The patient is not nervous/anxious.     As per HPI. Otherwise, a complete review of systems is negative.  PAST MEDICAL HISTORY: Past Medical History:  Diagnosis Date  . Anemia   . Arthritis   . 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 Diabetes mellitus without complication ([place])   . Ectatic abdominal aorta ([place])  08/12/2015  . Esophageal cancer ([place])   . GI bleed   . Hearing loss    left ear  . Hypertension   . Hypothyroidism   . Liver mass   . Mild left atrial enlargement 08/12/2015   Very mild; LA 4.2 cm; echo March 2017  . Mitral valvular regurgitation 08/12/2015  . Sleep apnea   . Stroke ([place])   . TIA (transient ischemic attack)   . 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 APPENDECTOMY    . EUS N/A 10/07/2015   Procedure: FULL UPPER ENDOSCOPIC ULTRASOUND (EUS) RADIAL;  Surgeon: Holly Bodily, MD;  Location: [facility] ENDOSCOPY;  Service: Endoscopy;  Laterality: N/A;  . EYE SURGERY    . FRACTURE SURGERY    . NOSE SURGERY    . PERIPHERAL VASCULAR CATHETERIZATION N/A 10/26/2015   Procedure: Glori Luis Cath Insertion;  Surgeon: Katha Cabal, MD;  Location: [place] [facility];  Service: Cardiovascular;  Laterality: N/A;    FAMILY HISTORY Family History  Problem Relation Age of Onset  . Hypertension Mother   . Lung cancer Father   . Hypertension Brother        ADVANCED DIRECTIVES:    HEALTH MAINTENANCE: Social History  Substance Use Topics  . Smoking status: Former Smoker    Packs/day: 1.00    Types: Cigarettes    Quit date: 10/03/1983  . Smokeless tobacco: Never Used  . Alcohol use 0.0 oz/week     Comment: rarely     Colonoscopy:  PAP:  Bone density:  Lipid panel:  Allergies  Allergen Reactions  . Sulfa  Antibiotics Itching and Hives  . Amoxicillin Hives  . Ampicillin Hives  . Keflex [Cephalexin] Itching  . Lisinopril Other (See Comments)  . Losartan Other (See Comments)  . Sulfamethoxazole-Trimethoprim Other (See Comments)  . Cephalosporins Hives    Current Outpatient Prescriptions  Medication Sig Dispense Refill  . aspirin EC 81 MG tablet Take 81 mg by mouth 2 (two) times daily.     Marland Kitchen BIOTIN PO Take by mouth.    . Cholecalciferol (VITAMIN D3) 1000 UNITS CAPS  Take 2,000 Units by mouth daily.     . Coenzyme Q10 (CO Q-10) 200 MG CAPS Take 200 mg by mouth daily.     Marland Kitchen glucose blood (FREESTYLE LITE) test strip Use 2 (two) times daily. Free style lite test strips    . isosorbide mononitrate (IMDUR) 60 MG 24 hr tablet Take 1 tablet (60 mg total) by mouth daily. 30 tablet 0  . levothyroxine (SYNTHROID, LEVOTHROID) 100 MCG tablet Take 100 mcg by mouth every morning.    . lidocaine-prilocaine (EMLA) cream Apply 1 application topically as needed. Apply to port 1-2 hours prior to chemotherapy. Cover with plastic wrap. 30 g 2  . Lutein 40 MG CAPS Take 40 mg by mouth daily.     . Magnesium Oxide 250 MG TABS Take 1 tablet (250 mg total) by mouth daily.  0  . metFORMIN (GLUCOPHAGE) 1000 MG tablet Take 2,000 mg by mouth daily.    . metoprolol tartrate (LOPRESSOR) 25 MG tablet Take 12.5 mg by mouth 2 (two) times daily.     . Misc Natural Products (BEE PROPOLIS PO) Take 1 tablet by mouth 2 (two) times daily.     . Multiple Vitamins-Minerals (MULTI FOR HIM 50+ PO) Take 1 tablet by mouth daily.    . ondansetron (ZOFRAN) 8 MG tablet Take 1 tablet (8 mg total) by mouth every 8 (eight) hours as needed for nausea or vomiting. 30 tablet 2  . prochlorperazine (COMPAZINE) 10 MG tablet Take 1 tablet (10 mg total) by mouth every 6 (six) hours as needed for nausea or vomiting. 30 tablet 2  . Red Yeast Rice 600 MG TABS Take 2 tablets by mouth at bedtime.    . sucralfate (CARAFATE) 1 g tablet Take 1 tablet (1 g total) by mouth 3 (three) times daily. Dissolve each tablet in 2-3 tbsp warm water, swish and swallow. 90 tablet 2  . Wound Dressings (MEDIHONEY WOUND/BURN DRESSING) PSTE   0   No current facility-administered medications for this visit.    Facility-Administered Medications Ordered in Other Visits  Medication Dose Route Frequency Provider Last Rate Last Dose  . 0.9 %  sodium chloride infusion   Intravenous Continuous Lloyd Huger, MD      . heparin lock flush 100  unit/mL  500 Units Intravenous Once Lloyd Huger, MD      . heparin lock flush 100 unit/mL  500 Units Intracatheter Once PRN Lloyd Huger, MD      . ramucirumab [facility]) 500 mg in sodium chloride 0.9 % 200 mL chemo infusion  8 mg/kg (Treatment Plan Recorded) Intravenous Once Lloyd Huger, MD 250 mL/hr at 06/05/16 1118 500 mg at 06/05/16 1118    OBJECTIVE: Vitals:   06/05/16 0946  BP: (!) 145/71  Pulse: 81  Resp: 18  Temp: (!) 96.6 F (35.9 C)     Body mass index is 20.89 kg/m.    ECOG FS:1 - Symptomatic but completely ambulatory  General: Well-developed, well-nourished, no acute  distress. Eyes: Pink conjunctiva, anicteric sclera. Lungs: Clear to auscultation bilaterally. Heart: Regular rate and rhythm. No rubs, murmurs, or gallops. Abdomen: Soft, nontender, nondistended. No organomegaly noted, normoactive bowel sounds. Musculoskeletal: No edema, cyanosis, or clubbing. Neuro: Alert, answering all questions appropriately. Cranial nerves grossly intact. Skin: No rashes or petechiae noted. 1 cm ulcer on right ankle unchanged. Psych: Normal affect.   LAB RESULTS:  Lab Results  Component Value Date   NA 131 (L) 06/05/2016   K 4.1 06/05/2016   CL 96 (L) 06/05/2016   CO2 24 06/05/2016   GLUCOSE 188 (H) 06/05/2016   BUN 13 06/05/2016   CREATININE 0.68 06/05/2016   CALCIUM 8.9 06/05/2016   PROT 7.2 06/05/2016   ALBUMIN 3.8 06/05/2016   AST 43 (H) 06/05/2016   ALT 18 06/05/2016   ALKPHOS 69 06/05/2016   BILITOT 0.4 06/05/2016   GFRNONAA >60 06/05/2016   GFRAA >60 06/05/2016    Lab Results  Component Value Date   WBC 5.7 06/05/2016   NEUTROABS 4.2 06/05/2016   HGB 9.2 (L) 06/05/2016   HCT 27.5 (L) 06/05/2016   MCV 87.3 06/05/2016   PLT 518 (H) 06/05/2016    Lab Results  Component Value Date   CA199 13,400 (H) 05/22/2016   Lab Results  Component Value Date   CEA 455.6 (H) 05/22/2016     STUDIES: No results found.  ASSESSMENT:  Stage IV  adenocarcinoma of the lower third esophagus, HER-2 positive, with liver metastasis.  PLAN:    1. 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 Patient's tumor markers are essentially unchanged. He received cycle 9 of FOLFOX plus Herceptin + Neulasta on 03/27/2016.  Patient will receive Cyramza on days 1 and 15 and Taxol on days 1, 8, and 15. Patient will have day 22 off.  Proceed with cycle 3, day 1 today, But will discontinue Taxol given his worsening neuropathy. No treatment next week, but patient will return to clinic in 2 weeks for consideration of cycle 3, day 15 which will be Cyramza only. 2. Iron deficiency anemia: Consider IV iron in the future. 3. Cardiac disease: Treatment per cardiology. MUGA scan on March 20, 2016 reported an EF of 52.8%, unchanged. 4. Weight loss: Monitor. 5. Dysphagia: XRT has been completed.  Patient denies any issues swallowing. 6. Weakness and fatigue: Improving. 7. Ankle ulcer: Nonhealing, continue treatment per wound care.  Dressing intact. 8. Peripheral neuropathy: Discontinue Taxol as above. 9. Flushing: Unclear etiology. Possibly delayed reaction to either Taxol or Cyramza. Monitor.  Patient expressed understanding and was in agreement with this plan. He also understands that He can call clinic at any time with any questions, concerns, or complaints.    Lloyd Huger, MD   06/05/2016 11:55 AM

## 2016-06-06 LAB — CEA: CEA: 538.5 ng/mL — AB (ref 0.0–4.7)

## 2016-06-06 LAB — CANCER ANTIGEN 19-9: CA 19-9: 12389 U/mL — ABNORMAL HIGH (ref 0–35)

## 2016-06-08 ENCOUNTER — Encounter: Payer: Medicare Other | Admitting: Surgery

## 2016-06-08 ENCOUNTER — Encounter: Payer: Self-pay | Admitting: Family Medicine

## 2016-06-08 DIAGNOSIS — L97529 Non-pressure chronic ulcer of other part of left foot with unspecified severity: Secondary | ICD-10-CM | POA: Insufficient documentation

## 2016-06-08 DIAGNOSIS — C155 Malignant neoplasm of lower third of esophagus: Secondary | ICD-10-CM | POA: Diagnosis not present

## 2016-06-08 DIAGNOSIS — L89513 Pressure ulcer of right ankle, stage 3: Secondary | ICD-10-CM | POA: Diagnosis not present

## 2016-06-08 DIAGNOSIS — D649 Anemia, unspecified: Secondary | ICD-10-CM | POA: Diagnosis not present

## 2016-06-08 DIAGNOSIS — E11621 Type 2 diabetes mellitus with foot ulcer: Secondary | ICD-10-CM | POA: Diagnosis not present

## 2016-06-08 DIAGNOSIS — L89521 Pressure ulcer of left ankle, stage 1: Secondary | ICD-10-CM | POA: Diagnosis not present

## 2016-06-08 DIAGNOSIS — E441 Mild protein-calorie malnutrition: Secondary | ICD-10-CM | POA: Diagnosis not present

## 2016-06-08 DIAGNOSIS — E119 Type 2 diabetes mellitus without complications: Secondary | ICD-10-CM | POA: Diagnosis not present

## 2016-06-08 DIAGNOSIS — L97519 Non-pressure chronic ulcer of other part of right foot with unspecified severity: Secondary | ICD-10-CM | POA: Diagnosis not present

## 2016-06-08 DIAGNOSIS — L89512 Pressure ulcer of right ankle, stage 2: Secondary | ICD-10-CM | POA: Diagnosis not present

## 2016-06-08 DIAGNOSIS — E039 Hypothyroidism, unspecified: Secondary | ICD-10-CM | POA: Diagnosis not present

## 2016-06-09 ENCOUNTER — Telehealth: Payer: Self-pay | Admitting: *Deleted

## 2016-06-09 MED ORDER — AZITHROMYCIN 250 MG PO TABS: ORAL_TABLET | ORAL | 0 refills | Status: DC

## 2016-06-09 NOTE — Telephone Encounter (Signed)
zpak is fine 

## 2016-06-09 NOTE — Telephone Encounter (Signed)
Called to request a Zpak. Temp is 100, continues to have non productive cough, hoarse of voice.  Has been using Benadryl for cough /allergies. Please advise. She wants to pick up prescription here in [place] to find a pharmacy with the best price. If you are ok with this, let me know and I will get Dr Rogue Bussing to sign for it

## 2016-06-09 NOTE — Progress Notes (Signed)
[name] ([id number]) Visit Report for 06/08/2016 Chief Complaint Document Details Patient Name: [name], [name]. Date of Service: 06/08/2016 3:00 PM Medical Record Number: [id number] Patient Account Number: [id number] Date of Birth/Sex: [birth year] (76 y.o. Male) Treating RN: Baruch Gouty, RN, BSN, Velva Harman Primary Care Provider: Enid Derry Other Clinician: Referring Provider: Enid Derry Treating Provider/Extender: Frann Rider in Treatment: 12 Information Obtained from: Patient Chief Complaint Patient is at the clinic for treatment of an open pressure ulcer to the right lateral ankle which she's had for about 2 months Electronic Signature(s) Signed: 06/08/2016 4:29:54 PM By: Christin Fudge MD, FACS Entered By: Christin Fudge on 06/08/2016 16:29:54 [name] ([id number]) -------------------------------------------------------------------------------- Debridement Details Patient Name: [name] Date of Service: 06/08/2016 3:00 PM Medical Record Number: [id number] Patient Account Number: [id number] Date of Birth/Sex: January 07, 1941 (76 y.o. Male) Treating RN: Afful, RN, BSN, [place] Primary Care Provider: Enid Derry Other Clinician: Referring Provider: Enid Derry Treating Provider/Extender: Frann Rider in Treatment: 12 Debridement Performed for Wound #1 Right,Lateral Malleolus Assessment: Performed By: Physician Christin Fudge, MD Debridement: Debridement Pre-procedure Yes - 15:47 Verification/Time Out Taken: Start Time: 15:47 Pain Control: Lidocaine 5% topical ointment Level: Skin/Subcutaneous Tissue Total Area Debrided (L x 0.9 (cm) x 0.9 (cm) = 0.81 (cm) W): Tissue and other Non-Viable, Exudate, Fat, Fibrin/Slough, Subcutaneous material debrided: Instrument: Curette Bleeding: Minimum Hemostasis Achieved: Pressure End Time: 15:49 Procedural Pain: 0 Post Procedural Pain: 0 Response to Treatment: Procedure was tolerated well Post Debridement  Measurements of Total Wound Length: (cm) 0.9 Stage: Category/Stage III Width: (cm) 0.9 Depth: (cm) 0.3 Volume: (cm) 0.191 Character of Wound/Ulcer Post Requires Further Debridement: Debridement Severity of Tissue Post Fat layer exposed Debridement: Post Procedure Diagnosis Same as Pre-procedure Electronic Signature(s) Signed: 06/08/2016 4:29:44 PM By: Christin Fudge MD, FACS Signed: 06/08/2016 5:08:13 PM By: Regan Lemming BSN, RN Entered By: Christin Fudge on 06/08/2016 16:29:44 ZYHIR, CAPPELLA ([id number], SHELTON ([id number]) -------------------------------------------------------------------------------- HPI Details Patient Name: [name]. Date of Service: 06/08/2016 3:00 PM Medical Record Number: [id number] Patient Account Number: [id number] Date of Birth/Sex: 1940-04-05 (76 y.o. Male) Treating RN: Baruch Gouty, RN, BSN, Velva Harman Primary Care Provider: Enid Derry Other Clinician: Referring Provider: Enid Derry Treating Provider/Extender: Frann Rider in Treatment: 12 History of Present Illness Location: Patient presents with an ulcer on the right lateral ankle. Quality: Patient reports experiencing a dull pain to affected area(s). Severity: Patient states wound are getting worse. Duration: Patient has had the wound for > 2 months prior to seeking treatment at the wound center Timing: Pain in wound is Intermittent (comes and goes Context: The wound appeared gradually over time Modifying Factors: Other treatment(s) tried include:local care as per the medical oncologist Associated Signs and Symptoms: Patient reports having:some pain and no discharge HPI Description: 76 year old patient has been referred by his medical oncologist Dr. Grayland Ormond, for a right lateral malleolus ulcer has been there for several weeks. 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 He is currently on chemotherapy with FOLFOX  and Herceptin. past medical history significant for diabetes mellitus, anemia, hypertension, sleep apnea, status post appendectomy, peripheral vascular catheterization( Port placement) in September 2017 by Dr. Delana Meyer. He is also receiving palliative radiation therapy and was seen by Dr. Donella Stade. 03/17/2016 -- - x-ray of the right ankle -- IMPRESSION: 1. Soft tissue wound noted over the lateral malleolus, no underlying bony abnormality. No acute bony abnormality. 2. Diffuse degenerative change. 3. Peripheral vascular disease. 05/05/2016 --  the patient's wife tells me that she has noticed a small superficial bruise on the left lateral ankle and wanted me to view this area. 05/12/2016 -- the patient is unable to afford Santyl ointment and we have been struggling over the last 8 weeks to use various alternatives. I have asked them today if they would be willing to use the snap vacuum system and she will let me know soon. 05/19/2016 -- the patient and the wife have declined the use of the snap the vacuum system. He does not want any debridement to be done today. His proteins have improved a bit. 06/08/2016 -- . Not seen the patient back for 3 weeks and in the meanwhile the wife has decided to use duoderm on the wound,-- given to her by a friend. The wound is macerated and has increased in size due to the moisture. Electronic Signature(s) Signed: 06/08/2016 4:31:21 PM By: Christin Fudge MD, FACS Previous Signature: 06/08/2016 4:29:58 PM Version By: Christin Fudge MD, FACS Entered By: Christin Fudge on 06/08/2016 16:31:20 [name] ([id number]) -------------------------------------------------------------------------------- Physical Exam Details Patient Name: [name] Date of Service: 06/08/2016 3:00 PM Medical Record Number: [id number] Patient Account Number: [id number] Date of Birth/Sex: 06-Jan-1941 (76 y.o. Male) Treating RN: Baruch Gouty, RN, BSN, Velva Harman Primary Care Provider: Enid Derry Other Clinician: Referring Provider: Enid Derry Treating Provider/Extender: Frann Rider in Treatment: 12 Constitutional . Pulse regular. Respirations normal and unlabored. Afebrile. . Eyes Nonicteric. Reactive to light. Ears, Nose, Mouth, and Throat Lips, teeth, and gums WNL.Marland Kitchen Moist mucosa without lesions. Neck supple and nontender. No palpable supraclavicular or cervical adenopathy. Normal sized without goiter. Respiratory WNL. No retractions.. Breath sounds WNL, No rubs, rales, rhonchi, or wheeze.. Cardiovascular Heart rhythm and rate regular, no murmur or gallop.. Pedal Pulses WNL. No clubbing, cyanosis or edema. Chest Breasts symmetical and no nipple discharge.. Breast tissue WNL, no masses, lumps, or tenderness.. Gastrointestinal (GI) Abdomen without masses or tenderness.. No liver or spleen enlargement or tenderness.. Genitourinary (GU) No hydrocele, spermatocele, tenderness of the cord, or testicular mass.Marland Kitchen Penis without lesions.Lowella Fairy without lesions. No cystocele, or rectocele. Pelvic support intact, no discharge.Marland Kitchen Urethra without masses, tenderness or scarring.Marland Kitchen Lymphatic No adneopathy. No adenopathy. No adenopathy. Musculoskeletal Adexa without tenderness or enlargement.. Digits and nails w/o clubbing, cyanosis, infection, petechiae, ischemia, or inflammatory conditions.. Integumentary (Hair, Skin) No suspicious lesions. No crepitus or fluctuance. No peri-wound warmth or erythema. No masses.Marland Kitchen Psychiatric Judgement and insight Intact.. No evidence of depression, anxiety, or agitation.. Notes wound has taken a turn for the worse with maceration and increase in size and a lot of subcutaneous debris which needed sharp debridement with a #3 curet and bleeding controlled with pressure Electronic Signature(s) JACOBY, ZANNI ([id number]) Signed: 06/08/2016 4:32:48 PM By: Christin Fudge MD, FACS Entered By: Christin Fudge on 06/08/2016 16:32:47 [name] ([id number]) -------------------------------------------------------------------------------- Physician Orders Details Patient Name: [name] Date of Service: 06/08/2016 3:00 PM Medical Record Number: [id number] Patient Account Number: [id number] Date of Birth/Sex: 1941-02-05 (76 y.o. Male) Treating RN: Baruch Gouty, RN, BSN, Velva Harman Primary Care Provider: Enid Derry Other Clinician: Referring Provider: Enid Derry Treating Provider/Extender: Frann Rider in Treatment: 12 Verbal / Phone Orders: No Diagnosis Coding Wound Cleansing Wound #1 Right,Lateral Malleolus o Clean wound with Normal Saline. o Cleanse wound with mild soap and water Wound #2 Right,Dorsal Foot o Clean wound with Normal Saline. o Cleanse wound with mild soap and water Anesthetic Wound #1 Right,Lateral Malleolus o Topical Lidocaine 4% cream applied  to wound bed prior to debridement Wound #2 Right,Dorsal Foot o Topical Lidocaine 4% cream applied to wound bed prior to debridement Skin Barriers/Peri-Wound Care Wound #1 Right,Lateral Malleolus o Antifungal cream - LOTRISONSE.Marland Kitchenapply around wound on reddened areas as needed Wound #2 Right,Dorsal Foot o Antifungal cream - LOTRISONSE.Marland Kitchenapply around wound on reddened areas as needed Primary Wound Dressing Wound #1 Right,Lateral Malleolus o Hydrogel - similar to KY jelly over the counter o Cutimed Sorbact - cut small piece and insert in wound Wound #2 Right,Dorsal Foot o Hydrogel - similar to KY jelly over the counter o Cutimed Sorbact - cut small piece and insert in wound Secondary Dressing Wound #1 Right,Lateral Malleolus o Dry Gauze o Boardered Foam Dressing JAHFARI, AMBERS ([id number]) Wound #2 Right,Dorsal Foot o Dry Gauze o Boardered Foam Dressing Dressing Change Frequency Wound #1 Right,Lateral Malleolus o Change dressing every day. Wound #2 Right,Dorsal Foot o Change dressing every day. Follow-up  Appointments Wound #1 Right,Lateral Malleolus o Return Appointment in 1 week. Wound #2 Right,Dorsal Foot o Return Appointment in 1 week. Edema Control Wound #1 Right,Lateral Malleolus o Elevate legs to the level of the heart and pump ankles as often as possible Wound #2 Right,Dorsal Foot o Elevate legs to the level of the heart and pump ankles as often as possible Off-Loading Wound #1 Right,Lateral Malleolus o Other: - Keep pressure off the malleolus Wound #2 Right,Dorsal Foot o Other: - Keep pressure off the malleolus Additional Orders / Instructions Wound #1 Right,Lateral Malleolus o Increase protein intake. o Activity as tolerated Wound #2 Right,Dorsal Foot o Increase protein intake. o Activity as tolerated Medications-please add to medication list. Wound #1 Right,Lateral Malleolus o Other: - Include these in your diet...VITAMIN A, C, ZINC, MVI AREEB, CORRON ([id number]) Electronic Signature(s) Signed: 06/08/2016 4:50:03 PM By: Christin Fudge MD, FACS Signed: 06/08/2016 5:08:13 PM By: Regan Lemming BSN, RN Entered By: Regan Lemming on 06/08/2016 15:51:29 [name] ([id number]) -------------------------------------------------------------------------------- Problem List Details Patient Name: JALEEL, ALLEN Date of Service: 06/08/2016 3:00 PM Medical Record Number: [id number] Patient Account Number: [id number] Date of Birth/Sex: 04-01-1940 (76 y.o. Male) Treating RN: Baruch Gouty, RN, BSN, Velva Harman Primary Care Provider: Enid Derry Other Clinician: Referring Provider: Enid Derry Treating Provider/Extender: Frann Rider in Treatment: 12 Active Problems ICD-10 Encounter Code Description Active Date Diagnosis E11.621 Type 2 diabetes mellitus with foot ulcer 05/05/2016 Yes L89.512 Pressure ulcer of right ankle, stage 2 05/05/2016 Yes L89.521 Pressure ulcer of left ankle, stage 1 05/05/2016 Yes 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 05/05/2016  Yes Inactive Problems Resolved Problems Electronic Signature(s) Signed: 06/08/2016 4:29:21 PM By: Christin Fudge MD, FACS Entered By: Christin Fudge on 06/08/2016 16:29:20 [name] ([id number]) -------------------------------------------------------------------------------- Progress Note Details Patient Name: [name] Date of Service: 06/08/2016 3:00 PM Medical Record Number: [id number] Patient Account Number: [id number] Date of Birth/Sex: 1940/03/06 (76 y.o. Male) Treating RN: Baruch Gouty, RN, BSN, Velva Harman Primary Care Provider: Enid Derry Other Clinician: Referring Provider: Enid Derry Treating Provider/Extender: Frann Rider in Treatment: 12 Subjective Chief Complaint Information obtained from Patient Patient is at the clinic for treatment of an open pressure ulcer to the right lateral ankle which she's had for about 2 months History of Present Illness (HPI) The following HPI elements were documented for the patient's wound: Location: Patient presents with an ulcer on the right lateral ankle. Quality: Patient reports experiencing a dull pain to affected area(s). Severity: Patient states wound are getting worse. Duration: Patient has had the wound for > 2 months prior  to seeking treatment at the wound center Timing: Pain in wound is Intermittent (comes and goes Context: The wound appeared gradually over time Modifying Factors: Other treatment(s) tried include:local care as per the medical oncologist Associated Signs and Symptoms: Patient reports having:some pain and no discharge 76 year old patient has been referred by his medical oncologist Dr. Grayland Ormond, for a right lateral malleolus ulcer has been there for several weeks. 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 He is currently on chemotherapy with FOLFOX and Herceptin. past medical history significant for diabetes mellitus, anemia, hypertension, sleep  apnea, status post appendectomy, peripheral vascular catheterization( Port placement) in September 2017 by Dr. Delana Meyer. He is also receiving palliative radiation therapy and was seen by Dr. Donella Stade. 03/17/2016 -- - x-ray of the right ankle -- IMPRESSION: 1. Soft tissue wound noted over the lateral malleolus, no underlying bony abnormality. No acute bony abnormality. 2. Diffuse degenerative change. 3. Peripheral vascular disease. 05/05/2016 -- the patient's wife tells me that she has noticed a small superficial bruise on the left lateral ankle and wanted me to view this area. 05/12/2016 -- the patient is unable to afford Santyl ointment and we have been struggling over the last 8 weeks to use various alternatives. I have asked them today if they would be willing to use the snap vacuum system and she will let me know soon. 05/19/2016 -- the patient and the wife have declined the use of the snap the vacuum system. He does not want any debridement to be done today. His proteins have improved a bit. 06/08/2016 -- . Not seen the patient back for 3 weeks and in the meanwhile the wife has decided to use duoderm on the wound,-- given to her by a friend. The wound is macerated and has increased in size due to the moisture. LARNCE, SCHNACKENBERG ([id number]) Allergies sufa drugs, Cephalosporins, lisinopril, losartin, Keflex, amoxicillin, ampicillin, Septra, Bactrim Objective Constitutional Pulse regular. Respirations normal and unlabored. Afebrile. Vitals Time Taken: 3:09 PM, Height: 67 in, Weight: 136 lbs, BMI: 21.3, Temperature: 98.1 F, Pulse: 83 bpm, Respiratory Rate: 16 breaths/min, Blood Pressure: 134/49 mmHg. Eyes Nonicteric. Reactive to light. Ears, Nose, Mouth, and Throat Lips, teeth, and gums WNL.Marland Kitchen Moist mucosa without lesions. Neck supple and nontender. No palpable supraclavicular or cervical adenopathy. Normal sized without goiter. Respiratory WNL. No retractions.. Breath sounds WNL, No  rubs, rales, rhonchi, or wheeze.. Cardiovascular Heart rhythm and rate regular, no murmur or gallop.. Pedal Pulses WNL. No clubbing, cyanosis or edema. Chest Breasts symmetical and no nipple discharge.. Breast tissue WNL, no masses, lumps, or tenderness.. Gastrointestinal (GI) Abdomen without masses or tenderness.. No liver or spleen enlargement or tenderness.. Genitourinary (GU) No hydrocele, spermatocele, tenderness of the cord, or testicular mass.Marland Kitchen Penis without lesions.Lowella Fairy without lesions. No cystocele, or rectocele. Pelvic support intact, no discharge.Marland Kitchen Urethra without masses, tenderness or scarring.Marland Kitchen Lymphatic No adneopathy. No adenopathy. No adenopathy. Musculoskeletal Adexa without tenderness or enlargement.. Digits and nails w/o clubbing, cyanosis, infection, petechiae, BRAVEN, WOLK. ([id number]) ischemia, or inflammatory conditions.Marland Kitchen Psychiatric Judgement and insight Intact.. No evidence of depression, anxiety, or agitation.. General Notes: wound has taken a turn for the worse with maceration and increase in size and a lot of subcutaneous debris which needed sharp debridement with a #3 curet and bleeding controlled with pressure Integumentary (Hair, Skin) No suspicious lesions. No crepitus or fluctuance. No peri-wound warmth or erythema. No masses.. Wound #1 status is Open. Original cause of wound  was Gradually Appeared. The wound is located on the Right,Lateral Malleolus. The wound measures 0.9cm length x 0.9cm width x 0.3cm depth; 0.636cm^2 area and 0.191cm^3 volume. There is Fat Layer (Subcutaneous Tissue) Exposed exposed. There is no tunneling or undermining noted. There is a medium amount of serous drainage noted. The wound margin is flat and intact. There is no granulation within the wound bed. There is a large (67-100%) amount of necrotic tissue within the wound bed including Adherent Slough. The periwound skin appearance exhibited: Induration, Maceration,  Erythema. The periwound skin appearance did not exhibit: Dry/Scaly. The surrounding wound skin color is noted with erythema which is circumferential. Periwound temperature was noted as No Abnormality. The periwound has tenderness on palpation. Wound #2 status is Open. Original cause of wound was Gradually Appeared. The wound is located on the Right,Dorsal Foot. The wound measures 0.5cm length x 0.5cm width x 0.2cm depth; 0.196cm^2 area and 0.039cm^3 volume. There is Fat Layer (Subcutaneous Tissue) Exposed exposed. There is no tunneling or undermining noted. There is a medium amount of serosanguineous drainage noted. The wound margin is flat and intact. There is no granulation within the wound bed. There is a large (67-100%) amount of necrotic tissue within the wound bed. The periwound skin appearance did not exhibit: Callus, Crepitus, Excoriation, Induration, Rash, Scarring, Dry/Scaly, Maceration, Atrophie Blanche, Cyanosis, Ecchymosis, Hemosiderin Staining, Mottled, Pallor, Rubor, Erythema. Periwound temperature was noted as No Abnormality. The periwound has tenderness on palpation. Assessment Active Problems ICD-10 E11.621 - Type 2 diabetes mellitus with foot ulcer L89.512 - Pressure ulcer of right ankle, stage 2 L89.521 - Pressure ulcer of left ankle, stage 1 E44.1 - Mild protein-calorie malnutrition OLANDA, DOWNIE ([id number]) Procedures Wound #1 Wound #1 is a Pressure Ulcer located on the Right,Lateral Malleolus . There was a Skin/Subcutaneous Tissue Debridement ([id number]) debridement with total area of 0.81 sq cm performed by Christin Fudge, MD. with the following instrument(s): Curette to remove Non-Viable tissue/material including Exudate, Fat Layer (and Subcutaneous Tissue) Exposed, Fibrin/Slough, and Subcutaneous after achieving pain control using Lidocaine 5% topical ointment. A time out was conducted at 15:47, prior to the start of the procedure. A Minimum amount of bleeding  was controlled with Pressure. The procedure was tolerated well with a pain level of 0 throughout and a pain level of 0 following the procedure. Post Debridement Measurements: 0.9cm length x 0.9cm width x 0.3cm depth; 0.191cm^3 volume. Post debridement Stage noted as Category/Stage III. Character of Wound/Ulcer Post Debridement requires further debridement. Severity of Tissue Post Debridement is: Fat layer exposed. Post procedure Diagnosis Wound #1: Same as Pre-Procedure Plan Wound Cleansing: Wound #1 Right,Lateral Malleolus: Clean wound with Normal Saline. Cleanse wound with mild soap and water Wound #2 Right,Dorsal Foot: Clean wound with Normal Saline. Cleanse wound with mild soap and water Anesthetic: Wound #1 Right,Lateral Malleolus: Topical Lidocaine 4% cream applied to wound bed prior to debridement Wound #2 Right,Dorsal Foot: Topical Lidocaine 4% cream applied to wound bed prior to debridement Skin Barriers/Peri-Wound Care: Wound #1 Right,Lateral Malleolus: Antifungal cream - LOTRISONSE.Marland Kitchenapply around wound on reddened areas as needed Wound #2 Right,Dorsal Foot: Antifungal cream - LOTRISONSE.Marland Kitchenapply around wound on reddened areas as needed Primary Wound Dressing: Wound #1 Right,Lateral Malleolus: Hydrogel - similar to KY jelly over the counter Cutimed Sorbact - cut small piece and insert in wound Wound #2 Right,Dorsal Foot: Hydrogel - similar to KY jelly over the counter Cutimed Sorbact - cut small piece and insert in wound Secondary Dressing: Wound #1 Right,Lateral Malleolus: Christoper Fabian  G. ([id number]) Dry Gauze Boardered Foam Dressing Wound #2 Right,Dorsal Foot: Dry Gauze Boardered Foam Dressing Dressing Change Frequency: Wound #1 Right,Lateral Malleolus: Change dressing every day. Wound #2 Right,Dorsal Foot: Change dressing every day. Follow-up Appointments: Wound #1 Right,Lateral Malleolus: Return Appointment in 1 week. Wound #2 Right,Dorsal Foot: Return  Appointment in 1 week. Edema Control: Wound #1 Right,Lateral Malleolus: Elevate legs to the level of the heart and pump ankles as often as possible Wound #2 Right,Dorsal Foot: Elevate legs to the level of the heart and pump ankles as often as possible Off-Loading: Wound #1 Right,Lateral Malleolus: Other: - Keep pressure off the malleolus Wound #2 Right,Dorsal Foot: Other: - Keep pressure off the malleolus Additional Orders / Instructions: Wound #1 Right,Lateral Malleolus: Increase protein intake. Activity as tolerated Wound #2 Right,Dorsal Foot: Increase protein intake. Activity as tolerated Medications-please add to medication list.: Wound #1 Right,Lateral Malleolus: Other: - Include these in your diet...VITAMIN A, C, ZINC, MVI the patient's wife is rather difficult and fails to follow advice which is best for the patient's needs. We have never been able to use Santyl for him because of cost and a few weeks ago I had recommended a snap vacuum system which she declined. We will consider him complex care and do the best we can within the constraints of my treatment options. I have recommended: 1. Daily dressing with hydrogel and Sorbact. 2. generic Lotrisone to be applied around the wound 3. Offloading has been discussed as much as possible in great detail -- specially regarding the area on the left lateral ankle ANAND, TEJADA. ([id number]) 4. Adequate protein, vitamin A, vitamin C and zinc. 5. regular visits to the wound center. the patient's wife has had all questions answered and will be compliant Electronic Signature(s) Signed: 06/08/2016 4:35:05 PM By: Christin Fudge MD, FACS Entered By: Christin Fudge on 06/08/2016 16:35:05 [name] ([id number]) -------------------------------------------------------------------------------- [place] Details Patient Name: [name] Date of Service: 06/08/2016 Medical Record Number: [id number] Patient Account Number:  [id number] Date of Birth/Sex: 1940-03-21 (76 y.o. Male) Treating RN: Baruch Gouty, RN, BSN, [place] Primary Care Provider: Enid Derry Other Clinician: Referring Provider: Enid Derry Treating Provider/Extender: Frann Rider in Treatment: 12 Diagnosis Coding ICD-10 Codes Code Description E11.621 Type 2 diabetes mellitus with foot ulcer L89.512 Pressure ulcer of right ankle, stage 2 L89.521 Pressure ulcer of left ankle, stage 1 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 Facility Procedures CPT4 Code: [id number] Description: 49675 - DEB SUBQ TISSUE 20 SQ CM/< ICD-10 Description Diagnosis E11.621 Type 2 diabetes mellitus with foot ulcer L89.512 Pressure ulcer of right ankle, stage 2 L89.521 Pressure ulcer of left ankle, stage 1 E44.1 Mild protein-calorie  malnutrition Modifier: Quantity: 1 Physician Procedures CPT4 Code: 9163846 Description: 65993 - WC PHYS SUBQ TISS 20 SQ CM ICD-10 Description Diagnosis E11.621 Type 2 diabetes mellitus with foot ulcer L89.512 Pressure ulcer of right ankle, stage 2 L89.521 Pressure ulcer of left ankle, stage 1 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

## 2016-06-09 NOTE — Progress Notes (Addendum)
**Note Jeremiah-Identified via Obfuscation** ICARUS, PARTCH ([id number]) Visit Report for 06/08/2016 Allergy List Details Patient Name: [name]. Date of Service: 06/08/2016 3:00 PM Medical Record Number: [id number] Patient Account Number: [id number] Date of Birth/Sex: [birth year] (76 y.o. Male) Treating RN: Baruch Gouty, RN, BSN, [facility] Primary Care Charlie Seda: Enid Derry Other Clinician: Referring Naveen Lorusso: Enid Derry Treating Ylonda Storr/Extender: Frann Rider in Treatment: 12 Allergies Active Allergies sufa drugs Cephalosporins lisinopril losartin Keflex amoxicillin ampicillin Septra Bactrim Allergy Notes Electronic Signature(s) Signed: 06/08/2016 3:07:20 PM By: Regan Lemming BSN, RN Entered By: Regan Lemming on 06/08/2016 15:07:20 [name] ([id number]) -------------------------------------------------------------------------------- Arrival Information Details Patient Name: [name] Date of Service: 06/08/2016 3:00 PM Medical Record Number: [id number] Patient Account Number: [id number] Date of Birth/Sex: [birth year] (76 y.o. Male) Treating RN: Baruch Gouty, RN, BSN, [place] Primary Care Oak Dorey: Enid Derry Other Clinician: Referring Khoen Genet: Enid Derry Treating Shatara Stanek/Extender: Frann Rider in Treatment: 12 Visit Information Patient Arrived: Ambulatory Arrival Time: 15:05 Accompanied By: wife Transfer Assistance: None Patient Identification Verified: Yes Secondary Verification Process Yes Completed: Patient Requires Transmission- No Based Precautions: Patient Has Alerts: Yes Patient Alerts: Patient on Blood Thinner DM II 81mg  aspirin twice daily History Since Last Visit All ordered tests and consults were completed: No Added or deleted any medications: No Any new allergies or adverse reactions: No Had a fall or experienced change in activities of daily living that may affect risk of falls: No Signs or symptoms of abuse/neglect since last visito No Hospitalized since last visit:  No Has Dressing in Place as Prescribed: Yes Electronic Signature(s) Signed: 06/08/2016 3:05:58 PM By: Regan Lemming BSN, RN Entered By: Regan Lemming on 06/08/2016 15:05:58 [name] ([id number]) -------------------------------------------------------------------------------- Encounter Discharge Information Details Patient Name: [name] Date of Service: 06/08/2016 3:00 PM Medical Record Number: [id number] Patient Account Number: [id number] Date of Birth/Sex: 12/24/1940 (76 y.o. Male) Treating RN: Baruch Gouty, RN, BSN, [facility] Primary Care Jsoeph Podesta: Enid Derry Other Clinician: Referring Tyee Vandevoorde: Enid Derry Treating Jackline Castilla/Extender: Frann Rider in Treatment: 12 Encounter Discharge Information Items Discharge Pain Level: 0 Discharge Condition: Stable Ambulatory Status: Cane Discharge Destination: Home Transportation: Private Auto Accompanied By: wife Schedule Follow-up Appointment: No Medication Reconciliation completed No and provided to Patient/Care Aki Burdin: Provided on Clinical Summary of Care: 06/08/2016 Form Type Recipient Paper Patient FW Electronic Signature(s) Signed: 06/08/2016 5:08:13 PM By: Regan Lemming BSN, RN Previous Signature: 06/08/2016 3:57:25 PM Version By: Ruthine Dose Entered By: Regan Lemming on 06/08/2016 16:08:01 [name] ([id number]) -------------------------------------------------------------------------------- Lower Extremity Assessment Details Patient Name: [name] Date of Service: 06/08/2016 3:00 PM Medical Record Number: [id number] Patient Account Number: [id number] Date of Birth/Sex: [birth year] (76 y.o. Male) Treating RN: Baruch Gouty, RN, BSN, [facility] Harman Primary Care Vela Render: Enid Derry Other Clinician: Referring Pailynn Vahey: Enid Derry Treating Jaxan Michel/Extender: Frann Rider in Treatment: 12 Edema Assessment Assessed: [Left: No] [Right: No] Edema: [Left: N] [Right: o] Vascular  Assessment Claudication: Claudication Assessment [Right:None] Pulses: Dorsalis Pedis Palpable: [Right:Yes] Posterior Tibial Extremity colors, hair growth, and conditions: Extremity Color: [Right:Normal] Hair Growth on Extremity: [Right:No] Temperature of Extremity: [Right:Warm] Capillary Refill: [Right:< 3 seconds] Electronic Signature(s) Signed: 06/08/2016 3:06:42 PM By: Regan Lemming BSN, RN Entered By: Regan Lemming on 06/08/2016 15:06:42 [name] ([id number]) -------------------------------------------------------------------------------- Multi Wound Chart Details Patient Name: [name] Date of Service: 06/08/2016 3:00 PM Medical Record Number: [id number] Patient Account Number: [id number] Date of Birth/Sex: December 11, 1940 (76 y.o. Male) Treating RN: Baruch Gouty, RN, BSN, [facility] Harman Primary Care Bonner Larue: Enid Derry Other Clinician: Referring Silverio Hagan: Enid Derry Treating Etoy Mcdonnell/Extender: Con Memos,  Errol Weeks in Treatment: 12 Vital Signs Height(in): 67 Pulse(bpm): 83 Weight(lbs): 136 Blood Pressure 134/49 (mmHg): Body Mass Index(BMI): 21 Temperature(F): 98.1 Respiratory Rate 16 (breaths/min): Photos: [N/A:N/A] Wound Location: Right Malleolus - Lateral Right Foot - Dorsal N/A Wounding Event: Gradually Appeared Gradually Appeared N/A Primary Etiology: Pressure Ulcer Diabetic Wound/Ulcer of N/A the Lower Extremity Secondary Etiology: 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 Comorbid History: Cataracts, Chronic sinus Cataracts, Chronic sinus N/A problems/congestion, problems/congestion, Anemia, Sleep Apnea, Anemia, Sleep Apnea, Hypertension, Myocardial Hypertension, Myocardial Infarction, Type II Infarction, Type II Diabetes, Received Diabetes, Received Chemotherapy, Received Chemotherapy, Received Radiation Radiation Date Acquired: 01/09/2016 06/02/2016 N/A Weeks of Treatment: 12 0 N/A Wound Status: Open Open N/A 0.9x0.9x0.3 0.5x0.5x0.2 N/A [name], [name] ([id number]) Measurements L x W x D (cm) Area (cm) : 0.636 0.196 N/A Volume (cm) : 0.191 0.039 N/A % Reduction in Area: -169.50% N/A N/A % Reduction in Volume: -695.80% N/A N/A Classification: Category/Stage III Grade 1 N/A HBO Classification: Grade 1 N/A N/A Exudate Amount: Medium Medium N/A Exudate Type: Serous Serosanguineous N/A Exudate Color: amber red, brown N/A Wound Margin: Flat and Intact Flat and Intact N/A Granulation Amount: None Present (0%) None Present (0%) N/A Necrotic Amount: Large (67-100%) Large (67-100%) N/A Exposed Structures: Fat Layer (Subcutaneous Fat Layer (Subcutaneous N/A Tissue) Exposed: Yes Tissue) Exposed: Yes Fascia: No Fascia: No Tendon: No Tendon: No Muscle: No Muscle: No Joint: No Joint: No Bone: No Bone: No Epithelialization: None None N/A Debridement: Debridement (93810- N/A N/A 11047) Pre-procedure 15:47 N/A N/A Verification/Time Out Taken: Pain Control: Lidocaine 5% topical N/A N/A ointment Tissue Debrided: Fibrin/Slough, Fat, N/A N/A Exudates, Subcutaneous Level: Skin/Subcutaneous N/A N/A Tissue Debridement Area (sq 0.81 N/A N/A cm): Instrument: Curette N/A N/A Bleeding: Minimum N/A N/A Hemostasis Achieved: Pressure N/A N/A Procedural Pain: 0 N/A N/A Post Procedural Pain: 0 N/A N/A Debridement Treatment Procedure was tolerated N/A N/A Response: well Post Debridement 0.9x0.9x0.3 N/A N/A Measurements L x W x D (cm) Post Debridement 0.191 N/A N/A Volume: (cm) Post Debridement Category/Stage III N/A N/A Stage: [name], [name] ([id number]) Periwound Skin Texture: Induration: Yes Excoriation: No N/A Induration: No Callus: No Crepitus: No Rash: No Scarring: No Periwound Skin Maceration: Yes Maceration: No N/A Moisture: Dry/Scaly: No Dry/Scaly: No Periwound Skin Color: Erythema: Yes Atrophie Blanche: No N/A Cyanosis: No Ecchymosis: No Erythema: No Hemosiderin Staining: No Mottled: No Pallor:  No Rubor: No Erythema Location: Circumferential N/A N/A Erythema Change: Decreased N/A N/A Temperature: No Abnormality No Abnormality N/A Tenderness on Yes Yes N/A Palpation: Wound Preparation: Ulcer Cleansing: Ulcer Cleansing: N/A Rinsed/Irrigated with Rinsed/Irrigated with Saline Saline Topical Anesthetic Topical Anesthetic Applied: Other: lidociane Applied: Other: lidocaine 4% 4% Procedures Performed: Debridement N/A N/A Treatment Notes Wound #1 (Right, Lateral Malleolus) 1. Cleansed with: Clean wound with Normal Saline 4. Dressing Applied: Other dressing (specify in notes) 5. Secondary Dressing Applied Bordered Foam Dressing Dry Gauze Notes cutimed sorbact Wound #2 (Right, Dorsal Foot) 1. Cleansed with: Clean wound with Normal Saline 4. Dressing Applied: Other dressing (specify in notes) 5. Secondary Dressing Applied [name], [name] ([id number]) Bordered Foam Dressing Dry Gauze Notes cutimed sorbact Electronic Signature(s) Signed: 06/08/2016 4:29:28 PM By: Christin Fudge MD, FACS Entered By: Christin Fudge on 06/08/2016 16:29:27 [name] ([id number]) -------------------------------------------------------------------------------- [place] Details Patient Name: [name], [name] Date of Service: 06/08/2016 3:00 PM Medical Record Number: [id number] Patient Account Number: [id number] Date of Birth/Sex: 08-02-1940 (76 y.o. Male) Treating RN: Baruch Gouty, RN, BSN, [facility] Primary Care Austin Herd: Sanda Klein,  Rip Harbour Other Clinician: Referring Gracin Mcpartland: Enid Derry Treating Raylei Losurdo/Extender: Frann Rider in Treatment: 12 Active Inactive ` Orientation to the Wound Care Program Nursing Diagnoses: Knowledge deficit related to the wound healing center program Goals: Patient/caregiver will verbalize understanding of the [facility] Program Date Initiated: 03/10/2016 Target Resolution Date: 03/23/2016 Goal Status:  Active Interventions: Provide education on orientation to the wound center Notes: ` Soft Tissue Infection Nursing Diagnoses: Impaired tissue integrity Potential for infection: soft tissue Goals: Patient will remain free of wound infection Date Initiated: 03/10/2016 Target Resolution Date: 03/17/2016 Goal Status: Active Interventions: Assess signs and symptoms of infection every visit Notes: ` Wound/Skin Impairment Nursing Diagnoses: Impaired tissue integrity [name], [name] ([id number]) Goals: Ulcer/skin breakdown will heal within 14 weeks Date Initiated: 03/10/2016 Target Resolution Date: 03/24/2016 Goal Status: Active Interventions: Assess patient/caregiver ability to obtain necessary supplies Notes: Electronic Signature(s) Signed: 06/08/2016 5:08:13 PM By: Regan Lemming BSN, RN Entered By: Regan Lemming on 06/08/2016 15:17:42 [name] ([id number]) -------------------------------------------------------------------------------- Pain Assessment Details Patient Name: [name] Date of Service: 06/08/2016 3:00 PM Medical Record Number: [id number] Patient Account Number: [id number] Date of Birth/Sex: [birth year] (76 y.o. Male) Treating RN: Baruch Gouty, RN, BSN, [facility] Harman Primary Care Fortunato Nordin: Enid Derry Other Clinician: Referring Elynn Patteson: Enid Derry Treating Simranjit Thayer/Extender: Frann Rider in Treatment: 12 Active Problems Location of Pain Severity and Description of Pain Patient Has Paino Yes Site Locations Pain Location: Pain in Ulcers Rate the pain. Current Pain Level: 4 Character of Pain Describe the Pain: Tender Pain Management and Medication Current Pain Management: Electronic Signature(s) Signed: 06/08/2016 3:06:13 PM By: Regan Lemming BSN, RN Entered By: Regan Lemming on 06/08/2016 15:06:13 [name] ([id number]) -------------------------------------------------------------------------------- Patient/Caregiver Education Details Patient Name:  [name] Date of Service: 06/08/2016 3:00 PM Medical Record Number: [id number] Patient Account Number: [id number] Date of Birth/Gender: 26-Dec-1940 (76 y.o. Male) Treating RN: Baruch Gouty, RN, BSN, [facility] Harman Primary Care Physician: Enid Derry Other Clinician: Referring Physician: Enid Derry Treating Physician/Extender: Frann Rider in Treatment: 12 Education Assessment Education Provided To: Patient and Caregiver Education Topics Provided Welcome To The [place]: Methods: Explain/Verbal Responses: State content correctly Wound Debridement: Methods: Explain/Verbal Responses: State content correctly Wound/Skin Impairment: Methods: Explain/Verbal Responses: State content correctly Electronic Signature(s) Signed: 06/08/2016 5:08:13 PM By: Regan Lemming BSN, RN Entered By: Regan Lemming on 06/08/2016 16:08:18 [name] ([id number]) -------------------------------------------------------------------------------- Wound Assessment Details Patient Name: [name] Date of Service: 06/08/2016 3:00 PM Medical Record Number: [id number] Patient Account Number: [id number] Date of Birth/Sex: 1940-03-09 (76 y.o. Male) Treating RN: Baruch Gouty, RN, BSN, [place] Primary Care Karrah Mangini: Enid Derry Other Clinician: Referring Hisao Doo: Enid Derry Treating Talonda Artist/Extender: Frann Rider in Treatment: 12 Wound Status Wound Number: 1 Primary Pressure Ulcer Etiology: Wound Location: Right Malleolus - Lateral Secondary Diabetic Wound/Ulcer of the Lower Wounding Event: Gradually Appeared Etiology: Extremity Date Acquired: 01/09/2016 Wound Open Weeks Of Treatment: 12 Status: Clustered Wound: No Comorbid Cataracts, Chronic sinus History: problems/congestion, Anemia, Sleep Apnea, Hypertension, Myocardial Infarction, Type II Diabetes, Received Chemotherapy, Received Radiation Photos Photo Uploaded By: Regan Lemming on 06/08/2016 15:42:47 Wound Measurements Length:  (cm) 0.9 Width: (cm) 0.9 Depth: (cm) 0.3 Area: (cm) 0.636 Volume: (cm) 0.191 % Reduction in Area: -169.5% % Reduction in Volume: -695.8% Epithelialization: None Tunneling: No Undermining: No Wound Description 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 Severity Earleen Newport): Grade 1 Slough/Fibrin Wound Margin: Flat and Intact Exudate Amount: Medium Exudate Type: Serous Exudate Color: amber [name], [name] ([id number]) er Cleansing: No o Yes  Wound Bed Granulation Amount: None Present (0%) Exposed Structure Necrotic Amount: Large (67-100%) Fascia Exposed: No Necrotic Quality: Adherent Slough Fat Layer (Subcutaneous Tissue) Exposed: Yes Tendon Exposed: No Muscle Exposed: No Joint Exposed: No Bone Exposed: No Periwound Skin Texture Texture Color No Abnormalities Noted: No No Abnormalities Noted: No Induration: Yes Erythema: Yes Erythema Location: Circumferential Moisture Erythema Change: Decreased No Abnormalities Noted: No Dry / Scaly: No Temperature / Pain Maceration: Yes Temperature: No Abnormality Tenderness on Palpation: Yes Wound Preparation Ulcer Cleansing: Rinsed/Irrigated with Saline Topical Anesthetic Applied: Other: lidociane 4%, Treatment Notes Wound #1 (Right, Lateral Malleolus) 1. Cleansed with: Clean wound with Normal Saline 4. Dressing Applied: Other dressing (specify in notes) 5. Secondary Dressing Applied Bordered Foam Dressing Dry Gauze Notes cutimed sorbact Electronic Signature(s) Signed: 06/08/2016 5:08:13 PM By: Regan Lemming BSN, RN Entered By: Regan Lemming on 06/08/2016 15:14:53 [name] ([id number]) -------------------------------------------------------------------------------- Wound Assessment Details Patient Name: [name] Date of Service: 06/08/2016 3:00 PM Medical Record Number: [id number] Patient Account Number: [id number] Date of Birth/Sex: 1940/04/21 (76 y.o. Male) Treating RN: Baruch Gouty, RN, BSN,  [place] Primary Care Lajada Janes: Enid Derry Other Clinician: Referring Radin Raptis: Enid Derry Treating Rameses Ou/Extender: Frann Rider in Treatment: 12 Wound Status Wound Number: 2 Primary Diabetic Wound/Ulcer of the Lower Etiology: Extremity Wound Location: Right Foot - Dorsal Wound Open Wounding Event: Gradually Appeared Status: Date Acquired: 06/02/2016 Comorbid Cataracts, Chronic sinus Weeks Of Treatment: 0 History: problems/congestion, Anemia, Sleep Clustered Wound: No Apnea, Hypertension, Myocardial Infarction, Type II Diabetes, Received Chemotherapy, Received Radiation Photos Photo Uploaded By: Regan Lemming on 06/08/2016 15:42:48 Wound Measurements Length: (cm) 0.5 % Reduction in Width: (cm) 0.5 % Reduction in Depth: (cm) 0.2 Epithelializat Area: (cm) 0.196 Tunneling: Volume: (cm) 0.039 Undermining: Area: Volume: ion: None No No Wound Description Classification: Grade 1 Foul Odor Aft Wound Margin: Flat and Intact Slough/Fibrin ISABELLA, IDA ([id number]) er Cleansing: No o Yes Exudate Amount: Medium Exudate Type: Serosanguineous Exudate Color: red, brown Wound Bed Granulation Amount: None Present (0%) Exposed Structure Necrotic Amount: Large (67-100%) Fascia Exposed: No Fat Layer (Subcutaneous Tissue) Exposed: Yes Tendon Exposed: No Muscle Exposed: No Joint Exposed: No Bone Exposed: No Periwound Skin Texture Texture Color No Abnormalities Noted: No No Abnormalities Noted: No Callus: No Atrophie Blanche: No Crepitus: No Cyanosis: No Excoriation: No Ecchymosis: No Induration: No Erythema: No Rash: No Hemosiderin Staining: No Scarring: No Mottled: No Pallor: No Moisture Rubor: No No Abnormalities Noted: No Dry / Scaly: No Temperature / Pain Maceration: No Temperature: No Abnormality Tenderness on Palpation: Yes Wound Preparation Ulcer Cleansing: Rinsed/Irrigated with Saline Topical Anesthetic Applied: Other: lidocaine  4%, Treatment Notes Wound #2 (Right, Dorsal Foot) 1. Cleansed with: Clean wound with Normal Saline 4. Dressing Applied: Other dressing (specify in notes) 5. Secondary Dressing Applied Bordered Foam Dressing Dry Gauze Notes cutimed sorbact Electronic Signature(s) Signed: 06/08/2016 5:08:13 PM By: Regan Lemming BSN, RN [name], [name] ([id number]) Entered By: Regan Lemming on 06/08/2016 15:17:27 [name] ([id number]) -------------------------------------------------------------------------------- [place] Details Patient Name: [name] Date of Service: 06/08/2016 3:00 PM Medical Record Number: [id number] Patient Account Number: [id number] Date of Birth/Sex: [birth year] (76 y.o. Male) Treating RN: Baruch Gouty, RN, BSN, [place] Primary Care Deira Shimer: Enid Derry Other Clinician: Referring Rukiya Hodgkins: Enid Derry Treating Ashia Dehner/Extender: Frann Rider in Treatment: 12 Vital Signs Time Taken: 15:09 Temperature (F): 98.1 Height (in): 67 Pulse (bpm): 83 Weight (lbs): 136 Respiratory Rate (breaths/min): 16 Body Mass Index (BMI): 21.3 Blood Pressure (mmHg): 134/49 Reference Range: 80 - 120 mg /  dl Electronic Signature(s) Signed: 06/08/2016 5:08:13 PM By: Regan Lemming BSN, RN Entered By: Regan Lemming on 06/08/2016 15:11:14

## 2016-06-15 DIAGNOSIS — B351 Tinea unguium: Secondary | ICD-10-CM | POA: Diagnosis not present

## 2016-06-15 DIAGNOSIS — E1142 Type 2 diabetes mellitus with diabetic polyneuropathy: Secondary | ICD-10-CM | POA: Diagnosis not present

## 2016-06-15 DIAGNOSIS — L97509 Non-pressure chronic ulcer of other part of unspecified foot with unspecified severity: Secondary | ICD-10-CM | POA: Diagnosis not present

## 2016-06-15 DIAGNOSIS — L97511 Non-pressure chronic ulcer of other part of right foot limited to breakdown of skin: Secondary | ICD-10-CM | POA: Diagnosis not present

## 2016-06-15 DIAGNOSIS — E11621 Type 2 diabetes mellitus with foot ulcer: Secondary | ICD-10-CM | POA: Diagnosis not present

## 2016-06-16 ENCOUNTER — Encounter: Payer: Medicare Other | Attending: Surgery | Admitting: Surgery

## 2016-06-16 ENCOUNTER — Other Ambulatory Visit: Payer: Self-pay

## 2016-06-16 ENCOUNTER — Telehealth: Payer: Self-pay | Admitting: Cardiovascular Disease

## 2016-06-16 DIAGNOSIS — L89521 Pressure ulcer of left ankle, stage 1: Secondary | ICD-10-CM | POA: Diagnosis not present

## 2016-06-16 DIAGNOSIS — C155 Malignant neoplasm of lower third of esophagus: Secondary | ICD-10-CM | POA: Diagnosis not present

## 2016-06-16 DIAGNOSIS — E11621 Type 2 diabetes mellitus with foot ulcer: Secondary | ICD-10-CM | POA: Insufficient documentation

## 2016-06-16 DIAGNOSIS — E441 Mild protein-calorie malnutrition: Secondary | ICD-10-CM | POA: Insufficient documentation

## 2016-06-16 DIAGNOSIS — D649 Anemia, unspecified: Secondary | ICD-10-CM | POA: Diagnosis not present

## 2016-06-16 DIAGNOSIS — L89513 Pressure ulcer of right ankle, stage 3: Secondary | ICD-10-CM | POA: Diagnosis not present

## 2016-06-16 DIAGNOSIS — L89512 Pressure ulcer of right ankle, stage 2: Secondary | ICD-10-CM | POA: Insufficient documentation

## 2016-06-16 DIAGNOSIS — I10 Essential (primary) hypertension: Secondary | ICD-10-CM | POA: Insufficient documentation

## 2016-06-16 DIAGNOSIS — G4730 Sleep apnea, unspecified: Secondary | ICD-10-CM | POA: Diagnosis not present

## 2016-06-16 DIAGNOSIS — Z880 Allergy status to penicillin: Secondary | ICD-10-CM | POA: Insufficient documentation

## 2016-06-16 DIAGNOSIS — S91301A Unspecified open wound, right foot, initial encounter: Secondary | ICD-10-CM | POA: Diagnosis not present

## 2016-06-16 DIAGNOSIS — Z882 Allergy status to sulfonamides status: Secondary | ICD-10-CM | POA: Diagnosis not present

## 2016-06-16 MED ORDER — ISOSORBIDE MONONITRATE ER 60 MG PO TB24: 60.0000 mg | ORAL_TABLET | Freq: Every day | ORAL | 0 refills | Status: DC

## 2016-06-16 NOTE — Telephone Encounter (Signed)
Imdur prescription is from August 2016. Pt saw Dr. Fletcher Anon once in July 2017. Needs 6 month f/u before refills.  S/w wife who reports pt had Imdur refills from Dr. Saralyn Pilar through last month as he is a former patient of his. Pt did not f/u in January as he was not well and wife cancelled all appointments. Advised pt I will need to s/w Dr. Fletcher Anon before refilling  Wife states "Well he better refill them because if he has pain, it's going to be on him". Pt scheduled May 22 appt. Submitted one refill to [facility], [place] Pt wife aware.

## 2016-06-16 NOTE — Telephone Encounter (Signed)
Is this okay to refill since Dr. Fletcher Anon did not prescribe it?

## 2016-06-16 NOTE — Telephone Encounter (Signed)
°*  STAT* If patient is at the pharmacy, call can be transferred to refill team.   1. Which medications need to be refilled? (please list name of each medication and dose if known)  Imdur   2. Which pharmacy/location (including street and city if local pharmacy) is medication to be sent to?[facility] in [place]   3. Do they need a 30 day or 90 day supply? 90 day

## 2016-06-17 NOTE — Progress Notes (Addendum)
REFOEL, PALLADINO ([id number]) Visit Report for 06/16/2016 Allergy List Details Patient Name: [name], [name]. Date of Service: 06/16/2016 11:15 AM Medical Record Number: [id number] Patient Account Number: [id number] Date of Birth/Sex: [birth year] (76 y.o. Male) Treating RN: Baruch Gouty, RN, BSN, Velva Harman Primary Care Loukisha Gunnerson: En[place] Derry Other Clinician: Referring Tannor Pyon: [place] Derry Treating Jaxin Fulfer/Extender: Frann Rider in Treatment: 14 Allergies Active Allergies sufa drugs Cephalosporins lisinopril losartin Keflex amoxicillin ampicillin Septra Bactrim Allergy Notes Electronic Signature(s) Signed: 06/16/2016 11:10:38 AM By: Regan Lemming BSN, RN Entered By: Regan Lemming on 06/16/2016 11:10:38 [name] ([id number]) -------------------------------------------------------------------------------- [place] Details Patient Name: [name] Date of Service: 06/16/2016 11:15 AM Medical Record Number: [id number] Patient Account Number: [id number] Date of Birth/Sex: 09/08/1940 (76 y.o. Male) Treating RN: Baruch Gouty, RN, BSN, [place] Primary Care Darlin Stenseth: Enid Derry Other Clinician: Referring Flois Mctague: [place] Derry Treating Shizuye Rupert/Extender: Frann Rider in Treatment: 14 Visit Information Patient Arrived: Ambulatory Arrival Time: 11:13 Accompanied By: wife Transfer Assistance: None Patient Identification Verified: Yes Secondary Verification Process Yes Completed: Patient Requires Transmission- No Based Precautions: Patient Has Alerts: Yes Patient Alerts: Patient on Blood Thinner DM II 81mg  aspirin twice daily History Since Last Visit All ordered tests and consults were completed: No Added or deleted any medications: No Any new allergies or adverse reactions: No Had a fall or experienced change in activities of daily living that may affect risk of falls: No Signs or symptoms of abuse/neglect since last visito No Hospitalized since last visit:  No Has Dressing in Place as Prescribed: Yes Electronic Signature(s) Signed: 06/16/2016 11:13:45 AM By: Regan Lemming BSN, RN Entered By: Regan Lemming on 06/16/2016 11:13:45 [name] ([id number]) -------------------------------------------------------------------------------- Clinic Level of Care Assessment Details Patient Name: [name] Date of Service: 06/16/2016 11:15 AM Medical Record Number: [id number] Patient Account Number: [id number] Date of Birth/Sex: [birth year] (76 y.o. Male) Treating RN: Baruch Gouty, RN, BSN, [place] Primary Care Jadiel Schmieder: Enid Derry Other Clinician: Referring Dalphine Cowie: [place] Derry Treating Elaysia Devargas/Extender: Frann Rider in Treatment: 14 Clinic Level of Care Assessment Items TOOL 4 Quantity Score []  - Use when only an EandM is performed on FOLLOW-UP visit 0 ASSESSMENTS - Nursing Assessment / Reassessment X - Reassessment of Co-morbidities (includes updates in patient status) 1 10 X - Reassessment of Adherence to Treatment Plan 1 5 ASSESSMENTS - Wound and Skin Assessment / Reassessment []  - Simple Wound Assessment / Reassessment - one wound 0 X - Complex Wound Assessment / Reassessment - multiple wounds 3 5 []  - Dermatologic / Skin Assessment (not related to wound area) 0 ASSESSMENTS - Focused Assessment []  - Circumferential Edema Measurements - multi extremities 0 []  - Nutritional Assessment / Counseling / Intervention 0 X - Lower Extremity Assessment (monofilament, tuning fork, pulses) 1 5 []  - Peripheral Arterial Disease Assessment (using hand held doppler) 0 ASSESSMENTS - Ostomy and/or Continence Assessment and Care []  - Incontinence Assessment and Management 0 []  - Ostomy Care Assessment and Management (repouching, etc.) 0 PROCESS - Coordination of Care X - Simple Patient / Family Education for ongoing care 1 15 []  - Complex (extensive) Patient / Family Education for ongoing care 0 []  - Staff obtains Programmer, systems, Records, Test Results /  Process Orders 0 []  - Staff telephones HHA, Nursing Homes / Clarify orders / etc 0 []  - Routine Transfer to another Facility (non-emergent condition) 0 [name], [name] ([id number]) []  - Routine Hospital Admission (non-emergent condition) 0 []  - New Admissions / Insurance Authorizations / Ordering NPWT, Apligraf, etc. 0 []  - Emergency  Hospital Admission (emergent condition) 0 []  - Simple Discharge Coordination 0 []  - Complex (extensive) Discharge Coordination 0 PROCESS - Special Needs []  - Pediatric / Minor Patient Management 0 []  - Isolation Patient Management 0 []  - Hearing / Language / Visual special needs 0 []  - Assessment of Community assistance (transportation, D/C planning, etc.) 0 []  - Additional assistance / Altered mentation 0 []  - Support Surface(s) Assessment (bed, cushion, seat, etc.) 0 INTERVENTIONS - Wound Cleansing / Measurement []  - Simple Wound Cleansing - one wound 0 X - Complex Wound Cleansing - multiple wounds 3 5 X - Wound Imaging (photographs - any number of wounds) 1 5 []  - Wound Tracing (instead of photographs) 0 []  - Simple Wound Measurement - one wound 0 X - Complex Wound Measurement - multiple wounds 3 5 INTERVENTIONS - Wound Dressings X - Small Wound Dressing one or multiple wounds 3 10 []  - Medium Wound Dressing one or multiple wounds 0 []  - Large Wound Dressing one or multiple wounds 0 []  - Application of Medications - topical 0 []  - Application of Medications - injection 0 INTERVENTIONS - Miscellaneous []  - External ear exam 0 [name], [name] ([id number]) []  - Specimen Collection (cultures, biopsies, blood, body fluids, etc.) 0 []  - Specimen(s) / Culture(s) sent or taken to Lab for analysis 0 []  - Patient Transfer (multiple staff / Harrel Lemon Lift / Similar devices) 0 []  - Simple Staple / Suture removal (25 or less) 0 []  - Complex Staple / Suture removal (26 or more) 0 []  - Hypo / Hyperglycemic Management (close monitor of Blood Glucose) 0 []  - Ankle /  Brachial Index (ABI) - do not check if billed separately 0 X - Vital Signs 1 5 Has the patient been seen at the hospital within the last three years: Yes Total Score: 120 Level Of Care: New/Established - Level 4 Electronic Signature(s) Signed: 06/16/2016 4:33:31 PM By: Regan Lemming BSN, RN Entered By: Regan Lemming on 06/16/2016 11:47:54 [name] ([id number]) -------------------------------------------------------------------------------- Complex / Palliative Patient Assessment Details Patient Name: [name] Date of Service: 06/16/2016 11:15 AM Medical Record Number: [id number] Patient Account Number: [id number] Date of Birth/Sex: [birth year] (76 y.o. Male) Treating RN: Cornell Barman Primary Care Darin Arndt: En[place] Derry Other Clinician: Referring Bindu Docter: [place] Derry Treating Johnice Riebe/Extender: Frann Rider in Treatment: 14 Palliative Management Criteria Complex Wound Management Criteria Patient has remarkable or complex co-morbidities requiring medications or treatments that extend wound healing times. 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 Date of Service: 06/16/2016 11:15 AM Medical Record Number: [id number] Patient Account  Number: [id number] Date of Birth/Sex: Jun 20, 1940 (76 y.o. Male) Treating RN: Baruch Gouty, RN, BSN, Velva Harman Primary Care Jamarious Febo: [place] Derry Other Clinician: Referring Kaydance Bowie: [place] Derry Treating Shayne Diguglielmo/Extender: Frann Rider in Treatment: 14 Encounter Discharge Information Items Discharge Pain Level: 0 Discharge Condition: Stable Ambulatory Status: Ambulatory Discharge Destination: Home Transportation: Private Auto Accompanied By: wife Schedule Follow-up Appointment: No Medication Reconciliation completed and  provided to Patient/Care No Naftali Carchi: Provided on Clinical Summary of Care: 06/16/2016 Form Type Recipient Paper Patient FW Electronic Signature(s) Signed: 06/16/2016 12:47:09 PM By: Regan Lemming BSN, RN Previous Signature: 06/16/2016 12:06:53 PM Version By: Ruthine Dose Entered By: Regan Lemming on 06/16/2016 12:47:09 [name] ([id number]) -------------------------------------------------------------------------------- Lower Extremity Assessment Details Patient Name: [name] Date of Service: 06/16/2016 11:15 AM Medical Record Number: [id number] Patient Account Number: [id number] Date of Birth/Sex: [birth year] (76 y.o. Male) Treating RN: Baruch Gouty, RN, BSN, Velva Harman Primary Care Lasean Gorniak: En[place] Derry Other Clinician: Referring Shaundrea Carrigg: [place] Derry Treating Jianni Batten/Extender: Frann Rider in Treatment: 14 Vascular Assessment Claudication: Claudication Assessment [Right:None] Pulses: Dorsalis Pedis Palpable: [Right:Yes] Posterior Tibial Extremity colors, hair growth, and conditions: Extremity Color: [Right:Normal] Temperature of Extremity: [Right:Warm] Capillary Refill: [Right:< 3 seconds] Electronic Signature(s) Signed: 06/16/2016 11:14:09 AM By: Regan Lemming BSN, RN Entered By: Regan Lemming on 06/16/2016 11:14:09 [name] ([id number]) -------------------------------------------------------------------------------- Multi Wound Chart  Details Patient Name: [name] Date of Service: 06/16/2016 11:15 AM Medical Record Number: [id number] Patient Account Number: [id number] Date of Birth/Sex: 03/18/1940 (76 y.o. Male) Treating RN: Baruch Gouty, RN, BSN, Velva Harman Primary Care Takoda Siedlecki: [place] Derry Other Clinician: Referring Deanglo Hissong: [place] Derry Treating Perez Dirico/Extender: Frann Rider in Treatment: 14 Vital Signs Height(in): 67 Pulse(bpm): 75 Weight(lbs): 136 Blood Pressure 116/52 (mmHg): Body Mass Index(BMI): 21 Temperature(F): 97.9 Respiratory Rate 16 (breaths/min): Photos: [1:No Photos] [2:No Photos] [3:No Photos] Wound Location: [1:Right Malleolus - Lateral] [2:Right Foot - Dorsal] [3:Right Foot - Plantar] Wounding Event: [1:Gradually Appeared] [2:Gradually Appeared] [3:Pressure Injury] Primary Etiology: [1: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] [3:Diabetic Wound/Ulcer of the Lower Extremity] Secondary Etiology: [1:Diabetic Wound/Ulcer of the Lower Extremity] [2:N/A] [3:N/A] Comorbid History: [1:Cataracts, Chronic sinus problems/congestion, Anemia, Sleep Apnea, Hypertension, Myocardial Infarction, Type II Diabetes, Received Chemotherapy, Received Radiation] [2:Cataracts, Chronic sinus problems/congestion, Anemia, Sleep  Apnea, Hypertension, Myocardial Infarction, Type II Diabetes, Received Chemotherapy, Received Radiation] [3:Cataracts, Chronic sinus problems/congestion, Anemia, Sleep Apnea, Hypertension, Myocardial Infarction, Type II Diabetes, Received Chemotherapy,  Received Radiation] Date Acquired: [1:01/09/2016] [2:06/02/2016] [3:06/15/2016] Weeks of Treatment: [1:14] [2:1] [3:0] Wound Status: [1:Open] [2:Open] [3:Open] Measurements L x W x D 0.8x0.9x0.3 [2:0.5x0.4x0.2] [3:1.5x1.5x1.5] (cm) Area (cm) : [1:0.565] [2:0.157] [3:1.767] Volume (cm) : [1:0.17] [2:0.031] [3:2.651] % Reduction in Area: [1:-139.40%] [2:19.90%] [3:N/A] % Reduction in Volume: -608.30% [2:20.50%]  [3:N/A] Classification: [1:Category/Stage III] [2:Grade 1] [3:Grade 1] HBO Classification: [1:Grade 1] [2:N/A] [3:N/A] Exudate Amount: [1:Medium] [2:Medium] [3:Medium] Exudate Type: [1:Serous] [2:Serosanguineous] [3:Serosanguineous] Exudate Color: [1:amber] [2:red, brown] [3:red, brown] Wound Margin: [1:Flat and Intact] [2:Flat and Intact] [3:Distinct, outline attached] Granulation Amount: Small (1-33%) None Present (0%) Medium (34-66%) Granulation Quality: Pink, Pale N/A Pink, Pale Necrotic Amount: Large (67-100%) Large (67-100%) Medium (34-66%) Exposed Structures: Fat Layer (Subcutaneous Fat Layer (Subcutaneous Fat Layer (Subcutaneous Tissue) Exposed: Yes Tissue) Exposed: Yes Tissue) Exposed: Yes Fascia: No Fascia: No Fascia: No Tendon: No Tendon: No Tendon: No Muscle: No Muscle: No Muscle: No Joint: No Joint: No Joint: No Bone: No Bone: No Bone: No Epithelialization: None None None Periwound Skin Texture: Induration: Yes Excoriation: No Callus: Yes Induration: No Excoriation: No Callus: No Induration: No Crepitus: No Crepitus: No Rash: No Rash: No Scarring: No Scarring: No Periwound Skin Maceration: Yes Maceration: No Maceration: No Moisture: Dry/Scaly: No Dry/Scaly: No Dry/Scaly: No Periwound Skin Color: Erythema: Yes Atrophie Blanche: No Atrophie Blanche: No Cyanosis: No Cyanosis: No Ecchymosis: No Ecchymosis: No Erythema: No Erythema: No Hemosiderin Staining: No Hemosiderin Staining: No Mottled: No Mottled: No Pallor: No Pallor: No  Rubor: No Rubor: No Erythema Location: Circumferential N/A N/A Erythema Change: Decreased N/A N/A Temperature: No Abnormality No Abnormality No Abnormality Tenderness on Yes Yes No Palpation: Wound Preparation: Ulcer Cleansing: Ulcer Cleansing: Ulcer Cleansing: Rinsed/Irrigated with Rinsed/Irrigated with Rinsed/Irrigated with Saline Saline Saline Topical Anesthetic Topical Anesthetic Topical  Anesthetic Applied: Other: lidocaine Applied: Other: lidocaine Applied: Other: lidocaine 4% 4% 4% Treatment Notes Electronic Signature(s) Signed: 06/16/2016 12:03:45 PM By: Christin Fudge MD, FACS Entered By: Christin Fudge on 06/16/2016 12:03:45 [name] ([id number]) -------------------------------------------------------------------------------- [place] Details Patient Name: [name], [name] Date of Service: 06/16/2016 11:15 AM Medical Record Number: [id number] Patient Account Number: [id number] Date of Birth/Sex: 05/01/1940 (76 y.o. Male) Treating RN: Baruch Gouty, RN, BSN, Velva Harman Primary Care Kathlee Barnhardt: En[place] Derry Other Clinician: Referring Deshunda Thackston: [place] Derry Treating Kerissa Coia/Extender: Frann Rider in Treatment: 14 Active Inactive ` Orientation to the Wound Care Program Nursing Diagnoses: Knowledge deficit related to the wound healing center program Goals: Patient/caregiver will verbalize understanding of the [facility] Date Initiated: 03/10/2016 Target Resolution Date: 03/23/2016 Goal Status: Active Interventions: Provide education on orientation to the wound center Notes: ` Soft Tissue Infection Nursing Diagnoses: Impaired tissue integrity Potential for infection: soft tissue Goals: Patient will remain free of wound infection Date Initiated: 03/10/2016 Target Resolution Date: 03/17/2016 Goal Status: Active Interventions: Assess signs and symptoms of infection every visit Notes: ` Wound/Skin Impairment Nursing Diagnoses: Impaired tissue integrity [name], [name] ([id number]) Goals: Ulcer/skin breakdown will heal within 14 weeks Date Initiated: 03/10/2016 Target Resolution Date: 03/24/2016 Goal Status: Active Interventions: Assess patient/caregiver ability to obtain necessary supplies Notes: Electronic Signature(s) Signed: 06/16/2016 4:33:31 PM By: Regan Lemming BSN, RN Entered By: Regan Lemming on 06/16/2016  11:42:47 [name] ([id number]) -------------------------------------------------------------------------------- Pain Assessment Details Patient Name: [name] Date of Service: 06/16/2016 11:15 AM Medical Record Number: [id number] Patient Account Number: [id number] Date of Birth/Sex: July 26, 1940 (76 y.o. Male) Treating RN: Baruch Gouty, RN, BSN, Velva Harman Primary Care Torrian Canion: [place] Derry Other Clinician: Referring Olive Motyka: [place] D[place] Treating Acel Natzke/Extender: Frann Rider in Treatment: 14 Active Problems Location of Pain Severity and Description of Pain Patient Has Paino No Site Locations With Dressing Change: No Pain Management and Medication Current Pain Management: Electronic Signature(s) Signed: 06/16/2016 11:13:53 AM By: Regan Lemming BSN, RN Entered By: Regan Lemming on 06/16/2016 11:13:53 [name] ([id number]) -------------------------------------------------------------------------------- Patient/Caregiver Education Details Patient Name: [name] Date of Service: 06/16/2016 11:15 AM Medical Record Number: [id number] Patient Account Number: [id number] Date of Birth/Gender: 04/23/1940 (76 y.o. Male) Treating RN: Baruch Gouty, RN, BSN, Velva Harman Primary Care Physician: [place] Derry Other Clinician: Referring Physician: [place] Derry Treating Physician/Extender: Frann Rider in Treatment: 14 Education Assessment Education Provided To: Patient Education Topics Provided Welcome To [facility]: Methods: Explain/Verbal Responses: State content correctly Wound/Skin Impairment: Methods: Explain/Verbal Responses: State content correctly Electronic Signature(s) Signed: 06/16/2016 4:33:31 PM By: Regan Lemming BSN, RN Entered By: Regan Lemming on 06/16/2016 12:47:25 [name] ([id number]) -------------------------------------------------------------------------------- Wound Assessment Details Patient Name: [name] Date of Service:  06/16/2016 11:15 AM Medical Record Number: [id number] Patient Account Number: [id number] Date of Birth/Sex: 1940-12-09 (76 y.o. Male) Treating RN: Baruch Gouty, RN, BSN, Velva Harman Primary Care Tima Curet: [place] Derry Other Clinician: Referring Sydnei Ohaver: [place] D[place] Treating Parmvir Boomer/Extender: Frann Rider in Treatment: 14 Wound Status Wound Number: 1 Primary Pressure Ulcer Etiology: Wound Location: Right Malleolus - Lateral Secondary Diabetic Wound/Ulcer of the Lower Wounding Event: Gradually Appeared Etiology: Extremity Date Acquired: 01/09/2016 Wound Open Weeks Of Treatment: 14  Status: Clustered Wound: No Comorbid Cataracts, Chronic sinus History: problems/congestion, Anemia, Sleep Apnea, Hypertension, Myocardial Infarction, Type II Diabetes, Received Chemotherapy, Received Radiation Photos Photo Uploaded By: Regan Lemming on 06/16/2016 16:31:34 Wound Measurements Length: (cm) 0.8 Width: (cm) 0.9 Depth: (cm) 0.3 Area: (cm) 0.565 Volume: (cm) 0.17 % Reduction in Area: -139.4% % Reduction in Volume: -608.3% Epithelialization: None Tunneling: No Undermining: No Wound Description 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 Severity (Wagner): Grade 1 Slough/Fibrin Wound Margin: Flat and Intact Exudate Amount: Medium Exudate Type: Serous Exudate Color: amber [name], [name] ([id number]) er Cleansing: No o Yes Wound Bed Granulation Amount: Small (1-33%) Exposed Structure Granulation Quality: Pink, Pale Fascia Exposed: No Necrotic Amount: Large (67-100%) Fat Layer (Subcutaneous Tissue) Exposed: Yes Necrotic Quality: Adherent Slough Tendon Exposed: No Muscle Exposed: No Joint Exposed: No Bone Exposed: No Periwound Skin Texture Texture Color No Abnormalities Noted: No No Abnormalities Noted: No Induration: Yes Erythema: Yes Erythema Location: Circumferential Moisture Erythema Change: Decreased No Abnormalities Noted: No Dry / Scaly: No  Temperature / Pain Maceration: Yes Temperature: No Abnormality Tenderness on Palpation: Yes Wound Preparation Ulcer Cleansing: Rinsed/Irrigated with Saline Topical Anesthetic Applied: Other: lidocaine 4%, Electronic Signature(s) Signed: 06/16/2016 11:40:42 AM By: Regan Lemming BSN, RN Entered By: Regan Lemming on 06/16/2016 11:40:40 [name] ([id number]) -------------------------------------------------------------------------------- Wound Assessment Details Patient Name: [name] Date of Service: 06/16/2016 11:15 AM Medical Record Number: [id number] Patient Account Number: [id number] Date of Birth/Sex: [birth year] (76 y.o. Male) Treating RN: Baruch Gouty, RN, BSN, [place] Primary Care Trica Usery: [place] Derry Other Clinician: Referring Jada Kuhnert: [place] D[place] Treating Pheonix Wisby/Extender: Frann Rider in Treatment: 14 Wound Status Wound Number: 2 Primary Diabetic Wound/Ulcer of the Lower Etiology: Extremity Wound Location: Right Foot - Dorsal Wound Open Wounding Event: Gradually Appeared Status: Date Acquired: 06/02/2016 Comorbid Cataracts, Chronic sinus Weeks Of Treatment: 1 History: problems/congestion, Anemia, Sleep Clustered Wound: No Apnea, Hypertension, Myocardial Infarction, Type II Diabetes, Received Chemotherapy, Received Radiation Photos Photo Uploaded By: Regan Lemming on 06/16/2016 16:32:39 Wound Measurements Length: (cm) 0.5 Width: (cm) 0.4 Depth: (cm) 0.2 Area: (cm) 0.157 Volume: (cm) 0.031 % Reduction in Area: 19.9% % Reduction in Volume: 20.5% Epithelialization: None Tunneling: No Undermining: No Wound Description Classification: Grade 1 Foul Odor Aft Wound Margin: Flat and Intact Slough/Fibrin Exudate Amount: Medium Exudate Type: Serosanguineous Exudate Color: red, brown er Cleansing: No o Yes Wound Bed Granulation Amount: None Present (0%) Exposed Structure Necrotic Amount: Large (67-100%) Fascia Exposed: No [name], [name]  ([id number]) Fat Layer (Subcutaneous Tissue) Exposed: Yes Tendon Exposed: No Muscle Exposed: No Joint Exposed: No Bone Exposed: No Periwound Skin Texture Texture Color No Abnormalities Noted: No No Abnormalities Noted: No Callus: No Atrophie Blanche: No Crepitus: No Cyanosis: No Excoriation: No Ecchymosis: No Induration: No Erythema: No Rash: No Hemosiderin Staining: No Scarring: No Mottled: No Pallor: No Moisture Rubor: No No Abnormalities Noted: No Dry / Scaly: No Temperature / Pain Maceration: No Temperature: No Abnormality Tenderness on Palpation: Yes Wound Preparation Ulcer Cleansing: Rinsed/Irrigated with Saline Topical Anesthetic Applied: Other: lidocaine 4%, Electronic Signature(s) Signed: 06/16/2016 11:41:18 AM By: Regan Lemming BSN, RN Entered By: Regan Lemming on 06/16/2016 11:41:17 [name] ([id number]) -------------------------------------------------------------------------------- Wound Assessment Details Patient Name: [name] Date of Service: 06/16/2016 11:15 AM Medical Record Number: [id number] Patient Account Number: [id number] Date of Birth/Sex: [birth year] (76 y.o. Male) Treating RN: Baruch Gouty, RN, BSN, Velva Harman Primary Care Alvah Gilder: [place] D[place] Other Clinician: Referring Jonell Krontz: [place] D[place] Treating Domino Holten/Extender: Frann Rider in Treatment: 14 Wound Status Wound  Number: 3 Primary Diabetic Wound/Ulcer of the Lower Etiology: Extremity Wound Location: Right Foot - Plantar Wound Open Wounding Event: Pressure Injury Status: Date Acquired: 06/15/2016 Comorbid Cataracts, Chronic sinus Weeks Of Treatment: 0 History: problems/congestion, Anemia, Sleep Clustered Wound: No Apnea, Hypertension, Myocardial Infarction, Type II Diabetes, Received Chemotherapy, Received Radiation Photos Photo Uploaded By: Regan Lemming on 06/16/2016 16:33:00 Wound Measurements Length: (cm) 1.5 Width: (cm) 1.5 Depth: (cm) 1.5 Area: (cm)  1.767 Volume: (cm) 2.651 % Reduction in Area: % Reduction in Volume: Epithelialization: None Tunneling: No Undermining: No Wound Description Classification: Grade 1 Foul Odor Aft Wound Margin: Distinct, outline attached Slough/Fibrin Exudate Amount: Medium Exudate Type: Serosanguineous Exudate Color: red, brown er Cleansing: No o Yes Wound Bed Granulation Amount: Medium (34-66%) Exposed Structure Granulation Quality: Pink, Pale Fascia Exposed: No [name], [name] ([id number]) Necrotic Amount: Medium (34-66%) Fat Layer (Subcutaneous Tissue) Exposed: Yes Necrotic Quality: Adherent Slough Tendon Exposed: No Muscle Exposed: No Joint Exposed: No Bone Exposed: No Periwound Skin Texture Texture Color No Abnormalities Noted: No No Abnormalities Noted: No Callus: Yes Atrophie Blanche: No Crepitus: No Cyanosis: No Excoriation: No Ecchymosis: No Induration: No Erythema: No Rash: No Hemosiderin Staining: No Scarring: No Mottled: No Pallor: No Moisture Rubor: No No Abnormalities Noted: No Dry / Scaly: No Temperature / Pain Maceration: No Temperature: No Abnormality Wound Preparation Ulcer Cleansing: Rinsed/Irrigated with Saline Topical Anesthetic Applied: Other: lidocaine 4%, Electronic Signature(s) Signed: 06/16/2016 4:33:31 PM By: Regan Lemming BSN, RN Entered By: Regan Lemming on 06/16/2016 11:23:01 [name] ([id number]) -------------------------------------------------------------------------------- Vitals Details Patient Name: [name] Date of Service: 06/16/2016 11:15 AM Medical Record Number: [id number] Patient Account Number: [id number] Date of Birth/Sex: May 21, 1940 (76 y.o. Male) Treating RN: Baruch Gouty, RN, BSN, [facility] Lorris Carducci: [place] D[place] Other Clinician: Referring Willene Holian: [place] Derry Treating Defne Gerling/Extender: Frann Rider in Treatment: 14 Vital Signs Time Taken: 11:15 Temperature (F): 97.9 Height (in): 67 Pulse  (bpm): 75 Weight (lbs): 136 Respiratory Rate (breaths/min): 16 Body Mass Index (BMI): 21.3 Blood Pressure (mmHg): 116/52 Reference Range: 80 - 120 mg / dl Electronic Signature(s) Signed: 06/16/2016 4:33:31 PM By: Regan Lemming BSN, RN Entered By: Regan Lemming on 06/16/2016 11:17:07

## 2016-06-18 NOTE — Progress Notes (Signed)
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  INTERVAL HISTORY:  Patient returns to clinic today for further evaluation and consideration of cycle 3, day 15 of Cyramza only. His neuropathy is getting worse despite discontinuing Taxol. He has a decreased appetite and his weight continues to trend down. He has no other neurologic complaints. He denies any recent fevers or illnesses. He denies any chest pain or shortness of breath. He denies any nausea, vomiting, constipation, or diarrhea. He has no urinary complaints. Patient otherwise feels well and offers no further specific complaints.  REVIEW OF SYSTEMS:   Review of Systems  Constitutional: Positive for malaise/fatigue and weight loss. Negative for fever.  HENT: Negative.  Negative for congestion.   Respiratory: Negative for cough and shortness of breath.   Cardiovascular: Negative.  Negative for chest pain and leg swelling.  Gastrointestinal: Negative.  Negative for abdominal pain, blood in stool and melena.  Genitourinary: Negative.   Musculoskeletal: Negative.   Neurological: Positive for sensory change and weakness.  Psychiatric/Behavioral: Negative.  The patient is not nervous/anxious.     As per HPI. Otherwise, a complete review of systems is negative.  PAST MEDICAL HISTORY: Past Medical History:  Diagnosis Date  . Anemia   . Arthritis   . 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 Diabetes mellitus without complication ([place])   . Ectatic abdominal aorta ([place]) 08/12/2015  . Esophageal cancer ([place])     . GI bleed   . Hearing loss    left ear  . Hypertension   . Hypothyroidism   . Liver mass   . Mild left atrial enlargement 08/12/2015   Very mild; LA 4.2 cm; echo March 2017  . Mitral valvular regurgitation 08/12/2015  . Sleep apnea   . Stroke ([place])   . TIA (transient ischemic attack)   . 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 APPENDECTOMY    . EUS N/A 10/07/2015   Procedure: FULL UPPER ENDOSCOPIC ULTRASOUND (EUS) RADIAL;  Surgeon: Holly Bodily, MD;  Location: [facility] ENDOSCOPY;  Service: Endoscopy;  Laterality: N/A;  . EYE SURGERY    . FRACTURE SURGERY    . NOSE SURGERY    . PERIPHERAL VASCULAR CATHETERIZATION N/A 10/26/2015   Procedure: Glori Luis Cath Insertion;  Surgeon: Katha Cabal, MD;  Location: [facility];  Service: Cardiovascular;  Laterality: N/A;    FAMILY HISTORY Family History  Problem Relation Age of Onset  . Hypertension Mother   . Lung cancer Father   . Hypertension Brother        ADVANCED DIRECTIVES:    HEALTH MAINTENANCE: Social History  Substance Use Topics  . Smoking status: Former Smoker    Packs/day: 1.00    Types: Cigarettes    Quit date: 10/03/1983  . Smokeless tobacco: Never Used  . Alcohol use 0.0 oz/week     Comment: rarely     Colonoscopy:  PAP:  Bone density:  Lipid panel:  Allergies  Allergen Reactions  . Sulfa Antibiotics Itching and Hives  .  Amoxicillin Hives  . Ampicillin Hives  . Keflex [Cephalexin] Itching  . Lisinopril Other (See Comments)  . Losartan Other (See Comments)  . Sulfamethoxazole-Trimethoprim Other (See Comments)  . Cephalosporins Hives    Current Outpatient Prescriptions  Medication Sig Dispense Refill  . aspirin EC 81 MG tablet Take 81 mg by mouth 2 (two) times daily.     Marland Kitchen BIOTIN PO Take by mouth.    . Cholecalciferol (VITAMIN D3) 1000 UNITS CAPS Take 2,000 Units by mouth daily.     .  Coenzyme Q10 (CO Q-10) 200 MG CAPS Take 200 mg by mouth daily.     Marland Kitchen glucose blood (FREESTYLE LITE) test strip Use 2 (two) times daily. Free style lite test strips    . isosorbide mononitrate (IMDUR) 60 MG 24 hr tablet Take 1 tablet (60 mg total) by mouth daily. 30 tablet 0  . levothyroxine (SYNTHROID, LEVOTHROID) 100 MCG tablet Take 100 mcg by mouth every morning.    . lidocaine-prilocaine (EMLA) cream Apply 1 application topically as needed. Apply to port 1-2 hours prior to chemotherapy. Cover with plastic wrap. 30 g 2  . Lutein 40 MG CAPS Take 40 mg by mouth daily.     . Magnesium Oxide 250 MG TABS Take 1 tablet (250 mg total) by mouth daily.  0  . metFORMIN (GLUCOPHAGE) 1000 MG tablet Take 2,000 mg by mouth daily.    . metoprolol tartrate (LOPRESSOR) 25 MG tablet Take 12.5 mg by mouth 2 (two) times daily.     . Misc Natural Products (BEE PROPOLIS PO) Take 1 tablet by mouth 2 (two) times daily.     . Multiple Vitamins-Minerals (MULTI FOR HIM 50+ PO) Take 1 tablet by mouth daily.    . ondansetron (ZOFRAN) 8 MG tablet Take 1 tablet (8 mg total) by mouth every 8 (eight) hours as needed for nausea or vomiting. 30 tablet 2  . prochlorperazine (COMPAZINE) 10 MG tablet Take 1 tablet (10 mg total) by mouth every 6 (six) hours as needed for nausea or vomiting. 30 tablet 2  . Red Yeast Rice 600 MG TABS Take 2 tablets by mouth at bedtime.    . sucralfate (CARAFATE) 1 g tablet Take 1 tablet (1 g total) by mouth 3 (three) times daily. Dissolve each tablet in 2-3 tbsp warm water, swish and swallow. 90 tablet 2  . Wound Dressings (MEDIHONEY WOUND/BURN DRESSING) PSTE   0   No current facility-administered medications for this visit.    Facility-Administered Medications Ordered in Other Visits  Medication Dose Route Frequency Provider Last Rate Last Dose  . 0.9 %  sodium chloride infusion   Intravenous Continuous Lloyd Huger, MD      . heparin lock flush 100 unit/mL  500 Units Intracatheter Once PRN  Lloyd Huger, MD        OBJECTIVE: Vitals:   06/19/16 1251  BP: 127/71  Pulse: 75  Temp: (!) 96.6 F (35.9 C)     Body mass index is 20.53 kg/m.    ECOG FS:1 - Symptomatic but completely ambulatory  General: Well-developed, well-nourished, no acute distress. Eyes: Pink conjunctiva, anicteric sclera. Lungs: Clear to auscultation bilaterally. Heart: Regular rate and rhythm. No rubs, murmurs, or gallops. Abdomen: Soft, nontender, nondistended. No organomegaly noted, normoactive bowel sounds. Musculoskeletal: No edema, cyanosis, or clubbing. Neuro: Alert, answering all questions appropriately. Cranial nerves grossly intact. Skin: No rashes or petechiae noted. 1 cm ulcer on right ankle unchanged. Psych: Normal affect.   LAB RESULTS:  Lab Results  Component Value Date   NA 131 (L) 06/19/2016   K 4.5 06/19/2016   CL 95 (L) 06/19/2016   CO2 25 06/19/2016   GLUCOSE 149 (H) 06/19/2016   BUN 15 06/19/2016   CREATININE 0.71 06/19/2016   CALCIUM 9.6 06/19/2016   PROT 7.7 06/19/2016   ALBUMIN 3.8 06/19/2016   AST 33 06/19/2016   ALT 17 06/19/2016   ALKPHOS 67 06/19/2016   BILITOT 0.4 06/19/2016   GFRNONAA >60 06/19/2016   GFRAA >60 06/19/2016    Lab Results  Component Value Date   WBC 9.6 06/19/2016   NEUTROABS 7.9 (H) 06/19/2016   HGB 9.6 (L) 06/19/2016   HCT 28.1 (L) 06/19/2016   MCV 85.0 06/19/2016   PLT 519 (H) 06/19/2016    Lab Results  Component Value Date   CA199 12,389 (H) 06/05/2016   Lab Results  Component Value Date   CEA 538.5 (H) 06/05/2016     STUDIES: No results found.  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  PLAN:    1. 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 Patient's tumor markers are essentially unchanged. He received cycle 9 of FOLFOX plus  Herceptin + Neulasta on 03/27/2016.  Patient will receive Cyramza on days 1 and 15. Taxol was discontinued secondary to peripheral neuropathy.  Proceed with cycle 3, day 15. No treatment next week, but patient will return to clinic in 2 weeks for consideration of cycle 4, day 1 which will be Cyramza only. Will reimage with PET scan prior to next treatment. 2. Iron deficiency anemia: Consider IV iron in the future. 3. Cardiac disease: Treatment per cardiology. MUGA scan on March 20, 2016 reported an EF of 52.8%, unchanged. 4. Weight loss: Monitor. 5. Dysphagia: XRT has been completed.  Patient denies any issues swallowing. 6. Weakness and fatigue: Improving. 7. Ankle ulcer: Nonhealing, continue treatment per wound care.  Dressing intact. 8. Peripheral neuropathy: Discontinue Taxol as above.  Patient expressed understanding and was in agreement with this plan. He also understands that He can call clinic at any time with any questions, concerns, or complaints.    Lloyd Huger, MD   06/19/2016 2:51 PM

## 2016-06-18 NOTE — Progress Notes (Signed)
GLENDON, DUNWOODY ([id number]) Visit Report for 06/16/2016 Chief Complaint Document Details Patient Name: [name], [name]. Date of Service: 06/16/2016 11:15 AM Medical Record Number: [id number] Patient Account Number: [id number] Date of Birth/Sex: February 10, 1941 (76 y.o. Male) Treating RN: Baruch Gouty, RN, BSN, Velva Harman Primary Care Provider: Enid Derry Other Clinician: Referring Provider: Enid Derry Treating Provider/Extender: Frann Rider in Treatment: 14 Information Obtained from: Patient Chief Complaint Patient is at the clinic for treatment of an open pressure ulcer to the right lateral ankle which she's had for about 2 months Electronic Signature(s) Signed: 06/16/2016 12:03:55 PM By: Christin Fudge MD, FACS Entered By: Christin Fudge on 06/16/2016 12:03:55 [name] ([id number]) -------------------------------------------------------------------------------- HPI Details Patient Name: [name] Date of Service: 06/16/2016 11:15 AM Medical Record Number: [id number] Patient Account Number: [id number] Date of Birth/Sex: 1940-05-09 (76 y.o. Male) Treating RN: Baruch Gouty, RN, BSN, Velva Harman Primary Care Provider: Enid Derry Other Clinician: Referring Provider: Enid Derry Treating Provider/Extender: Frann Rider in Treatment: 14 History of Present Illness Location: Patient presents with an ulcer on the right lateral ankle. Quality: Patient reports experiencing a dull pain to affected area(s). Severity: Patient states wound are getting worse. Duration: Patient has had the wound for > 2 months prior to seeking treatment at the wound center Timing: Pain in wound is Intermittent (comes and goes Context: The wound appeared gradually over time Modifying Factors: Other treatment(s) tried include:local care as per the medical oncologist Associated Signs and Symptoms: Patient reports having:some pain and no discharge HPI Description: 76 year old patient has been referred by his medical  oncologist Dr. Grayland Ormond, for a right lateral malleolus ulcer has been there for several weeks. 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 He is currently on chemotherapy with FOLFOX and Herceptin. past medical history significant for diabetes mellitus, anemia, hypertension, sleep apnea, status post appendectomy, peripheral vascular catheterization( Port placement) in September 2017 by Dr. Delana Meyer. He is also receiving palliative radiation therapy and was seen by Dr. Donella Stade. 03/17/2016 -- - x-ray of the right ankle -- IMPRESSION: 1. Soft tissue wound noted over the lateral malleolus, no underlying bony abnormality. No acute bony abnormality. 2. Diffuse degenerative change. 3. Peripheral vascular disease. 05/05/2016 -- the patient's wife tells me that she has noticed a small superficial bruise on the left lateral ankle and wanted me to view this area. 05/12/2016 -- the patient is unable to afford Santyl ointment and we have been struggling over the last 8 weeks to use various alternatives. I have asked them today if they would be willing to use the snap vacuum system and she will let me know soon. 05/19/2016 -- the patient and the wife have declined the use of the snap the vacuum system. He does not want any debridement to be done today. His proteins have improved a bit. 06/08/2016 -- . Not seen the patient back for 3 weeks and in the meanwhile the wife has decided to use duoderm on the wound,-- given to her by a friend. The wound is macerated and has increased in size due to the moisture. 06/16/2016 -- besides the original wound he had on his right lateral malleolus he has a small area on the dorsum of his right foot which is possibly cause due to pressure. He now has a plantar wound on the first metatarsal head on the right foot where his podiatrist Dr. Vickki Muff had debrided a callus yesterday. Electronic Signature(s) Signed: 06/16/2016  12:04:42 PM By: Con Memos,  Roderick Pee MD, FACS Entered By: Christin Fudge on 06/16/2016 12:04:41 [name] ([name] ([id number]) -------------------------------------------------------------------------------- Physical Exam Details Patient Name: [name], [name] Date of Service: 06/16/2016 11:15 AM Medical Record Number: [id number] Patient Account Number: [id number] Date of Birth/Sex: 12/09/1940 (76 y.o. Male) Treating RN: Baruch Gouty, RN, BSN, Velva Harman Primary Care Provider: Enid Derry Other Clinician: Referring Provider: Enid Derry Treating Provider/Extender: Frann Rider in Treatment: 14 Constitutional . Pulse regular. Respirations normal and unlabored. Afebrile. . Eyes Nonicteric. Reactive to light. Ears, Nose, Mouth, and Throat Lips, teeth, and gums WNL.Marland Kitchen Moist mucosa without lesions. Neck supple and nontender. No palpable supraclavicular or cervical adenopathy. Normal sized without goiter. Respiratory WNL. No retractions.. Breath sounds WNL, No rubs, rales, rhonchi, or wheeze.. Cardiovascular Heart rhythm and rate regular, no murmur or gallop.. Pedal Pulses WNL. No clubbing, cyanosis or edema. Chest Breasts symmetical and no nipple discharge.. Breast tissue WNL, no masses, lumps, or tenderness.. Lymphatic No adneopathy. No adenopathy. No adenopathy. Musculoskeletal Adexa without tenderness or enlargement.. Digits and nails w/o clubbing, cyanosis, infection, petechiae, ischemia, or inflammatory conditions.. Integumentary (Hair, Skin) No suspicious lesions. No crepitus or fluctuance. No peri-wound warmth or erythema. No masses.Marland Kitchen Psychiatric Judgement and insight Intact.. No evidence of depression, anxiety, or agitation.. Notes after review today none of the wounds need sharp debridement. The maceration on the right lateral malleolus is much better. I have recommended Santyl to be applied to his right lateral malleolus and right dorsum of the foot. The  plantar aspect of his right foot will be dressed with silver alginate and a bordered foam Electronic Signature(s) Signed: 06/16/2016 12:05:45 PM By: Christin Fudge MD, FACS Entered By: Christin Fudge on 06/16/2016 12:05:44 [name] ([id number]) -------------------------------------------------------------------------------- Physician Orders Details Patient Name: [name] Date of Service: 06/16/2016 11:15 AM Medical Record Number: [id number] Patient Account Number: [id number] Date of Birth/Sex: [birth year] (76 y.o. Male) Treating RN: Baruch Gouty, RN, BSN, Velva Harman Primary Care Provider: Enid Derry Other Clinician: Referring Provider: Enid Derry Treating Provider/Extender: Frann Rider in Treatment: 34 Verbal / Phone Orders: No Diagnosis Coding Wound Cleansing Wound #1 Right,Lateral Malleolus o Clean wound with Normal Saline. o Cleanse wound with mild soap and water Wound #3 Right,Plantar Foot o Clean wound with Normal Saline. o Cleanse wound with mild soap and water Wound #2 Right,Dorsal Foot o Clean wound with Normal Saline. o Cleanse wound with mild soap and water Anesthetic Wound #1 Right,Lateral Malleolus o Topical Lidocaine 4% cream applied to wound bed prior to debridement Wound #3 Right,Plantar Foot o Topical Lidocaine 4% cream applied to wound bed prior to debridement Wound #2 Right,Dorsal Foot o Topical Lidocaine 4% cream applied to wound bed prior to debridement Skin Barriers/Peri-Wound Care Wound #1 Right,Lateral Malleolus o Antifungal cream - LOTRISONSE.Marland Kitchenapply around wound on reddened areas as needed Wound #2 Right,Dorsal Foot o Antifungal cream - LOTRISONSE.Marland Kitchenapply around wound on reddened areas as needed Primary Wound Dressing Wound #1 Right,Lateral Malleolus o Santyl Ointment - only in clinic o Hydrogel - similar to [place] jelly over the counter o Cutimed Sorbact - cut small piece and insert in wound Wound #2 [place] ([id number]) o Santyl Ointment - only in clinic o Hydrogel - similar to [place] jelly over the counter o Cutimed Sorbact - cut small piece and insert in wound Wound #3 Right,Plantar Foot o Aquacel Ag Secondary Dressing Wound #1 Right,Lateral Malleolus o Dry Gauze o Boardered Foam Dressing Wound #2 Right,Dorsal Foot o Dry Gauze o Boardered Foam Dressing  Wound #3 Right,Plantar Foot o Dry Gauze o Boardered Foam Dressing Dressing Change Frequency Wound #1 Right,Lateral Malleolus o Change dressing every day. Wound #2 Right,Dorsal Foot o Change dressing every day. Follow-up Appointments Wound #1 Right,Lateral Malleolus o Return Appointment in 1 week. Wound #2 Right,Dorsal Foot o Return Appointment in 1 week. Edema Control Wound #1 Right,Lateral Malleolus o Elevate legs to the level of the heart and pump ankles as often as possible Wound #2 Right,Dorsal Foot o Elevate legs to the level of the heart and pump ankles as often as possible Off-Loading Wound #1 Right,Lateral Malleolus o Other: - Keep pressure off the malleolus Wound #2 Right,Dorsal Foot o Other: - Keep pressure off the malleolus [name] ([id number]) Additional Orders / Instructions Wound #1 Right,Lateral Malleolus o Increase protein intake. o Activity as tolerated Wound #2 Right,Dorsal Foot o Increase protein intake. o Activity as tolerated Medications-please add to medication list. Wound #1 Right,Lateral Malleolus o Other: - Include these in your diet...VITAMIN A, C, ZINC, MVI Electronic Signature(s) Signed: 06/16/2016 4:27:48 PM By: Christin Fudge MD, FACS Signed: 06/16/2016 4:33:31 PM By: Regan Lemming BSN, RN Entered By: Regan Lemming on 06/16/2016 11:52:57 [name] ([id number]) -------------------------------------------------------------------------------- Problem List Details Patient Name: [name], [name] Date of Service: 06/16/2016  11:15 AM Medical Record Number: [id number] Patient Account Number: [id number] Date of Birth/Sex: 1940-03-19 (76 y.o. Male) Treating RN: Baruch Gouty, RN, BSN, Velva Harman Primary Care Provider: Enid Derry Other Clinician: Referring Provider: Enid Derry Treating Provider/Extender: Frann Rider in Treatment: 14 Active Problems ICD-10 Encounter Code Description Active Date Diagnosis E11.621 Type 2 diabetes mellitus with foot ulcer 05/05/2016 Yes L89.512 Pressure ulcer of right ankle, stage 2 05/05/2016 Yes L89.521 Pressure ulcer of left ankle, stage 1 05/05/2016 Yes 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 05/05/2016 Yes Inactive Problems Resolved Problems Electronic Signature(s) Signed: 06/16/2016 12:03:39 PM By: Christin Fudge MD, FACS Entered By: Christin Fudge on 06/16/2016 12:03:39 [name] ([id number]) -------------------------------------------------------------------------------- Progress Note Details Patient Name: [name] Date of Service: 06/16/2016 11:15 AM Medical Record Number: [id number] Patient Account Number: [id number] Date of Birth/Sex: 11-07-1940 (76 y.o. Male) Treating RN: Baruch Gouty, RN, BSN, Velva Harman Primary Care Provider: Enid Derry Other Clinician: Referring Provider: Enid Derry Treating Provider/Extender: Frann Rider in Treatment: 14 Subjective Chief Complaint Information obtained from Patient Patient is at the clinic for treatment of an open pressure ulcer to the right lateral ankle which she's had for about 2 months History of Present Illness (HPI) The following HPI elements were documented for the patient's wound: Location: Patient presents with an ulcer on the right lateral ankle. Quality: Patient reports experiencing a dull pain to affected area(s). Severity: Patient states wound are getting worse. Duration: Patient has had the wound for > 2 months prior to seeking treatment at the wound center Timing: Pain in wound is Intermittent (comes  and goes Context: The wound appeared gradually over time Modifying Factors: Other treatment(s) tried include:local care as per the medical oncologist Associated Signs and Symptoms: Patient reports having:some pain and no discharge 76 year old patient has been referred by his medical oncologist Dr. Grayland Ormond, for a right lateral malleolus ulcer has been there for several weeks. 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 He is currently on chemotherapy with FOLFOX and Herceptin. past medical history significant for diabetes mellitus, anemia, hypertension, sleep apnea, status post appendectomy, peripheral vascular catheterization( Port placement) in September 2017 by Dr. Delana Meyer. He is also receiving palliative radiation therapy  and was seen by Dr. Donella Stade. 03/17/2016 -- - x-ray of the right ankle -- IMPRESSION: 1. Soft tissue wound noted over the lateral malleolus, no underlying bony abnormality. No acute bony abnormality. 2. Diffuse degenerative change. 3. Peripheral vascular disease. 05/05/2016 -- the patient's wife tells me that she has noticed a small superficial bruise on the left lateral ankle and wanted me to view this area. 05/12/2016 -- the patient is unable to afford Santyl ointment and we have been struggling over the last 8 weeks to use various alternatives. I have asked them today if they would be willing to use the snap vacuum system and she will let me know soon. 05/19/2016 -- the patient and the wife have declined the use of the snap the vacuum system. He does not want any debridement to be done today. His proteins have improved a bit. 06/08/2016 -- . Not seen the patient back for 3 weeks and in the meanwhile the wife has decided to use duoderm on the wound,-- given to her by a friend. The wound is macerated and has increased in size due to the moisture. [name], [name] ([id number]) 06/16/2016 -- besides the original wound  he had on his right lateral malleolus he has a small area on the dorsum of his right foot which is possibly cause due to pressure. He now has a plantar wound on the first metatarsal head on the right foot where his podiatrist Dr. Vickki Muff had debrided a callus yesterday. Allergies sufa drugs, Cephalosporins, lisinopril, losartin, Keflex, amoxicillin, ampicillin, Septra, Bactrim Objective Constitutional Pulse regular. Respirations normal and unlabored. Afebrile. Vitals Time Taken: 11:15 AM, Height: 67 in, Weight: 136 lbs, BMI: 21.3, Temperature: 97.9 F, Pulse: 75 bpm, Respiratory Rate: 16 breaths/min, Blood Pressure: 116/52 mmHg. Eyes Nonicteric. Reactive to light. Ears, Nose, Mouth, and Throat Lips, teeth, and gums WNL.Marland Kitchen Moist mucosa without lesions. Neck supple and nontender. No palpable supraclavicular or cervical adenopathy. Normal sized without goiter. Respiratory WNL. No retractions.. Breath sounds WNL, No rubs, rales, rhonchi, or wheeze.. Cardiovascular Heart rhythm and rate regular, no murmur or gallop.. Pedal Pulses WNL. No clubbing, cyanosis or edema. Chest Breasts symmetical and no nipple discharge.. Breast tissue WNL, no masses, lumps, or tenderness.. Lymphatic No adneopathy. No adenopathy. No adenopathy. Musculoskeletal Adexa without tenderness or enlargement.. Digits and nails w/o clubbing, cyanosis, infection, petechiae, ischemia, or inflammatory conditions.Marland Kitchen Psychiatric Judgement and insight Intact.. No evidence of depression, anxiety, or agitation.[name], [name] ([id number]) General Notes: after review today none of the wounds need sharp debridement. The maceration on the right lateral malleolus is much better. I have recommended Santyl to be applied to his right lateral malleolus and right dorsum of the foot. The plantar aspect of his right foot will be dressed with silver alginate and a bordered foam Integumentary (Hair, Skin) No suspicious lesions. No crepitus  or fluctuance. No peri-wound warmth or erythema. No masses.. Wound #1 status is Open. Original cause of wound was Gradually Appeared. The wound is located on the Right,Lateral Malleolus. The wound measures 0.8cm length x 0.9cm width x 0.3cm depth; 0.565cm^2 area and 0.17cm^3 volume. There is Fat Layer (Subcutaneous Tissue) Exposed exposed. There is no tunneling or undermining noted. There is a medium amount of serous drainage noted. The wound margin is flat and intact. There is small (1-33%) pink, pale granulation within the wound bed. There is a large (67-100%) amount of necrotic tissue within the wound bed including Adherent Slough. The periwound skin appearance exhibited: Induration, Maceration, Erythema. The periwound  skin appearance did not exhibit: Dry/Scaly. The surrounding wound skin color is noted with erythema which is circumferential. Periwound temperature was noted as No Abnormality. The periwound has tenderness on palpation. Wound #2 status is Open. Original cause of wound was Gradually Appeared. The wound is located on the Right,Dorsal Foot. The wound measures 0.5cm length x 0.4cm width x 0.2cm depth; 0.157cm^2 area and 0.031cm^3 volume. There is Fat Layer (Subcutaneous Tissue) Exposed exposed. There is no tunneling or undermining noted. There is a medium amount of serosanguineous drainage noted. The wound margin is flat and intact. There is no granulation within the wound bed. There is a large (67-100%) amount of necrotic tissue within the wound bed. The periwound skin appearance did not exhibit: Callus, Crepitus, Excoriation, Induration, Rash, Scarring, Dry/Scaly, Maceration, Atrophie Blanche, Cyanosis, Ecchymosis, Hemosiderin Staining, Mottled, Pallor, Rubor, Erythema. Periwound temperature was noted as No Abnormality. The periwound has tenderness on palpation. Wound #3 status is Open. Original cause of wound was Pressure Injury. The wound is located on the [place].  The wound measures 1.5cm length x 1.5cm width x 1.5cm depth; 1.767cm^2 area and 2.651cm^3 volume. There is Fat Layer (Subcutaneous Tissue) Exposed exposed. There is no tunneling or undermining noted. There is a medium amount of serosanguineous drainage noted. The wound margin is distinct with the outline attached to the wound base. There is medium (34-66%) pink, pale granulation within the wound bed. There is a medium (34-66%) amount of necrotic tissue within the wound bed including Adherent Slough. The periwound skin appearance exhibited: Callus. The periwound skin appearance did not exhibit: Crepitus, Excoriation, Induration, Rash, Scarring, Dry/Scaly, Maceration, Atrophie Blanche, Cyanosis, Ecchymosis, Hemosiderin Staining, Mottled, Pallor, Rubor, Erythema. Periwound temperature was noted as No Abnormality. Assessment Active Problems ICD-10 E11.621 - Type 2 diabetes mellitus with foot ulcer L89.512 - Pressure ulcer of right ankle, stage 2 [name], [name] ([id number]) L89.521 - Pressure ulcer of left ankle, stage 1 E44.1 - Mild protein-calorie malnutrition Plan Wound Cleansing: Wound #1 Right,Lateral Malleolus: Clean wound with Normal Saline. Cleanse wound with mild soap and water Wound #3 Right,Plantar Foot: Clean wound with Normal Saline. Cleanse wound with mild soap and water Wound #2 Right,Dorsal Foot: Clean wound with Normal Saline. Cleanse wound with mild soap and water Anesthetic: Wound #1 Right,Lateral Malleolus: Topical Lidocaine 4% cream applied to wound bed prior to debridement Wound #3 Right,Plantar Foot: Topical Lidocaine 4% cream applied to wound bed prior to debridement Wound #2 Right,Dorsal Foot: Topical Lidocaine 4% cream applied to wound bed prior to debridement Skin Barriers/Peri-Wound Care: Wound #1 Right,Lateral Malleolus: Antifungal cream - LOTRISONSE.Marland Kitchenapply around wound on reddened areas as needed Wound #2 Right,Dorsal Foot: Antifungal cream -  LOTRISONSE.Marland Kitchenapply around wound on reddened areas as needed Primary Wound Dressing: Wound #1 Right,Lateral Malleolus: Santyl Ointment - only in clinic Hydrogel - similar to [place] jelly over the counter Cutimed Sorbact - cut small piece and insert in wound Wound #2 Right,Dorsal Foot: Santyl Ointment - only in clinic Hydrogel - similar to [place] jelly over the counter Cutimed Sorbact - cut small piece and insert in wound Wound #3 Right,Plantar Foot: Aquacel Ag Secondary Dressing: Wound #1 Right,Lateral Malleolus: Dry Gauze Boardered Foam Dressing Wound #2 Right,Dorsal Foot: Dry Gauze Boardered Foam Dressing [name], [name] ([id number]) Wound #3 Right,Plantar Foot: Dry Gauze Boardered Foam Dressing Dressing Change Frequency: Wound #1 Right,Lateral Malleolus: Change dressing every day. Wound #2 Right,Dorsal Foot: Change dressing every day. Follow-up Appointments: Wound #1 Right,Lateral Malleolus: Return Appointment in 1 week. Wound #2 Right,Dorsal Foot:  Return Appointment in 1 week. Edema Control: Wound #1 Right,Lateral Malleolus: Elevate legs to the level of the heart and pump ankles as often as possible Wound #2 Right,Dorsal Foot: Elevate legs to the level of the heart and pump ankles as often as possible Off-Loading: Wound #1 Right,Lateral Malleolus: Other: - Keep pressure off the malleolus Wound #2 Right,Dorsal Foot: Other: - Keep pressure off the malleolus Additional Orders / Instructions: Wound #1 Right,Lateral Malleolus: Increase protein intake. Activity as tolerated Wound #2 Right,Dorsal Foot: Increase protein intake. Activity as tolerated Medications-please add to medication list.: Wound #1 Right,Lateral Malleolus: Other: - Include these in your diet...VITAMIN A, C, ZINC, MVI The patient has had a procedure done by the podiatrist and now has a wound on the plantar aspect of his right first metatarsal head where the callus was trimmed. After review today, none of  the wounds need sharp debridement. The maceration on the right lateral malleolus is much better. I have recommended Santyl to be applied to his right lateral malleolus and right dorsum of the foot. at home, since she cannot afford Santyl ointment we will use hydrogel and Sorbact. The plantar aspect of his right foot will be dressed with silver alginate and a bordered foam. Offloading has been discussed in great detail and he is not going to be able to wear a total contact cast. [name], [name] ([id number]) Electronic Signature(s) Signed: 06/16/2016 12:07:58 PM By: Christin Fudge MD, FACS Entered By: Christin Fudge on 06/16/2016 12:07:58 [name] ([id number]) -------------------------------------------------------------------------------- SuperBill Details Patient Name: [name] Date of Service: 06/16/2016 Medical Record Number: [id number] Patient Account Number: [id number] Date of Birth/Sex: [birth year] (76 y.o. Male) Treating RN: Baruch Gouty, RN, BSN, Velva Harman Primary Care Provider: Enid Derry Other Clinician: Referring Provider: Enid Derry Treating Provider/Extender: Frann Rider in Treatment: 14 Diagnosis Coding ICD-10 Codes Code Description E11.621 Type 2 diabetes mellitus with foot ulcer L89.512 Pressure ulcer of right ankle, stage 2 L89.521 Pressure ulcer of left ankle, stage 1 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 Facility Procedures CPT4 Code: [id number] Description: 99214 - WOUND CARE VISIT-LEV 4 EST PT Modifier: Quantity: 1 Physician Procedures CPT4 Code: [id number] Description: 66294 - WC PHYS LEVEL 3 - EST PT ICD-10 Description Diagnosis E11.621 Type 2 diabetes mellitus with foot ulcer L89.512 Pressure ulcer of right ankle, stage 2 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 Modifier: Quantity: 1 Electronic Signature(s) Signed: 06/16/2016 12:08:13 PM By: Christin Fudge MD, FACS Entered By: Christin Fudge on 06/16/2016 12:08:13

## 2016-06-19 ENCOUNTER — Inpatient Hospital Stay: Payer: Medicare Other | Attending: Hematology and Oncology

## 2016-06-19 ENCOUNTER — Inpatient Hospital Stay: Payer: Medicare Other

## 2016-06-19 ENCOUNTER — Encounter: Payer: Self-pay | Admitting: Oncology

## 2016-06-19 ENCOUNTER — Inpatient Hospital Stay (HOSPITAL_BASED_OUTPATIENT_CLINIC_OR_DEPARTMENT_OTHER): Payer: Medicare Other | Admitting: Oncology

## 2016-06-19 VITALS — BP 128/75 | HR 73 | Temp 96.5°F | Resp 18

## 2016-06-19 VITALS — BP 127/71 | HR 75 | Temp 96.6°F | Wt 131.1 lb

## 2016-06-19 DIAGNOSIS — D509 Iron deficiency anemia, unspecified: Secondary | ICD-10-CM

## 2016-06-19 DIAGNOSIS — R5383 Other fatigue: Secondary | ICD-10-CM | POA: Insufficient documentation

## 2016-06-19 DIAGNOSIS — Z8673 Personal history of transient ischemic attack (TIA), and cerebral infarction without residual deficits: Secondary | ICD-10-CM | POA: Diagnosis not present

## 2016-06-19 DIAGNOSIS — E039 Hypothyroidism, unspecified: Secondary | ICD-10-CM | POA: Diagnosis not present

## 2016-06-19 DIAGNOSIS — Z7982 Long term (current) use of aspirin: Secondary | ICD-10-CM

## 2016-06-19 DIAGNOSIS — M5134 Other intervertebral disc degeneration, thoracic region: Secondary | ICD-10-CM

## 2016-06-19 DIAGNOSIS — R490 Dysphonia: Secondary | ICD-10-CM | POA: Insufficient documentation

## 2016-06-19 DIAGNOSIS — Z801 Family history of malignant neoplasm of trachea, bronchus and lung: Secondary | ICD-10-CM | POA: Insufficient documentation

## 2016-06-19 DIAGNOSIS — I714 Abdominal aortic aneurysm, without rupture: Secondary | ICD-10-CM | POA: Diagnosis not present

## 2016-06-19 DIAGNOSIS — Z87891 Personal history of nicotine dependence: Secondary | ICD-10-CM

## 2016-06-19 DIAGNOSIS — K922 Gastrointestinal hemorrhage, unspecified: Secondary | ICD-10-CM

## 2016-06-19 DIAGNOSIS — L97319 Non-pressure chronic ulcer of right ankle with unspecified severity: Secondary | ICD-10-CM | POA: Diagnosis not present

## 2016-06-19 DIAGNOSIS — Z5111 Encounter for antineoplastic chemotherapy: Secondary | ICD-10-CM | POA: Insufficient documentation

## 2016-06-19 DIAGNOSIS — G629 Polyneuropathy, unspecified: Secondary | ICD-10-CM | POA: Diagnosis not present

## 2016-06-19 DIAGNOSIS — C155 Malignant neoplasm of lower third of esophagus: Secondary | ICD-10-CM

## 2016-06-19 DIAGNOSIS — R634 Abnormal weight loss: Secondary | ICD-10-CM | POA: Diagnosis not present

## 2016-06-19 DIAGNOSIS — C787 Secondary malignant neoplasm of liver and intrahepatic bile duct: Secondary | ICD-10-CM | POA: Insufficient documentation

## 2016-06-19 DIAGNOSIS — I517 Cardiomegaly: Secondary | ICD-10-CM

## 2016-06-19 DIAGNOSIS — Z79899 Other long term (current) drug therapy: Secondary | ICD-10-CM | POA: Diagnosis not present

## 2016-06-19 DIAGNOSIS — E119 Type 2 diabetes mellitus without complications: Secondary | ICD-10-CM | POA: Diagnosis not present

## 2016-06-19 DIAGNOSIS — R531 Weakness: Secondary | ICD-10-CM | POA: Insufficient documentation

## 2016-06-19 DIAGNOSIS — Z7984 Long term (current) use of oral hypoglycemic drugs: Secondary | ICD-10-CM | POA: Diagnosis not present

## 2016-06-19 DIAGNOSIS — G4730 Sleep apnea, unspecified: Secondary | ICD-10-CM | POA: Insufficient documentation

## 2016-06-19 DIAGNOSIS — I10 Essential (primary) hypertension: Secondary | ICD-10-CM | POA: Diagnosis not present

## 2016-06-19 DIAGNOSIS — R918 Other nonspecific abnormal finding of lung field: Secondary | ICD-10-CM | POA: Insufficient documentation

## 2016-06-19 LAB — COMPREHENSIVE METABOLIC PANEL
ALK PHOS: 67 U/L (ref 38–126)
ALT: 17 U/L (ref 17–63)
ANION GAP: 11 (ref 5–15)
AST: 33 U/L (ref 15–41)
Albumin: 3.8 g/dL (ref 3.5–5.0)
BUN: 15 mg/dL (ref 6–20)
CALCIUM: 9.6 mg/dL (ref 8.9–10.3)
CO2: 25 mmol/L (ref 22–32)
CREATININE: 0.71 mg/dL (ref 0.61–1.24)
Chloride: 95 mmol/L — ABNORMAL LOW (ref 101–111)
Glucose, Bld: 149 mg/dL — ABNORMAL HIGH (ref 65–99)
Potassium: 4.5 mmol/L (ref 3.5–5.1)
Sodium: 131 mmol/L — ABNORMAL LOW (ref 135–145)
Total Bilirubin: 0.4 mg/dL (ref 0.3–1.2)
Total Protein: 7.7 g/dL (ref 6.5–8.1)

## 2016-06-19 LAB — CBC WITH DIFFERENTIAL/PLATELET
Basophils Absolute: 0.1 10*3/uL (ref 0–0.1)
Basophils Relative: 1 %
EOS ABS: 0.1 10*3/uL (ref 0–0.7)
Eosinophils Relative: 1 %
HEMATOCRIT: 28.1 % — AB (ref 40.0–52.0)
HEMOGLOBIN: 9.6 g/dL — AB (ref 13.0–18.0)
LYMPHS ABS: 0.4 10*3/uL — AB (ref 1.0–3.6)
Lymphocytes Relative: 5 %
MCH: 29.1 pg (ref 26.0–34.0)
MCHC: 34.2 g/dL (ref 32.0–36.0)
MCV: 85 fL (ref 80.0–100.0)
MONOS PCT: 11 %
Monocytes Absolute: 1 10*3/uL (ref 0.2–1.0)
NEUTROS PCT: 82 %
Neutro Abs: 7.9 10*3/uL — ABNORMAL HIGH (ref 1.4–6.5)
PLATELETS: 519 10*3/uL — AB (ref 150–440)
RBC: 3.31 MIL/uL — ABNORMAL LOW (ref 4.40–5.90)
RDW: 21.8 % — ABNORMAL HIGH (ref 11.5–14.5)
WBC: 9.6 10*3/uL (ref 3.8–10.6)

## 2016-06-19 MED ORDER — SODIUM CHLORIDE 0.9 % IV SOLN
8.0000 mg/kg | Freq: Once | INTRAVENOUS | Status: AC
  Administered 2016-06-19: 500 mg via INTRAVENOUS
  Filled 2016-06-19: qty 50

## 2016-06-19 MED ORDER — SODIUM CHLORIDE 0.9% FLUSH
10.0000 mL | Freq: Once | INTRAVENOUS | Status: AC
  Administered 2016-06-19: 10 mL via INTRAVENOUS
  Filled 2016-06-19: qty 10

## 2016-06-19 MED ORDER — DEXAMETHASONE SODIUM PHOSPHATE 10 MG/ML IJ SOLN
10.0000 mg | Freq: Once | INTRAMUSCULAR | Status: AC
  Administered 2016-06-19: 10 mg via INTRAVENOUS
  Filled 2016-06-19: qty 1

## 2016-06-19 MED ORDER — FAMOTIDINE IN NACL 20-0.9 MG/50ML-% IV SOLN
20.0000 mg | Freq: Once | INTRAVENOUS | Status: AC
  Administered 2016-06-19: 20 mg via INTRAVENOUS
  Filled 2016-06-19: qty 50

## 2016-06-19 MED ORDER — SODIUM CHLORIDE 0.9 % IV SOLN: 10.0000 mg | Freq: Once | INTRAVENOUS | Status: DC

## 2016-06-19 MED ORDER — ACETAMINOPHEN 325 MG PO TABS
650.0000 mg | ORAL_TABLET | Freq: Once | ORAL | Status: AC
  Administered 2016-06-19: 650 mg via ORAL
  Filled 2016-06-19: qty 2

## 2016-06-19 MED ORDER — HEPARIN SOD (PORK) LOCK FLUSH 100 UNIT/ML IV SOLN: 500.0000 [IU] | Freq: Once | INTRAVENOUS | Status: DC | PRN

## 2016-06-19 MED ORDER — HEPARIN SOD (PORK) LOCK FLUSH 100 UNIT/ML IV SOLN
500.0000 [IU] | Freq: Once | INTRAVENOUS | Status: AC
  Administered 2016-06-19: 500 [IU] via INTRAVENOUS
  Filled 2016-06-19: qty 5

## 2016-06-19 MED ORDER — SODIUM CHLORIDE 0.9 % IV SOLN
Freq: Once | INTRAVENOUS | Status: AC
  Administered 2016-06-19: 12:00:00 via INTRAVENOUS
  Filled 2016-06-19: qty 1000

## 2016-06-19 MED ORDER — DIPHENHYDRAMINE HCL 50 MG/ML IJ SOLN
25.0000 mg | Freq: Once | INTRAMUSCULAR | Status: AC
Start: 2016-06-19 — End: 2016-06-19
  Administered 2016-06-19: 25 mg via INTRAVENOUS
  Filled 2016-06-19: qty 1

## 2016-06-19 NOTE — Telephone Encounter (Signed)
If pneumonia vaccine is due, please offer, bring him in [place] to postpone colonoscopy; he is battling stage IV esophageal cancer Thank you

## 2016-06-20 LAB — CEA: CEA: 639.9 ng/mL — AB (ref 0.0–4.7)

## 2016-06-20 LAB — CANCER ANTIGEN 19-9: CA 19-9: 26587 U/mL — ABNORMAL HIGH (ref 0–35)

## 2016-06-22 ENCOUNTER — Encounter: Payer: Self-pay | Admitting: Family Medicine

## 2016-06-23 ENCOUNTER — Encounter: Payer: Medicare Other | Admitting: Surgery

## 2016-06-23 DIAGNOSIS — L89513 Pressure ulcer of right ankle, stage 3: Secondary | ICD-10-CM | POA: Diagnosis not present

## 2016-06-23 DIAGNOSIS — D649 Anemia, unspecified: Secondary | ICD-10-CM | POA: Diagnosis not present

## 2016-06-23 DIAGNOSIS — L89512 Pressure ulcer of right ankle, stage 2: Secondary | ICD-10-CM | POA: Diagnosis not present

## 2016-06-23 DIAGNOSIS — L89521 Pressure ulcer of left ankle, stage 1: Secondary | ICD-10-CM | POA: Diagnosis not present

## 2016-06-23 DIAGNOSIS — E441 Mild protein-calorie malnutrition: Secondary | ICD-10-CM | POA: Diagnosis not present

## 2016-06-23 DIAGNOSIS — E11621 Type 2 diabetes mellitus with foot ulcer: Secondary | ICD-10-CM | POA: Diagnosis not present

## 2016-06-23 DIAGNOSIS — C155 Malignant neoplasm of lower third of esophagus: Secondary | ICD-10-CM | POA: Diagnosis not present

## 2016-06-25 NOTE — Progress Notes (Addendum)
KVON, MCILHENNY ([id number]) Visit Report for 06/23/2016 Arrival Information Details Patient Name: [name], [name] Date of Service: 06/23/2016 12:15 PM Medical Record Number: [id number] Patient Account Number: [id number] Date of Birth/Sex: 05-15-1940 (76 y.o. Male) Treating RN: Baruch Gouty, RN, BSN, Velva Harman Primary Care Gregrey Bloyd: [place] Derry Other Clinician: Referring Brave Dack: [place] Treating Lana Flaim/Extender: Frann Rider in Treatment: 15 Visit Information History Since Last Visit All ordered tests and consults were completed: No Patient Arrived: Ambulatory Added or deleted any medications: No Arrival Time: 12:15 Any new allergies or adverse reactions: No Accompanied By: wife Had a fall or experienced change in No Transfer Assistance: None activities of daily living that may affect Patient Identification Verified: Yes risk of falls: Secondary Verification Process Yes Signs or symptoms of abuse/neglect since last No Completed: visito Patient Requires Transmission- No Hospitalized since last visit: No Based Precautions: Has Dressing in Place as Prescribed: Yes Patient Has Alerts: Yes Pain Present Now: No Patient Alerts: Patient on Blood Thinner DM II 81mg  aspirin twice daily Electronic Signature(s) Signed: 06/23/2016 12:16:12 PM By: Regan Lemming BSN, RN Entered By: Regan Lemming on 06/23/2016 12:16:12 [name] ([id number]) -------------------------------------------------------------------------------- Encounter Discharge Information Details Patient Name: [name] Date of Service: 06/23/2016 12:15 PM Medical Record Number: [id number] Patient Account Number: [id number] Date of Birth/Sex: [birth year] (76 y.o. Male) Treating RN: Baruch Gouty, RN, BSN, Velva Harman Primary Care Naiah Donahoe: [place] Derry Other Clinician: Referring Natasia Sanko: [place] Derry Treating Sidonia Nutter/Extender: Frann Rider in Treatment: 15 Encounter Discharge Information Items Discharge Pain  Level: 0 Discharge Condition: Stable Ambulatory Status: Ambulatory Discharge Destination: Home Transportation: Private Auto Accompanied By: wife Schedule Follow-up Appointment: No Medication Reconciliation completed and provided to Patient/Care No Jordyan Hardiman: Provided on Clinical Summary of Care: 06/23/2016 Form Type Recipient Paper Patient FW Electronic Signature(s) Signed: 06/23/2016 4:39:11 PM By: Regan Lemming BSN, RN Previous Signature: 06/23/2016 12:48:56 PM Version By: Ruthine Dose Entered By: Regan Lemming on 06/23/2016 16:39:11 [name] ([id number]) -------------------------------------------------------------------------------- Lower Extremity Assessment Details Patient Name: [name] Date of Service: 06/23/2016 12:15 PM Medical Record Number: [id number] Patient Account Number: [id number] Date of Birth/Sex: 1940-02-14 (76 y.o. Male) Treating RN: Baruch Gouty, RN, BSN, Velva Harman Primary Care Ibraham Levi: [place] Derry Other Clinician: Referring Crandall Harvel: [place] D[place] Treating Kodi Guerrera/Extender: Frann Rider in Treatment: 15 Vascular Assessment Claudication: Claudication Assessment [Right:None] Pulses: Dorsalis Pedis Palpable: [Right:Yes] Posterior Tibial Extremity colors, hair growth, and conditions: Extremity Color: [Right:Normal] Hair Growth on Extremity: [Right:Yes] Temperature of Extremity: [Right:Warm] Toe Nail Assessment Left: Right: Thick: No Discolored: No Deformed: No Improper Length and Hygiene: No Electronic Signature(s) Signed: 06/23/2016 4:40:27 PM By: Regan Lemming BSN, RN Entered By: Regan Lemming on 06/23/2016 12:17:48 [name] ([id number]) -------------------------------------------------------------------------------- Multi Wound Chart Details Patient Name: [name] Date of Service: 06/23/2016 12:15 PM Medical Record Number: [id number] Patient Account Number: [id number] Date of Birth/Sex: 12/14/1940 (76 y.o. Male) Treating RN:  Baruch Gouty, RN, BSN, Velva Harman Primary Care Alonah Lineback: En[place] Derry Other Clinician: Referring Deamber Buckhalter: [place] Derry Treating Arnav Cregg/Extender: Frann Rider in Treatment: 15 Vital Signs Height(in): 67 Pulse(bpm): 66 Weight(lbs): 136 Blood Pressure 130/53 (mmHg): Body Mass Index(BMI): 21 Temperature(F): 97.7 Respiratory Rate 16 (breaths/min): Photos: [1:No Photos] [2:No Photos] [3:No Photos] Wound Location: [1:Right Malleolus - Lateral] [2:Right Foot - Dorsal] [3:Right Foot - Plantar] Wounding Event: [1:Gradually Appeared] [2:Gradually Appeared] [3:Pressure Injury] Primary Etiology: [1: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] [3:Diabetic Wound/Ulcer of the Lower Extremity] Secondary Etiology: [1:Diabetic Wound/Ulcer of the Lower Extremity] [2:N/A] [3:N/A] Comorbid History: [1:Cataracts, Chronic sinus problems/congestion, Anemia,  Sleep Apnea, Hypertension, Myocardial Infarction, Type II Diabetes, Received Chemotherapy, Received Radiation] [2:Cataracts, Chronic sinus problems/congestion, Anemia, Sleep  Apnea, Hypertension, Myocardial Infarction, Type II Diabetes, Received Chemotherapy, Received Radiation] [3:Cataracts, Chronic sinus problems/congestion, Anemia, Sleep Apnea, Hypertension, Myocardial Infarction, Type II Diabetes, Received Chemotherapy,  Received Radiation] Date Acquired: [1:01/09/2016] [2:06/02/2016] [3:06/15/2016] Weeks of Treatment: [1:15] [2:2] [3:1] Wound Status: [1:Open] [2:Open] [3:Open] Measurements L x W x D 0.8x0.8x0.4 [2:0.4x0.4x0.1] [3:0.6x0.7x0.1] (cm) Area (cm) : [1:0.503] [2:0.126] [3:0.33] Volume (cm) : [1:0.201] [2:0.013] [3:0.033] % Reduction in Area: [1:-113.10%] [2:35.70%] [3:81.30%] % Reduction in Volume: -737.50% [2:66.70%] [3:98.80%] Classification: [1:Category/Stage III] [2:Grade 1] [3:Grade 1] HBO Classification: [1:Grade 1] [2:N/A] [3:N/A] Exudate Amount: [1:Medium] [2:Medium] [3:Medium] Exudate Type: [1:Serous]  [2:Serosanguineous] [3:Serosanguineous] Exudate Color: [1:amber] [2:red, brown] [3:red, brown] Wound Margin: [1:Flat and Intact] [2:Flat and Intact] [3:Distinct, outline attached] Granulation Amount: Small (1-33%) None Present (0%) Medium (34-66%) Granulation Quality: Pink, Pale N/A Pink, Pale Necrotic Amount: Large (67-100%) Large (67-100%) Medium (34-66%) Exposed Structures: Fat Layer (Subcutaneous Fat Layer (Subcutaneous Fat Layer (Subcutaneous Tissue) Exposed: Yes Tissue) Exposed: Yes Tissue) Exposed: Yes Fascia: No Fascia: No Fascia: No Tendon: No Tendon: No Tendon: No Muscle: No Muscle: No Muscle: No Joint: No Joint: No Joint: No Bone: No Bone: No Bone: No Epithelialization: None None None Debridement: Debridement ([id number] N/A N/A [id number]) Pre-procedure 12:31 N/A N/A Verification/Time Out Taken: Pain Control: Lidocaine 4% Topical N/A N/A Solution Tissue Debrided: Fibrin/Slough, N/A N/A Subcutaneous Level: Skin/Subcutaneous N/A N/A Tissue Debridement Area (sq 0.64 N/A N/A cm): Instrument: Curette N/A N/A Bleeding: Minimum N/A N/A Hemostasis Achieved: Pressure N/A N/A Procedural Pain: 0 N/A N/A Post Procedural Pain: 0 N/A N/A Debridement Treatment Procedure was tolerated N/A N/A Response: well Post Debridement 0.8x0.8x0.4 N/A N/A Measurements L x W x D (cm) Post Debridement 0.201 N/A N/A Volume: (cm) Post Debridement Category/Stage III N/A N/A Stage: Periwound Skin Texture: Induration: Yes Excoriation: No Callus: Yes Induration: No Excoriation: No Callus: No Induration: No Crepitus: No Crepitus: No Rash: No Rash: No Scarring: No Scarring: No Periwound Skin Maceration: No Maceration: No Maceration: No Moisture: Dry/Scaly: No Dry/Scaly: No Dry/Scaly: No Periwound Skin Color: Erythema: Yes Atrophie Blanche: No Atrophie Blanche: No Cyanosis: No Cyanosis: No Ecchymosis: No Ecchymosis: No Erythema: No Erythema: No [name], [name]  ([id number]) Hemosiderin Staining: No Hemosiderin Staining: No Mottled: No Mottled: No Pallor: No Pallor: No Rubor: No Rubor: No Erythema Location: Circumferential N/A N/A Erythema Change: Decreased N/A N/A Temperature: No Abnormality No Abnormality No Abnormality Tenderness on Yes Yes No Palpation: Wound Preparation: Ulcer Cleansing: Ulcer Cleansing: Ulcer Cleansing: Rinsed/Irrigated with Rinsed/Irrigated with Rinsed/Irrigated with Saline Saline Saline Topical Anesthetic Topical Anesthetic Topical Anesthetic Applied: Other: lidocaine Applied: Other: lidocaine Applied: Other: lidocaine 4% 4% 4% Procedures Performed: Debridement N/A N/A Treatment Notes Electronic Signature(s) Signed: 06/23/2016 12:37:22 PM By: Christin Fudge MD, FACS Entered By: Christin Fudge on 06/23/2016 12:37:22 [name] ([id number]) -------------------------------------------------------------------------------- [place] Details Patient Name: [name], [name] Date of Service: 06/23/2016 12:15 PM Medical Record Number: [id number] Patient Account Number: [id number] Date of Birth/Sex: 29-May-1940 (76 y.o. Male) Treating RN: Baruch Gouty, RN, BSN, Velva Harman Primary Care Chevelle Durr: En[place] Derry Other Clinician: Referring Lyndee Herbst: En[place] Derry Treating Lavayah Vita/Extender: Frann Rider in Treatment: 15 Active Inactive ` Orientation to the Wound Care Program Nursing Diagnoses: Knowledge deficit related to the wound healing center program Goals: Patient/caregiver will verbalize understanding of the [facility] Date Initiated: 03/10/2016 Target Resolution Date: 03/23/2016 Goal Status: Active Interventions: Provide education on orientation to the wound  center Notes: ` Soft Tissue Infection Nursing Diagnoses: Impaired tissue integrity Potential for infection: soft tissue Goals: Patient will remain free of wound infection Date Initiated: 03/10/2016 Target Resolution  Date: 03/17/2016 Goal Status: Active Interventions: Assess signs and symptoms of infection every visit Notes: ` Wound/Skin Impairment Nursing Diagnoses: Impaired tissue integrity [name], [name] ([id number]) Goals: Ulcer/skin breakdown will heal within 14 weeks Date Initiated: 03/10/2016 Target Resolution Date: 03/24/2016 Goal Status: Active Interventions: Assess patient/caregiver ability to obtain necessary supplies Notes: Electronic Signature(s) Signed: 06/23/2016 4:40:27 PM By: Regan Lemming BSN, RN Entered By: Regan Lemming on 06/23/2016 12:32:25 [name] ([id number]) -------------------------------------------------------------------------------- Pain Assessment Details Patient Name: [name] Date of Service: 06/23/2016 12:15 PM Medical Record Number: [id number] Patient Account Number: [id number] Date of Birth/Sex: [birth year] (76 y.o. Male) Treating RN: Baruch Gouty, RN, BSN, Velva Harman Primary Care Ruchy Wildrick: [place] Derry Other Clinician: Referring Story Conti: [place] Derry Treating Jammi Morrissette/Extender: Frann Rider in Treatment: 15 Active Problems Location of Pain Severity and Description of Pain Patient Has Paino No Site Locations With Dressing Change: No Pain Management and Medication Current Pain Management: Electronic Signature(s) Signed: 06/23/2016 12:16:19 PM By: Regan Lemming BSN, RN Entered By: Regan Lemming on 06/23/2016 12:16:19 [name] ([id number]) -------------------------------------------------------------------------------- Patient/Caregiver Education Details Patient Name: [name] Date of Service: 06/23/2016 12:15 PM Medical Record Number: [id number] Patient Account Number: [id number] Date of Birth/Gender: Nov 02, 1940 (76 y.o. Male) Treating RN: Baruch Gouty, RN, BSN, Velva Harman Primary Care Physician: En[place] Derry Other Clinician: Referring Physician: Enid Derry Treating Physician/Extender: Frann Rider in Treatment: 15 Education  Assessment Education Provided To: Patient Education Topics Provided Welcome To [facility]: Methods: Explain/Verbal Responses: State content correctly Wound Debridement: Methods: Explain/Verbal Responses: State content correctly Wound/Skin Impairment: Methods: Explain/Verbal Responses: State content correctly Electronic Signature(s) Signed: 06/23/2016 4:40:27 PM By: Regan Lemming BSN, RN Entered By: Regan Lemming on 06/23/2016 16:39:28 [name] ([id number]) -------------------------------------------------------------------------------- Wound Assessment Details Patient Name: [name] Date of Service: 06/23/2016 12:15 PM Medical Record Number: [id number] Patient Account Number: [id number] Date of Birth/Sex: 12/26/1940 (76 y.o. Male) Treating RN: Baruch Gouty, RN, BSN, [place] Primary Care Trinity Hyland: Enid Derry Other Clinician: Referring Willadene Mounsey: Enid Derry Treating Donnamarie Shankles/Extender: Frann Rider in Treatment: 15 Wound Status Wound Number: 1 Primary Pressure Ulcer Etiology: Wound Location: Right Malleolus - Lateral Secondary Diabetic Wound/Ulcer of the Lower Wounding Event: Gradually Appeared Etiology: Extremity Date Acquired: 01/09/2016 Wound Open Weeks Of Treatment: 15 Status: Clustered Wound: No Comorbid Cataracts, Chronic sinus History: problems/congestion, Anemia, Sleep Apnea, Hypertension, Myocardial Infarction, Type II Diabetes, Received Chemotherapy, Received Radiation Photos Photo Uploaded By: Regan Lemming on 06/23/2016 16:29:11 Wound Measurements Length: (cm) 0.8 Width: (cm) 0.8 Depth: (cm) 0.4 Area: (cm) 0.503 Volume: (cm) 0.201 % Reduction in Area: -113.1% % Reduction in Volume: -737.5% Epithelialization: None Tunneling: No Undermining: No Wound Description 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 Severity (Wagner): Grade 1 Slough/Fibrin Wound Margin: Flat and Intact Exudate Amount: Medium Exudate Type:  Serous Exudate Color: amber [name], [name] ([id number]) er Cleansing: No o Yes Wound Bed Granulation Amount: Small (1-33%) Exposed Structure Granulation Quality: Pink, Pale Fascia Exposed: No Necrotic Amount: Large (67-100%) Fat Layer (Subcutaneous Tissue) Exposed: Yes Necrotic Quality: Adherent Slough Tendon Exposed: No Muscle Exposed: No Joint Exposed: No Bone Exposed: No Periwound Skin Texture Texture Color No Abnormalities Noted: No No Abnormalities Noted: No Induration: Yes Erythema: Yes Erythema Location: Circumferential Moisture Erythema Change: Decreased No Abnormalities Noted: No Dry / Scaly: No Temperature / Pain Maceration: No Temperature: No Abnormality  Tenderness on Palpation: Yes Wound Preparation Ulcer Cleansing: Rinsed/Irrigated with Saline Topical Anesthetic Applied: Other: lidocaine 4%, Treatment Notes Wound #1 (Right, Lateral Malleolus) 1. Cleansed with: Clean wound with Normal Saline 4. Dressing Applied: Aquacel Ag Other dressing (specify in notes) 5. Secondary Dressing Applied Bordered Foam Dressing Dry Gauze 7. Secured with Tape Notes aguacel to plantar Electronic Signature(s) Signed: 06/23/2016 4:40:27 PM By: Regan Lemming BSN, RN Entered By: Regan Lemming on 06/23/2016 12:27:25 [name] ([id number]) -------------------------------------------------------------------------------- Wound Assessment Details Patient Name: [name] Date of Service: 06/23/2016 12:15 PM Medical Record Number: [id number] Patient Account Number: [id number] Date of Birth/Sex: 06-13-1940 (76 y.o. Male) Treating RN: Baruch Gouty, RN, BSN, [place] Primary Care Remedy Corporan: Enid Derry Other Clinician: Referring Jahanna Raether: [place] Derry Treating Jamarien Rodkey/Extender: Frann Rider in Treatment: 15 Wound Status Wound Number: 2 Primary Diabetic Wound/Ulcer of the Lower Etiology: Extremity Wound Location: Right Foot - Dorsal Wound Open Wounding Event:  Gradually Appeared Status: Date Acquired: 06/02/2016 Comorbid Cataracts, Chronic sinus Weeks Of Treatment: 2 History: problems/congestion, Anemia, Sleep Clustered Wound: No Apnea, Hypertension, Myocardial Infarction, Type II Diabetes, Received Chemotherapy, Received Radiation Photos Photo Uploaded By: Regan Lemming on 06/23/2016 16:29:12 Wound Measurements Length: (cm) 0.4 Width: (cm) 0.4 Depth: (cm) 0.1 Area: (cm) 0.126 Volume: (cm) 0.013 % Reduction in Area: 35.7% % Reduction in Volume: 66.7% Epithelialization: None Tunneling: No Undermining: No Wound Description Classification: Grade 1 Foul Odor Aft Wound Margin: Flat and Intact Slough/Fibrin Exudate Amount: Medium Exudate Type: Serosanguineous Exudate Color: red, brown er Cleansing: No o Yes Wound Bed Granulation Amount: None Present (0%) Exposed Structure Necrotic Amount: Large (67-100%) Fascia Exposed: No [name], [name] ([id number]) Necrotic Quality: Adherent Slough Fat Layer (Subcutaneous Tissue) Exposed: Yes Tendon Exposed: No Muscle Exposed: No Joint Exposed: No Bone Exposed: No Periwound Skin Texture Texture Color No Abnormalities Noted: No No Abnormalities Noted: No Callus: No Atrophie Blanche: No Crepitus: No Cyanosis: No Excoriation: No Ecchymosis: No Induration: No Erythema: No Rash: No Hemosiderin Staining: No Scarring: No Mottled: No Pallor: No Moisture Rubor: No No Abnormalities Noted: No Dry / Scaly: No Temperature / Pain Maceration: No Temperature: No Abnormality Tenderness on Palpation: Yes Wound Preparation Ulcer Cleansing: Rinsed/Irrigated with Saline Topical Anesthetic Applied: Other: lidocaine 4%, Treatment Notes Wound #2 (Right, Dorsal Foot) 1. Cleansed with: Clean wound with Normal Saline 4. Dressing Applied: Aquacel Ag Other dressing (specify in notes) 5. Secondary Dressing Applied Bordered Foam Dressing Dry Gauze 7. Secured with Tape Notes aguacel to  plantar Electronic Signature(s) Signed: 06/23/2016 4:40:27 PM By: Regan Lemming BSN, RN Entered By: Regan Lemming on 06/23/2016 12:31:19 [name] ([id number]) -------------------------------------------------------------------------------- Wound Assessment Details Patient Name: [name] Date of Service: 06/23/2016 12:15 PM Medical Record Number: [id number] Patient Account Number: [id number] Date of Birth/Sex: 07-26-1940 (76 y.o. Male) Treating RN: Baruch Gouty, RN, BSN, [place] Primary Care Erek Kowal: En[place] Derry Other Clinician: Referring Aser Nylund: [place] Derry Treating Teea Ducey/Extender: Frann Rider in Treatment: 15 Wound Status Wound Number: 3 Primary Diabetic Wound/Ulcer of the Lower Etiology: Extremity Wound Location: Right Foot - Plantar Wound Open Wounding Event: Pressure Injury Status: Date Acquired: 06/15/2016 Comorbid Cataracts, Chronic sinus Weeks Of Treatment: 1 History: problems/congestion, Anemia, Sleep Clustered Wound: No Apnea, Hypertension, Myocardial Infarction, Type II Diabetes, Received Chemotherapy, Received Radiation Photos Photo Uploaded By: Regan Lemming on 06/23/2016 16:30:57 Wound Measurements Length: (cm) 0.6 Width: (cm) 0.7 Depth: (cm) 0.1 Area: (cm) 0.33 Volume: (cm) 0.033 % Reduction in Area: 81.3% % Reduction in Volume: 98.8% Epithelialization: None Tunneling: No Undermining: No Wound  Description Classification: Grade 1 Foul Odor Aft Wound Margin: Distinct, outline attached Slough/Fibrin Exudate Amount: Medium Exudate Type: Serosanguineous Exudate Color: red, brown er Cleansing: No o Yes Wound Bed Granulation Amount: Medium (34-66%) Exposed Structure Granulation Quality: Pink, Pale Fascia Exposed: No [name], [name] ([id number]) Necrotic Amount: Medium (34-66%) Fat Layer (Subcutaneous Tissue) Exposed: Yes Necrotic Quality: Adherent Slough Tendon Exposed: No Muscle Exposed: No Joint Exposed: No Bone Exposed:  No Periwound Skin Texture Texture Color No Abnormalities Noted: No No Abnormalities Noted: No Callus: Yes Atrophie Blanche: No Crepitus: No Cyanosis: No Excoriation: No Ecchymosis: No Induration: No Erythema: No Rash: No Hemosiderin Staining: No Scarring: No Mottled: No Pallor: No Moisture Rubor: No No Abnormalities Noted: No Dry / Scaly: No Temperature / Pain Maceration: No Temperature: No Abnormality Wound Preparation Ulcer Cleansing: Rinsed/Irrigated with Saline Topical Anesthetic Applied: Other: lidocaine 4%, Treatment Notes Wound #3 (Right, Plantar Foot) 1. Cleansed with: Clean wound with Normal Saline 4. Dressing Applied: Aquacel Ag Other dressing (specify in notes) 5. Secondary Dressing Applied Bordered Foam Dressing Dry Gauze 7. Secured with Tape Notes aguacel to plantar Electronic Signature(s) Signed: 06/23/2016 4:40:27 PM By: Regan Lemming BSN, RN Entered By: Regan Lemming on 06/23/2016 12:31:57 [name] ([id number]) -------------------------------------------------------------------------------- [place] Details Patient Name: [name] Date of Service: 06/23/2016 12:15 PM Medical Record Number: [id number] Patient Account Number: [id number] Date of Birth/Sex: Oct 26, 1940 (76 y.o. Male) Treating RN: Baruch Gouty, RN, BSN, [place] Primary Care Corde Antonini: En[place] Derry Other Clinician: Referring Sophiarose Eades: [place] Derry Treating Fenna Semel/Extender: Frann Rider in Treatment: 15 Vital Signs Time Taken: 12:20 Temperature (F): 97.7 Height (in): 67 Pulse (bpm): 66 Weight (lbs): 136 Respiratory Rate (breaths/min): 16 Body Mass Index (BMI): 21.3 Blood Pressure (mmHg): 130/53 Reference Range: 80 - 120 mg / dl Electronic Signature(s) Signed: 06/23/2016 4:40:27 PM By: Regan Lemming BSN, RN Entered By: Regan Lemming on 06/23/2016 12:20:36

## 2016-06-25 NOTE — Progress Notes (Signed)
TREMAINE, EARWOOD ([id number]) Visit Report for 06/23/2016 Chief Complaint Document Details Patient Name: [name], [name]. Date of Service: 06/23/2016 12:15 PM Medical Record Number: [id number] Patient Account Number: [id number] Date of Birth/Sex: 05-Nov-1940 (76 y.o. Male) Treating RN: Baruch Gouty, RN, BSN, Velva Harman Primary Care Provider: Enid Derry Other Clinician: Referring Provider: Enid Derry Treating Provider/Extender: Frann Rider in Treatment: 15 Information Obtained from: Patient Chief Complaint Patient is at the clinic for treatment of an open pressure ulcer to the right lateral ankle which she's had for about 2 months Electronic Signature(s) Signed: 06/23/2016 12:37:41 PM By: Christin Fudge MD, FACS Entered By: Christin Fudge on 06/23/2016 12:37:41 [name] ([id number]) -------------------------------------------------------------------------------- Debridement Details Patient Name: [name] Date of Service: 06/23/2016 12:15 PM Medical Record Number: [id number] Patient Account Number: [id number] Date of Birth/Sex: [birth year] (76 y.o. Male) Treating RN: Baruch Gouty, RN, BSN, [place] Primary Care Provider: Enid Derry Other Clinician: Referring Provider: Enid Derry Treating Provider/Extender: Frann Rider in Treatment: 15 Debridement Performed for Wound #1 Right,Lateral Malleolus Assessment: Performed By: Physician Christin Fudge, MD Debridement: Debridement Pre-procedure Yes - 12:31 Verification/Time Out Taken: Start Time: 12:31 Pain Control: Lidocaine 4% Topical Solution Level: Skin/Subcutaneous Tissue Total Area Debrided (L x 0.8 (cm) x 0.8 (cm) = 0.64 (cm) W): Tissue and other Non-Viable, Fibrin/Slough, Subcutaneous material debrided: Instrument: Curette Bleeding: Minimum Hemostasis Achieved: Pressure End Time: 12:33 Procedural Pain: 0 Post Procedural Pain: 0 Response to Treatment: Procedure was tolerated well Post Debridement Measurements  of Total Wound Length: (cm) 0.8 Stage: Category/Stage III Width: (cm) 0.8 Depth: (cm) 0.4 Volume: (cm) 0.201 Character of Wound/Ulcer Post Requires Further Debridement: Debridement Severity of Tissue Post Fat layer exposed Debridement: Post Procedure Diagnosis Same as Pre-procedure Electronic Signature(s) Signed: 06/23/2016 12:37:30 PM By: Christin Fudge MD, FACS Signed: 06/23/2016 4:40:27 PM By: Regan Lemming BSN, RN Entered By: Christin Fudge on 06/23/2016 12:37:30 [name] ([id number]) MARTEL, GALVAN ([id number]) -------------------------------------------------------------------------------- HPI Details Patient Name: STANTON, KISSOON. Date of Service: 06/23/2016 12:15 PM Medical Record Number: [id number] Patient Account Number: [id number] Date of Birth/Sex: [birth year] (76 y.o. Male) Treating RN: Baruch Gouty, RN, BSN, Velva Harman Primary Care Provider: Enid Derry Other Clinician: Referring Provider: Enid Derry Treating Provider/Extender: Frann Rider in Treatment: 15 History of Present Illness Location: Patient presents with an ulcer on the right lateral ankle. Quality: Patient reports experiencing a dull pain to affected area(s). Severity: Patient states wound are getting worse. Duration: Patient has had the wound for > 2 months prior to seeking treatment at the wound center Timing: Pain in wound is Intermittent (comes and goes Context: The wound appeared gradually over time Modifying Factors: Other treatment(s) tried include:local care as per the medical oncologist Associated Signs and Symptoms: Patient reports having:some pain and no discharge HPI Description: 76 year old patient has been referred by his medical oncologist Dr. Grayland Ormond, for a right lateral malleolus ulcer has been there for several weeks. 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 He is currently on chemotherapy with FOLFOX and Herceptin. past  medical history significant for diabetes mellitus, anemia, hypertension, sleep apnea, status post appendectomy, peripheral vascular catheterization( Port placement) in September 2017 by Dr. Delana Meyer. He is also receiving palliative radiation therapy and was seen by Dr. Donella Stade. 03/17/2016 -- - x-ray of the right ankle -- IMPRESSION: 1. Soft tissue wound noted over the lateral malleolus, no underlying bony abnormality. No acute bony abnormality. 2. Diffuse degenerative change. 3. Peripheral vascular disease. 05/05/2016 -- the  patient's wife tells me that she has noticed a small superficial bruise on the left lateral ankle and wanted me to view this area. 05/12/2016 -- the patient is unable to afford Santyl ointment and we have been struggling over the last 8 weeks to use various alternatives. I have asked them today if they would be willing to use the snap vacuum system and she will let me know soon. 05/19/2016 -- the patient and the wife have declined the use of the snap the vacuum system. He does not want any debridement to be done today. His proteins have improved a bit. 06/08/2016 -- . Not seen the patient back for 3 weeks and in the meanwhile the wife has decided to use duoderm on the wound,-- given to her by a friend. The wound is macerated and has increased in size due to the moisture. 06/16/2016 -- besides the original wound he had on his right lateral malleolus he has a small area on the dorsum of his right foot which is possibly cause due to pressure. He now has a plantar wound on the first metatarsal head on the right foot where his podiatrist Dr. Vickki Muff had debrided a callus yesterday. Electronic Signature(s) Signed: 06/23/2016 12:37:47 PM By: Christin Fudge MD, FACS Entered By: Christin Fudge on 06/23/2016 12:37:47 [name] ([id number], MARCZAK ([id number]) -------------------------------------------------------------------------------- Physical Exam Details Patient  Name: VENTURA, HOLLENBECK Date of Service: 06/23/2016 12:15 PM Medical Record Number: [id number] Patient Account Number: [id number] Date of Birth/Sex: 1941/01/20 (76 y.o. Male) Treating RN: Baruch Gouty, RN, BSN, Velva Harman Primary Care Provider: Enid Derry Other Clinician: Referring Provider: Enid Derry Treating Provider/Extender: Frann Rider in Treatment: 15 Constitutional . Pulse regular. Respirations normal and unlabored. Afebrile. . Eyes Nonicteric. Reactive to light. Ears, Nose, Mouth, and Throat Lips, teeth, and gums WNL.Marland Kitchen Moist mucosa without lesions. Neck supple and nontender. No palpable supraclavicular or cervical adenopathy. Normal sized without goiter. Respiratory WNL. No retractions.. Breath sounds WNL, No rubs, rales, rhonchi, or wheeze.. Cardiovascular Heart rhythm and rate regular, no murmur or gallop.. Pedal Pulses WNL. No clubbing, cyanosis or edema. Chest Breasts symmetical and no nipple discharge.. Breast tissue WNL, no masses, lumps, or tenderness.. Gastrointestinal (GI) Abdomen without masses or tenderness.. No liver or spleen enlargement or tenderness.. Genitourinary (GU) No hydrocele, spermatocele, tenderness of the cord, or testicular mass.Marland Kitchen Penis without lesions.Lowella Fairy without lesions. No cystocele, or rectocele. Pelvic support intact, no discharge.Marland Kitchen Urethra without masses, tenderness or scarring.Marland Kitchen Lymphatic No adneopathy. No adenopathy. No adenopathy. Musculoskeletal Adexa without tenderness or enlargement.. Digits and nails w/o clubbing, cyanosis, infection, petechiae, ischemia, or inflammatory conditions.. Integumentary (Hair, Skin) No suspicious lesions. No crepitus or fluctuance. No peri-wound warmth or erythema. No masses.Marland Kitchen Psychiatric Judgement and insight Intact.. No evidence of depression, anxiety, or agitation.. Notes the right lateral malleolar wound required sharp debridement today and I'm done this with the #3 curet and there is some  granulation tissue below this. The wound on the dorsum of the foot did not need sharp debridement today. The wound on the right plantar aspect of the foot looks clean and no sharp debridement was required today. JERIMIAH, WOLMAN ([id number]) Electronic Signature(s) Signed: 06/23/2016 12:39:01 PM By: Christin Fudge MD, FACS Entered By: Christin Fudge on 06/23/2016 12:39:00 [name] ([id number]) -------------------------------------------------------------------------------- Physician Orders Details Patient Name: RYAN, OGBORN Date of Service: 06/23/2016 12:15 PM Medical Record Number: [id number] Patient Account Number: [id number] Date of Birth/Sex: 1940-12-08 (76 y.o. Male) Treating RN: Baruch Gouty, RN, BSN, [employer]  Care Provider: Enid Derry Other Clinician: Referring Provider: Enid Derry Treating Provider/Extender: Frann Rider in Treatment: 15 Verbal / Phone Orders: No Diagnosis Coding Wound Cleansing Wound #1 Right,Lateral Malleolus o Clean wound with Normal Saline. o Cleanse wound with mild soap and water Wound #2 Right,Dorsal Foot o Clean wound with Normal Saline. o Cleanse wound with mild soap and water Wound #3 Right,Plantar Foot o Clean wound with Normal Saline. o Cleanse wound with mild soap and water Anesthetic Wound #1 Right,Lateral Malleolus o Topical Lidocaine 4% cream applied to wound bed prior to debridement Wound #2 Right,Dorsal Foot o Topical Lidocaine 4% cream applied to wound bed prior to debridement Wound #3 Right,Plantar Foot o Topical Lidocaine 4% cream applied to wound bed prior to debridement Skin Barriers/Peri-Wound Care Wound #1 Right,Lateral Malleolus o Antifungal cream - LOTRISONSE.Marland Kitchenapply around wound on reddened areas as needed Wound #2 Right,Dorsal Foot o Antifungal cream - LOTRISONSE.Marland Kitchenapply around wound on reddened areas as needed Primary Wound Dressing Wound #1 Right,Lateral Malleolus o Santyl Ointment  - only in clinic o Hydrogel - similar to [place] jelly over the counter o Cutimed Sorbact - cut small piece and insert in wound Wound #2 Right,Dorsal Foot IZZY, [place] ([id number]) o Santyl Ointment - only in clinic o Hydrogel - similar to [place] jelly over the counter o Cutimed Sorbact - cut small piece and insert in wound Wound #3 Right,Plantar Foot o Aquacel Ag Secondary Dressing Wound #1 Right,Lateral Malleolus o Dry Gauze o Boardered Foam Dressing Wound #2 Right,Dorsal Foot o Dry Gauze o Boardered Foam Dressing Wound #3 Right,Plantar Foot o Dry Gauze o Boardered Foam Dressing Dressing Change Frequency Wound #1 Right,Lateral Malleolus o Change dressing every day. Wound #2 Right,Dorsal Foot o Change dressing every day. Follow-up Appointments Wound #1 Right,Lateral Malleolus o Return Appointment in 1 week. Wound #2 Right,Dorsal Foot o Return Appointment in 1 week. Edema Control Wound #1 Right,Lateral Malleolus o Elevate legs to the level of the heart and pump ankles as often as possible Wound #2 Right,Dorsal Foot o Elevate legs to the level of the heart and pump ankles as often as possible Off-Loading Wound #1 Right,Lateral Malleolus o Other: - Keep pressure off the malleolus, FELT Wound #3 Right,Plantar Foot o Other: - Keep pressure off the malleolus, JACE, FERMIN ([id number]) Wound #2 Right,Dorsal Foot o Other: - Keep pressure off the malleolus Additional Orders / Instructions Wound #1 Right,Lateral Malleolus o Increase protein intake. o Activity as tolerated Wound #2 Right,Dorsal Foot o Increase protein intake. o Activity as tolerated Medications-please add to medication list. Wound #1 Right,Lateral Malleolus o Other: - Include these in your diet...VITAMIN A, C, ZINC, MVI Electronic Signature(s) Signed: 06/23/2016 3:55:17 PM By: Christin Fudge MD, FACS Signed: 06/23/2016 4:40:27 PM By: Regan Lemming BSN,  RN Entered By: Regan Lemming on 06/23/2016 12:35:01 [name] ([id number]) -------------------------------------------------------------------------------- Problem List Details Patient Name: OCTAVION, MOLLENKOPF Date of Service: 06/23/2016 12:15 PM Medical Record Number: [id number] Patient Account Number: [id number] Date of Birth/Sex: [birth year] (76 y.o. Male) Treating RN: Baruch Gouty, RN, BSN, Velva Harman Primary Care Provider: Enid Derry Other Clinician: Referring Provider: Enid Derry Treating Provider/Extender: Frann Rider in Treatment: 15 Active Problems ICD-10 Encounter Code Description Active Date Diagnosis E11.621 Type 2 diabetes mellitus with foot ulcer 05/05/2016 Yes L89.512 Pressure ulcer of right ankle, stage 2 05/05/2016 Yes L89.521 Pressure ulcer of left ankle, stage 1 05/05/2016 Yes 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 05/05/2016 Yes Inactive Problems Resolved Problems Electronic Signature(s) Signed: 06/23/2016 12:37:18 PM By: Con Memos,  Roderick Pee MD, FACS Entered By: Christin Fudge on 06/23/2016 12:37:18 [name] ([id number]) -------------------------------------------------------------------------------- Progress Note Details Patient Name: SHAUN, RUNYON Date of Service: 06/23/2016 12:15 PM Medical Record Number: [id number] Patient Account Number: [id number] Date of Birth/Sex: 1940/03/17 (76 y.o. Male) Treating RN: Baruch Gouty, RN, BSN, Velva Harman Primary Care Provider: Enid Derry Other Clinician: Referring Provider: Enid Derry Treating Provider/Extender: Frann Rider in Treatment: 15 Subjective Chief Complaint Information obtained from Patient Patient is at the clinic for treatment of an open pressure ulcer to the right lateral ankle which she's had for about 2 months History of Present Illness (HPI) The following HPI elements were documented for the patient's wound: Location: Patient presents with an ulcer on the right lateral ankle. Quality: Patient  reports experiencing a dull pain to affected area(s). Severity: Patient states wound are getting worse. Duration: Patient has had the wound for > 2 months prior to seeking treatment at the wound center Timing: Pain in wound is Intermittent (comes and goes Context: The wound appeared gradually over time Modifying Factors: Other treatment(s) tried include:local care as per the medical oncologist Associated Signs and Symptoms: Patient reports having:some pain and no discharge 76 year old patient has been referred by his medical oncologist Dr. Grayland Ormond, for a right lateral malleolus ulcer has been there for several weeks. 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 He is currently on chemotherapy with FOLFOX and Herceptin. past medical history significant for diabetes mellitus, anemia, hypertension, sleep apnea, status post appendectomy, peripheral vascular catheterization( Port placement) in September 2017 by Dr. Delana Meyer. He is also receiving palliative radiation therapy and was seen by Dr. Donella Stade. 03/17/2016 -- - x-ray of the right ankle -- IMPRESSION: 1. Soft tissue wound noted over the lateral malleolus, no underlying bony abnormality. No acute bony abnormality. 2. Diffuse degenerative change. 3. Peripheral vascular disease. 05/05/2016 -- the patient's wife tells me that she has noticed a small superficial bruise on the left lateral ankle and wanted me to view this area. 05/12/2016 -- the patient is unable to afford Santyl ointment and we have been struggling over the last 8 weeks to use various alternatives. I have asked them today if they would be willing to use the snap vacuum system and she will let me know soon. 05/19/2016 -- the patient and the wife have declined the use of the snap the vacuum system. He does not want any debridement to be done today. His proteins have improved a bit. 06/08/2016 -- . Not seen the patient back for 3  weeks and in the meanwhile the wife has decided to use duoderm on the wound,-- given to her by a friend. The wound is macerated and has increased in size due to the moisture. HAIDEN, RAWLINSON ([id number]) 06/16/2016 -- besides the original wound he had on his right lateral malleolus he has a small area on the dorsum of his right foot which is possibly cause due to pressure. He now has a plantar wound on the first metatarsal head on the right foot where his podiatrist Dr. Vickki Muff had debrided a callus yesterday. Objective Constitutional Pulse regular. Respirations normal and unlabored. Afebrile. Vitals Time Taken: 12:20 PM, Height: 67 in, Weight: 136 lbs, BMI: 21.3, Temperature: 97.7 F, Pulse: 66 bpm, Respiratory Rate: 16 breaths/min, Blood Pressure: 130/53 mmHg. Eyes Nonicteric. Reactive to light. Ears, Nose, Mouth, and Throat Lips, teeth, and gums WNL.Marland Kitchen Moist mucosa without lesions. Neck supple and nontender. No palpable supraclavicular or cervical  adenopathy. Normal sized without goiter. Respiratory WNL. No retractions.. Breath sounds WNL, No rubs, rales, rhonchi, or wheeze.. Cardiovascular Heart rhythm and rate regular, no murmur or gallop.. Pedal Pulses WNL. No clubbing, cyanosis or edema. Chest Breasts symmetical and no nipple discharge.. Breast tissue WNL, no masses, lumps, or tenderness.. Gastrointestinal (GI) Abdomen without masses or tenderness.. No liver or spleen enlargement or tenderness.. Genitourinary (GU) No hydrocele, spermatocele, tenderness of the cord, or testicular mass.Marland Kitchen Penis without lesions.Lowella Fairy without lesions. No cystocele, or rectocele. Pelvic support intact, no discharge.Marland Kitchen Urethra without masses, tenderness or scarring.Marland Kitchen Lymphatic No adneopathy. No adenopathy. No adenopathy. Musculoskeletal Adexa without tenderness or enlargement.. Digits and nails w/o clubbing, cyanosis, infection, petechiae, ischemia, or inflammatory conditions.[name]  ([id number]) Psychiatric Judgement and insight Intact.. No evidence of depression, anxiety, or agitation.. General Notes: the right lateral malleolar wound required sharp debridement today and I'm done this with the #3 curet and there is some granulation tissue below this. The wound on the dorsum of the foot did not need sharp debridement today. The wound on the right plantar aspect of the foot looks clean and no sharp debridement was required today. Integumentary (Hair, Skin) No suspicious lesions. No crepitus or fluctuance. No peri-wound warmth or erythema. No masses.. Wound #1 status is Open. Original cause of wound was Gradually Appeared. The wound is located on the Right,Lateral Malleolus. The wound measures 0.8cm length x 0.8cm width x 0.4cm depth; 0.503cm^2 area and 0.201cm^3 volume. There is Fat Layer (Subcutaneous Tissue) Exposed exposed. There is no tunneling or undermining noted. There is a medium amount of serous drainage noted. The wound margin is flat and intact. There is small (1-33%) pink, pale granulation within the wound bed. There is a large (67-100%) amount of necrotic tissue within the wound bed including Adherent Slough. The periwound skin appearance exhibited: Induration, Erythema. The periwound skin appearance did not exhibit: Dry/Scaly, Maceration. The surrounding wound skin color is noted with erythema which is circumferential. Periwound temperature was noted as No Abnormality. The periwound has tenderness on palpation. Wound #2 status is Open. Original cause of wound was Gradually Appeared. The wound is located on the Right,Dorsal Foot. The wound measures 0.4cm length x 0.4cm width x 0.1cm depth; 0.126cm^2 area and 0.013cm^3 volume. There is Fat Layer (Subcutaneous Tissue) Exposed exposed. There is no tunneling or undermining noted. There is a medium amount of serosanguineous drainage noted. The wound margin is flat and intact. There is no granulation within the wound  bed. There is a large (67-100%) amount of necrotic tissue within the wound bed including Adherent Slough. The periwound skin appearance did not exhibit: Callus, Crepitus, Excoriation, Induration, Rash, Scarring, Dry/Scaly, Maceration, Atrophie Blanche, Cyanosis, Ecchymosis, Hemosiderin Staining, Mottled, Pallor, Rubor, Erythema. Periwound temperature was noted as No Abnormality. The periwound has tenderness on palpation. Wound #3 status is Open. Original cause of wound was Pressure Injury. The wound is located on the [place]. The wound measures 0.6cm length x 0.7cm width x 0.1cm depth; 0.33cm^2 area and 0.033cm^3 volume. There is Fat Layer (Subcutaneous Tissue) Exposed exposed. There is no tunneling or undermining noted. There is a medium amount of serosanguineous drainage noted. The wound margin is distinct with the outline attached to the wound base. There is medium (34-66%) pink, pale granulation within the wound bed. There is a medium (34-66%) amount of necrotic tissue within the wound bed including Adherent Slough. The periwound skin appearance exhibited: Callus. The periwound skin appearance did not exhibit: Crepitus, Excoriation, Induration, Rash, Scarring,  Dry/Scaly, Maceration, Atrophie Blanche, Cyanosis, Ecchymosis, Hemosiderin Staining, Mottled, Pallor, Rubor, Erythema. Periwound temperature was noted as No Abnormality. Assessment COLTYN, HANNING ([id number]) Active Problems ICD-10 E11.621 - Type 2 diabetes mellitus with foot ulcer L89.512 - Pressure ulcer of right ankle, stage 2 L89.521 - Pressure ulcer of left ankle, stage 1 E44.1 - Mild protein-calorie malnutrition Procedures Wound #1 Wound #1 is a Pressure Ulcer located on the Right,Lateral Malleolus . There was a Skin/Subcutaneous Tissue Debridement ([id number]) debridement with total area of 0.64 sq cm performed by Christin Fudge, MD. with the following instrument(s): Curette to remove Non-Viable tissue/material  including Fibrin/Slough and Subcutaneous after achieving pain control using Lidocaine 4% Topical Solution. A time out was conducted at 12:31, prior to the start of the procedure. A Minimum amount of bleeding was controlled with Pressure. The procedure was tolerated well with a pain level of 0 throughout and a pain level of 0 following the procedure. Post Debridement Measurements: 0.8cm length x 0.8cm width x 0.4cm depth; 0.201cm^3 volume. Post debridement Stage noted as Category/Stage III. Character of Wound/Ulcer Post Debridement requires further debridement. Severity of Tissue Post Debridement is: Fat layer exposed. Post procedure Diagnosis Wound #1: Same as Pre-Procedure Plan Wound Cleansing: Wound #1 Right,Lateral Malleolus: Clean wound with Normal Saline. Cleanse wound with mild soap and water Wound #2 Right,Dorsal Foot: Clean wound with Normal Saline. Cleanse wound with mild soap and water Wound #3 Right,Plantar Foot: Clean wound with Normal Saline. Cleanse wound with mild soap and water Anesthetic: Wound #1 Right,Lateral Malleolus: Topical Lidocaine 4% cream applied to wound bed prior to debridement Wound #2 Right,Dorsal Foot: Topical Lidocaine 4% cream applied to wound bed prior to debridement MORONI, NESTER ([id number]) Wound #3 Right,Plantar Foot: Topical Lidocaine 4% cream applied to wound bed prior to debridement Skin Barriers/Peri-Wound Care: Wound #1 Right,Lateral Malleolus: Antifungal cream - LOTRISONSE.Marland Kitchenapply around wound on reddened areas as needed Wound #2 Right,Dorsal Foot: Antifungal cream - LOTRISONSE.Marland Kitchenapply around wound on reddened areas as needed Primary Wound Dressing: Wound #1 Right,Lateral Malleolus: Santyl Ointment - only in clinic Hydrogel - similar to [place] jelly over the counter Cutimed Sorbact - cut small piece and insert in wound Wound #2 Right,Dorsal Foot: Santyl Ointment - only in clinic Hydrogel - similar to [place] jelly over the counter Cutimed  Sorbact - cut small piece and insert in wound Wound #3 Right,Plantar Foot: Aquacel Ag Secondary Dressing: Wound #1 Right,Lateral Malleolus: Dry Gauze Boardered Foam Dressing Wound #2 Right,Dorsal Foot: Dry Gauze Boardered Foam Dressing Wound #3 Right,Plantar Foot: Dry Gauze Boardered Foam Dressing Dressing Change Frequency: Wound #1 Right,Lateral Malleolus: Change dressing every day. Wound #2 Right,Dorsal Foot: Change dressing every day. Follow-up Appointments: Wound #1 Right,Lateral Malleolus: Return Appointment in 1 week. Wound #2 Right,Dorsal Foot: Return Appointment in 1 week. Edema Control: Wound #1 Right,Lateral Malleolus: Elevate legs to the level of the heart and pump ankles as often as possible Wound #2 Right,Dorsal Foot: Elevate legs to the level of the heart and pump ankles as often as possible Off-Loading: Wound #1 Right,Lateral Malleolus: Other: - Keep pressure off the malleolus, FELT Wound #3 Right,Plantar Foot: Other: - Keep pressure off the malleolus, FELT Wound #2 Right,Dorsal Foot: Other: - Keep pressure off the malleolus Additional Orders / Instructions: QUARTEZ, LAGOS ([id number]) Wound #1 Right,Lateral Malleolus: Increase protein intake. Activity as tolerated Wound #2 Right,Dorsal Foot: Increase protein intake. Activity as tolerated Medications-please add to medication list.: Wound #1 Right,Lateral Malleolus: Other: - Include these in your diet...VITAMIN A, C, ZINC, MVI  After review today,and sharp debridement, I have noted that the maceration on the right lateral malleolus is much better. I have recommended Santyl to be applied to his right lateral malleolus and right dorsum of the foot. At home, since she cannot afford Santyl ointment we will use hydrogel and Sorbact. The plantar aspect of his right foot will be dressed with silver alginate and a bordered foam. A piece of felt offloading will also be done Offloading has been discussed in  great detail. his proteins have improved and overall is continuing to watch his nutrition and vitamin supplements. We will see him back next week. Electronic Signature(s) Signed: 06/23/2016 12:40:45 PM By: Christin Fudge MD, FACS Entered By: Christin Fudge on 06/23/2016 12:40:45 [name] ([id number]) -------------------------------------------------------------------------------- SuperBill Details Patient Name: [name] Date of Service: 06/23/2016 Medical Record Number: [id number] Patient Account Number: [id number] Date of Birth/Sex: 09/12/1940 (76 y.o. Male) Treating RN: Baruch Gouty, RN, BSN, [place] Primary Care Provider: Enid Derry Other Clinician: Referring Provider: Enid Derry Treating Provider/Extender: Frann Rider in Treatment: 15 Diagnosis Coding ICD-10 Codes Code Description E11.621 Type 2 diabetes mellitus with foot ulcer L89.512 Pressure ulcer of right ankle, stage 2 L89.521 Pressure ulcer of left ankle, stage 1 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 Facility Procedures CPT4 Code: 29924268 Description: 34196 - DEB SUBQ TISSUE 20 SQ CM/< ICD-10 Description Diagnosis E11.621 Type 2 diabetes mellitus with foot ulcer L89.512 Pressure ulcer of right ankle, stage 2 L89.521 Pressure ulcer of left ankle, stage 1 Modifier: Quantity: 1 Physician Procedures CPT4 Code: 2229798 Description: 92119 - WC PHYS SUBQ TISS 20 SQ CM ICD-10 Description Diagnosis E11.621 Type 2 diabetes mellitus with foot ulcer L89.512 Pressure ulcer of right ankle, stage 2 L89.521 Pressure ulcer of left ankle, stage 1 Modifier: Quantity: 1 Electronic Signature(s) Signed: 06/23/2016 1:29:03 PM By: Christin Fudge MD, FACS Entered By: Christin Fudge on 06/23/2016 13:29:03

## 2016-06-26 ENCOUNTER — Ambulatory Visit
Admission: RE | Admit: 2016-06-26 | Discharge: 2016-06-26 | Disposition: A | Discharge status: Home / Self Care | Payer: Medicare Other | Source: Ambulatory Visit | Attending: Oncology | Admitting: Oncology

## 2016-06-26 DIAGNOSIS — Z7984 Long term (current) use of oral hypoglycemic drugs: Secondary | ICD-10-CM | POA: Insufficient documentation

## 2016-06-26 DIAGNOSIS — C7951 Secondary malignant neoplasm of bone: Secondary | ICD-10-CM | POA: Insufficient documentation

## 2016-06-26 DIAGNOSIS — R590 Localized enlarged lymph nodes: Secondary | ICD-10-CM | POA: Diagnosis not present

## 2016-06-26 DIAGNOSIS — C155 Malignant neoplasm of lower third of esophagus: Secondary | ICD-10-CM | POA: Diagnosis not present

## 2016-06-26 DIAGNOSIS — Z7982 Long term (current) use of aspirin: Secondary | ICD-10-CM | POA: Insufficient documentation

## 2016-06-26 DIAGNOSIS — C7989 Secondary malignant neoplasm of other specified sites: Secondary | ICD-10-CM | POA: Insufficient documentation

## 2016-06-26 DIAGNOSIS — D001 Carcinoma in situ of esophagus: Secondary | ICD-10-CM | POA: Diagnosis not present

## 2016-06-26 DIAGNOSIS — Z79899 Other long term (current) drug therapy: Secondary | ICD-10-CM | POA: Insufficient documentation

## 2016-06-26 DIAGNOSIS — R918 Other nonspecific abnormal finding of lung field: Secondary | ICD-10-CM | POA: Diagnosis not present

## 2016-06-26 DIAGNOSIS — C787 Secondary malignant neoplasm of liver and intrahepatic bile duct: Secondary | ICD-10-CM | POA: Insufficient documentation

## 2016-06-26 DIAGNOSIS — C786 Secondary malignant neoplasm of retroperitoneum and peritoneum: Secondary | ICD-10-CM | POA: Diagnosis not present

## 2016-06-26 LAB — GLUCOSE, CAPILLARY: GLUCOSE-CAPILLARY: 97 mg/dL (ref 65–99)

## 2016-06-26 MED ORDER — FLUDEOXYGLUCOSE F - 18 (FDG) INJECTION
12.5900 | Freq: Once | INTRAVENOUS | Status: AC | PRN
  Administered 2016-06-26: 12.59 via INTRAVENOUS

## 2016-06-30 ENCOUNTER — Encounter: Payer: Medicare Other | Admitting: Surgery

## 2016-06-30 DIAGNOSIS — L89512 Pressure ulcer of right ankle, stage 2: Secondary | ICD-10-CM | POA: Diagnosis not present

## 2016-06-30 DIAGNOSIS — E441 Mild protein-calorie malnutrition: Secondary | ICD-10-CM | POA: Diagnosis not present

## 2016-06-30 DIAGNOSIS — L89513 Pressure ulcer of right ankle, stage 3: Secondary | ICD-10-CM | POA: Diagnosis not present

## 2016-06-30 DIAGNOSIS — L89521 Pressure ulcer of left ankle, stage 1: Secondary | ICD-10-CM | POA: Diagnosis not present

## 2016-06-30 DIAGNOSIS — D649 Anemia, unspecified: Secondary | ICD-10-CM | POA: Diagnosis not present

## 2016-06-30 DIAGNOSIS — E11621 Type 2 diabetes mellitus with foot ulcer: Secondary | ICD-10-CM | POA: Diagnosis not present

## 2016-06-30 DIAGNOSIS — C155 Malignant neoplasm of lower third of esophagus: Secondary | ICD-10-CM | POA: Diagnosis not present

## 2016-06-30 DIAGNOSIS — L97512 Non-pressure chronic ulcer of other part of right foot with fat layer exposed: Secondary | ICD-10-CM | POA: Diagnosis not present

## 2016-07-02 NOTE — Progress Notes (Addendum)
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  INTERVAL HISTORY:  Patient returns to clinic today for further evaluation, discussion of his PET scan results, and treatment planning. His neuropathy is unchanged. He has a decreased appetite, but his weight is relatively unchanged. He complains of persistent hoarseness of his voice, but no dysphasia. He has no other neurologic complaints. He denies any recent fevers or illnesses. He denies any chest pain or shortness of breath. He denies any nausea, vomiting, constipation, or diarrhea. He has no urinary complaints. Patient otherwise feels well and offers no further specific complaints.  REVIEW OF SYSTEMS:   Review of Systems  Constitutional: Positive for malaise/fatigue and weight loss. Negative for fever.  HENT: Positive for sore throat. Negative for congestion.   Respiratory: Negative for cough and shortness of breath.   Cardiovascular: Negative.  Negative for chest pain and leg swelling.  Gastrointestinal: Negative.  Negative for abdominal pain, blood in stool and melena.  Genitourinary: Negative.   Musculoskeletal: Negative.   Skin: Negative.  Negative for rash.  Neurological: Positive for sensory change and weakness.  Psychiatric/Behavioral: Negative.  The patient is not nervous/anxious.     As per HPI. Otherwise, a complete review of systems is negative.  PAST MEDICAL HISTORY: Past Medical History:  Diagnosis Date  . Anemia   . Arthritis   . 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 Diabetes mellitus  without complication ([place])   . 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 Esophageal cancer ([place])   . GI bleed   . Hearing loss    left ear  . Hypertension   . Hypothyroidism   . Liver mass   . Mild left atrial enlargement 08/12/2015   Very mild; LA 4.2 cm; echo March 2017  . Mitral valvular regurgitation 08/12/2015  . Sleep apnea   . Stroke ([place])   . TIA (transient ischemic attack)   . 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 APPENDECTOMY    . EUS N/A 10/07/2015   Procedure: FULL UPPER ENDOSCOPIC ULTRASOUND (EUS) RADIAL;  Surgeon: Holly Bodily, MD;  Location: [facility] ENDOSCOPY;  Service: Endoscopy;  Laterality: N/A;  . EYE SURGERY    . FRACTURE SURGERY    . NOSE SURGERY    . PERIPHERAL VASCULAR CATHETERIZATION N/A 10/26/2015   Procedure: Glori Luis Cath Insertion;  Surgeon: Katha Cabal, MD;  Location: [facility];  Service: Cardiovascular;  Laterality: N/A;    FAMILY HISTORY Family History  Problem Relation Age of Onset  . Hypertension Mother   . Lung cancer Father   . Hypertension Brother        ADVANCED DIRECTIVES:    HEALTH MAINTENANCE: Social History  Substance Use Topics  . Smoking status: Former Smoker    Packs/day: 1.00    Types: Cigarettes    Quit date: 10/03/1983  . Smokeless tobacco: Never Used  . Alcohol use 0.0 oz/week     Comment: rarely     Colonoscopy:  PAP:  Bone density:  Lipid panel:  Allergies  Allergen Reactions  . Sulfa Antibiotics Itching and Hives  . Amoxicillin Hives  . Ampicillin Hives  . Keflex [Cephalexin] Itching  . Lisinopril Other (See Comments)  . Losartan Other (See Comments)  . Sulfamethoxazole-Trimethoprim Other (See Comments)  . Cephalosporins Hives    Current Outpatient Prescriptions  Medication Sig Dispense Refill  . aspirin EC 81 MG tablet Take 81 mg by mouth 2 (two) times daily.     Marland Kitchen BIOTIN PO Take  by mouth.    . Cholecalciferol (VITAMIN D3) 1000 UNITS CAPS Take 2,000 Units by mouth daily.     . Coenzyme Q10 (CO Q-10) 200 MG CAPS Take 200 mg by mouth daily.     Marland Kitchen glucose blood (FREESTYLE LITE) test strip Use 2 (two) times daily. Free style lite test strips    . levothyroxine (SYNTHROID, LEVOTHROID) 100 MCG tablet Take 100 mcg by mouth every morning.    . lidocaine-prilocaine (EMLA) cream Apply 1 application topically as needed. Apply to port 1-2 hours prior to chemotherapy. Cover with plastic wrap. 30 g 2  . Magnesium Oxide 250 MG TABS Take 1 tablet (250 mg total) by mouth daily.  0  . metFORMIN (GLUCOPHAGE) 1000 MG tablet Take 1,000 mg by mouth daily.     . metoprolol tartrate (LOPRESSOR) 25 MG tablet Take 12.5 mg by mouth 2 (two) times daily.     . Misc Natural Products (BEE PROPOLIS PO) Take 1 tablet by mouth 2 (two) times daily.     . Multiple Vitamins-Minerals (MULTI FOR HIM 50+ PO) Take 1 tablet by mouth daily.    . prochlorperazine (COMPAZINE) 10 MG tablet Take 1 tablet (10 mg total) by mouth every 6 (six) hours as needed for nausea or vomiting. 30 tablet 2  . Red Yeast Rice 600 MG TABS Take 2 tablets by mouth at bedtime.    . sucralfate (CARAFATE) 1 g tablet Take 1 tablet (1 g total) by mouth 3 (three) times daily. Dissolve each tablet in 2-3 tbsp warm water, swish and swallow. 90 tablet 2  . Wound Dressings (MEDIHONEY WOUND/BURN DRESSING) PSTE   0  . isosorbide mononitrate (IMDUR) 60 MG 24 hr tablet Take 1 tablet (60 mg total) by mouth daily. 30 tablet 6  . loperamide (IMODIUM A-D) 2 MG tablet Take 2 at diarrhea onset, then 1 every 2hr until 12hrs with no BM. May take 2 every 4hrs at night. If diarrhea recurs repeat. 100 tablet 1   No current facility-administered medications for this visit.    Facility-Administered Medications Ordered in Other Visits  Medication Dose Route Frequency Provider Last Rate Last Dose  . 0.9 %  sodium chloride infusion   Intravenous Continuous  Lloyd Huger, MD        OBJECTIVE: Vitals:   07/03/16 1114  BP: (!) 153/72  Pulse: 73  Resp: 20  Temp: 98.1 F (36.7 C)     Body mass index is 20.82 kg/m.    ECOG FS:1 - Symptomatic but completely ambulatory  General: Well-developed, well-nourished, no acute distress. Eyes: Pink conjunctiva, anicteric sclera. Lungs: Clear to auscultation bilaterally. Heart: Regular rate and rhythm. No rubs, murmurs, or gallops. Abdomen: Soft, nontender, nondistended. No organomegaly noted, normoactive bowel sounds. Musculoskeletal: No edema, cyanosis, or clubbing. Neuro: Alert, answering all questions appropriately. Cranial nerves grossly intact. Skin: No rashes or petechiae noted. 1 cm ulcer on right ankle unchanged. Psych: Normal affect.   LAB RESULTS:  Lab Results  Component Value Date   NA 131 (  L) 07/03/2016   K 4.0 07/03/2016   CL 95 (L) 07/03/2016   CO2 26 07/03/2016   GLUCOSE 183 (H) 07/03/2016   BUN 15 07/03/2016   CREATININE 0.61 07/03/2016   CALCIUM 9.2 07/03/2016   PROT 7.0 07/03/2016   ALBUMIN 3.5 07/03/2016   AST 32 07/03/2016   ALT 17 07/03/2016   ALKPHOS 72 07/03/2016   BILITOT 0.3 07/03/2016   GFRNONAA >60 07/03/2016   GFRAA >60 07/03/2016    Lab Results  Component Value Date   WBC 7.6 07/03/2016   NEUTROABS 6.2 07/03/2016   HGB 8.7 (L) 07/03/2016   HCT 25.7 (L) 07/03/2016   MCV 84.1 07/03/2016   PLT 448 (H) 07/03/2016    Lab Results  Component Value Date   CA199 43,052 (H) 07/03/2016   Lab Results  Component Value Date   CEA 746.9 (H) 07/03/2016     STUDIES: Nm Pet Image Restag (ps) Skull Base To Thigh  Result Date: 06/26/2016 CLINICAL DATA:  Subsequent treatment strategy for esophageal carcinoma. EXAM: NUCLEAR MEDICINE PET SKULL BASE TO THIGH TECHNIQUE: 12.6 mCi F-18 FDG was injected intravenously. Full-ring PET imaging was performed from the skull base to thigh after the radiotracer. CT data was obtained and used for attenuation  correction and anatomic localization. FASTING BLOOD GLUCOSE:  Value: 97 mg/dl COMPARISON:  PET-CT 03/23/2016, 02/02/2016 FINDINGS: NECK Focal activity in the muscle anterior to the LEFT thyroid cartilage is new from prior. CHEST Upper lobe pulmonary nodules mildly increased. For example 10 mm nodule (image 83, series 3) in the RIGHT upper lobe is increased from 5 mm. Peripheral nodule in the RIGHT upper lobe measuring 11 mm (image 86, series 3) is increased from 8 mm. Several nodules have low metabolic activity. New hypermetabolic AP window mediastinal lymph node. There is diffuse activity through the distal esophagus similar prior. ABDOMEN/PELVIS Metastatic lesion in the subcapsular RIGHT hepatic lobe with SUV max 6.3 compared to 6.2 for no change. Hypermetabolic partially calcified periportal lymph node is increased in metabolic activity with SUV max 12.2 increased from 7.3. Cluster of lymph nodes around the celiac trunk are not changed in metabolic activity (SUV max equals 7.3). 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 SKELETON Recurrence of skeletal metastasis in the tip of the RIGHT scapula with enlarged partially lytic lesion with SUV max equal 15.2. Evidence of metastatic lesion in the LEFT sacral ala with SUV max equal 5.4. New soft tissue metastasis in the LEFT piriformis muscle with SUV max equal 5.3. Persistent large lesion in the RIGHT adductor muscle with SUV max equal 15.0 increased from 9.5. IMPRESSION: 1. Mild to moderate progression / recurrence of metastatic esophageal carcinoma. 2. Recurrence of skeletal metastasis in the RIGHT scapula and new lesion in the LEFT super ala. 3. Interval increase in size and mild metabolic activity of upper lobe pulmonary nodules. 4. Stable activity through the distal esophagus. 5. Stable RIGHT hepatic lobe metastasis. 6. Increase in metabolic activity periportal adenopathy. Stable celiac adenopathy. 7. Mild increase in  activity of peritoneal metastasis in the LEFT inguinal canal. 8. Increase soft tissue muscle metastasis with increased activity of the RIGHT adductor lesion and potential new lesion LEFT piriformis muscle and muscle anterior to the LEFT thyroid cartilage. Electronically Signed   By: Suzy Bouchard M.D.   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  PLAN:    1. Stage  IV adenocarcinoma of the lower third esophagus, HER-2 positive, with liver metastasis: PET scan results from Jun 26, 2016 reviewed independently and reported as above with continued progression of disease. Patient's tumor markers are also increasing. After lengthy discussion with the patient, he is not ready to enroll in hospice and wishes to pursue additional chemotherapy. Given his poor appetite, declining performance status as well as his cytopenias, patient will receive dose reduced irinotecan plus cisplatin on days 1 and 8 with day 15 off. This will be a 21 day cycle. Return to clinic later this week for consideration of cycle 1, day 1 and then 7 days later for further evaluation and consideration of cycle 1, day 8.  2. Iron deficiency anemia: Consider IV iron in the future. 3. Cardiac disease: Treatment per cardiology. MUGA scan on March 20, 2016 reported an EF of 52.8%, unchanged. 4. Weight loss: Patient's weight has been relatively stable. 5. Dysphagia: Resolved. XRT has been completed.  Patient denies any issues swallowing. 6. Weakness and fatigue: Improving. 7. Ankle ulcer: Nonhealing, continue treatment per wound care.  Dressing intact. 8. Peripheral neuropathy: Discontinue Taxol as above. 9. Hoarseness of voice: Unclear etiology, possibly secondary to gastric reflux. Monitor.  Patient expressed understanding and was in agreement with this plan. He also understands that He can call clinic at any time with any questions, concerns, or  complaints.   Addendum: Patient received day 1 cycle 1 of dose reduced cisplatin and irinotecan on Jul 05, 2016. He has now changed his mind has elected to pursue comfort care and hospice. The remainder of his treatments have been canceled and no further follow-up is necessary.   Lloyd Huger, MD   07/08/2016 7:56 AM

## 2016-07-02 NOTE — Progress Notes (Addendum)
KHAIDEN, SEGRETO ([id number]) Visit Report for 06/30/2016 Chief Complaint Document Details Patient Name: [name], [name]. Date of Service: 06/30/2016 9:15 AM Medical Record Number: [id number] Patient Account Number: [id number] Date of Birth/Sex: Aug 05, 1940 (76 y.o. Male) Treating RN: Baruch Gouty, RN, BSN, Velva Harman Primary Care Provider: Enid Derry Other Clinician: Referring Provider: Enid Derry Treating Provider/Extender: Frann Rider in Treatment: 16 Information Obtained from: Patient Chief Complaint Patient is at the clinic for treatment of an open pressure ulcer to the right lateral ankle which she's had for about 2 months Electronic Signature(s) Signed: 06/30/2016 9:49:49 AM By: Christin Fudge MD, FACS Entered By: Christin Fudge on 06/30/2016 09:49:48 [name] ([id number]) -------------------------------------------------------------------------------- Debridement Details Patient Name: [name] Date of Service: 06/30/2016 9:15 AM Medical Record Number: [id number] Patient Account Number: [id number] Date of Birth/Sex: 09-Aug-1940 (76 y.o. Male) Treating RN: Baruch Gouty, RN, BSN, [place] Primary Care Provider: Enid Derry Other Clinician: Referring Provider: Enid Derry Treating Provider/Extender: Frann Rider in Treatment: 16 Debridement Performed for Wound #1 Right,Lateral Malleolus Assessment: Performed By: Physician Christin Fudge, MD Debridement: Debridement Pre-procedure Yes - 09:39 Verification/Time Out Taken: Start Time: 09:39 Pain Control: Lidocaine 4% Topical Solution Level: Skin/Subcutaneous Tissue Total Area Debrided (L x 0.8 (cm) x 0.8 (cm) = 0.64 (cm) W): Tissue and other Non-Viable, Exudate, Fat, Fibrin/Slough, Subcutaneous material debrided: Instrument: Curette Bleeding: Minimum Hemostasis Achieved: Pressure End Time: 09:41 Procedural Pain: 0 Post Procedural Pain: 0 Response to Treatment: Procedure was tolerated well Post Debridement  Measurements of Total Wound Length: (cm) 0.8 Stage: Category/Stage III Width: (cm) 0.8 Depth: (cm) 0.3 Volume: (cm) 0.151 Character of Wound/Ulcer Post Requires Further Debridement: Debridement Severity of Tissue Post Fat layer exposed Debridement: Post Procedure Diagnosis Same as Pre-procedure Electronic Signature(s) Signed: 06/30/2016 9:49:20 AM By: Christin Fudge MD, FACS Signed: 06/30/2016 1:22:14 PM By: Regan Lemming BSN, RN Entered By: Christin Fudge on 06/30/2016 09:49:20 [name], [name] ([name], [name] ([id number]) -------------------------------------------------------------------------------- Debridement Details Patient Name: [name], [name]. Date of Service: 06/30/2016 9:15 AM Medical Record Number: [id number] Patient Account Number: [id number] Date of Birth/Sex: 1940-12-20 (76 y.o. Male) Treating RN: Baruch Gouty, RN, BSN, [place] Primary Care Provider: Enid Derry Other Clinician: Referring Provider: Enid Derry Treating Provider/Extender: Frann Rider in Treatment: 16 Debridement Performed for Wound #3 Right,Plantar Foot Assessment: Performed By: Physician Christin Fudge, MD Debridement: Debridement Pre-procedure Yes - 09:37 Verification/Time Out Taken: Start Time: 09:37 Pain Control: Lidocaine 4% Topical Solution Level: Skin/Subcutaneous Tissue Total Area Debrided (L x 0.7 (cm) x 0.7 (cm) = 0.49 (cm) W): Tissue and other Exudate, Fat, Fibrin/Slough, Subcutaneous material debrided: Instrument: Curette Bleeding: Minimum Hemostasis Achieved: Pressure End Time: 09:39 Procedural Pain: 0 Post Procedural Pain: 0 Response to Treatment: Procedure was tolerated well Post Debridement Measurements of Total Wound Length: (cm) 0.7 Width: (cm) 0.7 Depth: (cm) 0.1 Volume: (cm) 0.038 Character of Wound/Ulcer Post Requires Further Debridement Debridement: Severity of Tissue Post Debridement: Fat layer exposed Post Procedure Diagnosis Same as  Pre-procedure Electronic Signature(s) Signed: 06/30/2016 9:49:29 AM By: Christin Fudge MD, FACS Signed: 06/30/2016 1:22:14 PM By: Regan Lemming BSN, RN Entered By: Christin Fudge on 06/30/2016 09:49:28 [name], [name] ([name], [name] ([id number]) -------------------------------------------------------------------------------- HPI Details Patient Name: [name], [name]. Date of Service: 06/30/2016 9:15 AM Medical Record Number: [id number] Patient Account Number: [id number] Date of Birth/Sex: 11/15/1940 (76 y.o. Male) Treating RN: Baruch Gouty, RN, BSN, Velva Harman Primary Care Provider: Enid Derry Other Clinician: Referring Provider: Enid Derry Treating Provider/Extender: Frann Rider in Treatment: 16 History of Present  Illness Location: Patient presents with an ulcer on the right lateral ankle. Quality: Patient reports experiencing a dull pain to affected area(s). Severity: Patient states wound are getting worse. Duration: Patient has had the wound for > 2 months prior to seeking treatment at the wound center Timing: Pain in wound is Intermittent (comes and goes Context: The wound appeared gradually over time Modifying Factors: Other treatment(s) tried include:local care as per the medical oncologist Associated Signs and Symptoms: Patient reports having:some pain and no discharge HPI Description: 76 year old patient has been referred by his medical oncologist Dr. Grayland Ormond, for a right lateral malleolus ulcer has been there for several weeks. 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 He is currently on chemotherapy with FOLFOX and Herceptin. past medical history significant for diabetes mellitus, anemia, hypertension, sleep apnea, status post appendectomy, peripheral vascular catheterization( Port placement) in September 2017 by Dr. Delana Meyer. He is also receiving palliative radiation therapy and was seen by Dr.  Donella Stade. 03/17/2016 -- - x-ray of the right ankle -- IMPRESSION: 1. Soft tissue wound noted over the lateral malleolus, no underlying bony abnormality. No acute bony abnormality. 2. Diffuse degenerative change. 3. Peripheral vascular disease. 05/05/2016 -- the patient's wife tells me that she has noticed a small superficial bruise on the left lateral ankle and wanted me to view this area. 05/12/2016 -- the patient is unable to afford Santyl ointment and we have been struggling over the last 8 weeks to use various alternatives. I have asked them today if they would be willing to use the snap vacuum system and she will let me know soon. 05/19/2016 -- the patient and the wife have declined the use of the snap the vacuum system. He does not want any debridement to be done today. His proteins have improved a bit. 06/08/2016 -- . Not seen the patient back for 3 weeks and in the meanwhile the wife has decided to use duoderm on the wound,-- given to her by a friend. The wound is macerated and has increased in size due to the moisture. 06/16/2016 -- besides the original wound he had on his right lateral malleolus he has a small area on the dorsum of his right foot which is possibly cause due to pressure. He now has a plantar wound on the first metatarsal head on the right foot where his podiatrist Dr. Vickki Muff had debrided a callus yesterday. Electronic Signature(s) Signed: 06/30/2016 9:49:58 AM By: Christin Fudge MD, FACS Entered By: Christin Fudge on 06/30/2016 09:49:57 TORRES, HARDENBROOK ([id number], LEITZ ([id number]) -------------------------------------------------------------------------------- Physical Exam Details Patient Name: [name], [name] Date of Service: 06/30/2016 9:15 AM Medical Record Number: [id number] Patient Account Number: [id number] Date of Birth/Sex: [birth year] (76 y.o. Male) Treating RN: Baruch Gouty, RN, BSN, Velva Harman Primary Care Provider: Enid Derry Other  Clinician: Referring Provider: Enid Derry Treating Provider/Extender: Frann Rider in Treatment: 16 Constitutional . Pulse regular. Respirations normal and unlabored. Afebrile. . Eyes Nonicteric. Reactive to light. Ears, Nose, Mouth, and Throat Lips, teeth, and gums WNL.Marland Kitchen Moist mucosa without lesions. Neck supple and nontender. No palpable supraclavicular or cervical adenopathy. Normal sized without goiter. Respiratory WNL. No retractions.. Breath sounds WNL, No rubs, rales, rhonchi, or wheeze.. Cardiovascular Heart rhythm and rate regular, no murmur or gallop.. Pedal Pulses WNL. No clubbing, cyanosis or edema. Lymphatic No adneopathy. No adenopathy. No adenopathy. Musculoskeletal Adexa without tenderness or enlargement.. Digits and nails w/o clubbing, cyanosis, infection, petechiae, ischemia, or inflammatory conditions.Marland Kitchen  Integumentary (Hair, Skin) No suspicious lesions. No crepitus or fluctuance. No peri-wound warmth or erythema. No masses.Marland Kitchen Psychiatric Judgement and insight Intact.. No evidence of depression, anxiety, or agitation.. Notes sharp debridement of the right lateral malleolar wound was done with a #3 curet and overall there is good granulation tissue at the base. The wound on the dorsum of his right foot is looking good and did not need any sharp debridement. The wound on the right plantar foot needed sharp debridement today with a #3 curet and bleeding was controlled with pressure Electronic Signature(s) Signed: 06/30/2016 9:50:47 AM By: Christin Fudge MD, FACS Entered By: Christin Fudge on 06/30/2016 09:50:46 [name] ([id number]) -------------------------------------------------------------------------------- Physician Orders Details Patient Name: [name] Date of Service: 06/30/2016 9:15 AM Medical Record Number: [id number] Patient Account Number: [id number] Date of Birth/Sex: [birth year] (76 y.o. Male) Treating RN: Baruch Gouty, RN, BSN,  Velva Harman Primary Care Provider: Enid Derry Other Clinician: Referring Provider: Enid Derry Treating Provider/Extender: Frann Rider in Treatment: 89 Verbal / Phone Orders: No Diagnosis Coding Wound Cleansing Wound #1 Right,Lateral Malleolus o Clean wound with Normal Saline. o Cleanse wound with mild soap and water Wound #2 Right,Dorsal Foot o Clean wound with Normal Saline. o Cleanse wound with mild soap and water Wound #3 Right,Plantar Foot o Clean wound with Normal Saline. o Cleanse wound with mild soap and water Anesthetic Wound #1 Right,Lateral Malleolus o Topical Lidocaine 4% cream applied to wound bed prior to debridement Wound #2 Right,Dorsal Foot o Topical Lidocaine 4% cream applied to wound bed prior to debridement Wound #3 Right,Plantar Foot o Topical Lidocaine 4% cream applied to wound bed prior to debridement Skin Barriers/Peri-Wound Care Wound #1 Right,Lateral Malleolus o Antifungal cream - LOTRISONSE.Marland Kitchenapply around wound on reddened areas as needed Wound #2 Right,Dorsal Foot o Antifungal cream - LOTRISONSE.Marland Kitchenapply around wound on reddened areas as needed Primary Wound Dressing Wound #1 Right,Lateral Malleolus o Hydrogel - similar to KY jelly over the counter o Cutimed Sorbact - cut small piece and insert in wound Wound #2 Right,Dorsal Foot o Hydrogel - similar to KY jelly over the counter IZREAL, KOCK ([id number]) o Cutimed Sorbact - cut small piece and insert in wound Wound #3 Right,Plantar Foot o Aquacel Ag Secondary Dressing Wound #1 Right,Lateral Malleolus o Dry Gauze o Boardered Foam Dressing Wound #2 Right,Dorsal Foot o Dry Gauze o Boardered Foam Dressing Wound #3 Right,Plantar Foot o Dry Gauze o Boardered Foam Dressing Dressing Change Frequency Wound #1 Right,Lateral Malleolus o Change dressing every day. Wound #2 Right,Dorsal Foot o Change dressing every day. Follow-up  Appointments Wound #1 Right,Lateral Malleolus o Return Appointment in 1 week. Wound #2 Right,Dorsal Foot o Return Appointment in 1 week. Edema Control Wound #1 Right,Lateral Malleolus o Elevate legs to the level of the heart and pump ankles as often as possible Wound #2 Right,Dorsal Foot o Elevate legs to the level of the heart and pump ankles as often as possible Off-Loading Wound #1 Right,Lateral Malleolus o Other: - Keep pressure off the malleolus, FELT Wound #2 Right,Dorsal Foot o Other: - Keep pressure off the malleolus Wound #3 Right,Plantar Foot KOHLTON, GILPATRICK ([id number]) o Other: - Keep pressure off the malleolus, FELT Additional Orders / Instructions Wound #1 Right,Lateral Malleolus o Increase protein intake. o Activity as tolerated Wound #2 Right,Dorsal Foot o Increase protein intake. o Activity as tolerated Medications-please add to medication list. Wound #1 Right,Lateral Malleolus o Other: - Include these in your diet...VITAMIN A, C, ZINC, MVI Electronic Signature(s) Signed: 06/30/2016  1:22:14 PM By: Regan Lemming BSN, RN Signed: 06/30/2016 4:04:46 PM By: Christin Fudge MD, FACS Entered By: Regan Lemming on 06/30/2016 09:42:05 [name] ([id number]) -------------------------------------------------------------------------------- Problem List Details Patient Name: [name], [name] Date of Service: 06/30/2016 9:15 AM Medical Record Number: [id number] Patient Account Number: [id number] Date of Birth/Sex: April 16, 1940 (76 y.o. Male) Treating RN: Baruch Gouty, RN, BSN, Velva Harman Primary Care Provider: Enid Derry Other Clinician: Referring Provider: Enid Derry Treating Provider/Extender: Frann Rider in Treatment: 16 Active Problems ICD-10 Encounter Code Description Active Date Diagnosis E11.621 Type 2 diabetes mellitus with foot ulcer 05/05/2016 Yes L89.512 Pressure ulcer of right ankle, stage 2 05/05/2016 Yes L89.521 Pressure ulcer of  left ankle, stage 1 05/05/2016 Yes 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 05/05/2016 Yes Inactive Problems Resolved Problems Electronic Signature(s) Signed: 06/30/2016 9:49:02 AM By: Christin Fudge MD, FACS Entered By: Christin Fudge on 06/30/2016 09:49:02 [name] ([id number]) -------------------------------------------------------------------------------- Progress Note Details Patient Name: [name] Date of Service: 06/30/2016 9:15 AM Medical Record Number: [id number] Patient Account Number: [id number] Date of Birth/Sex: [birth year] (76 y.o. Male) Treating RN: Baruch Gouty, RN, BSN, Velva Harman Primary Care Provider: Enid Derry Other Clinician: Referring Provider: Enid Derry Treating Provider/Extender: Frann Rider in Treatment: 16 Subjective Chief Complaint Information obtained from Patient Patient is at the clinic for treatment of an open pressure ulcer to the right lateral ankle which she's had for about 2 months History of Present Illness (HPI) The following HPI elements were documented for the patient's wound: Location: Patient presents with an ulcer on the right lateral ankle. Quality: Patient reports experiencing a dull pain to affected area(s). Severity: Patient states wound are getting worse. Duration: Patient has had the wound for > 2 months prior to seeking treatment at the wound center Timing: Pain in wound is Intermittent (comes and goes Context: The wound appeared gradually over time Modifying Factors: Other treatment(s) tried include:local care as per the medical oncologist Associated Signs and Symptoms: Patient reports having:some pain and no discharge 76 year old patient has been referred by his medical oncologist Dr. Grayland Ormond, for a right lateral malleolus ulcer has been there for several weeks. 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 He is currently on chemotherapy with FOLFOX and Herceptin.  past medical history significant for diabetes mellitus, anemia, hypertension, sleep apnea, status post appendectomy, peripheral vascular catheterization( Port placement) in September 2017 by Dr. Delana Meyer. He is also receiving palliative radiation therapy and was seen by Dr. Donella Stade. 03/17/2016 -- - x-ray of the right ankle -- IMPRESSION: 1. Soft tissue wound noted over the lateral malleolus, no underlying bony abnormality. No acute bony abnormality. 2. Diffuse degenerative change. 3. Peripheral vascular disease. 05/05/2016 -- the patient's wife tells me that she has noticed a small superficial bruise on the left lateral ankle and wanted me to view this area. 05/12/2016 -- the patient is unable to afford Santyl ointment and we have been struggling over the last 8 weeks to use various alternatives. I have asked them today if they would be willing to use the snap vacuum system and she will let me know soon. 05/19/2016 -- the patient and the wife have declined the use of the snap the vacuum system. He does not want any debridement to be done today. His proteins have improved a bit. 06/08/2016 -- . Not seen the patient back for 3 weeks and in the meanwhile the wife has decided to use duoderm on the wound,-- given to her by a  friend. The wound is macerated and has increased in size due to the moisture. [name], [name] ([id number]) 06/16/2016 -- besides the original wound he had on his right lateral malleolus he has a small area on the dorsum of his right foot which is possibly cause due to pressure. He now has a plantar wound on the first metatarsal head on the right foot where his podiatrist Dr. Vickki Muff had debrided a callus yesterday. Allergies sufa drugs, Cephalosporins, lisinopril, losartin, Keflex, amoxicillin, ampicillin, Septra, Bactrim Objective Constitutional Pulse regular. Respirations normal and unlabored. Afebrile. Vitals Time Taken: 9:19 AM, Height: 67 in, Weight: 136 lbs, BMI: 21.3,  Temperature: 98 F, Pulse: 72 bpm, Respiratory Rate: 16 breaths/min, Blood Pressure: 139/63 mmHg. Eyes Nonicteric. Reactive to light. Ears, Nose, Mouth, and Throat Lips, teeth, and gums WNL.Marland Kitchen Moist mucosa without lesions. Neck supple and nontender. No palpable supraclavicular or cervical adenopathy. Normal sized without goiter. Respiratory WNL. No retractions.. Breath sounds WNL, No rubs, rales, rhonchi, or wheeze.. Cardiovascular Heart rhythm and rate regular, no murmur or gallop.. Pedal Pulses WNL. No clubbing, cyanosis or edema. Lymphatic No adneopathy. No adenopathy. No adenopathy. Musculoskeletal Adexa without tenderness or enlargement.. Digits and nails w/o clubbing, cyanosis, infection, petechiae, ischemia, or inflammatory conditions.Marland Kitchen Psychiatric Judgement and insight Intact.. No evidence of depression, anxiety, or agitation.. General Notes: sharp debridement of the right lateral malleolar wound was done with a #3 curet and overall there is good granulation tissue at the base. The wound on the dorsum of his right foot is looking good and [name], [name]. ([id number]) did not need any sharp debridement. The wound on the right plantar foot needed sharp debridement today with a #3 curet and bleeding was controlled with pressure Integumentary (Hair, Skin) No suspicious lesions. No crepitus or fluctuance. No peri-wound warmth or erythema. No masses.. Wound #1 status is Open. Original cause of wound was Gradually Appeared. The wound is located on the Right,Lateral Malleolus. The wound measures 0.8cm length x 0.8cm width x 0.3cm depth; 0.503cm^2 area and 0.151cm^3 volume. There is Fat Layer (Subcutaneous Tissue) Exposed exposed. There is no tunneling or undermining noted. There is a medium amount of serous drainage noted. The wound margin is flat and intact. There is small (1-33%) pink, pale granulation within the wound bed. There is a large (67-100%) amount of necrotic tissue within  the wound bed including Adherent Slough. The periwound skin appearance exhibited: Induration, Erythema. The periwound skin appearance did not exhibit: Dry/Scaly, Maceration. The surrounding wound skin color is noted with erythema which is circumferential. Periwound temperature was noted as No Abnormality. The periwound has tenderness on palpation. Wound #2 status is Open. Original cause of wound was Gradually Appeared. The wound is located on the Right,Dorsal Foot. The wound measures 0.3cm length x 0.3cm width x 0.1cm depth; 0.071cm^2 area and 0.007cm^3 volume. There is Fat Layer (Subcutaneous Tissue) Exposed exposed. There is no tunneling or undermining noted. There is a small amount of serosanguineous drainage noted. The wound margin is flat and intact. There is no granulation within the wound bed. There is a large (67-100%) amount of necrotic tissue within the wound bed including Adherent Slough. The periwound skin appearance did not exhibit: Callus, Crepitus, Excoriation, Induration, Rash, Scarring, Dry/Scaly, Maceration, Atrophie Blanche, Cyanosis, Ecchymosis, Hemosiderin Staining, Mottled, Pallor, Rubor, Erythema. Periwound temperature was noted as No Abnormality. The periwound has tenderness on palpation. Wound #3 status is Open. Original cause of wound was Pressure Injury. The wound is located on the [place]. The wound measures 0.7cm  length x 0.7cm width x 0.1cm depth; 0.385cm^2 area and 0.038cm^3 volume. There is Fat Layer (Subcutaneous Tissue) Exposed exposed. There is no tunneling or undermining noted. There is a medium amount of serosanguineous drainage noted. The wound margin is distinct with the outline attached to the wound base. There is medium (34-66%) pink, pale granulation within the wound bed. There is a medium (34-66%) amount of necrotic tissue within the wound bed including Adherent Slough. The periwound skin appearance exhibited: Callus. The periwound skin  appearance did not exhibit: Crepitus, Excoriation, Induration, Rash, Scarring, Dry/Scaly, Maceration, Atrophie Blanche, Cyanosis, Ecchymosis, Hemosiderin Staining, Mottled, Pallor, Rubor, Erythema. Periwound temperature was noted as No Abnormality. The periwound has tenderness on palpation. Assessment Active Problems ICD-10 E11.621 - Type 2 diabetes mellitus with foot ulcer L89.512 - Pressure ulcer of right ankle, stage 2 L89.521 - Pressure ulcer of left ankle, stage 1 E44.1 - Mild protein-calorie malnutrition [name], [name] ([id number]) The patient has had aggressive wound care and finally his right lateral malleolar wound is looking cleaner with some healthy granulation tissue. His diabetes is under control with his last hemoglobin A1c being 6.3%. At this stage I would recommend a skin substitute like Grafix, Affinity or Nushield. We would apply for his insurance coverage. Sorbact with hydrogel will be applied to his right ankle and dorsum of the right foot, and silver alginate with an offloading felt to the plantar aspect of his right foot. He will come back and see as next week Procedures Wound #1 Wound #1 is a Pressure Ulcer located on the Right,Lateral Malleolus . There was a Skin/Subcutaneous Tissue Debridement ([id number]) debridement with total area of 0.64 sq cm performed by Christin Fudge, MD. with the following instrument(s): Curette to remove Non-Viable tissue/material including Exudate, Fat Layer (and Subcutaneous Tissue) Exposed, Fibrin/Slough, and Subcutaneous after achieving pain control using Lidocaine 4% Topical Solution. A time out was conducted at 09:39, prior to the start of the procedure. A Minimum amount of bleeding was controlled with Pressure. The procedure was tolerated well with a pain level of 0 throughout and a pain level of 0 following the procedure. Post Debridement Measurements: 0.8cm length x 0.8cm width x 0.3cm depth; 0.151cm^3 volume. Post debridement  Stage noted as Category/Stage III. Character of Wound/Ulcer Post Debridement requires further debridement. Severity of Tissue Post Debridement is: Fat layer exposed. Post procedure Diagnosis Wound #1: Same as Pre-Procedure Wound #3 Wound #3 is a Diabetic Wound/Ulcer of the Lower Extremity located on the Right,Plantar Foot . There was a Skin/Subcutaneous Tissue Debridement ([id number]) debridement with total area of 0.49 sq cm performed by Christin Fudge, MD. with the following instrument(s): Curette including Exudate, Fat Layer (and Subcutaneous Tissue) Exposed, Fibrin/Slough, and Subcutaneous after achieving pain control using Lidocaine 4% Topical Solution. A time out was conducted at 09:37, prior to the start of the procedure. A Minimum amount of bleeding was controlled with Pressure. The procedure was tolerated well with a pain level of 0 throughout and a pain level of 0 following the procedure. Post Debridement Measurements: 0.7cm length x 0.7cm width x 0.1cm depth; 0.038cm^3 volume. Character of Wound/Ulcer Post Debridement requires further debridement. Severity of Tissue Post Debridement is: Fat layer exposed. Post procedure Diagnosis Wound #3: Same as Pre-Procedure [name], TERPSTRA ([id number]) Plan Wound Cleansing: Wound #1 Right,Lateral Malleolus: Clean wound with Normal Saline. Cleanse wound with mild soap and water Wound #2 Right,Dorsal Foot: Clean wound with Normal Saline. Cleanse wound with mild soap and water Wound #3 Right,Plantar Foot: Clean wound with  Normal Saline. Cleanse wound with mild soap and water Anesthetic: Wound #1 Right,Lateral Malleolus: Topical Lidocaine 4% cream applied to wound bed prior to debridement Wound #2 Right,Dorsal Foot: Topical Lidocaine 4% cream applied to wound bed prior to debridement Wound #3 Right,Plantar Foot: Topical Lidocaine 4% cream applied to wound bed prior to debridement Skin Barriers/Peri-Wound Care: Wound #1 Right,Lateral  Malleolus: Antifungal cream - LOTRISONSE.Marland Kitchenapply around wound on reddened areas as needed Wound #2 Right,Dorsal Foot: Antifungal cream - LOTRISONSE.Marland Kitchenapply around wound on reddened areas as needed Primary Wound Dressing: Wound #1 Right,Lateral Malleolus: Hydrogel - similar to [place] jelly over the counter Cutimed Sorbact - cut small piece and insert in wound Wound #2 Right,Dorsal Foot: Hydrogel - similar to [place] jelly over the counter Cutimed Sorbact - cut small piece and insert in wound Wound #3 Right,Plantar Foot: Aquacel Ag Secondary Dressing: Wound #1 Right,Lateral Malleolus: Dry Gauze Boardered Foam Dressing Wound #2 Right,Dorsal Foot: Dry Gauze Boardered Foam Dressing Wound #3 Right,Plantar Foot: Dry Gauze Boardered Foam Dressing Dressing Change Frequency: Wound #1 Right,Lateral Malleolus: Change dressing every day. Wound #2 Right,Dorsal Foot: YOSIAH, JASMIN ([id number]) Change dressing every day. Follow-up Appointments: Wound #1 Right,Lateral Malleolus: Return Appointment in 1 week. Wound #2 Right,Dorsal Foot: Return Appointment in 1 week. Edema Control: Wound #1 Right,Lateral Malleolus: Elevate legs to the level of the heart and pump ankles as often as possible Wound #2 Right,Dorsal Foot: Elevate legs to the level of the heart and pump ankles as often as possible Off-Loading: Wound #1 Right,Lateral Malleolus: Other: - Keep pressure off the malleolus, FELT Wound #2 Right,Dorsal Foot: Other: - Keep pressure off the malleolus Wound #3 Right,Plantar Foot: Other: - Keep pressure off the malleolus, FELT Additional Orders / Instructions: Wound #1 Right,Lateral Malleolus: Increase protein intake. Activity as tolerated Wound #2 Right,Dorsal Foot: Increase protein intake. Activity as tolerated Medications-please add to medication list.: Wound #1 Right,Lateral Malleolus: Other: - Include these in your diet...VITAMIN A, C, ZINC, MVI The patient has had aggressive wound  care and finally his right lateral malleolar wound is looking cleaner with some healthy granulation tissue. His diabetes is under control with his last hemoglobin A1c being 6.3%. At this stage I would recommend a skin substitute like Grafix, Affinity or Nushield. We would apply for his insurance coverage. Sorbact with hydrogel will be applied to his right ankle and dorsum of the right foot, and silver alginate with an offloading felt to the plantar aspect of his right foot. He will come back and see as next week Electronic Signature(s) Signed: 06/30/2016 4:07:54 PM By: Christin Fudge MD, FACS Previous Signature: 06/30/2016 9:54:40 AM Version By: Christin Fudge MD, FACS Entered By: Christin Fudge on 06/30/2016 16:07:53 JEREMAINE, MARAJ ([id number]) SIRAJ, DERMODY ([id number]) -------------------------------------------------------------------------------- SuperBill Details Patient Name: [name] Date of Service: 06/30/2016 Medical Record Number: [id number] Patient Account Number: [id number] Date of Birth/Sex: Dec 13, 1940 (76 y.o. Male) Treating RN: Baruch Gouty, RN, BSN, [place] Primary Care Provider: Enid Derry Other Clinician: Referring Provider: Enid Derry Treating Provider/Extender: Frann Rider in Treatment: 16 Diagnosis Coding ICD-10 Codes Code Description E11.621 Type 2 diabetes mellitus with foot ulcer L89.512 Pressure ulcer of right ankle, stage 2 L89.521 Pressure ulcer of left ankle, stage 1 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 Facility Procedures CPT4 Code: [id number] Description: 60737 - DEB SUBQ TISSUE 20 SQ CM/< ICD-10 Description Diagnosis E11.621 Type 2 diabetes mellitus with foot ulcer L89.512 Pressure ulcer of right ankle, stage 2 L89.521 Pressure ulcer of left ankle, stage 1 E44.1 Mild protein-calorie  malnutrition Modifier: Quantity: 1 Physician Procedures CPT4 Code: [id number] Description: 95583 - WC PHYS SUBQ TISS 20 SQ CM ICD-10 Description Diagnosis  E11.621 Type 2 diabetes mellitus with foot ulcer L89.512 Pressure ulcer of right ankle, stage 2 L89.521 Pressure ulcer of left ankle, stage 1 E44.1 Mild protein-calorie  malnutrition Modifier: Quantity: 1 Electronic Signature(s) Signed: 06/30/2016 4:08:01 PM By: Christin Fudge MD, FACS Previous Signature: 06/30/2016 9:54:56 AM Version By: Christin Fudge MD, FACS Entered By: Christin Fudge on 06/30/2016 16:08:01

## 2016-07-02 NOTE — Progress Notes (Signed)
ROBSON, TRICKEY ([id number]) Visit Report for 06/30/2016 Allergy List Details Patient Name: [name], [name]. Date of Service: 06/30/2016 9:15 AM Medical Record Number: [id number] Patient Account Number: [id number] Date of Birth/Sex: [birth year] (76 y.o. Male) Treating RN: Baruch Gouty, RN, BSN, Velva Harman Primary Care Nasean Zapf: Enid Derry Other Clinician: Referring Dana Debo: [place] Derry Treating Syanna Remmert/Extender: Frann Rider in Treatment: 16 Allergies Active Allergies sufa drugs Cephalosporins lisinopril losartin Keflex amoxicillin ampicillin Septra Bactrim Allergy Notes Electronic Signature(s) Signed: 06/30/2016 1:22:14 PM By: Regan Lemming BSN, RN Entered By: Regan Lemming on 06/30/2016 09:44:48 [name] ([id number]) -------------------------------------------------------------------------------- [place] Details Patient Name: [name] Date of Service: 06/30/2016 9:15 AM Medical Record Number: [id number] Patient Account Number: [id number] Date of Birth/Sex: Mar 10, 1940 (76 y.o. Male) Treating RN: Baruch Gouty, RN, BSN, [place] Primary Care Tomas Schamp: En[place] Derry Other Clinician: Referring Labella Zahradnik: [place] Derry Treating Adiel Mcnamara/Extender: Frann Rider in Treatment: 16 Visit Information Patient Arrived: Ambulatory Arrival Time: 09:13 Accompanied By: wife Transfer Assistance: None Patient Identification Verified: Yes Secondary Verification Process Yes Completed: Patient Requires Transmission- No Based Precautions: Patient Has Alerts: Yes Patient Alerts: Patient on Blood Thinner DM II 81mg  aspirin twice daily History Since Last Visit All ordered tests and consults were completed: No Added or deleted any medications: No Any new allergies or adverse reactions: No Had a fall or experienced change in activities of daily living that may affect risk of falls: No Signs or symptoms of abuse/neglect since last visito No Hospitalized since last visit:  No Has Dressing in Place as Prescribed: Yes Electronic Signature(s) Signed: 06/30/2016 1:22:14 PM By: Regan Lemming BSN, RN Entered By: Regan Lemming on 06/30/2016 09:18:07 [name] ([id number]) -------------------------------------------------------------------------------- Encounter Discharge Information Details Patient Name: [name] Date of Service: 06/30/2016 9:15 AM Medical Record Number: [id number] Patient Account Number: [id number] Date of Birth/Sex: 06-22-1940 (76 y.o. Male) Treating RN: Baruch Gouty, RN, BSN, Velva Harman Primary Care Merrily Tegeler: Enid Derry Other Clinician: Referring Tekisha Darcey: En[place] Derry Treating Kina Shiffman/Extender: Frann Rider in Treatment: 16 Encounter Discharge Information Items Discharge Pain Level: 0 Discharge Condition: Stable Ambulatory Status: Ambulatory Discharge Destination: Home Transportation: Private Auto Accompanied By: wife Schedule Follow-up Appointment: No Medication Reconciliation completed and provided to Patient/Care No Ella Guillotte: Provided on Clinical Summary of Care: 06/30/2016 Form Type Recipient Paper Patient FW Electronic Signature(s) Signed: 06/30/2016 10:01:48 AM By: Ruthine Dose Entered By: Ruthine Dose on 06/30/2016 10:01:47 [name] ([id number]) -------------------------------------------------------------------------------- Lower Extremity Assessment Details Patient Name: [name] Date of Service: 06/30/2016 9:15 AM Medical Record Number: [id number] Patient Account Number: [id number] Date of Birth/Sex: Oct 13, 1940 (76 y.o. Male) Treating RN: Baruch Gouty, RN, BSN, Velva Harman Primary Care Sylver Vantassell: Enid Derry Other Clinician: Referring Lyn Joens: [place] Derry Treating Alesa Echevarria/Extender: Frann Rider in Treatment: 16 Edema Assessment Assessed: [Left: No] [Right: No] Edema: [Left: N] [Right: o] Vascular Assessment Claudication: Claudication Assessment [Right:None] Pulses: Dorsalis Pedis Palpable:  [Right:Yes] Posterior Tibial Extremity colors, hair growth, and conditions: Extremity Color: [Right:Normal] Hair Growth on Extremity: [Right:Yes] Temperature of Extremity: [Right:Warm] Capillary Refill: [Right:< 3 seconds] Electronic Signature(s) Signed: 06/30/2016 1:22:14 PM By: Regan Lemming BSN, RN Entered By: Regan Lemming on 06/30/2016 09:18:32 [name] ([id number]) -------------------------------------------------------------------------------- Multi Wound Chart Details Patient Name: [name] Date of Service: 06/30/2016 9:15 AM Medical Record Number: [id number] Patient Account Number: [id number] Date of Birth/Sex: [birth year] (76 y.o. Male) Treating RN: Baruch Gouty, RN, BSN, Velva Harman Primary Care Addalyne Vandehei: Enid Derry Other Clinician: Referring Jeydan Barner: En[place] Derry Treating Erandy Mceachern/Extender: Frann Rider in Treatment: 16 Vital Signs Height(in): 63 Pulse(bpm): 72  Weight(lbs): 136 Blood Pressure 194/77 (mmHg): Body Mass Index(BMI): 21 Temperature(F): 98 Respiratory Rate 16 (breaths/min): Photos: [1:No Photos] [2:No Photos] [3:No Photos] Wound Location: [1:Right Malleolus - Lateral] [2:Right Foot - Dorsal] [3:Right Foot - Plantar] Wounding Event: [1:Gradually Appeared] [2:Gradually Appeared] [3:Pressure Injury] Primary Etiology: [1: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] [3:Diabetic Wound/Ulcer of the Lower Extremity] Secondary Etiology: [1:Diabetic Wound/Ulcer of the Lower Extremity] [2:N/A] [3:N/A] Comorbid History: [1:Cataracts, Chronic sinus problems/congestion, Anemia, Sleep Apnea, Hypertension, Myocardial Infarction, Type II Diabetes, Received Chemotherapy, Received Radiation] [2:Cataracts, Chronic sinus problems/congestion, Anemia, Sleep  Apnea, Hypertension, Myocardial Infarction, Type II Diabetes, Received Chemotherapy, Received Radiation] [3:Cataracts, Chronic sinus problems/congestion, Anemia, Sleep Apnea, Hypertension,  Myocardial Infarction, Type II Diabetes, Received Chemotherapy,  Received Radiation] Date Acquired: [1:01/09/2016] [2:06/02/2016] [3:06/15/2016] Weeks of Treatment: [1:16] [2:3] [3:2] Wound Status: [1:Open] [2:Open] [3:Open] Measurements L x W x D 0.8x0.8x0.3 [2:0.3x0.3x0.1] [3:0.7x0.7x0.1] (cm) Area (cm) : [1:0.503] [2:0.071] [3:0.385] Volume (cm) : [1:0.151] [2:0.007] [3:0.038] % Reduction in Area: [1:-113.10%] [2:63.80%] [3:78.20%] % Reduction in Volume: -529.20% [2:82.10%] [3:98.60%] Classification: [1:Category/Stage III] [2:Grade 1] [3:Grade 1] HBO Classification: [1:Grade 1] [2:N/A] [3:N/A] Exudate Amount: [1:Medium] [2:Small] [3:Medium] Exudate Type: [1:Serous] [2:Serosanguineous] [3:Serosanguineous] Exudate Color: [1:amber] [2:red, brown] [3:red, brown] Wound Margin: [1:Flat and Intact] [2:Flat and Intact] [3:Distinct, outline attached] Granulation Amount: Small (1-33%) None Present (0%) Medium (34-66%) Granulation Quality: Pink, Pale N/A Pink, Pale Necrotic Amount: Large (67-100%) Large (67-100%) Medium (34-66%) Exposed Structures: Fat Layer (Subcutaneous Fat Layer (Subcutaneous Fat Layer (Subcutaneous Tissue) Exposed: Yes Tissue) Exposed: Yes Tissue) Exposed: Yes Fascia: No Fascia: No Fascia: No Tendon: No Tendon: No Tendon: No Muscle: No Muscle: No Muscle: No Joint: No Joint: No Joint: No Bone: No Bone: No Bone: No Epithelialization: None Small (1-33%) None Debridement: Debridement (31540- N/A Debridement (11042- 11047) 11047) Pre-procedure 09:39 N/A 09:37 Verification/Time Out Taken: Pain Control: Lidocaine 4% Topical N/A Lidocaine 4% Topical Solution Solution Tissue Debrided: Fibrin/Slough, Fat, N/A Fibrin/Slough, Fat, Exudates, Subcutaneous Exudates, Subcutaneous Level: Skin/Subcutaneous N/A Skin/Subcutaneous Tissue Tissue Debridement Area (sq 0.64 N/A 0.49 cm): Instrument: Curette N/A Curette Bleeding: Minimum N/A Minimum Hemostasis Achieved:  Pressure N/A Pressure Procedural Pain: 0 N/A 0 Post Procedural Pain: 0 N/A 0 Debridement Treatment Procedure was tolerated N/A Procedure was tolerated Response: well well Post Debridement 0.8x0.8x0.3 N/A 0.7x0.7x0.1 Measurements L x W x D (cm) Post Debridement 0.151 N/A 0.038 Volume: (cm) Post Debridement Category/Stage III N/A N/A Stage: Periwound Skin Texture: Induration: Yes Excoriation: No Callus: Yes Induration: No Excoriation: No Callus: No Induration: No Crepitus: No Crepitus: No Rash: No Rash: No Scarring: No Scarring: No Periwound Skin Maceration: No Maceration: No Maceration: No Moisture: Dry/Scaly: No Dry/Scaly: No Dry/Scaly: No Periwound Skin Color: Erythema: Yes Atrophie Blanche: No Atrophie Blanche: No Cyanosis: No Cyanosis: No Ecchymosis: No Ecchymosis: No Erythema: No Erythema: No DORIN, [place] ([id number]) Hemosiderin Staining: No Hemosiderin Staining: No Mottled: No Mottled: No Pallor: No Pallor: No Rubor: No Rubor: No Erythema Location: Circumferential N/A N/A Erythema Change: Decreased N/A N/A Temperature: No Abnormality No Abnormality No Abnormality Tenderness on Yes Yes Yes Palpation: Wound Preparation: Ulcer Cleansing: Ulcer Cleansing: Ulcer Cleansing: Rinsed/Irrigated with Rinsed/Irrigated with Rinsed/Irrigated with Saline Saline Saline Topical Anesthetic Topical Anesthetic Topical Anesthetic Applied: Other: lidocaine Applied: Other: lidocaine Applied: Other: lidocaine 4% 4% 4% Procedures Performed: Debridement N/A Debridement Treatment Notes Wound #1 (Right, Lateral Malleolus) 1. Cleansed with: Clean wound with Normal Saline 4. Dressing Applied: Aquacel Ag Other dressing (specify in notes) 5. Secondary Dressing Applied Bordered Foam Dressing Dry Gauze 7. Secured  with Tape Notes aguacel to plantar, sorbact to all other areas Wound #2 (Right, Dorsal Foot) 1. Cleansed with: Clean wound with Normal Saline 4.  Dressing Applied: Aquacel Ag Other dressing (specify in notes) 5. Secondary Dressing Applied Bordered Foam Dressing Dry Gauze 7. Secured with Tape Notes aguacel to plantar, sorbact to all other areas KYLAN, LIBERATI ([id number]) Wound #3 (Right, Plantar Foot) 1. Cleansed with: Clean wound with Normal Saline 4. Dressing Applied: Aquacel Ag Other dressing (specify in notes) 5. Secondary Dressing Applied Bordered Foam Dressing Dry Gauze 7. Secured with Tape Notes aguacel to plantar, sorbact to all other areas Electronic Signature(s) Signed: 06/30/2016 9:49:09 AM By: Christin Fudge MD, FACS Entered By: Christin Fudge on 06/30/2016 09:49:09 [name] ([id number]) -------------------------------------------------------------------------------- [place] Details Patient Name: AZARI, HASLER Date of Service: 06/30/2016 9:15 AM Medical Record Number: [id number] Patient Account Number: [id number] Date of Birth/Sex: 1940/04/24 (76 y.o. Male) Treating RN: Baruch Gouty, RN, BSN, [place] Har[place] Primary Care Alishah Schulte: Enid Derry Other Clinician: Referring Romelle Reiley: En[place] Derry Treating Marianela Mandrell/Extender: Frann Rider in Treatment: 16 Active Inactive ` Orientation to the Wound Care Program Nursing Diagnoses: Knowledge deficit related to the wound healing center program Goals: Patient/caregiver will verbalize understanding of the [facility] Program Date Initiated: 03/10/2016 Target Resolution Date: 03/23/2016 Goal Status: Active Interventions: Provide education on orientation to the wound center Notes: ` Soft Tissue Infection Nursing Diagnoses: Impaired tissue integrity Potential for infection: soft tissue Goals: Patient will remain free of wound infection Date Initiated: 03/10/2016 Target Resolution Date: 03/17/2016 Goal Status: Active Interventions: Assess signs and symptoms of infection every visit Notes: ` Wound/Skin Impairment Nursing  Diagnoses: Impaired tissue integrity UCHECHUKWU, DHAWAN ([id number]) Goals: Ulcer/skin breakdown will heal within 14 weeks Date Initiated: 03/10/2016 Target Resolution Date: 03/24/2016 Goal Status: Active Interventions: Assess patient/caregiver ability to obtain necessary supplies Notes: Electronic Signature(s) Signed: 06/30/2016 1:22:14 PM By: Regan Lemming BSN, RN Entered By: Regan Lemming on 06/30/2016 09:37:36 [name] ([id number]) -------------------------------------------------------------------------------- Pain Assessment Details Patient Name: [name] Date of Service: 06/30/2016 9:15 AM Medical Record Number: [id number] Patient Account Number: [id number] Date of Birth/Sex: 1940/05/30 (76 y.o. Male) Treating RN: Baruch Gouty, RN, BSN, Velva Harman Primary Care Latasha Buczkowski: Enid Derry Other Clinician: Referring Angalina Ante: [place] Derry Treating Mariana Goytia/Extender: Frann Rider in Treatment: 16 Active Problems Location of Pain Severity and Description of Pain Patient Has Paino No Site Locations With Dressing Change: No Pain Management and Medication Current Pain Management: Electronic Signature(s) Signed: 06/30/2016 1:22:14 PM By: Regan Lemming BSN, RN Entered By: Regan Lemming on 06/30/2016 09:18:14 [name] ([id number]) -------------------------------------------------------------------------------- Patient/Caregiver Education Details Patient Name: [name] Date of Service: 06/30/2016 9:15 AM Medical Record Number: [id number] Patient Account Number: [id number] Date of Birth/Gender: [birth year] (76 y.o. Male) Treating RN: Baruch Gouty, RN, BSN, Velva Har[place] Primary Care Physician: Enid Derry Other Clinician: Referring Physician: Enid Derry Treating Physician/Extender: Frann Rider in Treatment: 16 Education Assessment Education Provided To: Patient and Caregiver Education Topics Provided Welcome To The [facility]: Methods: Explain/Verbal Responses:  State content correctly Wound Debridement: Methods: Explain/Verbal Responses: State content correctly Wound/Skin Impairment: Methods: Explain/Verbal Responses: State content correctly Electronic Signature(s) Signed: 06/30/2016 1:22:14 PM By: Regan Lemming BSN, RN Entered By: Regan Lemming on 06/30/2016 09:43:39 [name] ([id number]) -------------------------------------------------------------------------------- Wound Assessment Details Patient Name: [name] Date of Service: 06/30/2016 9:15 AM Medical Record Number: [id number] Patient Account Number: [id number] Date of Birth/Sex: 11-Jul-1940 (76 y.o. Male) Treating RN: Baruch Gouty, RN, BSN, [facility] Primary Care Abrahan Fulmore: Sanda Klein,  Rip Harbour Other Clinician: Referring Odysseus Cada: Enid Derry Treating Michel Eskelson/Extender: Frann Rider in Treatment: 16 Wound Status Wound Number: 1 Primary Pressure Ulcer Etiology: Wound Location: Right Malleolus - Lateral Secondary Diabetic Wound/Ulcer of the Lower Wounding Event: Gradually Appeared Etiology: Extremity Date Acquired: 01/09/2016 Wound Open Weeks Of Treatment: 16 Status: Clustered Wound: No Comorbid Cataracts, Chronic sinus History: problems/congestion, Anemia, Sleep Apnea, Hypertension, Myocardial Infarction, Type II Diabetes, Received Chemotherapy, Received Radiation Photos Photo Uploaded By: Regan Lemming on 06/30/2016 11:27:33 Wound Measurements Length: (cm) 0.8 Width: (cm) 0.8 Depth: (cm) 0.3 Area: (cm) 0.503 Volume: (cm) 0.151 % Reduction in Area: -113.1% % Reduction in Volume: -529.2% Epithelialization: None Tunneling: No Undermining: No Wound Description 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 Severity (Wagner): Grade 1 Slough/Fibrin Wound Margin: Flat and Intact Exudate Amount: Medium Exudate Type: Serous Exudate Color: amber PERSEUS, WESTALL ([id number]) er Cleansing: No o Yes Wound Bed Granulation Amount: Small (1-33%) Exposed  Structure Granulation Quality: Pink, Pale Fascia Exposed: No Necrotic Amount: Large (67-100%) Fat Layer (Subcutaneous Tissue) Exposed: Yes Necrotic Quality: Adherent Slough Tendon Exposed: No Muscle Exposed: No Joint Exposed: No Bone Exposed: No Periwound Skin Texture Texture Color No Abnormalities Noted: No No Abnormalities Noted: No Induration: Yes Erythema: Yes Erythema Location: Circumferential Moisture Erythema Change: Decreased No Abnormalities Noted: No Dry / Scaly: No Temperature / Pain Maceration: No Temperature: No Abnormality Tenderness on Palpation: Yes Wound Preparation Ulcer Cleansing: Rinsed/Irrigated with Saline Topical Anesthetic Applied: Other: lidocaine 4%, Treatment Notes Wound #1 (Right, Lateral Malleolus) 1. Cleansed with: Clean wound with Normal Saline 4. Dressing Applied: Aquacel Ag Other dressing (specify in notes) 5. Secondary Dressing Applied Bordered Foam Dressing Dry Gauze 7. Secured with Tape Notes aguacel to plantar, sorbact to all other areas Electronic Signature(s) Signed: 06/30/2016 1:22:14 PM By: Regan Lemming BSN, RN Entered By: Regan Lemming on 06/30/2016 09:28:13 [name] ([id number]) -------------------------------------------------------------------------------- Wound Assessment Details Patient Name: [name] Date of Service: 06/30/2016 9:15 AM Medical Record Number: [id number] Patient Account Number: [id number] Date of Birth/Sex: 1940/03/13 (76 y.o. Male) Treating RN: Baruch Gouty, RN, BSN, [place] Primary Care Delmon Andrada: Enid Derry Other Clinician: Referring Tilia Faso: En[place] Derry Treating Taige Housman/Extender: Frann Rider in Treatment: 16 Wound Status Wound Number: 2 Primary Diabetic Wound/Ulcer of the Lower Etiology: Extremity Wound Location: Right Foot - Dorsal Wound Open Wounding Event: Gradually Appeared Status: Date Acquired: 06/02/2016 Comorbid Cataracts, Chronic sinus Weeks Of Treatment:  3 History: problems/congestion, Anemia, Sleep Clustered Wound: No Apnea, Hypertension, Myocardial Infarction, Type II Diabetes, Received Chemotherapy, Received Radiation Photos Photo Uploaded By: Regan Lemming on 06/30/2016 11:27:34 Wound Measurements Length: (cm) 0.3 Width: (cm) 0.3 Depth: (cm) 0.1 Area: (cm) 0.071 Volume: (cm) 0.007 % Reduction in Area: 63.8% % Reduction in Volume: 82.1% Epithelialization: Small (1-33%) Tunneling: No Undermining: No Wound Description Classification: Grade 1 Foul Odor Aft Wound Margin: Flat and Intact Slough/Fibrin IGNACE, MANDIGO ([id number]) er Cleansing: No o Yes Exudate Amount: Small Exudate Type: Serosanguineous Exudate Color: red, brown Wound Bed Granulation Amount: None Present (0%) Exposed Structure Necrotic Amount: Large (67-100%) Fascia Exposed: No Necrotic Quality: Adherent Slough Fat Layer (Subcutaneous Tissue) Exposed: Yes Tendon Exposed: No Muscle Exposed: No Joint Exposed: No Bone Exposed: No Periwound Skin Texture Texture Color No Abnormalities Noted: No No Abnormalities Noted: No Callus: No Atrophie Blanche: No Crepitus: No Cyanosis: No Excoriation: No Ecchymosis: No Induration: No Erythema: No Rash: No Hemosiderin Staining: No Scarring: No Mottled: No Pallor: No Moisture Rubor: No No Abnormalities Noted: No Dry / Scaly: No Temperature /  Pain Maceration: No Temperature: No Abnormality Tenderness on Palpation: Yes Wound Preparation Ulcer Cleansing: Rinsed/Irrigated with Saline Topical Anesthetic Applied: Other: lidocaine 4%, Treatment Notes Wound #2 (Right, Dorsal Foot) 1. Cleansed with: Clean wound with Normal Saline 4. Dressing Applied: Aquacel Ag Other dressing (specify in notes) 5. Secondary Dressing Applied Bordered Foam Dressing Dry Gauze 7. Secured with Tape Notes aguacel to plantar, sorbact to all other areas CHATHAM, HOWINGTON ([id number]) Electronic Signature(s) Signed:  06/30/2016 1:22:14 PM By: Regan Lemming BSN, RN Entered By: Regan Lemming on 06/30/2016 09:28:29 [name] ([id number]) -------------------------------------------------------------------------------- Wound Assessment Details Patient Name: [name] Date of Service: 06/30/2016 9:15 AM Medical Record Number: [id number] Patient Account Number: [id number] Date of Birth/Sex: 12-22-1940 (76 y.o. Male) Treating RN: Baruch Gouty, RN, BSN, [place] Primary Care Lonni Dirden: En[place] Derry Other Clinician: Referring Aquilla Voiles: [place] Derry Treating [place] Ice/Extender: Frann Rider in Treatment: 16 Wound Status Wound Number: 3 Primary Diabetic Wound/Ulcer of the Lower Etiology: Extremity Wound Location: Right Foot - Plantar Wound Open Wounding Event: Pressure Injury Status: Date Acquired: 06/15/2016 Comorbid Cataracts, Chronic sinus Weeks Of Treatment: 2 History: problems/congestion, Anemia, Sleep Clustered Wound: No Apnea, Hypertension, Myocardial Infarction, Type II Diabetes, Received Chemotherapy, Received Radiation Photos Photo Uploaded By: Regan Lemming on 06/30/2016 11:28:39 Wound Measurements Length: (cm) 0.7 Width: (cm) 0.7 Depth: (cm) 0.1 Area: (cm) 0.385 Volume: (cm) 0.038 % Reduction in Area: 78.2% % Reduction in Volume: 98.6% Epithelialization: None Tunneling: No Undermining: No Wound Description Classification: Grade 1 Foul Odor Aft Wound Margin: Distinct, outline attached Slough/Fibrin Exudate Amount: Medium Exudate Type: Serosanguineous Exudate Color: red, brown er Cleansing: No o Yes Wound Bed Granulation Amount: Medium (34-66%) Exposed Structure Granulation Quality: Pink, Pale Fascia Exposed: No TARRY, FOUNTAIN ([id number]) Necrotic Amount: Medium (34-66%) Fat Layer (Subcutaneous Tissue) Exposed: Yes Necrotic Quality: Adherent Slough Tendon Exposed: No Muscle Exposed: No Joint Exposed: No Bone Exposed: No Periwound Skin Texture Texture Color No  Abnormalities Noted: No No Abnormalities Noted: No Callus: Yes Atrophie Blanche: No Crepitus: No Cyanosis: No Excoriation: No Ecchymosis: No Induration: No Erythema: No Rash: No Hemosiderin Staining: No Scarring: No Mottled: No Pallor: No Moisture Rubor: No No Abnormalities Noted: No Dry / Scaly: No Temperature / Pain Maceration: No Temperature: No Abnormality Tenderness on Palpation: Yes Wound Preparation Ulcer Cleansing: Rinsed/Irrigated with Saline Topical Anesthetic Applied: Other: lidocaine 4%, Treatment Notes Wound #3 (Right, Plantar Foot) 1. Cleansed with: Clean wound with Normal Saline 4. Dressing Applied: Aquacel Ag Other dressing (specify in notes) 5. Secondary Dressing Applied Bordered Foam Dressing Dry Gauze 7. Secured with Tape Notes aguacel to plantar, sorbact to all other areas Electronic Signature(s) Signed: 06/30/2016 1:22:14 PM By: Regan Lemming BSN, RN Entered By: Regan Lemming on 06/30/2016 02:33:43 [name] ([id number]) -------------------------------------------------------------------------------- [place] Details Patient Name: [name] Date of Service: 06/30/2016 9:15 AM Medical Record Number: [id number] Patient Account Number: [id number] Date of Birth/Sex: 1941/01/19 (76 y.o. Male) Treating RN: Baruch Gouty, RN, BSN, [place] Primary Care Luana Tatro: Enid Derry Other Clinician: Referring Arantxa Piercey: En[place] Derry Treating Adonte Vanriper/Extender: Frann Rider in Treatment: 16 Vital Signs Time Taken: 09:19 Temperature (F): 98 Height (in): 67 Pulse (bpm): 72 Weight (lbs): 136 Respiratory Rate (breaths/min): 16 Body Mass Index (BMI): 21.3 Blood Pressure (mmHg): 139/63 Reference Range: 80 - 120 mg / dl Electronic Signature(s) Signed: 06/30/2016 1:22:14 PM By: Regan Lemming BSN, RN Entered By: Regan Lemming on 06/30/2016 10:00:47

## 2016-07-03 ENCOUNTER — Inpatient Hospital Stay: Payer: Medicare Other

## 2016-07-03 ENCOUNTER — Inpatient Hospital Stay (HOSPITAL_BASED_OUTPATIENT_CLINIC_OR_DEPARTMENT_OTHER): Payer: Medicare Other | Admitting: Oncology

## 2016-07-03 VITALS — BP 153/72 | HR 73 | Temp 98.1°F | Resp 20 | Wt 132.9 lb

## 2016-07-03 DIAGNOSIS — G4730 Sleep apnea, unspecified: Secondary | ICD-10-CM

## 2016-07-03 DIAGNOSIS — Z7982 Long term (current) use of aspirin: Secondary | ICD-10-CM

## 2016-07-03 DIAGNOSIS — I714 Abdominal aortic aneurysm, without rupture: Secondary | ICD-10-CM

## 2016-07-03 DIAGNOSIS — R5383 Other fatigue: Secondary | ICD-10-CM

## 2016-07-03 DIAGNOSIS — R634 Abnormal weight loss: Secondary | ICD-10-CM

## 2016-07-03 DIAGNOSIS — Z7984 Long term (current) use of oral hypoglycemic drugs: Secondary | ICD-10-CM

## 2016-07-03 DIAGNOSIS — C155 Malignant neoplasm of lower third of esophagus: Secondary | ICD-10-CM

## 2016-07-03 DIAGNOSIS — I517 Cardiomegaly: Secondary | ICD-10-CM

## 2016-07-03 DIAGNOSIS — Z87891 Personal history of nicotine dependence: Secondary | ICD-10-CM

## 2016-07-03 DIAGNOSIS — Z5111 Encounter for antineoplastic chemotherapy: Secondary | ICD-10-CM | POA: Diagnosis not present

## 2016-07-03 DIAGNOSIS — C787 Secondary malignant neoplasm of liver and intrahepatic bile duct: Secondary | ICD-10-CM

## 2016-07-03 DIAGNOSIS — L97319 Non-pressure chronic ulcer of right ankle with unspecified severity: Secondary | ICD-10-CM | POA: Diagnosis not present

## 2016-07-03 DIAGNOSIS — Z8673 Personal history of transient ischemic attack (TIA), and cerebral infarction without residual deficits: Secondary | ICD-10-CM

## 2016-07-03 DIAGNOSIS — E039 Hypothyroidism, unspecified: Secondary | ICD-10-CM

## 2016-07-03 DIAGNOSIS — R918 Other nonspecific abnormal finding of lung field: Secondary | ICD-10-CM

## 2016-07-03 DIAGNOSIS — I10 Essential (primary) hypertension: Secondary | ICD-10-CM

## 2016-07-03 DIAGNOSIS — Z79899 Other long term (current) drug therapy: Secondary | ICD-10-CM

## 2016-07-03 DIAGNOSIS — K922 Gastrointestinal hemorrhage, unspecified: Secondary | ICD-10-CM

## 2016-07-03 DIAGNOSIS — E119 Type 2 diabetes mellitus without complications: Secondary | ICD-10-CM

## 2016-07-03 DIAGNOSIS — C801 Malignant (primary) neoplasm, unspecified: Secondary | ICD-10-CM

## 2016-07-03 DIAGNOSIS — R531 Weakness: Secondary | ICD-10-CM

## 2016-07-03 DIAGNOSIS — Z801 Family history of malignant neoplasm of trachea, bronchus and lung: Secondary | ICD-10-CM

## 2016-07-03 DIAGNOSIS — G629 Polyneuropathy, unspecified: Secondary | ICD-10-CM | POA: Diagnosis not present

## 2016-07-03 DIAGNOSIS — R490 Dysphonia: Secondary | ICD-10-CM

## 2016-07-03 DIAGNOSIS — D509 Iron deficiency anemia, unspecified: Secondary | ICD-10-CM | POA: Diagnosis not present

## 2016-07-03 DIAGNOSIS — M5134 Other intervertebral disc degeneration, thoracic region: Secondary | ICD-10-CM

## 2016-07-03 LAB — CBC WITH DIFFERENTIAL/PLATELET
BASOS ABS: 0.1 10*3/uL (ref 0–0.1)
BASOS PCT: 1 %
EOS PCT: 1 %
Eosinophils Absolute: 0.1 10*3/uL (ref 0–0.7)
HCT: 25.7 % — ABNORMAL LOW (ref 40.0–52.0)
Hemoglobin: 8.7 g/dL — ABNORMAL LOW (ref 13.0–18.0)
Lymphocytes Relative: 6 %
Lymphs Abs: 0.5 10*3/uL — ABNORMAL LOW (ref 1.0–3.6)
MCH: 28.4 pg (ref 26.0–34.0)
MCHC: 33.7 g/dL (ref 32.0–36.0)
MCV: 84.1 fL (ref 80.0–100.0)
MONO ABS: 0.8 10*3/uL (ref 0.2–1.0)
Monocytes Relative: 11 %
Neutro Abs: 6.2 10*3/uL (ref 1.4–6.5)
Neutrophils Relative %: 81 %
PLATELETS: 448 10*3/uL — AB (ref 150–440)
RBC: 3.06 MIL/uL — ABNORMAL LOW (ref 4.40–5.90)
RDW: 21.4 % — AB (ref 11.5–14.5)
WBC: 7.6 10*3/uL (ref 3.8–10.6)

## 2016-07-03 LAB — COMPREHENSIVE METABOLIC PANEL
ALBUMIN: 3.5 g/dL (ref 3.5–5.0)
ALT: 17 U/L (ref 17–63)
ANION GAP: 10 (ref 5–15)
AST: 32 U/L (ref 15–41)
Alkaline Phosphatase: 72 U/L (ref 38–126)
BUN: 15 mg/dL (ref 6–20)
CHLORIDE: 95 mmol/L — AB (ref 101–111)
CO2: 26 mmol/L (ref 22–32)
Calcium: 9.2 mg/dL (ref 8.9–10.3)
Creatinine, Ser: 0.61 mg/dL (ref 0.61–1.24)
GFR calc Af Amer: >60 mL/min (ref >60)
Glucose, Bld: 183 mg/dL — ABNORMAL HIGH (ref 65–99)
POTASSIUM: 4 mmol/L (ref 3.5–5.1)
Sodium: 131 mmol/L — ABNORMAL LOW (ref 135–145)
Total Bilirubin: 0.3 mg/dL (ref 0.3–1.2)
Total Protein: 7 g/dL (ref 6.5–8.1)

## 2016-07-03 MED ORDER — HEPARIN SOD (PORK) LOCK FLUSH 100 UNIT/ML IV SOLN
INTRAVENOUS | Status: AC
  Filled 2016-07-03: qty 5

## 2016-07-03 MED ORDER — SODIUM CHLORIDE 0.9% FLUSH
10.0000 mL | Freq: Once | INTRAVENOUS | Status: AC
  Administered 2016-07-03: 10 mL via INTRAVENOUS
  Filled 2016-07-03: qty 10

## 2016-07-03 MED ORDER — LOPERAMIDE HCL 2 MG PO TABS: ORAL_TABLET | ORAL | 1 refills | Status: DC

## 2016-07-03 MED ORDER — HEPARIN SOD (PORK) LOCK FLUSH 100 UNIT/ML IV SOLN
500.0000 [IU] | Freq: Once | INTRAVENOUS | Status: AC
  Administered 2016-07-03: 500 [IU] via INTRAVENOUS

## 2016-07-03 NOTE — Progress Notes (Signed)
Patient reports persistent hoarseness.

## 2016-07-04 ENCOUNTER — Encounter: Payer: Self-pay | Admitting: Cardiovascular Disease

## 2016-07-04 ENCOUNTER — Ambulatory Visit (INDEPENDENT_AMBULATORY_CARE_PROVIDER_SITE_OTHER): Payer: Medicare Other | Admitting: Cardiovascular Disease

## 2016-07-04 VITALS — BP 134/64 | HR 78 | Ht 67.0 in | Wt 133.5 lb

## 2016-07-04 DIAGNOSIS — I2510 Atherosclerotic heart disease of native coronary artery without angina pectoris: Secondary | ICD-10-CM | POA: Diagnosis not present

## 2016-07-04 DIAGNOSIS — E785 Hyperlipidemia, unspecified: Secondary | ICD-10-CM | POA: Diagnosis not present

## 2016-07-04 DIAGNOSIS — I10 Essential (primary) hypertension: Secondary | ICD-10-CM | POA: Diagnosis not present

## 2016-07-04 LAB — CANCER ANTIGEN 19-9: CA 19 9: 43052 U/mL — AB (ref 0–35)

## 2016-07-04 LAB — CEA: CEA: 746.9 ng/mL — ABNORMAL HIGH (ref 0.0–4.7)

## 2016-07-04 MED ORDER — ISOSORBIDE MONONITRATE ER 60 MG PO TB24
60.0000 mg | ORAL_TABLET | Freq: Every day | ORAL | 5 refills | Status: DC
Start: 2016-07-04 — End: 2016-07-04

## 2016-07-04 MED ORDER — ISOSORBIDE MONONITRATE ER 60 MG PO TB24: 60.0000 mg | ORAL_TABLET | Freq: Every day | ORAL | 6 refills | Status: DC

## 2016-07-04 NOTE — Patient Instructions (Signed)
Medication Instructions: Continue same medications.   Labwork: None.   Procedures/Testing: None.   Follow-Up: 6 months with Dr. Marajade Lei.   Any Additional Special Instructions Will Be Listed Below (If Applicable).     If you need a refill on your cardiac medications before your next appointment, please call your pharmacy.   

## 2016-07-04 NOTE — Progress Notes (Signed)
Cardiology Office Note   Date:  07/04/2016   ID:  [name], DOB Aug 11, 1940, MRN [id number]  PCP:  Arnetha Courser, MD  Cardiologist:   Kathlyn Sacramento, MD   Chief Complaint  Patient presents with  . OTHER    6 month f/u no complaints today. Meds reviewed verbally with pt.      History of Present Illness: [name] is a 76 y.o. male who is here today for a follow-up visit regarding coronary artery disease.  He has multiple chronic medical conditions that include type 2 diabetes, hypothyroidism, previous stroke and mild abdominal aortic ectasia. He was hospitalized in August, 2016 at [facility] with urinary tract infection and was found to have severe anemia with a hemoglobin of 5.4. He was transfused but it appears that he developed volume overload and subsequently was found to have elevated troponin which peaked at 47. He had an echocardiogram done which showed an ejection fraction of 40-45% with anterior wall akinesis. His previous ejection fraction in 2012 was normal. The patient was treated medically and stabilized. Further ischemic cardiac evaluation was declined by the patient and his wife. He had a repeat echocardiogram in March 2017 which showed an ejection fraction of 55%.  He was diagnosed with esophageal cancer last year and currently undergoing chemotherapy. He has known liver metastases. He was having routine MUGA scans while he was on Herceptin. Most recently this showed an ejection fraction of 53%. The patient denies significant chest pain or shortness of breath although he is overall a poor historian.    Past Medical History:  Diagnosis Date  . Anemia   . Arthritis   . 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 Diabetes mellitus without complication ([place])   . Ectatic abdominal aorta ([place]) 08/12/2015  . Esophageal cancer ([place])   . GI bleed   . Hearing loss    left ear  . Hypertension   . Hypothyroidism   . Liver mass   . Mild left atrial  enlargement 08/12/2015   Very mild; LA 4.2 cm; echo March 2017  . Mitral valvular regurgitation 08/12/2015  . Sleep apnea   . Stroke ([place])   . TIA (transient ischemic attack)   . 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 APPENDECTOMY    . EUS N/A 10/07/2015   Procedure: FULL UPPER ENDOSCOPIC ULTRASOUND (EUS) RADIAL;  Surgeon: Holly Bodily, MD;  Location: [facility] ENDOSCOPY;  Service: Endoscopy;  Laterality: N/A;  . EYE SURGERY    . FRACTURE SURGERY    . NOSE SURGERY    . PERIPHERAL VASCULAR CATHETERIZATION N/A 10/26/2015   Procedure: Glori Luis Cath Insertion;  Surgeon: Katha Cabal, MD;  Location: [place] [facility];  Service: Cardiovascular;  Laterality: N/A;     Current Outpatient Prescriptions  Medication Sig Dispense Refill  . aspirin EC 81 MG tablet Take 81 mg by mouth 2 (two) times daily.     Marland Kitchen BIOTIN PO Take by mouth.    . Cholecalciferol (VITAMIN D3) 1000 UNITS CAPS Take 2,000 Units by mouth daily.     . Coenzyme Q10 (CO Q-10) 200 MG CAPS Take 200 mg by mouth daily.     Marland Kitchen glucose blood (FREESTYLE LITE) test strip Use 2 (two) times daily. Free style lite test strips    . isosorbide mononitrate (IMDUR) 60 MG 24 hr tablet Take 1 tablet (60 mg  total) by mouth daily. 30 tablet 6  . levothyroxine (SYNTHROID, LEVOTHROID) 100 MCG tablet Take 100 mcg by mouth every morning.    . lidocaine-prilocaine (EMLA) cream Apply 1 application topically as needed. Apply to port 1-2 hours prior to chemotherapy. Cover with plastic wrap. 30 g 2  . loperamide (IMODIUM A-D) 2 MG tablet Take 2 at diarrhea onset, then 1 every 2hr until 12hrs with no BM. May take 2 every 4hrs at night. If diarrhea recurs repeat. 100 tablet 1  . Magnesium Oxide 250 MG TABS Take 1 tablet (250 mg total) by mouth daily.  0  . metFORMIN (GLUCOPHAGE) 1000 MG tablet Take 1,000 mg by mouth daily.     . metoprolol tartrate (LOPRESSOR) 25 MG tablet Take  12.5 mg by mouth 2 (two) times daily.     . Misc Natural Products (BEE PROPOLIS PO) Take 1 tablet by mouth 2 (two) times daily.     . Multiple Vitamins-Minerals (MULTI FOR HIM 50+ PO) Take 1 tablet by mouth daily.    . prochlorperazine (COMPAZINE) 10 MG tablet Take 1 tablet (10 mg total) by mouth every 6 (six) hours as needed for nausea or vomiting. 30 tablet 2  . Red Yeast Rice 600 MG TABS Take 2 tablets by mouth at bedtime.    . sucralfate (CARAFATE) 1 g tablet Take 1 tablet (1 g total) by mouth 3 (three) times daily. Dissolve each tablet in 2-3 tbsp warm water, swish and swallow. 90 tablet 2  . Wound Dressings (MEDIHONEY WOUND/BURN DRESSING) PSTE   0   No current facility-administered medications for this visit.    Facility-Administered Medications Ordered in Other Visits  Medication Dose Route Frequency Provider Last Rate Last Dose  . 0.9 %  sodium chloride infusion   Intravenous Continuous Lloyd Huger, MD        Allergies:   Sulfa antibiotics; Amoxicillin; Ampicillin; Keflex [cephalexin]; Lisinopril; Losartan; Sulfamethoxazole-trimethoprim; and Cephalosporins    Social History:  The patient  reports that he quit smoking about 32 years ago. His smoking use included Cigarettes. He smoked 1.00 pack per day. He has never used smokeless tobacco. He reports that he drinks alcohol. He reports that he does not use drugs.   Family History:  The patient's family history includes Hypertension in his brother and mother; Lung cancer in his father.    ROS:  Please see the history of present illness.   Otherwise, review of systems are positive for none.   All other systems are reviewed and negative.    PHYSICAL EXAM: VS:  BP 134/64 (BP Location: Left Arm, Patient Position: Sitting, Cuff Size: Normal)   Pulse 78   Ht 5\' 7"  (1.702 m)   Wt 133 lb 8 oz (60.6 kg)   BMI 20.91 kg/m  , BMI Body mass index is 20.91 kg/m. GEN: Well nourished, well developed, in no acute distress  HEENT: normal   Neck: no JVD, carotid bruits, or masses Cardiac: RRR; no murmurs, rubs, or gallops,no edema  Respiratory:  clear to auscultation bilaterally, normal work of breathing GI: soft, nontender, nondistended, + BS MS: no deformity or atrophy  Skin: warm and dry, no rash Neuro:  Strength and sensation are intact Psych: euthymic mood, full affect   EKG:  EKG is ordered today. The ekg ordered today demonstrates normal sinus rhythm with right bundle branch block and left anterior fascicular block   Recent Labs: 05/08/2016: Magnesium 2.1; TSH 1.479 07/03/2016: ALT 17; BUN 15; Creatinine, Ser 0.61; Hemoglobin  8.7; Platelets 448; Potassium 4.0; Sodium 131    Lipid Panel    Component Value Date/Time   CHOL 159 08/12/2015 1235   TRIG 157 (H) 08/12/2015 1235   HDL 41 08/12/2015 1235   CHOLHDL 3.9 08/12/2015 1235   VLDL 31 (H) 08/12/2015 1235   LDLCALC 87 08/12/2015 1235      Wt Readings from Last 3 Encounters:  07/04/16 133 lb 8 oz (60.6 kg)  07/03/16 132 lb 14.4 oz (60.3 kg)  06/19/16 131 lb 1.6 oz (59.5 kg)        ASSESSMENT AND PLAN:  1.  Coronary artery disease involving native coronary arteries without angina:  Stable symptoms overall. Continue medical therapy.  2. Hyperlipidemia:  Treatment with a statin will decline in the past by the wife. The patient takes Red yeast Rice.   3. 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 Followed by oncology.   Disposition:   FU with me in 6 months  Signed,  Kathlyn Sacramento, MD  07/04/2016 4:55 PM    [facility]

## 2016-07-05 ENCOUNTER — Inpatient Hospital Stay: Payer: Medicare Other

## 2016-07-05 VITALS — BP 123/71 | HR 71 | Temp 96.0°F | Resp 20

## 2016-07-05 DIAGNOSIS — C787 Secondary malignant neoplasm of liver and intrahepatic bile duct: Secondary | ICD-10-CM | POA: Diagnosis not present

## 2016-07-05 DIAGNOSIS — C155 Malignant neoplasm of lower third of esophagus: Secondary | ICD-10-CM

## 2016-07-05 DIAGNOSIS — Z5111 Encounter for antineoplastic chemotherapy: Secondary | ICD-10-CM | POA: Diagnosis not present

## 2016-07-05 DIAGNOSIS — D509 Iron deficiency anemia, unspecified: Secondary | ICD-10-CM | POA: Diagnosis not present

## 2016-07-05 DIAGNOSIS — R634 Abnormal weight loss: Secondary | ICD-10-CM | POA: Diagnosis not present

## 2016-07-05 DIAGNOSIS — R5383 Other fatigue: Secondary | ICD-10-CM | POA: Diagnosis not present

## 2016-07-05 MED ORDER — SODIUM CHLORIDE 0.9 % IV SOLN
Freq: Once | INTRAVENOUS | Status: AC
  Administered 2016-07-05: 11:00:00 via INTRAVENOUS
  Filled 2016-07-05: qty 5

## 2016-07-05 MED ORDER — SODIUM CHLORIDE 0.9 % IV SOLN
30.0000 mg/m2 | Freq: Once | INTRAVENOUS | Status: AC
  Administered 2016-07-05: 50 mg via INTRAVENOUS
  Filled 2016-07-05: qty 50

## 2016-07-05 MED ORDER — PALONOSETRON HCL INJECTION 0.25 MG/5ML
0.2500 mg | Freq: Once | INTRAVENOUS | Status: AC
  Administered 2016-07-05: 0.25 mg via INTRAVENOUS
  Filled 2016-07-05: qty 5

## 2016-07-05 MED ORDER — ATROPINE SULFATE 1 MG/ML IJ SOLN
0.5000 mg | Freq: Once | INTRAMUSCULAR | Status: AC | PRN
  Administered 2016-07-05: 0.5 mg via INTRAVENOUS
  Filled 2016-07-05: qty 1

## 2016-07-05 MED ORDER — IRINOTECAN HCL CHEMO INJECTION 100 MG/5ML
65.0000 mg/m2 | Freq: Once | INTRAVENOUS | Status: AC
  Administered 2016-07-05: 100 mg via INTRAVENOUS
  Filled 2016-07-05: qty 5

## 2016-07-05 MED ORDER — SODIUM CHLORIDE 0.9% FLUSH
10.0000 mL | INTRAVENOUS | Status: DC | PRN
  Administered 2016-07-05: 10 mL
  Filled 2016-07-05: qty 10

## 2016-07-05 MED ORDER — SODIUM CHLORIDE 0.9 % IV SOLN
Freq: Once | INTRAVENOUS | Status: AC
  Administered 2016-07-05: 09:00:00 via INTRAVENOUS
  Filled 2016-07-05: qty 1000

## 2016-07-05 MED ORDER — POTASSIUM CHLORIDE 2 MEQ/ML IV SOLN
Freq: Once | INTRAVENOUS | Status: AC
  Administered 2016-07-05: 09:00:00 via INTRAVENOUS
  Filled 2016-07-05: qty 10

## 2016-07-05 MED ORDER — HEPARIN SOD (PORK) LOCK FLUSH 100 UNIT/ML IV SOLN
500.0000 [IU] | Freq: Once | INTRAVENOUS | Status: AC | PRN
  Administered 2016-07-05: 500 [IU]
  Filled 2016-07-05: qty 5

## 2016-07-06 ENCOUNTER — Encounter: Payer: Medicare Other | Admitting: Surgery

## 2016-07-06 ENCOUNTER — Other Ambulatory Visit: Payer: Self-pay | Admitting: Hematology and Oncology

## 2016-07-06 ENCOUNTER — Telehealth: Payer: Self-pay | Admitting: *Deleted

## 2016-07-06 DIAGNOSIS — L97512 Non-pressure chronic ulcer of other part of right foot with fat layer exposed: Secondary | ICD-10-CM | POA: Diagnosis not present

## 2016-07-06 DIAGNOSIS — L89521 Pressure ulcer of left ankle, stage 1: Secondary | ICD-10-CM | POA: Diagnosis not present

## 2016-07-06 DIAGNOSIS — C155 Malignant neoplasm of lower third of esophagus: Secondary | ICD-10-CM | POA: Diagnosis not present

## 2016-07-06 DIAGNOSIS — E441 Mild protein-calorie malnutrition: Secondary | ICD-10-CM | POA: Diagnosis not present

## 2016-07-06 DIAGNOSIS — L89512 Pressure ulcer of right ankle, stage 2: Secondary | ICD-10-CM | POA: Diagnosis not present

## 2016-07-06 DIAGNOSIS — E11621 Type 2 diabetes mellitus with foot ulcer: Secondary | ICD-10-CM | POA: Diagnosis not present

## 2016-07-06 DIAGNOSIS — D649 Anemia, unspecified: Secondary | ICD-10-CM | POA: Diagnosis not present

## 2016-07-06 NOTE — Telephone Encounter (Addendum)
Wife called to that patient is having stomach pains, "NOT nausea, but pain" He is also flushed and temp is 99. Asking what they should do. He had Cis/Irinotecan yesterday. Please advise

## 2016-07-06 NOTE — Telephone Encounter (Signed)
Advised per Dr Mike Gip to go to ER for evaluation, she did not want to take him. I told her that the flushing is most likely from the chemo and that if his pain gets worse or temp >100.5 to take him to the ER. I asked that she call back tomorrow if his sx persist or worsen. She repeated back to me

## 2016-07-07 ENCOUNTER — Ambulatory Visit: Payer: Medicare Other | Admitting: Surgery

## 2016-07-07 ENCOUNTER — Inpatient Hospital Stay: Payer: Medicare Other

## 2016-07-08 ENCOUNTER — Other Ambulatory Visit: Payer: Self-pay

## 2016-07-08 ENCOUNTER — Emergency Department: Payer: Medicare Other

## 2016-07-08 ENCOUNTER — Emergency Department
Admission: EM | Admit: 2016-07-08 | Discharge: 2016-07-08 | Disposition: A | Discharge status: Home / Self Care | Payer: Medicare Other | Attending: Emergency Medicine | Admitting: Emergency Medicine

## 2016-07-08 ENCOUNTER — Encounter: Payer: Self-pay | Admitting: Emergency Medicine

## 2016-07-08 DIAGNOSIS — Z7984 Long term (current) use of oral hypoglycemic drugs: Secondary | ICD-10-CM | POA: Insufficient documentation

## 2016-07-08 DIAGNOSIS — Z8673 Personal history of transient ischemic attack (TIA), and cerebral infarction without residual deficits: Secondary | ICD-10-CM | POA: Insufficient documentation

## 2016-07-08 DIAGNOSIS — Z7982 Long term (current) use of aspirin: Secondary | ICD-10-CM | POA: Diagnosis not present

## 2016-07-08 DIAGNOSIS — C787 Secondary malignant neoplasm of liver and intrahepatic bile duct: Secondary | ICD-10-CM | POA: Diagnosis not present

## 2016-07-08 DIAGNOSIS — E0590 Thyrotoxicosis, unspecified without thyrotoxic crisis or storm: Secondary | ICD-10-CM | POA: Diagnosis not present

## 2016-07-08 DIAGNOSIS — I10 Essential (primary) hypertension: Secondary | ICD-10-CM | POA: Diagnosis not present

## 2016-07-08 DIAGNOSIS — Z87891 Personal history of nicotine dependence: Secondary | ICD-10-CM | POA: Diagnosis not present

## 2016-07-08 DIAGNOSIS — E119 Type 2 diabetes mellitus without complications: Secondary | ICD-10-CM | POA: Insufficient documentation

## 2016-07-08 DIAGNOSIS — R109 Unspecified abdominal pain: Secondary | ICD-10-CM | POA: Diagnosis not present

## 2016-07-08 DIAGNOSIS — Z79899 Other long term (current) drug therapy: Secondary | ICD-10-CM | POA: Insufficient documentation

## 2016-07-08 DIAGNOSIS — E871 Hypo-osmolality and hyponatremia: Secondary | ICD-10-CM | POA: Diagnosis not present

## 2016-07-08 DIAGNOSIS — C155 Malignant neoplasm of lower third of esophagus: Secondary | ICD-10-CM | POA: Diagnosis not present

## 2016-07-08 DIAGNOSIS — R918 Other nonspecific abnormal finding of lung field: Secondary | ICD-10-CM | POA: Diagnosis not present

## 2016-07-08 HISTORY — DX: Heart failure, unspecified: I50.9

## 2016-07-08 LAB — COMPREHENSIVE METABOLIC PANEL
ALBUMIN: 3.5 g/dL (ref 3.5–5.0)
ALK PHOS: 62 U/L (ref 38–126)
ALT: 20 U/L (ref 17–63)
AST: 32 U/L (ref 15–41)
Anion gap: 10 (ref 5–15)
BILIRUBIN TOTAL: 0.6 mg/dL (ref 0.3–1.2)
BUN: 16 mg/dL (ref 6–20)
CO2: 25 mmol/L (ref 22–32)
Calcium: 8.8 mg/dL — ABNORMAL LOW (ref 8.9–10.3)
Chloride: 94 mmol/L — ABNORMAL LOW (ref 101–111)
Creatinine, Ser: 0.6 mg/dL — ABNORMAL LOW (ref 0.61–1.24)
GFR calc Af Amer: >60 mL/min (ref >60)
GFR calc non Af Amer: >60 mL/min (ref >60)
Glucose, Bld: 174 mg/dL — ABNORMAL HIGH (ref 65–99)
POTASSIUM: 3.9 mmol/L (ref 3.5–5.1)
Sodium: 129 mmol/L — ABNORMAL LOW (ref 135–145)
TOTAL PROTEIN: 7.1 g/dL (ref 6.5–8.1)

## 2016-07-08 LAB — CBC WITH DIFFERENTIAL/PLATELET
Basophils Absolute: 0 10*3/uL (ref 0–0.1)
Basophils Relative: 0 %
EOS ABS: 0 10*3/uL (ref 0–0.7)
Eosinophils Relative: 0 %
HEMATOCRIT: 26.2 % — AB (ref 40.0–52.0)
HEMOGLOBIN: 8.9 g/dL — AB (ref 13.0–18.0)
LYMPHS ABS: 0.2 10*3/uL — AB (ref 1.0–3.6)
Lymphocytes Relative: 3 %
MCH: 28.2 pg (ref 26.0–34.0)
MCHC: 34.2 g/dL (ref 32.0–36.0)
MCV: 82.5 fL (ref 80.0–100.0)
Monocytes Absolute: 0.4 10*3/uL (ref 0.2–1.0)
Monocytes Relative: 5 %
NEUTROS PCT: 92 %
Neutro Abs: 7.4 10*3/uL — ABNORMAL HIGH (ref 1.4–6.5)
Platelets: 474 10*3/uL — ABNORMAL HIGH (ref 150–440)
RBC: 3.17 MIL/uL — ABNORMAL LOW (ref 4.40–5.90)
RDW: 21.2 % — ABNORMAL HIGH (ref 11.5–14.5)
WBC: 8.1 10*3/uL (ref 3.8–10.6)

## 2016-07-08 LAB — LIPASE, BLOOD: Lipase: 25 U/L (ref 11–51)

## 2016-07-08 LAB — TROPONIN I: Troponin I: <0.03 ng/mL (ref <0.03)

## 2016-07-08 MED ORDER — HEPARIN SOD (PORK) LOCK FLUSH 100 UNIT/ML IV SOLN
INTRAVENOUS | Status: AC
  Administered 2016-07-08: 500 [IU] via INTRAVENOUS
  Filled 2016-07-08: qty 5

## 2016-07-08 MED ORDER — SODIUM CHLORIDE 0.9 % IV BOLUS (SEPSIS)
250.0000 mL | Freq: Once | INTRAVENOUS | Status: AC
  Administered 2016-07-08: 250 mL via INTRAVENOUS

## 2016-07-08 MED ORDER — PENTAFLUOROPROP-TETRAFLUOROETH EX AERO
INHALATION_SPRAY | CUTANEOUS | Status: AC
  Filled 2016-07-08: qty 30

## 2016-07-08 MED ORDER — IOPAMIDOL (ISOVUE-300) INJECTION 61%
30.0000 mL | Freq: Once | INTRAVENOUS | Status: AC | PRN
  Administered 2016-07-08: 30 mL via ORAL

## 2016-07-08 MED ORDER — SODIUM CHLORIDE 0.9 % IV BOLUS (SEPSIS): 500.0000 mL | Freq: Once | INTRAVENOUS | Status: DC

## 2016-07-08 MED ORDER — SUCRALFATE 1 G PO TABS: 1.0000 g | ORAL_TABLET | Freq: Four times a day (QID) | ORAL | 1 refills | Status: AC

## 2016-07-08 MED ORDER — MORPHINE SULFATE (PF) 4 MG/ML IV SOLN
4.0000 mg | Freq: Once | INTRAVENOUS | Status: DC
  Filled 2016-07-08: qty 1

## 2016-07-08 MED ORDER — SUCRALFATE 1 G PO TABS
1.0000 g | ORAL_TABLET | Freq: Once | ORAL | Status: DC
  Filled 2016-07-08: qty 1

## 2016-07-08 MED ORDER — IOPAMIDOL (ISOVUE-300) INJECTION 61%
100.0000 mL | Freq: Once | INTRAVENOUS | Status: AC | PRN
  Administered 2016-07-08: 100 mL via INTRAVENOUS

## 2016-07-08 MED ORDER — HEPARIN SOD (PORK) LOCK FLUSH 100 UNIT/ML IV SOLN
500.0000 [IU] | Freq: Once | INTRAVENOUS | Status: AC
  Administered 2016-07-08: 500 [IU] via INTRAVENOUS

## 2016-07-08 NOTE — Progress Notes (Signed)
ALCARIO, TINKEY ([id number]) Visit Report for 07/06/2016 Chief Complaint Document Details Patient Name: [name], [name]. Date of Service: 07/06/2016 1:30 PM Medical Record Number: [id number] Patient Account Number: [id number] Date of Birth/Sex: 1940-06-13 (76 y.o. Male) Treating RN: Ahmed Prima Primary Care Provider: Enid Derry Other Clinician: Referring Provider: Enid Derry Treating Provider/Extender: Frann Rider in Treatment: 16 Information Obtained from: Patient Chief Complaint Patient is at the clinic for treatment of an open pressure ulcer to the right lateral ankle which she's had for about 2 months Electronic Signature(s) Signed: 07/06/2016 1:59:31 PM By: Christin Fudge MD, FACS Entered By: Christin Fudge on 07/06/2016 13:59:31 [name] ([id number]) -------------------------------------------------------------------------------- Debridement Details Patient Name: [name] Date of Service: 07/06/2016 1:30 PM Medical Record Number: [id number] Patient Account Number: [id number] Date of Birth/Sex: 1940-03-09 (76 y.o. Male) Treating RN: Ahmed Prima Primary Care Provider: Enid Derry Other Clinician: Referring Provider: Enid Derry Treating Provider/Extender: Frann Rider in Treatment: 16 Debridement Performed for Wound #3 Right,Plantar Foot Assessment: Performed By: Physician Christin Fudge, MD Debridement: Debridement Severity of Tissue Pre Fat layer exposed Debridement: Pre-procedure Verification/Time Out Yes - 13:49 Taken: Start Time: 13:50 Pain Control: Lidocaine 4% Topical Solution Level: Skin/Subcutaneous Tissue Total Area Debrided (L x 1.1 (cm) x 0.8 (cm) = 0.88 (cm) W): Tissue and other Viable, Non-Viable, Exudate, Fibrin/Slough, Subcutaneous material debrided: Instrument: Curette Bleeding: Minimum Hemostasis Achieved: Pressure End Time: 13:54 Procedural Pain: 0 Post Procedural Pain: 0 Response to Treatment:  Procedure was tolerated well Post Debridement Measurements of Total Wound Length: (cm) 1.1 Width: (cm) 0.2 Depth: (cm) 0.1 Volume: (cm) 0.017 Character of Wound/Ulcer Post Requires Further Debridement Debridement: Severity of Tissue Post Debridement: Fat layer exposed Post Procedure Diagnosis Same as Pre-procedure Electronic Signature(s) Signed: 07/06/2016 1:59:22 PM By: Christin Fudge MD, FACS Signed: 07/06/2016 4:08:27 PM By: Delora Fuel ([id number]) Entered By: Christin Fudge on 07/06/2016 13:59:22 [name] ([id number]) -------------------------------------------------------------------------------- HPI Details Patient Name: [name] Date of Service: 07/06/2016 1:30 PM Medical Record Number: [id number] Patient Account Number: [id number] Date of Birth/Sex: [birth year] (76 y.o. Male) Treating RN: Ahmed Prima Primary Care Provider: Enid Derry Other Clinician: Referring Provider: Enid Derry Treating Provider/Extender: Frann Rider in Treatment: 16 History of Present Illness Location: Patient presents with an ulcer on the right lateral ankle. Quality: Patient reports experiencing a dull pain to affected area(s). Severity: Patient states wound are getting worse. Duration: Patient has had the wound for > 2 months prior to seeking treatment at the wound center Timing: Pain in wound is Intermittent (comes and goes Context: The wound appeared gradually over time Modifying Factors: Other treatment(s) tried include:local care as per the medical oncologist Associated Signs and Symptoms: Patient reports having:some pain and no discharge HPI Description: 76 year old patient has been referred by his medical oncologist Dr. Grayland Ormond, for a right lateral malleolus ulcer has been there for several weeks. 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 He is currently on chemotherapy with FOLFOX  and Herceptin. past medical history significant for diabetes mellitus, anemia, hypertension, sleep apnea, status post appendectomy, peripheral vascular catheterization( Port placement) in September 2017 by Dr. Delana Meyer. He is also receiving palliative radiation therapy and was seen by Dr. Donella Stade. 03/17/2016 -- - x-ray of the right ankle -- IMPRESSION: 1. Soft tissue wound noted over the lateral malleolus, no underlying bony abnormality. No acute bony abnormality. 2. Diffuse degenerative change. 3. Peripheral vascular disease. 05/05/2016 -- the patient's  wife tells me that she has noticed a small superficial bruise on the left lateral ankle and wanted me to view this area. 05/12/2016 -- the patient is unable to afford Santyl ointment and we have been struggling over the last 8 weeks to use various alternatives. I have asked them today if they would be willing to use the snap vacuum system and she will let me know soon. 05/19/2016 -- the patient and the wife have declined the use of the snap the vacuum system. He does not want any debridement to be done today. His proteins have improved a bit. 06/08/2016 -- . Not seen the patient back for 3 weeks and in the meanwhile the wife has decided to use duoderm on the wound,-- given to her by a friend. The wound is macerated and has increased in size due to the moisture. 06/16/2016 -- besides the original wound he had on his right lateral malleolus he has a small area on the dorsum of his right foot which is possibly cause due to pressure. He now has a plantar wound on the first metatarsal head on the right foot where his podiatrist Dr. Vickki Jensen had debrided a callus yesterday. Electronic Signature(s) Signed: 07/06/2016 1:59:37 PM By: Christin Fudge MD, FACS Entered By: Christin Fudge on 07/06/2016 13:59:37 [name], [name] ([id number], [place] ([id number]) -------------------------------------------------------------------------------- Physical  Exam Details Patient Name: [name], [name] Date of Service: 07/06/2016 1:30 PM Medical Record Number: [id number] Patient Account Number: [id number] Date of Birth/Sex: 08-14-1940 (76 y.o. Male) Treating RN: Ahmed Prima Primary Care Provider: Enid Derry Other Clinician: Referring Provider: Enid Derry Treating Provider/Extender: Frann Rider in Treatment: 16 Constitutional . Pulse regular. Respirations normal and unlabored. Afebrile. . Eyes Nonicteric. Reactive to light. Ears, Nose, Mouth, and Throat Lips, teeth, and gums WNL.Marland Kitchen Moist mucosa without lesions. Neck supple and nontender. No palpable supraclavicular or cervical adenopathy. Normal sized without goiter. Respiratory WNL. No retractions.. Cardiovascular Pedal Pulses WNL. No clubbing, cyanosis or edema. Lymphatic No adneopathy. No adenopathy. No adenopathy. Musculoskeletal Adexa without tenderness or enlargement.. Digits and nails w/o clubbing, cyanosis, infection, petechiae, ischemia, or inflammatory conditions.. Integumentary (Hair, Skin) No suspicious lesions. No crepitus or fluctuance. No peri-wound warmth or erythema. No masses.Marland Kitchen Psychiatric Judgement and insight Intact.. No evidence of depression, anxiety, or agitation.. Notes the dorsum of the right foot has healed. The right lateral ankle still has some debris was too tender to debride today. The right plantar foot wound has been macerated and needed some sharp debridement along with the subcutaneous tissue and brisk bleeding was controlled with pressure Electronic Signature(s) Signed: 07/06/2016 2:00:21 PM By: Christin Fudge MD, FACS Entered By: Christin Fudge on 07/06/2016 14:00:21 [name] ([id number]) -------------------------------------------------------------------------------- Physician Orders Details Patient Name: [name] Date of Service: 07/06/2016 1:30 PM Medical Record Number: [id number] Patient Account Number:  [id number] Date of Birth/Sex: [birth year] (76 y.o. Male) Treating RN: Ahmed Prima Primary Care Provider: Enid Derry Other Clinician: Referring Provider: Enid Derry Treating Provider/Extender: Frann Rider in Treatment: 76 Verbal / Phone Orders: Yes Clinician: Carolyne Fiscal, Debi Read Back and Verified: Yes Diagnosis Coding Wound Cleansing Wound #1 Right,Lateral Malleolus o Clean wound with Normal Saline. o Cleanse wound with mild soap and water Wound #3 Right,Plantar Foot o Clean wound with Normal Saline. o Cleanse wound with mild soap and water Anesthetic Wound #1 Right,Lateral Malleolus o Topical Lidocaine 4% cream applied to wound bed prior to debridement Wound #3 Right,Plantar Foot o Topical Lidocaine 4% cream applied to wound  bed prior to debridement Skin Barriers/Peri-Wound Care Wound #1 Right,Lateral Malleolus o Antifungal cream - LOTRISONSE.Marland Kitchenapply around wound on reddened areas as needed Primary Wound Dressing Wound #1 Right,Lateral Malleolus o Hydrogel - similar to KY jelly over the counter o Cutimed Sorbact - cut small piece and insert in wound Wound #3 Right,Plantar Foot o Aquacel Ag Secondary Dressing Wound #1 Right,Lateral Malleolus o Dry Gauze o Boardered Foam Dressing Wound #3 Right,Plantar Foot o Dry Gauze o Boardered Foam Dressing [name], [name] ([id number]) o Drawtex Dressing Change Frequency Wound #1 Right,Lateral Malleolus o Change dressing every day. Follow-up Appointments Wound #1 Right,Lateral Malleolus o Return Appointment in 1 week. Edema Control Wound #1 Right,Lateral Malleolus o Elevate legs to the level of the heart and pump ankles as often as possible Off-Loading Wound #1 Right,Lateral Malleolus o Other: - Keep pressure off the malleolus Wound #3 Right,Plantar Foot o Other: - Keep pressure off the plantar foot Additional Orders / Instructions Wound #1 Right,Lateral Malleolus o  Increase protein intake. o Activity as tolerated Medications-please add to medication list. Wound #1 Right,Lateral Malleolus o Other: - Include these in your diet...VITAMIN A, C, ZINC, MVI Electronic Signature(s) Signed: 07/06/2016 4:20:02 PM By: Christin Fudge MD, FACS Entered By: Christin Fudge on 07/06/2016 14:00:40 [name] ([id number]) -------------------------------------------------------------------------------- Problem List Details Patient Name: [name] Date of Service: 07/06/2016 1:30 PM Medical Record Number: [id number] Patient Account Number: [id number] Date of Birth/Sex: 1940-09-02 (76 y.o. Male) Treating RN: Ahmed Prima Primary Care Provider: Enid Derry Other Clinician: Referring Provider: Enid Derry Treating Provider/Extender: Frann Rider in Treatment: 16 Active Problems ICD-10 Encounter Code Description Active Date Diagnosis E11.621 Type 2 diabetes mellitus with foot ulcer 05/05/2016 Yes L89.512 Pressure ulcer of right ankle, stage 2 05/05/2016 Yes L89.521 Pressure ulcer of left ankle, stage 1 05/05/2016 Yes 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 05/05/2016 Yes Inactive Problems Resolved Problems Electronic Signature(s) Signed: 07/06/2016 1:58:57 PM By: Christin Fudge MD, FACS Entered By: Christin Fudge on 07/06/2016 13:58:57 [name] ([id number]) -------------------------------------------------------------------------------- Progress Note Details Patient Name: [name] Date of Service: 07/06/2016 1:30 PM Medical Record Number: [id number] Patient Account Number: [id number] Date of Birth/Sex: [birth year] (76 y.o. Male) Treating RN: Ahmed Prima Primary Care Provider: Enid Derry Other Clinician: Referring Provider: Enid Derry Treating Provider/Extender: Frann Rider in Treatment: 16 Subjective Chief Complaint Information obtained from Patient Patient is at the clinic for treatment of an open  pressure ulcer to the right lateral ankle which she's had for about 2 months History of Present Illness (HPI) The following HPI elements were documented for the patient's wound: Location: Patient presents with an ulcer on the right lateral ankle. Quality: Patient reports experiencing a dull pain to affected area(s). Severity: Patient states wound are getting worse. Duration: Patient has had the wound for > 2 months prior to seeking treatment at the wound center Timing: Pain in wound is Intermittent (comes and goes Context: The wound appeared gradually over time Modifying Factors: Other treatment(s) tried include:local care as per the medical oncologist Associated Signs and Symptoms: Patient reports having:some pain and no discharge 76 year old patient has been referred by his medical oncologist Dr. Grayland Ormond, for a right lateral malleolus ulcer has been there for several weeks. 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 He is currently on chemotherapy with FOLFOX and Herceptin. past medical history significant for diabetes mellitus, anemia, hypertension, sleep apnea, status post appendectomy, peripheral vascular catheterization( Port placement) in September 2017  by Dr. Delana Meyer. He is also receiving palliative radiation therapy and was seen by Dr. Donella Stade. 03/17/2016 -- - x-ray of the right ankle -- IMPRESSION: 1. Soft tissue wound noted over the lateral malleolus, no underlying bony abnormality. No acute bony abnormality. 2. Diffuse degenerative change. 3. Peripheral vascular disease. 05/05/2016 -- the patient's wife tells me that she has noticed a small superficial bruise on the left lateral ankle and wanted me to view this area. 05/12/2016 -- the patient is unable to afford Santyl ointment and we have been struggling over the last 8 weeks to use various alternatives. I have asked them today if they would be willing to use the snap  vacuum system and she will let me know soon. 05/19/2016 -- the patient and the wife have declined the use of the snap the vacuum system. He does not want any debridement to be done today. His proteins have improved a bit. 06/08/2016 -- . Not seen the patient back for 3 weeks and in the meanwhile the wife has decided to use duoderm on the wound,-- given to her by a friend. The wound is macerated and has increased in size due to the moisture. [name], [name] ([id number]) 06/16/2016 -- besides the original wound he had on his right lateral malleolus he has a small area on the dorsum of his right foot which is possibly cause due to pressure. He now has a plantar wound on the first metatarsal head on the right foot where his podiatrist Dr. Vickki Jensen had debrided a callus yesterday. Objective Constitutional Pulse regular. Respirations normal and unlabored. Afebrile. Vitals Time Taken: 1:35 PM, Height: 67 in, Weight: 136 lbs, BMI: 21.3, Temperature: 97.8 F, Pulse: 73 bpm, Respiratory Rate: 16 breaths/min, Blood Pressure: 150/59 mmHg. Eyes Nonicteric. Reactive to light. Ears, Nose, Mouth, and Throat Lips, teeth, and gums WNL.Marland Kitchen Moist mucosa without lesions. Neck supple and nontender. No palpable supraclavicular or cervical adenopathy. Normal sized without goiter. Respiratory WNL. No retractions.. Cardiovascular Pedal Pulses WNL. No clubbing, cyanosis or edema. Lymphatic No adneopathy. No adenopathy. No adenopathy. Musculoskeletal Adexa without tenderness or enlargement.. Digits and nails w/o clubbing, cyanosis, infection, petechiae, ischemia, or inflammatory conditions.Marland Kitchen Psychiatric Judgement and insight Intact.. No evidence of depression, anxiety, or agitation.. General Notes: the dorsum of the right foot has healed. The right lateral ankle still has some debris was too tender to debride today. The right plantar foot wound has been macerated and needed some sharp debridement along with the  subcutaneous tissue and brisk bleeding was controlled with pressure Integumentary (Hair, Skin) No suspicious lesions. No crepitus or fluctuance. No peri-wound warmth or erythema. No masses.[name], [name] ([id number]) Wound #1 status is Open. Original cause of wound was Gradually Appeared. The wound is located on the Right,Lateral Malleolus. The wound measures 0.8cm length x 0.7cm width x 0.2cm depth; 0.44cm^2 area and 0.088cm^3 volume. There is Fat Layer (Subcutaneous Tissue) Exposed exposed. There is no tunneling or undermining noted. There is a medium amount of serous drainage noted. The wound margin is flat and intact. There is small (1-33%) pink, pale granulation within the wound bed. There is a large (67-100%) amount of necrotic tissue within the wound bed including Adherent Slough. The periwound skin appearance exhibited: Induration, Erythema. The periwound skin appearance did not exhibit: Dry/Scaly, Maceration. The surrounding wound skin color is noted with erythema which is circumferential. Periwound temperature was noted as No Abnormality. The periwound has tenderness on palpation. Wound #2 status is Open. Original cause of wound was Gradually  Appeared. The wound is located on the Right,Dorsal Foot. The wound measures 0cm length x 0cm width x 0cm depth; 0cm^2 area and 0cm^3 volume. There is Fat Layer (Subcutaneous Tissue) Exposed exposed. There is no tunneling or undermining noted. There is a none present amount of drainage noted. The wound margin is flat and intact. There is no granulation within the wound bed. There is no necrotic tissue within the wound bed. The periwound skin appearance did not exhibit: Callus, Crepitus, Excoriation, Induration, Rash, Scarring, Dry/Scaly, Maceration, Atrophie Blanche, Cyanosis, Ecchymosis, Hemosiderin Staining, Mottled, Pallor, Rubor, Erythema. Periwound temperature was noted as No Abnormality. The periwound has tenderness on palpation. Wound #3  status is Open. Original cause of wound was Pressure Injury. The wound is located on the [place]. The wound measures 1.1cm length x 0.8cm width x 0.1cm depth; 0.691cm^2 area and 0.069cm^3 volume. There is Fat Layer (Subcutaneous Tissue) Exposed exposed. There is no tunneling or undermining noted. There is a large amount of serous drainage noted. The wound margin is distinct with the outline attached to the wound base. There is medium (34-66%) pink, pale granulation within the wound bed. There is a medium (34-66%) amount of necrotic tissue within the wound bed including Adherent Slough. The periwound skin appearance exhibited: Callus, Maceration. The periwound skin appearance did not exhibit: Crepitus, Excoriation, Induration, Rash, Scarring, Dry/Scaly, Atrophie Blanche, Cyanosis, Ecchymosis, Hemosiderin Staining, Mottled, Pallor, Rubor, Erythema. Periwound temperature was noted as No Abnormality. The periwound has tenderness on palpation. Assessment Active Problems ICD-10 E11.621 - Type 2 diabetes mellitus with foot ulcer L89.512 - Pressure ulcer of right ankle, stage 2 L89.521 - Pressure ulcer of left ankle, stage 1 E44.1 - Mild protein-calorie malnutrition Procedures [name], [name] ([id number]) Wound #3 Pre-procedure diagnosis of Wound #3 is a Diabetic Wound/Ulcer of the Lower Extremity located on the [place] .Severity of Tissue Pre Debridement is: Fat layer exposed. There was a Skin/Subcutaneous Tissue Debridement ([id number]) debridement with total area of 0.88 sq cm performed by Christin Fudge, MD. with the following instrument(s): Curette to remove Viable and Non-Viable tissue/material including Exudate, Fibrin/Slough, and Subcutaneous after achieving pain control using Lidocaine 4% Topical Solution. A time out was conducted at 13:49, prior to the start of the procedure. A Minimum amount of bleeding was controlled with Pressure. The procedure was tolerated well  with a pain level of 0 throughout and a pain level of 0 following the procedure. Post Debridement Measurements: 1.1cm length x 0.2cm width x 0.1cm depth; 0.017cm^3 volume. Character of Wound/Ulcer Post Debridement requires further debridement. Severity of Tissue Post Debridement is: Fat layer exposed. Post procedure Diagnosis Wound #3: Same as Pre-Procedure Plan Wound Cleansing: Wound #1 Right,Lateral Malleolus: Clean wound with Normal Saline. Cleanse wound with mild soap and water Wound #3 Right,Plantar Foot: Clean wound with Normal Saline. Cleanse wound with mild soap and water Anesthetic: Wound #1 Right,Lateral Malleolus: Topical Lidocaine 4% cream applied to wound bed prior to debridement Wound #3 Right,Plantar Foot: Topical Lidocaine 4% cream applied to wound bed prior to debridement Skin Barriers/Peri-Wound Care: Wound #1 Right,Lateral Malleolus: Antifungal cream - LOTRISONSE.Marland Kitchenapply around wound on reddened areas as needed Primary Wound Dressing: Wound #1 Right,Lateral Malleolus: Hydrogel - similar to [place] jelly over the counter Cutimed Sorbact - cut small piece and insert in wound Wound #3 Right,Plantar Foot: Aquacel Ag Secondary Dressing: Wound #1 Right,Lateral Malleolus: Dry Gauze Boardered Foam Dressing Wound #3 Right,Plantar Foot: Dry Gauze Boardered Foam Dressing JAYMARION, TROMBLY ([id number]) Drawtex Dressing Change Frequency: Wound #1 Right,Lateral  Malleolus: Change dressing every day. Follow-up Appointments: Wound #1 Right,Lateral Malleolus: Return Appointment in 1 week. Edema Control: Wound #1 Right,Lateral Malleolus: Elevate legs to the level of the heart and pump ankles as often as possible Off-Loading: Wound #1 Right,Lateral Malleolus: Other: - Keep pressure off the malleolus Wound #3 Right,Plantar Foot: Other: - Keep pressure off the plantar foot Additional Orders / Instructions: Wound #1 Right,Lateral Malleolus: Increase protein intake. Activity  as tolerated Medications-please add to medication list.: Wound #1 Right,Lateral Malleolus: Other: - Include these in your diet...VITAMIN A, C, ZINC, MVI The patient has been approved for a skin substitute but his lateral ankle wound is not yet ready. Sorbact with hydrogel will be applied to his right ankle and silver alginate with an offloading felt to the plantar aspect of his right foot. The patient is battling with metastatic esophageal cancer and he is undergoing chemotherapy at the present time. He will come back and see as next week Electronic Signature(s) Signed: 07/06/2016 2:02:04 PM By: Christin Fudge MD, FACS Entered By: Christin Fudge on 07/06/2016 14:02:04 [name] ([id number]) -------------------------------------------------------------------------------- SuperBill Details Patient Name: [name] Date of Service: 07/06/2016 Medical Record Number: [id number] Patient Account Number: [id number] Date of Birth/Sex: 1940/08/11 (76 y.o. Male) Treating RN: Ahmed Prima Primary Care Provider: Enid Derry Other Clinician: Referring Provider: Enid Derry Treating Provider/Extender: Frann Rider in Treatment: 16 Diagnosis Coding ICD-10 Codes Code Description E11.621 Type 2 diabetes mellitus with foot ulcer L89.512 Pressure ulcer of right ankle, stage 2 L89.521 Pressure ulcer of left ankle, stage 1 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 Facility Procedures CPT4 Code: [id number] Description: 62836 - DEB SUBQ TISSUE 20 SQ CM/< ICD-10 Description Diagnosis E11.621 Type 2 diabetes mellitus with foot ulcer L89.512 Pressure ulcer of right ankle, stage 2 L89.521 Pressure ulcer of left ankle, stage 1 E44.1 Mild protein-calorie  malnutrition Modifier: Quantity: 1 Physician Procedures CPT4 Code: [id number] Description: 46503 - WC PHYS SUBQ TISS 20 SQ CM ICD-10 Description Diagnosis E11.621 Type 2 diabetes mellitus with foot ulcer L89.512 Pressure ulcer of right  ankle, stage 2 L89.521 Pressure ulcer of left ankle, stage 1 E44.1 Mild protein-calorie  malnutrition Modifier: Quantity: 1 Electronic Signature(s) Signed: 07/06/2016 2:02:18 PM By: Christin Fudge MD, FACS Entered By: Christin Fudge on 07/06/2016 14:02:17

## 2016-07-08 NOTE — ED Notes (Signed)
Patient transported to CT 

## 2016-07-08 NOTE — Discharge Instructions (Signed)
Return to the emergency room for any new or worrisome symptoms including bleeding, increased pain, vomiting, shortness of breath, chest pain, fever, or any other concerns. Try the Carafate 3 or 4 times a day to see if it helps with his pain. Talk to your oncologist first thing next week. Also follow closely with primary care doctor. It is okay for him to have a little extra salt for the next few days.

## 2016-07-08 NOTE — ED Triage Notes (Signed)
Pt to ed with c/o upper abd pain that started 2 days ago after recieveing chemo for esophageal cancer.

## 2016-07-08 NOTE — ED Notes (Signed)
FIRST NURSE NOTE:   Pt is CA patient on chemo, last treatment was 5/23. States started having abdominal pain that is worse this morning. Pt placed in wheelchair upon arrival and mask applied.

## 2016-07-08 NOTE — ED Provider Notes (Addendum)
[facility] Emergency Department Provider Note  ____________________________________________   I have reviewed the triage vital signs and the nursing notes.   HISTORY  Chief Complaint Abdominal Pain    HPI [name] is a 76 y.o. male who has a history of anemia,arthritis joint disease, esophageal cancer on chemotherapy who had chemotherapy 2 days ago since that time as had epigastric abdominal pain. He denies any fever chills nausea or vomiting, the pain is persistent since like it increased by mouth. Denies any constipation or diarrhea. Has had normal bowel movements has not had this pain before this is a new chemotherapeutic agent he is using. She's wife is at bedside, she is very active in his care. During her last emergency room visit for example she and he refused cardiac stress test for the patient despite having it advised to them. In any event, patient is not having chest pain or shortness of breath. The pain as a bandlike sharp pain around his abdomen. Gradual in onset. Taken nothing to make it better and nothing seems to make it worse although food might.   Past Medical History:  Diagnosis Date  . Anemia   . Arthritis   . 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 Diabetes mellitus without complication ([place])   . 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 Esophageal cancer ([place])   . GI bleed   . Hearing loss    left ear  . Hypertension   . Hypothyroidism   . Liver mass   . Mild left atrial enlargement 08/12/2015   Very mild; LA 4.2 cm; echo March 2017  . Mitral valvular regurgitation 08/12/2015  . Sleep apnea   . Stroke ([place])   . TIA (transient ischemic attack)   . 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 Ulcer of left foot ([place]) 06/08/2016  . Encounter for antineoplastic immunotherapy 04/11/2016  . Encounter for antineoplastic chemotherapy 04/11/2016   . Aortic regurgitation 10/19/2015  . Arthritis 10/19/2015  . Hyperlipidemia, unspecified 10/19/2015  . Memory loss 10/19/2015  . RBBB 10/19/2015  . Stroke ([place]) 10/19/2015  . Malignant tumor of lower third of esophagus ([place]) 10/18/2015  . Esophageal mass 10/12/2015  . Liver metastases ([place]) 09/28/2015  . Iron deficiency anemia 09/28/2015  . Esophageal thickening 09/26/2015  . Pancreatic lesion 09/26/2015  . Abnormal EKG 09/19/2015  . Chest pain at rest 09/19/2015  . Sebaceous cyst 09/19/2015  . Liver mass, right lobe 09/10/2015  . Abnormal weight loss 09/10/2015  . Nausea 08/27/2015  . Thyroid disease 08/27/2015  . Sleep apnea 08/15/2015  . Ectatic abdominal aorta ([place]) 08/12/2015  . Mild left atrial enlargement 08/12/2015  . Mitral regurgitation 08/12/2015  . Prostate cancer screening 08/12/2015  . Cough 08/05/2015  . 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 Diabetes mellitus type 2, uncomplicated ([place]) 34/74/2595  . Foreign body of left eye 06/23/2015  . Non-ST elevation myocardial infarction (NSTEMI) ([place]) 10/28/2014  . Severe anemia 10/03/2014  . GI bleed 10/03/2014  . DM (diabetes mellitus) ([place]) 10/03/2014  . Hypertension 10/03/2014  . Altered mental status   . Anemia requiring transfusions     Past Surgical History:  Procedure Laterality Date  . APPENDECTOMY    . EUS N/A 10/07/2015   Procedure: FULL UPPER ENDOSCOPIC ULTRASOUND (EUS) RADIAL;  Surgeon: Holly Bodily, MD;  Location: [facility]  ENDOSCOPY;  Service: Endoscopy;  Laterality: N/A;  . EYE SURGERY    . FRACTURE SURGERY    . NOSE SURGERY    . PERIPHERAL VASCULAR CATHETERIZATION N/A 10/26/2015   Procedure: Glori Luis Cath Insertion;  Surgeon: Katha Cabal, MD;  Location: [facility] CV LAB;  Service: Cardiovascular;  Laterality: N/A;    Prior to Admission medications   Medication Sig Start Date End Date Taking? Authorizing Provider  aspirin EC 81 MG  tablet Take 81 mg by mouth 2 (two) times daily.     [provider]  BIOTIN PO Take by mouth.    [provider]  Cholecalciferol (VITAMIN D3) 1000 UNITS CAPS Take 2,000 Units by mouth daily.     [provider]  Coenzyme Q10 (CO Q-10) 200 MG CAPS Take 200 mg by mouth daily.     [provider]  glucose blood (FREESTYLE LITE) test strip Use 2 (two) times daily. Free style lite test strips 03/30/16   [provider]  isosorbide mononitrate (IMDUR) 60 MG 24 hr tablet Take 1 tablet (60 mg total) by mouth daily. 07/04/16   Wellington Hampshire, MD  levothyroxine (SYNTHROID, LEVOTHROID) 100 MCG tablet Take 100 mcg by mouth every morning. 07/29/14   [provider]  lidocaine-prilocaine (EMLA) cream Apply 1 application topically as needed. Apply to port 1-2 hours prior to chemotherapy. Cover with plastic wrap. 10/19/15   Lloyd Huger, MD  loperamide (IMODIUM A-D) 2 MG tablet Take 2 at diarrhea onset, then 1 every 2hr until 12hrs with no BM. May take 2 every 4hrs at night. If diarrhea recurs repeat. 07/03/16   Lloyd Huger, MD  Magnesium Oxide 250 MG TABS Take 1 tablet (250 mg total) by mouth daily. 08/12/15   Arnetha Courser, MD  metFORMIN (GLUCOPHAGE) 1000 MG tablet Take 1,000 mg by mouth daily.     [provider]  metoprolol tartrate (LOPRESSOR) 25 MG tablet Take 12.5 mg by mouth 2 (two) times daily.  07/24/11   [provider]  Misc Natural Products (BEE PROPOLIS PO) Take 1 tablet by mouth 2 (two) times daily.     [provider]  Multiple Vitamins-Minerals (MULTI FOR HIM 50+ PO) Take 1 tablet by mouth daily.    [provider]  prochlorperazine (COMPAZINE) 10 MG tablet Take 1 tablet (10 mg total) by mouth every 6 (six) hours as needed for nausea or vomiting. 10/19/15   Lloyd Huger, MD  Red Yeast Rice 600 MG TABS Take 2 tablets by mouth at bedtime. 12/30/09   [provider]  sucralfate  (CARAFATE) 1 g tablet Take 1 tablet (1 g total) by mouth 3 (three) times daily. Dissolve each tablet in 2-3 tbsp warm water, swish and swallow. 11/16/15   Noreene Filbert, MD  Wound Dressings (Startex WOUND/BURN DRESSING) PSTE  03/13/16   [provider]    Allergies Sulfa antibiotics; Amoxicillin; Ampicillin; Keflex [cephalexin]; Lisinopril; Losartan; Sulfamethoxazole-trimethoprim; and Cephalosporins  Family History  Problem Relation Age of Onset  . Hypertension Mother   . Lung cancer Father   . Hypertension Brother     Social History Social History  Substance Use Topics  . Smoking status: Former Smoker    Packs/day: 1.00    Types: Cigarettes    Quit date: 10/03/1983  . Smokeless tobacco: Never Used  . Alcohol use 0.0 oz/week     Comment: rarely    Review of Systems Constitutional: No fever/chills Eyes: No visual changes. ENT: No  sore throat. No stiff neck no neck pain Cardiovascular: Denies chest pain. Respiratory: Denies shortness of breath. Gastrointestinal:   no vomiting.  No diarrhea.  No constipation. Genitourinary: Negative for dysuria. Musculoskeletal: Negative lower extremity swelling Skin: Negative for rash. Neurological: Negative for severe headaches, focal weakness or numbness. 10-point ROS otherwise negative.  ____________________________________________   PHYSICAL EXAM:  VITAL SIGNS: ED Triage Vitals  Enc Vitals Group     BP 07/08/16 1200 130/61     Pulse Rate 07/08/16 1200 80     Resp 07/08/16 1200 18     Temp 07/08/16 1200 98 F (36.7 C)     Temp Source 07/08/16 1200 Oral     SpO2 07/08/16 1200 100 %     Weight 07/08/16 1201 133 lb (60.3 kg)     Height 07/08/16 1201 5\' 7"  (1.702 m)     Head Circumference --      Peak Flow --      Pain Score 07/08/16 1200 6     Pain Loc --      Pain Edu? --      Excl. in [place]? --     Constitutional: Alert and oriented. Well appearing and in no acute distress. Eyes: Conjunctivae are normal. PERRL.  EOMI. Head: Atraumatic. Nose: No congestion/rhinnorhea. Mouth/Throat: Mucous membranes are moist.  Oropharynx non-erythematous. Neck: No stridor.   Nontender with no meningismus Cardiovascular: Normal rate, regular rhythm. Grossly normal heart sounds.  Good peripheral circulation. Respiratory: Normal respiratory effort.  No retractions. Lungs CTAB. Abdominal: Soft and Positive epigastric tenderness which reproduces his pain. No distention. No guarding no rebound Back:  There is no focal tenderness or step off.  there is no midline tenderness there are no lesions noted. there is no CVA tenderness Musculoskeletal: No lower extremity tenderness, no upper extremity tenderness. No joint effusions, no DVT signs strong distal pulses no edema Neurologic:  Normal speech and language. No gross focal neurologic deficits are appreciated.  Skin:  Skin is warm, dry and intact. No rash noted. Psychiatric: Mood and affect are normal. Speech and behavior are normal.  ____________________________________________   LABS (all labs ordered are listed, but only abnormal results are displayed)  Labs Reviewed  COMPREHENSIVE METABOLIC PANEL  TROPONIN I  LIPASE, BLOOD  URINALYSIS, COMPLETE (UACMP) WITH MICROSCOPIC  CBC WITH DIFFERENTIAL/PLATELET   ____________________________________________  EKG  I personally interpreted any EKGs ordered by me or triage Sinus rhythm, right bundle-branch block, left anterior fascicular block, nonspecific ST changes, ____________________________________________  RADIOLOGY  I reviewed any imaging ordered by me or triage that were performed during my shift and, if possible, patient and/or family made aware of any abnormal findings. ____________________________________________   PROCEDURES  Procedure(s) performed: None  Procedures  Critical Care performed: None  ____________________________________________   INITIAL IMPRESSION / ASSESSMENT AND PLAN / ED  COURSE  Pertinent labs & imaging results that were available during my care of the patient were reviewed by me and considered in my medical decision making (see chart for details).  Patient here with epigastric abdominal pain the context of esophageal cancer chemotherapy. His exam is reassuring that he does have epigastric tenderness. I did suggest IV fluids patient has had not very much to eat today except for a blueberry scone. However, patient and family are adamant that they do not want IV fluids. Patient also was initially asked by me if he wanted pain medication to which she said yes but when we offered him morphine and his wife said "that's too  strong" and now the patient is refusing pain medication of any variety. This is certainly their choice. She states "his pain is not that bad" and he agrees. They are obviously in  a position to dictate the terms of their care, and all I can do really is advise them although of course we won't do anything that jeopardizes his health. The patient's EKG is nondiagnostic and I think this epigastric constant abdominal pain since yesterday that's worse with food is not likely to be ACS but we will send a troponin. Normal with is likely to be a PE.   ----------------------------------------- 3:24 PM on 07/08/2016 -----------------------------------------  D/w Dr. Rogue Bussing, his on-call oncologist, we discussed the patient's recent PET scan, his CT scan today, his chest x-ray, his vital signs, his chief complaint, his lab work etc. He would like to start the patient on Carafate, patient has no discomfort at this time. I explained to the family that there suddenly possibility he could worsen but at this time we are very reassured by all of his findings. They're very understanding of this. They're eager to go home. Patient's sodium was slightly low but they did not wish any more IV fluid. Oncology will recheck that for them, and I have advised that he can have some  salt in the next 2 days as he is on a salt restricted diet for his CHF. Extensive return precautions and follow-up given and understood patient and family very comfortable with this plan abdomen completely benign on discharge. ____________________________________________   FINAL CLINICAL IMPRESSION(S) / ED DIAGNOSES  Final diagnoses:  None      This chart was dictated using voice recognition software.  Despite best efforts to proofread,  errors can occur which can change meaning.      Schuyler Amor, MD 07/08/16 1254    Schuyler Amor, MD 07/08/16 [phone]

## 2016-07-08 NOTE — ED Notes (Signed)
Pt back from CT

## 2016-07-08 NOTE — Progress Notes (Signed)
MARZELL, ISAKSON ([id number]) Visit Report for 07/06/2016 Arrival Information Details Patient Name: [name], [name]. Date of Service: 07/06/2016 1:30 PM Medical Record Number: [id number] Patient Account Number: [id number] Date of Birth/Sex: November 17, 1940 (76 y.o. Male) Treating RN: Ahmed Prima Primary Care Lakera Viall: Enid Derry Other Clinician: Referring Jamol Ginyard: Enid Derry Treating Kellie Chisolm/Extender: Frann Rider in Treatment: 16 Visit Information History Since Last Visit All ordered tests and consults were completed: No Patient Arrived: Ambulatory Added or deleted any medications: No Arrival Time: 13:32 Any new allergies or adverse reactions: No Accompanied By: wife Had a fall or experienced change in No Transfer Assistance: None activities of daily living that may affect Patient Identification Verified: Yes risk of falls: Secondary Verification Process Yes Signs or symptoms of abuse/neglect since last No Completed: visito Patient Requires Transmission- No Hospitalized since last visit: No Based Precautions: Has Dressing in Place as Prescribed: Yes Patient Has Alerts: Yes Pain Present Now: Yes Patient Alerts: Patient on Blood Thinner DM II 81mg  aspirin twice daily Electronic Signature(s) Signed: 07/06/2016 4:08:27 PM By: Alric Quan Entered By: Alric Quan on 07/06/2016 13:35:06 [name] ([id number]) -------------------------------------------------------------------------------- Encounter Discharge Information Details Patient Name: [name] Date of Service: 07/06/2016 1:30 PM Medical Record Number: [id number] Patient Account Number: [id number] Date of Birth/Sex: [birth year] (76 y.o. Male) Treating RN: Ahmed Prima Primary Care Jauna Raczynski: Enid Derry Other Clinician: Referring Joyceann Kruser: Enid Derry Treating Janiyla Long/Extender: Frann Rider in Treatment: 8 Encounter Discharge Information Items Discharge Pain Level:  3 Discharge Condition: Stable Ambulatory Status: Ambulatory Discharge Destination: Home Transportation: Private Auto Accompanied By: wife Schedule Follow-up Appointment: Yes Medication Reconciliation completed and provided to Patient/Care No Breeze Berringer: Provided on Clinical Summary of Care: 07/06/2016 Form Type Recipient Paper Patient FW Electronic Signature(s) Signed: 07/06/2016 2:08:33 PM By: Ruthine Dose Entered By: Ruthine Dose on 07/06/2016 14:08:33 [name] ([id number]) -------------------------------------------------------------------------------- Lower Extremity Assessment Details Patient Name: [name] Date of Service: 07/06/2016 1:30 PM Medical Record Number: [id number] Patient Account Number: [id number] Date of Birth/Sex: 10-Jun-1940 (76 y.o. Male) Treating RN: Ahmed Prima Primary Care Bhavik Cabiness: Enid Derry Other Clinician: Referring Laurine Kuyper: Enid Derry Treating Khoa Opdahl/Extender: Frann Rider in Treatment: 16 Vascular Assessment Pulses: Dorsalis Pedis Doppler Audible: [Right:Yes] Posterior Tibial Extremity colors, hair growth, and conditions: Extremity Color: [Right:Normal] Temperature of Extremity: [Right:Warm] Capillary Refill: [Right:< 3 seconds] Toe Nail Assessment Left: Right: Thick: Yes Discolored: Yes Deformed: Yes Improper Length and Hygiene: No Electronic Signature(s) Signed: 07/06/2016 4:08:27 PM By: Alric Quan Entered By: Alric Quan on 07/06/2016 13:45:45 [name] ([id number]) -------------------------------------------------------------------------------- Multi Wound Chart Details Patient Name: [name] Date of Service: 07/06/2016 1:30 PM Medical Record Number: [id number] Patient Account Number: [id number] Date of Birth/Sex: July 31, 1940 (76 y.o. Male) Treating RN: Ahmed Prima Primary Care Khira Cudmore: Enid Derry Other Clinician: Referring Kable Haywood: Enid Derry Treating  Milla Wahlberg/Extender: Frann Rider in Treatment: 16 Vital Signs Height(in): 67 Pulse(bpm): 73 Weight(lbs): 136 Blood Pressure 150/59 (mmHg): Body Mass Index(BMI): 21 Temperature(F): 97.8 Respiratory Rate 16 (breaths/min): Photos: [1:No Photos] [2:No Photos] [3:No Photos] Wound Location: [1:Right Malleolus - Lateral] [2:Right Foot - Dorsal] [3:Right Foot - Plantar] Wounding Event: [1:Gradually Appeared] [2:Gradually Appeared] [3:Pressure Injury] Primary Etiology: [1: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] [3:Diabetic Wound/Ulcer of the Lower Extremity] Secondary Etiology: [1:Diabetic Wound/Ulcer of the Lower Extremity] [2:N/A] [3:N/A] Comorbid History: [1:Cataracts, Chronic sinus problems/congestion, Anemia, Sleep Apnea, Hypertension, Myocardial Infarction, Type II Diabetes, Received Chemotherapy, Received Radiation] [2:Cataracts, Chronic sinus problems/congestion, Anemia, Sleep  Apnea, Hypertension, Myocardial Infarction, Type II Diabetes,  Received Chemotherapy, Received Radiation] [3:Cataracts, Chronic sinus problems/congestion, Anemia, Sleep Apnea, Hypertension, Myocardial Infarction, Type II Diabetes, Received Chemotherapy,  Received Radiation] Date Acquired: [1:01/09/2016] [2:06/02/2016] [3:06/15/2016] Weeks of Treatment: [1:16] [2:4] [3:2] Wound Status: [1:Open] [2:Open] [3:Open] Measurements L x W x D 0.8x0.7x0.2 [2:0x0x0] [3:1.1x0.8x0.1] (cm) Area (cm) : [1:0.44] [2:0] [3:0.691] Volume (cm) : [1:0.088] [2:0] [3:0.069] % Reduction in Area: [1:-86.40%] [2:100.00%] [3:60.90%] % Reduction in Volume: -266.70% [2:100.00%] [3:97.40%] Classification: [1:Category/Stage III] [2:Grade 1] [3:Grade 1] HBO Classification: [1:Grade 1] [2:N/A] [3:N/A] Exudate Amount: [1:Medium] [2:None Present] [3:Large] Exudate Type: [1:Serous] [2:N/A] [3:Serous] Exudate Color: [1:amber] [2:N/A] [3:amber] Wound Margin: [1:Flat and Intact] [2:Flat and Intact] [3:Distinct,  outline attached] Granulation Amount: Small (1-33%) None Present (0%) Medium (34-66%) Granulation Quality: Pink, Pale N/A Pink, Pale Necrotic Amount: Large (67-100%) None Present (0%) Medium (34-66%) Exposed Structures: Fat Layer (Subcutaneous Fat Layer (Subcutaneous Fat Layer (Subcutaneous Tissue) Exposed: Yes Tissue) Exposed: Yes Tissue) Exposed: Yes Fascia: No Fascia: No Fascia: No Tendon: No Tendon: No Tendon: No Muscle: No Muscle: No Muscle: No Joint: No Joint: No Joint: No Bone: No Bone: No Bone: No Epithelialization: None Large (67-100%) None Debridement: N/A N/A Debridement ([id number]) Pre-procedure N/A N/A 13:49 Verification/Time Out Taken: Pain Control: N/A N/A Lidocaine 4% Topical Solution Tissue Debrided: N/A N/A Fibrin/Slough, Exudates, Subcutaneous Level: N/A N/A Skin/Subcutaneous Tissue Debridement Area (sq N/A N/A 0.88 cm): Instrument: N/A N/A Curette Bleeding: N/A N/A Minimum Hemostasis Achieved: N/A N/A Pressure Procedural Pain: N/A N/A 0 Post Procedural Pain: N/A N/A 0 Debridement Treatment N/A N/A Procedure was tolerated Response: well Post Debridement N/A N/A 1.1x0.2x0.1 Measurements L x W x D (cm) Post Debridement N/A N/A 0.017 Volume: (cm) Periwound Skin Texture: Induration: Yes Excoriation: No Callus: Yes Induration: No Excoriation: No Callus: No Induration: No Crepitus: No Crepitus: No Rash: No Rash: No Scarring: No Scarring: No Periwound Skin Maceration: No Maceration: No Maceration: Yes Moisture: Dry/Scaly: No Dry/Scaly: No Dry/Scaly: No Periwound Skin Color: Erythema: Yes Atrophie Blanche: No Atrophie Blanche: No Cyanosis: No Cyanosis: No Ecchymosis: No Ecchymosis: No Erythema: No Erythema: No Hemosiderin Staining: No Hemosiderin Staining: No Mottled: No Mottled: No BO, TEICHER ([id number]) Pallor: No Pallor: No Rubor: No Rubor: No Erythema Location: Circumferential N/A N/A Erythema Change:  Decreased N/A N/A Temperature: No Abnormality No Abnormality No Abnormality Tenderness on Yes Yes Yes Palpation: Wound Preparation: Ulcer Cleansing: Ulcer Cleansing: Ulcer Cleansing: Rinsed/Irrigated with Rinsed/Irrigated with Rinsed/Irrigated with Saline Saline Saline Topical Anesthetic Topical Anesthetic Topical Anesthetic Applied: Other: lidocaine Applied: None Applied: Other: lidocaine 4% 4% Procedures Performed: N/A N/A Debridement Treatment Notes Wound #1 (Right, Lateral Malleolus) 1. Cleansed with: Clean wound with Normal Saline 2. Anesthetic Topical Lidocaine 4% cream to wound bed prior to debridement 3. Peri-wound Care: Skin Prep 4. Dressing Applied: Hydrogel 5. Secondary Dressing Applied Bordered Foam Dressing Dry Gauze Notes sorbact Wound #3 (Right, Plantar Foot) 1. Cleansed with: Clean wound with Normal Saline 2. Anesthetic Topical Lidocaine 4% cream to wound bed prior to debridement 3. Peri-wound Care: Skin Prep 4. Dressing Applied: Aquacel Ag 5. Secondary Dressing Applied Bordered Foam Dressing Dry Gauze Notes drawtex LUCY, WOOLEVER ([id number]) Electronic Signature(s) Signed: 07/06/2016 1:59:04 PM By: Christin Fudge MD, FACS Entered By: Christin Fudge on 07/06/2016 13:59:03 [name] ([id number]) -------------------------------------------------------------------------------- [place] Details Patient Name: MAKELL, CYR Date of Service: 07/06/2016 1:30 PM Medical Record Number: [id number] Patient Account Number: [id number] Date of Birth/Sex: [birth year] (76 y.o. Male) Treating RN: Ahmed Prima Primary Care Elim Peale: Enid Derry Other Clinician: Referring  Nicandro Perrault: Enid Derry Treating Andersen Iorio/Extender: Frann Rider in Treatment: 16 Active Inactive ` Orientation to the Wound Care Program Nursing Diagnoses: Knowledge deficit related to the wound healing center program Goals: Patient/caregiver will  verbalize understanding of the [place] Program Date Initiated: 03/10/2016 Target Resolution Date: 03/23/2016 Goal Status: Active Interventions: Provide education on orientation to the wound center Notes: ` Soft Tissue Infection Nursing Diagnoses: Impaired tissue integrity Potential for infection: soft tissue Goals: Patient will remain free of wound infection Date Initiated: 03/10/2016 Target Resolution Date: 03/17/2016 Goal Status: Active Interventions: Assess signs and symptoms of infection every visit Notes: ` Wound/Skin Impairment Nursing Diagnoses: Impaired tissue integrity FEDERICO, MAIORINO ([id number]) Goals: Ulcer/skin breakdown will heal within 14 weeks Date Initiated: 03/10/2016 Target Resolution Date: 03/24/2016 Goal Status: Active Interventions: Assess patient/caregiver ability to obtain necessary supplies Notes: Electronic Signature(s) Signed: 07/06/2016 4:08:27 PM By: Alric Quan Entered By: Alric Quan on 07/06/2016 13:45:53 [name] ([id number]) -------------------------------------------------------------------------------- Pain Assessment Details Patient Name: [name] Date of Service: 07/06/2016 1:30 PM Medical Record Number: [id number] Patient Account Number: [id number] Date of Birth/Sex: 04-09-1940 (77 y.o. Male) Treating RN: Ahmed Prima Primary Care Rateel Beldin: Enid Derry Other Clinician: Referring Janeice Stegall: Enid Derry Treating Isabelle Matt/Extender: Frann Rider in Treatment: 16 Active Problems Location of Pain Severity and Description of Pain Patient Has Paino Yes Site Locations Pain Location: Pain in Ulcers With Dressing Change: Yes Rate the pain. Current Pain Level: 4 Character of Pain Describe the Pain: Aching Pain Management and Medication Current Pain Management: Electronic Signature(s) Signed: 07/06/2016 4:08:27 PM By: Alric Quan Entered By: Alric Quan on 07/06/2016  13:35:25 [name] ([id number]) -------------------------------------------------------------------------------- Patient/Caregiver Education Details Patient Name: [name] Date of Service: 07/06/2016 1:30 PM Medical Record Number: [id number] Patient Account Number: [id number] Date of Birth/Gender: 06/28/1940 (76 y.o. Male) Treating RN: Ahmed Prima Primary Care Physician: Enid Derry Other Clinician: Referring Physician: Enid Derry Treating Physician/Extender: Frann Rider in Treatment: 16 Education Assessment Education Provided To: Patient Education Topics Provided Wound/Skin Impairment: Handouts: Other: change dressing as ordered Methods: Demonstration, Explain/Verbal Responses: State content correctly Electronic Signature(s) Signed: 07/06/2016 4:08:27 PM By: Alric Quan Entered By: Alric Quan on 07/06/2016 13:46:33 [name] ([id number]) -------------------------------------------------------------------------------- Wound Assessment Details Patient Name: [name] Date of Service: 07/06/2016 1:30 PM Medical Record Number: [id number] Patient Account Number: [id number] Date of Birth/Sex: 01-30-1941 (76 y.o. Male) Treating RN: Ahmed Prima Primary Care Kamile Fassler: Enid Derry Other Clinician: Referring Robertlee Rogacki: Enid Derry Treating Neville Walston/Extender: Frann Rider in Treatment: 16 Wound Status Wound Number: 1 Primary Pressure Ulcer Etiology: Wound Location: Right Malleolus - Lateral Secondary Diabetic Wound/Ulcer of the Lower Wounding Event: Gradually Appeared Etiology: Extremity Date Acquired: 01/09/2016 Wound Open Weeks Of Treatment: 16 Status: Clustered Wound: No Comorbid Cataracts, Chronic sinus History: problems/congestion, Anemia, Sleep Apnea, Hypertension, Myocardial Infarction, Type II Diabetes, Received Chemotherapy, Received Radiation Photos Photo Uploaded By: Alric Quan on 07/06/2016  16:04:38 Wound Measurements Length: (cm) 0.8 Width: (cm) 0.7 Depth: (cm) 0.2 Area: (cm) 0.44 Volume: (cm) 0.088 % Reduction in Area: -86.4% % Reduction in Volume: -266.7% Epithelialization: None Tunneling: No Undermining: No Wound Description 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 Severity (Wagner): Grade 1 Slough/Fibrin Wound Margin: Flat and Intact Exudate Amount: Medium Exudate Type: Serous Exudate Color: amber WILLIAN, DONSON ([id number]) er Cleansing: No o Yes Wound Bed Granulation Amount: Small (1-33%) Exposed Structure Granulation Quality: Pink, Pale Fascia Exposed: No Necrotic Amount: Large (67-100%) Fat Layer (Subcutaneous Tissue) Exposed: Yes Necrotic Quality:  Adherent Slough Tendon Exposed: No Muscle Exposed: No Joint Exposed: No Bone Exposed: No Periwound Skin Texture Texture Color No Abnormalities Noted: No No Abnormalities Noted: No Induration: Yes Erythema: Yes Erythema Location: Circumferential Moisture Erythema Change: Decreased No Abnormalities Noted: No Dry / Scaly: No Temperature / Pain Maceration: No Temperature: No Abnormality Tenderness on Palpation: Yes Wound Preparation Ulcer Cleansing: Rinsed/Irrigated with Saline Topical Anesthetic Applied: Other: lidocaine 4%, Treatment Notes Wound #1 (Right, Lateral Malleolus) 1. Cleansed with: Clean wound with Normal Saline 2. Anesthetic Topical Lidocaine 4% cream to wound bed prior to debridement 3. Peri-wound Care: Skin Prep 4. Dressing Applied: Hydrogel 5. Secondary Dressing Applied Bordered Foam Dressing Dry Gauze Notes sorbact Electronic Signature(s) Signed: 07/06/2016 4:08:27 PM By: Alric Quan Entered By: Alric Quan on 07/06/2016 13:44:28 [name] ([id number]) -------------------------------------------------------------------------------- Wound Assessment Details Patient Name: [name] Date of Service: 07/06/2016 1:30  PM Medical Record Number: [id number] Patient Account Number: [id number] Date of Birth/Sex: 03-Mar-1940 (76 y.o. Male) Treating RN: Ahmed Prima Primary Care Jamond Neels: Enid Derry Other Clinician: Referring Shiraz Bastyr: Enid Derry Treating Joab Carden/Extender: Frann Rider in Treatment: 16 Wound Status Wound Number: 2 Primary Diabetic Wound/Ulcer of the Lower Etiology: Extremity Wound Location: Right Foot - Dorsal Wound Open Wounding Event: Gradually Appeared Status: Date Acquired: 06/02/2016 Comorbid Cataracts, Chronic sinus Weeks Of Treatment: 4 History: problems/congestion, Anemia, Sleep Clustered Wound: No Apnea, Hypertension, Myocardial Infarction, Type II Diabetes, Received Chemotherapy, Received Radiation Photos Photo Uploaded By: Alric Quan on 07/06/2016 16:05:22 Wound Measurements Length: (cm) 0 % Reduction in Width: (cm) 0 % Reduction in Depth: (cm) 0 Epithelializati Area: (cm) 0 Tunneling: Volume: (cm) 0 Undermining: Area: 100% Volume: 100% on: Large (67-100%) No No Wound Description Classification: Grade 1 Foul Odor After Wound Margin: Flat and Intact Slough/Fibrino Exudate Amount: None Present Cleansing: No No Wound Bed Granulation Amount: None Present (0%) Exposed Structure Necrotic Amount: None Present (0%) Fascia Exposed: No Fat Layer (Subcutaneous Tissue) Exposed: Yes Tendon Exposed: No KESHAV, WINEGAR ([id number]) Muscle Exposed: No Joint Exposed: No Bone Exposed: No Periwound Skin Texture Texture Color No Abnormalities Noted: No No Abnormalities Noted: No Callus: No Atrophie Blanche: No Crepitus: No Cyanosis: No Excoriation: No Ecchymosis: No Induration: No Erythema: No Rash: No Hemosiderin Staining: No Scarring: No Mottled: No Pallor: No Moisture Rubor: No No Abnormalities Noted: No Dry / Scaly: No Temperature / Pain Maceration: No Temperature: No Abnormality Tenderness on Palpation: Yes Wound  Preparation Ulcer Cleansing: Rinsed/Irrigated with Saline Topical Anesthetic Applied: None Electronic Signature(s) Signed: 07/06/2016 4:08:27 PM By: Alric Quan Entered By: Alric Quan on 07/06/2016 13:49:41 [name] ([id number]) -------------------------------------------------------------------------------- Wound Assessment Details Patient Name: [name] Date of Service: 07/06/2016 1:30 PM Medical Record Number: [id number] Patient Account Number: [id number] Date of Birth/Sex: [birth year] (76 y.o. Male) Treating RN: Ahmed Prima Primary Care Shemeka Wardle: Enid Derry Other Clinician: Referring Paris Hohn: Enid Derry Treating Kashmere Staffa/Extender: Frann Rider in Treatment: 16 Wound Status Wound Number: 3 Primary Diabetic Wound/Ulcer of the Lower Etiology: Extremity Wound Location: Right Foot - Plantar Wound Open Wounding Event: Pressure Injury Status: Date Acquired: 06/15/2016 Comorbid Cataracts, Chronic sinus Weeks Of Treatment: 2 History: problems/congestion, Anemia, Sleep Clustered Wound: No Apnea, Hypertension, Myocardial Infarction, Type II Diabetes, Received Chemotherapy, Received Radiation Photos Photo Uploaded By: Alric Quan on 07/06/2016 16:05:22 Wound Measurements Length: (cm) 1.1 Width: (cm) 0.8 Depth: (cm) 0.1 Area: (cm) 0.691 Volume: (cm) 0.069 % Reduction in Area: 60.9% % Reduction in Volume: 97.4% Epithelialization: None Tunneling: No Undermining: No Wound Description Classification:  Grade 1 Foul Odor Aft Wound Margin: Distinct, outline attached Slough/Fibrin Exudate Amount: Large Exudate Type: Serous Exudate Color: amber er Cleansing: No o Yes Wound Bed Granulation Amount: Medium (34-66%) Exposed Structure Granulation Quality: Pink, Pale Fascia Exposed: No CLINTEN, HOWK ([id number]) Necrotic Amount: Medium (34-66%) Fat Layer (Subcutaneous Tissue) Exposed: Yes Necrotic Quality: Adherent Slough Tendon  Exposed: No Muscle Exposed: No Joint Exposed: No Bone Exposed: No Periwound Skin Texture Texture Color No Abnormalities Noted: No No Abnormalities Noted: No Callus: Yes Atrophie Blanche: No Crepitus: No Cyanosis: No Excoriation: No Ecchymosis: No Induration: No Erythema: No Rash: No Hemosiderin Staining: No Scarring: No Mottled: No Pallor: No Moisture Rubor: No No Abnormalities Noted: No Dry / Scaly: No Temperature / Pain Maceration: Yes Temperature: No Abnormality Tenderness on Palpation: Yes Wound Preparation Ulcer Cleansing: Rinsed/Irrigated with Saline Topical Anesthetic Applied: Other: lidocaine 4%, Treatment Notes Wound #3 (Right, Plantar Foot) 1. Cleansed with: Clean wound with Normal Saline 2. Anesthetic Topical Lidocaine 4% cream to wound bed prior to debridement 3. Peri-wound Care: Skin Prep 4. Dressing Applied: Aquacel Ag 5. Secondary Dressing Applied Bordered Foam Dressing Dry Gauze Notes drawtex Electronic Signature(s) Signed: 07/06/2016 4:08:27 PM By: Alric Quan Entered By: Alric Quan on 07/06/2016 13:45:20 [name] ([id number]) -------------------------------------------------------------------------------- [place] Details Patient Name: [name] Date of Service: 07/06/2016 1:30 PM Medical Record Number: [id number] Patient Account Number: [id number] Date of Birth/Sex: Oct 17, 1940 (76 y.o. Male) Treating RN: Ahmed Prima Primary Care Adell Koval: Enid Derry Other Clinician: Referring Lesleyanne Politte: Enid Derry Treating Lavelle Akel/Extender: Frann Rider in Treatment: 16 Vital Signs Time Taken: 13:35 Temperature (F): 97.8 Height (in): 67 Pulse (bpm): 73 Weight (lbs): 136 Respiratory Rate (breaths/min): 16 Body Mass Index (BMI): 21.3 Blood Pressure (mmHg): 150/59 Reference Range: 80 - 120 mg / dl Electronic Signature(s) Signed: 07/06/2016 4:08:27 PM By: Alric Quan Entered By: Alric Quan on  07/06/2016 13:37:15

## 2016-07-11 ENCOUNTER — Telehealth: Payer: Self-pay | Admitting: *Deleted

## 2016-07-11 NOTE — Telephone Encounter (Signed)
Called to state he does not want any more chemo and does not want to keep 5/31 appt unless the doctor wants him to come in to talk. You can call her back if they are to come in, otherwise, cancel the appt.

## 2016-07-13 ENCOUNTER — Inpatient Hospital Stay: Payer: Medicare Other

## 2016-07-13 ENCOUNTER — Inpatient Hospital Stay: Payer: Medicare Other | Admitting: Oncology

## 2016-07-13 DIAGNOSIS — C155 Malignant neoplasm of lower third of esophagus: Secondary | ICD-10-CM | POA: Diagnosis not present

## 2016-07-13 DIAGNOSIS — E039 Hypothyroidism, unspecified: Secondary | ICD-10-CM | POA: Diagnosis not present

## 2016-07-13 DIAGNOSIS — F0390 Unspecified dementia without behavioral disturbance: Secondary | ICD-10-CM | POA: Diagnosis not present

## 2016-07-13 DIAGNOSIS — M5134 Other intervertebral disc degeneration, thoracic region: Secondary | ICD-10-CM | POA: Diagnosis not present

## 2016-07-13 DIAGNOSIS — R634 Abnormal weight loss: Secondary | ICD-10-CM | POA: Diagnosis not present

## 2016-07-13 DIAGNOSIS — E119 Type 2 diabetes mellitus without complications: Secondary | ICD-10-CM | POA: Diagnosis not present

## 2016-07-13 DIAGNOSIS — L89510 Pressure ulcer of right ankle, unstageable: Secondary | ICD-10-CM | POA: Diagnosis not present

## 2016-07-13 DIAGNOSIS — C787 Secondary malignant neoplasm of liver and intrahepatic bile duct: Secondary | ICD-10-CM | POA: Diagnosis not present

## 2016-07-13 DIAGNOSIS — Z7984 Long term (current) use of oral hypoglycemic drugs: Secondary | ICD-10-CM | POA: Diagnosis not present

## 2016-07-13 DIAGNOSIS — I10 Essential (primary) hypertension: Secondary | ICD-10-CM | POA: Diagnosis not present

## 2016-07-13 DIAGNOSIS — R1310 Dysphagia, unspecified: Secondary | ICD-10-CM | POA: Diagnosis not present

## 2016-07-13 DIAGNOSIS — Z7982 Long term (current) use of aspirin: Secondary | ICD-10-CM | POA: Diagnosis not present

## 2016-07-13 DIAGNOSIS — M1991 Primary osteoarthritis, unspecified site: Secondary | ICD-10-CM | POA: Diagnosis not present

## 2016-07-13 DIAGNOSIS — S90421D Blister (nonthermal), right great toe, subsequent encounter: Secondary | ICD-10-CM | POA: Diagnosis not present

## 2016-07-13 DIAGNOSIS — G629 Polyneuropathy, unspecified: Secondary | ICD-10-CM | POA: Diagnosis not present

## 2016-07-13 DIAGNOSIS — D509 Iron deficiency anemia, unspecified: Secondary | ICD-10-CM | POA: Diagnosis not present

## 2016-07-13 DIAGNOSIS — M810 Age-related osteoporosis without current pathological fracture: Secondary | ICD-10-CM | POA: Diagnosis not present

## 2016-07-14 ENCOUNTER — Encounter: Attending: Surgery | Admitting: Surgery

## 2016-07-14 DIAGNOSIS — L97512 Non-pressure chronic ulcer of other part of right foot with fat layer exposed: Secondary | ICD-10-CM | POA: Diagnosis not present

## 2016-07-14 DIAGNOSIS — L89510 Pressure ulcer of right ankle, unstageable: Secondary | ICD-10-CM | POA: Diagnosis not present

## 2016-07-14 DIAGNOSIS — C787 Secondary malignant neoplasm of liver and intrahepatic bile duct: Secondary | ICD-10-CM | POA: Diagnosis not present

## 2016-07-14 DIAGNOSIS — E11621 Type 2 diabetes mellitus with foot ulcer: Secondary | ICD-10-CM | POA: Diagnosis not present

## 2016-07-14 DIAGNOSIS — I10 Essential (primary) hypertension: Secondary | ICD-10-CM | POA: Diagnosis not present

## 2016-07-14 DIAGNOSIS — M1991 Primary osteoarthritis, unspecified site: Secondary | ICD-10-CM | POA: Diagnosis not present

## 2016-07-14 DIAGNOSIS — F0390 Unspecified dementia without behavioral disturbance: Secondary | ICD-10-CM | POA: Diagnosis not present

## 2016-07-14 DIAGNOSIS — D649 Anemia, unspecified: Secondary | ICD-10-CM | POA: Insufficient documentation

## 2016-07-14 DIAGNOSIS — Z880 Allergy status to penicillin: Secondary | ICD-10-CM | POA: Diagnosis not present

## 2016-07-14 DIAGNOSIS — M810 Age-related osteoporosis without current pathological fracture: Secondary | ICD-10-CM | POA: Diagnosis not present

## 2016-07-14 DIAGNOSIS — L89512 Pressure ulcer of right ankle, stage 2: Secondary | ICD-10-CM | POA: Insufficient documentation

## 2016-07-14 DIAGNOSIS — Z7984 Long term (current) use of oral hypoglycemic drugs: Secondary | ICD-10-CM | POA: Diagnosis not present

## 2016-07-14 DIAGNOSIS — Z882 Allergy status to sulfonamides status: Secondary | ICD-10-CM | POA: Insufficient documentation

## 2016-07-14 DIAGNOSIS — C155 Malignant neoplasm of lower third of esophagus: Secondary | ICD-10-CM | POA: Insufficient documentation

## 2016-07-14 DIAGNOSIS — R1310 Dysphagia, unspecified: Secondary | ICD-10-CM | POA: Diagnosis not present

## 2016-07-14 DIAGNOSIS — G4730 Sleep apnea, unspecified: Secondary | ICD-10-CM | POA: Diagnosis not present

## 2016-07-14 DIAGNOSIS — D509 Iron deficiency anemia, unspecified: Secondary | ICD-10-CM | POA: Diagnosis not present

## 2016-07-14 DIAGNOSIS — L89513 Pressure ulcer of right ankle, stage 3: Secondary | ICD-10-CM | POA: Diagnosis not present

## 2016-07-14 DIAGNOSIS — L89521 Pressure ulcer of left ankle, stage 1: Secondary | ICD-10-CM | POA: Insufficient documentation

## 2016-07-14 DIAGNOSIS — M5134 Other intervertebral disc degeneration, thoracic region: Secondary | ICD-10-CM | POA: Diagnosis not present

## 2016-07-14 DIAGNOSIS — S90421D Blister (nonthermal), right great toe, subsequent encounter: Secondary | ICD-10-CM | POA: Diagnosis not present

## 2016-07-14 DIAGNOSIS — E119 Type 2 diabetes mellitus without complications: Secondary | ICD-10-CM | POA: Diagnosis not present

## 2016-07-14 DIAGNOSIS — E039 Hypothyroidism, unspecified: Secondary | ICD-10-CM | POA: Diagnosis not present

## 2016-07-14 DIAGNOSIS — Z7982 Long term (current) use of aspirin: Secondary | ICD-10-CM | POA: Diagnosis not present

## 2016-07-14 DIAGNOSIS — R634 Abnormal weight loss: Secondary | ICD-10-CM | POA: Diagnosis not present

## 2016-07-14 DIAGNOSIS — E441 Mild protein-calorie malnutrition: Secondary | ICD-10-CM | POA: Insufficient documentation

## 2016-07-14 DIAGNOSIS — G629 Polyneuropathy, unspecified: Secondary | ICD-10-CM | POA: Diagnosis not present

## 2016-07-16 NOTE — Progress Notes (Signed)
CLOYDE, Jensen ([id number]) Visit Report for 07/14/2016 Allergy List Details Patient Name: [name], [name]. Date of Service: 07/14/2016 11:15 AM Medical Record Number: [id number] Patient Account Number: [id number] Date of Birth/Sex: Jul 01, 1940 (76 y.o. Male) Treating RN: Baruch Gouty, RN, BSN, Velva Harman Primary Care Trevin Gartrell: [place] D[place] Other Clinician: Referring Amorita Vanrossum: [place] D[place] Treating Marlie Kuennen/Extender: Frann Rider in Treatment: 18 Allergies Active Allergies sufa drugs Cephalosporins lisinopril losartin Keflex amoxicillin ampicillin Septra Bactrim Allergy Notes Electronic Signature(s) Signed: 07/14/2016 4:25:17 PM By: Regan Lemming BSN, RN Entered By: Regan Lemming on 07/14/2016 11:55:11 [name] ([id number]) -------------------------------------------------------------------------------- [place] Details Patient Name: [name] Date of Service: 07/14/2016 11:15 AM Medical Record Number: [id number] Patient Account Number: [id number] Date of Birth/Sex: 13-Dec-1940 (76 y.o. Male) Treating RN: Baruch Gouty, RN, BSN, [place] Primary Care Boykin Baetz: [place] Derry Other Clinician: Referring Field Staniszewski: [place] Derry Treating Ketrina Boateng/Extender: Frann Rider in Treatment: 18 Visit Information Patient Arrived: Ambulatory Arrival Time: 11:20 Accompanied By: wife Transfer Assistance: None Patient Identification Verified: Yes Secondary Verification Process Yes Completed: Patient Requires Transmission- No Based Precautions: Patient Has Alerts: Yes Patient Alerts: Patient on Blood Thinner DM II 81mg  aspirin twice daily History Since Last Visit All ordered tests and consults were completed: No Added or deleted any medications: No Any new allergies or adverse reactions: No Had a fall or experienced change in activities of daily living that may affect risk of falls: No Signs or symptoms of abuse/neglect since last visito No Hospitalized since last visit:  No Has Dressing in Place as Prescribed: Yes Electronic Signature(s) Signed: 07/14/2016 4:25:17 PM By: Regan Lemming BSN, RN Entered By: Regan Lemming on 07/14/2016 11:21:44 [name] ([id number]) -------------------------------------------------------------------------------- Encounter Discharge Information Details Patient Name: [name] Date of Service: 07/14/2016 11:15 AM Medical Record Number: [id number] Patient Account Number: [id number] Date of Birth/Sex: Feb 15, 1940 (76 y.o. Male) Treating RN: Baruch Gouty, RN, BSN, Velva Harman Primary Care Harrold Fitchett: [place] Derry Other Clinician: Referring Kaison Mcparland: [place] D[place] Treating Merrill Villarruel/Extender: Frann Rider in Treatment: 51 Encounter Discharge Information Items Discharge Pain Level: 0 Discharge Condition: Stable Ambulatory Status: Ambulatory Discharge Destination: Home Private Transportation: Auto Accompanied By: wife Schedule Follow-up Appointment: No Medication Reconciliation completed and No provided to Patient/Care Jadore Veals: Clinical Summary of Care: Electronic Signature(s) Signed: 07/14/2016 4:25:17 PM By: Regan Lemming BSN, RN Previous Signature: 07/14/2016 11:56:24 AM Version By: Ruthine Dose Entered By: Regan Lemming on 07/14/2016 11:56:51 [name] ([id number]) -------------------------------------------------------------------------------- Lower Extremity Assessment Details Patient Name: [name] Date of Service: 07/14/2016 11:15 AM Medical Record Number: [id number] Patient Account Number: [id number] Date of Birth/Sex: 04-Dec-1940 (76 y.o. Male) Treating RN: Baruch Gouty, RN, BSN, Velva Harman Primary Care Antaniya Venuti: [place] D[place] Other Clinician: Referring Shaheen Mende: [place] D[place] Treating Ezzard Ditmer/Extender: Frann Rider in Treatment: 18 Edema Assessment Assessed: [Left: No] [Right: No] Edema: [Left: N] [Right: o] Vascular Assessment Claudication: Claudication Assessment [Right:None] Pulses: Dorsalis  Pedis Palpable: [Right:Yes] Posterior Tibial Extremity colors, hair growth, and conditions: Extremity Color: [Right:Normal] Hair Growth on Extremity: [Right:Yes] Temperature of Extremity: [Right:Warm] Capillary Refill: [Right:< 3 seconds] Toe Nail Assessment Left: Right: Thick: No Discolored: No Deformed: No Improper Length and Hygiene: No Electronic Signature(s) Signed: 07/14/2016 4:25:17 PM By: Regan Lemming BSN, RN Entered By: Regan Lemming on 07/14/2016 11:37:34 [name] ([id number]) -------------------------------------------------------------------------------- Multi Wound Chart Details Patient Name: [name] Date of Service: 07/14/2016 11:15 AM Medical Record Number: [id number] Patient Account Number: [id number] Date of Birth/Sex: 1940/09/23 (76 y.o. Male) Treating RN: Baruch Gouty, RN, BSN, Velva Harman Primary Care Shubham Thackston: En[place] Derry Other Clinician:  Referring Jhoana Upham: [place] Derry Treating Starlette Thurow/Extender: Frann Rider in Treatment: 18 Vital Signs Height(in): 67 Pulse(bpm): 84 Weight(lbs): 136 Blood Pressure 137/61 (mmHg): Body Mass Index(BMI): 21 Temperature(F): 98.1 Respiratory Rate 16 (breaths/min): Photos: [1:No Photos] [3:No Photos] [N/A:N/A] Wound Location: [1:Right Malleolus - Lateral] [3:Right Foot - Plantar] [N/A:N/A] Wounding Event: [1:Gradually Appeared] [3:Pressure Injury] [N/A:N/A] Primary Etiology: [1: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] [N/A:N/A] Secondary Etiology: [1:Diabetic Wound/Ulcer of the Lower Extremity] [3:N/A] [N/A:N/A] Comorbid History: [1:Cataracts, Chronic sinus problems/congestion, Anemia, Sleep Apnea, Hypertension, Myocardial Infarction, Type II Diabetes, Received Chemotherapy, Received Radiation] [3:Cataracts, Chronic sinus problems/congestion, Anemia, Sleep  Apnea, Hypertension, Myocardial Infarction, Type II Diabetes, Received Chemotherapy, Received Radiation] [N/A:N/A] Date Acquired:  [1:01/09/2016] [3:06/15/2016] [N/A:N/A] Weeks of Treatment: [1:18] [3:4] [N/A:N/A] Wound Status: [1:Open] [3:Open] [N/A:N/A] Measurements L x W x D 0.9x0.7x0.2 [3:0.7x0.7x0.1] [N/A:N/A] (cm) Area (cm) : [1:0.495] [3:0.385] [N/A:N/A] Volume (cm) : [1:0.099] [3:0.038] [N/A:N/A] % Reduction in Area: [1:-109.70%] [3:78.20%] [N/A:N/A] % Reduction in Volume: -312.50% [3:98.60%] [N/A:N/A] Classification: [1:Category/Stage III] [3:Grade 1] [N/A:N/A] HBO Classification: [1:Grade 1] [3:N/A] [N/A:N/A] Exudate Amount: [1:Medium] [3:Large] [N/A:N/A] Exudate Type: [1:Serous] [3:Serous] [N/A:N/A] Exudate Color: [1:amber] [3:amber] [N/A:N/A] Wound Margin: [1:Flat and Intact] [3:Distinct, outline attached] [N/A:N/A] Granulation Amount: Small (1-33%) Medium (34-66%) N/A Granulation Quality: Pink, Pale Pink, Pale N/A Necrotic Amount: Large (67-100%) Medium (34-66%) N/A Exposed Structures: Fat Layer (Subcutaneous Fat Layer (Subcutaneous N/A Tissue) Exposed: Yes Tissue) Exposed: Yes Fascia: No Fascia: No Tendon: No Tendon: No Muscle: No Muscle: No Joint: No Joint: No Bone: No Bone: No Epithelialization: None None N/A Debridement: Debridement (16109- Debridement (60454- N/A 11047) 11047) Pre-procedure 11:39 11:37 N/A Verification/Time Out Taken: Pain Control: Lidocaine 4% Topical Lidocaine 4% Topical N/A Solution Solution Tissue Debrided: Fibrin/Slough, Fat, Fibrin/Slough, Fat, N/A Subcutaneous Subcutaneous Level: Skin/Subcutaneous Skin/Subcutaneous N/A Tissue Tissue Debridement Area (sq 0.63 0.49 N/A cm): Instrument: Curette Curette N/A Bleeding: Minimum Minimum N/A Hemostasis Achieved: Pressure Pressure N/A Procedural Pain: 0 0 N/A Post Procedural Pain: 0 0 N/A Debridement Treatment Procedure was tolerated Procedure was tolerated N/A Response: well well Post Debridement 0.9x0.7x0.2 0.7x0.7x0.1 N/A Measurements L x W x D (cm) Post Debridement 0.099 0.038 N/A Volume: (cm) Post  Debridement Category/Stage III N/A N/A Stage: Periwound Skin Texture: Induration: Yes Callus: Yes N/A Excoriation: No Induration: No Crepitus: No Rash: No Scarring: No Periwound Skin Maceration: No Maceration: Yes N/A Moisture: Dry/Scaly: No Dry/Scaly: No Periwound Skin Color: Erythema: Yes Atrophie Blanche: No N/A Cyanosis: No Ecchymosis: No Erythema: No MAXIM, BEDEL ([id number]) Hemosiderin Staining: No Mottled: No Pallor: No Rubor: No Erythema Location: Circumferential N/A N/A Erythema Change: Decreased N/A N/A Temperature: No Abnormality No Abnormality N/A Tenderness on Yes Yes N/A Palpation: Wound Preparation: Ulcer Cleansing: Ulcer Cleansing: N/A Rinsed/Irrigated with Rinsed/Irrigated with Saline Saline Topical Anesthetic Topical Anesthetic Applied: Other: lidocaine Applied: Other: lidocaine 4% 4% Procedures Performed: Debridement Debridement N/A Treatment Notes Wound #1 (Right, Lateral Malleolus) 1. Cleansed with: Clean wound with Normal Saline 2. Anesthetic Topical Lidocaine 4% cream to wound bed prior to debridement 3. Peri-wound Care: Skin Prep 4. Dressing Applied: Hydrogel 5. Secondary Dressing Applied Bordered Foam Dressing Dry Gauze Notes sorbact Wound #3 (Right, Plantar Foot) 1. Cleansed with: Clean wound with Normal Saline 2. Anesthetic Topical Lidocaine 4% cream to wound bed prior to debridement 3. Peri-wound Care: Skin Prep 4. Dressing Applied: Hydrogel 5. Secondary Dressing Applied Bordered Foam Dressing Dry Gauze Notes KEM, HENSEN ([id number]) sorbact Electronic Signature(s) Signed: 07/14/2016 12:02:21 PM By: Christin Fudge MD, FACS Entered By: Christin Fudge on 07/14/2016  12:02:20 SHIGEO, BAUGH ([id number]) -------------------------------------------------------------------------------- [place] Details Patient Name: ASHON, ROSENBERG. Date of Service: 07/14/2016 11:15 AM Medical Record Number:  [id number] Patient Account Number: [id number] Date of Birth/Sex: May 05, 1940 (76 y.o. Male) Treating RN: Baruch Gouty, RN, BSN, Velva Harman Primary Care Sybrina Laning: [place] D[place] Other Clinician: Referring Mella Inclan: [place] D[place] Treating Javaughn Opdahl/Extender: Frann Rider in Treatment: 7 Active Inactive ` Orientation to the Wound Care Program Nursing Diagnoses: Knowledge deficit related to the wound healing center program Goals: Patient/caregiver will verbalize understanding of the [place] Program Date Initiated: 03/10/2016 Target Resolution Date: 03/23/2016 Goal Status: Active Interventions: Provide education on orientation to the wound center Notes: ` Soft Tissue Infection Nursing Diagnoses: Impaired tissue integrity Potential for infection: soft tissue Goals: Patient will remain free of wound infection Date Initiated: 03/10/2016 Target Resolution Date: 03/17/2016 Goal Status: Active Interventions: Assess signs and symptoms of infection every visit Notes: ` Wound/Skin Impairment Nursing Diagnoses: Impaired tissue integrity DAVIAN, HANSHAW ([id number]) Goals: Ulcer/skin breakdown will heal within 14 weeks Date Initiated: 03/10/2016 Target Resolution Date: 03/24/2016 Goal Status: Active Interventions: Assess patient/caregiver ability to obtain necessary supplies Notes: Electronic Signature(s) Signed: 07/14/2016 4:25:17 PM By: Regan Lemming BSN, RN Entered By: Regan Lemming on 07/14/2016 11:37:44 [name] ([id number]) -------------------------------------------------------------------------------- Pain Assessment Details Patient Name: [name] Date of Service: 07/14/2016 11:15 AM Medical Record Number: [id number] Patient Account Number: [id number] Date of Birth/Sex: 1940-12-23 (76 y.o. Male) Treating RN: Baruch Gouty, RN, BSN, Velva Harman Primary Care Nimrod Wendt: [place] Derry Other Clinician: Referring Robby Bulkley: [place] Treating Brandalynn Ofallon/Extender: Frann Rider  in Treatment: 91 Active Problems Location of Pain Severity and Description of Pain Patient Has Paino No Site Locations With Dressing Change: No Pain Management and Medication Current Pain Management: Electronic Signature(s) Signed: 07/14/2016 4:25:17 PM By: Regan Lemming BSN, RN Entered By: Regan Lemming on 07/14/2016 11:22:03 [name] ([id number]) -------------------------------------------------------------------------------- Patient/Caregiver Education Details Patient Name: [name] Date of Service: 07/14/2016 11:15 AM Medical Record Number: [id number] Patient Account Number: [id number] Date of Birth/Gender: 1940/05/16 (76 y.o. Male) Treating RN: Baruch Gouty, RN, BSN, Velva Harman Primary Care Physician: En[place] Derry Other Clinician: Referring Physician: En[place] Derry Treating Physician/Extender: Frann Rider in Treatment: 109 Education Assessment Education Provided To: Patient Education Topics Provided Welcome To [facility]: Methods: Explain/Verbal Responses: State content correctly Wound Debridement: Methods: Explain/Verbal Responses: State content correctly Wound/Skin Impairment: Methods: Explain/Verbal Responses: State content correctly Electronic Signature(s) Signed: 07/14/2016 4:25:17 PM By: Regan Lemming BSN, RN Entered By: Regan Lemming on 07/14/2016 11:57:12 [name] ([id number]) -------------------------------------------------------------------------------- Wound Assessment Details Patient Name: [name] Date of Service: 07/14/2016 11:15 AM Medical Record Number: [id number] Patient Account Number: [id number] Date of Birth/Sex: 10-06-1940 (76 y.o. Male) Treating RN: Baruch Gouty, RN, BSN, [place] Primary Care Janis Cuffe: En[place] Derry Other Clinician: Referring Ayce Pietrzyk: [place] Derry Treating Jahmeir Geisen/Extender: Frann Rider in Treatment: 18 Wound Status Wound Number: 1 Primary Pressure Ulcer Etiology: Wound Location: Right Malleolus -  Lateral Secondary Diabetic Wound/Ulcer of the Lower Wounding Event: Gradually Appeared Etiology: Extremity Date Acquired: 01/09/2016 Wound Open Weeks Of Treatment: 18 Status: Clustered Wound: No Comorbid Cataracts, Chronic sinus History: problems/congestion, Anemia, Sleep Apnea, Hypertension, Myocardial Infarction, Type II Diabetes, Received Chemotherapy, Received Radiation Photos Photo Uploaded By: Regan Lemming on 07/14/2016 12:46:35 Wound Measurements Length: (cm) 0.9 Width: (cm) 0.7 Depth: (cm) 0.2 Area: (cm) 0.495 Volume: (cm) 0.099 % Reduction in Area: -109.7% % Reduction in Volume: -312.5% Epithelialization: None Tunneling: No Undermining: No Wound Description Classification: Category/Stage III Foul Odor Aft  Diabetic Severity Earleen Newport): Grade 1 Slough/Fibrin Wound Margin: Flat and Intact Exudate Amount: Medium Exudate Type: Serous Exudate Color: amber HARRIS, PENTON ([id number]) er Cleansing: No o Yes Wound Bed Granulation Amount: Small (1-33%) Exposed Structure Granulation Quality: Pink, Pale Fascia Exposed: No Necrotic Amount: Large (67-100%) Fat Layer (Subcutaneous Tissue) Exposed: Yes Necrotic Quality: Adherent Slough Tendon Exposed: No Muscle Exposed: No Joint Exposed: No Bone Exposed: No Periwound Skin Texture Texture Color No Abnormalities Noted: No No Abnormalities Noted: No Induration: Yes Erythema: Yes Erythema Location: Circumferential Moisture Erythema Change: Decreased No Abnormalities Noted: No Dry / Scaly: No Temperature / Pain Maceration: No Temperature: No Abnormality Tenderness on Palpation: Yes Wound Preparation Ulcer Cleansing: Rinsed/Irrigated with Saline Topical Anesthetic Applied: Other: lidocaine 4%, Treatment Notes Wound #1 (Right, Lateral Malleolus) 1. Cleansed with: Clean wound with Normal Saline 2. Anesthetic Topical Lidocaine 4% cream to wound bed prior to debridement 3. Peri-wound Care: Skin Prep 4.  Dressing Applied: Hydrogel 5. Secondary Dressing Applied Bordered Foam Dressing Dry Gauze Notes sorbact Electronic Signature(s) Signed: 07/14/2016 4:25:17 PM By: Regan Lemming BSN, RN Entered By: Regan Lemming on 07/14/2016 11:33:40 [name] ([id number]) -------------------------------------------------------------------------------- Wound Assessment Details Patient Name: [name] Date of Service: 07/14/2016 11:15 AM Medical Record Number: [id number] Patient Account Number: [id number] Date of Birth/Sex: 13-Nov-1940 (76 y.o. Male) Treating RN: Baruch Gouty, RN, BSN, [place] Primary Care Niccolas Loeper: En[place] Derry Other Clinician: Referring Blessyn [place]: [place] D[place] Treating Climmie Buelow/Extender: Frann Rider in Treatment: 18 Wound Status Wound Number: 3 Primary Diabetic Wound/Ulcer of the Lower Etiology: Extremity Wound Location: Right Foot - Plantar Wound Open Wounding Event: Pressure Injury Status: Date Acquired: 06/15/2016 Comorbid Cataracts, Chronic sinus Weeks Of Treatment: 4 History: problems/congestion, Anemia, Sleep Clustered Wound: No Apnea, Hypertension, Myocardial Infarction, Type II Diabetes, Received Chemotherapy, Received Radiation Photos Photo Uploaded By: Regan Lemming on 07/14/2016 12:48:47 Wound Measurements Length: (cm) 0.7 Width: (cm) 0.7 Depth: (cm) 0.1 Area: (cm) 0.385 Volume: (cm) 0.038 % Reduction in Area: 78.2% % Reduction in Volume: 98.6% Epithelialization: None Tunneling: No Undermining: No Wound Description Classification: Grade 1 Foul Odor Aft Wound Margin: Distinct, outline attached Slough/Fibrin Exudate Amount: Large Exudate Type: Serous Exudate Color: amber er Cleansing: No o Yes Wound Bed Granulation Amount: Medium (34-66%) Exposed Structure Granulation Quality: Pink, Pale Fascia Exposed: No KASON, BENAK ([id number]) Necrotic Amount: Medium (34-66%) Fat Layer (Subcutaneous Tissue) Exposed: Yes Necrotic Quality: Adherent  Slough Tendon Exposed: No Muscle Exposed: No Joint Exposed: No Bone Exposed: No Periwound Skin Texture Texture Color No Abnormalities Noted: No No Abnormalities Noted: No Callus: Yes Atrophie Blanche: No Crepitus: No Cyanosis: No Excoriation: No Ecchymosis: No Induration: No Erythema: No Rash: No Hemosiderin Staining: No Scarring: No Mottled: No Pallor: No Moisture Rubor: No No Abnormalities Noted: No Dry / Scaly: No Temperature / Pain Maceration: Yes Temperature: No Abnormality Tenderness on Palpation: Yes Wound Preparation Ulcer Cleansing: Rinsed/Irrigated with Saline Topical Anesthetic Applied: Other: lidocaine 4%, Treatment Notes Wound #3 (Right, Plantar Foot) 1. Cleansed with: Clean wound with Normal Saline 2. Anesthetic Topical Lidocaine 4% cream to wound bed prior to debridement 3. Peri-wound Care: Skin Prep 4. Dressing Applied: Hydrogel 5. Secondary Dressing Applied Bordered Foam Dressing Dry Gauze Notes sorbact Electronic Signature(s) Signed: 07/14/2016 4:25:17 PM By: Regan Lemming BSN, RN Entered By: Regan Lemming on 07/14/2016 11:35:51 [name] ([id number]) -------------------------------------------------------------------------------- [place] Details Patient Name: [name] Date of Service: 07/14/2016 11:15 AM Medical Record Number: [id number] Patient Account Number: [id number] Date of Birth/Sex: 18-Apr-1940 (76 y.o. Male) Treating RN:  Afful, RN, BSN, Velva Harman Primary Care Clary Boulais: [place] D[place] Other Clinician: Referring Clayton Bosserman: [place] D[place] Treating Miriana Gaertner/Extender: Frann Rider in Treatment: 18 Vital Signs Time Taken: 11:23 Temperature (F): 98.1 Height (in): 67 Pulse (bpm): 84 Weight (lbs): 136 Respiratory Rate (breaths/min): 16 Body Mass Index (BMI): 21.3 Blood Pressure (mmHg): 137/61 Reference Range: 80 - 120 mg / dl Electronic Signature(s) Signed: 07/14/2016 4:25:17 PM By: Regan Lemming BSN, RN Entered By: Regan Lemming on 07/14/2016 11:24:22

## 2016-07-16 NOTE — Progress Notes (Signed)
[name], [name] ([id number]) Visit Report for 07/14/2016 Chief Complaint Document Details Patient Name: [name], [name]. Date of Service: 07/14/2016 11:15 AM Medical Record Number: [id number] Patient Account Number: [id number] Date of Birth/Sex: Dec 24, 1940 (76 y.o. Male) Treating RN: Baruch Gouty, RN, BSN, Velva Harman Primary Care Provider: Enid Derry Other Clinician: Referring Provider: Enid Derry Treating Provider/Extender: Frann Rider in Treatment: 18 Information Obtained from: Patient Chief Complaint Patient is at the clinic for treatment of an open pressure ulcer to the right lateral ankle which she's had for about 2 months Electronic Signature(s) Signed: 07/14/2016 12:03:02 PM By: Christin Fudge MD, FACS Entered By: Christin Fudge on 07/14/2016 12:03:02 [name] ([id number]) -------------------------------------------------------------------------------- Debridement Details Patient Name: [name] Date of Service: 07/14/2016 11:15 AM Medical Record Number: [id number] Patient Account Number: [id number] Date of Birth/Sex: [birth year] (76 y.o. Male) Treating RN: Baruch Gouty, RN, BSN, [place] Primary Care Provider: Enid Derry Other Clinician: Referring Provider: Enid Derry Treating Provider/Extender: Frann Rider in Treatment: 18 Debridement Performed for Wound #1 Right,Lateral Malleolus Assessment: Performed By: Physician Christin Fudge, MD Debridement: Debridement Severity of Tissue Pre Fat layer exposed Debridement: Pre-procedure Verification/Time Out Yes - 11:39 Taken: Start Time: 11:39 Pain Control: Lidocaine 4% Topical Solution Level: Skin/Subcutaneous Tissue Total Area Debrided (L x 0.9 (cm) x 0.7 (cm) = 0.63 (cm) W): Tissue and other Non-Viable, Fat, Fibrin/Slough, Subcutaneous material debrided: Instrument: Curette Bleeding: Minimum Hemostasis Achieved: Pressure End Time: 11:41 Procedural Pain: 0 Post Procedural Pain: 0 Response to Treatment:  Procedure was tolerated well Post Debridement Measurements of Total Wound Length: (cm) 0.9 Stage: Category/Stage III Width: (cm) 0.7 Depth: (cm) 0.2 Volume: (cm) 0.099 Character of Wound/Ulcer Post Stable Debridement: Severity of Tissue Post Fat layer exposed Debridement: Post Procedure Diagnosis Same as Pre-procedure Electronic Signature(s) Signed: 07/14/2016 12:02:39 PM By: Christin Fudge MD, FACS [name] ([id number]) Signed: 07/14/2016 4:25:17 PM By: Regan Lemming BSN, RN Entered By: Christin Fudge on 07/14/2016 12:02:39 [name] ([id number]) -------------------------------------------------------------------------------- Debridement Details Patient Name: [name] Date of Service: 07/14/2016 11:15 AM Medical Record Number: [id number] Patient Account Number: [id number] Date of Birth/Sex: 04-Mar-1940 (76 y.o. Male) Treating RN: Baruch Gouty, RN, BSN, [place] Primary Care Provider: Enid Derry Other Clinician: Referring Provider: Enid Derry Treating Provider/Extender: Frann Rider in Treatment: 18 Debridement Performed for Wound #3 Right,Plantar Foot Assessment: Performed By: Physician Christin Fudge, MD Debridement: Debridement Severity of Tissue Pre Fat layer exposed Debridement: Pre-procedure Verification/Time Out Yes - 11:37 Taken: Start Time: 11:37 Pain Control: Lidocaine 4% Topical Solution Level: Skin/Subcutaneous Tissue Total Area Debrided (L x 0.7 (cm) x 0.7 (cm) = 0.49 (cm) W): Tissue and other Non-Viable, Fat, Fibrin/Slough, Subcutaneous material debrided: Instrument: Curette Bleeding: Minimum Hemostasis Achieved: Pressure End Time: 11:39 Procedural Pain: 0 Post Procedural Pain: 0 Response to Treatment: Procedure was tolerated well Post Debridement Measurements of Total Wound Length: (cm) 0.7 Width: (cm) 0.7 Depth: (cm) 0.1 Volume: (cm) 0.038 Character of Wound/Ulcer Post Stable Debridement: Severity of Tissue Post  Debridement: Fat layer exposed Post Procedure Diagnosis Same as Pre-procedure Electronic Signature(s) Signed: 07/14/2016 12:02:54 PM By: Christin Fudge MD, FACS Signed: 07/14/2016 4:25:17 PM By: Regan Lemming BSN, RN [name] ([id number]) Entered By: Christin Fudge on 07/14/2016 12:02:53 [name] ([id number]) -------------------------------------------------------------------------------- HPI Details Patient Name: [name], [name]. Date of Service: 07/14/2016 11:15 AM Medical Record Number: [id number] Patient Account Number: [id number] Date of Birth/Sex: 08/28/1940 (76 y.o. Male) Treating RN: Baruch Gouty, RN, BSN, Velva Harman Primary Care Provider: Enid Derry Other Clinician: Referring Provider: Enid Derry  Treating Provider/Extender: Frann Rider in Treatment: 18 History of Present Illness Location: Patient presents with an ulcer on the right lateral ankle. Quality: Patient reports experiencing a dull pain to affected area(s). Severity: Patient states wound are getting worse. Duration: Patient has had the wound for > 2 months prior to seeking treatment at the wound center Timing: Pain in wound is Intermittent (comes and goes Context: The wound appeared gradually over time Modifying Factors: Other treatment(s) tried include:local care as per the medical oncologist Associated Signs and Symptoms: Patient reports having:some pain and no discharge HPI Description: 76 year old patient has been referred by his medical oncologist Dr. Grayland Ormond, for a right lateral malleolus ulcer has been there for several weeks. 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 He is currently on chemotherapy with FOLFOX and Herceptin. past medical history significant for diabetes mellitus, anemia, hypertension, sleep apnea, status post appendectomy, peripheral vascular catheterization( Port placement) in September 2017 by Dr. Delana Meyer. He is also receiving  palliative radiation therapy and was seen by Dr. Donella Stade. 03/17/2016 -- - x-ray of the right ankle -- IMPRESSION: 1. Soft tissue wound noted over the lateral malleolus, no underlying bony abnormality. No acute bony abnormality. 2. Diffuse degenerative change. 3. Peripheral vascular disease. 05/05/2016 -- the patient's wife tells me that she has noticed a small superficial bruise on the left lateral ankle and wanted me to view this area. 05/12/2016 -- the patient is unable to afford Santyl ointment and we have been struggling over the last 8 weeks to use various alternatives. I have asked them today if they would be willing to use the snap vacuum system and she will let me know soon. 05/19/2016 -- the patient and the wife have declined the use of the snap the vacuum system. He does not want any debridement to be done today. His proteins have improved a bit. 06/08/2016 -- . Not seen the patient back for 3 weeks and in the meanwhile the wife has decided to use duoderm on the wound,-- given to her by a friend. The wound is macerated and has increased in size due to the moisture. 06/16/2016 -- besides the original wound he had on his right lateral malleolus he has a small area on the dorsum of his right foot which is possibly cause due to pressure. He now has a plantar wound on the first metatarsal head on the right foot where his podiatrist Dr. Vickki Muff had debrided a callus yesterday. Electronic Signature(s) Signed: 07/14/2016 12:03:07 PM By: Christin Fudge MD, FACS Entered By: Christin Fudge on 07/14/2016 12:03:07 [name] ([name] ([id number]) -------------------------------------------------------------------------------- Physical Exam Details Patient Name: [name], [name] Date of Service: 07/14/2016 11:15 AM Medical Record Number: [id number] Patient Account Number: [id number] Date of Birth/Sex: 27-Jan-1941 (76 y.o. Male) Treating RN: Baruch Gouty, RN, BSN, Velva Harman Primary Care  Provider: Enid Derry Other Clinician: Referring Provider: Enid Derry Treating Provider/Extender: Frann Rider in Treatment: 18 Constitutional . Pulse regular. Respirations normal and unlabored. Afebrile. . Eyes Nonicteric. Reactive to light. Ears, Nose, Mouth, and Throat Lips, teeth, and gums WNL.Marland Kitchen Moist mucosa without lesions. Neck supple and nontender. No palpable supraclavicular or cervical adenopathy. Normal sized without goiter. Respiratory WNL. No retractions.. Breath sounds WNL, No rubs, rales, rhonchi, or wheeze.. Cardiovascular Heart rhythm and rate regular, no murmur or gallop.. Pedal Pulses WNL. No clubbing, cyanosis or edema. Chest Breasts symmetical and no nipple discharge.. Breast tissue WNL, no masses, lumps, or  tenderness.. Lymphatic No adneopathy. No adenopathy. No adenopathy. Musculoskeletal Adexa without tenderness or enlargement.. Digits and nails w/o clubbing, cyanosis, infection, petechiae, ischemia, or inflammatory conditions.. Integumentary (Hair, Skin) No suspicious lesions. No crepitus or fluctuance. No peri-wound warmth or erythema. No masses.Marland Kitchen Psychiatric Judgement and insight Intact.. No evidence of depression, anxiety, or agitation.. Notes the right lateral ankle wound needed some sharp debridement with a #3 curet and so did the right plantar foot wound which was sharply debrided and minimal bleeding controlled with pressure Electronic Signature(s) Signed: 07/14/2016 12:03:35 PM By: Christin Fudge MD, FACS Entered By: Christin Fudge on 07/14/2016 12:03:34 [name] ([id number]) -------------------------------------------------------------------------------- Physician Orders Details Patient Name: [name] Date of Service: 07/14/2016 11:15 AM Medical Record Number: [id number] Patient Account Number: [id number] Date of Birth/Sex: 09/23/1940 (76 y.o. Male) Treating RN: Baruch Gouty, RN, BSN, Velva Harman Primary Care Provider: Enid Derry Other Clinician: Referring Provider: Enid Derry Treating Provider/Extender: Frann Rider in Treatment: 26 Verbal / Phone Orders: No Diagnosis Coding Wound Cleansing Wound #1 Right,Lateral Malleolus o Clean wound with Normal Saline. o Cleanse wound with mild soap and water Wound #3 Right,Plantar Foot o Clean wound with Normal Saline. o Cleanse wound with mild soap and water Anesthetic Wound #1 Right,Lateral Malleolus o Topical Lidocaine 4% cream applied to wound bed prior to debridement Wound #3 Right,Plantar Foot o Topical Lidocaine 4% cream applied to wound bed prior to debridement Skin Barriers/Peri-Wound Care Wound #1 Right,Lateral Malleolus o Antifungal cream - LOTRISONSE.Marland Kitchenapply around wound on reddened areas as needed Primary Wound Dressing Wound #1 Right,Lateral Malleolus o Hydrogel - similar to KY jelly over the counter o Cutimed Sorbact - cut small piece and insert in wound Wound #3 Right,Plantar Foot o Aquacel Ag Secondary Dressing Wound #1 Right,Lateral Malleolus o Dry Gauze o Boardered Foam Dressing Wound #3 Right,Plantar Foot o Dry Gauze o Boardered Foam Dressing [name], [name] ([id number]) o Drawtex Dressing Change Frequency Wound #1 Right,Lateral Malleolus o Change dressing every day. Follow-up Appointments Wound #1 Right,Lateral Malleolus o Return Appointment in 1 week. Edema Control Wound #1 Right,Lateral Malleolus o Elevate legs to the level of the heart and pump ankles as often as possible Off-Loading Wound #1 Right,Lateral Malleolus o Other: - Keep pressure off the malleolus Wound #3 Right,Plantar Foot o Other: - Keep pressure off the plantar foot Additional Orders / Instructions Wound #1 Right,Lateral Malleolus o Increase protein intake. o Activity as tolerated Medications-please add to medication list. Wound #1 Right,Lateral Malleolus o Other: - Include these in your  diet...VITAMIN A, C, ZINC, MVI Electronic Signature(s) Signed: 07/14/2016 4:12:51 PM By: Christin Fudge MD, FACS Signed: 07/14/2016 4:25:17 PM By: Regan Lemming BSN, RN Entered By: Regan Lemming on 07/14/2016 11:38:41 [name] ([id number]) -------------------------------------------------------------------------------- Problem List Details Patient Name: [name], [name] Date of Service: 07/14/2016 11:15 AM Medical Record Number: [id number] Patient Account Number: [id number] Date of Birth/Sex: [birth year] (76 y.o. Male) Treating RN: Baruch Gouty, RN, BSN, Velva Harman Primary Care Provider: Enid Derry Other Clinician: Referring Provider: Enid Derry Treating Provider/Extender: Frann Rider in Treatment: 28 Active Problems ICD-10 Encounter Code Description Active Date Diagnosis E11.621 Type 2 diabetes mellitus with foot ulcer 05/05/2016 Yes L89.512 Pressure ulcer of right ankle, stage 2 05/05/2016 Yes L89.521 Pressure ulcer of left ankle, stage 1 05/05/2016 Yes 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 05/05/2016 Yes Inactive Problems Resolved Problems Electronic Signature(s) Signed: 07/14/2016 12:02:11 PM By: Christin Fudge MD, FACS Entered By: Christin Fudge on 07/14/2016 12:02:10 [name] ([id number]) -------------------------------------------------------------------------------- Progress Note Details Patient Name:  [name] Date of Service: 07/14/2016 11:15 AM Medical Record Number: [id number] Patient Account Number: [id number] Date of Birth/Sex: 14-Oct-1940 (76 y.o. Male) Treating RN: Baruch Gouty, RN, BSN, Velva Harman Primary Care Provider: Enid Derry Other Clinician: Referring Provider: Enid Derry Treating Provider/Extender: Frann Rider in Treatment: 18 Subjective Chief Complaint Information obtained from Patient Patient is at the clinic for treatment of an open pressure ulcer to the right lateral ankle which she's had for about 2 months History of Present Illness  (HPI) The following HPI elements were documented for the patient's wound: Location: Patient presents with an ulcer on the right lateral ankle. Quality: Patient reports experiencing a dull pain to affected area(s). Severity: Patient states wound are getting worse. Duration: Patient has had the wound for > 2 months prior to seeking treatment at the wound center Timing: Pain in wound is Intermittent (comes and goes Context: The wound appeared gradually over time Modifying Factors: Other treatment(s) tried include:local care as per the medical oncologist Associated Signs and Symptoms: Patient reports having:some pain and no discharge 76 year old patient has been referred by his medical oncologist Dr. Grayland Ormond, for a right lateral malleolus ulcer has been there for several weeks. 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 He is currently on chemotherapy with FOLFOX and Herceptin. past medical history significant for diabetes mellitus, anemia, hypertension, sleep apnea, status post appendectomy, peripheral vascular catheterization( Port placement) in September 2017 by Dr. Delana Meyer. He is also receiving palliative radiation therapy and was seen by Dr. Donella Stade. 03/17/2016 -- - x-ray of the right ankle -- IMPRESSION: 1. Soft tissue wound noted over the lateral malleolus, no underlying bony abnormality. No acute bony abnormality. 2. Diffuse degenerative change. 3. Peripheral vascular disease. 05/05/2016 -- the patient's wife tells me that she has noticed a small superficial bruise on the left lateral ankle and wanted me to view this area. 05/12/2016 -- the patient is unable to afford Santyl ointment and we have been struggling over the last 8 weeks to use various alternatives. I have asked them today if they would be willing to use the snap vacuum system and she will let me know soon. 05/19/2016 -- the patient and the wife have declined the use of  the snap the vacuum system. He does not want any debridement to be done today. His proteins have improved a bit. 06/08/2016 -- . Not seen the patient back for 3 weeks and in the meanwhile the wife has decided to use duoderm on the wound,-- given to her by a friend. The wound is macerated and has increased in size due to the moisture. [name], [name] ([id number]) 06/16/2016 -- besides the original wound he had on his right lateral malleolus he has a small area on the dorsum of his right foot which is possibly cause due to pressure. He now has a plantar wound on the first metatarsal head on the right foot where his podiatrist Dr. Vickki Muff had debrided a callus yesterday. Allergies sufa drugs, Cephalosporins, lisinopril, losartin, Keflex, amoxicillin, ampicillin, Septra, Bactrim Objective Constitutional Pulse regular. Respirations normal and unlabored. Afebrile. Vitals Time Taken: 11:23 AM, Height: 67 in, Weight: 136 lbs, BMI: 21.3, Temperature: 98.1 F, Pulse: 84 bpm, Respiratory Rate: 16 breaths/min, Blood Pressure: 137/61 mmHg. Eyes Nonicteric. Reactive to light. Ears, Nose, Mouth, and Throat Lips, teeth, and gums WNL.Marland Kitchen Moist mucosa without lesions. Neck supple and nontender. No palpable supraclavicular or cervical adenopathy. Normal sized without goiter. Respiratory WNL. No retractions.Marland Kitchen  Breath sounds WNL, No rubs, rales, rhonchi, or wheeze.. Cardiovascular Heart rhythm and rate regular, no murmur or gallop.. Pedal Pulses WNL. No clubbing, cyanosis or edema. Chest Breasts symmetical and no nipple discharge.. Breast tissue WNL, no masses, lumps, or tenderness.. Lymphatic No adneopathy. No adenopathy. No adenopathy. Musculoskeletal Adexa without tenderness or enlargement.. Digits and nails w/o clubbing, cyanosis, infection, petechiae, ischemia, or inflammatory conditions.Marland Kitchen Psychiatric Judgement and insight Intact.. No evidence of depression, anxiety, or agitation.[name], [name]  ([id number]) General Notes: the right lateral ankle wound needed some sharp debridement with a #3 curet and so did the right plantar foot wound which was sharply debrided and minimal bleeding controlled with pressure Integumentary (Hair, Skin) No suspicious lesions. No crepitus or fluctuance. No peri-wound warmth or erythema. No masses.. Wound #1 status is Open. Original cause of wound was Gradually Appeared. The wound is located on the Right,Lateral Malleolus. The wound measures 0.9cm length x 0.7cm width x 0.2cm depth; 0.495cm^2 area and 0.099cm^3 volume. There is Fat Layer (Subcutaneous Tissue) Exposed exposed. There is no tunneling or undermining noted. There is a medium amount of serous drainage noted. The wound margin is flat and intact. There is small (1-33%) pink, pale granulation within the wound bed. There is a large (67-100%) amount of necrotic tissue within the wound bed including Adherent Slough. The periwound skin appearance exhibited: Induration, Erythema. The periwound skin appearance did not exhibit: Dry/Scaly, Maceration. The surrounding wound skin color is noted with erythema which is circumferential. Periwound temperature was noted as No Abnormality. The periwound has tenderness on palpation. Wound #3 status is Open. Original cause of wound was Pressure Injury. The wound is located on the [place]. The wound measures 0.7cm length x 0.7cm width x 0.1cm depth; 0.385cm^2 area and 0.038cm^3 volume. There is Fat Layer (Subcutaneous Tissue) Exposed exposed. There is no tunneling or undermining noted. There is a large amount of serous drainage noted. The wound margin is distinct with the outline attached to the wound base. There is medium (34-66%) pink, pale granulation within the wound bed. There is a medium (34-66%) amount of necrotic tissue within the wound bed including Adherent Slough. The periwound skin appearance exhibited: Callus, Maceration. The periwound skin  appearance did not exhibit: Crepitus, Excoriation, Induration, Rash, Scarring, Dry/Scaly, Atrophie Blanche, Cyanosis, Ecchymosis, Hemosiderin Staining, Mottled, Pallor, Rubor, Erythema. Periwound temperature was noted as No Abnormality. The periwound has tenderness on palpation. Assessment Active Problems ICD-10 E11.621 - Type 2 diabetes mellitus with foot ulcer L89.512 - Pressure ulcer of right ankle, stage 2 L89.521 - Pressure ulcer of left ankle, stage 1 E44.1 - Mild protein-calorie malnutrition Procedures Wound #1 Pre-procedure diagnosis of Wound #1 is a Pressure Ulcer located on the Right,Lateral Malleolus .Severity [name], [name] ([id number]) of Tissue Pre Debridement is: Fat layer exposed. There was a Skin/Subcutaneous Tissue Debridement ([id number]) debridement with total area of 0.63 sq cm performed by Christin Fudge, MD. with the following instrument(s): Curette to remove Non-Viable tissue/material including Fat Layer (and Subcutaneous Tissue) Exposed, Fibrin/Slough, and Subcutaneous after achieving pain control using Lidocaine 4% Topical Solution. A time out was conducted at 11:39, prior to the start of the procedure. A Minimum amount of bleeding was controlled with Pressure. The procedure was tolerated well with a pain level of 0 throughout and a pain level of 0 following the procedure. Post Debridement Measurements: 0.9cm length x 0.7cm width x 0.2cm depth; 0.099cm^3 volume. Post debridement Stage noted as Category/Stage III. Character of Wound/Ulcer Post Debridement is stable. Severity of  Tissue Post Debridement is: Fat layer exposed. Post procedure Diagnosis Wound #1: Same as Pre-Procedure Wound #3 Pre-procedure diagnosis of Wound #3 is a Diabetic Wound/Ulcer of the Lower Extremity located on the Right,Plantar Foot .Severity of Tissue Pre Debridement is: Fat layer exposed. There was a Skin/Subcutaneous Tissue Debridement ([id number]) debridement with total area of 0.49  sq cm performed by Christin Fudge, MD. with the following instrument(s): Curette to remove Non-Viable tissue/material including Fat Layer (and Subcutaneous Tissue) Exposed, Fibrin/Slough, and Subcutaneous after achieving pain control using Lidocaine 4% Topical Solution. A time out was conducted at 11:37, prior to the start of the procedure. A Minimum amount of bleeding was controlled with Pressure. The procedure was tolerated well with a pain level of 0 throughout and a pain level of 0 following the procedure. Post Debridement Measurements: 0.7cm length x 0.7cm width x 0.1cm depth; 0.038cm^3 volume. Character of Wound/Ulcer Post Debridement is stable. Severity of Tissue Post Debridement is: Fat layer exposed. Post procedure Diagnosis Wound #3: Same as Pre-Procedure Plan Wound Cleansing: Wound #1 Right,Lateral Malleolus: Clean wound with Normal Saline. Cleanse wound with mild soap and water Wound #3 Right,Plantar Foot: Clean wound with Normal Saline. Cleanse wound with mild soap and water Anesthetic: Wound #1 Right,Lateral Malleolus: Topical Lidocaine 4% cream applied to wound bed prior to debridement Wound #3 Right,Plantar Foot: Topical Lidocaine 4% cream applied to wound bed prior to debridement Skin Barriers/Peri-Wound Care: Wound #1 Right,Lateral Malleolus: Antifungal cream - LOTRISONSE.Marland Kitchenapply around wound on reddened areas as needed Primary Wound Dressing: [name], [name] ([id number]) Wound #1 Right,Lateral Malleolus: Hydrogel - similar to [place] jelly over the counter Cutimed Sorbact - cut small piece and insert in wound Wound #3 Right,Plantar Foot: Aquacel Ag Secondary Dressing: Wound #1 Right,Lateral Malleolus: Dry Gauze Boardered Foam Dressing Wound #3 Right,Plantar Foot: Dry Gauze Boardered Foam Dressing Drawtex Dressing Change Frequency: Wound #1 Right,Lateral Malleolus: Change dressing every day. Follow-up Appointments: Wound #1 Right,Lateral Malleolus: Return  Appointment in 1 week. Edema Control: Wound #1 Right,Lateral Malleolus: Elevate legs to the level of the heart and pump ankles as often as possible Off-Loading: Wound #1 Right,Lateral Malleolus: Other: - Keep pressure off the malleolus Wound #3 Right,Plantar Foot: Other: - Keep pressure off the plantar foot Additional Orders / Instructions: Wound #1 Right,Lateral Malleolus: Increase protein intake. Activity as tolerated Medications-please add to medication list.: Wound #1 Right,Lateral Malleolus: Other: - Include these in your diet...VITAMIN A, C, ZINC, MVI After sharp debridement today I have recommended: 1. Sorbact with hydrogel will be applied to his right ankle and 2. silver alginate with an offloading felt to the plantar aspect of his right foot. The patient is battling with metastatic esophageal cancer and he is undergoing chemotherapy at the present time. He will come back and see as next week [name], [name] ([id number]) Electronic Signature(s) Signed: 07/14/2016 12:04:53 PM By: Christin Fudge MD, FACS Entered By: Christin Fudge on 07/14/2016 12:04:52 [name] ([id number]) -------------------------------------------------------------------------------- SuperBill Details Patient Name: [name] Date of Service: 07/14/2016 Medical Record Number: [id number] Patient Account Number: [id number] Date of Birth/Sex: 09-Apr-1940 (76 y.o. 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 Facility Procedures CPT4 Code: [id number] Description: 85027 - DEB SUBQ TISSUE  20 SQ CM/< ICD-10 Description Diagnosis  E11.621 Type 2 diabetes mellitus with foot ulcer L89.512 Pressure ulcer of right ankle, stage 2 L89.521 Pressure ulcer of left ankle, stage 1 E44.1 Mild protein-calorie  malnutrition Modifier: Quantity: 1 Physician Procedures CPT4 Code: 0034917 Description: 91505 - WC PHYS SUBQ TISS 20 SQ CM ICD-10 Description Diagnosis E11.621 Type 2 diabetes mellitus with foot ulcer L89.512 Pressure ulcer of right ankle, stage 2 L89.521 Pressure ulcer of left ankle, stage 1 E44.1 Mild protein-calorie  malnutrition Modifier: Quantity: 1 Electronic Signature(s) Signed: 07/14/2016 12:05:06 PM By: Christin Fudge MD, FACS Entered By: Christin Fudge on 07/14/2016 12:05:05

## 2016-07-17 DIAGNOSIS — D509 Iron deficiency anemia, unspecified: Secondary | ICD-10-CM | POA: Diagnosis not present

## 2016-07-17 DIAGNOSIS — C787 Secondary malignant neoplasm of liver and intrahepatic bile duct: Secondary | ICD-10-CM | POA: Diagnosis not present

## 2016-07-17 DIAGNOSIS — R634 Abnormal weight loss: Secondary | ICD-10-CM | POA: Diagnosis not present

## 2016-07-17 DIAGNOSIS — R1310 Dysphagia, unspecified: Secondary | ICD-10-CM | POA: Diagnosis not present

## 2016-07-17 DIAGNOSIS — C155 Malignant neoplasm of lower third of esophagus: Secondary | ICD-10-CM | POA: Diagnosis not present

## 2016-07-17 DIAGNOSIS — F0390 Unspecified dementia without behavioral disturbance: Secondary | ICD-10-CM | POA: Diagnosis not present

## 2016-07-18 DIAGNOSIS — D509 Iron deficiency anemia, unspecified: Secondary | ICD-10-CM | POA: Diagnosis not present

## 2016-07-18 DIAGNOSIS — R634 Abnormal weight loss: Secondary | ICD-10-CM | POA: Diagnosis not present

## 2016-07-18 DIAGNOSIS — R1310 Dysphagia, unspecified: Secondary | ICD-10-CM | POA: Diagnosis not present

## 2016-07-18 DIAGNOSIS — F0390 Unspecified dementia without behavioral disturbance: Secondary | ICD-10-CM | POA: Diagnosis not present

## 2016-07-18 DIAGNOSIS — C155 Malignant neoplasm of lower third of esophagus: Secondary | ICD-10-CM | POA: Diagnosis not present

## 2016-07-18 DIAGNOSIS — C787 Secondary malignant neoplasm of liver and intrahepatic bile duct: Secondary | ICD-10-CM | POA: Diagnosis not present

## 2016-07-20 DIAGNOSIS — R634 Abnormal weight loss: Secondary | ICD-10-CM | POA: Diagnosis not present

## 2016-07-20 DIAGNOSIS — F0390 Unspecified dementia without behavioral disturbance: Secondary | ICD-10-CM | POA: Diagnosis not present

## 2016-07-20 DIAGNOSIS — C155 Malignant neoplasm of lower third of esophagus: Secondary | ICD-10-CM | POA: Diagnosis not present

## 2016-07-20 DIAGNOSIS — R1310 Dysphagia, unspecified: Secondary | ICD-10-CM | POA: Diagnosis not present

## 2016-07-20 DIAGNOSIS — D509 Iron deficiency anemia, unspecified: Secondary | ICD-10-CM | POA: Diagnosis not present

## 2016-07-20 DIAGNOSIS — C787 Secondary malignant neoplasm of liver and intrahepatic bile duct: Secondary | ICD-10-CM | POA: Diagnosis not present

## 2016-07-21 ENCOUNTER — Encounter: Admitting: Surgery

## 2016-07-21 DIAGNOSIS — L89513 Pressure ulcer of right ankle, stage 3: Secondary | ICD-10-CM | POA: Diagnosis not present

## 2016-07-21 DIAGNOSIS — E11621 Type 2 diabetes mellitus with foot ulcer: Secondary | ICD-10-CM | POA: Diagnosis not present

## 2016-07-21 DIAGNOSIS — S91301A Unspecified open wound, right foot, initial encounter: Secondary | ICD-10-CM | POA: Diagnosis not present

## 2016-07-23 NOTE — Progress Notes (Signed)
JAGER, KOSKA ([id number]) Visit Report for 07/21/2016 Chief Complaint Document Details Patient Name: [name], [name]. Date of Service: 07/21/2016 11:15 AM Medical Record Number: [id number] Patient Account Number: [id number] Date of Birth/Sex: Jan 18, 1941 (76 y.o. Male) Treating RN: Baruch Gouty, RN, BSN, Velva Harman Primary Care Provider: Enid Derry Other Clinician: Referring Provider: Enid Derry Treating Provider/Extender: Frann Rider in Treatment: 21 Information Obtained from: Patient Chief Complaint Patient is at the clinic for treatment of an open pressure ulcer to the right lateral ankle which she's had for about 2 months Electronic Signature(s) Signed: 07/21/2016 11:36:25 AM By: Christin Fudge MD, FACS Entered By: Christin Fudge on 07/21/2016 11:36:24 [name] ([id number]) -------------------------------------------------------------------------------- HPI Details Patient Name: [name] Date of Service: 07/21/2016 11:15 AM Medical Record Number: [id number] Patient Account Number: [id number] Date of Birth/Sex: 02/26/1940 (76 y.o. Male) Treating RN: Baruch Gouty, RN, BSN, Velva Harman Primary Care Provider: Enid Derry Other Clinician: Referring Provider: Enid Derry Treating Provider/Extender: Frann Rider in Treatment: 19 History of Present Illness Location: Patient presents with an ulcer on the right lateral ankle. Quality: Patient reports experiencing a dull pain to affected area(s). Severity: Patient states wound are getting worse. Duration: Patient has had the wound for > 2 months prior to seeking treatment at the wound center Timing: Pain in wound is Intermittent (comes and goes Context: The wound appeared gradually over time Modifying Factors: Other treatment(s) tried include:local care as per the medical oncologist Associated Signs and Symptoms: Patient reports having:some pain and no discharge HPI Description: 76 year old patient has been referred by his medical  oncologist Dr. Grayland Ormond, for a right lateral malleolus ulcer has been there for several weeks. 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 He is currently on chemotherapy with FOLFOX and Herceptin. past medical history significant for diabetes mellitus, anemia, hypertension, sleep apnea, status post appendectomy, peripheral vascular catheterization( Port placement) in September 2017 by Dr. Delana Meyer. He is also receiving palliative radiation therapy and was seen by Dr. Donella Stade. 03/17/2016 -- - x-ray of the right ankle -- IMPRESSION: 1. Soft tissue wound noted over the lateral malleolus, no underlying bony abnormality. No acute bony abnormality. 2. Diffuse degenerative change. 3. Peripheral vascular disease. 05/05/2016 -- the patient's wife tells me that she has noticed a small superficial bruise on the left lateral ankle and wanted me to view this area. 05/12/2016 -- the patient is unable to afford Santyl ointment and we have been struggling over the last 8 weeks to use various alternatives. I have asked them today if they would be willing to use the snap vacuum system and she will let me know soon. 05/19/2016 -- the patient and the wife have declined the use of the snap the vacuum system. He does not want any debridement to be done today. His proteins have improved a bit. 06/08/2016 -- . Not seen the patient back for 3 weeks and in the meanwhile the wife has decided to use duoderm on the wound,-- given to her by a friend. The wound is macerated and has increased in size due to the moisture. 06/16/2016 -- besides the original wound he had on his right lateral malleolus he has a small area on the dorsum of his right foot which is possibly cause due to pressure. He now has a plantar wound on the first metatarsal head on the right foot where his podiatrist Dr. Vickki Muff had debrided a callus yesterday. Electronic Signature(s) Signed: 07/21/2016  11:36:40 AM By: Con Memos,  Roderick Pee MD, FACS Entered By: Christin Fudge on 07/21/2016 11:36:40 [name] ([name] ([id number]) -------------------------------------------------------------------------------- Physical Exam Details Patient Name: [name], [name] Date of Service: 07/21/2016 11:15 AM Medical Record Number: [id number] Patient Account Number: [id number] Date of Birth/Sex: [birth year] (76 y.o. Male) Treating RN: Baruch Gouty, RN, BSN, Velva Harman Primary Care Provider: Enid Derry Other Clinician: Referring Provider: Enid Derry Treating Provider/Extender: Frann Rider in Treatment: 19 Constitutional . Pulse regular. Respirations normal and unlabored. Afebrile. . Eyes Nonicteric. Reactive to light. Ears, Nose, Mouth, and Throat Lips, teeth, and gums WNL.Marland Kitchen Moist mucosa without lesions. Neck supple and nontender. No palpable supraclavicular or cervical adenopathy. Normal sized without goiter. Respiratory WNL. No retractions.. Breath sounds WNL, No rubs, rales, rhonchi, or wheeze.. Cardiovascular Heart rhythm and rate regular, no murmur or gallop.. Pedal Pulses WNL. No clubbing, cyanosis or edema. Lymphatic No adneopathy. No adenopathy. No adenopathy. Musculoskeletal Adexa without tenderness or enlargement.. Digits and nails w/o clubbing, cyanosis, infection, petechiae, ischemia, or inflammatory conditions.. Integumentary (Hair, Skin) No suspicious lesions. No crepitus or fluctuance. No peri-wound warmth or erythema. No masses.Marland Kitchen Psychiatric Judgement and insight Intact.. No evidence of depression, anxiety, or agitation.. Notes the wound on the right ankle is looking much better and no sharp debridement was required today. There is a bit of granulation tissue at the base. The right plantar foot is also filled in nicely and no sharp debridement was required today. Electronic Signature(s) Signed: 07/21/2016 11:37:10 AM By: Christin Fudge MD, FACS Entered By:  Christin Fudge on 07/21/2016 11:37:09 [name] ([id number]) -------------------------------------------------------------------------------- Physician Orders Details Patient Name: [name], [name]. Date of Service: 07/21/2016 11:15 AM Medical Record Number: [id number] Patient Account Number: [id number] Date of Birth/Sex: 1940-08-31 (76 y.o. Male) Treating RN: Baruch Gouty, RN, BSN, Velva Harman Primary Care Provider: Enid Derry Other Clinician: Referring Provider: Enid Derry Treating Provider/Extender: Frann Rider in Treatment: 37 Verbal / Phone Orders: No Diagnosis Coding Wound Cleansing Wound #1 Right,Lateral Malleolus o Clean wound with Normal Saline. o Cleanse wound with mild soap and water Wound #3 Right,Plantar Foot o Clean wound with Normal Saline. o Cleanse wound with mild soap and water Anesthetic Wound #1 Right,Lateral Malleolus o Topical Lidocaine 4% cream applied to wound bed prior to debridement Wound #3 Right,Plantar Foot o Topical Lidocaine 4% cream applied to wound bed prior to debridement Skin Barriers/Peri-Wound Care Wound #1 Right,Lateral Malleolus o Antifungal cream - LOTRISONSE.Marland Kitchenapply around wound on reddened areas as needed Primary Wound Dressing Wound #1 Right,Lateral Malleolus o Hydrogel - similar to KY jelly over the counter o Cutimed Sorbact - cut small piece and insert in wound Wound #3 Right,Plantar Foot o Aquacel Ag Secondary Dressing Wound #1 Right,Lateral Malleolus o Dry Gauze o Boardered Foam Dressing Wound #3 Right,Plantar Foot o Dry Gauze o Boardered Foam Dressing [name], [name] ([id number]) o Drawtex Dressing Change Frequency Wound #1 Right,Lateral Malleolus o Change dressing every day. Follow-up Appointments Wound #1 Right,Lateral Malleolus o Return Appointment in 1 week. Edema Control Wound #1 Right,Lateral Malleolus o Elevate legs to the level of the heart and pump ankles as often as  possible Off-Loading Wound #1 Right,Lateral Malleolus o Other: - Keep pressure off the malleolus Wound #3 Right,Plantar Foot o Other: - Keep pressure off the plantar foot Additional Orders / Instructions Wound #1 Right,Lateral Malleolus o Increase protein intake. o Activity as tolerated Medications-please add to medication list. Wound #1 Right,Lateral Malleolus o Other: - Include these in your diet...VITAMIN A, C, ZINC, MVI Electronic Signature(s) Signed: 07/21/2016 4:12:58  PM By: Christin Fudge MD, FACS Signed: 07/21/2016 4:19:25 PM By: Regan Lemming BSN, RN Entered By: Regan Lemming on 07/21/2016 11:30:21 [name] ([id number]) -------------------------------------------------------------------------------- Problem List Details Patient Name: [name], [name] Date of Service: 07/21/2016 11:15 AM Medical Record Number: [id number] Patient Account Number: [id number] Date of Birth/Sex: [birth year] (76 y.o. 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  Male) Treating RN: Baruch Gouty, RN, BSN, Velva Harman Primary Care Provider: Enid Derry Other Clinician: Referring Provider: Enid Derry Treating Provider/Extender: Frann Rider in Treatment: 58 Subjective Chief Complaint Information obtained from Patient Patient is at the clinic for treatment of an open pressure ulcer to the right lateral ankle which she's had for about 2 months History of Present Illness (HPI) The following HPI elements were documented for the patient's wound: Location: Patient presents with an ulcer on the right lateral ankle. Quality: Patient reports experiencing a dull pain to affected area(s). Severity: Patient states wound are getting worse. Duration: Patient has had the wound for > 2 months prior to seeking treatment at the wound center Timing: Pain in wound is Intermittent (comes and goes Context: The wound appeared gradually over time Modifying Factors: Other treatment(s) tried include:local care as per the medical oncologist Associated Signs and Symptoms: Patient reports having:some pain and no discharge 76 year old patient has been referred by his medical oncologist Dr. Grayland Ormond, for a right lateral malleolus ulcer has been there for several weeks. 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 He is currently on chemotherapy with FOLFOX and Herceptin. past medical history significant for diabetes mellitus, anemia, hypertension, sleep apnea, status post appendectomy, peripheral vascular catheterization( Port placement) in September 2017 by Dr. Delana Meyer. He is also receiving palliative radiation therapy and was seen by Dr. Donella Stade. 03/17/2016 -- - x-ray of the right ankle -- IMPRESSION: 1. Soft tissue wound noted over the lateral malleolus, no underlying bony abnormality. No acute bony abnormality. 2. Diffuse degenerative change. 3. Peripheral vascular disease. 05/05/2016 -- the patient's wife tells me that she  has noticed a small superficial bruise on the left lateral ankle and wanted me to view this area. 05/12/2016 -- the patient is unable to afford Santyl ointment and we have been struggling over the last 8 weeks to use various alternatives. I have asked them today if they would be willing to use the snap vacuum system and she will let me know soon. 05/19/2016 -- the patient and the wife have declined the use of the snap the vacuum system. He does not want any debridement to be done today. His proteins have improved a bit. 06/08/2016 -- . Not seen the patient back for 3 weeks and in the meanwhile the wife has decided to use duoderm on the wound,-- given to her by a friend.  The wound is macerated and has increased in size due to the moisture. [name], [name] ([id number]) 06/16/2016 -- besides the original wound he had on his right lateral malleolus he has a small area on the dorsum of his right foot which is possibly cause due to pressure. He now has a plantar wound on the first metatarsal head on the right foot where his podiatrist Dr. Vickki Muff had debrided a callus yesterday. Allergies sufa drugs, Cephalosporins, lisinopril, losartin, Keflex, amoxicillin, ampicillin, Septra, Bactrim Objective Constitutional Pulse regular. Respirations normal and unlabored. Afebrile. Vitals Time Taken: 11:20 AM, Height: 67 in, Weight: 136 lbs, BMI: 21.3, Temperature: 97.9 F, Pulse: 76 bpm, Respiratory Rate: 16 breaths/min, Blood Pressure: 131/57 mmHg. Eyes Nonicteric. Reactive to light. Ears, Nose, Mouth, and Throat Lips, teeth, and gums WNL.Marland Kitchen Moist mucosa without lesions. Neck supple and nontender. No palpable supraclavicular or cervical adenopathy. Normal sized without goiter. Respiratory WNL. No retractions.. Breath sounds WNL, No rubs, rales, rhonchi, or wheeze.. Cardiovascular Heart rhythm and rate regular, no murmur or gallop.. Pedal Pulses WNL. No clubbing, cyanosis or edema. Lymphatic No  adneopathy. No adenopathy. No adenopathy. Musculoskeletal Adexa without tenderness or enlargement.. Digits and nails w/o clubbing, cyanosis, infection, petechiae, ischemia, or inflammatory conditions.Marland Kitchen Psychiatric Judgement and insight Intact.. No evidence of depression, anxiety, or agitation.. General Notes: the wound on the right ankle is looking much better and no sharp debridement was required today. There is a bit of granulation tissue at the base. The right plantar foot is also filled in nicely and no [name], [name]. ([id number]) sharp debridement was required today. Integumentary (Hair, Skin) No suspicious lesions. No crepitus or fluctuance. No peri-wound warmth or erythema. No masses.. Wound #1 status is Open. Original cause of wound was Gradually Appeared. The wound is located on the Right,Lateral Malleolus. The wound measures 0.6cm length x 0.6cm width x 0.2cm depth; 0.283cm^2 area and 0.057cm^3 volume. There is Fat Layer (Subcutaneous Tissue) Exposed exposed. There is no tunneling or undermining noted. There is a medium amount of serous drainage noted. The wound margin is flat and intact. There is large (67-100%) pink, pale granulation within the wound bed. There is no necrotic tissue within the wound bed. The periwound skin appearance exhibited: Induration, Erythema. The periwound skin appearance did not exhibit: Dry/Scaly, Maceration. The surrounding wound skin color is noted with erythema which is circumferential. Periwound temperature was noted as No Abnormality. The periwound has tenderness on palpation. Wound #3 status is Open. Original cause of wound was Pressure Injury. The wound is located on the [place]. The wound measures 0.8cm length x 0.8cm width x 0.1cm depth; 0.503cm^2 area and 0.05cm^3 volume. There is Fat Layer (Subcutaneous Tissue) Exposed exposed. There is no tunneling or undermining noted. There is a large amount of serous drainage noted. The wound  margin is distinct with the outline attached to the wound base. There is medium (34-66%) pink, pale granulation within the wound bed. There is a medium (34-66%) amount of necrotic tissue within the wound bed including Adherent Slough. The periwound skin appearance exhibited: Callus, Maceration. The periwound skin appearance did not exhibit: Crepitus, Excoriation, Induration, Rash, Scarring, Dry/Scaly, Atrophie Blanche, Cyanosis, Ecchymosis, Hemosiderin Staining, Mottled, Pallor, Rubor, Erythema. Periwound temperature was noted as No Abnormality. The periwound has tenderness on palpation. Assessment Active Problems ICD-10 E11.621 - Type 2 diabetes mellitus with foot ulcer L89.512 - Pressure ulcer of right ankle, stage 2 L89.521 - Pressure ulcer of left ankle, stage 1 E44.1 - Mild protein-calorie malnutrition Plan Wound Cleansing:  Wound #1 Right,Lateral Malleolus: Clean wound with Normal Saline. Cleanse wound with mild soap and water [name], [name] ([id number]) Wound #3 Right,Plantar Foot: Clean wound with Normal Saline. Cleanse wound with mild soap and water Anesthetic: Wound #1 Right,Lateral Malleolus: Topical Lidocaine 4% cream applied to wound bed prior to debridement Wound #3 Right,Plantar Foot: Topical Lidocaine 4% cream applied to wound bed prior to debridement Skin Barriers/Peri-Wound Care: Wound #1 Right,Lateral Malleolus: Antifungal cream - LOTRISONSE.Marland Kitchenapply around wound on reddened areas as needed Primary Wound Dressing: Wound #1 Right,Lateral Malleolus: Hydrogel - similar to [place] jelly over the counter Cutimed Sorbact - cut small piece and insert in wound Wound #3 Right,Plantar Foot: Aquacel Ag Secondary Dressing: Wound #1 Right,Lateral Malleolus: Dry Gauze Boardered Foam Dressing Wound #3 Right,Plantar Foot: Dry Gauze Boardered Foam Dressing Drawtex Dressing Change Frequency: Wound #1 Right,Lateral Malleolus: Change dressing every day. Follow-up  Appointments: Wound #1 Right,Lateral Malleolus: Return Appointment in 1 week. Edema Control: Wound #1 Right,Lateral Malleolus: Elevate legs to the level of the heart and pump ankles as often as possible Off-Loading: Wound #1 Right,Lateral Malleolus: Other: - Keep pressure off the malleolus Wound #3 Right,Plantar Foot: Other: - Keep pressure off the plantar foot Additional Orders / Instructions: Wound #1 Right,Lateral Malleolus: Increase protein intake. Activity as tolerated Medications-please add to medication list.: Wound #1 Right,Lateral Malleolus: Other: - Include these in your diet...VITAMIN A, C, ZINC, MVI [name], [name]. ([id number]) There was good improvement this week and I have recommended: 1. Sorbact with hydrogel will be applied to his right ankle and 2. silver alginate with an offloading felt to the plantar aspect of his right foot. The patient is battling with metastatic esophageal cancer and he is not undergoing chemotherapy -- they have stopped it completely at the present time. He will come back and see as next week Electronic Signature(s) Signed: 07/21/2016 11:38:25 AM By: Christin Fudge MD, FACS Entered By: Christin Fudge on 07/21/2016 11:38:25 [name] ([id number]) -------------------------------------------------------------------------------- SuperBill Details Patient Name: [name] Date of Service: 07/21/2016 Medical Record Number: [id number] Patient Account Number: [id number] Date of Birth/Sex: Oct 25, 1940 (76 y.o. 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

## 2016-07-23 NOTE — Progress Notes (Signed)
ERLING, ARRAZOLA ([id number]) Visit Report for 07/21/2016 Allergy List Details Patient Name: KENDELL, SAGRAVES. Date of Service: 07/21/2016 11:15 AM Medical Record Number: [id number] Patient Account Number: [id number] Date of Birth/Sex: 20-Mar-1940 (76 y.o. Male) Treating RN: Baruch Gouty, RN, BSN, Velva Harman Primary Care Corianne Buccellato: Enid Derry Other Clinician: Referring Bassy Fetterly: En[place] Derry Treating Mayjor Ager/Extender: Frann Rider in Treatment: 19 Allergies Active Allergies sufa drugs Cephalosporins lisinopril losartin Keflex amoxicillin ampicillin Septra Bactrim Allergy Notes Electronic Signature(s) Signed: 07/21/2016 4:19:25 PM By: Regan Lemming BSN, RN Entered By: Regan Lemming on 07/21/2016 11:29:18 [name] ([id number]) -------------------------------------------------------------------------------- [place] Details Patient Name: [name] Date of Service: 07/21/2016 11:15 AM Medical Record Number: [id number] Patient Account Number: [id number] Date of Birth/Sex: 1940-06-22 (76 y.o. Male) Treating RN: Baruch Gouty, RN, BSN, [place] Primary Care Espn Zeman: Enid Derry Other Clinician: Referring Jiovanny Burdell: Enid Derry Treating Grove Defina/Extender: Frann Rider in Treatment: 36 Visit Information Patient Arrived: Ambulatory Arrival Time: 11:18 Accompanied By: wife Transfer Assistance: Manual Patient Identification Verified: Yes Secondary Verification Process Yes Completed: Patient Requires Transmission- No Based Precautions: Patient Has Alerts: Yes Patient Alerts: Patient on Blood Thinner DM II 81mg  aspirin twice daily History Since Last Visit All ordered tests and consults were completed: No Added or deleted any medications: No Any new allergies or adverse reactions: No Had a fall or experienced change in activities of daily living that may affect risk of falls: No Signs or symptoms of abuse/neglect since last visito No Hospitalized since last visit:  No Has Dressing in Place as Prescribed: Yes Electronic Signature(s) Signed: 07/21/2016 4:19:25 PM By: Regan Lemming BSN, RN Entered By: Regan Lemming on 07/21/2016 11:19:08 [name] ([id number]) -------------------------------------------------------------------------------- Clinic Level of Care Assessment Details Patient Name: [name] Date of Service: 07/21/2016 11:15 AM Medical Record Number: [id number] Patient Account Number: [id number] Date of Birth/Sex: 05-05-1940 (76 y.o. Male) Treating RN: Baruch Gouty, RN, BSN, [place] Primary Care Kanon Colunga: Enid Derry Other Clinician: Referring Jaqua Ching: Enid Derry Treating Beckem Tomberlin/Extender: Frann Rider in Treatment: 19 Clinic Level of Care Assessment Items TOOL 4 Quantity Score []  - Use when only an EandM is performed on FOLLOW-UP visit 0 ASSESSMENTS - Nursing Assessment / Reassessment X - Reassessment of Co-morbidities (includes updates in patient status) 1 10 X - Reassessment of Adherence to Treatment Plan 1 5 ASSESSMENTS - Wound and Skin Assessment / Reassessment []  - Simple Wound Assessment / Reassessment - one wound 0 X - Complex Wound Assessment / Reassessment - multiple wounds 2 5 []  - Dermatologic / Skin Assessment (not related to wound area) 0 ASSESSMENTS - Focused Assessment []  - Circumferential Edema Measurements - multi extremities 0 []  - Nutritional Assessment / Counseling / Intervention 0 X - Lower Extremity Assessment (monofilament, tuning fork, pulses) 1 5 []  - Peripheral Arterial Disease Assessment (using hand held doppler) 0 ASSESSMENTS - Ostomy and/or Continence Assessment and Care []  - Incontinence Assessment and Management 0 []  - Ostomy Care Assessment and Management (repouching, etc.) 0 PROCESS - Coordination of Care X - Simple Patient / Family Education for ongoing care 1 15 []  - Complex (extensive) Patient / Family Education for ongoing care 0 []  - Staff obtains Programmer, systems, Records, Test Results /  Process Orders 0 []  - Staff telephones HHA, Nursing Homes / Clarify orders / etc 0 []  - Routine Transfer to another Facility (non-emergent condition) 0 RYAN, OGBORN ([id number]) []  - Routine Hospital Admission (non-emergent condition) 0 []  - New Admissions / Insurance Authorizations / Ordering NPWT, Apligraf, etc. 0 []  - Emergency  Hospital Admission (emergent condition) 0 []  - Simple Discharge Coordination 0 []  - Complex (extensive) Discharge Coordination 0 PROCESS - Special Needs []  - Pediatric / Minor Patient Management 0 []  - Isolation Patient Management 0 []  - Hearing / Language / Visual special needs 0 []  - Assessment of Community assistance (transportation, D/C planning, etc.) 0 []  - Additional assistance / Altered mentation 0 []  - Support Surface(s) Assessment (bed, cushion, seat, etc.) 0 INTERVENTIONS - Wound Cleansing / Measurement []  - Simple Wound Cleansing - one wound 0 X - Complex Wound Cleansing - multiple wounds 2 5 X - Wound Imaging (photographs - any number of wounds) 1 5 []  - Wound Tracing (instead of photographs) 0 []  - Simple Wound Measurement - one wound 0 X - Complex Wound Measurement - multiple wounds 2 5 INTERVENTIONS - Wound Dressings []  - Small Wound Dressing one or multiple wounds 0 X - Medium Wound Dressing one or multiple wounds 2 15 []  - Large Wound Dressing one or multiple wounds 0 []  - Application of Medications - topical 0 []  - Application of Medications - injection 0 INTERVENTIONS - Miscellaneous []  - External ear exam 0 MORIO, WIDEN ([id number]) []  - Specimen Collection (cultures, biopsies, blood, body fluids, etc.) 0 []  - Specimen(s) / Culture(s) sent or taken to Lab for analysis 0 []  - Patient Transfer (multiple staff / Harrel Lemon Lift / Similar devices) 0 []  - Simple Staple / Suture removal (25 or less) 0 []  - Complex Staple / Suture removal (26 or more) 0 []  - Hypo / Hyperglycemic Management (close monitor of Blood Glucose) 0 []  - Ankle /  Brachial Index (ABI) - do not check if billed separately 0 X - Vital Signs 1 5 Has the patient been seen at the hospital within the last three years: Yes Total Score: 105 Level Of Care: New/Established - Level 3 Electronic Signature(s) Signed: 07/21/2016 4:19:25 PM By: Regan Lemming BSN, RN Entered By: Regan Lemming on 07/21/2016 11:31:09 [name] ([id number]) -------------------------------------------------------------------------------- Encounter Discharge Information Details Patient Name: [name] Date of Service: 07/21/2016 11:15 AM Medical Record Number: [id number] Patient Account Number: [id number] Date of Birth/Sex: 12-04-1940 (76 y.o. Male) Treating RN: Baruch Gouty, RN, BSN, Velva Harman Primary Care Tylar Merendino: Enid Derry Other Clinician: Referring Johnita Palleschi: [place] Derry Treating Rawson Minix/Extender: Frann Rider in Treatment: 24 Encounter Discharge Information Items Discharge Pain Level: 0 Discharge Condition: Stable Ambulatory Status: Ambulatory Discharge Destination: Home Transportation: Private Auto Accompanied By: wife Schedule Follow-up Appointment: No Medication Reconciliation completed and provided to Patient/Care No Roselina Burgueno: Provided on Clinical Summary of Care: 07/21/2016 Form Type Recipient Paper Patient FW Electronic Signature(s) Signed: 07/21/2016 11:43:43 AM By: Sharon Mt Entered By: Sharon Mt on 07/21/2016 11:43:42 [name] ([id number]) -------------------------------------------------------------------------------- Lower Extremity Assessment Details Patient Name: [name] Date of Service: 07/21/2016 11:15 AM Medical Record Number: [id number] Patient Account Number: [id number] Date of Birth/Sex: 27-Aug-1940 (76 y.o. Male) Treating RN: Baruch Gouty, RN, BSN, Velva Harman Primary Care Nikola Blackston: Enid Derry Other Clinician: Referring Graesyn Schreifels: En[place] Derry Treating Sharyah Bostwick/Extender: Frann Rider in Treatment: 32 Vascular  Assessment Claudication: Claudication Assessment [Right:None] Pulses: Dorsalis Pedis Palpable: [Right:Yes] Posterior Tibial Extremity colors, hair growth, and conditions: Extremity Color: [Right:Normal] Hair Growth on Extremity: [Right:Yes] Temperature of Extremity: [Right:Warm] Capillary Refill: [Right:< 3 seconds] Electronic Signature(s) Signed: 07/21/2016 4:19:25 PM By: Regan Lemming BSN, RN Entered By: Regan Lemming on 07/21/2016 11:20:44 [name] ([id number]) -------------------------------------------------------------------------------- Multi Wound Chart Details Patient Name: [name] Date of Service: 07/21/2016 11:15 AM Medical Record  Number: [id number] Patient Account Number: [id number] Date of Birth/Sex: 1940/12/19 (76 y.o. 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] [N/A:N/A] Secondary Etiology: [1:Diabetic Wound/Ulcer of the Lower Extremity] [3:N/A] [N/A:N/A] Comorbid History: [1:Cataracts, Chronic sinus problems/congestion, Anemia, Sleep Apnea, Hypertension, Myocardial Infarction, Type II Diabetes, Received Chemotherapy, Received Radiation] [3:Cataracts, Chronic sinus problems/congestion, Anemia, Sleep  Apnea, Hypertension, Myocardial Infarction, Type II Diabetes, Received Chemotherapy, Received Radiation] [N/A:N/A] Date Acquired: [1:01/09/2016] [3:06/15/2016]  [N/A:N/A] Weeks of Treatment: [1:19] [3:5] [N/A:N/A] Wound Status: [1:Open] [3:Open] [N/A:N/A] Measurements L x W x D 0.6x0.6x0.2 [3:0.8x0.8x0.1] [N/A:N/A] (cm) Area (cm) : [1:0.283] [3:0.503] [N/A:N/A] Volume (cm) : [1:0.057] [3:0.05] [N/A:N/A] % Reduction in Area: [1:-19.90%] [3:71.50%] [N/A:N/A] % Reduction in Volume: -137.50% [3:98.10%] [N/A:N/A] Classification: [1:Category/Stage III] [3:Grade 1] [N/A:N/A] HBO Classification: [1:Grade 1] [3:N/A] [N/A:N/A] Exudate Amount: [1:Medium] [3:Large] [N/A:N/A] Exudate Type: [1:Serous] [3:Serous] [N/A:N/A] Exudate Color: [1:amber] [3:amber] [N/A:N/A] Wound Margin: [1:Flat and Intact] [3:Distinct, outline attached] [N/A:N/A] Granulation Amount: Large (67-100%) Medium (34-66%) N/A Granulation Quality: Pink, Pale Pink, Pale N/A Necrotic Amount: None Present (0%) Medium (34-66%) N/A Exposed Structures: Fat Layer (Subcutaneous Fat Layer (Subcutaneous N/A Tissue) Exposed: Yes Tissue) Exposed: Yes Fascia: No Fascia: No Tendon: No Tendon: No Muscle: No Muscle: No Joint: No Joint: No Bone: No Bone: No Epithelialization: None None N/A Periwound Skin Texture: Induration: Yes Callus: Yes N/A Excoriation: No Induration: No Crepitus: No Rash: No Scarring: No Periwound Skin Maceration: No Maceration: Yes N/A Moisture: Dry/Scaly: No Dry/Scaly: No Periwound Skin Color: Erythema: Yes Atrophie Blanche: No N/A Cyanosis: No Ecchymosis: No Erythema: No Hemosiderin Staining: No Mottled: No Pallor: No Rubor: No Erythema Location: Circumferential N/A N/A Erythema Change: Decreased N/A N/A Temperature: No Abnormality No Abnormality N/A Tenderness on Yes Yes N/A Palpation: Wound Preparation: Ulcer Cleansing: Ulcer Cleansing: N/A Rinsed/Irrigated with Rinsed/Irrigated with Saline Saline Topical Anesthetic Topical Anesthetic Applied: Other: lidocaine Applied: Other: lidocaine 4% 4% Treatment Notes Wound #1 (Right, Lateral  Malleolus) 1. Cleansed with: Clean wound with Normal Saline 2. Anesthetic Topical Lidocaine 4% cream to wound bed prior to debridement 3. Peri-wound Care: Skin Prep 4. Dressing Applied: LOWRY, BALA ([id number]) Hydrogel 5. Secondary Dressing Applied Bordered Foam Dressing Dry Gauze Notes sorbact Wound #3 (Right, Plantar Foot) 1. Cleansed with: Clean wound with Normal Saline 2. Anesthetic Topical Lidocaine 4% cream to wound bed prior to debridement 3. Peri-wound Care: Skin Prep 4. Dressing Applied: Hydrogel 5. Secondary Dressing Applied Bordered Foam Dressing Dry Gauze Notes sorbact Electronic Signature(s) Signed: 07/21/2016 11:36:16 AM By: Christin Fudge MD, FACS Entered By: Christin Fudge on 07/21/2016 11:36:16 [name] ([id number]) -------------------------------------------------------------------------------- [place] Details Patient Name: KMARI, HALTER Date of Service: 07/21/2016 11:15 AM Medical Record Number: [id number] Patient Account Number: [id number] Date of Birth/Sex: 1940/04/02 (76 y.o. Male) Treating RN: Baruch Gouty, RN, BSN, Velva Harman Primary Care Noemi Bellissimo: Enid Derry Other Clinician: Referring Shunta Mclaurin: Enid Derry Treating Osha Rane/Extender: Frann Rider in Treatment: 45 Active Inactive ` Orientation to the Wound Care Program Nursing Diagnoses: Knowledge deficit related to the wound healing center program Goals: Patient/caregiver will verbalize understanding of the [place] Program  Date Initiated: 03/10/2016 Target Resolution Date: 03/23/2016 Goal Status: Active Interventions: Provide education on orientation to the wound center Notes: ` Soft Tissue Infection Nursing Diagnoses: Impaired tissue integrity Potential for infection: soft tissue Goals: Patient will remain free of wound infection Date Initiated: 03/10/2016 Target Resolution Date: 03/17/2016 Goal Status: Active Interventions: Assess signs  and symptoms of infection every visit Notes: ` Wound/Skin Impairment Nursing Diagnoses: Impaired tissue integrity OLLIVER, BOYADJIAN ([id number]) Goals: Ulcer/skin breakdown will heal within 14 weeks Date Initiated: 03/10/2016 Target Resolution Date: 03/24/2016 Goal Status: Active Interventions: Assess patient/caregiver ability to obtain necessary supplies Notes: Electronic Signature(s) Signed: 07/21/2016 4:19:25 PM By: Regan Lemming BSN, RN Entered By: Regan Lemming on 07/21/2016 11:25:48 [name] ([id number]) -------------------------------------------------------------------------------- Pain Assessment Details Patient Name: [name] Date of Service: 07/21/2016 11:15 AM Medical Record Number: [id number] Patient Account Number: [id number] Date of Birth/Sex: 07-Jan-1941 (76 y.o. Male) Treating RN: Baruch Gouty, RN, BSN, Velva Harman Primary Care Zohal Reny: Enid Derry Other Clinician: Referring Cabella Kimm: En[place] Derry Treating Chrishawna Farina/Extender: Frann Rider in Treatment: 42 Active Problems Location of Pain Severity and Description of Pain Patient Has Paino No Site Locations With Dressing Change: No Pain Management and Medication Current Pain Management: Electronic Signature(s) Signed: 07/21/2016 4:19:25 PM By: Regan Lemming BSN, RN Entered By: Regan Lemming on 07/21/2016 11:20:27 [name] ([id number]) -------------------------------------------------------------------------------- Patient/Caregiver Education Details Patient Name: [name] Date of Service: 07/21/2016 11:15 AM Medical Record Number: [id number] Patient Account Number: [id number] Date of Birth/Gender: April 26, 1940 (76 y.o. Male) Treating RN: Baruch Gouty, RN, BSN, Velva Harman Primary Care Physician: Enid Derry Other Clinician: Referring Physician: Enid Derry Treating Physician/Extender: Frann Rider in Treatment: 61 Education Assessment Education Provided To: Patient Education Topics Provided Welcome  To [facility]: Methods: Explain/Verbal Responses: State content correctly Wound/Skin Impairment: Methods: Explain/Verbal Responses: State content correctly Electronic Signature(s) Signed: 07/21/2016 4:19:25 PM By: Regan Lemming BSN, RN Entered By: Regan Lemming on 07/21/2016 11:43:09 [name] ([id number]) -------------------------------------------------------------------------------- Wound Assessment Details Patient Name: [name] Date of Service: 07/21/2016 11:15 AM Medical Record Number: [id number] Patient Account Number: [id number] Date of Birth/Sex: November 14, 1940 (76 y.o. Male) Treating RN: Baruch Gouty, RN, BSN, [place] Primary Care Emmalou Hunger: Enid Derry Other Clinician: Referring Itay Mella: En[place] Derry Treating Jade Burkard/Extender: Frann Rider in Treatment: 19 Wound Status Wound Number: 1 Primary Pressure Ulcer Etiology: Wound Location: Right Malleolus - Lateral Secondary Diabetic Wound/Ulcer of the Lower Wounding Event: Gradually Appeared Etiology: Extremity Date Acquired: 01/09/2016 Wound Open Weeks Of Treatment: 19 Status: Clustered Wound: No Comorbid Cataracts, Chronic sinus History: problems/congestion, Anemia, Sleep Apnea, Hypertension, Myocardial Infarction, Type II Diabetes, Received Chemotherapy, Received Radiation Photos Photo Uploaded By: Regan Lemming on 07/21/2016 13:56:33 Wound Measurements Length: (cm) 0.6 Width: (cm) 0.6 Depth: (cm) 0.2 Area: (cm) 0.283 Volume: (cm) 0.057 % Reduction in Area: -19.9% % Reduction in Volume: -137.5% Epithelialization: None Tunneling: No Undermining: No Wound Description 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 Severity (Wagner): Grade 1 Slough/Fibrin Wound Margin: Flat and Intact Exudate Amount: Medium Exudate Type: Serous Exudate Color: amber TRENTON, PASSOW ([id number]) er Cleansing: No o Yes Wound Bed Granulation Amount: Large (67-100%) Exposed Structure Granulation  Quality: Pink, Pale Fascia Exposed: No Necrotic Amount: None Present (0%) Fat Layer (Subcutaneous Tissue) Exposed: Yes Tendon Exposed: No Muscle Exposed: No Joint Exposed: No Bone Exposed: No Periwound Skin Texture Texture Color No Abnormalities Noted: No No Abnormalities Noted: No Induration: Yes Erythema: Yes Erythema Location: Circumferential Moisture Erythema Change: Decreased No Abnormalities Noted: No Dry / Scaly: No Temperature /  Pain Maceration: No Temperature: No Abnormality Tenderness on Palpation: Yes Wound Preparation Ulcer Cleansing: Rinsed/Irrigated with Saline Topical Anesthetic Applied: Other: lidocaine 4%, Treatment Notes Wound #1 (Right, Lateral Malleolus) 1. Cleansed with: Clean wound with Normal Saline 2. Anesthetic Topical Lidocaine 4% cream to wound bed prior to debridement 3. Peri-wound Care: Skin Prep 4. Dressing Applied: Hydrogel 5. Secondary Dressing Applied Bordered Foam Dressing Dry Gauze Notes sorbact Electronic Signature(s) Signed: 07/21/2016 4:19:25 PM By: Regan Lemming BSN, RN Entered By: Regan Lemming on 07/21/2016 11:25:06 [name] ([id number]) -------------------------------------------------------------------------------- Wound Assessment Details Patient Name: [name] Date of Service: 07/21/2016 11:15 AM Medical Record Number: [id number] Patient Account Number: [id number] Date of Birth/Sex: [birth year] (76 y.o. Male) Treating RN: Baruch Gouty, RN, BSN, [place] Primary Care Sharlot Sturkey: En[place] Derry Other Clinician: Referring Briana Newman: [place] Derry Treating Deondrae Mcgrail/Extender: Frann Rider in Treatment: 19 Wound Status Wound Number: 3 Primary Diabetic Wound/Ulcer of the Lower Etiology: Extremity Wound Location: Right Foot - Plantar Wound Open Wounding Event: Pressure Injury Status: Date Acquired: 06/15/2016 Comorbid Cataracts, Chronic sinus Weeks Of Treatment: 5 History: problems/congestion, Anemia, Sleep Clustered  Wound: No Apnea, Hypertension, Myocardial Infarction, Type II Diabetes, Received Chemotherapy, Received Radiation Photos Photo Uploaded By: Regan Lemming on 07/21/2016 13:56:34 Wound Measurements Length: (cm) 0.8 Width: (cm) 0.8 Depth: (cm) 0.1 Area: (cm) 0.503 Volume: (cm) 0.05 % Reduction in Area: 71.5% % Reduction in Volume: 98.1% Epithelialization: None Tunneling: No Undermining: No Wound Description Classification: Grade 1 Foul Odor Aft Wound Margin: Distinct, outline attached Slough/Fibrin Exudate Amount: Large Exudate Type: Serous Exudate Color: amber er Cleansing: No o Yes Wound Bed Granulation Amount: Medium (34-66%) Exposed Structure Granulation Quality: Pink, Pale Fascia Exposed: No OCTAVIUS, SHIN ([id number]) Necrotic Amount: Medium (34-66%) Fat Layer (Subcutaneous Tissue) Exposed: Yes Necrotic Quality: Adherent Slough Tendon Exposed: No Muscle Exposed: No Joint Exposed: No Bone Exposed: No Periwound Skin Texture Texture Color No Abnormalities Noted: No No Abnormalities Noted: No Callus: Yes Atrophie Blanche: No Crepitus: No Cyanosis: No Excoriation: No Ecchymosis: No Induration: No Erythema: No Rash: No Hemosiderin Staining: No Scarring: No Mottled: No Pallor: No Moisture Rubor: No No Abnormalities Noted: No Dry / Scaly: No Temperature / Pain Maceration: Yes Temperature: No Abnormality Tenderness on Palpation: Yes Wound Preparation Ulcer Cleansing: Rinsed/Irrigated with Saline Topical Anesthetic Applied: Other: lidocaine 4%, Treatment Notes Wound #3 (Right, Plantar Foot) 1. Cleansed with: Clean wound with Normal Saline 2. Anesthetic Topical Lidocaine 4% cream to wound bed prior to debridement 3. Peri-wound Care: Skin Prep 4. Dressing Applied: Hydrogel 5. Secondary Dressing Applied Bordered Foam Dressing Dry Gauze Notes sorbact Electronic Signature(s) Signed: 07/21/2016 4:19:25 PM By: Regan Lemming BSN, RN Entered By: Regan Lemming on 07/21/2016 11:25:19 [name] ([id number]) -------------------------------------------------------------------------------- [place] Details Patient Name: [name] Date of Service: 07/21/2016 11:15 AM Medical Record Number: [id number] Patient Account Number: [id number] Date of Birth/Sex: 1940/06/05 (76 y.o. Male) Treating RN: Baruch Gouty, RN, BSN, [place] Primary Care Junior Kenedy: Enid Derry Other Clinician: Referring Jacoya Bauman: En[place] Derry Treating Deundra Bard/Extender: Frann Rider in Treatment: 19 Vital Signs Time Taken: 11:20 Temperature (F): 97.9 Height (in): 67 Pulse (bpm): 76 Weight (lbs): 136 Respiratory Rate (breaths/min): 16 Body Mass Index (BMI): 21.3 Blood Pressure (mmHg): 131/57 Reference Range: 80 - 120 mg / dl Electronic Signature(s) Signed: 07/21/2016 4:19:25 PM By: Regan Lemming BSN, RN Entered By: Regan Lemming on 07/21/2016 11:21:03

## 2016-07-26 DIAGNOSIS — H16142 Punctate keratitis, left eye: Secondary | ICD-10-CM | POA: Diagnosis not present

## 2016-07-26 DIAGNOSIS — F0390 Unspecified dementia without behavioral disturbance: Secondary | ICD-10-CM | POA: Diagnosis not present

## 2016-07-26 DIAGNOSIS — E113293 Type 2 diabetes mellitus with mild nonproliferative diabetic retinopathy without macular edema, bilateral: Secondary | ICD-10-CM | POA: Diagnosis not present

## 2016-07-26 DIAGNOSIS — C155 Malignant neoplasm of lower third of esophagus: Secondary | ICD-10-CM | POA: Diagnosis not present

## 2016-07-26 DIAGNOSIS — R1310 Dysphagia, unspecified: Secondary | ICD-10-CM | POA: Diagnosis not present

## 2016-07-26 DIAGNOSIS — R634 Abnormal weight loss: Secondary | ICD-10-CM | POA: Diagnosis not present

## 2016-07-26 DIAGNOSIS — C787 Secondary malignant neoplasm of liver and intrahepatic bile duct: Secondary | ICD-10-CM | POA: Diagnosis not present

## 2016-07-26 DIAGNOSIS — D509 Iron deficiency anemia, unspecified: Secondary | ICD-10-CM | POA: Diagnosis not present

## 2016-07-26 DIAGNOSIS — H2511 Age-related nuclear cataract, right eye: Secondary | ICD-10-CM | POA: Diagnosis not present

## 2016-07-28 ENCOUNTER — Encounter: Admitting: Surgery

## 2016-07-28 DIAGNOSIS — L97512 Non-pressure chronic ulcer of other part of right foot with fat layer exposed: Secondary | ICD-10-CM | POA: Diagnosis not present

## 2016-07-28 DIAGNOSIS — E11621 Type 2 diabetes mellitus with foot ulcer: Secondary | ICD-10-CM | POA: Diagnosis not present

## 2016-07-28 DIAGNOSIS — L89513 Pressure ulcer of right ankle, stage 3: Secondary | ICD-10-CM | POA: Diagnosis not present

## 2016-07-30 NOTE — Progress Notes (Signed)
[name], [name] ([id number]) Visit Report for 07/28/2016 Chief Complaint Document Details Patient Name: [name], [name]. Date of Service: 07/28/2016 11:15 AM Medical Record Number: [id number] Patient Account Number: [id number] Date of Birth/Sex: 09-03-1940 (76 y.o. Male) Treating RN: Baruch Gouty, RN, BSN, Velva Harman Primary Care Provider: Enid Derry Other Clinician: Referring Provider: Enid Derry Treating Provider/Extender: Frann Rider in Treatment: 20 Information Obtained from: Patient Chief Complaint Patient is at the clinic for treatment of an open pressure ulcer to the right lateral ankle which she's had for about 2 months Electronic Signature(s) Signed: 07/28/2016 12:04:57 PM By: Christin Fudge MD, FACS Entered By: Christin Fudge on 07/28/2016 12:04:57 [name] ([id number]) -------------------------------------------------------------------------------- Debridement Details Patient Name: [name] Date of Service: 07/28/2016 11:15 AM Medical Record Number: [id number] Patient Account Number: [id number] Date of Birth/Sex: [birth year] (76 y.o. Male) Treating RN: Baruch Gouty, RN, BSN, [place] Primary Care Provider: Enid Derry Other Clinician: Referring Provider: Enid Derry Treating Provider/Extender: Frann Rider in Treatment: 20 Debridement Performed for Wound #3 Right,Plantar Foot Assessment: Performed By: Physician Christin Fudge, MD Debridement: Debridement Severity of Tissue Pre Fat layer exposed Debridement: Pre-procedure Verification/Time Out Yes - 11:36 Taken: Start Time: 11:36 Pain Control: Lidocaine 4% Topical Solution Level: Skin/Subcutaneous Tissue Total Area Debrided (L x 1 (cm) x 1.1 (cm) = 1.1 (cm) W): Tissue and other Non-Viable, Callus, Fat, Fibrin/Slough, Subcutaneous material debrided: Instrument: Curette Bleeding: Minimum Hemostasis Achieved: Pressure End Time: 11:38 Procedural Pain: 0 Post Procedural Pain: 0 Response to  Treatment: Procedure was tolerated well Post Debridement Measurements of Total Wound Length: (cm) 1 Width: (cm) 1.1 Depth: (cm) 0.1 Volume: (cm) 0.086 Character of Wound/Ulcer Post Stable Debridement: Severity of Tissue Post Debridement: Fat layer exposed Post Procedure Diagnosis Same as Pre-procedure Electronic Signature(s) Signed: 07/28/2016 12:04:49 PM By: Christin Fudge MD, FACS Signed: 07/28/2016 4:09:52 PM By: Regan Lemming BSN, RN [name] ([id number]) Entered By: Christin Fudge on 07/28/2016 12:04:49 [name] ([id number]) -------------------------------------------------------------------------------- HPI Details Patient Name: [name], [name]. Date of Service: 07/28/2016 11:15 AM Medical Record Number: [id number] Patient Account Number: [id number] Date of Birth/Sex: 09/21/1940 (76 y.o. Male) Treating RN: Baruch Gouty, RN, BSN, Velva Harman Primary Care Provider: Enid Derry Other Clinician: Referring Provider: Enid Derry Treating Provider/Extender: Frann Rider in Treatment: 20 History of Present Illness Location: Patient presents with an ulcer on the right lateral ankle. Quality: Patient reports experiencing a dull pain to affected area(s). Severity: Patient states wound are getting worse. Duration: Patient has had the wound for > 2 months prior to seeking treatment at the wound center Timing: Pain in wound is Intermittent (comes and goes Context: The wound appeared gradually over time Modifying Factors: Other treatment(s) tried include:local care as per the medical oncologist Associated Signs and Symptoms: Patient reports having:some pain and no discharge HPI Description: 76 year old patient has been referred by his medical oncologist Dr. Grayland Ormond, for a right lateral malleolus ulcer has been there for several weeks. 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 He is currently on chemotherapy with FOLFOX  and Herceptin. past medical history significant for diabetes mellitus, anemia, hypertension, sleep apnea, status post appendectomy, peripheral vascular catheterization( Port placement) in September 2017 by Dr. Delana Meyer. He is also receiving palliative radiation therapy and was seen by Dr. Donella Stade. 03/17/2016 -- - x-ray of the right ankle -- IMPRESSION: 1. Soft tissue wound noted over the lateral malleolus, no underlying bony abnormality. No acute bony abnormality. 2. Diffuse degenerative change. 3. Peripheral  vascular disease. 05/05/2016 -- the patient's wife tells me that she has noticed a small superficial bruise on the left lateral ankle and wanted me to view this area. 05/12/2016 -- the patient is unable to afford Santyl ointment and we have been struggling over the last 8 weeks to use various alternatives. I have asked them today if they would be willing to use the snap vacuum system and she will let me know soon. 05/19/2016 -- the patient and the wife have declined the use of the snap the vacuum system. He does not want any debridement to be done today. His proteins have improved a bit. 06/08/2016 -- . Not seen the patient back for 3 weeks and in the meanwhile the wife has decided to use duoderm on the wound,-- given to her by a friend. The wound is macerated and has increased in size due to the moisture. 06/16/2016 -- besides the original wound he had on his right lateral malleolus he has a small area on the dorsum of his right foot which is possibly cause due to pressure. He now has a plantar wound on the first metatarsal head on the right foot where his podiatrist Dr. Vickki Muff had debrided a callus yesterday. Electronic Signature(s) Signed: 07/28/2016 12:05:01 PM By: Christin Fudge MD, FACS Entered By: Christin Fudge on 07/28/2016 12:05:01 [name] ([name], [name] ([id number]) -------------------------------------------------------------------------------- Physical  Exam Details Patient Name: [name], [name] Date of Service: 07/28/2016 11:15 AM Medical Record Number: [id number] Patient Account Number: [id number] Date of Birth/Sex: 12-Oct-1940 (76 y.o. Male) Treating RN: Baruch Gouty, RN, BSN, Velva Harman Primary Care Provider: Enid Derry Other Clinician: Referring Provider: Enid Derry Treating Provider/Extender: Frann Rider in Treatment: 20 Constitutional . Pulse regular. Respirations normal and unlabored. Afebrile. . Eyes Nonicteric. Reactive to light. Ears, Nose, Mouth, and Throat Lips, teeth, and gums WNL.Marland Kitchen Moist mucosa without lesions. Neck supple and nontender. No palpable supraclavicular or cervical adenopathy. Normal sized without goiter. Respiratory WNL. No retractions.. Cardiovascular Pedal Pulses WNL. No clubbing, cyanosis or edema. Lymphatic No adneopathy. No adenopathy. No adenopathy. Musculoskeletal Adexa without tenderness or enlargement.. Digits and nails w/o clubbing, cyanosis, infection, petechiae, ischemia, or inflammatory conditions.. Integumentary (Hair, Skin) No suspicious lesions. No crepitus or fluctuance. No peri-wound warmth or erythema. No masses.Marland Kitchen Psychiatric Judgement and insight Intact.. No evidence of depression, anxiety, or agitation.. Notes The right ankle wound is looking clean today and no sharp debridement was required. In another couple of weeks he will be ready for a skin substitute. The right plantar foot wound needed sharp debridement and eschar and subtenons debris was removed and brisk bleeding controlled with pressure Electronic Signature(s) Signed: 07/28/2016 12:05:43 PM By: Christin Fudge MD, FACS Entered By: Christin Fudge on 07/28/2016 12:05:43 [name] ([id number]) -------------------------------------------------------------------------------- Physician Orders Details Patient Name: [name] Date of Service: 07/28/2016 11:15 AM Medical Record Number: [id number] Patient Account  Number: [id number] Date of Birth/Sex: 04-23-1940 (76 y.o. Male) Treating RN: Baruch Gouty, RN, BSN, Velva Harman Primary Care Provider: Enid Derry Other Clinician: Referring Provider: Enid Derry Treating Provider/Extender: Frann Rider in Treatment: 20 Verbal / Phone Orders: No Diagnosis Coding Wound Cleansing Wound #1 Right,Lateral Malleolus o Clean wound with Normal Saline. o Cleanse wound with mild soap and water Wound #3 Right,Plantar Foot o Clean wound with Normal Saline. o Cleanse wound with mild soap and water Anesthetic Wound #1 Right,Lateral Malleolus o Topical Lidocaine 4% cream applied to wound bed prior to debridement Wound #3 Right,Plantar Foot o Topical Lidocaine 4% cream  applied to wound bed prior to debridement Skin Barriers/Peri-Wound Care Wound #1 Right,Lateral Malleolus o Antifungal cream - LOTRISONSE.Marland Kitchenapply around wound on reddened areas as needed Primary Wound Dressing Wound #1 Right,Lateral Malleolus o Hydrogel - similar to KY jelly over the counter o Cutimed Sorbact - cut small piece and insert in wound Wound #3 Right,Plantar Foot o Aquacel Ag Secondary Dressing Wound #1 Right,Lateral Malleolus o Dry Gauze o Boardered Foam Dressing Wound #3 Right,Plantar Foot o Dry Gauze o Boardered Foam Dressing [name], [name] ([id number]) o Drawtex Dressing Change Frequency Wound #1 Right,Lateral Malleolus o Change dressing every day. Follow-up Appointments Wound #1 Right,Lateral Malleolus o Return Appointment in 1 week. Edema Control Wound #1 Right,Lateral Malleolus o Elevate legs to the level of the heart and pump ankles as often as possible Off-Loading Wound #1 Right,Lateral Malleolus o Other: - Keep pressure off the malleolus Wound #3 Right,Plantar Foot o Other: - Keep pressure off the plantar foot Additional Orders / Instructions Wound #1 Right,Lateral Malleolus o Increase protein intake. o Activity as  tolerated Medications-please add to medication list. Wound #1 Right,Lateral Malleolus o Other: - Include these in your diet...VITAMIN A, C, ZINC, MVI Electronic Signature(s) Signed: 07/28/2016 4:09:52 PM By: Regan Lemming BSN, RN Signed: 07/28/2016 4:13:16 PM By: Christin Fudge MD, FACS Entered By: Regan Lemming on 07/28/2016 11:42:42 [name] ([id number]) -------------------------------------------------------------------------------- Problem List Details Patient Name: [name], [name] Date of Service: 07/28/2016 11:15 AM Medical Record Number: [id number] Patient Account Number: [id number] Date of Birth/Sex: [birth year] (76 y.o. 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 ulcer 05/05/2016 Yes L89.512 Pressure ulcer of right ankle, stage 2 05/05/2016 Yes L89.521 Pressure ulcer of left ankle, stage 1 05/05/2016 Yes 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 05/05/2016 Yes Inactive Problems Resolved Problems Electronic Signature(s) Signed: 07/28/2016 12:04:12 PM By: Christin Fudge MD, FACS Entered By: Christin Fudge on 07/28/2016 12:04:11 [name] ([id number]) -------------------------------------------------------------------------------- Progress Note Details Patient Name: [name] Date of Service: 07/28/2016 11:15 AM Medical Record Number: [id number] Patient Account Number: [id number] Date of Birth/Sex: 02-23-1940 (76 y.o. Male) Treating RN: Baruch Gouty, RN, BSN, Velva Harman Primary Care Provider: Enid Derry Other Clinician: Referring Provider: Enid Derry Treating Provider/Extender: Frann Rider in Treatment: 20 Subjective Chief Complaint Information obtained from Patient Patient is at the clinic for  treatment of an open pressure ulcer to the right lateral ankle which she's had for about 2 months History of Present Illness (HPI) The following HPI elements were documented for the patient's wound: Location: Patient presents with an ulcer on the right lateral ankle. Quality: Patient reports experiencing a dull pain to affected area(s). Severity: Patient states wound are getting worse. Duration: Patient has had the wound for > 2 months prior to seeking treatment at the wound center Timing: Pain in wound is Intermittent (comes and goes Context: The wound appeared gradually over time Modifying Factors: Other treatment(s) tried include:local care as per the medical oncologist Associated Signs and Symptoms: Patient reports having:some pain and no discharge 76 year old patient has been referred by his medical oncologist Dr. Grayland Ormond, for a right lateral malleolus ulcer has been there for several weeks. 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 He is currently on chemotherapy with FOLFOX and Herceptin. past medical history significant for diabetes  mellitus, anemia, hypertension, sleep apnea, status post appendectomy, peripheral vascular catheterization( Port placement) in September 2017 by Dr. Delana Meyer. He is also receiving palliative radiation therapy and was seen by Dr. Donella Stade. 03/17/2016 -- - x-ray of the right ankle -- IMPRESSION: 1. Soft tissue wound noted over the lateral malleolus, no underlying bony abnormality. No acute bony abnormality. 2. Diffuse degenerative change. 3. Peripheral vascular disease. 05/05/2016 -- the patient's wife tells me that she has noticed a small superficial bruise on the left lateral ankle and wanted me to view this area. 05/12/2016 -- the patient is unable to afford Santyl ointment and we have been struggling over the last 8 weeks to use various alternatives. I have asked them today if they would be willing  to use the snap vacuum system and she will let me know soon. 05/19/2016 -- the patient and the wife have declined the use of the snap the vacuum system. He does not want any debridement to be done today. His proteins have improved a bit. 06/08/2016 -- . Not seen the patient back for 3 weeks and in the meanwhile the wife has decided to use duoderm on the wound,-- given to her by a friend. The wound is macerated and has increased in size due to the moisture. [name], [name] ([id number]) 06/16/2016 -- besides the original wound he had on his right lateral malleolus he has a small area on the dorsum of his right foot which is possibly cause due to pressure. He now has a plantar wound on the first metatarsal head on the right foot where his podiatrist Dr. Vickki Muff had debrided a callus yesterday. Allergies sufa drugs, Cephalosporins, lisinopril, losartin, Keflex, amoxicillin, ampicillin, Septra, Bactrim Objective Constitutional Pulse regular. Respirations normal and unlabored. Afebrile. Vitals Time Taken: 11:21 AM, Height: 67 in, Weight: 136 lbs, BMI: 21.3, Temperature: 97.8 F, Pulse: 77 bpm, Respiratory Rate: 17 breaths/min, Blood Pressure: 148/54 mmHg. Eyes Nonicteric. Reactive to light. Ears, Nose, Mouth, and Throat Lips, teeth, and gums WNL.Marland Kitchen Moist mucosa without lesions. Neck supple and nontender. No palpable supraclavicular or cervical adenopathy. Normal sized without goiter. Respiratory WNL. No retractions.. Cardiovascular Pedal Pulses WNL. No clubbing, cyanosis or edema. Lymphatic No adneopathy. No adenopathy. No adenopathy. Musculoskeletal Adexa without tenderness or enlargement.. Digits and nails w/o clubbing, cyanosis, infection, petechiae, ischemia, or inflammatory conditions.Marland Kitchen Psychiatric Judgement and insight Intact.. No evidence of depression, anxiety, or agitation.. General Notes: The right ankle wound is looking clean today and no sharp debridement was required.  In another couple of weeks he will be ready for a skin substitute. The right plantar foot wound needed sharp [name], [name]. ([id number]) debridement and eschar and subtenons debris was removed and brisk bleeding controlled with pressure Integumentary (Hair, Skin) No suspicious lesions. No crepitus or fluctuance. No peri-wound warmth or erythema. No masses.. Wound #1 status is Open. Original cause of wound was Gradually Appeared. The wound is located on the Right,Lateral Malleolus. The wound measures 0.6cm length x 0.8cm width x 0.2cm depth; 0.377cm^2 area and 0.075cm^3 volume. There is Fat Layer (Subcutaneous Tissue) Exposed exposed. There is no tunneling or undermining noted. There is a medium amount of serous drainage noted. The wound margin is flat and intact. There is large (67-100%) pink, pale granulation within the wound bed. There is no necrotic tissue within the wound bed. The periwound skin appearance exhibited: Induration, Erythema. The periwound skin appearance did not exhibit: Dry/Scaly, Maceration. The surrounding wound skin color is noted with erythema which is circumferential. Periwound temperature was  noted as No Abnormality. The periwound has tenderness on palpation. Wound #3 status is Open. Original cause of wound was Pressure Injury. The wound is located on the [place]. The wound measures 1cm length x 1.1cm width x 0.1cm depth; 0.864cm^2 area and 0.086cm^3 volume. There is Fat Layer (Subcutaneous Tissue) Exposed exposed. There is no tunneling or undermining noted. There is a large amount of serous drainage noted. The wound margin is distinct with the outline attached to the wound base. There is small (1-33%) pink, pale granulation within the wound bed. There is a medium (34-66%) amount of necrotic tissue within the wound bed including Adherent Slough. The periwound skin appearance exhibited: Callus, Maceration. The periwound skin appearance did not exhibit:  Crepitus, Excoriation, Induration, Rash, Scarring, Dry/Scaly, Atrophie Blanche, Cyanosis, Ecchymosis, Hemosiderin Staining, Mottled, Pallor, Rubor, Erythema. Periwound temperature was noted as No Abnormality. The periwound has tenderness on palpation. Assessment Active Problems ICD-10 E11.621 - Type 2 diabetes mellitus with foot ulcer L89.512 - Pressure ulcer of right ankle, stage 2 L89.521 - Pressure ulcer of left ankle, stage 1 E44.1 - Mild protein-calorie malnutrition Procedures Wound #3 Pre-procedure diagnosis of Wound #3 is a Diabetic Wound/Ulcer of the Lower Extremity located on the Right,Plantar Foot .Severity of Tissue Pre Debridement is: Fat layer exposed. There was a Skin/Subcutaneous Tissue Debridement ([id number]) debridement with total area of 1.1 sq cm performed [name], [name] ([id number]) by Christin Fudge, MD. with the following instrument(s): Curette to remove Non-Viable tissue/material including Fat Layer (and Subcutaneous Tissue) Exposed, Fibrin/Slough, Callus, and Subcutaneous after achieving pain control using Lidocaine 4% Topical Solution. A time out was conducted at 11:36, prior to the start of the procedure. A Minimum amount of bleeding was controlled with Pressure. The procedure was tolerated well with a pain level of 0 throughout and a pain level of 0 following the procedure. Post Debridement Measurements: 1cm length x 1.1cm width x 0.1cm depth; 0.086cm^3 volume. Character of Wound/Ulcer Post Debridement is stable. Severity of Tissue Post Debridement is: Fat layer exposed. Post procedure Diagnosis Wound #3: Same as Pre-Procedure Plan Wound Cleansing: Wound #1 Right,Lateral Malleolus: Clean wound with Normal Saline. Cleanse wound with mild soap and water Wound #3 Right,Plantar Foot: Clean wound with Normal Saline. Cleanse wound with mild soap and water Anesthetic: Wound #1 Right,Lateral Malleolus: Topical Lidocaine 4% cream applied to wound bed prior to  debridement Wound #3 Right,Plantar Foot: Topical Lidocaine 4% cream applied to wound bed prior to debridement Skin Barriers/Peri-Wound Care: Wound #1 Right,Lateral Malleolus: Antifungal cream - LOTRISONSE.Marland Kitchenapply around wound on reddened areas as needed Primary Wound Dressing: Wound #1 Right,Lateral Malleolus: Hydrogel - similar to [place] jelly over the counter Cutimed Sorbact - cut small piece and insert in wound Wound #3 Right,Plantar Foot: Aquacel Ag Secondary Dressing: Wound #1 Right,Lateral Malleolus: Dry Gauze Boardered Foam Dressing Wound #3 Right,Plantar Foot: Dry Gauze Boardered Foam Dressing Drawtex Dressing Change Frequency: Wound #1 Right,Lateral Malleolus: Change dressing every day. Follow-up Appointments: [name], [name] ([id number]) Wound #1 Right,Lateral Malleolus: Return Appointment in 1 week. Edema Control: Wound #1 Right,Lateral Malleolus: Elevate legs to the level of the heart and pump ankles as often as possible Off-Loading: Wound #1 Right,Lateral Malleolus: Other: - Keep pressure off the malleolus Wound #3 Right,Plantar Foot: Other: - Keep pressure off the plantar foot Additional Orders / Instructions: Wound #1 Right,Lateral Malleolus: Increase protein intake. Activity as tolerated Medications-please add to medication list.: Wound #1 Right,Lateral Malleolus: Other: - Include these in your diet...VITAMIN A, C, ZINC, MVI There was good  improvement this week and I have recommended: 1. Sorbact with hydrogel will be applied to his right ankle and 2. silver alginate with an offloading felt to the plantar aspect of his right foot. 3. we will arrange for a Grafix to be applied on 08/11/2016 He will come back and see as next week Electronic Signature(s) Signed: 07/28/2016 12:06:54 PM By: Christin Fudge MD, FACS Entered By: Christin Fudge on 07/28/2016 12:06:53 [name]  ([id number]) -------------------------------------------------------------------------------- [place] Details Patient Name: [name] Date of Service: 07/28/2016 Medical Record Number: [id number] Patient Account Number: [id number] Date of Birth/Sex: April 22, 1940 (76 y.o. Male) Treating RN: Baruch Gouty, RN, BSN, [place] Primary Care Provider: Enid Derry Other Clinician: Referring Provider: Enid Derry Treating Provider/Extender: Frann Rider in Treatment: 20 Diagnosis Coding ICD-10 Codes Code Description E11.621 Type 2 diabetes mellitus with foot ulcer L89.512 Pressure ulcer of right ankle, stage 2 L89.521 Pressure ulcer of left ankle, stage 1 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 Facility Procedures CPT4 Code: 20919802 Description: 21798 - DEB SUBQ TISSUE 20 SQ CM/< ICD-10 Description Diagnosis E11.621 Type 2 diabetes mellitus with foot ulcer L89.512 Pressure ulcer of right ankle, stage 2 L89.521 Pressure ulcer of left ankle, stage 1 E44.1 Mild protein-calorie  malnutrition Modifier: Quantity: 1 Physician Procedures CPT4 Code: [id number] Description: 28241 - WC PHYS SUBQ TISS 20 SQ CM ICD-10 Description Diagnosis E11.621 Type 2 diabetes mellitus with foot ulcer L89.512 Pressure ulcer of right ankle, stage 2 L89.521 Pressure ulcer of left ankle, stage 1 E44.1 Mild protein-calorie  malnutrition Modifier: Quantity: 1 Electronic Signature(s) Signed: 07/28/2016 12:07:16 PM By: Christin Fudge MD, FACS Entered By: Christin Fudge on 07/28/2016 12:07:15

## 2016-07-30 NOTE — Progress Notes (Signed)
CAIDAN, HUBBERT ([id number]) Visit Report for 07/28/2016 Allergy List Details Patient Name: [name], [name]. Date of Service: 07/28/2016 11:15 AM Medical Record Number: [id number] Patient Account Number: [id number] Date of Birth/Sex: [birth year] (76 y.o. Male) Treating RN: Baruch Gouty, RN, BSN, Velva Harman Primary Care Franciscojavier Wronski: En[place] Derry Other Clinician: Referring Brit Wernette: [place] Derry Treating Guelda Batson/Extender: Frann Rider in Treatment: 20 Allergies Active Allergies sufa drugs Cephalosporins lisinopril losartin Keflex amoxicillin ampicillin Septra Bactrim Allergy Notes Electronic Signature(s) Signed: 07/28/2016 4:09:52 PM By: Regan Lemming BSN, RN Entered By: Regan Lemming on 07/28/2016 11:53:47 [name] ([id number]) -------------------------------------------------------------------------------- [place] Details Patient Name: [name] Date of Service: 07/28/2016 11:15 AM Medical Record Number: [id number] Patient Account Number: [id number] Date of Birth/Sex: 1940-03-09 (76 y.o. Male) Treating RN: Afful, RN, BSN, [place] Primary Care Brice Potteiger: En[place] Derry Other Clinician: Referring Jaclyn Andy: [place] Derry Treating Devyn Griffing/Extender: Frann Rider in Treatment: 20 Visit Information Patient Arrived: Ambulatory Arrival Time: 11:18 Accompanied By: wife Transfer Assistance: None Patient Identification Verified: Yes Patient Requires Transmission- No Based Precautions: Patient Has Alerts: Yes Patient Alerts: Patient on Blood Thinner DM II 81mg  aspirin twice daily History Since Last Visit All ordered tests and consults were completed: No Added or deleted any medications: No Any new allergies or adverse reactions: No Had a fall or experienced change in activities of daily living that may affect risk of falls: No Signs or symptoms of abuse/neglect since last visito No Hospitalized since last visit: No Has Dressing in Place as Prescribed:  Yes Electronic Signature(s) Signed: 07/28/2016 4:09:52 PM By: Regan Lemming BSN, RN Entered By: Regan Lemming on 07/28/2016 11:18:52 [name] ([id number]) -------------------------------------------------------------------------------- Encounter Discharge Information Details Patient Name: [name] Date of Service: 07/28/2016 11:15 AM Medical Record Number: [id number] Patient Account Number: [id number] Date of Birth/Sex: 02-06-1941 (76 y.o. Male) Treating RN: Baruch Gouty, RN, BSN, Velva Harman Primary Care Lealand Elting: Enid Derry Other Clinician: Referring Emmely Bittinger: [place] D[place] Treating Serge Main/Extender: Frann Rider in Treatment: 20 Encounter Discharge Information Items Discharge Pain Level: 0 Discharge Condition: Stable Ambulatory Status: Ambulatory Discharge Destination: Home Transportation: Private Auto Accompanied By: wife Schedule Follow-up Appointment: No Medication Reconciliation completed and provided to Patient/Care No Rachel Samples: Provided on Clinical Summary of Care: 07/28/2016 Form Type Recipient Paper Patient FW Electronic Signature(s) Signed: 07/28/2016 11:54:00 AM By: Ruthine Dose Entered By: Ruthine Dose on 07/28/2016 11:53:59 [name] ([id number]) -------------------------------------------------------------------------------- Lower Extremity Assessment Details Patient Name: [name] Date of Service: 07/28/2016 11:15 AM Medical Record Number: [id number] Patient Account Number: [id number] Date of Birth/Sex: 27-Aug-1940 (76 y.o. Male) Treating RN: Baruch Gouty, RN, BSN, Velva Harman Primary Care Nimsi Males: [place] Derry Other Clinician: Referring Lolah Coghlan: [place] Derry Treating Rahul Malinak/Extender: Frann Rider in Treatment: 20 Vascular Assessment Claudication: Claudication Assessment [Right:None] Pulses: Dorsalis Pedis Palpable: [Right:Yes] Posterior Tibial Extremity colors, hair growth, and conditions: Extremity Color: [Right:Normal] Hair  Growth on Extremity: [Right:Yes] Temperature of Extremity: [Right:Warm] Capillary Refill: [Right:< 3 seconds] Electronic Signature(s) Signed: 07/28/2016 4:09:52 PM By: Regan Lemming BSN, RN Entered By: Regan Lemming on 07/28/2016 11:21:32 [name] ([id number]) -------------------------------------------------------------------------------- Multi Wound Chart Details Patient Name: [name] Date of Service: 07/28/2016 11:15 AM Medical Record Number: [id number] Patient Account Number: [id number] Date of Birth/Sex: [birth year] (76 y.o. 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] [N/A:N/A] Secondary Etiology: [1:Diabetic Wound/Ulcer of the Lower Extremity] [3:N/A] [N/A:N/A] Comorbid History: [1:Cataracts, Chronic sinus problems/congestion, Anemia, Sleep Apnea, Hypertension, Myocardial Infarction, Type II Diabetes, Received Chemotherapy, Received Radiation] [3:Cataracts, Chronic sinus problems/congestion, Anemia, Sleep  Apnea, Hypertension, Myocardial Infarction, Type II Diabetes, Received Chemotherapy, Received Radiation] [N/A:N/A] Date Acquired: [1:01/09/2016] [3:06/15/2016] [N/A:N/A] Weeks of Treatment: [1:20] [3:6] [N/A:N/A] Wound Status: [1:Open] [3:Open] [N/A:N/A] Measurements L x W x D 0.6x0.8x0.2 [3:1x1.1x0.1] [N/A:N/A] (cm) Area (cm) : [1:0.377] [3:0.864] [N/A:N/A] Volume  (cm) : [1:0.075] [3:0.086] [N/A:N/A] % Reduction in Area: [1:-59.70%] [3:51.10%] [N/A:N/A] % Reduction in Volume: -212.50% [3:96.80%] [N/A:N/A] Classification: [1:Category/Stage III] [3:Grade 1] [N/A:N/A] HBO Classification: [1:Grade 1] [3:N/A] [N/A:N/A] Exudate Amount: [1:Medium] [3:Large] [N/A:N/A] Exudate Type: [1:Serous] [3:Serous] [N/A:N/A] Exudate Color: [1:amber] [3:amber] [N/A:N/A] Wound Margin: [1:Flat and Intact] [3:Distinct, outline attached] [N/A:N/A] Granulation Amount: Large (67-100%) Small (1-33%) N/A Granulation Quality: Pink, Pale Pink, Pale N/A Necrotic Amount: None Present (0%) Medium (34-66%) N/A Exposed Structures: Fat Layer (Subcutaneous Fat Layer (Subcutaneous N/A Tissue) Exposed: Yes Tissue) Exposed: Yes Fascia: No Fascia: No Tendon: No Tendon: No Muscle: No Muscle: No Joint: No Joint: No Bone: No Bone: No Epithelialization: None None N/A Debridement: N/A Debridement ([id number] N/A [id number]) Pre-procedure N/A 11:36 N/A Verification/Time Out Taken: Pain Control: N/A Lidocaine 4% Topical N/A Solution Tissue Debrided: N/A Fibrin/Slough, Fat, Callus, N/A Subcutaneous Level: N/A Skin/Subcutaneous N/A Tissue Debridement Area (sq N/A 1.1 N/A cm): Instrument: N/A Curette N/A Bleeding: N/A Minimum N/A Hemostasis Achieved: N/A Pressure N/A Procedural Pain: N/A 0 N/A Post Procedural Pain: N/A 0 N/A Debridement Treatment N/A Procedure was tolerated N/A Response: well Post Debridement N/A 1x1.1x0.1 N/A Measurements L x W x D (cm) Post Debridement N/A 0.086 N/A Volume: (cm) Periwound Skin Texture: Induration: Yes Callus: Yes N/A Excoriation: No Induration: No Crepitus: No Rash: No Scarring: No Periwound Skin Maceration: No Maceration: Yes N/A Moisture: Dry/Scaly: No Dry/Scaly: No Periwound Skin Color: Erythema: Yes Atrophie Blanche: No N/A Cyanosis: No Ecchymosis: No Erythema: No Hemosiderin Staining: No Mottled: No [name], [name]  ([id number]) Pallor: No Rubor: No Erythema Location: Circumferential N/A N/A Erythema Change: Decreased N/A N/A Temperature: No Abnormality No Abnormality N/A Tenderness on Yes Yes N/A Palpation: Wound Preparation: Ulcer Cleansing: Ulcer Cleansing: N/A Rinsed/Irrigated with Rinsed/Irrigated with Saline Saline Topical Anesthetic Topical Anesthetic Applied: Other: lidocaine Applied: Other: lidocaine 4% 4% Procedures Performed: N/A Debridement N/A Treatment Notes Wound #1 (Right, Lateral Malleolus) 1. Cleansed with: Clean wound with Normal Saline 2. Anesthetic Topical Lidocaine 4% cream to wound bed prior to debridement 3. Peri-wound Care: Skin Prep 4. Dressing Applied: Aquacel Ag Hydrogel 5. Secondary Dressing Applied Bordered Foam Dressing Dry Gauze Notes sorbact on malleolus Wound #3 (Right, Plantar Foot) 1. Cleansed with: Clean wound with Normal Saline 2. Anesthetic Topical Lidocaine 4% cream to wound bed prior to debridement 3. Peri-wound Care: Skin Prep 4. Dressing Applied: Aquacel Ag Hydrogel 5. Secondary Dressing Applied Bordered Foam Dressing Dry Gauze Notes [name], [name] ([id number]) sorbact on malleolus Electronic Signature(s) Signed: 07/28/2016 12:04:19 PM By: Christin Fudge MD, FACS Entered By: Christin Fudge on 07/28/2016 12:04:19 [name] ([id number]) -------------------------------------------------------------------------------- [place] Details Patient Name: [name], [name] Date of Service: 07/28/2016 11:15 AM Medical Record Number: [id number] Patient Account Number: [id number] Date of Birth/Sex: May 26, 1940 (76 y.o. Male) Treating RN: Baruch Gouty, RN, BSN, [employer]  Care Juni Glaab: Enid Derry Other Clinician: Referring Donell Sliwinski: Enid Derry Treating Tyreka Henneke/Extender: Frann Rider in Treatment: 20 Active Inactive ` Orientation to the Wound Care Program Nursing Diagnoses: Knowledge deficit related to the  wound healing center program Goals: Patient/caregiver will verbalize understanding of the [place] Program Date Initiated: 03/10/2016 Target Resolution Date: 03/23/2016 Goal Status: Active Interventions: Provide education on orientation to the wound center Notes: ` Soft Tissue Infection Nursing Diagnoses: Impaired tissue integrity Potential for infection: soft tissue Goals: Patient will remain free of wound infection Date Initiated: 03/10/2016 Target Resolution Date: 03/17/2016 Goal Status: Active Interventions: Assess signs and symptoms of infection every visit Notes: ` Wound/Skin Impairment Nursing Diagnoses: Impaired tissue integrity [name], [name] ([id number]) Goals: Ulcer/skin breakdown will heal within 14 weeks Date Initiated: 03/10/2016 Target Resolution Date: 03/24/2016 Goal Status: Active Interventions: Assess patient/caregiver ability to obtain necessary supplies Notes: Electronic Signature(s) Signed: 07/28/2016 4:09:52 PM By: Regan Lemming BSN, RN Entered By: Regan Lemming on 07/28/2016 11:36:42 [name] ([id number]) -------------------------------------------------------------------------------- Pain Assessment Details Patient Name: [name] Date of Service: 07/28/2016 11:15 AM Medical Record Number: [id number] Patient Account Number: [id number] Date of Birth/Sex: [birth year] (76 y.o. Male) Treating RN: Baruch Gouty, RN, BSN, Velva Harman Primary Care Katrine Radich: En[place] Derry Other Clinician: Referring Sun Wilensky: [place] Derry Treating Haliey Romberg/Extender: Frann Rider in Treatment: 20 Active Problems Location of Pain Severity and Description of Pain Patient Has Paino No Site Locations With Dressing Change: No Pain Management and Medication Current Pain Management: Electronic Signature(s) Signed: 07/28/2016 4:09:52 PM By: Regan Lemming BSN, RN Entered By: Regan Lemming on 07/28/2016 11:18:59 [name]  ([id number]) -------------------------------------------------------------------------------- Patient/Caregiver Education Details Patient Name: [name] Date of Service: 07/28/2016 11:15 AM Medical Record Number: [id number] Patient Account Number: [id number] Date of Birth/Gender: September 15, 1940 (76 y.o. Male) Treating RN: Baruch Gouty, RN, BSN, Velva Harman Primary Care Physician: Enid Derry Other Clinician: Referring Physician: Enid Derry Treating Physician/Extender: Frann Rider in Treatment: 20 Education Assessment Education Provided To: Patient Education Topics Provided Welcome To [facility]: Methods: Explain/Verbal Responses: State content correctly Wound Debridement: Methods: Explain/Verbal Responses: State content correctly Wound/Skin Impairment: Methods: Explain/Verbal Responses: State content correctly Electronic Signature(s) Signed: 07/28/2016 4:09:52 PM By: Regan Lemming BSN, RN Entered By: Regan Lemming on 07/28/2016 11:53:32 [name] ([id number]) -------------------------------------------------------------------------------- Wound Assessment Details Patient Name: [name] Date of Service: 07/28/2016 11:15 AM Medical Record Number: [id number] Patient Account Number: [id number] Date of Birth/Sex: 1940-09-25 (76 y.o. Male) Treating RN: Baruch Gouty, RN, BSN, [place] Primary Care Ashante Snelling: Enid Derry Other Clinician: Referring Rhet Rorke: Enid Derry Treating Kehinde Bowdish/Extender: Frann Rider in Treatment: 20 Wound Status Wound Number: 1 Primary Pressure Ulcer Etiology: Wound Location: Right Malleolus - Lateral Secondary Diabetic Wound/Ulcer of the Lower Wounding Event: Gradually Appeared Etiology: Extremity Date Acquired: 01/09/2016 Wound Open Weeks Of Treatment: 20 Status: Clustered Wound: No Comorbid Cataracts, Chronic sinus History: problems/congestion, Anemia, Sleep Apnea, Hypertension, Myocardial Infarction, Type II Diabetes,  Received Chemotherapy, Received Radiation Photos Photo Uploaded By: Gretta Cool, BSN, RN, CWS, Kim on 07/28/2016 14:56:08 Wound Measurements Length: (cm) 0.6 Width: (cm) 0.8 Depth: (cm) 0.2 Area: (cm) 0.377 Volume: (cm) 0.075 % Reduction in Area: -59.7% % Reduction in Volume: -212.5% Epithelialization: None Tunneling: No Undermining: No Wound Description 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 Severity (Wagner): Grade 1 Slough/Fibrin Wound Margin: Flat and Intact Exudate Amount: Medium Exudate Type: Serous Exudate Color: amber [name], [name] ([id number]) er Cleansing: No o Yes Wound Bed Granulation Amount: Large (67-100%) Exposed Structure Granulation Quality: Pink,  Pale Fascia Exposed: No Necrotic Amount: None Present (0%) Fat Layer (Subcutaneous Tissue) Exposed: Yes Tendon Exposed: No Muscle Exposed: No Joint Exposed: No Bone Exposed: No Periwound Skin Texture Texture Color No Abnormalities Noted: No No Abnormalities Noted: No Induration: Yes Erythema: Yes Erythema Location: Circumferential Moisture Erythema Change: Decreased No Abnormalities Noted: No Dry / Scaly: No Temperature / Pain Maceration: No Temperature: No Abnormality Tenderness on Palpation: Yes Wound Preparation Ulcer Cleansing: Rinsed/Irrigated with Saline Topical Anesthetic Applied: Other: lidocaine 4%, Treatment Notes Wound #1 (Right, Lateral Malleolus) 1. Cleansed with: Clean wound with Normal Saline 2. Anesthetic Topical Lidocaine 4% cream to wound bed prior to debridement 3. Peri-wound Care: Skin Prep 4. Dressing Applied: Aquacel Ag Hydrogel 5. Secondary Dressing Applied Bordered Foam Dressing Dry Gauze Notes sorbact on malleolus Electronic Signature(s) Signed: 07/28/2016 4:09:52 PM By: Regan Lemming BSN, RN Entered By: Regan Lemming on 07/28/2016 11:29:08 [name]  ([id number]) -------------------------------------------------------------------------------- Wound Assessment Details Patient Name: [name] Date of Service: 07/28/2016 11:15 AM Medical Record Number: [id number] Patient Account Number: [id number] Date of Birth/Sex: 06-25-1940 (76 y.o. Male) Treating RN: Baruch Gouty, RN, BSN, [place] Primary Care Vincy Feliz: Enid Derry Other Clinician: Referring Chancy Smigiel: [place] Derry Treating [name]/Extender: Frann Rider in Treatment: 20 Wound Status Wound Number: 3 Primary Diabetic Wound/Ulcer of the Lower Etiology: Extremity Wound Location: Right Foot - Plantar Wound Open Wounding Event: Pressure Injury Status: Date Acquired: 06/15/2016 Comorbid Cataracts, Chronic sinus Weeks Of Treatment: 6 History: problems/congestion, Anemia, Sleep Clustered Wound: No Apnea, Hypertension, Myocardial Infarction, Type II Diabetes, Received Chemotherapy, Received Radiation Photos Photo Uploaded By: Gretta Cool, BSN, RN, CWS, Kim on 07/28/2016 14:56:57 Wound Measurements Length: (cm) 1 Width: (cm) 1.1 Depth: (cm) 0.1 Area: (cm) 0.864 Volume: (cm) 0.086 % Reduction in Area: 51.1% % Reduction in Volume: 96.8% Epithelialization: None Tunneling: No Undermining: No Wound Description Classification: Grade 1 Foul Odor Aft Wound Margin: Distinct, outline attached Slough/Fibrin Exudate Amount: Large Exudate Type: Serous Exudate Color: amber er Cleansing: No o Yes Wound Bed Granulation Amount: Small (1-33%) Exposed Structure Granulation Quality: Pink, Pale Fascia Exposed: No [name], [name] ([id number]) Necrotic Amount: Medium (34-66%) Fat Layer (Subcutaneous Tissue) Exposed: Yes Necrotic Quality: Adherent Slough Tendon Exposed: No Muscle Exposed: No Joint Exposed: No Bone Exposed: No Periwound Skin Texture Texture Color No Abnormalities Noted: No No Abnormalities Noted: No Callus: Yes Atrophie Blanche: No Crepitus: No Cyanosis:  No Excoriation: No Ecchymosis: No Induration: No Erythema: No Rash: No Hemosiderin Staining: No Scarring: No Mottled: No Pallor: No Moisture Rubor: No No Abnormalities Noted: No Dry / Scaly: No Temperature / Pain Maceration: Yes Temperature: No Abnormality Tenderness on Palpation: Yes Wound Preparation Ulcer Cleansing: Rinsed/Irrigated with Saline Topical Anesthetic Applied: Other: lidocaine 4%, Treatment Notes Wound #3 (Right, Plantar Foot) 1. Cleansed with: Clean wound with Normal Saline 2. Anesthetic Topical Lidocaine 4% cream to wound bed prior to debridement 3. Peri-wound Care: Skin Prep 4. Dressing Applied: Aquacel Ag Hydrogel 5. Secondary Dressing Applied Bordered Foam Dressing Dry Gauze Notes sorbact on malleolus Electronic Signature(s) Signed: 07/28/2016 4:09:52 PM By: Regan Lemming BSN, RN Entered By: Regan Lemming on 07/28/2016 [place], [place] ([id number]) [name], [name] ([id number]) -------------------------------------------------------------------------------- [place] Details Patient Name: [name] Date of Service: 07/28/2016 11:15 AM Medical Record Number: [id number] Patient Account Number: [id number] Date of Birth/Sex: 04/13/1940 (76 y.o. Male) Treating RN: Baruch Gouty, RN, BSN, Velva Harman Primary Care Gamaliel Charney: Enid Derry Other Clinician: Referring Alesandro Stueve: [place] Derry Treating Yassmin Binegar/Extender: Frann Rider in Treatment: 20 Vital Signs Time Taken: 11:21  Temperature (F): 97.8 Height (in): 67 Pulse (bpm): 77 Weight (lbs): 136 Respiratory Rate (breaths/min): 17 Body Mass Index (BMI): 21.3 Blood Pressure (mmHg): 148/54 Reference Range: 80 - 120 mg / dl Electronic Signature(s) Signed: 07/28/2016 4:09:52 PM By: Regan Lemming BSN, RN Entered By: Regan Lemming on 07/28/2016 11:21:57

## 2016-08-02 DIAGNOSIS — C155 Malignant neoplasm of lower third of esophagus: Secondary | ICD-10-CM | POA: Diagnosis not present

## 2016-08-02 DIAGNOSIS — C787 Secondary malignant neoplasm of liver and intrahepatic bile duct: Secondary | ICD-10-CM | POA: Diagnosis not present

## 2016-08-02 DIAGNOSIS — D509 Iron deficiency anemia, unspecified: Secondary | ICD-10-CM | POA: Diagnosis not present

## 2016-08-02 DIAGNOSIS — R1310 Dysphagia, unspecified: Secondary | ICD-10-CM | POA: Diagnosis not present

## 2016-08-02 DIAGNOSIS — R634 Abnormal weight loss: Secondary | ICD-10-CM | POA: Diagnosis not present

## 2016-08-02 DIAGNOSIS — F0390 Unspecified dementia without behavioral disturbance: Secondary | ICD-10-CM | POA: Diagnosis not present

## 2016-08-04 ENCOUNTER — Encounter: Admitting: Physician Assistant

## 2016-08-04 DIAGNOSIS — L89513 Pressure ulcer of right ankle, stage 3: Secondary | ICD-10-CM | POA: Diagnosis not present

## 2016-08-04 DIAGNOSIS — L97512 Non-pressure chronic ulcer of other part of right foot with fat layer exposed: Secondary | ICD-10-CM | POA: Diagnosis not present

## 2016-08-04 DIAGNOSIS — E11621 Type 2 diabetes mellitus with foot ulcer: Secondary | ICD-10-CM | POA: Diagnosis not present

## 2016-08-08 NOTE — Progress Notes (Signed)
BEXLEY, MCLESTER ([id number]) Visit Report for 08/04/2016 Arrival Information Details Patient Name: BENEDICTO, CAPOZZI. Date of Service: 08/04/2016 11:15 AM Medical Record Number: [id number] Patient Account Number: [id number] Date of Birth/Sex: 27-May-1940 (76 y.o. Male) Treating RN: Cornell Barman Primary Care Sherrol Vicars: Enid Derry Other Clinician: Referring Lincoln Kleiner: Enid Derry Treating Brealyn Baril/Extender: Melburn Hake, HOYT Weeks in Treatment: 21 Visit Information History Since Last Visit Added or deleted any medications: No Patient Arrived: Ambulatory Any new allergies or adverse reactions: No Arrival Time: 11:17 Had a fall or experienced change in No Accompanied By: wife activities of daily living that may affect Transfer Assistance: None risk of falls: Patient Identification Verified: Yes Signs or symptoms of abuse/neglect since last No Secondary Verification Process Yes visito Completed: Hospitalized since last visit: No Patient Requires Transmission- No Has Dressing in Place as Prescribed: Yes Based Precautions: Pain Present Now: No Patient Has Alerts: Yes Patient Alerts: Patient on Blood Thinner DM II 81mg  aspirin twice daily Electronic Signature(s) Signed: 08/07/2016 4:22:46 PM By: Gretta Cool, BSN, RN, CWS, Kim RN, BSN Previous Signature: 08/04/2016 1:57:44 PM Version By: Gretta Cool, BSN, RN, CWS, Kim RN, BSN Entered By: Gretta Cool, BSN, RN, CWS, Kim on 08/07/2016 16:22:46 [name] ([id number]) -------------------------------------------------------------------------------- Encounter Discharge Information Details Patient Name: TAYQUAN, GASSMAN. Date of Service: 08/04/2016 11:15 AM Medical Record Number: [id number] Patient Account Number: [id number] Date of Birth/Sex: Jun 02, 1940 (76 y.o. Male) Treating RN: Cornell Barman Primary Care Areya Lemmerman: Enid Derry Other Clinician: Referring Donnavan Covault: Enid Derry Treating Jaslynn Thome/Extender: Melburn Hake, HOYT Weeks in Treatment:  21 Encounter Discharge Information Items Discharge Pain Level: 0 Discharge Condition: Stable Ambulatory Status: Ambulatory Discharge Destination: Home Transportation: Private Auto Accompanied By: self Schedule Follow-up Appointment: Yes Medication Reconciliation completed and provided to Patient/Care Yes Maisen Klingler: Provided on Clinical Summary of Care: 08/04/2016 Form Type Recipient Paper Patient FW Electronic Signature(s) Signed: 08/04/2016 1:57:44 PM By: Gretta Cool, BSN, RN, CWS, Kim RN, BSN Previous Signature: 08/04/2016 11:53:14 AM Version By: Ruthine Dose Entered By: Gretta Cool BSN, RN, CWS, Kim on 08/04/2016 11:53:49 [name] ([id number]) -------------------------------------------------------------------------------- Lower Extremity Assessment Details Patient Name: ISA, KOHLENBERG. Date of Service: 08/04/2016 11:15 AM Medical Record Number: [id number] Patient Account Number: [id number] Date of Birth/Sex: Feb 08, 1941 (76 y.o. Male) Treating RN: Cornell Barman Primary Care Janyth Riera: Enid Derry Other Clinician: Referring Nikitha Mode: Enid Derry Treating Jannessa Ogden/Extender: Melburn Hake, HOYT Weeks in Treatment: 21 Vascular Assessment Pulses: Dorsalis Pedis Palpable: [Right:Yes] Posterior Tibial Extremity colors, hair growth, and conditions: Extremity Color: [Right:Normal] Hair Growth on Extremity: [Right:Yes] Temperature of Extremity: [Right:Warm] Capillary Refill: [Right:< 3 seconds] Dependent Rubor: [Right:No] Blanched when Elevated: [Right:No] Lipodermatosclerosis: [Right:No] Toe Nail Assessment Left: Right: Thick: Yes Discolored: Yes Deformed: Yes Improper Length and Hygiene: Yes Electronic Signature(s) Signed: 08/07/2016 4:46:07 PM By: Gretta Cool, BSN, RN, CWS, Kim RN, BSN Previous Signature: 08/04/2016 1:57:44 PM Version By: Gretta Cool, BSN, RN, CWS, Kim RN, BSN Entered By: Gretta Cool, BSN, RN, CWS, Kim on 08/07/2016 16:46:07 [name]  ([id number]) -------------------------------------------------------------------------------- Multi Wound Chart Details Patient Name: [name] Date of Service: 08/04/2016 11:15 AM Medical Record Number: [id number] Patient Account Number: [id number] Date of Birth/Sex: [birth year] (76 y.o. 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: 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 Comorbid History: Cataracts, Chronic sinus Cataracts, Chronic sinus N/A problems/congestion, problems/congestion, Anemia, Sleep Apnea, Anemia, Sleep Apnea, Hypertension, Myocardial Hypertension, Myocardial Infarction, Type II Infarction, Type II Diabetes, Received Diabetes, Received Chemotherapy, Received Chemotherapy, Received Radiation Radiation Date Acquired: 01/09/2016 06/15/2016 N/A Weeks of Treatment: 21 7 N/A Wound Status: Open Open N/A Measurements L x W x D 0.6x0.7x0.2 0.7x0.9x0.1 N/A (cm) Area (cm) : 0.33 0.495 N/A Volume (cm) : 0.066 0.049 N/A % Reduction in Area: -39.80% 72.00% N/A % Reduction in Volume: -175.00% 98.20% N/A Classification: Category/Stage III Grade 1 N/A HBO Classification: Grade 1 N/A N/A WELDEN, HAUSMANN ([id number]) Exudate Amount: Medium Large N/A Exudate Type: Serous Serous N/A Exudate Color: amber amber N/A Wound Margin: Flat and Intact Distinct, outline attached N/A Granulation Amount: Large (67-100%)  Small (1-33%) N/A Granulation Quality: Pink, Pale Pink, Pale N/A Necrotic Amount: None Present (0%) Medium (34-66%) N/A Exposed Structures: Fat Layer (Subcutaneous Fat Layer (Subcutaneous N/A Tissue) Exposed: Yes Tissue) Exposed: Yes Fascia: No Fascia: No Tendon: No Tendon: No Muscle: No Muscle: No Joint: No Joint: No Bone: No Bone: No Epithelialization: None None N/A Debridement: N/A Debridement ([id number]) Pain Control: N/A Other N/A Tissue Debrided: N/A Subcutaneous N/A Level: N/A Skin/Subcutaneous N/A Tissue Debridement Area (sq N/A 0.63 N/A cm): Instrument: N/A Curette N/A Bleeding: N/A Minimum N/A Hemostasis Achieved: N/A Pressure N/A Procedural Pain: N/A 0 N/A Post Procedural Pain: N/A 0 N/A Debridement Treatment N/A Procedure was tolerated N/A Response: well Post Debridement N/A 0.7x0.9x0.2 N/A Measurements L x W x D (cm) Post Debridement N/A 0.099 N/A Volume: (cm) Periwound Skin Texture: Induration: Yes Callus: Yes N/A Excoriation: No Induration: No Crepitus: No Rash: No Scarring: No Periwound Skin Maceration: No Maceration: Yes N/A Moisture: Dry/Scaly: No Dry/Scaly: No Periwound Skin Color: Erythema: Yes Atrophie Blanche: No N/A Cyanosis: No Ecchymosis: No Erythema: No Hemosiderin Staining: No Mottled: No XSAVIER, SEELEY ([id number]) Pallor: No Rubor: No Erythema Location: Circumferential N/A N/A Erythema Change: Decreased N/A N/A Temperature: No Abnormality No Abnormality N/A Tenderness on Yes Yes N/A Palpation: Wound Preparation: Ulcer Cleansing: Ulcer Cleansing: N/A Rinsed/Irrigated with Rinsed/Irrigated with Saline Saline Topical Anesthetic Topical Anesthetic Applied: Other: lidocaine Applied: Other: lidocaine 4% 4% Procedures Performed: N/A Debridement N/A Treatment Notes Wound #1 (Right, Lateral Malleolus) 1. Cleansed with: Clean wound with Normal Saline 2. Anesthetic Topical Lidocaine 4% cream to wound bed prior  to debridement 3. Peri-wound Care: Skin Prep 4. Dressing Applied: Prisma Ag 5. Secondary Dressing Applied Bordered Foam Dressing Wound #3 (Right, Plantar Foot) 1. Cleansed with: Clean wound with Normal Saline 2. Anesthetic Topical Lidocaine 4% cream to wound bed prior to debridement 3. Peri-wound Care: Skin Prep 4. Dressing Applied: Prisma Ag 5. Secondary Dressing Applied Bordered Foam Dressing Electronic Signature(s) Signed: 08/07/2016 4:48:02 PM By: Gretta Cool, BSN, RN, CWS, Kim RN, BSN Previous Signature: 08/07/2016 4:46:49 PM Version By: Gretta Cool, BSN, RN, CWS, Kim RN, BSN Previous Signature: 08/04/2016 1:57:44 PM Version By: Gretta Cool, BSN, RN, CWS, Kim RN, BSN Entered By: Gretta Cool, BSN, RN, CWS, Kim on 08/07/2016 16:48:02 [name] ([id number]) MANOLITO, JUREWICZ ([id number]) -------------------------------------------------------------------------------- [place] Details Patient Name: PHU, RECORD Date of Service: 08/04/2016 11:15 AM Medical Record Number: [id number] Patient Account Number: [id number] Date of Birth/Sex: 01-Mar-1940 (76 y.o. Male) Treating RN: Cornell Barman Primary Care Kelli Egolf: Enid Derry Other Clinician: Referring Ina Scrivens: Enid Derry Treating Nadiyah Zeis/Extender: Joaquim Lai  III, HOYT Weeks in Treatment: 21 Active Inactive ` Orientation to the Wound Care Program Nursing Diagnoses: Knowledge deficit related to the wound healing center program Goals: Patient/caregiver will verbalize understanding of the [facility] Program Date Initiated: 03/10/2016 Target Resolution Date: 03/23/2016 Goal Status: Active Interventions: Provide education on orientation to the wound center Notes: ` Soft Tissue Infection Nursing Diagnoses: Impaired tissue integrity Potential for infection: soft tissue Goals: Patient will remain free of wound infection Date Initiated: 03/10/2016 Target Resolution Date: 03/17/2016 Goal Status:  Active Interventions: Assess signs and symptoms of infection every visit Notes: ` Wound/Skin Impairment Nursing Diagnoses: Impaired tissue integrity MARL, SEAGO ([id number]) Goals: Ulcer/skin breakdown will heal within 14 weeks Date Initiated: 03/10/2016 Target Resolution Date: 03/24/2016 Goal Status: Active Interventions: Assess patient/caregiver ability to obtain necessary supplies Notes: Electronic Signature(s) Signed: 08/07/2016 4:46:21 PM By: Gretta Cool, BSN, RN, CWS, Kim RN, BSN Previous Signature: 08/04/2016 1:57:44 PM Version By: Gretta Cool, BSN, RN, CWS, Kim RN, BSN Entered By: Gretta Cool, BSN, RN, CWS, Kim on 08/07/2016 16:46:21 [name] ([id number]) -------------------------------------------------------------------------------- Pain Assessment Details Patient Name: ANTHONIO, MIZZELL. Date of Service: 08/04/2016 11:15 AM Medical Record Number: [id number] Patient Account Number: [id number] Date of Birth/Sex: [birth year] (76 y.o. Male) Treating RN: Cornell Barman Primary Care Amayrani Bennick: Enid Derry Other Clinician: Referring Shyvonne Chastang: Enid Derry Treating Tanesha Arambula/Extender: Melburn Hake, HOYT Weeks in Treatment: 21 Active Problems Location of Pain Severity and Description of Pain Patient Has Paino No Site Locations With Dressing Change: No Pain Management and Medication Current Pain Management: Goals for Pain Management Topical or injectable lidocaine is offered to patient for acute pain when surgical debridement is performed. If needed, Patient is instructed to use over the counter pain medication for the following 24-48 hours after debridement. Wound care MDs do not prescribed pain medications. Patient has chronic pain or uncontrolled pain. Patient has been instructed to make an appointment with their Primary Care Physician for pain management. Electronic Signature(s) Signed: 08/07/2016 4:22:54 PM By: Gretta Cool, BSN, RN, CWS, Kim RN, BSN Previous Signature: 08/04/2016 1:57:44 PM  Version By: Gretta Cool, BSN, RN, CWS, Kim RN, BSN Entered By: Gretta Cool, BSN, RN, CWS, Kim on 08/07/2016 16:22:54 [name] ([id number]) -------------------------------------------------------------------------------- Patient/Caregiver Education Details Patient Name: CRIAG, WICKLUND Date of Service: 08/04/2016 11:15 AM Medical Record Number: [id number] Patient Account Number: [id number] Date of Birth/Gender: 06-22-1940 (76 y.o. Male) Treating RN: Cornell Barman Primary Care Physician: Enid Derry Other Clinician: Referring Physician: Enid Derry Treating Physician/Extender: Sharalyn Ink in Treatment: 21 Education Assessment Education Provided To: Patient Education Topics Provided Wound/Skin Impairment: Handouts: Caring for Your Ulcer Methods: Demonstration Responses: State content correctly [employer]) Signed: 08/04/2016 1:57:44 PM By: Gretta Cool, BSN, RN, CWS, Kim RN, BSN Entered By: Gretta Cool, BSN, RN, CWS, Kim on 08/04/2016 11:54:00 [name] ([id number]) -------------------------------------------------------------------------------- Wound Assessment Details Patient Name: KELTIN, BAIRD. Date of Service: 08/04/2016 11:15 AM Medical Record Number: [id number] Patient Account Number: [id number] Date of Birth/Sex: 22-Oct-1940 (76 y.o. Male) Treating RN: Cornell Barman Primary Care Aftan Vint: Enid Derry Other Clinician: Referring Khailee Mick: Enid Derry Treating Shafer Swamy/Extender: Frann Rider in Treatment: 21 Wound Status Wound Number: 1 Primary Pressure Ulcer Etiology: Wound Location: Right Malleolus - Lateral Secondary Diabetic Wound/Ulcer of the Lower Wounding Event: Gradually Appeared Etiology: Extremity Date Acquired: 01/09/2016 Wound Open Weeks Of Treatment: 21 Status: Clustered Wound: No Comorbid Cataracts, Chronic sinus History: problems/congestion, Anemia, Sleep Apnea, Hypertension, Myocardial Infarction, Type II Diabetes,  Received Chemotherapy, Received Radiation Photos Wound Measurements Length: (cm) 0.6 Width: (cm)  0.7 Depth: (cm) 0.2 Area: (cm) 0.33 Volume: (cm) 0.066 % Reduction in Area: -39.8% % Reduction in Volume: -175% Epithelialization: None Wound Description 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 Slough/Fib Wound Margin: Flat and Intact Exudate Amount: Medium Exudate Type: Serous Exudate Color: amber After Cleansing: No rino Yes Wound Bed Granulation Amount: Large (67-100%) Exposed Structure Granulation Quality: Pink, Pale Fascia Exposed: No BREYER, TEJERA ([id number]) Necrotic Amount: None Present (0%) Fat Layer (Subcutaneous Tissue) Exposed: Yes Tendon Exposed: No Muscle Exposed: No Joint Exposed: No Bone Exposed: No Periwound Skin Texture Texture Color No Abnormalities Noted: No No Abnormalities Noted: No Induration: Yes Erythema: Yes Erythema Location: Circumferential Moisture Erythema Change: Decreased No Abnormalities Noted: No Dry / Scaly: No Temperature / Pain Maceration: No Temperature: No Abnormality Tenderness on Palpation: Yes Wound Preparation Ulcer Cleansing: Rinsed/Irrigated with Saline Topical Anesthetic Applied: Other: lidocaine 4%, Treatment Notes Wound #1 (Right, Lateral Malleolus) 1. Cleansed with: Clean wound with Normal Saline 2. Anesthetic Topical Lidocaine 4% cream to wound bed prior to debridement 3. Peri-wound Care: Skin Prep 4. Dressing Applied: Prisma Ag 5. Secondary Dressing Applied Bordered Foam Dressing Electronic Signature(s) Signed: 08/04/2016 1:57:44 PM By: Gretta Cool, BSN, RN, CWS, Kim RN, BSN Entered By: Gretta Cool, BSN, RN, CWS, Kim on 08/04/2016 11:38:36 [name] ([id number]) -------------------------------------------------------------------------------- Wound Assessment Details Patient Name: DAVONTE, SIEBENALER Date of Service: 08/04/2016 11:15 AM Medical Record Number:  [id number] Patient Account Number: [id number] Date of Birth/Sex: Jan 15, 1941 (76 y.o. Male) Treating RN: Cornell Barman Primary Care Lilah Mijangos: Enid Derry Other Clinician: Referring Jania Steinke: Enid Derry Treating Aerion Bagdasarian/Extender: Frann Rider in Treatment: 21 Wound Status Wound Number: 3 Primary Diabetic Wound/Ulcer of the Lower Etiology: Extremity Wound Location: Right Foot - Plantar Wound Open Wounding Event: Pressure Injury Status: Date Acquired: 06/15/2016 Comorbid Cataracts, Chronic sinus Weeks Of Treatment: 7 History: problems/congestion, Anemia, Sleep Clustered Wound: No Apnea, Hypertension, Myocardial Infarction, Type II Diabetes, Received Chemotherapy, Received Radiation Photos Wound Measurements Length: (cm) 0.7 Width: (cm) 0.9 Depth: (cm) 0.1 Area: (cm) 0.495 Volume: (cm) 0.049 % Reduction in Area: 72% % Reduction in Volume: 98.2% Epithelialization: None Wound Description Classification: Grade 1 Foul Odor Aft Wound Margin: Distinct, outline attached Slough/Fibrin Exudate Amount: Large Exudate Type: Serous Exudate Color: amber er Cleansing: No o Yes Wound Bed Granulation Amount: Small (1-33%) Exposed Structure Granulation Quality: Pink, Pale Fascia Exposed: No Necrotic Amount: Medium (34-66%) Fat Layer (Subcutaneous Tissue) Exposed: Yes Necrotic Quality: Adherent Slough Tendon Exposed: No Muscle Exposed: No LAZARO, ISENHOWER ([id number]) Joint Exposed: No Bone Exposed: No Periwound Skin Texture Texture Color No Abnormalities Noted: No No Abnormalities Noted: No Callus: Yes Atrophie Blanche: No Crepitus: No Cyanosis: No Excoriation: No Ecchymosis: No Induration: No Erythema: No Rash: No Hemosiderin Staining: No Scarring: No Mottled: No Pallor: No Moisture Rubor: No No Abnormalities Noted: No Dry / Scaly: No Temperature / Pain Maceration: Yes Temperature: No Abnormality Tenderness on Palpation: Yes Wound Preparation Ulcer  Cleansing: Rinsed/Irrigated with Saline Topical Anesthetic Applied: Other: lidocaine 4%, Treatment Notes Wound #3 (Right, Plantar Foot) 1. Cleansed with: Clean wound with Normal Saline 2. Anesthetic Topical Lidocaine 4% cream to wound bed prior to debridement 3. Peri-wound Care: Skin Prep 4. Dressing Applied: Prisma Ag 5. Secondary Dressing Applied Bordered Foam Dressing Electronic Signature(s) Signed: 08/04/2016 1:57:44 PM By: Gretta Cool, BSN, RN, CWS, Kim RN, BSN Entered By: Gretta Cool, BSN, RN, CWS, Kim on 08/04/2016 11:39:11 [name] ([id number]) -------------------------------------------------------------------------------- [place] Details Patient Name: [name] Date of Service: 08/04/2016 11:15  AM Medical Record Number: [id number] Patient Account Number: [id number] Date of Birth/Sex: 02/03/1941 (76 y.o. Male) Treating RN: Cornell Barman Primary Care Lunabella Badgett: Enid Derry Other Clinician: Referring Crickett Abbett: Enid Derry Treating Jaedin Regina/Extender: Melburn Hake, HOYT Weeks in Treatment: 21 Vital Signs Time Taken: 11:20 Temperature (F): 97.5 Height (in): 67 Pulse (bpm): 75 Weight (lbs): 136 Respiratory Rate (breaths/min): 16 Body Mass Index (BMI): 21.3 Blood Pressure (mmHg): 119/53 Reference Range: 80 - 120 mg / dl Electronic Signature(s) Signed: 08/07/2016 4:23:02 PM By: Gretta Cool, BSN, RN, CWS, Kim RN, BSN Previous Signature: 08/04/2016 1:57:44 PM Version By: Gretta Cool, BSN, RN, CWS, Kim RN, BSN Entered By: Gretta Cool, BSN, RN, CWS, Kim on 08/07/2016 16:23:02

## 2016-08-08 NOTE — Progress Notes (Signed)
[name], [name] ([id number]) Visit Report for 08/04/2016 Chief Complaint Document Details Patient Name: [name], [name]. Date of Service: 08/04/2016 11:15 AM Medical Record Number: [id number] Patient Account Number: [id number] Date of Birth/Sex: [birth year] (76 y.o. Male) Treating RN: Cornell Barman Primary Care Provider: Enid Derry Other Clinician: Referring Provider: Enid Derry Treating Provider/Extender: Melburn Hake, Gurshaan Matsuoka Weeks in Treatment: 21 Information Obtained from: Patient Chief Complaint Patient is at the clinic for treatment of an open pressure ulcer to the right lateral ankle Electronic Signature(s) Signed: 08/07/2016 4:48:26 PM By: Gretta Cool, BSN, RN, CWS, Kim RN, BSN Signed: 08/07/2016 5:09:57 PM By: Worthy Keeler PA-C Previous Signature: 08/04/2016 5:31:15 PM Version By: Worthy Keeler PA-C Entered By: Gretta Cool, BSN, RN, CWS, Kim on 08/07/2016 16:48:26 [name] ([id number]) -------------------------------------------------------------------------------- Debridement Details Patient Name: [name], [name]. Date of Service: 08/04/2016 11:15 AM Medical Record Number: [id number] Patient Account Number: [id number] Date of Birth/Sex: 01-09-1941 (76 y.o. Male) Treating RN: Cornell Barman Primary Care Provider: Enid Derry Other Clinician: Referring Provider: Enid Derry Treating Provider/Extender: Melburn Hake, Libertie Hausler Weeks in Treatment: 21 Debridement Performed for Wound #3 Right,Plantar Foot Assessment: Performed By: Physician STONE III, Eann Cleland E., PA-C Debridement: Debridement Severity of Tissue Pre Fat layer exposed Debridement: Pre-procedure Verification/Time Out No Taken: Start Time: 11:42 Pain Control: Other : lidocaine 4% Level: Skin/Subcutaneous Tissue Total Area Debrided (L x 0.7 (cm) x 0.9 (cm) = 0.63 (cm) W): Tissue and other Viable, Non-Viable, Subcutaneous material debrided: Instrument: Curette Bleeding: Minimum Hemostasis Achieved: Pressure End Time:  11:44 Procedural Pain: 0 Post Procedural Pain: 0 Response to Treatment: Procedure was tolerated well Post Debridement Measurements of Total Wound Length: (cm) 0.7 Width: (cm) 0.9 Depth: (cm) 0.2 Volume: (cm) 0.099 Character of Wound/Ulcer Post Requires Further Debridement Debridement: Severity of Tissue Post Debridement: Fat layer exposed Post Procedure Diagnosis Same as Pre-procedure Electronic Signature(s) Signed: 08/07/2016 4:47:03 PM By: Gretta Cool, BSN, RN, CWS, Kim RN, BSN Signed: 08/07/2016 5:09:57 PM By: Louisa Second ([id number]) Previous Signature: 08/04/2016 1:57:44 PM Version By: Gretta Cool, BSN, RN, CWS, Kim RN, BSN Entered By: Gretta Cool, BSN, RN, CWS, Kim on 08/07/2016 16:47:02 [name] ([id number]) -------------------------------------------------------------------------------- HPI Details Patient Name: [name], [name]. Date of Service: 08/04/2016 11:15 AM Medical Record Number: [id number] Patient Account Number: [id number] Date of Birth/Sex: 1940/03/24 (76 y.o. Male) Treating RN: Cornell Barman Primary Care Provider: Enid Derry Other Clinician: Referring Provider: Enid Derry Treating Provider/Extender: Melburn Hake, Bradden Tadros Weeks in Treatment: 21 History of Present Illness Location: Patient presents with an ulcer on the right lateral ankle. Quality: Patient reports experiencing a dull pain to affected area(s). Severity: Patient states wound are getting worse. Duration: Patient has had the wound for > 2 months prior to seeking treatment at the wound center Timing: Pain in wound is Intermittent (comes and goes Context: The wound appeared gradually over time Modifying Factors: Other treatment(s) tried include:local care as per the medical oncologist Associated Signs and Symptoms: Patient reports having:some pain and no discharge HPI Description: 76 year old patient has been referred by his medical oncologist Dr. Grayland Ormond, for a right lateral malleolus  ulcer has been there for several weeks. 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 He is currently on chemotherapy with FOLFOX and Herceptin. past medical history significant for diabetes mellitus, anemia, hypertension, sleep apnea, status post appendectomy, peripheral vascular catheterization( Port placement) in September 2017 by Dr. Delana Meyer. He is also receiving palliative radiation therapy and was seen  by Dr. Donella Stade. 03/17/2016 -- - x-ray of the right ankle -- IMPRESSION: 1. Soft tissue wound noted over the lateral malleolus, no underlying bony abnormality. No acute bony abnormality. 2. Diffuse degenerative change. 3. Peripheral vascular disease. 05/05/2016 -- the patient's wife tells me that she has noticed a small superficial bruise on the left lateral ankle and wanted me to view this area. 05/12/2016 -- the patient is unable to afford Santyl ointment and we have been struggling over the last 8 weeks to use various alternatives. I have asked them today if they would be willing to use the snap vacuum system and she will let me know soon. 05/19/2016 -- the patient and the wife have declined the use of the snap the vacuum system. He does not want any debridement to be done today. His proteins have improved a bit. 06/08/2016 -- . Not seen the patient back for 3 weeks and in the meanwhile the wife has decided to use duoderm on the wound,-- given to her by a friend. The wound is macerated and has increased in size due to the moisture. 06/16/2016 -- besides the original wound he had on his right lateral malleolus he has a small area on the dorsum of his right foot which is possibly cause due to pressure. He now has a plantar wound on the first metatarsal head on the right foot where his podiatrist Dr. Vickki Muff had debrided a callus yesterday. 08/04/16 on evaluation today patient wounds of the right lower extremity appear to be doing better. He  is tolerating the dressing's although there is some maceration on the lateral foot wound. Overall there's no evidence of infection. Electronic Signature(s) [name], [name] ([id number]) Signed: 08/07/2016 4:48:43 PM By: Gretta Cool BSN, RN, CWS, Kim RN, BSN Signed: 08/07/2016 5:09:57 PM By: Worthy Keeler PA-C Previous Signature: 08/04/2016 5:31:15 PM Version By: Worthy Keeler PA-C Entered By: Gretta Cool BSN, RN, CWS, Kim on 08/07/2016 16:48:42 [name] ([id number]) -------------------------------------------------------------------------------- Physical Exam Details Patient Name: [name], [name] Date of Service: 08/04/2016 11:15 AM Medical Record Number: [id number] Patient Account Number: [id number] Date of Birth/Sex: November 15, 1940 (76 y.o. Male) Treating RN: Cornell Barman Primary Care Provider: Enid Derry Other Clinician: Referring Provider: Enid Derry Treating Provider/Extender: Melburn Hake, Joss Mcdill Weeks in Treatment: 21 Constitutional Thin and well-hydrated in no acute distress. Respiratory normal breathing without difficulty. clear to auscultation bilaterally. Cardiovascular regular rate and rhythm with normal S1, S2. Psychiatric this patient is able to make decisions and demonstrates good insight into disease process. Alert and Oriented x 3. pleasant and cooperative. Notes The right lateral foot wound appears to have a good granulation bed without any complication there. There was some Slough noted in regard to the plantar foot wound that was liberated today and patient did tolerate this without complication. Electronic Signature(s) Signed: 08/04/2016 5:31:15 PM By: Worthy Keeler PA-C Entered By: Worthy Keeler on 08/04/2016 11:56:00 [name] ([id number]) -------------------------------------------------------------------------------- Physician Orders Details Patient Name: [name] Date of Service: 08/04/2016 11:15 AM Medical Record Number: [id number] Patient  Account Number: [id number] Date of Birth/Sex: [birth year] (76 y.o. Male) Treating RN: Cornell Barman Primary Care Provider: Enid Derry Other Clinician: Referring Provider: Enid Derry Treating Provider/Extender: Melburn Hake, Alaa Eyerman Weeks in Treatment: 52 Verbal / Phone Orders: No Diagnosis Coding ICD-10 Coding Code Description E11.621 Type 2 diabetes mellitus with foot ulcer L89.512 Pressure ulcer of right ankle, stage 2 L89.521 Pressure ulcer of left ankle, stage 1 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 Wound Cleansing Wound #1  Right,Lateral Malleolus o Clean wound with Normal Saline. o Cleanse wound with mild soap and water Wound #3 Right,Plantar Foot o Clean wound with Normal Saline. o Cleanse wound with mild soap and water Anesthetic Wound #1 Right,Lateral Malleolus o Topical Lidocaine 4% cream applied to wound bed prior to debridement Wound #3 Right,Plantar Foot o Topical Lidocaine 4% cream applied to wound bed prior to debridement Primary Wound Dressing Wound #1 Right,Lateral Malleolus o Prisma Ag Wound #3 Right,Plantar Foot o Prisma Ag Secondary Dressing Wound #1 Right,Lateral Malleolus o Boardered Foam Dressing Wound #3 Right,Plantar Foot o Boardered Foam Dressing [name], [name] ([id number]) Dressing Change Frequency Wound #1 Right,Lateral Malleolus o Change dressing every other day. Wound #3 Right,Plantar Foot o Change dressing every other day. Follow-up Appointments Wound #1 Right,Lateral Malleolus o Return Appointment in 1 week. Wound #3 Right,Plantar Foot o Return Appointment in 1 week. Edema Control Wound #1 Right,Lateral Malleolus o Elevate legs to the level of the heart and pump ankles as often as possible Off-Loading Wound #1 Right,Lateral Malleolus o Other: - Keep pressure off the malleolus Wound #3 Right,Plantar Foot o Other: - Keep pressure off the plantar foot Additional Orders / Instructions Wound #1  Right,Lateral Malleolus o Increase protein intake. o Activity as tolerated Medications-please add to medication list. Wound #1 Right,Lateral Malleolus o Other: - Include these in your diet...VITAMIN A, C, ZINC, MVI Notes Going to recommend that we switch to a silver collagen dressing for both wounds for the next week to see how this will do for him. Patient is in agreement with the plan as is his wife. He will also be having a skin graft application next week on the lateral malleolus wound. If anything worsens in the interim it will contact our office for additional recommendations. Electronic Signature(s) Signed: 08/07/2016 5:09:57 PM By: Worthy Keeler PA-C Signed: 08/07/2016 5:15:36 PM By: Gretta Cool BSN, RN, CWS, Kim RN, BSN Previous Signature: 08/04/2016 5:31:15 PM Version By: Louisa Second ([id number]) Entered By: Gretta Cool BSN, RN, CWS, Kim on 08/07/2016 16:49:06 [name] ([id number]) -------------------------------------------------------------------------------- Problem List Details Patient Name: [name], [name] Date of Service: 08/04/2016 11:15 AM Medical Record Number: [id number] Patient Account Number: [id number] Date of Birth/Sex: May 28, 1940 (76 y.o. 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 05/05/2016 Yes Inactive Problems Resolved Problems ICD-10 Code Description Active Date Resolved Date L89.521 Pressure ulcer of left ankle, stage 1 05/05/2016 05/05/2016 Electronic  Signature(s) Signed: 08/07/2016 4:47:48 PM By: Gretta Cool, BSN, RN, CWS, Kim RN, BSN Signed: 08/07/2016 5:09:57 PM By: Worthy Keeler PA-C Previous Signature: 08/04/2016 5:31:15 PM Version By: Worthy Keeler PA-C Entered By: Gretta Cool BSN, RN, CWS, Kim on 08/07/2016 16:47:47 [name] ([id number]) -------------------------------------------------------------------------------- Progress Note Details Patient Name: [name], [name]. Date of Service: 08/04/2016 11:15 AM Medical Record Number: [id number] Patient Account Number: [id number] Date of Birth/Sex: 1940-04-28 (76 y.o. Male) Treating RN: Cornell Barman Primary Care Provider: Enid Derry Other Clinician: Referring Provider: Enid Derry Treating Provider/Extender: Melburn Hake, Lynnea Vandervoort Weeks in Treatment: 21 Subjective Chief Complaint Information obtained from Patient Patient is at the clinic for treatment  of an open pressure ulcer to the right lateral ankle History of Present Illness (HPI) The following HPI elements were documented for the patient's wound: Location: Patient presents with an ulcer on the right lateral ankle. Quality: Patient reports experiencing a dull pain to affected area(s). Severity: Patient states wound are getting worse. Duration: Patient has had the wound for > 2 months prior to seeking treatment at the wound center Timing: Pain in wound is Intermittent (comes and goes Context: The wound appeared gradually over time Modifying Factors: Other treatment(s) tried include:local care as per the medical oncologist Associated Signs and Symptoms: Patient reports having:some pain and no discharge 76 year old patient has been referred by his medical oncologist Dr. Grayland Ormond, for a right lateral malleolus ulcer has been there for several weeks. 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 He is currently on chemotherapy with FOLFOX and Herceptin. past medical history significant  for diabetes mellitus, anemia, hypertension, sleep apnea, status post appendectomy, peripheral vascular catheterization( Port placement) in September 2017 by Dr. Delana Meyer. He is also receiving palliative radiation therapy and was seen by Dr. Donella Stade. 03/17/2016 -- - x-ray of the right ankle -- IMPRESSION: 1. Soft tissue wound noted over the lateral malleolus, no underlying bony abnormality. No acute bony abnormality. 2. Diffuse degenerative change. 3. Peripheral vascular disease. 05/05/2016 -- the patient's wife tells me that she has noticed a small superficial bruise on the left lateral ankle and wanted me to view this area. 05/12/2016 -- the patient is unable to afford Santyl ointment and we have been struggling over the last 8 weeks to use various alternatives. I have asked them today if they would be willing to use the snap vacuum system and she will let me know soon. 05/19/2016 -- the patient and the wife have declined the use of the snap the vacuum system. He does not want any debridement to be done today. His proteins have improved a bit. 06/08/2016 -- . Not seen the patient back for 3 weeks and in the meanwhile the wife has decided to use duoderm on the wound,-- given to her by a friend. The wound is macerated and has increased in size due to the moisture. 06/16/2016 -- besides the original wound he had on his right lateral malleolus he has a small area on the [name], [name]. ([id number]) dorsum of his right foot which is possibly cause due to pressure. He now has a plantar wound on the first metatarsal head on the right foot where his podiatrist Dr. Vickki Muff had debrided a callus yesterday. 08/04/16 on evaluation today patient wounds of the right lower extremity appear to be doing better. He is tolerating the dressing's although there is some maceration on the lateral foot wound. Overall there's no evidence of infection. Objective Constitutional Thin and well-hydrated in no acute  distress. Vitals Time Taken: 11:20 AM, Height: 67 in, Weight: 136 lbs, BMI: 21.3, Temperature: 97.5 F, Pulse: 75 bpm, Respiratory Rate: 16 breaths/min, Blood Pressure: 119/53 mmHg. Respiratory normal breathing without difficulty. clear to auscultation bilaterally. Cardiovascular regular rate and rhythm with normal S1, S2. Psychiatric this patient is able to make decisions and demonstrates good insight into disease process. Alert and Oriented x 3. pleasant and cooperative. General Notes: The right lateral foot wound appears to have a good granulation bed without any complication there. There was some Slough noted in regard to the plantar foot wound that was liberated today and patient did tolerate this without  complication. Integumentary (Hair, Skin) Wound #1 status is Open. Original cause of wound was Gradually Appeared. The wound is located on the Right,Lateral Malleolus. The wound measures 0.6cm length x 0.7cm width x 0.2cm depth; 0.33cm^2 area and 0.066cm^3 volume. There is Fat Layer (Subcutaneous Tissue) Exposed exposed. There is a medium amount of serous drainage noted. The wound margin is flat and intact. There is large (67-100%) pink, pale granulation within the wound bed. There is no necrotic tissue within the wound bed. The periwound skin appearance exhibited: Induration, Erythema. The periwound skin appearance did not exhibit: Dry/Scaly, Maceration. The surrounding wound skin color is noted with erythema which is circumferential. Periwound temperature was noted as No Abnormality. The periwound has tenderness on palpation. Wound #3 status is Open. Original cause of wound was Pressure Injury. The wound is located on the [place]. The wound measures 0.7cm length x 0.9cm width x 0.1cm depth; 0.495cm^2 area and 0.049cm^3 volume. There is Fat Layer (Subcutaneous Tissue) Exposed exposed. There is a large amount of serous drainage noted. The wound margin is distinct with the  outline attached to the wound base. There is small (1-33%) pink, pale granulation within the wound bed. There is a medium (34-66%) amount of RAWLIN, [name] ([id number]) necrotic tissue within the wound bed including Adherent Slough. The periwound skin appearance exhibited: Callus, Maceration. The periwound skin appearance did not exhibit: Crepitus, Excoriation, Induration, Rash, Scarring, Dry/Scaly, Atrophie Blanche, Cyanosis, Ecchymosis, Hemosiderin Staining, Mottled, Pallor, Rubor, Erythema. Periwound temperature was noted as No Abnormality. The periwound has tenderness on palpation. Assessment Active Problems ICD-10 E11.621 - Type 2 diabetes mellitus with foot ulcer L89.512 - Pressure ulcer of right ankle, stage 2 L89.521 - Pressure ulcer of left ankle, stage 1 E44.1 - Mild protein-calorie malnutrition Procedures Wound #3 Pre-procedure diagnosis of Wound #3 is a Diabetic Wound/Ulcer of the Lower Extremity located on the Right,Plantar Foot .Severity of Tissue Pre Debridement is: Fat layer exposed. There was a Skin/Subcutaneous Tissue Debridement ([id number]) debridement with total area of 0.63 sq cm performed by Christin Fudge, MD. with the following instrument(s): Curette to remove Viable and Non-Viable tissue/material including Subcutaneous after achieving pain control using Other (lidocaine 4%). A time out was not conducted prior to the start of the procedure. A Minimum amount of bleeding was controlled with Pressure. The procedure was tolerated well with a pain level of 0 throughout and a pain level of 0 following the procedure. Post Debridement Measurements: 0.7cm length x 0.9cm width x 0.2cm depth; 0.099cm^3 volume. Character of Wound/Ulcer Post Debridement requires further debridement. Severity of Tissue Post Debridement is: Fat layer exposed. Post procedure Diagnosis Wound #3: Same as Pre-Procedure Plan Wound Cleansing: Wound #1 Right,Lateral Malleolus: [name], FRONING  ([id number]) Clean wound with Normal Saline. Cleanse wound with mild soap and water Wound #3 Right,Plantar Foot: Clean wound with Normal Saline. Cleanse wound with mild soap and water Anesthetic: Wound #1 Right,Lateral Malleolus: Topical Lidocaine 4% cream applied to wound bed prior to debridement Wound #3 Right,Plantar Foot: Topical Lidocaine 4% cream applied to wound bed prior to debridement Primary Wound Dressing: Wound #1 Right,Lateral Malleolus: Prisma Ag Wound #3 Right,Plantar Foot: Prisma Ag Secondary Dressing: Wound #1 Right,Lateral Malleolus: Boardered Foam Dressing Wound #3 Right,Plantar Foot: Boardered Foam Dressing Dressing Change Frequency: Wound #1 Right,Lateral Malleolus: Change dressing every other day. Wound #3 Right,Plantar Foot: Change dressing every other day. Follow-up Appointments: Wound #1 Right,Lateral Malleolus: Return Appointment in 1 week. Wound #3 Right,Plantar Foot: Return Appointment in 1 week. Edema Control: Wound #  1 Right,Lateral Malleolus: Elevate legs to the level of the heart and pump ankles as often as possible Off-Loading: Wound #1 Right,Lateral Malleolus: Other: - Keep pressure off the malleolus Wound #3 Right,Plantar Foot: Other: - Keep pressure off the plantar foot Additional Orders / Instructions: Wound #1 Right,Lateral Malleolus: Increase protein intake. Activity as tolerated Medications-please add to medication list.: Wound #1 Right,Lateral Malleolus: Other: - Include these in your diet...VITAMIN A, C, ZINC, MVI General Notes: Going to recommend that we switch to a silver collagen dressing for both wounds for the next week to see how this will do for him. Patient is in agreement with the plan as is his wife. He will also be having a skin graft application next week on the lateral malleolus wound. If anything worsens in the interim it will contact our office for additional recommendations. LUM, STILLINGER  ([id number]) Electronic Signature(s) Signed: 08/04/2016 5:31:15 PM By: Worthy Keeler PA-C Entered By: Worthy Keeler on 08/04/2016 11:57:07 [name] ([id number]) -------------------------------------------------------------------------------- SuperBill Details Patient Name: [name] Date of Service: 08/04/2016 Medical Record Number: [id number] Patient Account Number: [id number] Date of Birth/Sex: [birth year] (76 y.o. Male) Treating RN: Cornell Barman Primary Care Provider: Enid Derry Other Clinician: Referring Provider: Enid Derry Treating Provider/Extender: Melburn Hake, [place] Anagnos Weeks in Treatment: 21 Diagnosis Coding ICD-10 Codes Code Description E11.621 Type 2 diabetes mellitus with foot ulcer L89.512 Pressure ulcer of right ankle, stage 2 L97.512 Non-pressure chronic ulcer of other part of right foot with fat layer exposed 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 Facility Procedures CPT4 Code Description: [id number] 11042 - DEB SUBQ TISSUE 20 SQ CM/< ICD-10 Description Diagnosis L97.512 Non-pressure chronic ulcer of other part of right fo Modifier: ot with fat la Quantity: 1 yer exposed Physician Procedures CPT4 Code Description: [id number] 11042 - WC PHYS SUBQ TISS 20 SQ CM ICD-10 Description Diagnosis L97.512 Non-pressure chronic ulcer of other part of right fo Modifier: ot with fat lay Quantity: 1 er exposed Electronic Signature(s) Signed: 08/04/2016 5:31:15 PM By: Worthy Keeler PA-C Entered By: Worthy Keeler on 08/04/2016 12:10:18

## 2016-08-09 DIAGNOSIS — F0390 Unspecified dementia without behavioral disturbance: Secondary | ICD-10-CM | POA: Diagnosis not present

## 2016-08-09 DIAGNOSIS — R634 Abnormal weight loss: Secondary | ICD-10-CM | POA: Diagnosis not present

## 2016-08-09 DIAGNOSIS — C155 Malignant neoplasm of lower third of esophagus: Secondary | ICD-10-CM | POA: Diagnosis not present

## 2016-08-09 DIAGNOSIS — D509 Iron deficiency anemia, unspecified: Secondary | ICD-10-CM | POA: Diagnosis not present

## 2016-08-09 DIAGNOSIS — R1310 Dysphagia, unspecified: Secondary | ICD-10-CM | POA: Diagnosis not present

## 2016-08-09 DIAGNOSIS — C787 Secondary malignant neoplasm of liver and intrahepatic bile duct: Secondary | ICD-10-CM | POA: Diagnosis not present

## 2016-08-11 ENCOUNTER — Encounter: Admitting: Surgery

## 2016-08-11 DIAGNOSIS — L89513 Pressure ulcer of right ankle, stage 3: Secondary | ICD-10-CM | POA: Diagnosis not present

## 2016-08-11 DIAGNOSIS — L97512 Non-pressure chronic ulcer of other part of right foot with fat layer exposed: Secondary | ICD-10-CM | POA: Diagnosis not present

## 2016-08-11 DIAGNOSIS — E11621 Type 2 diabetes mellitus with foot ulcer: Secondary | ICD-10-CM | POA: Diagnosis not present

## 2016-08-13 DIAGNOSIS — I10 Essential (primary) hypertension: Secondary | ICD-10-CM | POA: Diagnosis not present

## 2016-08-13 DIAGNOSIS — C787 Secondary malignant neoplasm of liver and intrahepatic bile duct: Secondary | ICD-10-CM | POA: Diagnosis not present

## 2016-08-13 DIAGNOSIS — S90421D Blister (nonthermal), right great toe, subsequent encounter: Secondary | ICD-10-CM | POA: Diagnosis not present

## 2016-08-13 DIAGNOSIS — Z7984 Long term (current) use of oral hypoglycemic drugs: Secondary | ICD-10-CM | POA: Diagnosis not present

## 2016-08-13 DIAGNOSIS — M1991 Primary osteoarthritis, unspecified site: Secondary | ICD-10-CM | POA: Diagnosis not present

## 2016-08-13 DIAGNOSIS — E119 Type 2 diabetes mellitus without complications: Secondary | ICD-10-CM | POA: Diagnosis not present

## 2016-08-13 DIAGNOSIS — C155 Malignant neoplasm of lower third of esophagus: Secondary | ICD-10-CM | POA: Diagnosis not present

## 2016-08-13 DIAGNOSIS — L89510 Pressure ulcer of right ankle, unstageable: Secondary | ICD-10-CM | POA: Diagnosis not present

## 2016-08-13 DIAGNOSIS — R634 Abnormal weight loss: Secondary | ICD-10-CM | POA: Diagnosis not present

## 2016-08-13 DIAGNOSIS — D509 Iron deficiency anemia, unspecified: Secondary | ICD-10-CM | POA: Diagnosis not present

## 2016-08-13 DIAGNOSIS — G629 Polyneuropathy, unspecified: Secondary | ICD-10-CM | POA: Diagnosis not present

## 2016-08-13 DIAGNOSIS — Z7982 Long term (current) use of aspirin: Secondary | ICD-10-CM | POA: Diagnosis not present

## 2016-08-13 DIAGNOSIS — R1310 Dysphagia, unspecified: Secondary | ICD-10-CM | POA: Diagnosis not present

## 2016-08-13 DIAGNOSIS — E039 Hypothyroidism, unspecified: Secondary | ICD-10-CM | POA: Diagnosis not present

## 2016-08-13 DIAGNOSIS — M810 Age-related osteoporosis without current pathological fracture: Secondary | ICD-10-CM | POA: Diagnosis not present

## 2016-08-13 DIAGNOSIS — M5134 Other intervertebral disc degeneration, thoracic region: Secondary | ICD-10-CM | POA: Diagnosis not present

## 2016-08-13 DIAGNOSIS — F0390 Unspecified dementia without behavioral disturbance: Secondary | ICD-10-CM | POA: Diagnosis not present

## 2016-08-13 NOTE — Progress Notes (Signed)
[name] ([id number]) Visit Report for 08/11/2016 Chief Complaint Document Details Patient Name: [name], [name]. Date of Service: 08/11/2016 11:15 AM Medical Record Number: [id number] Patient Account Number: [id number] Date of Birth/Sex: 1941-01-23 (76 y.o. Male) Treating RN: Baruch Gouty, RN, BSN, Velva Harman Primary Care Provider: Enid Derry Other Clinician: Referring Provider: Enid Derry Treating Provider/Extender: Frann Rider in Treatment: 22 Information Obtained from: Patient Chief Complaint Patient is at the clinic for treatment of an open pressure ulcer to the right lateral ankle Electronic Signature(s) Signed: 08/11/2016 12:05:40 PM By: Christin Fudge MD, FACS Entered By: Christin Fudge on 08/11/2016 12:05:40 [name] ([id number]) -------------------------------------------------------------------------------- Cellular or Tissue Based Product Details Patient Name: [name] Date of Service: 08/11/2016 11:15 AM Medical Record Number: [id number] Patient Account Number: [id number] Date of Birth/Sex: 04-20-1940 (76 y.o. Male) Treating RN: Baruch Gouty, RN, BSN, Velva Harman Primary Care Provider: Enid Derry Other Clinician: Referring Provider: Enid Derry Treating Provider/Extender: Frann Rider in Treatment: 22 Cellular or Tissue Based Wound #1 Right,Lateral Malleolus Product Type Applied to: Performed By: Physician Christin Fudge, MD Cellular or Tissue Based Grafix prime Product Type: Pre-procedure Yes - 11:48 Verification/Time Out Taken: Location: genitalia / hands / feet / multiple digits Wound Size (sq cm): 0.81 Product Size (sq cm): 2 Waste Size (sq cm): 0 Amount of Product Applied (sq cm): 2 Lot #: [id number] Order #: [id number] Expiration Date: 05/24/2019 Fenestrated: No Reconstituted: Yes Solution Type: saline Solution Amount: 76ml Lot #: [id number] Solution Expiration 04/14/2018 Date: Secured: Yes Secured With: Steri-Strips Dressing Applied: Yes Primary  Dressing: Mepitel Procedural Pain: 0 Post Procedural Pain: 0 Response to Treatment: Procedure was tolerated well Post Procedure Diagnosis Same as Pre-procedure Electronic Signature(s) Signed: 08/11/2016 12:04:57 PM By: Christin Fudge MD, FACS Entered By: Christin Fudge on 08/11/2016 12:04:57 [name] ([name], [name] ([id number]) -------------------------------------------------------------------------------- Debridement Details Patient Name: [name] Date of Service: 08/11/2016 11:15 AM Medical Record Number: [id number] Patient Account Number: [id number] Date of Birth/Sex: 09/02/1940 (76 y.o. Male) Treating RN: Baruch Gouty, RN, BSN, [place] Primary Care Provider: Enid Derry Other Clinician: Referring Provider: Enid Derry Treating Provider/Extender: Frann Rider in Treatment: 22 Debridement Performed for Wound #3 Right,Plantar Foot Assessment: Performed By: Physician Christin Fudge, MD Debridement: Debridement Severity of Tissue Pre Fat layer exposed Debridement: Pre-procedure Verification/Time Out Yes - 11:38 Taken: Start Time: 11:38 Pain Control: Lidocaine 4% Topical Solution Level: Skin/Subcutaneous Tissue Total Area Debrided (L x 0.7 (cm) x 0.9 (cm) = 0.63 (cm) W): Tissue and other Non-Viable, Callus, Fat, Fibrin/Slough, Subcutaneous material debrided: Instrument: Curette Bleeding: Minimum Hemostasis Achieved: Pressure End Time: 11:40 Procedural Pain: 0 Post Procedural Pain: 0 Response to Treatment: Procedure was tolerated well Post Debridement Measurements of Total Wound Length: (cm) 0.7 Width: (cm) 0.9 Depth: (cm) 0.1 Volume: (cm) 0.049 Character of Wound/Ulcer Post Requires Further Debridement Debridement: Severity of Tissue Post Debridement: Fat layer exposed Post Procedure Diagnosis Same as Pre-procedure Electronic Signature(s) Signed: 08/11/2016 12:05:06 PM By: Christin Fudge MD, FACS Signed: 08/11/2016 4:45:30 PM By:  Regan Lemming BSN, RN [name] ([id number]) Entered By: Christin Fudge on 08/11/2016 12:05:05 [name] ([id number]) -------------------------------------------------------------------------------- HPI Details Patient Name: [name] Date of Service: 08/11/2016 11:15 AM Medical Record Number: [id number] Patient Account Number: [id number] Date of Birth/Sex: [birth year] (76 y.o. Male) Treating RN: Baruch Gouty, RN, BSN, Velva Harman Primary Care Provider: Enid Derry Other Clinician: Referring Provider: Enid Derry Treating Provider/Extender: Frann Rider in Treatment: 22 History of Present Illness Location: Patient presents with an ulcer on  the right lateral ankle. Quality: Patient reports experiencing a dull pain to affected area(s). Severity: Patient states wound are getting worse. Duration: Patient has had the wound for > 2 months prior to seeking treatment at the wound center Timing: Pain in wound is Intermittent (comes and goes Context: The wound appeared gradually over time Modifying Factors: Other treatment(s) tried include:local care as per the medical oncologist Associated Signs and Symptoms: Patient reports having:some pain and no discharge HPI Description: 76 year old patient has been referred by his medical oncologist Dr. Grayland Ormond, for a right lateral malleolus ulcer has been there for several weeks. 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 He is currently on chemotherapy with FOLFOX and Herceptin. past medical history significant for diabetes mellitus, anemia, hypertension, sleep apnea, status post appendectomy, peripheral vascular catheterization( Port placement) in September 2017 by Dr. Delana Meyer. He is also receiving palliative radiation therapy and was seen by Dr. Donella Stade. 03/17/2016 -- - x-ray of the right ankle -- IMPRESSION: 1. Soft tissue wound noted over the lateral malleolus, no underlying bony  abnormality. No acute bony abnormality. 2. Diffuse degenerative change. 3. Peripheral vascular disease. 05/05/2016 -- the patient's wife tells me that she has noticed a small superficial bruise on the left lateral ankle and wanted me to view this area. 05/12/2016 -- the patient is unable to afford Santyl ointment and we have been struggling over the last 8 weeks to use various alternatives. I have asked them today if they would be willing to use the snap vacuum system and she will let me know soon. 05/19/2016 -- the patient and the wife have declined the use of the snap the vacuum system. He does not want any debridement to be done today. His proteins have improved a bit. 06/08/2016 -- . Not seen the patient back for 3 weeks and in the meanwhile the wife has decided to use duoderm on the wound,-- given to her by a friend. The wound is macerated and has increased in size due to the moisture. 06/16/2016 -- besides the original wound he had on his right lateral malleolus he has a small area on the dorsum of his right foot which is possibly cause due to pressure. He now has a plantar wound on the first metatarsal head on the right foot where his podiatrist Dr. Vickki Muff had debrided a callus yesterday. 08/04/16 on evaluation today patient wounds of the right lower extremity appear to be doing better. He is tolerating the dressing's although there is some maceration on the lateral foot wound. Overall there's no evidence of infection. 08/11/2016 -- he has had his first application of Grafix to the right lateral ankle [name], [name] ([id number]) Electronic Signature(s) Signed: 08/11/2016 12:06:01 PM By: Christin Fudge MD, FACS Entered By: Christin Fudge on 08/11/2016 12:06:00 [name] ([id number]) -------------------------------------------------------------------------------- Physical Exam Details Patient Name: [name] Date of Service: 08/11/2016 11:15 AM Medical Record Number:  [id number] Patient Account Number: [id number] Date of Birth/Sex: Apr 29, 1940 (76 y.o. Male) Treating RN: Baruch Gouty, RN, BSN, Velva Harman Primary Care Provider: Enid Derry Other Clinician: Referring Provider: Enid Derry Treating Provider/Extender: Frann Rider in Treatment: 22 Constitutional . Pulse regular. Respirations normal and unlabored. Afebrile. . Eyes Nonicteric. Reactive to light. Ears, Nose, Mouth, and Throat Lips, teeth, and gums WNL.Marland Kitchen Moist mucosa without lesions. Neck supple and nontender. No palpable supraclavicular or cervical adenopathy. Normal sized without goiter. Respiratory WNL. No retractions.. Cardiovascular Pedal Pulses WNL. No clubbing, cyanosis or  edema. Lymphatic No adneopathy. No adenopathy. No adenopathy. Musculoskeletal Adexa without tenderness or enlargement.. Digits and nails w/o clubbing, cyanosis, infection, petechiae, ischemia, or inflammatory conditions.. Integumentary (Hair, Skin) No suspicious lesions. No crepitus or fluctuance. No peri-wound warmth or erythema. No masses.Marland Kitchen Psychiatric Judgement and insight Intact.. No evidence of depression, anxiety, or agitation.. Notes the right plantar foot has a lot of maceration and slough and I have sharply removed this with a #3 curet and bleeding controlled with pressure. The right lateral ankle wound was washed out well with saline and all the superficial dressing material and debris was removed and after the bed was prepared I have applied a Grafix in the usual fashion and bolstered it in place Electronic Signature(s) Signed: 08/11/2016 12:07:31 PM By: Christin Fudge MD, FACS Entered By: Christin Fudge on 08/11/2016 12:07:27 [name] ([id number]) -------------------------------------------------------------------------------- Physician Orders Details Patient Name: [name] Date of Service: 08/11/2016 11:15 AM Medical Record Number: [id number] Patient Account Number: [id number] Date of  Birth/Sex: 1940-12-05 (76 y.o. Male) Treating RN: Baruch Gouty, RN, BSN, [place] Primary Care Provider: Enid Derry Other Clinician: Referring Provider: Enid Derry Treating Provider/Extender: Frann Rider in Treatment: 4 Verbal / Phone Orders: No Diagnosis Coding ICD-10 Coding Code Description E11.621 Type 2 diabetes mellitus with foot ulcer L89.512 Pressure ulcer of right ankle, stage 2 L97.512 Non-pressure chronic ulcer of other part of right foot with fat layer exposed 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 Wound Cleansing Wound #1 Right,Lateral Malleolus o Clean wound with Normal Saline. o Cleanse wound with mild soap and water Wound #3 Right,Plantar Foot o Clean wound with Normal Saline. o Cleanse wound with mild soap and water Anesthetic Wound #1 Right,Lateral Malleolus o Topical Lidocaine 4% cream applied to wound bed prior to debridement Wound #3 Right,Plantar Foot o Topical Lidocaine 4% cream applied to wound bed prior to debridement Primary Wound Dressing Wound #1 Right,Lateral Malleolus o Other: - Grafix 86mm prime Wound #3 Right,Plantar Foot o Aquacel Ag Secondary Dressing Wound #1 Right,Lateral Malleolus o Gauze and Kerlix/Conform Wound #3 Right,Plantar Foot o Boardered Foam Dressing [name], [name] ([id number]) Dressing Change Frequency Wound #1 Right,Lateral Malleolus o Change dressing every week - Only to be changed in the clinic Wound #3 Right,Plantar Foot o Change dressing every other day. Follow-up Appointments Wound #1 Right,Lateral Malleolus o Return Appointment in 1 week. Wound #3 Right,Plantar Foot o Return Appointment in 1 week. Edema Control Wound #1 Right,Lateral Malleolus o Elevate legs to the level of the heart and pump ankles as often as possible Off-Loading Wound #1 Right,Lateral Malleolus o Other: - Keep pressure off the malleolus Wound #3 Right,Plantar Foot o Other: - Keep pressure off the  plantar foot Additional Orders / Instructions Wound #1 Right,Lateral Malleolus o Increase protein intake. o Activity as tolerated Advanced Therapies Wound #1 Right,Lateral Malleolus o Grafix Prime application in clinic; including contact layer, fixation with steri strips, dry gauze and cover dressing. - 77mm applied by MD Wound #3 Right,Plantar Foot o Grafix Prime application in clinic; including contact layer, fixation with steri strips, dry gauze and cover dressing. - 49mm applied by MD Medications-please add to medication list. Wound #1 Right,Lateral Malleolus o Other: - Include these in your diet...VITAMIN A, C, ZINC, MVI Electronic Signature(s) [name], [name] ([id number]) Signed: 08/11/2016 4:14:17 PM By: Christin Fudge MD, FACS Signed: 08/11/2016 4:45:30 PM By: Regan Lemming BSN, RN Entered By: Regan Lemming on 08/11/2016 12:07:26 [name] ([id number]) -------------------------------------------------------------------------------- Problem List Details Patient Name: [name], [name] Date of Service:  08/11/2016 11:15 AM Medical Record Number: [id number] Patient Account Number: [id number] Date of Birth/Sex: 01-24-1941 (76 y.o. Male) Treating RN: Baruch Gouty, RN, BSN, Velva Harman Primary Care Provider: Enid Derry Other Clinician: Referring Provider: Enid Derry Treating Provider/Extender: Frann Rider in Treatment: 22 Active Problems ICD-10 Encounter Code Description Active Date Diagnosis E11.621 Type 2 diabetes mellitus with foot ulcer 05/05/2016 Yes L89.512 Pressure ulcer of right ankle, stage 2 05/05/2016 Yes L97.512 Non-pressure chronic ulcer of other part of right foot with 08/04/2016 Yes fat layer exposed 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 05/05/2016 Yes Inactive Problems Resolved Problems ICD-10 Code Description Active Date Resolved Date L89.521 Pressure ulcer of left ankle, stage 1 05/05/2016 05/05/2016 Electronic Signature(s) Signed: 08/11/2016  12:04:34 PM By: Christin Fudge MD, FACS Entered By: Christin Fudge on 08/11/2016 12:04:33 [name] ([id number]) -------------------------------------------------------------------------------- Progress Note Details Patient Name: [name] Date of Service: 08/11/2016 11:15 AM Medical Record Number: [id number] Patient Account Number: [id number] Date of Birth/Sex: December 08, 1940 (76 y.o. Male) Treating RN: Baruch Gouty, RN, BSN, Velva Harman Primary Care Provider: Enid Derry Other Clinician: Referring Provider: Enid Derry Treating Provider/Extender: Frann Rider in Treatment: 22 Subjective Chief Complaint Information obtained from Patient Patient is at the clinic for treatment of an open pressure ulcer to the right lateral ankle History of Present Illness (HPI) The following HPI elements were documented for the patient's wound: Location: Patient presents with an ulcer on the right lateral ankle. Quality: Patient reports experiencing a dull pain to affected area(s). Severity: Patient states wound are getting worse. Duration: Patient has had the wound for > 2 months prior to seeking treatment at the wound center Timing: Pain in wound is Intermittent (comes and goes Context: The wound appeared gradually over time Modifying Factors: Other treatment(s) tried include:local care as per the medical oncologist Associated Signs and Symptoms: Patient reports having:some pain and no discharge 76 year old patient has been referred by his medical oncologist Dr. Grayland Ormond, for a right lateral malleolus ulcer has been there for several weeks. 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 He is currently on chemotherapy with FOLFOX and Herceptin. past medical history significant for diabetes mellitus, anemia, hypertension, sleep apnea, status post appendectomy, peripheral vascular catheterization( Port placement) in September 2017 by Dr. Delana Meyer.  He is also receiving palliative radiation therapy and was seen by Dr. Donella Stade. 03/17/2016 -- - x-ray of the right ankle -- IMPRESSION: 1. Soft tissue wound noted over the lateral malleolus, no underlying bony abnormality. No acute bony abnormality. 2. Diffuse degenerative change. 3. Peripheral vascular disease. 05/05/2016 -- the patient's wife tells me that she has noticed a small superficial bruise on the left lateral ankle and wanted me to view this area. 05/12/2016 -- the patient is unable to afford Santyl ointment and we have been struggling over the last 8 weeks to use various alternatives. I have asked them today if they would be willing to use the snap vacuum system and she will let me know soon. 05/19/2016 -- the patient and the wife have declined the use of the snap the vacuum system. He does not want any debridement to be done today. His proteins have improved a bit. 06/08/2016 -- . Not seen the patient back for 3 weeks and in the meanwhile the wife has decided to use duoderm on the wound,-- given to her by a friend. The wound is macerated and has increased in size due to the moisture. 06/16/2016 -- besides the original wound he had on  his right lateral malleolus he has a small area on the [name], [name]. ([id number]) dorsum of his right foot which is possibly cause due to pressure. He now has a plantar wound on the first metatarsal head on the right foot where his podiatrist Dr. Vickki Muff had debrided a callus yesterday. 08/04/16 on evaluation today patient wounds of the right lower extremity appear to be doing better. He is tolerating the dressing's although there is some maceration on the lateral foot wound. Overall there's no evidence of infection. 08/11/2016 -- he has had his first application of Grafix to the right lateral ankle Allergies sufa drugs, Cephalosporins, lisinopril, losartin, Keflex, amoxicillin, ampicillin, Septra, Bactrim Objective Constitutional Pulse regular.  Respirations normal and unlabored. Afebrile. Vitals Time Taken: 11:17 AM, Height: 67 in, Weight: 136 lbs, BMI: 21.3, Temperature: 97.5 F, Pulse: 79 bpm, Respiratory Rate: 16 breaths/min, Blood Pressure: 140/57 mmHg. Eyes Nonicteric. Reactive to light. Ears, Nose, Mouth, and Throat Lips, teeth, and gums WNL.Marland Kitchen Moist mucosa without lesions. Neck supple and nontender. No palpable supraclavicular or cervical adenopathy. Normal sized without goiter. Respiratory WNL. No retractions.. Cardiovascular Pedal Pulses WNL. No clubbing, cyanosis or edema. Lymphatic No adneopathy. No adenopathy. No adenopathy. Musculoskeletal Adexa without tenderness or enlargement.. Digits and nails w/o clubbing, cyanosis, infection, petechiae, ischemia, or inflammatory conditions.[name] ([id number]) Psychiatric Judgement and insight Intact.. No evidence of depression, anxiety, or agitation.. General Notes: the right plantar foot has a lot of maceration and slough and I have sharply removed this with a #3 curet and bleeding controlled with pressure. The right lateral ankle wound was washed out well with saline and all the superficial dressing material and debris was removed and after the bed was prepared I have applied a Grafix in the usual fashion and bolstered it in place Integumentary (Hair, Skin) No suspicious lesions. No crepitus or fluctuance. No peri-wound warmth or erythema. No masses.. Wound #1 status is Open. Original cause of wound was Gradually Appeared. The wound is located on the Right,Lateral Malleolus. The wound measures 0.9cm length x 0.9cm width x 0.2cm depth; 0.636cm^2 area and 0.127cm^3 volume. There is Fat Layer (Subcutaneous Tissue) Exposed exposed. There is no tunneling or undermining noted. There is a medium amount of serous drainage noted. The wound margin is flat and intact. There is small (1-33%) pink, pale granulation within the wound bed. There is a large (67-100%) amount of  necrotic tissue within the wound bed including Adherent Slough. The periwound skin appearance exhibited: Induration, Maceration, Erythema. The periwound skin appearance did not exhibit: Dry/Scaly. The surrounding wound skin color is noted with erythema which is circumferential. Periwound temperature was noted as No Abnormality. The periwound has tenderness on palpation. Wound #3 status is Open. Original cause of wound was Pressure Injury. The wound is located on the [place]. The wound measures 0.7cm length x 0.9cm width x 0.1cm depth; 0.495cm^2 area and 0.049cm^3 volume. There is Fat Layer (Subcutaneous Tissue) Exposed exposed. There is no tunneling or undermining noted. There is a large amount of serous drainage noted. The wound margin is distinct with the outline attached to the wound base. There is small (1-33%) pink, pale granulation within the wound bed. There is a medium (34-66%) amount of necrotic tissue within the wound bed including Adherent Slough. The periwound skin appearance exhibited: Callus, Maceration. The periwound skin appearance did not exhibit: Crepitus, Excoriation, Induration, Rash, Scarring, Dry/Scaly, Atrophie Blanche, Cyanosis, Ecchymosis, Hemosiderin Staining, Mottled, Pallor, Rubor, Erythema. Periwound temperature was noted as No Abnormality. The periwound  has tenderness on palpation. Assessment Active Problems ICD-10 E11.621 - Type 2 diabetes mellitus with foot ulcer L89.512 - Pressure ulcer of right ankle, stage 2 L97.512 - Non-pressure chronic ulcer of other part of right foot with fat layer exposed E44.1 - Mild protein-calorie malnutrition [name], [name] ([id number]) Procedures Wound #3 Pre-procedure diagnosis of Wound #3 is a Diabetic Wound/Ulcer of the Lower Extremity located on the [place] .Severity of Tissue Pre Debridement is: Fat layer exposed. There was a Skin/Subcutaneous Tissue Debridement ([id number]) debridement with total  area of 0.63 sq cm performed by Christin Fudge, MD. with the following instrument(s): Curette to remove Non-Viable tissue/material including Fat Layer (and Subcutaneous Tissue) Exposed, Fibrin/Slough, Callus, and Subcutaneous after achieving pain control using Lidocaine 4% Topical Solution. A time out was conducted at 11:38, prior to the start of the procedure. A Minimum amount of bleeding was controlled with Pressure. The procedure was tolerated well with a pain level of 0 throughout and a pain level of 0 following the procedure. Post Debridement Measurements: 0.7cm length x 0.9cm width x 0.1cm depth; 0.049cm^3 volume. Character of Wound/Ulcer Post Debridement requires further debridement. Severity of Tissue Post Debridement is: Fat layer exposed. Post procedure Diagnosis Wound #3: Same as Pre-Procedure Wound #1 Pre-procedure diagnosis of Wound #1 is a Pressure Ulcer located on the Right,Lateral Malleolus. A skin graft procedure using a bioengineered skin substitute/cellular or tissue based product was performed by Christin Fudge, MD. Grafix prime was applied and secured with Steri-Strips. 2 sq cm of product was utilized and 0 sq cm was wasted. Post Application, Mepitel was applied. A Time Out was conducted at 11:48, prior to the start of the procedure. The procedure was tolerated well with a pain level of 0 throughout and a pain level of 0 following the procedure. Post procedure Diagnosis Wound #1: Same as Pre-Procedure . Plan Wound Cleansing: Wound #1 Right,Lateral Malleolus: Clean wound with Normal Saline. Cleanse wound with mild soap and water Wound #3 Right,Plantar Foot: Clean wound with Normal Saline. Cleanse wound with mild soap and water Anesthetic: Wound #1 Right,Lateral Malleolus: Topical Lidocaine 4% cream applied to wound bed prior to debridement Wound #3 Right,Plantar Foot: Topical Lidocaine 4% cream applied to wound bed prior to debridement Primary Wound Dressing: [name], [name] ([id number]) Wound #1 Right,Lateral Malleolus: Other: - Grafix 35mm prime Wound #3 Right,Plantar Foot: Aquacel Ag Secondary Dressing: Wound #1 Right,Lateral Malleolus: Gauze and Kerlix/Conform Wound #3 Right,Plantar Foot: Boardered Foam Dressing Dressing Change Frequency: Wound #1 Right,Lateral Malleolus: Change dressing every week - Only to be changed in the clinic Wound #3 Right,Plantar Foot: Change dressing every other day. Follow-up Appointments: Wound #1 Right,Lateral Malleolus: Return Appointment in 1 week. Wound #3 Right,Plantar Foot: Return Appointment in 1 week. Edema Control: Wound #1 Right,Lateral Malleolus: Elevate legs to the level of the heart and pump ankles as often as possible Off-Loading: Wound #1 Right,Lateral Malleolus: Other: - Keep pressure off the malleolus Wound #3 Right,Plantar Foot: Other: - Keep pressure off the plantar foot Additional Orders / Instructions: Wound #1 Right,Lateral Malleolus: Increase protein intake. Activity as tolerated Advanced Therapies: Wound #1 Right,Lateral Malleolus: Grafix Prime application in clinic; including contact layer, fixation with steri strips, dry gauze and cover dressing. - 49mm applied by MD Wound #3 Right,Plantar Foot: Grafix Prime application in clinic; including contact layer, fixation with steri strips, dry gauze and cover dressing. - 41mm applied by MD Medications-please add to medication list.: Wound #1 Right,Lateral Malleolus: Other: - Include these in your diet...VITAMIN A,  C, ZINC, MVI the patient's wounds were reviewed and I have recommended: 1. Appropriate bolster dressing applied to his right ankle and this is to be kept intact for the week [name], [name]. ([id number]) 2. silver alginate with an offloading felt to the plantar aspect of his right foot. the wound was too macerated with collagen dressing 3. we will arrange for a Grafix to be applied to his right lateral ankle next  week He will come back and see as next week Electronic Signature(s) Signed: 08/11/2016 12:09:36 PM By: Christin Fudge MD, FACS Entered By: Christin Fudge on 08/11/2016 12:09:36 [name] ([id number]) -------------------------------------------------------------------------------- [place] Details Patient Name: [name] Date of Service: 08/11/2016 Medical Record Number: [id number] Patient Account Number: [id number] Date of Birth/Sex: 1940/09/27 (76 y.o. Male) Treating RN: Baruch Gouty, RN, BSN, [place] Primary Care Provider: Enid Derry Other Clinician: Referring Provider: Enid Derry Treating Provider/Extender: Frann Rider in Treatment: 22 Diagnosis Coding ICD-10 Codes Code Description E11.621 Type 2 diabetes mellitus with foot ulcer L89.512 Pressure ulcer of right ankle, stage 2 L97.512 Non-pressure chronic ulcer of other part of right foot with fat layer exposed 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 Facility Procedures CPT4 Code Description: 03474259 (Facility Use Only) Grafix Prime 2x3 Sq SQ CM Modifier: Quantity: 2 CPT4 Code Description: 56387564 15275 - SKIN SUB GRAFT FACE/NK/HF/G ICD-10 Description Diagnosis E11.621 Type 2 diabetes mellitus with foot ulcer L89.512 Pressure ulcer of right ankle, stage 2 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 Modifier: Quantity: 1 CPT4 Code Description: 33295188 11042 - DEB SUBQ TISSUE 20 SQ CM/< ICD-10 Description Diagnosis E11.621 Type 2 diabetes mellitus with foot ulcer L97.512 Non-pressure chronic ulcer of other part of right foo 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

## 2016-08-14 ENCOUNTER — Encounter: Attending: Surgery | Admitting: Surgery

## 2016-08-14 DIAGNOSIS — L89512 Pressure ulcer of right ankle, stage 2: Secondary | ICD-10-CM | POA: Insufficient documentation

## 2016-08-14 DIAGNOSIS — L97512 Non-pressure chronic ulcer of other part of right foot with fat layer exposed: Secondary | ICD-10-CM | POA: Diagnosis not present

## 2016-08-14 DIAGNOSIS — Z880 Allergy status to penicillin: Secondary | ICD-10-CM | POA: Diagnosis not present

## 2016-08-14 DIAGNOSIS — Z882 Allergy status to sulfonamides status: Secondary | ICD-10-CM | POA: Insufficient documentation

## 2016-08-14 DIAGNOSIS — D649 Anemia, unspecified: Secondary | ICD-10-CM | POA: Insufficient documentation

## 2016-08-14 DIAGNOSIS — G4730 Sleep apnea, unspecified: Secondary | ICD-10-CM | POA: Insufficient documentation

## 2016-08-14 DIAGNOSIS — I10 Essential (primary) hypertension: Secondary | ICD-10-CM | POA: Diagnosis not present

## 2016-08-14 DIAGNOSIS — E11621 Type 2 diabetes mellitus with foot ulcer: Secondary | ICD-10-CM | POA: Diagnosis not present

## 2016-08-14 DIAGNOSIS — C155 Malignant neoplasm of lower third of esophagus: Secondary | ICD-10-CM | POA: Insufficient documentation

## 2016-08-14 DIAGNOSIS — E441 Mild protein-calorie malnutrition: Secondary | ICD-10-CM | POA: Insufficient documentation

## 2016-08-14 DIAGNOSIS — L89513 Pressure ulcer of right ankle, stage 3: Secondary | ICD-10-CM | POA: Diagnosis not present

## 2016-08-14 NOTE — Progress Notes (Addendum)
TYLIEK, TIMBERMAN ([id number]) Visit Report for 08/11/2016 Allergy List Details Patient Name: [name], [name]. Date of Service: 08/11/2016 11:15 AM Medical Record Number: [id number] Patient Account Number: [id number] Date of Birth/Sex: 07/29/1940 (76 y.o. Male) Treating RN: Baruch Gouty, RN, BSN, Velva Harman Primary Care Eion Timbrook: Enid Derry Other Clinician: Referring Malaki Koury: Enid Derry Treating Nakoma Gotwalt/Extender: Frann Rider in Treatment: 22 Allergies Active Allergies sufa drugs Cephalosporins lisinopril losartin Keflex amoxicillin ampicillin Septra Bactrim Allergy Notes Electronic Signature(s) Signed: 08/11/2016 11:12:39 AM By: Regan Lemming BSN, RN Entered By: Regan Lemming on 08/11/2016 11:12:38 [name] ([id number]) -------------------------------------------------------------------------------- [place] Details Patient Name: [name] Date of Service: 08/11/2016 11:15 AM Medical Record Number: [id number] Patient Account Number: [id number] Date of Birth/Sex: 1940/04/09 (76 y.o. Male) Treating RN: Baruch Gouty, RN, BSN, [place] Primary Care Deshonda Cryderman: Enid Derry Other Clinician: Referring Fernand Sorbello: Enid Derry Treating Carlea Badour/Extender: Frann Rider in Treatment: 22 Visit Information Patient Arrived: Ambulatory Arrival Time: 11:16 Accompanied By: wife Transfer Assistance: None Patient Identification Verified: Yes Secondary Verification Process Yes Completed: Patient Requires Transmission- No Based Precautions: Patient Has Alerts: Yes Patient Alerts: Patient on Blood Thinner DM II 81mg  aspirin twice daily History Since Last Visit All ordered tests and consults were completed: No Added or deleted any medications: No Any new allergies or adverse reactions: No Had a fall or experienced change in activities of daily living that may affect risk of falls: No Signs or symptoms of abuse/neglect since last visito No Hospitalized since last  visit: No Has Dressing in Place as Prescribed: Yes Electronic Signature(s) Signed: 08/11/2016 4:45:30 PM By: Regan Lemming BSN, RN Entered By: Regan Lemming on 08/11/2016 11:17:05 [name] ([id number]) -------------------------------------------------------------------------------- Encounter Discharge Information Details Patient Name: [name] Date of Service: 08/11/2016 11:15 AM Medical Record Number: [id number] Patient Account Number: [id number] Date of Birth/Sex: 14-Feb-1940 (76 y.o. Male) Treating RN: Baruch Gouty, RN, BSN, Velva Harman Primary Care Shery Waun[place]a: Enid Derry Other Clinician: Referring Kinnedy Mongiello: Enid Derry Treating Kawanda Drumheller/Extender: Frann Rider in Treatment: 22 Encounter Discharge Information Items Schedule Follow-up Appointment: No Medication Reconciliation completed No and provided to Patient/Care Lisamarie Coke: Provided on Clinical Summary of Care: 08/11/2016 Form Type Recipient Paper Patient FW Electronic Signature(s) Signed: 08/11/2016 12:08:18 PM By: Ruthine Dose Entered By: Ruthine Dose on 08/11/2016 12:08:18 [name] ([id number]) -------------------------------------------------------------------------------- Lower Extremity Assessment Details Patient Name: [name] Date of Service: 08/11/2016 11:15 AM Medical Record Number: [id number] Patient Account Number: [id number] Date of Birth/Sex: 06/18/1940 (76 y.o. Male) Treating RN: Baruch Gouty, RN, BSN, [place] Har[place] Primary Care Mclain Freer: Enid Derry Other Clinician: Referring Malessa Zartman: Enid Derry Treating Laurrie Toppin/Extender: Frann Rider in Treatment: 22 Vascular Assessment Claudication: Claudication Assessment [Right:None] Pulses: Dorsalis Pedis Palpable: [Right:Yes] Posterior Tibial Extremity colors, hair growth, and conditions: Extremity Color: [Right:Normal] Hair Growth on Extremity: [Right:Yes] Temperature of Extremity: [Right:Warm] Capillary Refill: [Right:< 3  seconds] Electronic Signature(s) Signed: 08/11/2016 4:45:30 PM By: Regan Lemming BSN, RN Entered By: Regan Lemming on 08/11/2016 11:25:21 [name] ([id number]) -------------------------------------------------------------------------------- Multi Wound Chart Details Patient Name: [name] Date of Service: 08/11/2016 11:15 AM Medical Record Number: [id number] Patient Account Number: [id number] Date of Birth/Sex: 06/14/1940 (76 y.o. Male) Treating RN: Baruch Gouty, RN, BSN, Velva Harman Primary Care Tynisa Vohs: Enid Derry Other Clinician: Referring Lauri Till: Enid Derry Treating Shreyan Hinz/Extender: Frann Rider in Treatment: 22 Vital Signs Height(in): 67 Pulse(bpm): 79 Weight(lbs): 136 Blood Pressure 140/57 (mmHg): Body Mass Index(BMI): 21 Temperature(F): 97.5 Respiratory Rate 16 (breaths/min): Photos: [1:No Photos] [3:No Photos] [N/A:N/A] Wound Location: [1:Right Malleolus - Lateral] [3:Right Foot -  Plantar] [N/A:N/A] Wounding Event: [1:Gradually Appeared] [3:Pressure Injury] [N/A:N/A] Primary Etiology: [1: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] [N/A:N/A] Secondary Etiology: [1:Diabetic Wound/Ulcer of the Lower Extremity] [3:N/A] [N/A:N/A] Comorbid History: [1:Cataracts, Chronic sinus problems/congestion, Anemia, Sleep Apnea, Hypertension, Myocardial Infarction, Type II Diabetes, Received Chemotherapy, Received Radiation] [3:Cataracts, Chronic sinus problems/congestion, Anemia, Sleep  Apnea, Hypertension, Myocardial Infarction, Type II Diabetes, Received Chemotherapy, Received Radiation] [N/A:N/A] Date Acquired: [1:01/09/2016] [3:06/15/2016] [N/A:N/A] Weeks of Treatment: [1:22] [3:8] [N/A:N/A] Wound Status: [1:Open] [3:Open] [N/A:N/A] Measurements L x W x D 0.9x0.9x0.2 [3:0.7x0.9x0.1] [N/A:N/A] (cm) Area (cm) : [1:0.636] [3:0.495] [N/A:N/A] Volume (cm) : [1:0.127] [3:0.049] [N/A:N/A] % Reduction in Area: [1:-169.50%] [3:72.00%] [N/A:N/A] %  Reduction in Volume: -429.20% [3:98.20%] [N/A:N/A] Classification: [1:Category/Stage III] [3:Grade 1] [N/A:N/A] HBO Classification: [1:Grade 1] [3:N/A] [N/A:N/A] Exudate Amount: [1:Medium] [3:Large] [N/A:N/A] Exudate Type: [1:Serous] [3:Serous] [N/A:N/A] Exudate Color: [1:amber] [3:amber] [N/A:N/A] Wound Margin: [1:Flat and Intact] [3:Distinct, outline attached] [N/A:N/A] Granulation Amount: Small (1-33%) Small (1-33%) N/A Granulation Quality: Pink, Pale Pink, Pale N/A Necrotic Amount: Large (67-100%) Medium (34-66%) N/A Exposed Structures: Fat Layer (Subcutaneous Fat Layer (Subcutaneous N/A Tissue) Exposed: Yes Tissue) Exposed: Yes Fascia: No Fascia: No Tendon: No Tendon: No Muscle: No Muscle: No Joint: No Joint: No Bone: No Bone: No Epithelialization: None None N/A Debridement: N/A Debridement ([id number]- N/A 11047) Pre-procedure N/A 11:38 N/A Verification/Time Out Taken: Pain Control: N/A Lidocaine 4% Topical N/A Solution Tissue Debrided: N/A Fibrin/Slough, Fat, Callus, N/A Subcutaneous Level: N/A Skin/Subcutaneous N/A Tissue Debridement Area (sq N/A 0.63 N/A cm): Instrument: N/A Curette N/A Bleeding: N/A Minimum N/A Hemostasis Achieved: N/A Pressure N/A Procedural Pain: N/A 0 N/A Post Procedural Pain: N/A 0 N/A Debridement Treatment N/A Procedure was tolerated N/A Response: well Post Debridement N/A 0.7x0.9x0.1 N/A Measurements L x W x D (cm) Post Debridement N/A 0.049 N/A Volume: (cm) Periwound Skin Texture: Induration: Yes Callus: Yes N/A Excoriation: No Induration: No Crepitus: No Rash: No Scarring: No Periwound Skin Maceration: Yes Maceration: Yes N/A Moisture: Dry/Scaly: No Dry/Scaly: No Periwound Skin Color: Erythema: Yes Atrophie Blanche: No N/A Cyanosis: No Ecchymosis: No Erythema: No Hemosiderin Staining: No Mottled: No [name], [name] ([id number]) Pallor: No Rubor: No Erythema Location: Circumferential N/A N/A Erythema Change:  Decreased N/A N/A Temperature: No Abnormality No Abnormality N/A Tenderness on Yes Yes N/A Palpation: Wound Preparation: Ulcer Cleansing: Ulcer Cleansing: N/A Rinsed/Irrigated with Rinsed/Irrigated with Saline Saline Topical Anesthetic Topical Anesthetic Applied: Other: lidocaine Applied: Other: lidocaine 4% 4% Procedures Performed: Cellular or Tissue Based Debridement N/A Product Treatment Notes Electronic Signature(s) Signed: 08/11/2016 12:04:40 PM By: Christin Fudge MD, FACS Entered By: Christin Fudge on 08/11/2016 12:04:39 [name] ([id number]) -------------------------------------------------------------------------------- [place] Details Patient Name: [name], [name] Date of Service: 08/11/2016 11:15 AM Medical Record Number: [id number] Patient Account Number: [id number] Date of Birth/Sex: November 21, 1940 (76 y.o. Male) Treating RN: Baruch Gouty, RN, BSN, Velva Harman Primary Care Guiliana Shor: Enid Derry Other Clinician: Referring Shonika Kolasinski: Enid Derry Treating Gaylan Fauver/Extender: Frann Rider in Treatment: 22 Active Inactive ` Orientation to the Wound Care Program Nursing Diagnoses: Knowledge deficit related to the wound healing center program Goals: Patient/caregiver will verbalize understanding of the [place] Program Date Initiated: 03/10/2016 Target Resolution Date: 03/23/2016 Goal Status: Active Interventions: Provide education on orientation to the wound center Notes: ` Soft Tissue Infection Nursing Diagnoses: Impaired tissue integrity Potential for infection: soft tissue Goals: Patient will remain free of wound infection Date Initiated: 03/10/2016 Target Resolution Date: 03/17/2016 Goal Status: Active Interventions: Assess signs and symptoms of infection every visit Notes: ` Wound/Skin  Impairment Nursing Diagnoses: Impaired tissue integrity [name], [name] ([id number]) Goals: Ulcer/skin breakdown will heal within 14  weeks Date Initiated: 03/10/2016 Target Resolution Date: 03/24/2016 Goal Status: Active Interventions: Assess patient/caregiver ability to obtain necessary supplies Notes: Electronic Signature(s) Signed: 08/11/2016 4:45:30 PM By: Regan Lemming BSN, RN Entered By: Regan Lemming on 08/11/2016 11:30:29 [name] ([id number]) -------------------------------------------------------------------------------- Pain Assessment Details Patient Name: [name] Date of Service: 08/11/2016 11:15 AM Medical Record Number: [id number] Patient Account Number: [id number] Date of Birth/Sex: [birth year] (76 y.o. Male) Treating RN: Baruch Gouty, RN, BSN, Velva Harman Primary Care Kareen Hitsman: Enid Derry Other Clinician: Referring Jayleah Garbers: Enid Derry Treating Charletha Dalpe/Extender: Frann Rider in Treatment: 22 Active Problems Location of Pain Severity and Description of Pain Patient Has Paino No Site Locations With Dressing Change: No Pain Management and Medication Current Pain Management: Electronic Signature(s) Signed: 08/11/2016 4:45:30 PM By: Regan Lemming BSN, RN Entered By: Regan Lemming on 08/11/2016 11:17:16 [name] ([id number]) -------------------------------------------------------------------------------- Wound Assessment Details Patient Name: [name] Date of Service: 08/11/2016 11:15 AM Medical Record Number: [id number] Patient Account Number: [id number] Date of Birth/Sex: [birth year] (76 y.o. Male) Treating RN: Baruch Gouty, RN, BSN, [place] Primary Care Jerlean Peralta: Enid Derry Other Clinician: Referring Dyllen Menning: Enid Derry Treating Parv Manthey/Extender: Frann Rider in Treatment: 22 Wound Status Wound Number: 1 Primary Pressure Ulcer Etiology: Wound Location: Right Malleolus - Lateral Secondary Diabetic Wound/Ulcer of the Lower Wounding Event: Gradually Appeared Etiology: Extremity Date Acquired: 01/09/2016 Wound Open Weeks Of Treatment: 22 Status: Clustered Wound:  No Comorbid Cataracts, Chronic sinus History: problems/congestion, Anemia, Sleep Apnea, Hypertension, Myocardial Infarction, Type II Diabetes, Received Chemotherapy, Received Radiation Photos Photo Uploaded By: Regan Lemming on 08/11/2016 15:24:56 Wound Measurements Length: (cm) 0.9 Width: (cm) 0.9 Depth: (cm) 0.2 Area: (cm) 0.636 Volume: (cm) 0.127 % Reduction in Area: -169.5% % Reduction in Volume: -429.2% Epithelialization: None Tunneling: No Undermining: No Wound Description 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 Severity (Wagner): Grade 1 Slough/Fibrin Wound Margin: Flat and Intact Exudate Amount: Medium Exudate Type: Serous Exudate Color: amber [name], [name] ([id number]) er Cleansing: No o Yes Wound Bed Granulation Amount: Small (1-33%) Exposed Structure Granulation Quality: Pink, Pale Fascia Exposed: No Necrotic Amount: Large (67-100%) Fat Layer (Subcutaneous Tissue) Exposed: Yes Necrotic Quality: Adherent Slough Tendon Exposed: No Muscle Exposed: No Joint Exposed: No Bone Exposed: No Periwound Skin Texture Texture Color No Abnormalities Noted: No No Abnormalities Noted: No Induration: Yes Erythema: Yes Erythema Location: Circumferential Moisture Erythema Change: Decreased No Abnormalities Noted: No Dry / Scaly: No Temperature / Pain Maceration: Yes Temperature: No Abnormality Tenderness on Palpation: Yes Wound Preparation Ulcer Cleansing: Rinsed/Irrigated with Saline Topical Anesthetic Applied: Other: lidocaine 4%, Electronic Signature(s) Signed: 08/11/2016 4:45:30 PM By: Regan Lemming BSN, RN Entered By: Regan Lemming on 08/11/2016 11:24:47 [name] ([id number]) -------------------------------------------------------------------------------- Wound Assessment Details Patient Name: [name] Date of Service: 08/11/2016 11:15 AM Medical Record Number: [id number] Patient Account Number: [id number] Date of Birth/Sex:  [birth year] (76 y.o. Male) Treating RN: Baruch Gouty, RN, BSN, Velva Harman Primary Care Vandella Ord: Enid Derry Other Clinician: Referring Neela Zecca: Enid Derry Treating Tenaya Hilyer/Extender: Frann Rider in Treatment: 22 Wound Status Wound Number: 3 Primary Diabetic Wound/Ulcer of the Lower Etiology: Extremity Wound Location: Right Foot - Plantar Wound Open Wounding Event: Pressure Injury Status: Date Acquired: 06/15/2016 Comorbid Cataracts, Chronic sinus Weeks Of Treatment: 8 History: problems/congestion, Anemia, Sleep Clustered Wound: No Apnea, Hypertension, Myocardial Infarction, Type II Diabetes, Received Chemotherapy, Received Radiation Photos Photo Uploaded By: Regan Lemming on 08/11/2016 15:25:15 Wound Measurements  Length: (cm) 0.7 % Reduction in Width: (cm) 0.9 % Reduction in Depth: (cm) 0.1 Epithelializat Area: (cm) 0.495 Tunneling: Volume: (cm) 0.049 Undermining: Area: 72% Volume: 98.2% ion: None No No Wound Description Classification: Grade 1 Foul Odor Aft Wound Margin: Distinct, outline attached Slough/Fibrin [name], [name] ([id number]) er Cleansing: No o Yes Exudate Amount: Large Exudate Type: Serous Exudate Color: amber Wound Bed Granulation Amount: Small (1-33%) Exposed Structure Granulation Quality: Pink, Pale Fascia Exposed: No Necrotic Amount: Medium (34-66%) Fat Layer (Subcutaneous Tissue) Exposed: Yes Necrotic Quality: Adherent Slough Tendon Exposed: No Muscle Exposed: No Joint Exposed: No Bone Exposed: No Periwound Skin Texture Texture Color No Abnormalities Noted: No No Abnormalities Noted: No Callus: Yes Atrophie Blanche: No Crepitus: No Cyanosis: No Excoriation: No Ecchymosis: No Induration: No Erythema: No Rash: No Hemosiderin Staining: No Scarring: No Mottled: No Pallor: No Moisture Rubor: No No Abnormalities Noted: No Dry / Scaly: No Temperature / Pain Maceration: Yes Temperature: No Abnormality Tenderness on  Palpation: Yes Wound Preparation Ulcer Cleansing: Rinsed/Irrigated with Saline Topical Anesthetic Applied: Other: lidocaine 4%, Electronic Signature(s) Signed: 08/11/2016 4:45:30 PM By: Regan Lemming BSN, RN Entered By: Regan Lemming on 08/11/2016 11:25:02 [name] ([id number]) -------------------------------------------------------------------------------- [place] Details Patient Name: [name] Date of Service: 08/11/2016 11:15 AM Medical Record Number: [id number] Patient Account Number: [id number] Date of Birth/Sex: 1940/05/06 (76 y.o. Male) Treating RN: Baruch Gouty, RN, BSN, [place] Primary Care Alberto Pina: Enid Derry Other Clinician: Referring Thy Gullikson: Enid Derry Treating Nycole Kawahara/Extender: Frann Rider in Treatment: 22 Vital Signs Time Taken: 11:17 Temperature (F): 97.5 Height (in): 67 Pulse (bpm): 79 Weight (lbs): 136 Respiratory Rate (breaths/min): 16 Body Mass Index (BMI): 21.3 Blood Pressure (mmHg): 140/57 Reference Range: 80 - 120 mg / dl Electronic Signature(s) Signed: 08/11/2016 4:45:30 PM By: Regan Lemming BSN, RN Entered By: Regan Lemming on 08/11/2016 11:19:50

## 2016-08-15 NOTE — Progress Notes (Signed)
KAIDON, KINKER ([id number]) Visit Report for 08/14/2016 Chief Complaint Document Details Patient Name: [name], [name]. Date of Service: 08/14/2016 12:30 PM Medical Record Number: [id number] Patient Account Number: [id number] Date of Birth/Sex: Mar 14, 1940 (76 y.o. Male) Treating RN: Primary Care Provider: Enid Derry Other Clinician: Referring Provider: Enid Derry Treating Provider/Extender: Frann Rider in Treatment: 22 Information Obtained from: Patient Chief Complaint Patient is at the clinic for treatment of an open pressure ulcer to the right lateral ankle Electronic Signature(s) Signed: 08/14/2016 12:43:48 PM By: Christin Fudge MD, FACS Entered By: Christin Fudge on 08/14/2016 12:43:48 [name] ([id number]) -------------------------------------------------------------------------------- HPI Details Patient Name: [name] Date of Service: 08/14/2016 12:30 PM Medical Record Number: [id number] Patient Account Number: [id number] Date of Birth/Sex: Mar 10, 1940 (76 y.o. Male) Treating RN: Primary Care Provider: Enid Derry Other Clinician: Referring Provider: Enid Derry Treating Provider/Extender: Frann Rider in Treatment: 22 History of Present Illness Location: Patient presents with an ulcer on the right lateral ankle. Quality: Patient reports experiencing a dull pain to affected area(s). Severity: Patient states wound are getting worse. Duration: Patient has had the wound for > 2 months prior to seeking treatment at the wound center Timing: Pain in wound is Intermittent (comes and goes Context: The wound appeared gradually over time Modifying Factors: Other treatment(s) tried include:local care as per the medical oncologist Associated Signs and Symptoms: Patient reports having:some pain and no discharge HPI Description: 76 year old patient has been referred by his medical oncologist Dr. Grayland Ormond, for a right lateral malleolus ulcer has been there  for several weeks. 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 He is currently on chemotherapy with FOLFOX and Herceptin. past medical history significant for diabetes mellitus, anemia, hypertension, sleep apnea, status post appendectomy, peripheral vascular catheterization( Port placement) in September 2017 by Dr. Delana Meyer. He is also receiving palliative radiation therapy and was seen by Dr. Donella Stade. 03/17/2016 -- - x-ray of the right ankle -- IMPRESSION: 1. Soft tissue wound noted over the lateral malleolus, no underlying bony abnormality. No acute bony abnormality. 2. Diffuse degenerative change. 3. Peripheral vascular disease. 05/05/2016 -- the patient's wife tells me that she has noticed a small superficial bruise on the left lateral ankle and wanted me to view this area. 05/12/2016 -- the patient is unable to afford Santyl ointment and we have been struggling over the last 8 weeks to use various alternatives. I have asked them today if they would be willing to use the snap vacuum system and she will let me know soon. 05/19/2016 -- the patient and the wife have declined the use of the snap the vacuum system. He does not want any debridement to be done today. His proteins have improved a bit. 06/08/2016 -- . Not seen the patient back for 3 weeks and in the meanwhile the wife has decided to use duoderm on the wound,-- given to her by a friend. The wound is macerated and has increased in size due to the moisture. 06/16/2016 -- besides the original wound he had on his right lateral malleolus he has a small area on the dorsum of his right foot which is possibly cause due to pressure. He now has a plantar wound on the first metatarsal head on the right foot where his podiatrist Dr. Vickki Muff had debrided a callus yesterday. 08/04/16 on evaluation today patient wounds of the right lower extremity appear to be doing better. He is tolerating the  dressing's although there  is some maceration on the lateral foot wound. Overall there's no evidence of infection. 08/11/2016 -- he has had his first application of Grafix to the right lateral ankle. 08/14/2016 -- the patient was brought in by his wife today as he was complaining of pain over his right ankle DEFOREST, MAIDEN ([id number]) where the graphics was applied a few days ago. Note is made of the fact that he had had a fall with a slight blunt injury to the ankle. Electronic Signature(s) Signed: 08/14/2016 12:45:05 PM By: Christin Fudge MD, FACS Previous Signature: 08/14/2016 12:43:53 PM Version By: Christin Fudge MD, FACS Entered By: Christin Fudge on 08/14/2016 12:45:05 [name] ([id number]) -------------------------------------------------------------------------------- Physical Exam Details Patient Name: MARCIN, Jensen Date of Service: 08/14/2016 12:30 PM Medical Record Number: [id number] Patient Account Number: [id number] Date of Birth/Sex: 1940-04-16 (76 y.o. Male) Treating RN: Primary Care Provider: Enid Derry Other Clinician: Referring Provider: Enid Derry Treating Provider/Extender: Frann Rider in Treatment: 22 Constitutional . Pulse regular. Respirations normal and unlabored. Afebrile. . Eyes Nonicteric. Reactive to light. Ears, Nose, Mouth, and Throat Lips, teeth, and gums WNL.Marland Kitchen Moist mucosa without lesions. Neck supple and nontender. No palpable supraclavicular or cervical adenopathy. Normal sized without goiter. Respiratory WNL. No retractions.. Cardiovascular Pedal Pulses WNL. No clubbing, cyanosis or edema. Chest Breasts symmetical and no nipple discharge.. Breast tissue WNL, no masses, lumps, or tenderness.. Lymphatic No adneopathy. No adenopathy. No adenopathy. Musculoskeletal Adexa without tenderness or enlargement.. Digits and nails w/o clubbing, cyanosis, infection, petechiae, ischemia, or inflammatory conditions.. Integumentary (Hair,  Skin) No suspicious lesions. No crepitus or fluctuance. No peri-wound warmth or erythema. No masses.Marland Kitchen Psychiatric Judgement and insight Intact.. No evidence of depression, anxiety, or agitation.. Notes the wound on the right lateral ankle has the graphics in place with a Mepitel and there is no surrounding cellulitis or any discharge from the wound. Electronic Signature(s) Signed: 08/14/2016 12:44:27 PM By: Christin Fudge MD, FACS Entered By: Christin Fudge on 08/14/2016 12:44:27 [name] ([id number]) -------------------------------------------------------------------------------- Physician Orders Details Patient Name: RAZI, HICKLE. Date of Service: 08/14/2016 12:30 PM Medical Record Number: [id number] Patient Account Number: [id number] Date of Birth/Sex: 18-Oct-1940 (76 y.o. Male) Treating RN: Ahmed Prima Primary Care Provider: Enid Derry Other Clinician: Referring Provider: Enid Derry Treating Provider/Extender: Frann Rider in Treatment: 55 Verbal / Phone Orders: No Diagnosis Coding Wound Cleansing Wound #1 Right,Lateral Malleolus o Clean wound with Normal Saline. o Cleanse wound with mild soap and water Wound #3 Right,Plantar Foot o Clean wound with Normal Saline. o Cleanse wound with mild soap and water Anesthetic Wound #1 Right,Lateral Malleolus o Topical Lidocaine 4% cream applied to wound bed prior to debridement Wound #3 Right,Plantar Foot o Topical Lidocaine 4% cream applied to wound bed prior to debridement Primary Wound Dressing Wound #1 Right,Lateral Malleolus o Other: - Grafix 31mm prime Wound #3 Right,Plantar Foot o Aquacel Ag Secondary Dressing Wound #1 Right,Lateral Malleolus o Gauze and Kerlix/Conform Wound #3 Right,Plantar Foot o Boardered Foam Dressing Dressing Change Frequency Wound #1 Right,Lateral Malleolus o Change dressing every week - Only to be changed in the clinic Wound #3 Right,Plantar Foot o  Change dressing every other day. KYREL, LEIGHTON ([id number]) Follow-up Appointments Wound #1 Right,Lateral Malleolus o Return Appointment in 1 week. Wound #3 Right,Plantar Foot o Return Appointment in 1 week. Edema Control Wound #1 Right,Lateral Malleolus o Elevate legs to the level of the heart and pump ankles as often as possible Off-Loading Wound #1 Right,Lateral Malleolus o Other: - Keep  pressure off the malleolus Wound #3 Right,Plantar Foot o Other: - Keep pressure off the plantar foot Additional Orders / Instructions Wound #1 Right,Lateral Malleolus o Increase protein intake. o Activity as tolerated Advanced Therapies Wound #1 Right,Lateral Malleolus o Grafix Prime application in clinic; including contact layer, fixation with steri strips, dry gauze and cover dressing. - 68mm applied by MD Wound #3 Right,Plantar Foot o Grafix Prime application in clinic; including contact layer, fixation with steri strips, dry gauze and cover dressing. - 63mm applied by MD Medications-please add to medication list. Wound #1 Right,Lateral Malleolus o Other: - Include these in your diet...VITAMIN A, C, ZINC, MVI Electronic Signature(s) Signed: 08/14/2016 4:25:24 PM By: Christin Fudge MD, FACS Signed: 08/14/2016 4:51:42 PM By: Alric Quan Entered By: Alric Quan on 08/14/2016 12:41:49 [name] ([id number]) -------------------------------------------------------------------------------- Problem List Details Patient Name: KEALAN, BUCHAN. Date of Service: 08/14/2016 12:30 PM Medical Record Number: [id number] Patient Account Number: [id number] Date of Birth/Sex: 15-Sep-1940 (76 y.o. 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 05/05/2016 Yes Inactive Problems Resolved Problems ICD-10 Code Description Active Date Resolved Date L89.521 Pressure ulcer of left ankle, stage 1 05/05/2016 05/05/2016 Electronic Signature(s) Signed: 08/14/2016 12:43:37 PM By: Christin Fudge MD, FACS Entered By: Christin Fudge on 08/14/2016 12:43:36 [name] ([id number]) -------------------------------------------------------------------------------- Progress Note Details Patient Name: [name] Date of Service: 08/14/2016 12:30 PM Medical Record Number: [id number] Patient Account Number: [id number] Date of Birth/Sex: Nov 18, 1940 (76 y.o. Male) Treating RN: Primary Care Provider: Enid Derry Other Clinician: Referring Provider: Enid Derry Treating Provider/Extender: Frann Rider in Treatment: 22 Subjective Chief Complaint Information obtained from Patient Patient is at the clinic for treatment of an open pressure ulcer to the right lateral ankle History of Present Illness (HPI) The following HPI elements were documented for the patient's wound: Location: Patient presents with an ulcer on the right lateral ankle. Quality: Patient reports experiencing a dull pain to affected area(s). Severity: Patient states wound are getting worse. Duration: Patient has had the wound for > 2 months prior to seeking treatment at the wound center Timing: Pain in wound is Intermittent (comes and goes Context: The wound appeared gradually over time Modifying Factors: Other treatment(s) tried include:local care as per the medical oncologist Associated Signs and Symptoms: Patient reports having:some pain and no discharge 76 year old patient has been referred by his medical oncologist Dr. Grayland Ormond, for a right  lateral malleolus ulcer has been there for several weeks. 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 He is currently on chemotherapy with FOLFOX and Herceptin. past medical history significant for diabetes mellitus, anemia, hypertension, sleep apnea, status post appendectomy, peripheral vascular catheterization( Port placement) in September 2017 by Dr. Delana Meyer. He is also receiving palliative radiation therapy and was seen by Dr. Donella Stade. 03/17/2016 -- - x-ray of the right ankle -- IMPRESSION: 1. Soft tissue wound noted over the lateral malleolus, no underlying bony abnormality. No acute bony abnormality. 2. Diffuse degenerative change. 3. Peripheral vascular disease. 05/05/2016 -- the patient's wife tells me that she has noticed a small superficial bruise on the  left lateral ankle and wanted me to view this area. 05/12/2016 -- the patient is unable to afford Santyl ointment and we have been struggling over the last 8 weeks to use various alternatives. I have asked them today if they would be willing to use the snap vacuum system and she will let me know soon. 05/19/2016 -- the patient and the wife have declined the use of the snap the vacuum system. He does not want any debridement to be done today. His proteins have improved a bit. 06/08/2016 -- . Not seen the patient back for 3 weeks and in the meanwhile the wife has decided to use duoderm on the wound,-- given to her by a friend. The wound is macerated and has increased in size due to the moisture. 06/16/2016 -- besides the original wound he had on his right lateral malleolus he has a small area on the TEGH, FRANEK. ([id number]) dorsum of his right foot which is possibly cause due to pressure. He now has a plantar wound on the first metatarsal head on the right foot where his podiatrist Dr. Vickki Muff had debrided a callus yesterday. 08/04/16 on evaluation today patient wounds of the  right lower extremity appear to be doing better. He is tolerating the dressing's although there is some maceration on the lateral foot wound. Overall there's no evidence of infection. 08/11/2016 -- he has had his first application of Grafix to the right lateral ankle. 08/14/2016 -- the patient was brought in by his wife today as he was complaining of pain over his right ankle where the graphics was applied a few days ago. Note is made of the fact that he had had a fall with a slight blunt injury to the ankle. Objective Constitutional Pulse regular. Respirations normal and unlabored. Afebrile. Vitals Time Taken: 12:31 PM, Height: 67 in, Weight: 136 lbs, BMI: 21.3, Temperature: 97.7 F, Pulse: 79 bpm, Respiratory Rate: 16 breaths/min, Blood Pressure: 113/49 mmHg. Eyes Nonicteric. Reactive to light. Ears, Nose, Mouth, and Throat Lips, teeth, and gums WNL.Marland Kitchen Moist mucosa without lesions. Neck supple and nontender. No palpable supraclavicular or cervical adenopathy. Normal sized without goiter. Respiratory WNL. No retractions.. Cardiovascular Pedal Pulses WNL. No clubbing, cyanosis or edema. Chest Breasts symmetical and no nipple discharge.. Breast tissue WNL, no masses, lumps, or tenderness.. Lymphatic No adneopathy. No adenopathy. No adenopathy. Musculoskeletal NAZAIR, FORTENBERRY ([id number]) Adexa without tenderness or enlargement.. Digits and nails w/o clubbing, cyanosis, infection, petechiae, ischemia, or inflammatory conditions.Marland Kitchen Psychiatric Judgement and insight Intact.. No evidence of depression, anxiety, or agitation.. General Notes: the wound on the right lateral ankle has the graphics in place with a Mepitel and there is no surrounding cellulitis or any discharge from the wound. Integumentary (Hair, Skin) No suspicious lesions. No crepitus or fluctuance. No peri-wound warmth or erythema. No masses.. Wound #1 status is Open. Original cause of wound was Gradually Appeared. The  wound is located on the Right,Lateral Malleolus. The wound measures 0.9cm length x 1cm width x 0.1cm depth; 0.707cm^2 area and 0.071cm^3 volume. There is Fat Layer (Subcutaneous Tissue) Exposed exposed. There is no tunneling or undermining noted. There is a large amount of serous drainage noted. The wound margin is flat and intact. There is large (67-100%) pink, pale granulation within the wound bed. There is no necrotic tissue within the wound bed. The periwound skin appearance exhibited: Induration, Maceration, Erythema. The periwound skin appearance did not exhibit: Dry/Scaly. The surrounding wound skin color is noted with erythema which is circumferential. Periwound temperature  was noted as No Abnormality. The periwound has tenderness on palpation. General Notes: pt has a skin graft on there Assessment Active Problems ICD-10 E11.621 - Type 2 diabetes mellitus with foot ulcer L89.512 - Pressure ulcer of right ankle, stage 2 L97.512 - Non-pressure chronic ulcer of other part of right foot with fat layer exposed E44.1 - Mild protein-calorie malnutrition Plan Wound Cleansing: Wound #1 Right,Lateral Malleolus: Clean wound with Normal Saline. Cleanse wound with mild soap and water Wound #3 Right,Plantar Foot: Clean wound with Normal Saline. RODGER, GIANGREGORIO ([id number]) Cleanse wound with mild soap and water Anesthetic: Wound #1 Right,Lateral Malleolus: Topical Lidocaine 4% cream applied to wound bed prior to debridement Wound #3 Right,Plantar Foot: Topical Lidocaine 4% cream applied to wound bed prior to debridement Primary Wound Dressing: Wound #1 Right,Lateral Malleolus: Other: - Grafix 46mm prime Wound #3 Right,Plantar Foot: Aquacel Ag Secondary Dressing: Wound #1 Right,Lateral Malleolus: Gauze and Kerlix/Conform Wound #3 Right,Plantar Foot: Boardered Foam Dressing Dressing Change Frequency: Wound #1 Right,Lateral Malleolus: Change dressing every week - Only to be changed  in the clinic Wound #3 Right,Plantar Foot: Change dressing every other day. Follow-up Appointments: Wound #1 Right,Lateral Malleolus: Return Appointment in 1 week. Wound #3 Right,Plantar Foot: Return Appointment in 1 week. Edema Control: Wound #1 Right,Lateral Malleolus: Elevate legs to the level of the heart and pump ankles as often as possible Off-Loading: Wound #1 Right,Lateral Malleolus: Other: - Keep pressure off the malleolus Wound #3 Right,Plantar Foot: Other: - Keep pressure off the plantar foot Additional Orders / Instructions: Wound #1 Right,Lateral Malleolus: Increase protein intake. Activity as tolerated Advanced Therapies: Wound #1 Right,Lateral Malleolus: Grafix Prime application in clinic; including contact layer, fixation with steri strips, dry gauze and cover dressing. - 1mm applied by MD Wound #3 Right,Plantar Foot: Grafix Prime application in clinic; including contact layer, fixation with steri strips, dry gauze and cover dressing. - 37mm applied by MD Medications-please add to medication list.: Wound #1 Right,Lateral Malleolus: Other: - Include these in your diet...VITAMIN A, C, ZINC, MVI TEGAN, BURNSIDE. ([id number]) the wound on the right ankle is looking good with no evidence of cellulitis or purulent drainage. I have recommended we keep the graphics in place, given was not compression over the ankle which will help him if he has had a mild sprain. He would also benefit from ibuprofen, 400 mg 3 times a day after meals He will keep his scheduled appointment next week for application of Grafix Electronic Signature(s) Signed: 08/14/2016 12:45:59 PM By: Christin Fudge MD, FACS Entered By: Christin Fudge on 08/14/2016 12:45:59 [name] ([id number]) -------------------------------------------------------------------------------- [facility] Details Patient Name: [name] Date of Service: 08/14/2016 Medical Record Number: [id number] Patient Account  Number: [id number] Date of Birth/Sex: [birth year] (76 y.o. Male) Treating RN: Primary Care Provider: Enid Derry Other Clinician: Referring Provider: Enid Derry Treating Provider/Extender: Frann Rider in Treatment: 22 Diagnosis Coding ICD-10 Codes Code Description E11.621 Type 2 diabetes mellitus with foot ulcer L89.512 Pressure ulcer of right ankle, stage 2 L97.512 Non-pressure chronic ulcer of other part of right foot with fat layer exposed 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 Facility Procedures CPT4 Code: [id number] Description: [phone] - WOUND CARE VISIT-LEV 2 EST PT Modifier: Quantity: 1 Physician Procedures CPT4 Code Description: [id number] - WC PHYS LEVEL 3 - EST PT ICD-10 Description Diagnosis E11.621 Type 2 diabetes mellitus with foot ulcer L89.512 Pressure ulcer of right ankle, stage 2 L97.512 Non-pressure chronic ulcer of other part of right fo  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 Modifier:  ot with fat lay Quantity: 1 er exposed Engineer, maintenance) Signed: 08/14/2016 5:28:01 PM By: Gretta Cool, BSN, RN, CWS, Kim RN, BSN Previous Signature: 08/14/2016 12:46:12 PM Version By: Christin Fudge MD, FACS Entered By: Gretta Cool, BSN, RN, CWS, Kim on 08/14/2016 17:28:00

## 2016-08-15 NOTE — Progress Notes (Signed)
[name] ([id number]) Visit Report for 08/14/2016 Arrival Information Details Patient Name: [name] Date of Service: 08/14/2016 12:30 PM Medical Record Number: [id number] Patient Account Number: [id number] Date of Birth/Sex: 1940/12/11 (75 y.o. Male) Treating RN: Ahmed Prima Primary Care Kionna Brier: Enid Derry Other Clinician: Referring Surya Folden: Enid Derry Treating Abeer Iversen/Extender: Frann Rider in Treatment: 22 Visit Information History Since Last Visit All ordered tests and consults were completed: No Patient Arrived: [name] Added or deleted any medications: No Arrival Time: 12:30 Any new allergies or adverse reactions: No Accompanied By: wife Had a fall or experienced change in No Transfer Assistance: EasyPivot Patient activities of daily living that may affect Lift risk of falls: Patient Identification Verified: Yes Signs or symptoms of abuse/neglect since last No Secondary Verification Process Yes visito Completed: Hospitalized since last visit: No Patient Requires Transmission- No Has Dressing in Place as Prescribed: Yes Based Precautions: Pain Present Now: Yes Patient Has Alerts: Yes Patient Alerts: Patient on Blood Thinner DM II 81mg  aspirin twice daily Electronic Signature(s) Signed: 08/14/2016 4:51:42 PM By: Alric Quan Entered By: Alric Quan on 08/14/2016 12:30:43 [name] ([id number]) -------------------------------------------------------------------------------- Clinic Level of Care Assessment Details Patient Name: [name] Date of Service: 08/14/2016 12:30 PM Medical Record Number: [id number] Patient Account Number: [id number] Date of Birth/Sex: 03/23/1940 (76 y.o. Male) Treating RN: Ahmed Prima Primary Care Travarus Trudo: Enid Derry Other Clinician: Referring Deigo Alonso: Enid Derry Treating Chesney Suares/Extender: Frann Rider in Treatment: 22 Clinic Level of Care Assessment Items TOOL 4  Quantity Score []  - Use when only an EandM is performed on FOLLOW-UP visit 0 ASSESSMENTS - Nursing Assessment / Reassessment []  - Reassessment of Co-morbidities (includes updates in patient status) 0 X - Reassessment of Adherence to Treatment Plan 1 5 ASSESSMENTS - Wound and Skin Assessment / Reassessment X - Simple Wound Assessment / Reassessment - one wound 1 5 []  - Complex Wound Assessment / Reassessment - multiple wounds 0 []  - Dermatologic / Skin Assessment (not related to wound area) 0 ASSESSMENTS - Focused Assessment []  - Circumferential Edema Measurements - multi extremities 0 []  - Nutritional Assessment / Counseling / Intervention 0 []  - Lower Extremity Assessment (monofilament, tuning fork, pulses) 0 []  - Peripheral Arterial Disease Assessment (using hand held doppler) 0 ASSESSMENTS - Ostomy and/or Continence Assessment and Care []  - Incontinence Assessment and Management 0 []  - Ostomy Care Assessment and Management (repouching, etc.) 0 PROCESS - Coordination of Care X - Simple Patient / Family Education for ongoing care 1 15 []  - Complex (extensive) Patient / Family Education for ongoing care 0 []  - Staff obtains Programmer, systems, Records, Test Results / Process Orders 0 []  - Staff telephones HHA, Nursing Homes / Clarify orders / etc 0 []  - Routine Transfer to another Facility (non-emergent condition) 0 [name] ([id number]) []  - Routine Hospital Admission (non-emergent condition) 0 []  - New Admissions / Biomedical engineer / Ordering NPWT, Apligraf, etc. 0 []  - Emergency Hospital Admission (emergent condition) 0 X - Simple Discharge Coordination 1 10 []  - Complex (extensive) Discharge Coordination 0 PROCESS - Special Needs []  - Pediatric / Minor Patient Management 0 []  - Isolation Patient Management 0 []  - Hearing / Language / Visual special needs 0 []  - Assessment of Community assistance (transportation, D/C planning, etc.) 0 []  - Additional assistance / Altered  mentation 0 []  - Support Surface(s) Assessment (bed, cushion, seat, etc.) 0 INTERVENTIONS - Wound Cleansing / Measurement X - Simple Wound Cleansing - one wound 1 5 []  -  Complex Wound Cleansing - multiple wounds 0 X - Wound Imaging (photographs - any number of wounds) 1 5 []  - Wound Tracing (instead of photographs) 0 X - Simple Wound Measurement - one wound 1 5 []  - Complex Wound Measurement - multiple wounds 0 INTERVENTIONS - Wound Dressings X - Small Wound Dressing one or multiple wounds 1 10 []  - Medium Wound Dressing one or multiple wounds 0 []  - Large Wound Dressing one or multiple wounds 0 []  - Application of Medications - topical 0 []  - Application of Medications - injection 0 INTERVENTIONS - Miscellaneous []  - External ear exam 0 [name] ([id number]) []  - Specimen Collection (cultures, biopsies, blood, body fluids, etc.) 0 []  - Specimen(s) / Culture(s) sent or taken to Lab for analysis 0 []  - Patient Transfer (multiple staff / Harrel Lemon Lift / Similar devices) 0 []  - Simple Staple / Suture removal (25 or less) 0 []  - Complex Staple / Suture removal (26 or more) 0 []  - Hypo / Hyperglycemic Management (close monitor of Blood Glucose) 0 []  - Ankle / Brachial Index (ABI) - do not check if billed separately 0 X - Vital Signs 1 5 Has the patient been seen at the hospital within the last three years: Yes Total Score: 65 Level Of Care: New/Established - Level 2 Electronic Signature(s) Signed: 08/14/2016 4:51:42 PM By: Alric Quan Entered By: Alric Quan on 08/14/2016 12:48:34 [name] ([id number]) -------------------------------------------------------------------------------- Encounter Discharge Information Details Patient Name: [name] Date of Service: 08/14/2016 12:30 PM Medical Record Number: [id number] Patient Account Number: [id number] Date of Birth/Sex: Nov 18, 1940 (76 y.o. Male) Treating RN: Primary Care Deondray Ospina: Enid Derry Other  Clinician: Referring Choua Chalker: Enid Derry Treating Candy Ziegler/Extender: Frann Rider in Treatment: 22 Encounter Discharge Information Items Discharge Pain Level: 0 Discharge Condition: Stable Ambulatory Status: Walker Discharge Destination: Home Transportation: Other Accompanied By: wife Schedule Follow-up Appointment: Yes Medication Reconciliation completed Yes and provided to Patient/Care Yenny Kosa: Provided on Clinical Summary of Care: 08/14/2016 Form Type Recipient Paper Patient FW Electronic Signature(s) Signed: 08/14/2016 4:51:42 PM By: Alric Quan Previous Signature: 08/14/2016 12:48:36 PM Version By: Ruthine Dose Entered By: Alric Quan on 08/14/2016 12:49:44 [name] ([id number]) -------------------------------------------------------------------------------- Lower Extremity Assessment Details Patient Name: [name] Date of Service: 08/14/2016 12:30 PM Medical Record Number: [id number] Patient Account Number: [id number] Date of Birth/Sex: [birth year] (76 y.o. Male) Treating RN: Ahmed Prima Primary Care Haiden Rawlinson: Enid Derry Other Clinician: Referring Corbyn Wildey: Enid Derry Treating Commodore Bellew/Extender: Frann Rider in Treatment: 22 Vascular Assessment Pulses: Dorsalis Pedis Palpable: [Right:Yes] Posterior Tibial Extremity colors, hair growth, and conditions: Extremity Color: [Right:Normal] Temperature of Extremity: [Right:Warm] Capillary Refill: [Right:< 3 seconds] Electronic Signature(s) Signed: 08/14/2016 4:51:42 PM By: Alric Quan Entered By: Alric Quan on 08/14/2016 12:36:56 [name] ([id number]) -------------------------------------------------------------------------------- Multi Wound Chart Details Patient Name: [name] Date of Service: 08/14/2016 12:30 PM Medical Record Number: [id number] Patient Account Number: [id number] Date of Birth/Sex: 1941/01/05 (75 y.o. Male) Treating RN: Ahmed Prima Primary Care Peytyn Trine: Enid Derry Other Clinician: Referring Kevin Mario: Enid Derry Treating Sorina Derrig/Extender: Frann Rider in Treatment: 22 Vital Signs Height(in): 67 Pulse(bpm): 79 Weight(lbs): 136 Blood Pressure 113/49 (mmHg): Body Mass Index(BMI): 21 Temperature(F): 97.7 Respiratory Rate 16 (breaths/min): Photos: [1:No Photos] [N/A:N/A] Wound Location: [1:Right Malleolus - Lateral] [N/A:N/A] Wounding Event: [1:Gradually Appeared] [N/A:N/A] Primary Etiology: [1:Pressure Ulcer] [N/A:N/A] Secondary Etiology: [1:Diabetic Wound/Ulcer of the Lower Extremity] [N/A:N/A] Comorbid History: [1:Cataracts, Chronic sinus problems/congestion, Anemia, Sleep Apnea, Hypertension, Myocardial Infarction, Type II Diabetes, Received  Chemotherapy, Received Radiation] [N/A:N/A] Date Acquired: [1:01/09/2016] [N/A:N/A] Weeks of Treatment: [1:22] [N/A:N/A] Wound Status: [1:Open] [N/A:N/A] Measurements L x W x D 0.9x1x0.1 [N/A:N/A] (cm) Area (cm) : [1:0.707] [N/A:N/A] Volume (cm) : [1:0.071] [N/A:N/A] % Reduction in Area: [1:-199.60%] [N/A:N/A] % Reduction in Volume: -195.80% [N/A:N/A] Classification: [1:Category/Stage III] [N/A:N/A] HBO Classification: [1:Grade 1] [N/A:N/A] Exudate Amount: [1:Large] [N/A:N/A] Exudate Type: [1:Serous] [N/A:N/A] Exudate Color: [1:amber] [N/A:N/A] Wound Margin: [1:Flat and Intact] [N/A:N/A] Granulation Amount: [1:Large (67-100%)] [N/A:N/A] Granulation Quality: Pink, Pale N/A N/A Necrotic Amount: None Present (0%) N/A N/A Exposed Structures: Fat Layer (Subcutaneous N/A N/A Tissue) Exposed: Yes Fascia: No Tendon: No Muscle: No Joint: No Bone: No Epithelialization: None N/A N/A Periwound Skin Texture: Induration: Yes N/A N/A Periwound Skin Maceration: Yes N/A N/A Moisture: Dry/Scaly: No Periwound Skin Color: Erythema: Yes N/A N/A Erythema Location: Circumferential N/A N/A Erythema Change: Decreased N/A N/A Temperature: No Abnormality  N/A N/A Tenderness on Yes N/A N/A Palpation: Wound Preparation: Ulcer Cleansing: N/A N/A Rinsed/Irrigated with Saline Topical Anesthetic Applied: Other: lidocaine 4% Assessment Notes: pt has a skin graft on N/A N/A there Treatment Notes Electronic Signature(s) Signed: 08/14/2016 12:43:41 PM By: Christin Fudge MD, FACS Entered By: Christin Fudge on 08/14/2016 12:43:41 [name] ([id number]) -------------------------------------------------------------------------------- [place] Details Patient Name: TAYO, MAUTE. Date of Service: 08/14/2016 12:30 PM Medical Record Number: [id number] Patient Account Number: [id number] Date of Birth/Sex: 09-02-1940 (76 y.o. Male) Treating RN: Ahmed Prima Primary Care Gunner Iodice: Enid Derry Other Clinician: Referring Aldora Perman: Enid Derry Treating Tanga Gloor/Extender: Frann Rider in Treatment: 22 Active Inactive ` Orientation to the Wound Care Program Nursing Diagnoses: Knowledge deficit related to the wound healing center program Goals: Patient/caregiver will verbalize understanding of the [place] Program Date Initiated: 03/10/2016 Target Resolution Date: 03/23/2016 Goal Status: Active Interventions: Provide education on orientation to the wound center Notes: ` Soft Tissue Infection Nursing Diagnoses: Impaired tissue integrity Potential for infection: soft tissue Goals: Patient will remain free of wound infection Date Initiated: 03/10/2016 Target Resolution Date: 03/17/2016 Goal Status: Active Interventions: Assess signs and symptoms of infection every visit Notes: ` Wound/Skin Impairment Nursing Diagnoses: Impaired tissue integrity AUGUSTIN, BUN ([id number]) Goals: Ulcer/skin breakdown will heal within 14 weeks Date Initiated: 03/10/2016 Target Resolution Date: 03/24/2016 Goal Status: Active Interventions: Assess patient/caregiver ability to obtain necessary  supplies Notes: Electronic Signature(s) Signed: 08/14/2016 4:51:42 PM By: Alric Quan Entered By: Alric Quan on 08/14/2016 12:37:02 [name] ([id number]) -------------------------------------------------------------------------------- Pain Assessment Details Patient Name: [name] Date of Service: 08/14/2016 12:30 PM Medical Record Number: [id number] Patient Account Number: [id number] Date of Birth/Sex: 07-May-1940 (76 y.o. Male) Treating RN: Ahmed Prima Primary Care Kemiya Batdorf: Enid Derry Other Clinician: Referring Daron Stutz: Enid Derry Treating Yashika Mask/Extender: Frann Rider in Treatment: 22 Active Problems Location of Pain Severity and Description of Pain Patient Has Paino Yes Site Locations Pain Location: Pain in Ulcers With Dressing Change: Yes Rate the pain. Current Pain Level: 4 Character of Pain Describe the Pain: Aching, Burning Pain Management and Medication Current Pain Management: Electronic Signature(s) Signed: 08/14/2016 4:51:42 PM By: Alric Quan Entered By: Alric Quan on 08/14/2016 12:30:58 [name] ([id number]) -------------------------------------------------------------------------------- Patient/Caregiver Education Details Patient Name: [name] Date of Service: 08/14/2016 12:30 PM Medical Record Number: [id number] Patient Account Number: [id number] Date of Birth/Gender: 05-04-1940 (76 y.o. Male) Treating RN: Ahmed Prima Primary Care Physician: Enid Derry Other Clinician: Referring Physician: Enid Derry Treating Physician/Extender: Frann Rider in Treatment: 22 Education Assessment Education Provided To: Patient Education Topics  Provided Wound/Skin Impairment: Handouts: Caring for Your Ulcer Methods: Demonstration, Explain/Verbal Responses: State content correctly Electronic Signature(s) Signed: 08/14/2016 4:51:42 PM By: Alric Quan Entered By: Alric Quan on  08/14/2016 12:49:54 [name] ([id number]) -------------------------------------------------------------------------------- Wound Assessment Details Patient Name: [name] Date of Service: 08/14/2016 12:30 PM Medical Record Number: [id number] Patient Account Number: [id number] Date of Birth/Sex: [birth year] (76 y.o. Male) Treating RN: Ahmed Prima Primary Care Audryana Hockenberry: Enid Derry Other Clinician: Referring Kuuipo Anzaldo: Enid Derry Treating Vint Pola/Extender: Frann Rider in Treatment: 22 Wound Status Wound Number: 1 Primary Pressure Ulcer Etiology: Wound Location: Right Malleolus - Lateral Secondary Diabetic Wound/Ulcer of the Lower Wounding Event: Gradually Appeared Etiology: Extremity Date Acquired: 01/09/2016 Wound Open Weeks Of Treatment: 22 Status: Clustered Wound: No Comorbid Cataracts, Chronic sinus History: problems/congestion, Anemia, Sleep Apnea, Hypertension, Myocardial Infarction, Type II Diabetes, Received Chemotherapy, Received Radiation Photos Photo Uploaded By: Gretta Cool, BSN, RN, CWS, Kim on 08/14/2016 14:43:24 Wound Measurements Length: (cm) 0.9 Width: (cm) 1 Depth: (cm) 0.1 Area: (cm) 0.707 Volume: (cm) 0.071 % Reduction in Area: -199.6% % Reduction in Volume: -195.8% Epithelialization: None Tunneling: No Undermining: No Wound Description 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 Slough/Fib Wound Margin: Flat and Intact Exudate Amount: Large Exudate Type: Serous Exudate Color: amber After Cleansing: No rino Yes Wound Bed Granulation Amount: Large (67-100%) Exposed Structure SHARROD, ACHILLE ([id number]) Granulation Quality: Pink, Pale Fascia Exposed: No Necrotic Amount: None Present (0%) Fat Layer (Subcutaneous Tissue) Exposed: Yes Tendon Exposed: No Muscle Exposed: No Joint Exposed: No Bone Exposed: No Periwound Skin Texture Texture Color No Abnormalities Noted: No No  Abnormalities Noted: No Induration: Yes Erythema: Yes Erythema Location: Circumferential Moisture Erythema Change: Decreased No Abnormalities Noted: No Dry / Scaly: No Temperature / Pain Maceration: Yes Temperature: No Abnormality Tenderness on Palpation: Yes Wound Preparation Ulcer Cleansing: Rinsed/Irrigated with Saline Topical Anesthetic Applied: Other: lidocaine 4%, Assessment Notes pt has a skin graft on there Treatment Notes Wound #1 (Right, Lateral Malleolus) 4. Dressing Applied: Other dressing (specify in notes) Notes Steri-strips, gauze, kerix and coban Electronic Signature(s) Signed: 08/14/2016 4:51:42 PM By: Alric Quan Entered By: Alric Quan on 08/14/2016 12:36:32 [name] ([id number]) -------------------------------------------------------------------------------- [place] Details Patient Name: [name] Date of Service: 08/14/2016 12:30 PM Medical Record Number: [id number] Patient Account Number: [id number] Date of Birth/Sex: [birth year] (76 y.o. Male) Treating RN: Ahmed Prima Primary Care Miracle Criado: Enid Derry Other Clinician: Referring Leathie Weich: Enid Derry Treating Shelanda Duvall/Extender: Frann Rider in Treatment: 22 Vital Signs Time Taken: 12:31 Temperature (F): 97.7 Height (in): 67 Pulse (bpm): 79 Weight (lbs): 136 Respiratory Rate (breaths/min): 16 Body Mass Index (BMI): 21.3 Blood Pressure (mmHg): 113/49 Reference Range: 80 - 120 mg / dl Electronic Signature(s) Signed: 08/14/2016 4:51:42 PM By: Alric Quan Entered By: Alric Quan on 08/14/2016 12:34:36

## 2016-08-18 ENCOUNTER — Encounter: Admitting: Physician Assistant

## 2016-08-18 DIAGNOSIS — S91001A Unspecified open wound, right ankle, initial encounter: Secondary | ICD-10-CM | POA: Diagnosis not present

## 2016-08-18 DIAGNOSIS — S91301A Unspecified open wound, right foot, initial encounter: Secondary | ICD-10-CM | POA: Diagnosis not present

## 2016-08-18 DIAGNOSIS — E11621 Type 2 diabetes mellitus with foot ulcer: Secondary | ICD-10-CM | POA: Diagnosis not present

## 2016-08-19 NOTE — Progress Notes (Signed)
[name], [name] ([id number]) Visit Report for 08/18/2016 Allergy List Details Patient Name: [name], [name]. Date of Service: 08/18/2016 12:30 PM Medical Record Number: [id number] Patient Account Number: [id number] Date of Birth/Sex: 04-26-1940 (76 y.o. Male) Treating RN: Baruch Gouty, RN, BSN, Velva Harman Primary Care Ben Habermann: En[place] Derry Other Clinician: Referring Hezakiah Champeau: [place] Derry Treating Darbi Chandran/Extender: Melburn Hake, HOYT Weeks in Treatment: 23 Allergies Active Allergies sufa drugs Cephalosporins lisinopril losartin Keflex amoxicillin ampicillin Septra Bactrim Allergy Notes Electronic Signature(s) Signed: 08/18/2016 1:51:46 PM By: Regan Lemming BSN, RN Entered By: Regan Lemming on 08/18/2016 13:35:30 [name] ([id number]) -------------------------------------------------------------------------------- [place] Details Patient Name: [name] Date of Service: 08/18/2016 12:30 PM Medical Record Number: [id number] Patient Account Number: [id number] Date of Birth/Sex: Aug 23, 1940 (76 y.o. Male) Treating RN: Baruch Gouty, RN, BSN, [place] Primary Care Hanan Mcwilliams: Enid Derry Other Clinician: Referring Briza Bark: [place] Derry Treating Tanji Storrs/Extender: Melburn Hake, HOYT Weeks in Treatment: 23 Visit Information Patient Arrived: Ambulatory Arrival Time: 12:35 Accompanied By: wife Transfer Assistance: None Patient Identification Verified: Yes Secondary Verification Process Yes Completed: Patient Requires Transmission- No Based Precautions: Patient Has Alerts: Yes Patient Alerts: Patient on Blood Thinner DM II 81mg  aspirin twice daily History Since Last Visit All ordered tests and consults were completed: No Added or deleted any medications: No Any new allergies or adverse reactions: No Had a fall or experienced change in activities of daily living that may affect risk of falls: No Signs or symptoms of abuse/neglect since last visito No Hospitalized since last visit:  No Has Dressing in Place as Prescribed: Yes Electronic Signature(s) Signed: 08/18/2016 1:51:46 PM By: Regan Lemming BSN, RN Entered By: Regan Lemming on 08/18/2016 12:36:16 [name] ([id number]) -------------------------------------------------------------------------------- Clinic Level of Care Assessment Details Patient Name: [name] Date of Service: 08/18/2016 12:30 PM Medical Record Number: [id number] Patient Account Number: [id number] Date of Birth/Sex: April 28, 1940 (76 y.o. Male) Treating RN: Baruch Gouty, RN, BSN, [place] Primary Care Loyalty Brashier: Enid Derry Other Clinician: Referring Jonette Wassel: En[place] Derry Treating Ishmeal Rorie/Extender: Melburn Hake, HOYT Weeks in Treatment: 23 Clinic Level of Care Assessment Items TOOL 4 Quantity Score []  - Use when only an EandM is performed on FOLLOW-UP visit 0 ASSESSMENTS - Nursing Assessment / Reassessment X - Reassessment of Co-morbidities (includes updates in patient status) 1 10 X - Reassessment of Adherence to Treatment Plan 1 5 ASSESSMENTS - Wound and Skin Assessment / Reassessment []  - Simple Wound Assessment / Reassessment - one wound 0 X - Complex Wound Assessment / Reassessment - multiple wounds 2 5 []  - Dermatologic / Skin Assessment (not related to wound area) 0 ASSESSMENTS - Focused Assessment []  - Circumferential Edema Measurements - multi extremities 0 []  - Nutritional Assessment / Counseling / Intervention 0 X - Lower Extremity Assessment (monofilament, tuning fork, pulses) 1 5 []  - Peripheral Arterial Disease Assessment (using hand held doppler) 0 ASSESSMENTS - Ostomy and/or Continence Assessment and Care []  - Incontinence Assessment and Management 0 []  - Ostomy Care Assessment and Management (repouching, etc.) 0 PROCESS - Coordination of Care X - Simple Patient / Family Education for ongoing care 1 15 []  - Complex (extensive) Patient / Family Education for ongoing care 0 X - Staff obtains Programmer, systems, Records, Test Results /  Process Orders 1 10 []  - Staff telephones HHA, Nursing Homes / Clarify orders / etc 0 []  - Routine Transfer to another Facility (non-emergent condition) 0 [name], [name] ([id number]) []  - Routine Hospital Admission (non-emergent condition) 0 []  - New Admissions / Insurance Authorizations / Ordering NPWT, Apligraf, etc.  0 []  - Emergency Hospital Admission (emergent condition) 0 []  - Simple Discharge Coordination 0 []  - Complex (extensive) Discharge Coordination 0 PROCESS - Special Needs []  - Pediatric / Minor Patient Management 0 []  - Isolation Patient Management 0 []  - Hearing / Language / Visual special needs 0 []  - Assessment of Community assistance (transportation, D/C planning, etc.) 0 []  - Additional assistance / Altered mentation 0 []  - Support Surface(s) Assessment (bed, cushion, seat, etc.) 0 INTERVENTIONS - Wound Cleansing / Measurement []  - Simple Wound Cleansing - one wound 0 X - Complex Wound Cleansing - multiple wounds 2 5 X - Wound Imaging (photographs - any number of wounds) 1 5 []  - Wound Tracing (instead of photographs) 0 []  - Simple Wound Measurement - one wound 0 X - Complex Wound Measurement - multiple wounds 2 5 INTERVENTIONS - Wound Dressings X - Small Wound Dressing one or multiple wounds 2 10 []  - Medium Wound Dressing one or multiple wounds 0 []  - Large Wound Dressing one or multiple wounds 0 []  - Application of Medications - topical 0 []  - Application of Medications - injection 0 INTERVENTIONS - Miscellaneous []  - External ear exam 0 [name], [name] ([id number]) []  - Specimen Collection (cultures, biopsies, blood, body fluids, etc.) 0 []  - Specimen(s) / Culture(s) sent or taken to Lab for analysis 0 []  - Patient Transfer (multiple staff / Harrel Lemon Lift / Similar devices) 0 []  - Simple Staple / Suture removal (25 or less) 0 []  - Complex Staple / Suture removal (26 or more) 0 []  - Hypo / Hyperglycemic Management (close monitor of Blood Glucose) 0 []  - Ankle  / Brachial Index (ABI) - do not check if billed separately 0 X - Vital Signs 1 5 Has the patient been seen at the hospital within the last three years: Yes Total Score: 105 Level Of Care: New/Established - Level 3 Electronic Signature(s) Signed: 08/18/2016 1:51:46 PM By: Regan Lemming BSN, RN Entered By: Regan Lemming on 08/18/2016 13:23:51 [name] ([id number]) -------------------------------------------------------------------------------- Encounter Discharge Information Details Patient Name: [name] Date of Service: 08/18/2016 12:30 PM Medical Record Number: [id number] Patient Account Number: [id number] Date of Birth/Sex: 1940/08/31 (76 y.o. Male) Treating RN: Baruch Gouty, RN, BSN, Velva Harman Primary Care Shakela Donati: En[place] Derry Other Clinician: Referring Adin Laker: [place] Derry Treating Gagandeep Pettet/Extender: Melburn Hake, HOYT Weeks in Treatment: 3 Encounter Discharge Information Items Discharge Pain Level: 0 Discharge Condition: Stable Ambulatory Status: Walker Discharge Destination: Home Transportation: Private Auto Accompanied By: wife Schedule Follow-up Appointment: No Medication Reconciliation completed No and provided to Patient/Care Gavina Dildine: Provided on Clinical Summary of Care: 08/18/2016 Form Type Recipient Paper Patient FW Electronic Signature(s) Signed: 08/18/2016 1:34:50 PM By: Ruthine Dose Entered By: Ruthine Dose on 08/18/2016 13:34:50 [name] ([id number]) -------------------------------------------------------------------------------- Lower Extremity Assessment Details Patient Name: [name] Date of Service: 08/18/2016 12:30 PM Medical Record Number: [id number] Patient Account Number: [id number] Date of Birth/Sex: 1940-11-07 (76 y.o. Male) Treating RN: Baruch Gouty, RN, BSN, Velva Harman Primary Care Lauralye Kinn: Enid Derry Other Clinician: Referring Raniyah Curenton: [place] Derry Treating Shaleen Talamantez/Extender: Melburn Hake, HOYT Weeks in Treatment: 23 Vascular  Assessment Claudication: Claudication Assessment [Right:None] Pulses: Dorsalis Pedis Palpable: [Right:Yes] Posterior Tibial Extremity colors, hair growth, and conditions: Extremity Color: [Right:Mottled] Hair Growth on Extremity: [Right:Yes] Temperature of Extremity: [Right:Warm] Capillary Refill: [Right:< 3 seconds] Toe Nail Assessment Left: Right: Thick: Yes Discolored: Yes Deformed: No Improper Length and Hygiene: Yes Electronic Signature(s) Signed: 08/18/2016 1:51:46 PM By: Regan Lemming BSN, RN Entered By: Regan Lemming on 08/18/2016 12:50:51  [name], [name] ([id number]) -------------------------------------------------------------------------------- Multi Wound Chart Details Patient Name: [name], [name]. Date of Service: 08/18/2016 12:30 PM Medical Record Number: [id number] Patient Account Number: [id number] Date of Birth/Sex: 09/23/1940 (76 y.o. Male) Treating RN: Baruch Gouty, RN, BSN, Velva Harman Primary Care Madlynn Lundeen: Enid Derry Other Clinician: Referring [place] Inthavong: Enid Derry Treating Emonte Dieujuste/Extender: Melburn Hake, HOYT Weeks in Treatment: 23 Vital Signs Height(in): 67 Pulse(bpm): 79 Weight(lbs): 136 Blood Pressure 114/44 (mmHg): Body Mass Index(BMI): 21 Temperature(F): 98.1 Respiratory Rate 16 (breaths/min): Photos: [1:No Photos] [3:No Photos] [N/A:N/A] Wound Location: [1:Right Malleolus - Lateral] [3:Right Foot - Plantar] [N/A:N/A] Wounding Event: [1:Gradually Appeared] [3:Pressure Injury] [N/A:N/A] Primary Etiology: [1: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] [N/A:N/A] Secondary Etiology: [1:Diabetic Wound/Ulcer of the Lower Extremity] [3:N/A] [N/A:N/A] Comorbid History: [1:Cataracts, Chronic sinus problems/congestion, Anemia, Sleep Apnea, Hypertension, Myocardial Infarction, Type II Diabetes, Received Chemotherapy, Received Radiation] [3:Cataracts, Chronic sinus problems/congestion, Anemia, Sleep  Apnea, Hypertension, Myocardial Infarction, Type  II Diabetes, Received Chemotherapy, Received Radiation] [N/A:N/A] Date Acquired: [1:01/09/2016] [3:06/15/2016] [N/A:N/A] Weeks of Treatment: [1:23] [3:9] [N/A:N/A] Wound Status: [1:Open] [3:Open] [N/A:N/A] Measurements L x W x D 0.9x0.9x0.1 [3:1.2x1.4x0.1] [N/A:N/A] (cm) Area (cm) : [1:0.636] [3:1.319] [N/A:N/A] Volume (cm) : [1:0.064] [3:0.132] [N/A:N/A] % Reduction in Area: [1:-169.50%] [3:25.40%] [N/A:N/A] % Reduction in Volume: -166.70% [3:95.00%] [N/A:N/A] Classification: [1:Category/Stage III] [3:Grade 1] [N/A:N/A] HBO Classification: [1:Grade 1] [3:N/A] [N/A:N/A] Exudate Amount: [1:Large] [3:Large] [N/A:N/A] Exudate Type: [1:Serous] [3:Serous] [N/A:N/A] Exudate Color: [1:amber] [3:amber] [N/A:N/A] Wound Margin: [1:Flat and Intact] [3:Distinct, outline attached] [N/A:N/A] Granulation Amount: Large (67-100%) Small (1-33%) N/A Granulation Quality: Pink, Pale Pink, Pale N/A Necrotic Amount: Small (1-33%) Medium (34-66%) N/A Exposed Structures: Fat Layer (Subcutaneous Fat Layer (Subcutaneous N/A Tissue) Exposed: Yes Tissue) Exposed: Yes Fascia: No Fascia: No Tendon: No Tendon: No Muscle: No Muscle: No Joint: No Joint: No Bone: No Bone: No Epithelialization: None None N/A Periwound Skin Texture: Induration: Yes Excoriation: No N/A Induration: No Callus: No Crepitus: No Rash: No Scarring: No Periwound Skin Maceration: Yes Maceration: Yes N/A Moisture: Dry/Scaly: No Dry/Scaly: No Periwound Skin Color: Erythema: Yes Atrophie Blanche: No N/A Cyanosis: No Ecchymosis: No Erythema: No Hemosiderin Staining: No Mottled: No Pallor: No Rubor: No Erythema Location: Circumferential N/A N/A Erythema Change: Decreased N/A N/A Temperature: No Abnormality No Abnormality N/A Tenderness on Yes Yes N/A Palpation: Wound Preparation: Ulcer Cleansing: Ulcer Cleansing: N/A Rinsed/Irrigated with Rinsed/Irrigated with Saline Saline Topical Anesthetic Topical  Anesthetic Applied: Other: lidocaine Applied: Other: lidocaine 4% 4% Treatment Notes Electronic Signature(s) Signed: 08/18/2016 1:51:46 PM By: Regan Lemming BSN, RN Entered By: Regan Lemming on 08/18/2016 12:51:13 [name] ([id number]) -------------------------------------------------------------------------------- [place] Details Patient Name: [name], [name] Date of Service: 08/18/2016 12:30 PM Medical Record Number: [id number] Patient Account Number: [id number] Date of Birth/Sex: [birth year] (76 y.o. Male) Treating RN: Baruch Gouty, RN, BSN, Velva Harman Primary Care Karlon Schlafer: Enid Derry Other Clinician: Referring Dell Briner: Enid Derry Treating Daphnie Venturini/Extender: Melburn Hake, HOYT Weeks in Treatment: 65 Active Inactive ` Orientation to the Wound Care Program Nursing Diagnoses: Knowledge deficit related to the wound healing center program Goals: Patient/caregiver will verbalize understanding of the [place] Program Date Initiated: 03/10/2016 Target Resolution Date: 03/23/2016 Goal Status: Active Interventions: Provide education on orientation to the wound center Notes: ` Soft Tissue Infection Nursing Diagnoses: Impaired tissue integrity Potential for infection: soft tissue Goals: Patient will remain free of wound infection Date Initiated: 03/10/2016 Target Resolution Date: 03/17/2016 Goal Status: Active Interventions: Assess signs and symptoms of infection every visit Notes: ` Wound/Skin Impairment Nursing Diagnoses: Impaired tissue integrity Kolinski,  ADI DORO ([id number]) Goals: Ulcer/skin breakdown will heal within 14 weeks Date Initiated: 03/10/2016 Target Resolution Date: 03/24/2016 Goal Status: Active Interventions: Assess patient/caregiver ability to obtain necessary supplies Notes: Electronic Signature(s) Signed: 08/18/2016 1:51:46 PM By: Regan Lemming BSN, RN Entered By: Regan Lemming on 08/18/2016 12:51:04 [name]  ([id number]) -------------------------------------------------------------------------------- Pain Assessment Details Patient Name: [name] Date of Service: 08/18/2016 12:30 PM Medical Record Number: [id number] Patient Account Number: [id number] Date of Birth/Sex: 1940-06-14 (76 y.o. Male) Treating RN: Baruch Gouty, RN, BSN, Velva Harman Primary Care Dreonna Hussein: En[place] Derry Other Clinician: Referring Norina Cowper: [place] Derry Treating Merrick Feutz/Extender: Melburn Hake, HOYT Weeks in Treatment: 23 Active Problems Location of Pain Severity and Description of Pain Patient Has Paino No Site Locations With Dressing Change: No Pain Management and Medication Current Pain Management: Electronic Signature(s) Signed: 08/18/2016 1:51:46 PM By: Regan Lemming BSN, RN Entered By: Regan Lemming on 08/18/2016 12:37:55 [name] ([id number]) -------------------------------------------------------------------------------- Patient/Caregiver Education Details Patient Name: [name] Date of Service: 08/18/2016 12:30 PM Medical Record Number: [id number] Patient Account Number: [id number] Date of Birth/Gender: 01/08/1941 (76 y.o. Male) Treating RN: Baruch Gouty, RN, BSN, Velva Harman Primary Care Physician: Enid Derry Other Clinician: Referring Physician: Enid Derry Treating Physician/Extender: Sharalyn Ink in Treatment: 46 Education Assessment Education Provided To: Patient Education Topics Provided Welcome To The [facility]: Methods: Explain/Verbal Responses: Reinforcements needed Electronic Signature(s) Signed: 08/18/2016 1:51:46 PM By: Regan Lemming BSN, RN Entered By: Regan Lemming on 08/18/2016 13:25:06 [name] ([id number]) -------------------------------------------------------------------------------- Wound Assessment Details Patient Name: [name] Date of Service: 08/18/2016 12:30 PM Medical Record Number: [id number] Patient Account Number: [id number] Date of Birth/Sex:  01-31-1941 (76 y.o. Male) Treating RN: Baruch Gouty, RN, BSN, [place] Primary Care Kirsta Probert: En[place] Derry Other Clinician: Referring Evelise Reine: [place] Derry Treating Rayshad Riviello/Extender: Melburn Hake, HOYT Weeks in Treatment: 23 Wound Status Wound Number: 1 Primary Pressure Ulcer Etiology: Wound Location: Right Malleolus - Lateral Secondary Diabetic Wound/Ulcer of the Lower Wounding Event: Gradually Appeared Etiology: Extremity Date Acquired: 01/09/2016 Wound Open Weeks Of Treatment: 23 Status: Clustered Wound: No Comorbid Cataracts, Chronic sinus History: problems/congestion, Anemia, Sleep Apnea, Hypertension, Myocardial Infarction, Type II Diabetes, Received Chemotherapy, Received Radiation Photos Photo Uploaded By: Regan Lemming on 08/18/2016 13:53:48 Wound Measurements Length: (cm) 0.9 Width: (cm) 0.9 Depth: (cm) 0.1 Area: (cm) 0.636 Volume: (cm) 0.064 % Reduction in Area: -169.5% % Reduction in Volume: -166.7% Epithelialization: None Tunneling: No Undermining: No Wound Description 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 Severity (Wagner): Grade 1 Slough/Fibrin Wound Margin: Flat and Intact Exudate Amount: Large Exudate Type: Serous Exudate Color: amber [name], [name] ([id number]) er Cleansing: No o Yes Wound Bed Granulation Amount: Large (67-100%) Exposed Structure Granulation Quality: Pink, Pale Fascia Exposed: No Necrotic Amount: Small (1-33%) Fat Layer (Subcutaneous Tissue) Exposed: Yes Necrotic Quality: Adherent Slough Tendon Exposed: No Muscle Exposed: No Joint Exposed: No Bone Exposed: No Periwound Skin Texture Texture Color No Abnormalities Noted: No No Abnormalities Noted: No Induration: Yes Erythema: Yes Erythema Location: Circumferential Moisture Erythema Change: Decreased No Abnormalities Noted: No Dry / Scaly: No Temperature / Pain Maceration: Yes Temperature: No Abnormality Tenderness on Palpation: Yes Wound  Preparation Ulcer Cleansing: Rinsed/Irrigated with Saline Topical Anesthetic Applied: Other: lidocaine 4%, Treatment Notes Wound #1 (Right, Lateral Malleolus) 1. Cleansed with: Clean wound with Normal Saline 4. Dressing Applied: Aquacel Ag 5. Secondary Dressing Applied Gauze and Kerlix/Conform 7. Secured with Recruitment consultant) Signed: 08/18/2016 1:51:46 PM By: Regan Lemming BSN, RN Entered By: Regan Lemming on 08/18/2016 12:50:03 [name],  [name] ([id number]) -------------------------------------------------------------------------------- Wound Assessment Details Patient Name: [name], [name]. Date of Service: 08/18/2016 12:30 PM Medical Record Number: [id number] Patient Account Number: [id number] Date of Birth/Sex: [birth year] (76 y.o. Male) Treating RN: Baruch Gouty, RN, BSN, [place] Primary Care Aviyana Sonntag: Enid Derry Other Clinician: Referring Hailley Byers: [place] Derry Treating Floretta Petro/Extender: Melburn Hake, HOYT Weeks in Treatment: 23 Wound Status Wound Number: 3 Primary Diabetic Wound/Ulcer of the Lower Etiology: Extremity Wound Location: Right Foot - Plantar Wound Open Wounding Event: Pressure Injury Status: Date Acquired: 06/15/2016 Comorbid Cataracts, Chronic sinus Weeks Of Treatment: 9 History: problems/congestion, Anemia, Sleep Clustered Wound: No Apnea, Hypertension, Myocardial Infarction, Type II Diabetes, Received Chemotherapy, Received Radiation Photos Photo Uploaded By: Regan Lemming on 08/18/2016 13:54:37 Wound Measurements Length: (cm) 1.2 Width: (cm) 1.4 Depth: (cm) 0.1 Area: (cm) 1.319 Volume: (cm) 0.132 % Reduction in Area: 25.4% % Reduction in Volume: 95% Epithelialization: None Tunneling: No Undermining: No Wound Description Classification: Grade 1 Foul Odor Aft Wound Margin: Distinct, outline attached Slough/Fibrin Exudate Amount: Large Exudate Type: Serous Exudate Color: amber er Cleansing: No o Yes Wound Bed Granulation Amount: Small  (1-33%) Exposed Structure Granulation Quality: Pink, Pale Fascia Exposed: No [name], [name] ([id number]) Necrotic Amount: Medium (34-66%) Fat Layer (Subcutaneous Tissue) Exposed: Yes Necrotic Quality: Adherent Slough Tendon Exposed: No Muscle Exposed: No Joint Exposed: No Bone Exposed: No Periwound Skin Texture Texture Color No Abnormalities Noted: No No Abnormalities Noted: No Callus: No Atrophie Blanche: No Crepitus: No Cyanosis: No Excoriation: No Ecchymosis: No Induration: No Erythema: No Rash: No Hemosiderin Staining: No Scarring: No Mottled: No Pallor: No Moisture Rubor: No No Abnormalities Noted: No Dry / Scaly: No Temperature / Pain Maceration: Yes Temperature: No Abnormality Tenderness on Palpation: Yes Wound Preparation Ulcer Cleansing: Rinsed/Irrigated with Saline Topical Anesthetic Applied: Other: lidocaine 4%, Treatment Notes Wound #3 (Right, Plantar Foot) 1. Cleansed with: Clean wound with Normal Saline 4. Dressing Applied: Aquacel Ag 5. Secondary Dressing Applied Gauze and Kerlix/Conform 7. Secured with Recruitment consultant) Signed: 08/18/2016 1:51:46 PM By: Regan Lemming BSN, RN Entered By: Regan Lemming on 08/18/2016 12:50:28 [name] ([id number]) -------------------------------------------------------------------------------- Vitals Details Patient Name: [name] Date of Service: 08/18/2016 12:30 PM Medical Record Number: [id number] Patient Account Number: [id number] Date of Birth/Sex: July 29, 1940 (76 y.o. Male) Treating RN: Baruch Gouty, RN, BSN, [place] Primary Care Roselia Snipe: Enid Derry Other Clinician: Referring Stellarose Cerny: [place] Derry Treating Analycia Khokhar/Extender: Melburn Hake, HOYT Weeks in Treatment: 23 Vital Signs Time Taken: 12:37 Temperature (F): 98.1 Height (in): 67 Pulse (bpm): 79 Weight (lbs): 136 Respiratory Rate (breaths/min): 16 Body Mass Index (BMI): 21.3 Blood Pressure (mmHg): 114/44 Reference Range: 80 -  120 mg / dl Electronic Signature(s) Signed: 08/18/2016 1:51:46 PM By: Regan Lemming BSN, RN Entered By: Regan Lemming on 08/18/2016 12:39:32

## 2016-08-19 NOTE — Progress Notes (Signed)
Jeremiah, Jensen ([id number]) Visit Report for 08/18/2016 Chief Complaint Document Details Patient Name: [name], [name]. Date of Service: 08/18/2016 12:30 PM Medical Record Number: [id number] Patient Account Number: [id number] Date of Birth/Sex: 1940/12/30 (76 y.o. Male) Treating RN: Baruch Gouty, RN, BSN, Velva Harman Primary Care Provider: Enid Derry Other Clinician: Referring Provider: Enid Derry Treating Provider/Extender: Melburn Hake, HOYT Weeks in Treatment: 23 Information Obtained from: Patient Chief Complaint Patient is at the clinic for treatment of an open pressure ulcer to the right lateral ankle Electronic Signature(s) Signed: 08/18/2016 4:47:40 PM By: Worthy Keeler PA-C Entered By: Worthy Keeler on 08/18/2016 13:27:25 [name] ([id number]) -------------------------------------------------------------------------------- HPI Details Patient Name: [name] Date of Service: 08/18/2016 12:30 PM Medical Record Number: [id number] Patient Account Number: [id number] Date of Birth/Sex: 1940/11/24 (76 y.o. Male) Treating RN: Baruch Gouty, RN, BSN, Velva Harman Primary Care Provider: Enid Derry Other Clinician: Referring Provider: Enid Derry Treating Provider/Extender: Melburn Hake, HOYT Weeks in Treatment: 23 History of Present Illness Location: Patient presents with an ulcer on the right lateral ankle. Quality: Patient reports experiencing a dull pain to affected area(s). Severity: Patient states wound are getting worse. Duration: Patient has had the wound for > 2 months prior to seeking treatment at the wound center Timing: Pain in wound is Intermittent (comes and goes Context: The wound appeared gradually over time Modifying Factors: Other treatment(s) tried include:local care as per the medical oncologist Associated Signs and Symptoms: Patient reports having:some pain and no discharge HPI Description: 76 year old patient has been referred by his medical oncologist Dr. Grayland Ormond, for a  right lateral malleolus ulcer has been there for several weeks. 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 He is currently on chemotherapy with FOLFOX and Herceptin. past medical history significant for diabetes mellitus, anemia, hypertension, sleep apnea, status post appendectomy, peripheral vascular catheterization( Port placement) in September 2017 by Dr. Delana Meyer. He is also receiving palliative radiation therapy and was seen by Dr. Donella Stade. 03/17/2016 -- - x-ray of the right ankle -- IMPRESSION: 1. Soft tissue wound noted over the lateral malleolus, no underlying bony abnormality. No acute bony abnormality. 2. Diffuse degenerative change. 3. Peripheral vascular disease. 05/05/2016 -- the patient's wife tells me that she has noticed a small superficial bruise on the left lateral ankle and wanted me to view this area. 05/12/2016 -- the patient is unable to afford Santyl ointment and we have been struggling over the last 8 weeks to use various alternatives. I have asked them today if they would be willing to use the snap vacuum system and she will let me know soon. 05/19/2016 -- the patient and the wife have declined the use of the snap the vacuum system. He does not want any debridement to be done today. His proteins have improved a bit. 06/08/2016 -- . Not seen the patient back for 3 weeks and in the meanwhile the wife has decided to use duoderm on the wound,-- given to her by a friend. The wound is macerated and has increased in size due to the moisture. 06/16/2016 -- besides the original wound he had on his right lateral malleolus he has a small area on the dorsum of his right foot which is possibly cause due to pressure. He now has a plantar wound on the first metatarsal head on the right foot where his podiatrist Dr. Vickki Muff had debrided a callus yesterday. 08/04/16 on evaluation today patient wounds of the right lower extremity  appear  to be doing better. He is tolerating the dressing's although there is some maceration on the lateral foot wound. Overall there's no evidence of infection. 08/11/2016 -- he has had his first application of Grafix to the right lateral ankle. 08/14/2016 -- the patient was brought in by his wife today as he was complaining of pain over his right ankle where the graphics was applied a few days ago. Note is made of the fact that he had had a fall with a slight ERIC, NEES. ([id number]) blunt injury to the ankle. 08/18/16 on evaluation patient has a small new opening over the right lateral ankle compared to last week's evaluation. There's also increased erythema which appears to be consistent with an infection in my opinion. He is also having increased pain. He apparently did have a fall and some of the increased pain could be from the fall although this may also be due to a wound infection. Unfortunately the fall is complicating the situation as far as knowing exactly where the pain is coming from. Electronic Signature(s) Signed: 08/18/2016 4:47:40 PM By: Worthy Keeler PA-C Entered By: Worthy Keeler on 08/18/2016 13:29:27 [name] ([id number]) -------------------------------------------------------------------------------- Physical Exam Details Patient Name: [name], [name] Date of Service: 08/18/2016 12:30 PM Medical Record Number: [id number] Patient Account Number: [id number] Date of Birth/Sex: 1940/10/25 (76 y.o. Male) Treating RN: Baruch Gouty, RN, BSN, Velva Harman Primary Care Provider: Enid Derry Other Clinician: Referring Provider: Enid Derry Treating Provider/Extender: Melburn Hake, HOYT Weeks in Treatment: 23 Constitutional Thin and well-hydrated in no acute distress. Respiratory normal breathing without difficulty. clear to auscultation bilaterally. Cardiovascular regular rate and rhythm with normal S1, S2. Psychiatric this patient is able to make decisions and demonstrates  good insight into disease process. Alert and Oriented x 3. pleasant and cooperative. Notes Patient's wound does not appear to be slough covered although unfortunately there is a superficial wounds noted at the roughly 3 o'clock location on evaluation today in regard to the lateral ankle wound. There is also significant erythema surrounding this area along with tenderness even to light palpation. Electronic Signature(s) Signed: 08/18/2016 4:47:40 PM By: Worthy Keeler PA-C Entered By: Worthy Keeler on 08/18/2016 13:30:34 [name] ([id number]) -------------------------------------------------------------------------------- Physician Orders Details Patient Name: [name] Date of Service: 08/18/2016 12:30 PM Medical Record Number: [id number] Patient Account Number: [id number] Date of Birth/Sex: 1940-09-23 (76 y.o. Male) Treating RN: Baruch Gouty, RN, BSN, [place] Primary Care Provider: Enid Derry Other Clinician: Referring Provider: Enid Derry Treating Provider/Extender: Melburn Hake, HOYT Weeks in Treatment: 45 Verbal / Phone Orders: No Diagnosis Coding ICD-10 Coding Code Description E11.621 Type 2 diabetes mellitus with foot ulcer L89.512 Pressure ulcer of right ankle, stage 2 L97.512 Non-pressure chronic ulcer of other part of right foot with fat layer exposed 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 Wound Cleansing Wound #1 Right,Lateral Malleolus o Clean wound with Normal Saline. o Cleanse wound with mild soap and water Wound #3 Right,Plantar Foot o Clean wound with Normal Saline. o Cleanse wound with mild soap and water Anesthetic Wound #1 Right,Lateral Malleolus o Topical Lidocaine 4% cream applied to wound bed prior to debridement Wound #3 Right,Plantar Foot o Topical Lidocaine 4% cream applied to wound bed prior to debridement Skin Barriers/Peri-Wound Care Wound #1 Right,Lateral Malleolus o Antifungal cream - LOTRISONSE.Marland Kitchenapply around wound on  reddened areas as needed Primary Wound Dressing Wound #1 Right,Lateral Malleolus o Aquacel Ag Wound #3 Right,Plantar Foot o Aquacel Ag Secondary Dressing KEANTHONY, POOLE ([id number]) Wound #1 Right,Lateral Malleolus o  Dry Gauze o Conform/Kerlix Wound #3 Right,Plantar Foot o Dry Gauze o Conform/Kerlix o Drawtex Dressing Change Frequency Wound #1 Right,Lateral Malleolus o Change dressing every day. Follow-up Appointments Wound #1 Right,Lateral Malleolus o Return Appointment in 1 week. Edema Control Wound #1 Right,Lateral Malleolus o Elevate legs to the level of the heart and pump ankles as often as possible Off-Loading Wound #1 Right,Lateral Malleolus o Other: - Keep pressure off the malleolus Wound #3 Right,Plantar Foot o Other: - Keep pressure off the plantar foot Additional Orders / Instructions Wound #1 Right,Lateral Malleolus o Increase protein intake. o Activity as tolerated Medications-please add to medication list. Wound #1 Right,Lateral Malleolus o P.O. Antibiotics - Doxy 100 BID x 10 days o Other: - Include these in your diet...VITAMIN A, C, ZINC, MVI Notes Due to the discomfort that this patient is experiencing today and the likelihood of infection I'm going to hold off on applying the Grafix at this point. We will see how he does over the next week with the initiation of doxycycline and I did confirm with what we had on file that he is not allergic to the doxycycline. If he is doing better next week in regard to pain and erythema I do believe we could potentially consider doing the Grafix at that point. Patient's wife was present during evaluation today and everything was explained to her in detail. We will see him for re-evaluation in 1 week to see where thing stand. GLADYS, GUTMAN ([id number]) Electronic Signature(s) Signed: 08/18/2016 4:47:40 PM By: Worthy Keeler PA-C Entered By: Worthy Keeler on 08/18/2016 13:35:41 [name] ([id number]) -------------------------------------------------------------------------------- Problem List Details Patient Name: DUELL, HOLDREN Date of Service: 08/18/2016 12:30 PM Medical Record Number: [id number] Patient Account Number: [id number] Date of Birth/Sex: 1941-01-22 (76 y.o. Male) Treating RN: Baruch Gouty, RN, BSN, Velva Harman Primary Care Provider: Enid Derry Other Clinician: Referring Provider: Enid Derry Treating Provider/Extender: Melburn Hake, HOYT Weeks in Treatment: 23 Active Problems ICD-10 Encounter Code Description Active Date Diagnosis E11.621 Type 2 diabetes mellitus with foot ulcer 05/05/2016 Yes L89.512 Pressure ulcer of right ankle, stage 2 05/05/2016 Yes L97.512 Non-pressure chronic ulcer of other part of right foot with 08/04/2016 Yes fat layer exposed 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 05/05/2016 Yes Inactive Problems Resolved Problems ICD-10 Code Description Active Date Resolved Date L89.521 Pressure ulcer of left ankle, stage 1 05/05/2016 05/05/2016 Electronic Signature(s) Signed: 08/18/2016 4:47:40 PM By: Worthy Keeler PA-C Entered By: Worthy Keeler on 08/18/2016 13:08:51 [name] ([id number]) -------------------------------------------------------------------------------- Progress Note Details Patient Name: [name] Date of Service: 08/18/2016 12:30 PM Medical Record Number: [id number] Patient Account Number: [id number] Date of Birth/Sex: Nov 17, 1940 (76 y.o. Male) Treating RN: Baruch Gouty, RN, BSN, Velva Harman Primary Care Provider: Enid Derry Other Clinician: Referring Provider: Enid Derry Treating Provider/Extender: Melburn Hake, HOYT Weeks in Treatment: 23 Subjective Chief Complaint Information obtained from Patient Patient is at the clinic for treatment of an open pressure ulcer to the right lateral ankle History of Present Illness (HPI) The following HPI elements were documented for the patient's wound: Location: Patient  presents with an ulcer on the right lateral ankle. Quality: Patient reports experiencing a dull pain to affected area(s). Severity: Patient states wound are getting worse. Duration: Patient has had the wound for > 2 months prior to seeking treatment at the wound center Timing: Pain in wound is Intermittent (comes and goes Context: The wound appeared gradually over time Modifying Factors: Other treatment(s) tried include:local care as per the medical oncologist Associated  Signs and Symptoms: Patient reports having:some pain and no discharge 76 year old patient has been referred by his medical oncologist Dr. Grayland Ormond, for a right lateral malleolus ulcer has been there for several weeks. 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 He is currently on chemotherapy with FOLFOX and Herceptin. past medical history significant for diabetes mellitus, anemia, hypertension, sleep apnea, status post appendectomy, peripheral vascular catheterization( Port placement) in September 2017 by Dr. Delana Meyer. He is also receiving palliative radiation therapy and was seen by Dr. Donella Stade. 03/17/2016 -- - x-ray of the right ankle -- IMPRESSION: 1. Soft tissue wound noted over the lateral malleolus, no underlying bony abnormality. No acute bony abnormality. 2. Diffuse degenerative change. 3. Peripheral vascular disease. 05/05/2016 -- the patient's wife tells me that she has noticed a small superficial bruise on the left lateral ankle and wanted me to view this area. 05/12/2016 -- the patient is unable to afford Santyl ointment and we have been struggling over the last 8 weeks to use various alternatives. I have asked them today if they would be willing to use the snap vacuum system and she will let me know soon. 05/19/2016 -- the patient and the wife have declined the use of the snap the vacuum system. He does not want any debridement to be done today. His proteins  have improved a bit. 06/08/2016 -- . Not seen the patient back for 3 weeks and in the meanwhile the wife has decided to use duoderm on the wound,-- given to her by a friend. The wound is macerated and has increased in size due to the moisture. 06/16/2016 -- besides the original wound he had on his right lateral malleolus he has a small area on the MALIKAI, GUT. ([id number]) dorsum of his right foot which is possibly cause due to pressure. He now has a plantar wound on the first metatarsal head on the right foot where his podiatrist Dr. Vickki Muff had debrided a callus yesterday. 08/04/16 on evaluation today patient wounds of the right lower extremity appear to be doing better. He is tolerating the dressing's although there is some maceration on the lateral foot wound. Overall there's no evidence of infection. 08/11/2016 -- he has had his first application of Grafix to the right lateral ankle. 08/14/2016 -- the patient was brought in by his wife today as he was complaining of pain over his right ankle where the graphics was applied a few days ago. Note is made of the fact that he had had a fall with a slight blunt injury to the ankle. 08/18/16 on evaluation patient has a small new opening over the right lateral ankle compared to last week's evaluation. There's also increased erythema which appears to be consistent with an infection in my opinion. He is also having increased pain. He apparently did have a fall and some of the increased pain could be from the fall although this may also be due to a wound infection. Unfortunately the fall is complicating the situation as far as knowing exactly where the pain is coming from. Allergies sufa drugs, Cephalosporins, lisinopril, losartin, Keflex, amoxicillin, ampicillin, Septra, Bactrim Objective Constitutional Thin and well-hydrated in no acute distress. Vitals Time Taken: 12:37 PM, Height: 67 in, Weight: 136 lbs, BMI: 21.3, Temperature: 98.1 F, Pulse:  79 bpm, Respiratory Rate: 16 breaths/min, Blood Pressure: 114/44 mmHg. Respiratory normal breathing without difficulty. clear to auscultation bilaterally. Cardiovascular regular rate and rhythm with normal S1, S2. Psychiatric this  patient is able to make decisions and demonstrates good insight into disease process. Alert and Oriented x 3. pleasant and cooperative. General Notes: Patient's wound does not appear to be slough covered although unfortunately there is a superficial wounds noted at the roughly 3 o'clock location on evaluation today in regard to the lateral ankle wound. There is also significant erythema surrounding this area along with tenderness even to light ADEN, SEK ([id number]) palpation. Integumentary (Hair, Skin) Wound #1 status is Open. Original cause of wound was Gradually Appeared. The wound is located on the Right,Lateral Malleolus. The wound measures 0.9cm length x 0.9cm width x 0.1cm depth; 0.636cm^2 area and 0.064cm^3 volume. There is Fat Layer (Subcutaneous Tissue) Exposed exposed. There is no tunneling or undermining noted. There is a large amount of serous drainage noted. The wound margin is flat and intact. There is large (67-100%) pink, pale granulation within the wound bed. There is a small (1-33%) amount of necrotic tissue within the wound bed including Adherent Slough. The periwound skin appearance exhibited: Induration, Maceration, Erythema. The periwound skin appearance did not exhibit: Dry/Scaly. The surrounding wound skin color is noted with erythema which is circumferential. Periwound temperature was noted as No Abnormality. The periwound has tenderness on palpation. Wound #3 status is Open. Original cause of wound was Pressure Injury. The wound is located on the [place]. The wound measures 1.2cm length x 1.4cm width x 0.1cm depth; 1.319cm^2 area and 0.132cm^3 volume. There is Fat Layer (Subcutaneous Tissue) Exposed exposed. There is no  tunneling or undermining noted. There is a large amount of serous drainage noted. The wound margin is distinct with the outline attached to the wound base. There is small (1-33%) pink, pale granulation within the wound bed. There is a medium (34-66%) amount of necrotic tissue within the wound bed including Adherent Slough. The periwound skin appearance exhibited: Maceration. The periwound skin appearance did not exhibit: Callus, Crepitus, Excoriation, Induration, Rash, Scarring, Dry/Scaly, Atrophie Blanche, Cyanosis, Ecchymosis, Hemosiderin Staining, Mottled, Pallor, Rubor, Erythema. Periwound temperature was noted as No Abnormality. The periwound has tenderness on palpation. 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 Cleanse wound with mild soap and water Wound #3 Right,Plantar Foot: Clean wound with Normal Saline. DAMEER, SPEISER ([id number]) Cleanse wound with mild soap and water Anesthetic: Wound #1 Right,Lateral Malleolus: Topical Lidocaine 4% cream applied to wound bed prior to debridement Wound #3 Right,Plantar Foot: Topical Lidocaine 4% cream applied to wound bed prior to debridement Skin Barriers/Peri-Wound Care: Wound #1 Right,Lateral Malleolus: Antifungal cream - LOTRISONSE.Marland Kitchenapply around wound on reddened areas as needed Primary Wound Dressing: Wound #1 Right,Lateral Malleolus: Aquacel Ag Wound #3 Right,Plantar Foot: Aquacel Ag Secondary Dressing: Wound #1 Right,Lateral Malleolus: Dry Gauze Conform/Kerlix Wound #3 Right,Plantar Foot: Dry Gauze Conform/Kerlix Drawtex Dressing Change Frequency: Wound #1 Right,Lateral Malleolus: Change dressing every  day. Follow-up Appointments: Wound #1 Right,Lateral Malleolus: Return Appointment in 1 week. Edema Control: Wound #1 Right,Lateral Malleolus: Elevate legs to the level of the heart and pump ankles as often as possible Off-Loading: Wound #1 Right,Lateral Malleolus: Other: - Keep pressure off the malleolus Wound #3 Right,Plantar Foot: Other: - Keep pressure off the plantar foot Additional Orders / Instructions: Wound #1 Right,Lateral Malleolus: Increase protein intake. Activity as  tolerated Medications-please add to medication list.: Wound #1 Right,Lateral Malleolus: P.O. Antibiotics - Doxy 100 BID x 10 days Other: - Include these in your diet...VITAMIN A, C, ZINC, MVI General Notes: Due to the discomfort that this patient is experiencing today and the likelihood of infection I'm going to hold off on applying the Grafix at this point. We will see how he does over the next week with the initiation of doxycycline and I did confirm with what we had on file that he is not allergic to the doxycycline. If he is doing better next week in regard to pain and erythema I do believe we could potentially consider doing the Grafix at that point. Patient's wife was present during evaluation today and everything was explained to her in detail. We will see him for re-evaluation in 1 week to see where thing stand. TYGE, SOMERS ([id number]) Electronic Signature(s) Signed: 08/18/2016 4:47:40 PM By: Worthy Keeler PA-C Entered By: Worthy Keeler on 08/18/2016 13:36:00 [name] ([id number]) -------------------------------------------------------------------------------- SuperBill Details Patient Name: [name] Date of Service: 08/18/2016 Medical Record Number: [id number] Patient Account Number: [id number] Date of Birth/Sex: Nov 25, 1940 (76 y.o. Male) Treating RN: Baruch Gouty, RN, BSN, Needles Primary Care Provider: Enid Derry Other Clinician: Referring Provider: Enid Derry Treating  Provider/Extender: Melburn Hake, HOYT Weeks in Treatment: 23 Diagnosis Coding ICD-10 Codes Code Description E11.621 Type 2 diabetes mellitus with foot ulcer L89.512 Pressure ulcer of right ankle, stage 2 L97.512 Non-pressure chronic ulcer of other part of right foot with fat layer exposed 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 Facility Procedures CPT4 Code: [id number] Description: 99213 - WOUND CARE VISIT-LEV 3 EST PT Modifier: Quantity: 1 Physician Procedures CPT4 Code Description: [id number] - WC PHYS LEVEL 3 - EST PT ICD-10 Description Diagnosis E11.621 Type 2 diabetes mellitus with foot ulcer L89.512 Pressure ulcer of right ankle, stage 2 L97.512 Non-pressure chronic ulcer of other part of right fo  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 Modifier: ot with fat lay Quantity: 1 er exposed Electronic Signature(s) Signed: 08/18/2016 4:47:40 PM By: Worthy Keeler PA-C Entered By: Worthy Keeler on 08/18/2016 13:36:26

## 2016-08-23 DIAGNOSIS — C787 Secondary malignant neoplasm of liver and intrahepatic bile duct: Secondary | ICD-10-CM | POA: Diagnosis not present

## 2016-08-23 DIAGNOSIS — D509 Iron deficiency anemia, unspecified: Secondary | ICD-10-CM | POA: Diagnosis not present

## 2016-08-23 DIAGNOSIS — F0390 Unspecified dementia without behavioral disturbance: Secondary | ICD-10-CM | POA: Diagnosis not present

## 2016-08-23 DIAGNOSIS — R634 Abnormal weight loss: Secondary | ICD-10-CM | POA: Diagnosis not present

## 2016-08-23 DIAGNOSIS — R1310 Dysphagia, unspecified: Secondary | ICD-10-CM | POA: Diagnosis not present

## 2016-08-23 DIAGNOSIS — C155 Malignant neoplasm of lower third of esophagus: Secondary | ICD-10-CM | POA: Diagnosis not present

## 2016-08-25 ENCOUNTER — Encounter: Admitting: Physician Assistant

## 2016-08-25 ENCOUNTER — Other Ambulatory Visit
Admission: RE | Admit: 2016-08-25 | Discharge: 2016-08-25 | Disposition: A | Discharge status: Home / Self Care | Source: Other Acute Inpatient Hospital | Attending: Physician Assistant | Admitting: Physician Assistant

## 2016-08-25 DIAGNOSIS — S91001A Unspecified open wound, right ankle, initial encounter: Secondary | ICD-10-CM | POA: Diagnosis not present

## 2016-08-25 DIAGNOSIS — L97319 Non-pressure chronic ulcer of right ankle with unspecified severity: Secondary | ICD-10-CM | POA: Insufficient documentation

## 2016-08-25 DIAGNOSIS — R238 Other skin changes: Secondary | ICD-10-CM | POA: Insufficient documentation

## 2016-08-25 DIAGNOSIS — E11621 Type 2 diabetes mellitus with foot ulcer: Secondary | ICD-10-CM | POA: Diagnosis not present

## 2016-08-25 DIAGNOSIS — S91301A Unspecified open wound, right foot, initial encounter: Secondary | ICD-10-CM | POA: Diagnosis not present

## 2016-08-28 ENCOUNTER — Other Ambulatory Visit: Payer: Self-pay | Admitting: Physician Assistant

## 2016-08-28 ENCOUNTER — Ambulatory Visit
Admission: RE | Admit: 2016-08-28 | Discharge: 2016-08-28 | Disposition: A | Discharge status: Home / Self Care | Source: Ambulatory Visit | Attending: Physician Assistant | Admitting: Physician Assistant

## 2016-08-28 DIAGNOSIS — F0390 Unspecified dementia without behavioral disturbance: Secondary | ICD-10-CM | POA: Diagnosis not present

## 2016-08-28 DIAGNOSIS — L98499 Non-pressure chronic ulcer of skin of other sites with unspecified severity: Secondary | ICD-10-CM | POA: Insufficient documentation

## 2016-08-28 DIAGNOSIS — C155 Malignant neoplasm of lower third of esophagus: Secondary | ICD-10-CM | POA: Diagnosis not present

## 2016-08-28 DIAGNOSIS — C787 Secondary malignant neoplasm of liver and intrahepatic bile duct: Secondary | ICD-10-CM | POA: Diagnosis not present

## 2016-08-28 DIAGNOSIS — R52 Pain, unspecified: Secondary | ICD-10-CM

## 2016-08-28 DIAGNOSIS — D509 Iron deficiency anemia, unspecified: Secondary | ICD-10-CM | POA: Diagnosis not present

## 2016-08-28 DIAGNOSIS — X58XXXA Exposure to other specified factors, initial encounter: Secondary | ICD-10-CM | POA: Insufficient documentation

## 2016-08-28 DIAGNOSIS — R634 Abnormal weight loss: Secondary | ICD-10-CM | POA: Diagnosis not present

## 2016-08-28 DIAGNOSIS — R1310 Dysphagia, unspecified: Secondary | ICD-10-CM | POA: Diagnosis not present

## 2016-08-28 DIAGNOSIS — S91001A Unspecified open wound, right ankle, initial encounter: Secondary | ICD-10-CM | POA: Insufficient documentation

## 2016-08-28 DIAGNOSIS — L97319 Non-pressure chronic ulcer of right ankle with unspecified severity: Secondary | ICD-10-CM | POA: Diagnosis not present

## 2016-08-28 LAB — AEROBIC CULTURE  (SUPERFICIAL SPECIMEN)

## 2016-08-28 LAB — AEROBIC CULTURE W GRAM STAIN (SUPERFICIAL SPECIMEN)

## 2016-08-28 NOTE — Progress Notes (Signed)
RENO, CLASBY ([id number]) Visit Report for 08/25/2016 Chief Complaint Document Details Patient Name: [name], [name]. Date of Service: 08/25/2016 2:00 PM Medical Record Number: [id number] Patient Account Number: [id number] Date of Birth/Sex: 03/20/1940 (76 y.o. Male) Treating RN: Baruch Gouty, RN, BSN, Velva Harman Primary Care Provider: Enid Derry Other Clinician: Referring Provider: Enid Derry Treating Provider/Extender: Melburn Hake, Edel Rivero Weeks in Treatment: 24 Information Obtained from: Patient Chief Complaint Patient is at the clinic for treatment of an open pressure ulcer to the right lateral ankle Electronic Signature(s) Signed: 08/25/2016 6:13:19 PM By: Worthy Keeler PA-C Entered By: Worthy Keeler on 08/25/2016 14:20:19 [name] ([id number]) -------------------------------------------------------------------------------- HPI Details Patient Name: [name] Date of Service: 08/25/2016 2:00 PM Medical Record Number: [id number] Patient Account Number: [id number] Date of Birth/Sex: [birth year] (76 y.o. Male) Treating RN: Baruch Gouty, RN, BSN, Velva Harman Primary Care Provider: Enid Derry Other Clinician: Referring Provider: Enid Derry Treating Provider/Extender: Melburn Hake, Susen Haskew Weeks in Treatment: 24 History of Present Illness Location: Patient presents with an ulcer on the right lateral ankle. Quality: Patient reports experiencing a dull pain to affected area(s). Severity: Patient states wound are getting worse. Duration: Patient has had the wound for > 2 months prior to seeking treatment at the wound center Timing: Pain in wound is Intermittent (comes and goes Context: The wound appeared gradually over time Modifying Factors: Other treatment(s) tried include:local care as per the medical oncologist Associated Signs and Symptoms: Patient reports having:some pain and no discharge HPI Description: 76 year old patient has been referred by his medical oncologist Dr. Grayland Ormond, for a  right lateral malleolus ulcer has been there for several weeks. 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 He is currently on chemotherapy with FOLFOX and Herceptin. past medical history significant for diabetes mellitus, anemia, hypertension, sleep apnea, status post appendectomy, peripheral vascular catheterization( Port placement) in September 2017 by Dr. Delana Meyer. He is also receiving palliative radiation therapy and was seen by Dr. Donella Stade. 03/17/2016 -- - x-ray of the right ankle -- IMPRESSION: 1. Soft tissue wound noted over the lateral malleolus, no underlying bony abnormality. No acute bony abnormality. 2. Diffuse degenerative change. 3. Peripheral vascular disease. 05/05/2016 -- the patient's wife tells me that she has noticed a small superficial bruise on the left lateral ankle and wanted me to view this area. 05/12/2016 -- the patient is unable to afford Santyl ointment and we have been struggling over the last 8 weeks to use various alternatives. I have asked them today if they would be willing to use the snap vacuum system and she will let me know soon. 05/19/2016 -- the patient and the wife have declined the use of the snap the vacuum system. He does not want any debridement to be done today. His proteins have improved a bit. 06/08/2016 -- . Not seen the patient back for 3 weeks and in the meanwhile the wife has decided to use duoderm on the wound,-- given to her by a friend. The wound is macerated and has increased in size due to the moisture. 06/16/2016 -- besides the original wound he had on his right lateral malleolus he has a small area on the dorsum of his right foot which is possibly cause due to pressure. He now has a plantar wound on the first metatarsal head on the right foot where his podiatrist Dr. Vickki Muff had debrided a callus yesterday. 08/04/16 on evaluation today patient wounds of the right lower extremity  appear  to be doing better. He is tolerating the dressing's although there is some maceration on the lateral foot wound. Overall there's no evidence of infection. 08/11/2016 -- he has had his first application of Grafix to the right lateral ankle. 08/14/2016 -- the patient was brought in by his wife today as he was complaining of pain over his right ankle where the graphics was applied a few days ago. Note is made of the fact that he had had a fall with a slight JAVONNI, MACKE. ([id number]) blunt injury to the ankle. 08/18/16 on evaluation patient has a small new opening over the right lateral ankle compared to last week's evaluation. There's also increased erythema which appears to be consistent with an infection in my opinion. He is also having increased pain. He apparently did have a fall and some of the increased pain could be from the fall although this may also be due to a wound infection. Unfortunately the fall is complicating the situation as far as knowing exactly where the pain is coming from. 08/25/16 on evaluation today patient lateral malleolus wound appears to be doing a little bit better there's not much erythema surrounding the wound at this point. He is still having pain in ankle region Electronic Signature(s) Signed: 08/25/2016 6:13:19 PM By: Worthy Keeler PA-C Entered By: Worthy Keeler on 08/25/2016 16:42:39 [name] ([id number]) -------------------------------------------------------------------------------- Physical Exam Details Patient Name: [name] Date of Service: 08/25/2016 2:00 PM Medical Record Number: [id number] Patient Account Number: [id number] Date of Birth/Sex: Oct 31, 1940 (76 y.o. Male) Treating RN: Baruch Gouty, RN, BSN, Velva Harman Primary Care Provider: Enid Derry Other Clinician: Referring Provider: Enid Derry Treating Provider/Extender: Melburn Hake, Keiton Cosma Weeks in Treatment: 24 Constitutional Thin and well-hydrated in no acute  distress. Respiratory normal breathing without difficulty. clear to auscultation bilaterally. Cardiovascular regular rate and rhythm with normal S1, S2. Psychiatric this patient is able to make decisions and demonstrates good insight into disease process. Alert and Oriented x 3. pleasant and cooperative. Notes Patient lateral ankle wound appears to be doing a little bit better without as much angry erythema surrounding. He did not take the antibiotic as his wife was concerned about a possible sulfa allergy. Nonetheless the good news is he does seem to be doing better. The plantar foot wound appears to be doing about the same. Electronic Signature(s) Signed: 08/25/2016 6:13:19 PM By: Worthy Keeler PA-C Entered By: Worthy Keeler on 08/25/2016 16:51:08 [name] ([id number]) -------------------------------------------------------------------------------- Physician Orders Details Patient Name: [name] Date of Service: 08/25/2016 2:00 PM Medical Record Number: [id number] Patient Account Number: [id number] Date of Birth/Sex: 1940-04-06 (76 y.o. Male) Treating RN: Baruch Gouty, RN, BSN, [place] Primary Care Provider: Enid Derry Other Clinician: Referring Provider: Enid Derry Treating Provider/Extender: Melburn Hake, Bellanie Matthew Weeks in Treatment: 15 Verbal / Phone Orders: No Diagnosis Coding ICD-10 Coding Code Description E11.621 Type 2 diabetes mellitus with foot ulcer L89.512 Pressure ulcer of right ankle, stage 2 L97.512 Non-pressure chronic ulcer of other part of right foot with fat layer exposed 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 Wound Cleansing Wound #1 Right,Lateral Malleolus o Clean wound with Normal Saline. o Cleanse wound with mild soap and water Wound #3 Right,Plantar Foot o Clean wound with Normal Saline. o Cleanse wound with mild soap and water Anesthetic Wound #1 Right,Lateral Malleolus o Topical Lidocaine 4% cream applied to wound bed prior to  debridement Wound #3 Right,Plantar Foot o Topical Lidocaine 4% cream applied to wound bed prior to debridement Skin Barriers/Peri-Wound Care Wound #  1 Right,Lateral Malleolus o Antifungal cream - LOTRISONSE.Marland Kitchenapply around wound on reddened areas as needed Primary Wound Dressing Wound #1 Right,Lateral Malleolus o Aquacel Ag Wound #3 Right,Plantar Foot o Aquacel Ag Secondary Dressing SAYYID, HAREWOOD ([id number]) Wound #1 Right,Lateral Malleolus o Dry Gauze o Conform/Kerlix Wound #3 Right,Plantar Foot o Dry Gauze o Conform/Kerlix o Drawtex Dressing Change Frequency Wound #1 Right,Lateral Malleolus o Change dressing every day. Follow-up Appointments Wound #1 Right,Lateral Malleolus o Return Appointment in 1 week. Edema Control Wound #1 Right,Lateral Malleolus o Elevate legs to the level of the heart and pump ankles as often as possible Off-Loading Wound #1 Right,Lateral Malleolus o Other: - Keep pressure off the malleolus Wound #3 Right,Plantar Foot o Other: - Keep pressure off the plantar foot Additional Orders / Instructions Wound #1 Right,Lateral Malleolus o Increase protein intake. o Activity as tolerated Laboratory o Bacteria identified in Wound by Culture (MICRO) - right ankle oooo LOINC Code: 2694-8 oooo Convenience Name: Wound culture routine Radiology o X-ray, ankle - 2 plus view Notes REILY, ILIC ([id number]) I'm going to recommend that we go ahead and start a order for an x-ray of the right ankle. I also did perform a wound culture today just to be sure there is no infection growing and if there is to help [place] determine the best antibiotic for him. I'm not prescribing any antibiotics at this point. We will see him for reevaluation in one week to see were things stand at that point. If anything worsens in the interim patient will contact our office for additional recommendations. Electronic Signature(s) Signed: 08/25/2016  6:13:19 PM By: Worthy Keeler PA-C Previous Signature: 08/25/2016 4:24:08 PM Version By: Regan Lemming BSN, RN Entered By: Worthy Keeler on 08/25/2016 16:52:55 [name] ([id number]) -------------------------------------------------------------------------------- Problem List Details Patient Name: KYRIN, GRATZ Date of Service: 08/25/2016 2:00 PM Medical Record Number: [id number] Patient Account Number: [id number] Date of Birth/Sex: Jul 11, 1940 (76 y.o. Male) Treating RN: Baruch Gouty, RN, BSN, Velva Harman Primary Care Provider: Enid Derry Other Clinician: Referring Provider: Enid Derry Treating Provider/Extender: Melburn Hake, Dashonna Chagnon Weeks in Treatment: 24 Active Problems ICD-10 Encounter Code Description Active Date Diagnosis E11.621 Type 2 diabetes mellitus with foot ulcer 05/05/2016 Yes L89.512 Pressure ulcer of right ankle, stage 2 05/05/2016 Yes L97.512 Non-pressure chronic ulcer of other part of right foot with 08/04/2016 Yes fat layer exposed 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 05/05/2016 Yes Inactive Problems Resolved Problems ICD-10 Code Description Active Date Resolved Date L89.521 Pressure ulcer of left ankle, stage 1 05/05/2016 05/05/2016 Electronic Signature(s) Signed: 08/25/2016 6:13:19 PM By: Worthy Keeler PA-C Entered By: Worthy Keeler on 08/25/2016 16:37:06 [name] ([id number]) -------------------------------------------------------------------------------- Progress Note Details Patient Name: [name] Date of Service: 08/25/2016 2:00 PM Medical Record Number: [id number] Patient Account Number: [id number] Date of Birth/Sex: [birth year] (76 y.o. Male) Treating RN: Baruch Gouty, RN, BSN, Velva Harman Primary Care Provider: Enid Derry Other Clinician: Referring Provider: Enid Derry Treating Provider/Extender: Melburn Hake, Shan Valdes Weeks in Treatment: 24 Subjective Chief Complaint Information obtained from Patient Patient is at the clinic for treatment of an open  pressure ulcer to the right lateral ankle History of Present Illness (HPI) The following HPI elements were documented for the patient's wound: Location: Patient presents with an ulcer on the right lateral ankle. Quality: Patient reports experiencing a dull pain to affected area(s). Severity: Patient states wound are getting worse. Duration: Patient has had the wound for > 2 months prior to seeking treatment at the wound center Timing: Pain in  wound is Intermittent (comes and goes Context: The wound appeared gradually over time Modifying Factors: Other treatment(s) tried include:local care as per the medical oncologist Associated Signs and Symptoms: Patient reports having:some pain and no discharge 76 year old patient has been referred by his medical oncologist Dr. Grayland Ormond, for a right lateral malleolus ulcer has been there for several weeks. 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 He is currently on chemotherapy with FOLFOX and Herceptin. past medical history significant for diabetes mellitus, anemia, hypertension, sleep apnea, status post appendectomy, peripheral vascular catheterization( Port placement) in September 2017 by Dr. Delana Meyer. He is also receiving palliative radiation therapy and was seen by Dr. Donella Stade. 03/17/2016 -- - x-ray of the right ankle -- IMPRESSION: 1. Soft tissue wound noted over the lateral malleolus, no underlying bony abnormality. No acute bony abnormality. 2. Diffuse degenerative change. 3. Peripheral vascular disease. 05/05/2016 -- the patient's wife tells me that she has noticed a small superficial bruise on the left lateral ankle and wanted me to view this area. 05/12/2016 -- the patient is unable to afford Santyl ointment and we have been struggling over the last 8 weeks to use various alternatives. I have asked them today if they would be willing to use the snap vacuum system and she will let me know  soon. 05/19/2016 -- the patient and the wife have declined the use of the snap the vacuum system. He does not want any debridement to be done today. His proteins have improved a bit. 06/08/2016 -- . Not seen the patient back for 3 weeks and in the meanwhile the wife has decided to use duoderm on the wound,-- given to her by a friend. The wound is macerated and has increased in size due to the moisture. 06/16/2016 -- besides the original wound he had on his right lateral malleolus he has a small area on the KINNIE, KAUPP. ([id number]) dorsum of his right foot which is possibly cause due to pressure. He now has a plantar wound on the first metatarsal head on the right foot where his podiatrist Dr. Vickki Muff had debrided a callus yesterday. 08/04/16 on evaluation today patient wounds of the right lower extremity appear to be doing better. He is tolerating the dressing's although there is some maceration on the lateral foot wound. Overall there's no evidence of infection. 08/11/2016 -- he has had his first application of Grafix to the right lateral ankle. 08/14/2016 -- the patient was brought in by his wife today as he was complaining of pain over his right ankle where the graphics was applied a few days ago. Note is made of the fact that he had had a fall with a slight blunt injury to the ankle. 08/18/16 on evaluation patient has a small new opening over the right lateral ankle compared to last week's evaluation. There's also increased erythema which appears to be consistent with an infection in my opinion. He is also having increased pain. He apparently did have a fall and some of the increased pain could be from the fall although this may also be due to a wound infection. Unfortunately the fall is complicating the situation as far as knowing exactly where the pain is coming from. 08/25/16 on evaluation today patient lateral malleolus wound appears to be doing a little bit better there's not much  erythema surrounding the wound at this point. He is still having pain in ankle region Allergies sufa drugs, Cephalosporins, lisinopril, losartin, Keflex,  amoxicillin, ampicillin, Septra, Bactrim Objective Constitutional Thin and well-hydrated in no acute distress. Vitals Time Taken: 2:03 PM, Height: 67 in, Weight: 136 lbs, BMI: 21.3, Temperature: 98 F, Pulse: 81 bpm, Respiratory Rate: 16 breaths/min, Blood Pressure: 119/46 mmHg. Respiratory normal breathing without difficulty. clear to auscultation bilaterally. Cardiovascular regular rate and rhythm with normal S1, S2. Psychiatric this patient is able to make decisions and demonstrates good insight into disease process. Alert and Oriented x 3. pleasant and cooperative. General Notes: Patient lateral ankle wound appears to be doing a little bit better without as much angry SIMONE, TUCKEY ([id number]) erythema surrounding. He did not take the antibiotic as his wife was concerned about a possible sulfa allergy. Nonetheless the good news is he does seem to be doing better. The plantar foot wound appears to be doing about the same. Integumentary (Hair, Skin) Wound #1 status is Open. Original cause of wound was Gradually Appeared. The wound is located on the Right,Lateral Malleolus. The wound measures 0.8cm length x 1.5cm width x 0.2cm depth; 0.942cm^2 area and 0.188cm^3 volume. There is Fat Layer (Subcutaneous Tissue) Exposed exposed. There is no tunneling noted, however, there is undermining starting at 12:00 and ending at 12:00 with a maximum distance of 1cm. There is a large amount of serous drainage noted. The wound margin is flat and intact. There is small (1-33%) pink, pale granulation within the wound bed. There is a large (67-100%) amount of necrotic tissue within the wound bed including Adherent Slough. The periwound skin appearance exhibited: Induration, Maceration, Erythema. The periwound skin appearance did not exhibit:  Dry/Scaly. The surrounding wound skin color is noted with erythema which is circumferential. Periwound temperature was noted as No Abnormality. The periwound has tenderness on palpation. Wound #3 status is Open. Original cause of wound was Pressure Injury. The wound is located on the [place]. The wound measures 1.4cm length x 1.8cm width x 0.1cm depth; 1.979cm^2 area and 0.198cm^3 volume. There is Fat Layer (Subcutaneous Tissue) Exposed exposed. There is no tunneling or undermining noted. There is a large amount of serous drainage noted. The wound margin is distinct with the outline attached to the wound base. There is large (67-100%) pink, pale granulation within the wound bed. There is a small (1-33%) amount of necrotic tissue within the wound bed including Adherent Slough. The periwound skin appearance exhibited: Maceration. The periwound skin appearance did not exhibit: Callus, Crepitus, Excoriation, Induration, Rash, Scarring, Dry/Scaly, Atrophie Blanche, Cyanosis, Ecchymosis, Hemosiderin Staining, Mottled, Pallor, Rubor, Erythema. Periwound temperature was noted as No Abnormality. The periwound has tenderness on palpation. 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 HERSON, PRICHARD ([id number]) Cleanse wound with mild soap and water Wound #3 Right,Plantar Foot: Clean wound with Normal Saline. Cleanse wound with mild soap and water Anesthetic: Wound #1 Right,Lateral Malleolus: Topical Lidocaine 4% cream applied to wound bed prior to debridement Wound #3 Right,Plantar Foot: Topical Lidocaine 4% cream applied to wound bed prior to debridement Skin Barriers/Peri-Wound  Care: Wound #1 Right,Lateral Malleolus: Antifungal cream - LOTRISONSE.Marland Kitchenapply around wound on reddened areas as needed Primary Wound Dressing: Wound #1 Right,Lateral Malleolus: Aquacel Ag Wound #3 Right,Plantar Foot: Aquacel Ag Secondary Dressing: Wound #1 Right,Lateral Malleolus: Dry Gauze Conform/Kerlix Wound #3 Right,Plantar Foot:  Dry Gauze Conform/Kerlix Drawtex Dressing Change Frequency: Wound #1 Right,Lateral Malleolus: Change dressing every day. Follow-up Appointments: Wound #1 Right,Lateral Malleolus: Return Appointment in 1 week. Edema Control: Wound #1 Right,Lateral Malleolus: Elevate legs to the level of the heart and pump ankles as often as possible Off-Loading: Wound #1 Right,Lateral Malleolus: Other: - Keep pressure off the malleolus Wound #3 Right,Plantar Foot: Other: - Keep pressure off the plantar foot Additional Orders / Instructions: Wound #1 Right,Lateral Malleolus: Increase protein intake. Activity as tolerated Laboratory ordered were: Wound culture routine - right ankle Radiology ordered were: X-ray, ankle - 2 plus view General Notes: I'm going to recommend that we go ahead and start a order for an x-ray of the right ankle. I also did perform a wound culture today just to be sure there is no infection growing and if there is to help [place] determine the best antibiotic for him. I'm not prescribing any antibiotics at this point. We will see him for LEMOND, GRIFFEE ([id number]) reevaluation in one week to see were things stand at that point. If anything worsens in the interim patient will contact our office for additional recommendations. Electronic Signature(s) Signed: 08/25/2016 6:13:19 PM By: Worthy Keeler PA-C Entered By: Worthy Keeler on 08/25/2016 16:56:34 [name] ([id number]) -------------------------------------------------------------------------------- SuperBill Details Patient Name: [name] Date of Service:  08/25/2016 Medical Record Number: [id number] Patient Account Number: [id number] Date of Birth/Sex: Dec 30, 1940 (76 y.o. Male) Treating RN: Baruch Gouty, RN, BSN, [place] Primary Care Provider: Enid Derry Other Clinician: Referring Provider: Enid Derry Treating Provider/Extender: Melburn Hake, Maeson Purohit Weeks in Treatment: 24 Diagnosis Coding ICD-10 Codes Code Description E11.621 Type 2 diabetes mellitus with foot ulcer L89.512 Pressure ulcer of right ankle, stage 2 L97.512 Non-pressure chronic ulcer of other part of right foot with fat layer exposed 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 Facility Procedures CPT4 Code: [id number] Description: 99213 - WOUND CARE VISIT-LEV 3 EST PT Modifier: Quantity: 1 Physician Procedures CPT4 Code Description: [id number] - WC PHYS LEVEL 3 - EST PT ICD-10 Description Diagnosis E11.621 Type 2 diabetes mellitus with foot ulcer L89.512 Pressure ulcer of right ankle, stage 2 L97.512 Non-pressure chronic ulcer of other part of right fo  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 Modifier: ot with fat lay Quantity: 1 er exposed Electronic Signature(s) Signed: 08/25/2016 6:13:19 PM By: Worthy Keeler PA-C Entered By: Worthy Keeler on 08/25/2016 16:56:54

## 2016-08-28 NOTE — Progress Notes (Signed)
[name] ([id number]) Visit Report for 08/25/2016 Allergy List Details Patient Name: [name], [name]. Date of Service: 08/25/2016 2:00 PM Medical Record Number: [id number] Patient Account Number: [id number] Date of Birth/Sex: 20-Dec-1940 (76 y.o. Male) Treating RN: Baruch Gouty, RN, BSN, Velva Harman Primary Care Croix Presley: Enid Derry Other Clinician: Referring Gwendolynn Merkey: [place] Derry Treating Metzli Pollick/Extender: Melburn Hake, HOYT Weeks in Treatment: 24 Allergies Active Allergies sufa drugs Cephalosporins lisinopril losartin Keflex amoxicillin ampicillin Septra Bactrim Allergy Notes Electronic Signature(s) Signed: 08/25/2016 4:24:08 PM By: Regan Lemming BSN, RN Entered By: Regan Lemming on 08/25/2016 14:54:04 [name] ([id number]) -------------------------------------------------------------------------------- [place] Details Patient Name: [name] Date of Service: 08/25/2016 2:00 PM Medical Record Number: [id number] Patient Account Number: [id number] Date of Birth/Sex: 06-03-1940 (76 y.o. Male) Treating RN: Baruch Gouty, RN, BSN, [place] Primary Care Keoshia Steinmetz: Enid Derry Other Clinician: Referring Xayvier Vallez: [place] Derry Treating Marsalis Beaulieu/Extender: Melburn Hake, HOYT Weeks in Treatment: 24 Visit Information Patient Arrived: Ambulatory Arrival Time: 14:03 Accompanied By: wife Transfer Assistance: None Patient Identification Verified: Yes Secondary Verification Process Yes Completed: Patient Requires Transmission- No Based Precautions: Patient Has Alerts: Yes Patient Alerts: Patient on Blood Thinner DM II 81mg  aspirin twice daily History Since Last Visit All ordered tests and consults were completed: No Added or deleted any medications: No Any new allergies or adverse reactions: No Had a fall or experienced change in activities of daily living that may affect risk of falls: No Signs or symptoms of abuse/neglect since last visito No Hospitalized since last  visit: No Has Dressing in Place as Prescribed: Yes Electronic Signature(s) Signed: 08/25/2016 4:24:08 PM By: Regan Lemming BSN, RN Entered By: Regan Lemming on 08/25/2016 14:03:41 [name] ([id number]) -------------------------------------------------------------------------------- Clinic Level of Care Assessment Details Patient Name: [name] Date of Service: 08/25/2016 2:00 PM Medical Record Number: [id number] Patient Account Number: [id number] Date of Birth/Sex: February 06, 1941 (76 y.o. Male) Treating RN: Baruch Gouty, RN, BSN, [place] Primary Care Lillianne Eick: En[place] Derry Other Clinician: Referring Lucilla Petrenko: [place] Derry Treating Ellesse Antenucci/Extender: Melburn Hake, HOYT Weeks in Treatment: 24 Clinic Level of Care Assessment Items TOOL 4 Quantity Score []  - Use when only an EandM is performed on FOLLOW-UP visit 0 ASSESSMENTS - Nursing Assessment / Reassessment X - Reassessment of Co-morbidities (includes updates in patient status) 1 10 X - Reassessment of Adherence to Treatment Plan 1 5 ASSESSMENTS - Wound and Skin Assessment / Reassessment []  - Simple Wound Assessment / Reassessment - one wound 0 X - Complex Wound Assessment / Reassessment - multiple wounds 2 5 []  - Dermatologic / Skin Assessment (not related to wound area) 0 ASSESSMENTS - Focused Assessment []  - Circumferential Edema Measurements - multi extremities 0 []  - Nutritional Assessment / Counseling / Intervention 0 X - Lower Extremity Assessment (monofilament, tuning fork, pulses) 1 5 []  - Peripheral Arterial Disease Assessment (using hand held doppler) 0 ASSESSMENTS - Ostomy and/or Continence Assessment and Care []  - Incontinence Assessment and Management 0 []  - Ostomy Care Assessment and Management (repouching, etc.) 0 PROCESS - Coordination of Care X - Simple Patient / Family Education for ongoing care 1 15 []  - Complex (extensive) Patient / Family Education for ongoing care 0 X - Staff obtains Programmer, systems, Records, Test  Results / Process Orders 1 10 []  - Staff telephones HHA, Nursing Homes / Clarify orders / etc 0 []  - Routine Transfer to another Facility (non-emergent condition) 0 LEITH, SZAFRANSKI ([id number]) []  - Routine Hospital Admission (non-emergent condition) 0 []  - New Admissions / Insurance Authorizations / Ordering NPWT, Apligraf, etc.  0 []  - Emergency Hospital Admission (emergent condition) 0 []  - Simple Discharge Coordination 0 []  - Complex (extensive) Discharge Coordination 0 PROCESS - Special Needs []  - Pediatric / Minor Patient Management 0 []  - Isolation Patient Management 0 []  - Hearing / Language / Visual special needs 0 []  - Assessment of Community assistance (transportation, D/C planning, etc.) 0 []  - Additional assistance / Altered mentation 0 []  - Support Surface(s) Assessment (bed, cushion, seat, etc.) 0 INTERVENTIONS - Wound Cleansing / Measurement []  - Simple Wound Cleansing - one wound 0 X - Complex Wound Cleansing - multiple wounds 2 5 X - Wound Imaging (photographs - any number of wounds) 1 5 []  - Wound Tracing (instead of photographs) 0 []  - Simple Wound Measurement - one wound 0 X - Complex Wound Measurement - multiple wounds 2 5 INTERVENTIONS - Wound Dressings X - Small Wound Dressing one or multiple wounds 2 10 []  - Medium Wound Dressing one or multiple wounds 0 []  - Large Wound Dressing one or multiple wounds 0 []  - Application of Medications - topical 0 []  - Application of Medications - injection 0 INTERVENTIONS - Miscellaneous []  - External ear exam 0 CAYLE, THUNDER ([id number]) X - Specimen Collection (cultures, biopsies, blood, body fluids, etc.) 1 5 X - Specimen(s) / Culture(s) sent or taken to Lab for analysis 1 5 []  - Patient Transfer (multiple staff / Harrel Lemon Lift / Similar devices) 0 []  - Simple Staple / Suture removal (25 or less) 0 []  - Complex Staple / Suture removal (26 or more) 0 []  - Hypo / Hyperglycemic Management (close monitor of Blood Glucose)  0 []  - Ankle / Brachial Index (ABI) - do not check if billed separately 0 X - Vital Signs 1 5 Has the patient been seen at the hospital within the last three years: Yes Total Score: 115 Level Of Care: New/Established - Level 3 Electronic Signature(s) Signed: 08/25/2016 4:24:08 PM By: Regan Lemming BSN, RN Entered By: Regan Lemming on 08/25/2016 14:42:40 [name] ([id number]) -------------------------------------------------------------------------------- Encounter Discharge Information Details Patient Name: [name] Date of Service: 08/25/2016 2:00 PM Medical Record Number: [id number] Patient Account Number: [id number] Date of Birth/Sex: 11/26/1940 (76 y.o. Male) Treating RN: Baruch Gouty, RN, BSN, Velva Harman Primary Care Lucynda Rosano: En[place] Derry Other Clinician: Referring Lidia Clavijo: [place] Derry Treating Keyron Pokorski/Extender: Melburn Hake, HOYT Weeks in Treatment: 24 Encounter Discharge Information Items Discharge Pain Level: 0 Discharge Condition: Stable Ambulatory Status: Ambulatory Discharge Destination: Home Transportation: Private Auto Accompanied By: wife Schedule Follow-up Appointment: No Medication Reconciliation completed and provided to Patient/Care No Armonte Tortorella: Provided on Clinical Summary of Care: 08/25/2016 Form Type Recipient Paper Patient FW Electronic Signature(s) Signed: 08/25/2016 4:24:08 PM By: Regan Lemming BSN, RN Previous Signature: 08/25/2016 2:57:05 PM Version By: Ruthine Dose Entered By: Regan Lemming on 08/25/2016 14:57:47 [name] ([id number]) -------------------------------------------------------------------------------- Lower Extremity Assessment Details Patient Name: [name] Date of Service: 08/25/2016 2:00 PM Medical Record Number: [id number] Patient Account Number: [id number] Date of Birth/Sex: 1940/10/08 (75 y.o. Male) Treating RN: Baruch Gouty, RN, BSN, Velva Harman Primary Care Derren Suydam: Enid Derry Other Clinician: Referring Bela Nyborg: En[place] Derry Treating Lazaro Isenhower/Extender: Melburn Hake, HOYT Weeks in Treatment: 24 Vascular Assessment Claudication: Claudication Assessment [Right:None] Pulses: Dorsalis Pedis Palpable: [Right:Yes] Posterior Tibial Extremity colors, hair growth, and conditions: Extremity Color: [Right:Normal] Hair Growth on Extremity: [Right:No] Temperature of Extremity: [Right:Warm] Capillary Refill: [Right:< 3 seconds] Electronic Signature(s) Signed: 08/25/2016 4:24:08 PM By: Regan Lemming BSN, RN Entered By: Regan Lemming on 08/25/2016 14:05:34 [name]. (  [id number]) -------------------------------------------------------------------------------- Multi Wound Chart Details Patient Name: RASHEED, WELTY. Date of Service: 08/25/2016 2:00 PM Medical Record Number: [id number] Patient Account Number: [id number] Date of Birth/Sex: [birth year] (76 y.o. Male) Treating RN: Baruch Gouty, RN, BSN, Velva Harman Primary Care Broady Lafoy: Enid Derry Other Clinician: Referring Clark Cuff: [place] Derry Treating Mattisyn Cardona/Extender: Melburn Hake, HOYT Weeks in Treatment: 24 Vital Signs Height(in): 67 Pulse(bpm): 81 Weight(lbs): 136 Blood Pressure 119/46 (mmHg): Body Mass Index(BMI): 21 Temperature(F): 98 Respiratory Rate 16 (breaths/min): Photos: [1:No Photos] [3:No Photos] [N/A:N/A] Wound Location: [1:Right Malleolus - Lateral] [3:Right Foot - Plantar] [N/A:N/A] Wounding Event: [1:Gradually Appeared] [3:Pressure Injury] [N/A:N/A] Primary Etiology: [1: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] [N/A:N/A] Secondary Etiology: [1:Diabetic Wound/Ulcer of the Lower Extremity] [3:N/A] [N/A:N/A] Comorbid History: [1:Cataracts, Chronic sinus problems/congestion, Anemia, Sleep Apnea, Hypertension, Myocardial Infarction, Type II Diabetes, Received Chemotherapy, Received Radiation] [3:Cataracts, Chronic sinus problems/congestion, Anemia, Sleep  Apnea, Hypertension, Myocardial Infarction, Type II Diabetes, Received  Chemotherapy, Received Radiation] [N/A:N/A] Date Acquired: [1:01/09/2016] [3:06/15/2016] [N/A:N/A] Weeks of Treatment: [1:24] [3:10] [N/A:N/A] Wound Status: [1:Open] [3:Open] [N/A:N/A] Measurements L x W x D 0.8x1.5x0.2 [3:1.4x1.8x0.1] [N/A:N/A] (cm) Area (cm) : [1:0.942] [3:1.979] [N/A:N/A] Volume (cm) : [1:0.188] [3:0.198] [N/A:N/A] % Reduction in Area: [1:-299.20%] [3:-12.00%] [N/A:N/A] % Reduction in Volume: -683.30% [3:92.50%] [N/A:N/A] Starting Position 1 12 (o'clock): Ending Position 1 [1:12] (o'clock): Maximum Distance 1 1 (cm): ROTH, RESS ([id number]) Undermining: Yes No N/A Classification: Category/Stage III Grade 1 N/A HBO Classification: Grade 1 N/A N/A Exudate Amount: Large Large N/A Exudate Type: Serous Serous N/A Exudate Color: amber amber N/A Wound Margin: Flat and Intact Distinct, outline attached N/A Granulation Amount: Small (1-33%) Large (67-100%) N/A Granulation Quality: Pink, Pale Pink, Pale N/A Necrotic Amount: Large (67-100%) Small (1-33%) N/A Exposed Structures: Fat Layer (Subcutaneous Fat Layer (Subcutaneous N/A Tissue) Exposed: Yes Tissue) Exposed: Yes Fascia: No Fascia: No Tendon: No Tendon: No Muscle: No Muscle: No Joint: No Joint: No Bone: No Bone: No Epithelialization: None None N/A Periwound Skin Texture: Induration: Yes Excoriation: No N/A Induration: No Callus: No Crepitus: No Rash: No Scarring: No Periwound Skin Maceration: Yes Maceration: Yes N/A Moisture: Dry/Scaly: No Dry/Scaly: No Periwound Skin Color: Erythema: Yes Atrophie Blanche: No N/A Cyanosis: No Ecchymosis: No Erythema: No Hemosiderin Staining: No Mottled: No Pallor: No Rubor: No Erythema Location: Circumferential N/A N/A Erythema Change: Decreased N/A N/A Temperature: No Abnormality No Abnormality N/A Tenderness on Yes Yes N/A Palpation: Wound Preparation: Ulcer Cleansing: Ulcer Cleansing: N/A Rinsed/Irrigated with Rinsed/Irrigated  with Saline Saline Topical Anesthetic Topical Anesthetic Applied: Other: lidocaine Applied: Other: lidocaine 4% 4% Treatment Notes COLONEL, KRAUSER ([id number]) Electronic Signature(s) Signed: 08/25/2016 4:24:08 PM By: Regan Lemming BSN, RN Entered By: Regan Lemming on 08/25/2016 14:30:19 [name] ([id number]) -------------------------------------------------------------------------------- [place] Details Patient Name: BRYLER, DIBBLE Date of Service: 08/25/2016 2:00 PM Medical Record Number: [id number] Patient Account Number: [id number] Date of Birth/Sex: 29-Jul-1940 (76 y.o. Male) Treating RN: Baruch Gouty, RN, BSN, Velva Harman Primary Care Kawika Bischoff: Enid Derry Other Clinician: Referring Isyss Espinal: En[place] Derry Treating Everette Mall/Extender: Melburn Hake, HOYT Weeks in Treatment: 24 Active Inactive ` Orientation to the Wound Care Program Nursing Diagnoses: Knowledge deficit related to the wound healing center program Goals: Patient/caregiver will verbalize understanding of the [place] Program Date Initiated: 03/10/2016 Target Resolution Date: 03/23/2016 Goal Status: Active Interventions: Provide education on orientation to the wound center Notes: ` Soft Tissue Infection Nursing Diagnoses: Impaired tissue integrity Potential for infection: soft tissue Goals: Patient will remain free of wound infection Date Initiated: 03/10/2016 Target Resolution  Date: 03/17/2016 Goal Status: Active Interventions: Assess signs and symptoms of infection every visit Notes: ` Wound/Skin Impairment Nursing Diagnoses: Impaired tissue integrity CONNELL, BOGNAR ([id number]) Goals: Ulcer/skin breakdown will heal within 14 weeks Date Initiated: 03/10/2016 Target Resolution Date: 03/24/2016 Goal Status: Active Interventions: Assess patient/caregiver ability to obtain necessary supplies Notes: Electronic Signature(s) Signed: 08/25/2016 4:24:08 PM By: Regan Lemming BSN,  RN Entered By: Regan Lemming on 08/25/2016 14:15:26 [name] ([id number]) -------------------------------------------------------------------------------- Pain Assessment Details Patient Name: [name] Date of Service: 08/25/2016 2:00 PM Medical Record Number: [id number] Patient Account Number: [id number] Date of Birth/Sex: May 12, 1940 (76 y.o. Male) Treating RN: Baruch Gouty, RN, BSN, Velva Harman Primary Care Tyler Cubit: Enid Derry Other Clinician: Referring Wynell Halberg: [place] Derry Treating Marice Guidone/Extender: Melburn Hake, HOYT Weeks in Treatment: 24 Active Problems Location of Pain Severity and Description of Pain Patient Has Paino No Site Locations With Dressing Change: No Pain Management and Medication Current Pain Management: Electronic Signature(s) Signed: 08/25/2016 4:24:08 PM By: Regan Lemming BSN, RN Entered By: Regan Lemming on 08/25/2016 14:03:48 [name] ([id number]) -------------------------------------------------------------------------------- Patient/Caregiver Education Details Patient Name: [name] Date of Service: 08/25/2016 2:00 PM Medical Record Number: [id number] Patient Account Number: [id number] Date of Birth/Gender: 1940/08/09 (76 y.o. Male) Treating RN: Baruch Gouty, RN, BSN, Velva Harman Primary Care Physician: Enid Derry Other Clinician: Referring Physician: Enid Derry Treating Physician/Extender: Sharalyn Ink in Treatment: 24 Education Assessment Education Provided To: Patient and Caregiver Education Topics Provided Welcome To [facility]: Methods: Explain/Verbal Responses: State content correctly Wound/Skin Impairment: Methods: Explain/Verbal Responses: State content correctly Electronic Signature(s) Signed: 08/25/2016 4:24:08 PM By: Regan Lemming BSN, RN Entered By: Regan Lemming on 08/25/2016 14:58:04 [name] ([id number]) -------------------------------------------------------------------------------- Wound Assessment  Details Patient Name: [name] Date of Service: 08/25/2016 2:00 PM Medical Record Number: [id number] Patient Account Number: [id number] Date of Birth/Sex: 04-22-1940 (76 y.o. Male) Treating RN: Baruch Gouty, RN, BSN, [place] Primary Care Mikie Misner: En[place] Derry Other Clinician: Referring Alois Mincer: [place] Derry Treating Teran Daughenbaugh/Extender: Melburn Hake, HOYT Weeks in Treatment: 24 Wound Status Wound Number: 1 Primary Pressure Ulcer Etiology: Wound Location: Right Malleolus - Lateral Secondary Diabetic Wound/Ulcer of the Lower Wounding Event: Gradually Appeared Etiology: Extremity Date Acquired: 01/09/2016 Wound Open Weeks Of Treatment: 24 Status: Clustered Wound: No Comorbid Cataracts, Chronic sinus History: problems/congestion, Anemia, Sleep Apnea, Hypertension, Myocardial Infarction, Type II Diabetes, Received Chemotherapy, Received Radiation Photos Photo Uploaded By: Regan Lemming on 08/25/2016 16:28:06 Wound Measurements Length: (cm) 0.8 Width: (cm) 1.5 Depth: (cm) 0.2 Area: (cm) 0.942 Volume: (cm) 0.188 % Reduction in Area: -299.2% % Reduction in Volume: -683.3% Epithelialization: None Tunneling: No Undermining: Yes Starting Position (o'clock): 12 Ending Position (o'clock): 12 Maximum Distance: (cm) 1 Wound Description 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 Severity Earleen Newport): Grade 1 Slough/Fibrin Wound Margin: Flat and Intact ZACCARY, CREECH ([id number]) er Cleansing: No o Yes Exudate Amount: Large Exudate Type: Serous Exudate Color: amber Wound Bed Granulation Amount: Small (1-33%) Exposed Structure Granulation Quality: Pink, Pale Fascia Exposed: No Necrotic Amount: Large (67-100%) Fat Layer (Subcutaneous Tissue) Exposed: Yes Necrotic Quality: Adherent Slough Tendon Exposed: No Muscle Exposed: No Joint Exposed: No Bone Exposed: No Periwound Skin Texture Texture Color No Abnormalities Noted: No No Abnormalities Noted:  No Induration: Yes Erythema: Yes Erythema Location: Circumferential Moisture Erythema Change: Decreased No Abnormalities Noted: No Dry / Scaly: No Temperature / Pain Maceration: Yes Temperature: No Abnormality Tenderness on Palpation: Yes Wound Preparation Ulcer Cleansing: Rinsed/Irrigated with Saline Topical Anesthetic Applied: Other: lidocaine 4%, Treatment Notes Wound #  1 (Right, Lateral Malleolus) 1. Cleansed with: Clean wound with Normal Saline 4. Dressing Applied: Aquacel Ag 5. Secondary Dressing Applied Gauze and Kerlix/Conform 7. Secured with Recruitment consultant) Signed: 08/25/2016 4:24:08 PM By: Regan Lemming BSN, RN Entered By: Regan Lemming on 08/25/2016 14:14:26 [name] ([id number]) -------------------------------------------------------------------------------- Wound Assessment Details Patient Name: [name] Date of Service: 08/25/2016 2:00 PM Medical Record Number: [id number] Patient Account Number: [id number] Date of Birth/Sex: [birth year] (76 y.o. Male) Treating RN: Baruch Gouty, RN, BSN, [place] Primary Care Ryleigh Esqueda: Enid Derry Other Clinician: Referring Magaline Steinberg: [place] D[place] Treating Rai Sinagra/Extender: Melburn Hake, HOYT Weeks in Treatment: 24 Wound Status Wound Number: 3 Primary Diabetic Wound/Ulcer of the Lower Etiology: Extremity Wound Location: Right Foot - Plantar Wound Open Wounding Event: Pressure Injury Status: Date Acquired: 06/15/2016 Comorbid Cataracts, Chronic sinus Weeks Of Treatment: 10 History: problems/congestion, Anemia, Sleep Clustered Wound: No Apnea, Hypertension, Myocardial Infarction, Type II Diabetes, Received Chemotherapy, Received Radiation Photos Photo Uploaded By: Regan Lemming on 08/25/2016 16:28:06 Wound Measurements Length: (cm) 1.4 Width: (cm) 1.8 Depth: (cm) 0.1 Area: (cm) 1.979 Volume: (cm) 0.198 % Reduction in Area: -12% % Reduction in Volume: 92.5% Epithelialization: None Tunneling:  No Undermining: No Wound Description Classification: Grade 1 Foul Odor Aft Wound Margin: Distinct, outline attached Slough/Fibrin Exudate Amount: Large Exudate Type: Serous Exudate Color: amber er Cleansing: No o Yes Wound Bed Granulation Amount: Large (67-100%) Exposed Structure Granulation Quality: Pink, Pale Fascia Exposed: No HANIEL, FIX ([id number]) Necrotic Amount: Small (1-33%) Fat Layer (Subcutaneous Tissue) Exposed: Yes Necrotic Quality: Adherent Slough Tendon Exposed: No Muscle Exposed: No Joint Exposed: No Bone Exposed: No Periwound Skin Texture Texture Color No Abnormalities Noted: No No Abnormalities Noted: No Callus: No Atrophie Blanche: No Crepitus: No Cyanosis: No Excoriation: No Ecchymosis: No Induration: No Erythema: No Rash: No Hemosiderin Staining: No Scarring: No Mottled: No Pallor: No Moisture Rubor: No No Abnormalities Noted: No Dry / Scaly: No Temperature / Pain Maceration: Yes Temperature: No Abnormality Tenderness on Palpation: Yes Wound Preparation Ulcer Cleansing: Rinsed/Irrigated with Saline Topical Anesthetic Applied: Other: lidocaine 4%, Treatment Notes Wound #3 (Right, Plantar Foot) 1. Cleansed with: Clean wound with Normal Saline 4. Dressing Applied: Aquacel Ag 5. Secondary Dressing Applied Gauze and Kerlix/Conform 7. Secured with Recruitment consultant) Signed: 08/25/2016 4:24:08 PM By: Regan Lemming BSN, RN Entered By: Regan Lemming on 08/25/2016 14:15:14 [name] ([id number]) -------------------------------------------------------------------------------- [place] Details Patient Name: [name] Date of Service: 08/25/2016 2:00 PM Medical Record Number: [id number] Patient Account Number: [id number] Date of Birth/Sex: 27-Oct-1940 (76 y.o. Male) Treating RN: Baruch Gouty, RN, BSN, [place] Primary Care Kiarah Eckstein: En[place] Derry Other Clinician: Referring Tatum Corl: [place] Derry Treating Myrakle Wingler/Extender: Melburn Hake, HOYT Weeks in Treatment: 24 Vital Signs Time Taken: 14:03 Temperature (F): 98 Height (in): 67 Pulse (bpm): 81 Weight (lbs): 136 Respiratory Rate (breaths/min): 16 Body Mass Index (BMI): 21.3 Blood Pressure (mmHg): 119/46 Reference Range: 80 - 120 mg / dl Electronic Signature(s) Signed: 08/25/2016 4:24:08 PM By: Regan Lemming BSN, RN Entered By: Regan Lemming on 08/25/2016 14:05:48

## 2016-08-31 DIAGNOSIS — C155 Malignant neoplasm of lower third of esophagus: Secondary | ICD-10-CM | POA: Diagnosis not present

## 2016-08-31 DIAGNOSIS — C787 Secondary malignant neoplasm of liver and intrahepatic bile duct: Secondary | ICD-10-CM | POA: Diagnosis not present

## 2016-08-31 DIAGNOSIS — F0390 Unspecified dementia without behavioral disturbance: Secondary | ICD-10-CM | POA: Diagnosis not present

## 2016-08-31 DIAGNOSIS — R1310 Dysphagia, unspecified: Secondary | ICD-10-CM | POA: Diagnosis not present

## 2016-08-31 DIAGNOSIS — R634 Abnormal weight loss: Secondary | ICD-10-CM | POA: Diagnosis not present

## 2016-08-31 DIAGNOSIS — D509 Iron deficiency anemia, unspecified: Secondary | ICD-10-CM | POA: Diagnosis not present

## 2016-09-01 ENCOUNTER — Encounter: Admitting: Surgery

## 2016-09-01 DIAGNOSIS — A4901 Methicillin susceptible Staphylococcus aureus infection, unspecified site: Secondary | ICD-10-CM | POA: Diagnosis not present

## 2016-09-01 DIAGNOSIS — S91301A Unspecified open wound, right foot, initial encounter: Secondary | ICD-10-CM | POA: Diagnosis not present

## 2016-09-01 DIAGNOSIS — E11621 Type 2 diabetes mellitus with foot ulcer: Secondary | ICD-10-CM | POA: Diagnosis not present

## 2016-09-01 DIAGNOSIS — S91001A Unspecified open wound, right ankle, initial encounter: Secondary | ICD-10-CM | POA: Diagnosis not present

## 2016-09-04 NOTE — Progress Notes (Addendum)
FOWLER, ANTOS ([id number]) Visit Report for 09/01/2016 Chief Complaint Document Details Patient Name: [name], [name]. Date of Service: 09/01/2016 11:15 AM Medical Record Number: [id number] Patient Account Number: [id number] Date of Birth/Sex: 1940/11/01 (76 y.o. Male) Treating RN: Baruch Gouty, RN, BSN, Velva Harman Primary Care Provider: Enid Derry Other Clinician: Referring Provider: Enid Derry Treating Provider/Extender: Frann Rider in Treatment: 25 Information Obtained from: Patient Chief Complaint Patient is at the clinic for treatment of an open pressure ulcer to the right lateral ankle Electronic Signature(s) Signed: 09/01/2016 12:03:05 PM By: Christin Fudge MD, FACS Entered By: Christin Fudge on 09/01/2016 12:03:05 [name] ([id number]) -------------------------------------------------------------------------------- HPI Details Patient Name: [name] Date of Service: 09/01/2016 11:15 AM Medical Record Number: [id number] Patient Account Number: [id number] Date of Birth/Sex: 1940/04/23 (76 y.o. Male) Treating RN: Baruch Gouty, RN, BSN, Velva Harman Primary Care Provider: Enid Derry Other Clinician: Referring Provider: Enid Derry Treating Provider/Extender: Frann Rider in Treatment: 25 History of Present Illness Location: Patient presents with an ulcer on the right lateral ankle. Quality: Patient reports experiencing a dull pain to affected area(s). Severity: Patient states wound are getting worse. Duration: Patient has had the wound for > 2 months prior to seeking treatment at the wound center Timing: Pain in wound is Intermittent (comes and goes Context: The wound appeared gradually over time Modifying Factors: Other treatment(s) tried include:local care as per the medical oncologist Associated Signs and Symptoms: Patient reports having:some pain and no discharge HPI Description: 76 year old patient has been referred by his medical oncologist Dr. Grayland Ormond, for a  right lateral malleolus ulcer has been there for several weeks. 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 He is currently on chemotherapy with FOLFOX and Herceptin. past medical history significant for diabetes mellitus, anemia, hypertension, sleep apnea, status post appendectomy, peripheral vascular catheterization( Port placement) in September 2017 by Dr. Delana Meyer. He is also receiving palliative radiation therapy and was seen by Dr. Donella Stade. 03/17/2016 -- - x-ray of the right ankle -- IMPRESSION: 1. Soft tissue wound noted over the lateral malleolus, no underlying bony abnormality. No acute bony abnormality. 2. Diffuse degenerative change. 3. Peripheral vascular disease. 05/05/2016 -- the patient's wife tells me that she has noticed a small superficial bruise on the left lateral ankle and wanted me to view this area. 05/12/2016 -- the patient is unable to afford Santyl ointment and we have been struggling over the last 8 weeks to use various alternatives. I have asked them today if they would be willing to use the snap vacuum system and she will let me know soon. 05/19/2016 -- the patient and the wife have declined the use of the snap the vacuum system. He does not want any debridement to be done today. His proteins have improved a bit. 06/08/2016 -- . Not seen the patient back for 3 weeks and in the meanwhile the wife has decided to use duoderm on the wound,-- given to her by a friend. The wound is macerated and has increased in size due to the moisture. 06/16/2016 -- besides the original wound he had on his right lateral malleolus he has a small area on the dorsum of his right foot which is possibly cause due to pressure. He now has a plantar wound on the first metatarsal head on the right foot where his podiatrist Dr. Vickki Muff had debrided a callus yesterday. 08/04/16 on evaluation today patient wounds of the right lower extremity  appear to be doing  better. He is tolerating the dressing's although there is some maceration on the lateral foot wound. Overall there's no evidence of infection. 08/11/2016 -- he has had his first application of Grafix to the right lateral ankle. 08/14/2016 -- the patient was brought in by his wife today as he was complaining of pain over his right ankle where the graphics was applied a few days ago. Note is made of the fact that he had had a fall with a slight TAUHEED, MCFAYDEN. ([id number]) blunt injury to the ankle. 08/18/16 on evaluation patient has a small new opening over the right lateral ankle compared to last week's evaluation. There's also increased erythema which appears to be consistent with an infection in my opinion. He is also having increased pain. He apparently did have a fall and some of the increased pain could be from the fall although this may also be due to a wound infection. Unfortunately the fall is complicating the situation as far as knowing exactly where the pain is coming from. 08/25/16 on evaluation today patient lateral malleolus wound appears to be doing a little bit better there's not much erythema surrounding the wound at this point. He is still having pain in ankle region. 09/01/2016 -- x-ray of the right ankle -- IMPRESSION:Cannot exclude a subtle nondisplaced fracture of the inferior aspect of the lateral malleolus. RIGHT lateral ulcer without definite findings of cortical destruction. oCulture taken of the wound grew an MSSA which is sensitive to doxycycline and ciprofloxacin the patient does not look as bright as always and seems a little subdued. He says he is feeling fine Electronic Signature(s) Signed: 09/01/2016 12:03:33 PM By: Christin Fudge MD, FACS Previous Signature: 09/01/2016 11:47:05 AM Version By: Christin Fudge MD, FACS Previous Signature: 09/01/2016 11:46:38 AM Version By: Christin Fudge MD, FACS Entered By: Christin Fudge on 09/01/2016 12:03:32 [name] ([id number]) -------------------------------------------------------------------------------- Physical Exam Details Patient Name: BERDELL, HOSTETLER Date of Service: 09/01/2016 11:15 AM Medical Record Number: [id number] Patient Account Number: [id number] Date of Birth/Sex: 29-Jun-1940 (76 y.o. Male) Treating RN: Baruch Gouty, RN, BSN, Velva Harman Primary Care Provider: Enid Derry Other Clinician: Referring Provider: Enid Derry Treating Provider/Extender: Frann Rider in Treatment: 25 Constitutional . Pulse regular. Respirations normal and unlabored. Afebrile. . Eyes Nonicteric. Reactive to light. Ears, Nose, Mouth, and Throat Lips, teeth, and gums WNL.Marland Kitchen Moist mucosa without lesions. Neck supple and nontender. No palpable supraclavicular or cervical adenopathy. Normal sized without goiter. Respiratory WNL. No retractions.. Cardiovascular Pedal Pulses WNL. No clubbing, cyanosis or edema. Lymphatic No adneopathy. No adenopathy. No adenopathy. Musculoskeletal Adexa without tenderness or enlargement.. Digits and nails w/o clubbing, cyanosis, infection, petechiae, ischemia, or inflammatory conditions.. Integumentary (Hair, Skin) No suspicious lesions. No crepitus or fluctuance. No peri-wound warmth or erythema. No masses.Marland Kitchen Psychiatric Judgement and insight Intact.. No evidence of depression, anxiety, or agitation.. Notes the right ankle wound has some slough at the base and there is some excoriation around the edges but there is no evidence of cellulitis or purulence. The plantar ulcer is looking fairly clean and no sharp debridement was required. Electronic Signature(s) Signed: 09/01/2016 12:04:13 PM By: Christin Fudge MD, FACS Entered By: Christin Fudge on 09/01/2016 12:04:13 [name] ([id number]) -------------------------------------------------------------------------------- Physician Orders Details Patient Name: MAURIZIO, GENO. Date of Service: 09/01/2016 11:15  AM Medical Record Number: [id number] Patient Account Number: [id number] Date of Birth/Sex: [birth year] (76 y.o. Male) Treating RN: Baruch Gouty, RN, BSN, Velva Harman Primary Care Provider: Enid Derry Other Clinician: Referring Provider: Enid Derry Treating Provider/Extender: Con Memos,  Nylani Michetti Weeks in Treatment: 25 Verbal / Phone Orders: No Diagnosis Coding Wound Cleansing Wound #1 Right,Lateral Malleolus o Clean wound with Normal Saline. o Cleanse wound with mild soap and water Wound #3 Right,Plantar Foot o Clean wound with Normal Saline. o Cleanse wound with mild soap and water Anesthetic Wound #1 Right,Lateral Malleolus o Topical Lidocaine 4% cream applied to wound bed prior to debridement Wound #3 Right,Plantar Foot o Topical Lidocaine 4% cream applied to wound bed prior to debridement Primary Wound Dressing Wound #1 Right,Lateral Malleolus o Santyl Ointment - IN CLINIC o Medihoney gel - AT HOME Wound #3 Right,Plantar Foot o Aquacel Ag Secondary Dressing Wound #1 Right,Lateral Malleolus o Dry Gauze o Conform/Kerlix Wound #3 Right,Plantar Foot o Dry Gauze o Conform/Kerlix o Drawtex Dressing Change Frequency Wound #1 Right,Lateral Malleolus CORNELIUS, MARULLO ([id number]) o Change dressing every day. Follow-up Appointments Wound #1 Right,Lateral Malleolus o Return Appointment in 1 week. Edema Control Wound #1 Right,Lateral Malleolus o Elevate legs to the level of the heart and pump ankles as often as possible Off-Loading Wound #1 Right,Lateral Malleolus o Other: - Keep pressure off the malleolus Wound #3 Right,Plantar Foot o Other: - Keep pressure off the plantar foot Additional Orders / Instructions Wound #1 Right,Lateral Malleolus o Increase protein intake. o Activity as tolerated Electronic Signature(s) Signed: 09/01/2016 3:47:55 PM By: Regan Lemming BSN, RN Signed: 09/01/2016 4:13:09 PM By: Christin Fudge MD, FACS Entered By: Regan Lemming on 09/01/2016 12:06:38 [name] ([id number]) -------------------------------------------------------------------------------- Problem List Details Patient Name: SALLY, REIMERS Date of Service: 09/01/2016 11:15 AM Medical Record Number: [id number] Patient Account Number: [id number] Date of Birth/Sex: April 30, 1940 (76 y.o. 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 05/05/2016 Yes Inactive Problems Resolved Problems ICD-10 Code Description Active Date Resolved Date L89.521 Pressure ulcer of left ankle, stage 1 05/05/2016 05/05/2016 Electronic Signature(s) Signed: 09/01/2016 12:02:53 PM By: Christin Fudge MD, FACS Entered By: Christin Fudge on 09/01/2016 12:02:52 [name] ([id number]) -------------------------------------------------------------------------------- Progress Note Details Patient Name: [name] Date of Service: 09/01/2016 11:15 AM Medical Record Number: [id number] Patient Account Number: [id number] Date of Birth/Sex: 12-21-1940 (76 y.o. Male) Treating RN: Baruch Gouty, RN, BSN, Velva Harman Primary Care Provider: Enid Derry Other Clinician: Referring Provider: Enid Derry Treating Provider/Extender: Frann Rider in Treatment: 25 Subjective Chief Complaint Information obtained from Patient Patient is at the clinic for treatment of an open pressure ulcer to the right lateral ankle History of Present Illness (HPI) The following HPI elements were documented for the  patient's wound: Location: Patient presents with an ulcer on the right lateral ankle. Quality: Patient reports experiencing a dull pain to affected area(s). Severity: Patient states wound are getting worse. Duration: Patient has had the wound for > 2 months prior to seeking treatment at the wound center Timing: Pain in wound is Intermittent (comes and goes Context: The wound appeared gradually over time Modifying Factors: Other treatment(s) tried include:local care as per the medical oncologist Associated Signs and Symptoms: Patient reports having:some pain and no discharge 76 year old patient has been referred by his medical oncologist Dr. Grayland Ormond, for a right lateral malleolus  ulcer has been there for several weeks. 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 He is currently on chemotherapy with FOLFOX and Herceptin. past medical history significant for diabetes mellitus, anemia, hypertension, sleep apnea, status post appendectomy, peripheral vascular catheterization( Port placement) in September 2017 by Dr. Delana Meyer. He is also receiving palliative radiation therapy and was seen by Dr. Donella Stade. 03/17/2016 -- - x-ray of the right ankle -- IMPRESSION: 1. Soft tissue wound noted over the lateral malleolus, no underlying bony abnormality. No acute bony abnormality. 2. Diffuse degenerative change. 3. Peripheral vascular disease. 05/05/2016 -- the patient's wife tells me that she has noticed a small superficial bruise on the left lateral ankle and wanted me to view this area. 05/12/2016 -- the patient is unable to afford Santyl ointment and we have been struggling over the last 8 weeks to use various alternatives. I have asked them today if they would be willing to use the snap vacuum system and she will let me know soon. 05/19/2016 -- the patient and the wife have declined the use of the snap the vacuum system. He does not want any  debridement to be done today. His proteins have improved a bit. 06/08/2016 -- . Not seen the patient back for 3 weeks and in the meanwhile the wife has decided to use duoderm on the wound,-- given to her by a friend. The wound is macerated and has increased in size due to the moisture. 06/16/2016 -- besides the original wound he had on his right lateral malleolus he has a small area on the JAVARES, KAUFHOLD. ([id number]) dorsum of his right foot which is possibly cause due to pressure. He now has a plantar wound on the first metatarsal head on the right foot where his podiatrist Dr. Vickki Muff had debrided a callus yesterday. 08/04/16 on evaluation today patient wounds of the right lower extremity appear to be doing better. He is tolerating the dressing's although there is some maceration on the lateral foot wound. Overall there's no evidence of infection. 08/11/2016 -- he has had his first application of Grafix to the right lateral ankle. 08/14/2016 -- the patient was brought in by his wife today as he was complaining of pain over his right ankle where the graphics was applied a few days ago. Note is made of the fact that he had had a fall with a slight blunt injury to the ankle. 08/18/16 on evaluation patient has a small new opening over the right lateral ankle compared to last week's evaluation. There's also increased erythema which appears to be consistent with an infection in my opinion. He is also having increased pain. He apparently did have a fall and some of the increased pain could be from the fall although this may also be due to a wound infection. Unfortunately the fall is complicating the situation as far as knowing exactly where the pain is coming from. 08/25/16 on evaluation today patient lateral malleolus wound appears to be doing a little bit better there's not much erythema surrounding the wound at this point. He is still having pain in ankle region. 09/01/2016 -- x-ray of the right  ankle -- IMPRESSION:Cannot exclude a subtle nondisplaced fracture of the inferior aspect of the lateral malleolus. RIGHT lateral ulcer without definite findings of cortical destruction. Culture taken of the wound grew an MSSA which is sensitive to doxycycline and ciprofloxacin the patient does not look as bright as always and seems a little  subdued. He says he is feeling fine Allergies sufa drugs, Cephalosporins, lisinopril, losartin, Keflex, amoxicillin, ampicillin, Septra, Bactrim Objective Constitutional Pulse regular. Respirations normal and unlabored. Afebrile. Vitals Time Taken: 11:20 AM, Height: 67 in, Weight: 136 lbs, BMI: 21.3, Temperature: 97.8 F, Pulse: 74 bpm, Respiratory Rate: 16 breaths/min, Blood Pressure: 109/41 mmHg. Eyes Nonicteric. Reactive to light. Ears, Nose, Mouth, and Throat Lips, teeth, and gums WNL.Marland Kitchen Moist mucosa without lesions. ZAMIER, EGGEBRECHT ([id number]) Neck supple and nontender. No palpable supraclavicular or cervical adenopathy. Normal sized without goiter. Respiratory WNL. No retractions.. Cardiovascular Pedal Pulses WNL. No clubbing, cyanosis or edema. Lymphatic No adneopathy. No adenopathy. No adenopathy. Musculoskeletal Adexa without tenderness or enlargement.. Digits and nails w/o clubbing, cyanosis, infection, petechiae, ischemia, or inflammatory conditions.Marland Kitchen Psychiatric Judgement and insight Intact.. No evidence of depression, anxiety, or agitation.. General Notes: the right ankle wound has some slough at the base and there is some excoriation around the edges but there is no evidence of cellulitis or purulence. The plantar ulcer is looking fairly clean and no sharp debridement was required. Integumentary (Hair, Skin) No suspicious lesions. No crepitus or fluctuance. No peri-wound warmth or erythema. No masses.. Wound #1 status is Open. Original cause of wound was Gradually Appeared. The wound is located on the Right,Lateral Malleolus. The  wound measures 1.3cm length x 1.4cm width x 0.2cm depth; 1.429cm^2 area and 0.286cm^3 volume. There is Fat Layer (Subcutaneous Tissue) Exposed exposed. There is no tunneling or undermining noted. There is a large amount of serous drainage noted. The wound margin is flat and intact. There is small (1-33%) pink, pale granulation within the wound bed. There is a large (67-100%) amount of necrotic tissue within the wound bed including Adherent Slough. The periwound skin appearance exhibited: Induration, Maceration, Erythema. The periwound skin appearance did not exhibit: Dry/Scaly. The surrounding wound skin color is noted with erythema which is circumferential. Periwound temperature was noted as No Abnormality. The periwound has tenderness on palpation. Wound #3 status is Open. Original cause of wound was Pressure Injury. The wound is located on the [place]. The wound measures 1cm length x 1.3cm width x 0.1cm depth; 1.021cm^2 area and 0.102cm^3 volume. There is Fat Layer (Subcutaneous Tissue) Exposed exposed. There is no tunneling or undermining noted. There is a large amount of serous drainage noted. The wound margin is distinct with the outline attached to the wound base. There is large (67-100%) pink, pale granulation within the wound bed. There is a small (1-33%) amount of necrotic tissue within the wound bed including Adherent Slough. The periwound skin appearance exhibited: Maceration. The periwound skin appearance did not exhibit: Callus, Crepitus, Excoriation, Induration, Rash, Scarring, Dry/Scaly, Atrophie Blanche, Cyanosis, Ecchymosis, Hemosiderin Staining, Mottled, Pallor, Rubor, Erythema. Periwound temperature was noted as No Abnormality. The periwound has tenderness on palpation. MAKIH, STEFANKO ([id number]) Assessment Active Problems ICD-10 E11.621 - Type 2 diabetes mellitus with foot ulcer L89.512 - Pressure ulcer of right ankle, stage 2 L97.512 - Non-pressure chronic  ulcer of other part of right foot with fat layer exposed E44.1 - Mild protein-calorie malnutrition Plan Wound Cleansing: Wound #1 Right,Lateral Malleolus: Clean wound with Normal Saline. Cleanse wound with mild soap and water Wound #3 Right,Plantar Foot: Clean wound with Normal Saline. Cleanse wound with mild soap and water Anesthetic: Wound #1 Right,Lateral Malleolus: Topical Lidocaine 4% cream applied to wound bed prior to debridement Wound #3 Right,Plantar Foot: Topical Lidocaine 4% cream applied to wound bed prior to debridement Primary Wound Dressing: Wound #1 Right,Lateral Malleolus: Santyl  Ointment - IN CLINIC Medihoney gel - AT HOME Wound #3 Right,Plantar Foot: Aquacel Ag Secondary Dressing: Wound #1 Right,Lateral Malleolus: Dry Gauze Conform/Kerlix Wound #3 Right,Plantar Foot: Dry Gauze Conform/Kerlix Drawtex Dressing Change Frequency: Wound #1 Right,Lateral Malleolus: Change dressing every day. Follow-up Appointments: Wound #1 Right,Lateral Malleolus: Return Appointment in 1 week. Edema Control: DEKLIN, BIELER ([id number]) Wound #1 Right,Lateral Malleolus: Elevate legs to the level of the heart and pump ankles as often as possible Off-Loading: Wound #1 Right,Lateral Malleolus: Other: - Keep pressure off the malleolus Wound #3 Right,Plantar Foot: Other: - Keep pressure off the plantar foot Additional Orders / Instructions: Wound #1 Right,Lateral Malleolus: Increase protein intake. Activity as tolerated after a thorough review today and discussing the culture report and the x-rays I have recommended: 1. Close observation and no starting of antibiotic as the only choice is ciprofloxacin and doxycycline. The patient's wife says he may be allergic to doxycycline too. 2. I would use Santyl ointment and a Gore-Tex over his right lateral ankle and silver alginate over the plantar ulcer 3. Encouraged him to have plenty of fluids, proteins and his vitamins 4.  Review next weeks Electronic Signature(s) Signed: 09/01/2016 4:15:02 PM By: Christin Fudge MD, FACS Previous Signature: 09/01/2016 12:05:33 PM Version By: Christin Fudge MD, FACS Entered By: Christin Fudge on 09/01/2016 [place] ([id number]) -------------------------------------------------------------------------------- SuperBill Details Patient Name: [name] Date of Service: 09/01/2016 Medical Record Number: [id number] Patient Account Number: [id number] Date of Birth/Sex: 12/10/1940 (76 y.o. Male) Treating RN: Baruch Gouty, RN, BSN, [place] Primary Care Provider: Enid Derry Other Clinician: Referring Provider: Enid Derry Treating Provider/Extender: Frann Rider in Treatment: 25 Diagnosis Coding ICD-10 Codes Code Description E11.621 Type 2 diabetes mellitus with foot ulcer L89.512 Pressure ulcer of right ankle, stage 2 L97.512 Non-pressure chronic ulcer of other part of right foot with fat layer exposed 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 Physician Procedures CPT4 Code Description: [id number] - WC PHYS LEVEL 3 - EST PT ICD-10 Description Diagnosis E11.621 Type 2 diabetes mellitus with foot ulcer L89.512 Pressure ulcer of right ankle, stage 2 L97.512 Non-pressure chronic ulcer of other part of right fo  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 Modifier: ot with fat lay Quantity: 1 er exposed Electronic Signature(s) Signed: 09/01/2016 12:05:48 PM By: Christin Fudge MD, FACS Entered By: Christin Fudge on 09/01/2016 12:05:47

## 2016-09-04 NOTE — Progress Notes (Addendum)
Jeremiah, Jensen ([id number]) Visit Report for 09/01/2016 Allergy List Details Patient Name: [name], [name]. Date of Service: 09/01/2016 11:15 AM Medical Record Number: [id number] Patient Account Number: [id number] Date of Birth/Sex: Aug 18, 1940 (76 y.o. Male) Treating RN: Baruch Gouty, RN, BSN, Velva Harman Primary Care Sanyah Molnar: Enid Derry Other Clinician: Referring Marciana Uplinger: En[place] Derry Treating Tramaine Snell/Extender: Frann Rider in Treatment: 25 Allergies Active Allergies sufa drugs Cephalosporins lisinopril losartin Keflex amoxicillin ampicillin Septra Bactrim Allergy Notes Electronic Signature(s) Signed: 09/01/2016 11:16:34 AM By: Regan Lemming BSN, RN Entered By: Regan Lemming on 09/01/2016 11:16:33 [name] ([id number]) -------------------------------------------------------------------------------- [place] Details Patient Name: [name] Date of Service: 09/01/2016 11:15 AM Medical Record Number: [id number] Patient Account Number: [id number] Date of Birth/Sex: 08/17/1940 (76 y.o. Male) Treating RN: Baruch Gouty, RN, BSN, [place] Primary Care Sherlene Rickel: Enid Derry Other Clinician: Referring Marianne Golightly: Enid Derry Treating [place] Fant/Extender: Frann Rider in Treatment: 25 Visit Information Patient Arrived: [name] Time: 11:18 Accompanied By: wife Transfer Assistance: None Patient Identification Verified: Yes Secondary Verification Process Yes Completed: Patient Requires Transmission- No Based Precautions: Patient Has Alerts: Yes Patient Alerts: Patient on Blood Thinner DM II 81mg  aspirin twice daily History Since Last Visit All ordered tests and consults were completed: No Added or deleted any medications: No Any new allergies or adverse reactions: No Had a fall or experienced change in activities of daily living that may affect risk of falls: No Signs or symptoms of abuse/neglect since last visito No Hospitalized since last visit:  No Electronic Signature(s) Signed: 09/01/2016 3:47:55 PM By: Regan Lemming BSN, RN Entered By: Regan Lemming on 09/01/2016 11:20:35 [name] ([id number]) -------------------------------------------------------------------------------- Clinic Level of Care Assessment Details Patient Name: [name] Date of Service: 09/01/2016 11:15 AM Medical Record Number: [id number] Patient Account Number: [id number] Date of Birth/Sex: 04-20-1940 (76 y.o. Male) Treating RN: Baruch Gouty, RN, BSN, [place] Primary Care Chalsea Darko: Enid Derry Other Clinician: Referring Larry Alcock: Enid Derry Treating Cindel Daugherty/Extender: Frann Rider in Treatment: 25 Clinic Level of Care Assessment Items TOOL 4 Quantity Score []  - Use when only an EandM is performed on FOLLOW-UP visit 0 ASSESSMENTS - Nursing Assessment / Reassessment X - Reassessment of Co-morbidities (includes updates in patient status) 1 10 X - Reassessment of Adherence to Treatment Plan 1 5 ASSESSMENTS - Wound and Skin Assessment / Reassessment []  - Simple Wound Assessment / Reassessment - one wound 0 X - Complex Wound Assessment / Reassessment - multiple wounds 2 5 []  - Dermatologic / Skin Assessment (not related to wound area) 0 ASSESSMENTS - Focused Assessment []  - Circumferential Edema Measurements - multi extremities 0 []  - Nutritional Assessment / Counseling / Intervention 0 X - Lower Extremity Assessment (monofilament, tuning fork, pulses) 1 5 []  - Peripheral Arterial Disease Assessment (using hand held doppler) 0 ASSESSMENTS - Ostomy and/or Continence Assessment and Care []  - Incontinence Assessment and Management 0 []  - Ostomy Care Assessment and Management (repouching, etc.) 0 PROCESS - Coordination of Care X - Simple Patient / Family Education for ongoing care 1 15 []  - Complex (extensive) Patient / Family Education for ongoing care 0 []  - Staff obtains Programmer, systems, Records, Test Results / Process Orders 0 []  - Staff telephones HHA,  Nursing Homes / Clarify orders / etc 0 []  - Routine Transfer to another Facility (non-emergent condition) 0 [name], [name] ([id number]) []  - Routine Hospital Admission (non-emergent condition) 0 []  - New Admissions / Insurance Authorizations / Ordering NPWT, Apligraf, etc. 0 []  - Emergency Hospital Admission (emergent condition) 0 []  -  Simple Discharge Coordination 0 []  - Complex (extensive) Discharge Coordination 0 PROCESS - Special Needs []  - Pediatric / Minor Patient Management 0 []  - Isolation Patient Management 0 []  - Hearing / Language / Visual special needs 0 []  - Assessment of Community assistance (transportation, D/C planning, etc.) 0 []  - Additional assistance / Altered mentation 0 []  - Support Surface(s) Assessment (bed, cushion, seat, etc.) 0 INTERVENTIONS - Wound Cleansing / Measurement []  - Simple Wound Cleansing - one wound 0 X - Complex Wound Cleansing - multiple wounds 2 5 []  - Wound Imaging (photographs - any number of wounds) 0 []  - Wound Tracing (instead of photographs) 0 []  - Simple Wound Measurement - one wound 0 X - Complex Wound Measurement - multiple wounds 2 5 INTERVENTIONS - Wound Dressings []  - Small Wound Dressing one or multiple wounds 0 X - Medium Wound Dressing one or multiple wounds 2 15 []  - Large Wound Dressing one or multiple wounds 0 []  - Application of Medications - topical 0 []  - Application of Medications - injection 0 INTERVENTIONS - Miscellaneous []  - External ear exam 0 [name], [name] ([id number]) []  - Specimen Collection (cultures, biopsies, blood, body fluids, etc.) 0 []  - Specimen(s) / Culture(s) sent or taken to Lab for analysis 0 []  - Patient Transfer (multiple staff / Harrel Lemon Lift / Similar devices) 0 []  - Simple Staple / Suture removal (25 or less) 0 []  - Complex Staple / Suture removal (26 or more) 0 []  - Hypo / Hyperglycemic Management (close monitor of Blood Glucose) 0 []  - Ankle / Brachial Index (ABI) - do not check if billed  separately 0 X - Vital Signs 1 5 Has the patient been seen at the hospital within the last three years: Yes Total Score: 100 Level Of Care: New/Established - Level 3 Electronic Signature(s) Signed: 09/01/2016 3:47:55 PM By: Regan Lemming BSN, RN Entered By: Regan Lemming on 09/01/2016 12:07:13 [name] ([id number]) -------------------------------------------------------------------------------- Encounter Discharge Information Details Patient Name: [name] Date of Service: 09/01/2016 11:15 AM Medical Record Number: [id number] Patient Account Number: [id number] Date of Birth/Sex: 1941-01-26 (76 y.o. Male) Treating RN: Baruch Gouty, RN, BSN, Velva Harman Primary Care Taje Tondreau: Enid Derry Other Clinician: Referring Xaden Kaufman: En[place] Derry Treating Berdie Malter/Extender: Frann Rider in Treatment: 25 Encounter Discharge Information Items Discharge Pain Level: 0 Discharge Condition: Stable Ambulatory Status: Walker Discharge Destination: Home Transportation: Private Auto Accompanied By: Hansel Feinstein Schedule Follow-up Appointment: No Medication Reconciliation completed No and provided to Patient/Care Taysen Bushart: Provided on Clinical Summary of Care: 09/01/2016 Form Type Recipient Paper Patient FW Electronic Signature(s) Signed: 09/01/2016 3:47:55 PM By: Regan Lemming BSN, RN Previous Signature: 09/01/2016 12:07:23 PM Version By: Ruthine Dose Entered By: Regan Lemming on 09/01/2016 12:08:14 [name] ([id number]) -------------------------------------------------------------------------------- Lower Extremity Assessment Details Patient Name: [name] Date of Service: 09/01/2016 11:15 AM Medical Record Number: [id number] Patient Account Number: [id number] Date of Birth/Sex: January 21, 1941 (76 y.o. Male) Treating RN: Baruch Gouty, RN, BSN, Velva Harman Primary Care Linton Stolp: Enid Derry Other Clinician: Referring Aliviya Schoeller: Enid Derry Treating Anella Nakata/Extender: Frann Rider in  Treatment: 25 Vascular Assessment Claudication: Claudication Assessment [Right:None] Pulses: Dorsalis Pedis Palpable: [Right:Yes] Posterior Tibial Extremity colors, hair growth, and conditions: Extremity Color: [Right:Normal] Hair Growth on Extremity: [Right:Yes] Temperature of Extremity: [Right:Warm] Capillary Refill: [Right:< 3 seconds] Electronic Signature(s) Signed: 09/01/2016 3:47:55 PM By: Regan Lemming BSN, RN Entered By: Regan Lemming on 09/01/2016 11:21:34 [name] ([id number]) -------------------------------------------------------------------------------- Multi Wound Chart Details Patient Name: [name] Date of Service: 09/01/2016 11:15  AM Medical Record Number: [id number] Patient Account Number: [id number] Date of Birth/Sex: 1940-03-04 (76 y.o. 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] [N/A:N/A] Secondary Etiology: [1:Diabetic Wound/Ulcer of the Lower Extremity] [3:N/A] [N/A:N/A] Comorbid History: [1:Cataracts, Chronic sinus problems/congestion, Anemia, Sleep Apnea, Hypertension, Myocardial Infarction, Type II Diabetes, Received Chemotherapy, Received Radiation] [3:Cataracts, Chronic sinus problems/congestion, Anemia, Sleep  Apnea, Hypertension, Myocardial Infarction, Type II Diabetes, Received Chemotherapy, Received Radiation] [N/A:N/A] Date Acquired:  [1:01/09/2016] [3:06/15/2016] [N/A:N/A] Weeks of Treatment: [1:25] [3:11] [N/A:N/A] Wound Status: [1:Open] [3:Open] [N/A:N/A] Measurements L x W x D 1.3x1.4x0.2 [3:1x1.3x0.1] [N/A:N/A] (cm) Area (cm) : [1:1.429] [3:1.021] [N/A:N/A] Volume (cm) : [1:0.286] [3:0.102] [N/A:N/A] % Reduction in Area: [1:-505.50%] [3:42.20%] [N/A:N/A] % Reduction in Volume: -1091.70% [3:96.20%] [N/A:N/A] Classification: [1:Category/Stage III] [3:Grade 1] [N/A:N/A] HBO Classification: [1:Grade 1] [3:N/A] [N/A:N/A] Exudate Amount: [1:Large] [3:Large] [N/A:N/A] Exudate Type: [1:Serous] [3:Serous] [N/A:N/A] Exudate Color: [1:amber] [3:amber] [N/A:N/A] Wound Margin: [1:Flat and Intact] [3:Distinct, outline attached] [N/A:N/A] Granulation Amount: Small (1-33%) Large (67-100%) N/A Granulation Quality: Pink, Pale Pink, Pale N/A Necrotic Amount: Large (67-100%) Small (1-33%) N/A Exposed Structures: Fat Layer (Subcutaneous Fat Layer (Subcutaneous N/A Tissue) Exposed: Yes Tissue) Exposed: Yes Fascia: No Fascia: No Tendon: No Tendon: No Muscle: No Muscle: No Joint: No Joint: No Bone: No Bone: No Epithelialization: None None N/A Periwound Skin Texture: Induration: Yes Excoriation: No N/A Induration: No Callus: No Crepitus: No Rash: No Scarring: No Periwound Skin Maceration: Yes Maceration: Yes N/A Moisture: Dry/Scaly: No Dry/Scaly: No Periwound Skin Color: Erythema: Yes Atrophie Blanche: No N/A Cyanosis: No Ecchymosis: No Erythema: No Hemosiderin Staining: No Mottled: No Pallor: No Rubor: No Erythema Location: Circumferential N/A N/A Erythema Change: Decreased N/A N/A Temperature: No Abnormality No Abnormality N/A Tenderness on Yes Yes N/A Palpation: Wound Preparation: Ulcer Cleansing: Ulcer Cleansing: N/A Rinsed/Irrigated with Rinsed/Irrigated with Saline Saline Topical Anesthetic Topical Anesthetic Applied: Other: lidocaine Applied: Other: lidocaine 4% 4% Treatment Notes Electronic  Signature(s) Signed: 09/01/2016 12:02:58 PM By: Christin Fudge MD, FACS Entered By: Christin Fudge on 09/01/2016 12:02:57 [name] ([id number]) -------------------------------------------------------------------------------- [place] Details Patient Name: [name], [name] Date of Service: 09/01/2016 11:15 AM Medical Record Number: [id number] Patient Account Number: [id number] Date of Birth/Sex: [birth year] (76 y.o. Male) Treating RN: Baruch Gouty, RN, BSN, Velva Harman Primary Care Zayla Agar: Enid Derry Other Clinician: Referring Lakisa Lotz: Enid Derry Treating Hennessey Cantrell/Extender: Frann Rider in Treatment: 25 Active Inactive ` Orientation to the Wound Care Program Nursing Diagnoses: Knowledge deficit related to the wound healing center program Goals: Patient/caregiver will verbalize understanding of the [place] Program Date Initiated: 03/10/2016 Target Resolution Date: 03/23/2016 Goal Status: Active Interventions: Provide education on orientation to the wound center Notes: ` Soft Tissue Infection Nursing Diagnoses: Impaired tissue integrity Potential for infection: soft tissue Goals: Patient will remain free of wound infection Date Initiated: 03/10/2016 Target Resolution Date: 03/17/2016 Goal Status: Active Interventions: Assess signs and symptoms of infection every visit Notes: ` Wound/Skin Impairment Nursing Diagnoses: Impaired tissue integrity KEIR, VIERNES ([id number]) Goals: Ulcer/skin breakdown will heal within 14 weeks Date Initiated: 03/10/2016 Target Resolution Date: 03/24/2016 Goal Status: Active  Interventions: Assess patient/caregiver ability to obtain necessary supplies Notes: Electronic Signature(s) Signed: 09/01/2016 3:47:55 PM By: Regan Lemming BSN, RN Entered By: Regan Lemming on 09/01/2016 11:54:20 [name] ([id number]) -------------------------------------------------------------------------------- Pain Assessment  Details Patient Name: [name] Date of Service: 09/01/2016 11:15 AM Medical Record Number: [id number] Patient Account Number: [id number] Date of Birth/Sex: September 30, 1940 (77 y.o. Male) Treating RN: Baruch Gouty, RN, BSN, Velva Harman Primary Care Japhet Morgenthaler: Enid Derry Other Clinician: Referring Landen Knoedler: Enid Derry Treating Tane Biegler/Extender: Frann Rider in Treatment: 25 Active Problems Location of Pain Severity and Description of Pain Patient Has Paino No Site Locations With Dressing Change: No Pain Management and Medication Current Pain Management: Electronic Signature(s) Signed: 09/01/2016 3:47:55 PM By: Regan Lemming BSN, RN Entered By: Regan Lemming on 09/01/2016 11:20:42 [name] ([id number]) -------------------------------------------------------------------------------- Patient/Caregiver Education Details Patient Name: [name] Date of Service: 09/01/2016 11:15 AM Medical Record Number: [id number] Patient Account Number: [id number] Date of Birth/Gender: 1940/07/11 (76 y.o. Male) Treating RN: Baruch Gouty, RN, BSN, Velva Harman Primary Care Physician: Enid Derry Other Clinician: Referring Physician: Enid Derry Treating Physician/Extender: Frann Rider in Treatment: 25 Education Assessment Education Provided To: Patient Education Topics Provided Welcome To [facility] [place]: Methods: Explain/Verbal Responses: State content correctly Wound Debridement: Methods: Explain/Verbal Responses: State content correctly Wound/Skin Impairment: Methods: Explain/Verbal Responses: State content correctly Electronic Signature(s) Signed: 09/01/2016 3:47:55 PM By: Regan Lemming BSN, RN Entered By: Regan Lemming on 09/01/2016 12:08:30 [name] ([id number]) -------------------------------------------------------------------------------- Wound Assessment Details Patient Name: [name] Date of Service: 09/01/2016 11:15 AM Medical Record Number:  [id number] Patient Account Number: [id number] Date of Birth/Sex: May 28, 1940 (76 y.o. Male) Treating RN: Baruch Gouty, RN, BSN, [place] Primary Care Ailen Strauch: Enid Derry Other Clinician: Referring Shevelle Smither: Enid Derry Treating Lacheryl Niesen/Extender: Frann Rider in Treatment: 25 Wound Status Wound Number: 1 Primary Pressure Ulcer Etiology: Wound Location: Right Malleolus - Lateral Secondary Diabetic Wound/Ulcer of the Lower Wounding Event: Gradually Appeared Etiology: Extremity Date Acquired: 01/09/2016 Wound Open Weeks Of Treatment: 25 Status: Clustered Wound: No Comorbid Cataracts, Chronic sinus History: problems/congestion, Anemia, Sleep Apnea, Hypertension, Myocardial Infarction, Type II Diabetes, Received Chemotherapy, Received Radiation Photos Photo Uploaded By: Regan Lemming on 09/01/2016 15:53:58 Wound Measurements Length: (cm) 1.3 Width: (cm) 1.4 Depth: (cm) 0.2 Area: (cm) 1.429 Volume: (cm) 0.286 % Reduction in Area: -505.5% % Reduction in Volume: -1091.7% Epithelialization: None Tunneling: No Undermining: No Wound Description 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 Severity (Wagner): Grade 1 Slough/Fibrin Wound Margin: Flat and Intact Exudate Amount: Large Exudate Type: Serous Exudate Color: amber [name], [name] ([id number]) er Cleansing: No o Yes Wound Bed Granulation Amount: Small (1-33%) Exposed Structure Granulation Quality: Pink, Pale Fascia Exposed: No Necrotic Amount: Large (67-100%) Fat Layer (Subcutaneous Tissue) Exposed: Yes Necrotic Quality: Adherent Slough Tendon Exposed: No Muscle Exposed: No Joint Exposed: No Bone Exposed: No Periwound Skin Texture Texture Color No Abnormalities Noted: No No Abnormalities Noted: No Induration: Yes Erythema: Yes Erythema Location: Circumferential Moisture Erythema Change: Decreased No Abnormalities Noted: No Dry / Scaly: No Temperature / Pain Maceration: Yes Temperature:  No Abnormality Tenderness on Palpation: Yes Wound Preparation Ulcer Cleansing: Rinsed/Irrigated with Saline Topical Anesthetic Applied: Other: lidocaine 4%, Treatment Notes Wound #1 (Right, Lateral Malleolus) 1. Cleansed with: Clean wound with Normal Saline 4. Dressing Applied: Aquacel Ag Santyl Ointment 5. Secondary Dressing Applied Gauze and Kerlix/Conform 7. Secured with Recruitment consultant) Signed: 09/01/2016 3:47:55 PM By: Regan Lemming BSN, RN Entered By: Regan Lemming on 09/01/2016 11:53:37 [name] ([id number]) -------------------------------------------------------------------------------- Wound  Assessment Details Patient Name: [name], PERZ. Date of Service: 09/01/2016 11:15 AM Medical Record Number: [id number] Patient Account Number: [id number] Date of Birth/Sex: 1941-02-08 (76 y.o. Male) Treating RN: Baruch Gouty, RN, BSN, [place] Primary Care Ersel Wadleigh: Enid Derry Other Clinician: Referring Kathern Lobosco: Enid Derry Treating Neidy Guerrieri/Extender: Frann Rider in Treatment: 25 Wound Status Wound Number: 3 Primary Diabetic Wound/Ulcer of the Lower Etiology: Extremity Wound Location: Right Foot - Plantar Wound Open Wounding Event: Pressure Injury Status: Date Acquired: 06/15/2016 Comorbid Cataracts, Chronic sinus Weeks Of Treatment: 11 History: problems/congestion, Anemia, Sleep Clustered Wound: No Apnea, Hypertension, Myocardial Infarction, Type II Diabetes, Received Chemotherapy, Received Radiation Photos Photo Uploaded By: Regan Lemming on 09/01/2016 15:54:21 Wound Measurements Length: (cm) 1 Width: (cm) 1.3 Depth: (cm) 0.1 Area: (cm) 1.021 Volume: (cm) 0.102 % Reduction in Area: 42.2% % Reduction in Volume: 96.2% Epithelialization: None Tunneling: No Undermining: No Wound Description Classification: Grade 1 Foul Odor Aft Wound Margin: Distinct, outline attached Slough/Fibrin Exudate Amount: Large Exudate Type: Serous Exudate Color:  amber er Cleansing: No o Yes Wound Bed Granulation Amount: Large (67-100%) Exposed Structure Granulation Quality: Pink, Pale Fascia Exposed: No [name], [name] ([id number]) Necrotic Amount: Small (1-33%) Fat Layer (Subcutaneous Tissue) Exposed: Yes Necrotic Quality: Adherent Slough Tendon Exposed: No Muscle Exposed: No Joint Exposed: No Bone Exposed: No Periwound Skin Texture Texture Color No Abnormalities Noted: No No Abnormalities Noted: No Callus: No Atrophie Blanche: No Crepitus: No Cyanosis: No Excoriation: No Ecchymosis: No Induration: No Erythema: No Rash: No Hemosiderin Staining: No Scarring: No Mottled: No Pallor: No Moisture Rubor: No No Abnormalities Noted: No Dry / Scaly: No Temperature / Pain Maceration: Yes Temperature: No Abnormality Tenderness on Palpation: Yes Wound Preparation Ulcer Cleansing: Rinsed/Irrigated with Saline Topical Anesthetic Applied: Other: lidocaine 4%, Treatment Notes Wound #3 (Right, Plantar Foot) 1. Cleansed with: Clean wound with Normal Saline 4. Dressing Applied: Aquacel Ag Santyl Ointment 5. Secondary Dressing Applied Gauze and Kerlix/Conform 7. Secured with Recruitment consultant) Signed: 09/01/2016 3:47:55 PM By: Regan Lemming BSN, RN Entered By: Regan Lemming on 09/01/2016 11:54:09 [name] ([id number]) -------------------------------------------------------------------------------- [place] Details Patient Name: [name] Date of Service: 09/01/2016 11:15 AM Medical Record Number: [id number] Patient Account Number: [id number] Date of Birth/Sex: Jan 03, 1941 (76 y.o. Male) Treating RN: Baruch Gouty, RN, BSN, [place] Primary Care Jaelie Aguilera: Enid Derry Other Clinician: Referring Tayquan Gassman: Enid Derry Treating Raechell Singleton/Extender: Frann Rider in Treatment: 25 Vital Signs Time Taken: 11:20 Temperature (F): 97.8 Height (in): 67 Pulse (bpm): 74 Weight (lbs): 136 Respiratory Rate (breaths/min):  16 Body Mass Index (BMI): 21.3 Blood Pressure (mmHg): 109/41 Reference Range: 80 - 120 mg / dl Electronic Signature(s) Signed: 09/01/2016 3:47:55 PM By: Regan Lemming BSN, RN Entered By: Regan Lemming on 09/01/2016 11:21:17

## 2016-09-06 DIAGNOSIS — R1310 Dysphagia, unspecified: Secondary | ICD-10-CM | POA: Diagnosis not present

## 2016-09-06 DIAGNOSIS — R634 Abnormal weight loss: Secondary | ICD-10-CM | POA: Diagnosis not present

## 2016-09-06 DIAGNOSIS — D509 Iron deficiency anemia, unspecified: Secondary | ICD-10-CM | POA: Diagnosis not present

## 2016-09-06 DIAGNOSIS — C787 Secondary malignant neoplasm of liver and intrahepatic bile duct: Secondary | ICD-10-CM | POA: Diagnosis not present

## 2016-09-06 DIAGNOSIS — C155 Malignant neoplasm of lower third of esophagus: Secondary | ICD-10-CM | POA: Diagnosis not present

## 2016-09-06 DIAGNOSIS — F0390 Unspecified dementia without behavioral disturbance: Secondary | ICD-10-CM | POA: Diagnosis not present

## 2016-09-07 DIAGNOSIS — F0390 Unspecified dementia without behavioral disturbance: Secondary | ICD-10-CM | POA: Diagnosis not present

## 2016-09-07 DIAGNOSIS — D509 Iron deficiency anemia, unspecified: Secondary | ICD-10-CM | POA: Diagnosis not present

## 2016-09-07 DIAGNOSIS — R634 Abnormal weight loss: Secondary | ICD-10-CM | POA: Diagnosis not present

## 2016-09-07 DIAGNOSIS — C155 Malignant neoplasm of lower third of esophagus: Secondary | ICD-10-CM | POA: Diagnosis not present

## 2016-09-07 DIAGNOSIS — C787 Secondary malignant neoplasm of liver and intrahepatic bile duct: Secondary | ICD-10-CM | POA: Diagnosis not present

## 2016-09-07 DIAGNOSIS — R1310 Dysphagia, unspecified: Secondary | ICD-10-CM | POA: Diagnosis not present

## 2016-09-08 ENCOUNTER — Telehealth: Payer: Self-pay | Admitting: *Deleted

## 2016-09-08 ENCOUNTER — Ambulatory Visit: Payer: Medicare Other | Admitting: Surgery

## 2016-09-08 DIAGNOSIS — R1310 Dysphagia, unspecified: Secondary | ICD-10-CM | POA: Diagnosis not present

## 2016-09-08 DIAGNOSIS — R634 Abnormal weight loss: Secondary | ICD-10-CM | POA: Diagnosis not present

## 2016-09-08 DIAGNOSIS — C155 Malignant neoplasm of lower third of esophagus: Secondary | ICD-10-CM | POA: Diagnosis not present

## 2016-09-08 DIAGNOSIS — C787 Secondary malignant neoplasm of liver and intrahepatic bile duct: Secondary | ICD-10-CM | POA: Diagnosis not present

## 2016-09-08 DIAGNOSIS — F0390 Unspecified dementia without behavioral disturbance: Secondary | ICD-10-CM | POA: Diagnosis not present

## 2016-09-08 DIAGNOSIS — D509 Iron deficiency anemia, unspecified: Secondary | ICD-10-CM | POA: Diagnosis not present

## 2016-09-08 NOTE — Telephone Encounter (Signed)
Returned call to wife for symptoms and discussion, no answer at this time.

## 2016-09-11 ENCOUNTER — Telehealth: Payer: Self-pay | Admitting: *Deleted

## 2016-09-11 NOTE — Telephone Encounter (Signed)
Per message from Dr. Grayland Ormond, patient currently on Hospice and should be evaluated by the Hospice nurse. Dr. Grayland Ormond will be out of the office this week but we can have the patient seen by Sonia Baller if his wife wishes.

## 2016-09-11 NOTE — Telephone Encounter (Signed)
Patients wife called today. She would like Dr. Grayland Ormond to re-evaluate her husband.

## 2016-09-13 ENCOUNTER — Telehealth: Payer: Self-pay | Admitting: Cardiovascular Disease

## 2016-09-13 DIAGNOSIS — C155 Malignant neoplasm of lower third of esophagus: Secondary | ICD-10-CM | POA: Diagnosis not present

## 2016-09-13 DIAGNOSIS — M810 Age-related osteoporosis without current pathological fracture: Secondary | ICD-10-CM | POA: Diagnosis not present

## 2016-09-13 DIAGNOSIS — E1142 Type 2 diabetes mellitus with diabetic polyneuropathy: Secondary | ICD-10-CM | POA: Diagnosis not present

## 2016-09-13 DIAGNOSIS — M5134 Other intervertebral disc degeneration, thoracic region: Secondary | ICD-10-CM | POA: Diagnosis not present

## 2016-09-13 DIAGNOSIS — Z7982 Long term (current) use of aspirin: Secondary | ICD-10-CM | POA: Diagnosis not present

## 2016-09-13 DIAGNOSIS — E11621 Type 2 diabetes mellitus with foot ulcer: Secondary | ICD-10-CM | POA: Diagnosis not present

## 2016-09-13 DIAGNOSIS — F0390 Unspecified dementia without behavioral disturbance: Secondary | ICD-10-CM | POA: Diagnosis not present

## 2016-09-13 DIAGNOSIS — L97328 Non-pressure chronic ulcer of left ankle with other specified severity: Secondary | ICD-10-CM | POA: Diagnosis not present

## 2016-09-13 DIAGNOSIS — M1991 Primary osteoarthritis, unspecified site: Secondary | ICD-10-CM | POA: Diagnosis not present

## 2016-09-13 DIAGNOSIS — R1310 Dysphagia, unspecified: Secondary | ICD-10-CM | POA: Diagnosis not present

## 2016-09-13 DIAGNOSIS — L97428 Non-pressure chronic ulcer of left heel and midfoot with other specified severity: Secondary | ICD-10-CM | POA: Diagnosis not present

## 2016-09-13 DIAGNOSIS — Z7984 Long term (current) use of oral hypoglycemic drugs: Secondary | ICD-10-CM | POA: Diagnosis not present

## 2016-09-13 DIAGNOSIS — D509 Iron deficiency anemia, unspecified: Secondary | ICD-10-CM | POA: Diagnosis not present

## 2016-09-13 DIAGNOSIS — I10 Essential (primary) hypertension: Secondary | ICD-10-CM | POA: Diagnosis not present

## 2016-09-13 DIAGNOSIS — C787 Secondary malignant neoplasm of liver and intrahepatic bile duct: Secondary | ICD-10-CM | POA: Diagnosis not present

## 2016-09-13 DIAGNOSIS — E039 Hypothyroidism, unspecified: Secondary | ICD-10-CM | POA: Diagnosis not present

## 2016-09-13 DIAGNOSIS — R634 Abnormal weight loss: Secondary | ICD-10-CM | POA: Diagnosis not present

## 2016-09-13 MED ORDER — ISOSORBIDE MONONITRATE ER 60 MG PO TB24: 30.0000 mg | ORAL_TABLET | Freq: Every day | ORAL | 6 refills | Status: AC

## 2016-09-13 NOTE — Telephone Encounter (Signed)
No answer. Left message to call back.   

## 2016-09-13 NOTE — Telephone Encounter (Signed)
Received incoming call from [place] with Hospice. Stated patient's BP has been staying around 100/60, HR from 72-80 over the past few weeks. He experiences dizziness and lightheadedness upon standing. Because of patient's declining condition he is eating less and doing less activity. Wife does not want to stop the isosorbide completely but is fine if it is decreased. Wife is ok to discontinue metoprolol if advised. Dr Fletcher Anon out of office this week.  Will route to Dr End for review as he is DOD.

## 2016-09-13 NOTE — Telephone Encounter (Signed)
S/w Lisa at [facility]. She verbalized understanding to decrease Isosorbide to 30 mg once a day. She said a new Rx did not need to be sent in.

## 2016-09-13 NOTE — Telephone Encounter (Signed)
I think it is reasonable for Mr. Lortie to cut isosorbide mononitrate down to 30 mg daily. If he has persistent dizziness or recurrent chest pain, he should contact the office for further evaluation.  Nelva Bush, MD [facility] Pager: [phone]

## 2016-09-13 NOTE — Telephone Encounter (Signed)
Nurse is asking to D/C or decrease Isosorbide, or Metoprolol, due to BP staying around 100/60, is experiencing dizziness and lightheadedness upon standing. Please call and advise.

## 2016-09-14 DIAGNOSIS — R1310 Dysphagia, unspecified: Secondary | ICD-10-CM | POA: Diagnosis not present

## 2016-09-14 DIAGNOSIS — C787 Secondary malignant neoplasm of liver and intrahepatic bile duct: Secondary | ICD-10-CM | POA: Diagnosis not present

## 2016-09-14 DIAGNOSIS — F0390 Unspecified dementia without behavioral disturbance: Secondary | ICD-10-CM | POA: Diagnosis not present

## 2016-09-14 DIAGNOSIS — R634 Abnormal weight loss: Secondary | ICD-10-CM | POA: Diagnosis not present

## 2016-09-14 DIAGNOSIS — D509 Iron deficiency anemia, unspecified: Secondary | ICD-10-CM | POA: Diagnosis not present

## 2016-09-14 DIAGNOSIS — C155 Malignant neoplasm of lower third of esophagus: Secondary | ICD-10-CM | POA: Diagnosis not present

## 2016-09-15 ENCOUNTER — Ambulatory Visit: Payer: Medicare Other | Admitting: Surgery

## 2016-09-15 ENCOUNTER — Encounter: Attending: Surgery | Admitting: Surgery

## 2016-09-15 DIAGNOSIS — G4730 Sleep apnea, unspecified: Secondary | ICD-10-CM | POA: Insufficient documentation

## 2016-09-15 DIAGNOSIS — I10 Essential (primary) hypertension: Secondary | ICD-10-CM | POA: Diagnosis not present

## 2016-09-15 DIAGNOSIS — E441 Mild protein-calorie malnutrition: Secondary | ICD-10-CM | POA: Diagnosis not present

## 2016-09-15 DIAGNOSIS — D649 Anemia, unspecified: Secondary | ICD-10-CM | POA: Insufficient documentation

## 2016-09-15 DIAGNOSIS — Z880 Allergy status to penicillin: Secondary | ICD-10-CM | POA: Diagnosis not present

## 2016-09-15 DIAGNOSIS — E11621 Type 2 diabetes mellitus with foot ulcer: Secondary | ICD-10-CM | POA: Insufficient documentation

## 2016-09-15 DIAGNOSIS — Z882 Allergy status to sulfonamides status: Secondary | ICD-10-CM | POA: Diagnosis not present

## 2016-09-15 DIAGNOSIS — L97512 Non-pressure chronic ulcer of other part of right foot with fat layer exposed: Secondary | ICD-10-CM | POA: Insufficient documentation

## 2016-09-15 DIAGNOSIS — L89513 Pressure ulcer of right ankle, stage 3: Secondary | ICD-10-CM | POA: Diagnosis not present

## 2016-09-15 DIAGNOSIS — L89512 Pressure ulcer of right ankle, stage 2: Secondary | ICD-10-CM | POA: Diagnosis not present

## 2016-09-15 DIAGNOSIS — C155 Malignant neoplasm of lower third of esophagus: Secondary | ICD-10-CM | POA: Insufficient documentation

## 2016-09-18 NOTE — Progress Notes (Addendum)
Jeremiah, Jensen ([id number]) Visit Report for 09/15/2016 Arrival Information Details Patient Name: [name], [name]. Date of Service: 09/15/2016 9:15 AM Medical Record Number: [id number] Patient Account Number: [id number] Date of Birth/Sex: [birth year] (76 y.o. Male) Treating RN: Montey Hora Primary Care Tyniesha Howald: Enid Derry Other Clinician: Referring Gwenith Tschida: Enid Derry Treating Wrigley Winborne/Extender: Frann Rider in Treatment: 1 Visit Information History Since Last Visit Added or deleted any medications: No Patient Arrived: Walker Any new allergies or adverse reactions: No Arrival Time: 09:22 Had a fall or experienced change in No Accompanied By: spouse activities of daily living that may affect Transfer Assistance: None risk of falls: Patient Identification Verified: Yes Signs or symptoms of abuse/neglect since last No Secondary Verification Process Yes visito Completed: Hospitalized since last visit: No Patient Requires Transmission- No Has Dressing in Place as Prescribed: Yes Based Precautions: Pain Present Now: No Patient Has Alerts: Yes Patient Alerts: Patient on Blood Thinner DM II 81mg  aspirin twice daily Electronic Signature(s) Signed: 09/15/2016 4:59:41 PM By: Montey Hora Entered By: Montey Hora on 09/15/2016 09:24:39 [name] ([id number]) -------------------------------------------------------------------------------- Encounter Discharge Information Details Patient Name: [name] Date of Service: 09/15/2016 9:15 AM Medical Record Number: [id number] Patient Account Number: [id number] Date of Birth/Sex: 1940-08-06 (76 y.o. Male) Treating RN: Montey Hora Primary Care Joselyn Edling: Enid Derry Other Clinician: Referring Trexton Escamilla: Enid Derry Treating Tashon Capp/Extender: Frann Rider in Treatment: 86 Encounter Discharge Information Items Discharge Pain Level: 0 Discharge Condition: Stable Ambulatory Status:  Walker Discharge Destination: Home Transportation: Private Auto Accompanied By: spouse Schedule Follow-up Appointment: Yes Medication Reconciliation completed No and provided to Patient/Care Penny Arrambide: Provided on Clinical Summary of Care: 09/15/2016 Form Type Recipient Paper Patient FW Electronic Signature(s) Signed: 09/15/2016 10:12:43 AM By: Ruthine Dose Entered By: Ruthine Dose on 09/15/2016 10:12:43 [name] ([id number]) -------------------------------------------------------------------------------- Lower Extremity Assessment Details Patient Name: [name] Date of Service: 09/15/2016 9:15 AM Medical Record Number: [id number] Patient Account Number: [id number] Date of Birth/Sex: 02-Apr-1940 (76 y.o. Male) Treating RN: Montey Hora Primary Care Theoplis Garciagarcia: Enid Derry Other Clinician: Referring Tarrin Lebow: Enid Derry Treating Hosanna Betley/Extender: Frann Rider in Treatment: 27 Vascular Assessment Pulses: Dorsalis Pedis Palpable: [Right:Yes] Posterior Tibial Extremity colors, hair growth, and conditions: Extremity Color: [Right:Normal] Hair Growth on Extremity: [Right:No] Temperature of Extremity: [Right:Warm] Capillary Refill: [Right:< 3 seconds] Electronic Signature(s) Signed: 09/15/2016 4:59:41 PM By: Montey Hora Entered By: Montey Hora on 09/15/2016 09:45:47 [name] ([id number]) -------------------------------------------------------------------------------- Multi Wound Chart Details Patient Name: [name] Date of Service: 09/15/2016 9:15 AM Medical Record Number: [id number] Patient Account Number: [id number] Date of Birth/Sex: Mar 27, 1940 (76 y.o. 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 Comorbid History: Cataracts, Chronic sinus Cataracts, Chronic sinus N/A problems/congestion, problems/congestion, Anemia, Sleep Apnea, Anemia, Sleep Apnea, Hypertension, Myocardial Hypertension, Myocardial Infarction, Type II Infarction, Type II Diabetes, Received Diabetes, Received Chemotherapy, Received Chemotherapy, Received Radiation Radiation Date Acquired: 01/09/2016 06/15/2016 N/A Weeks of Treatment: 27 13 N/A Wound Status: Open Open N/A Measurements L x W x D 1.4x1.7x0.2 1.1x1.1x0.1 N/A (cm) Area (cm) : 1.869  0.95 N/A Volume (cm) : 0.374 0.095 N/A % Reduction in Area: -691.90% 46.20% N/A % Reduction in Volume: -1458.30% 96.40% N/A DAGOBERTO, NEALY ([id number]) Classification: Category/Stage III Grade 1 N/A HBO Classification: Grade 1 N/A N/A Exudate Amount: Large Large N/A Exudate Type: Serous Serous N/A Exudate Color: amber amber N/A Wound Margin: Flat and Intact Distinct, outline attached N/A Granulation Amount: Small (1-33%) Large (67-100%) N/A Granulation Quality: Pink, Pale Pink, Pale N/A Necrotic Amount: Large (67-100%) Small (1-33%) N/A Exposed Structures: Fat Layer (Subcutaneous Fat Layer (Subcutaneous N/A Tissue) Exposed: Yes Tissue) Exposed: Yes Fascia: No Fascia: No Tendon: No Tendon: No Muscle: No Muscle: No Joint: No Joint: No Bone: No Bone: No Epithelialization: None None N/A Debridement: Debridement (10175- N/A N/A 11047) Pre-procedure 09:49 N/A N/A Verification/Time Out Taken: Pain Control: Lidocaine 4% Topical N/A N/A Solution Tissue  Debrided: Necrotic/Eschar, N/A N/A Fibrin/Slough, Subcutaneous Level: Skin/Subcutaneous N/A N/A Tissue Debridement Area (sq 2.38 N/A N/A cm): Instrument: Forceps, Scissors N/A N/A Bleeding: Minimum N/A N/A Hemostasis Achieved: Pressure N/A N/A Procedural Pain: 0 N/A N/A Post Procedural Pain: 0 N/A N/A Debridement Treatment Procedure was tolerated N/A N/A Response: well Post Debridement 1.4x1.7x0.3 N/A N/A Measurements L x W x D (cm) Post Debridement 0.561 N/A N/A Volume: (cm) Post Debridement Category/Stage III N/A N/A Stage: Periwound Skin Texture: Induration: Yes Excoriation: No N/A Induration: No Callus: No Crepitus: No THEODIS, KINSEL ([id number]) Rash: No Scarring: No Periwound Skin Maceration: Yes Maceration: Yes N/A Moisture: Dry/Scaly: No Dry/Scaly: No Periwound Skin Color: Erythema: Yes Atrophie Blanche: No N/A Cyanosis: No Ecchymosis: No Erythema: No Hemosiderin Staining: No Mottled: No Pallor: No Rubor: No Erythema Location: Circumferential N/A N/A Erythema Change: Decreased N/A N/A Temperature: No Abnormality No Abnormality N/A Tenderness on Yes Yes N/A Palpation: Wound Preparation: Ulcer Cleansing: Ulcer Cleansing: N/A Rinsed/Irrigated with Rinsed/Irrigated with Saline Saline Topical Anesthetic Topical Anesthetic Applied: Other: lidocaine Applied: Other: lidocaine 4% 4% Procedures Performed: Debridement N/A N/A Treatment Notes Electronic Signature(s) Signed: 09/15/2016 10:06:55 AM By: Christin Fudge MD, FACS Entered By: Christin Fudge on 09/15/2016 10:06:55 [name] ([id number]) -------------------------------------------------------------------------------- [place] Details Patient Name: TRYSON, LUMLEY Date of Service: 09/15/2016 9:15 AM Medical Record Number: [id number] Patient Account Number: [id number] Date of Birth/Sex: [birth year] (77 y.o. Male) Treating RN: Montey Hora Primary Care Deshon Koslowski: Enid Derry Other Clinician: Referring Klaus Casteneda: Enid Derry Treating Rayfield Beem/Extender: Frann Rider in Treatment: 16 Active Inactive Electronic Signature(s) Signed: 10/24/2016 8:12:03 AM By: Gretta Cool, BSN, RN, CWS, Kim RN, BSN Signed: 10/30/2016 1:05:44 PM By: Montey Hora Previous Signature: 09/15/2016 4:59:41 PM Version By: Montey Hora Entered By: Gretta Cool BSN, RN, CWS, Kim on 10/19/2016 09:19:10 [name] ([id number]) -------------------------------------------------------------------------------- Pain Assessment Details Patient Name: EVO, ADERMAN Date of Service: 09/15/2016 9:15 AM Medical Record Number: [id number] Patient Account Number: [id number] Date of Birth/Sex: 03-Oct-1940 (76 y.o. Male) Treating RN: Montey Hora Primary Care Gabe Glace: Enid Derry Other Clinician: Referring Namiah Dunnavant: Enid Derry Treating Gearl Baratta/Extender: Frann Rider in Treatment: 70 Active Problems Location of Pain Severity and Description of Pain Patient Has Paino Yes Site Locations Pain Location: Pain in Ulcers With Dressing Change: Yes Duration of the Pain. Constant / Intermittento Constant Pain Management and Medication Current Pain Management: Notes Topical or injectable lidocaine is offered to patient for acute pain when surgical debridement is performed. If needed, Patient is instructed to use over the counter pain medication for the following 24-48 hours after debridement. Wound care MDs do not prescribed pain medications. Patient has chronic pain or  uncontrolled pain. Patient has been instructed to make an appointment with their Primary Care Physician for pain management. Electronic Signature(s) Signed: 09/15/2016 4:59:41 PM By: Montey Hora Entered By: Montey Hora on 09/15/2016 09:25:50 [name] ([id number]) -------------------------------------------------------------------------------- Patient/Caregiver Education Details Patient Name: [name] Date of Service: 09/15/2016 9:15 AM Medical Record Number: [id number] Patient Account Number: [id number] Date of Birth/Gender: 1940/10/04 (76 y.o. Male) Treating RN: Montey Hora Primary Care Physician: Enid Derry Other Clinician: Referring Physician: Enid Derry Treating Physician/Extender: Frann Rider in Treatment: 81 Education Assessment Education Provided To: Patient and Caregiver Education Topics Provided Wound/Skin Impairment: Handouts: Other: wound care as ordered Methods: Demonstration, Explain/Verbal Responses: State content correctly Electronic Signature(s) Signed: 09/15/2016 4:59:41 PM By: Montey Hora Entered By: Montey Hora on 09/15/2016 09:46:51 [name] ([id number]) -------------------------------------------------------------------------------- Wound Assessment Details Patient Name: [name] Date of Service: 09/15/2016 9:15 AM Medical Record Number: [id number] Patient Account Number: [id number] Date of Birth/Sex: 1940-07-14 (76 y.o. Male) Treating RN: Montey Hora Primary Care Emmylou Bieker: Enid Derry Other Clinician: Referring Charlyne Robertshaw: Enid Derry Treating Ahron Hulbert/Extender: Frann Rider in Treatment: 27 Wound Status Wound Number: 1 Primary Pressure Ulcer Etiology: Wound Location: Right Malleolus - Lateral Secondary Diabetic Wound/Ulcer of the Lower Wounding Event: Gradually Appeared Etiology: Extremity Date Acquired: 01/09/2016 Wound Open Weeks Of Treatment: 27 Status: Clustered Wound: No Comorbid Cataracts, Chronic sinus History: problems/congestion, Anemia, Sleep Apnea, Hypertension, Myocardial Infarction, Type II Diabetes, Received Chemotherapy, Received Radiation Photos Photo Uploaded By: Montey Hora on 09/15/2016 09:40:59 Wound Measurements Length: (cm) 1.4 Width: (cm) 1.7 Depth: (cm) 0.2 Area: (cm) 1.869 Volume: (cm) 0.374 % Reduction in Area: -691.9% % Reduction in Volume:  -1458.3% Epithelialization: None Tunneling: No Undermining: No Wound Description 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 Severity (Wagner): Grade 1 Slough/Fibrin Wound Margin: Flat and Intact Exudate Amount: Large Exudate Type: Serous Exudate Color: amber HAIDER, HORNADAY ([id number]) er Cleansing: No o Yes Wound Bed Granulation Amount: Small (1-33%) Exposed Structure Granulation Quality: Pink, Pale Fascia Exposed: No Necrotic Amount: Large (67-100%) Fat Layer (Subcutaneous Tissue) Exposed: Yes Necrotic Quality: Adherent Slough Tendon Exposed: No Muscle Exposed: No Joint Exposed: No Bone Exposed: No Periwound Skin Texture Texture Color No Abnormalities Noted: No No Abnormalities Noted: No Induration: Yes Erythema: Yes Erythema Location: Circumferential Moisture Erythema Change: Decreased No Abnormalities Noted: No Dry / Scaly: No Temperature / Pain Maceration: Yes Temperature: No Abnormality Tenderness on Palpation: Yes Wound Preparation Ulcer Cleansing: Rinsed/Irrigated with Saline Topical Anesthetic Applied: Other: lidocaine 4%, Electronic Signature(s) Signed: 09/15/2016 4:59:41 PM By: Montey Hora Entered By: Montey Hora on 09/15/2016 09:35:07 [name] ([id number]) -------------------------------------------------------------------------------- Wound Assessment Details Patient Name: [name] Date of Service: 09/15/2016 9:15 AM Medical Record Number: [id number] Patient Account Number: [id number] Date of Birth/Sex: 12/08/1940 (76 y.o. Male) Treating RN: Montey Hora Primary Care Modupe Shampine: Enid Derry Other Clinician: Referring Lutricia Widjaja: Enid Derry Treating Lidiya Reise/Extender: Frann Rider in Treatment: 27 Wound Status Wound Number: 3 Primary Diabetic Wound/Ulcer of the Lower Etiology: Extremity Wound Location: Right Foot - Plantar Wound Open Wounding Event: Pressure Injury Status: Date Acquired:  06/15/2016 Comorbid Cataracts, Chronic sinus Weeks Of Treatment: 13 History: problems/congestion, Anemia, Sleep Clustered Wound: No Apnea, Hypertension, Myocardial Infarction, Type II Diabetes, Received Chemotherapy, Received Radiation Photos Photo Uploaded By: Montey Hora on 09/15/2016 09:41:16 Wound Measurements Length: (cm) 1.1 Width: (cm) 1.1 Depth: (cm) 0.1 Area: (cm) 0.95 Volume: (cm) 0.095 % Reduction in Area: 46.2% % Reduction in Volume: 96.4% Epithelialization: None Tunneling: No Undermining: No Wound  Description Classification: Grade 1 Foul Odor Aft Wound Margin: Distinct, outline attached Slough/Fibrin Exudate Amount: Large Exudate Type: Serous Exudate Color: amber er Cleansing: No o Yes Wound Bed Granulation Amount: Large (67-100%) Exposed Structure Granulation Quality: Pink, Pale Fascia Exposed: No TERRANCE, USERY ([id number]) Necrotic Amount: Small (1-33%) Fat Layer (Subcutaneous Tissue) Exposed: Yes Necrotic Quality: Adherent Slough Tendon Exposed: No Muscle Exposed: No Joint Exposed: No Bone Exposed: No Periwound Skin Texture Texture Color No Abnormalities Noted: No No Abnormalities Noted: No Callus: No Atrophie Blanche: No Crepitus: No Cyanosis: No Excoriation: No Ecchymosis: No Induration: No Erythema: No Rash: No Hemosiderin Staining: No Scarring: No Mottled: No Pallor: No Moisture Rubor: No No Abnormalities Noted: No Dry / Scaly: No Temperature / Pain Maceration: Yes Temperature: No Abnormality Tenderness on Palpation: Yes Wound Preparation Ulcer Cleansing: Rinsed/Irrigated with Saline Topical Anesthetic Applied: Other: lidocaine 4%, Electronic Signature(s) Signed: 09/15/2016 4:59:41 PM By: Montey Hora Entered By: Montey Hora on 09/15/2016 09:35:23 [name] ([id number]) -------------------------------------------------------------------------------- [place] Details Patient Name: [name] Date of  Service: 09/15/2016 9:15 AM Medical Record Number: [id number] Patient Account Number: [id number] Date of Birth/Sex: [birth year] (76 y.o. Male) Treating RN: Montey Hora Primary Care Jahad Old: Enid Derry Other Clinician: Referring Isidro Monks: Enid Derry Treating Nekhi Liwanag/Extender: Frann Rider in Treatment: 20 Vital Signs Time Taken: 09:25 Temperature (F): 97.8 Height (in): 67 Pulse (bpm): 75 Weight (lbs): 136 Respiratory Rate (breaths/min): 16 Body Mass Index (BMI): 21.3 Blood Pressure (mmHg): 115/43 Reference Range: 80 - 120 mg / dl Electronic Signature(s) Signed: 09/15/2016 4:59:41 PM By: Montey Hora Entered By: Montey Hora on 09/15/2016 09:27:40

## 2016-09-18 NOTE — Progress Notes (Signed)
EUGEAN, ARNOTT ([id number]) Visit Report for 09/15/2016 Chief Complaint Document Details Patient Name: [name], [name]. Date of Service: 09/15/2016 9:15 AM Medical Record Number: [id number] Patient Account Number: [id number] Date of Birth/Sex: 23-May-1940 (76 y.o. Male) Treating RN: Montey Hora Primary Care Provider: Enid Derry Other Clinician: Referring Provider: Enid Derry Treating Provider/Extender: Frann Rider in Treatment: 16 Information Obtained from: Patient Chief Complaint Patient is at the clinic for treatment of an open pressure ulcer to the right lateral ankle Electronic Signature(s) Signed: 09/15/2016 10:07:38 AM By: Christin Fudge MD, FACS Entered By: Christin Fudge on 09/15/2016 10:07:38 [name] ([id number]) -------------------------------------------------------------------------------- Debridement Details Patient Name: [name] Date of Service: 09/15/2016 9:15 AM Medical Record Number: [id number] Patient Account Number: [id number] Date of Birth/Sex: 1940/06/13 (76 y.o. Male) Treating RN: Montey Hora Primary Care Provider: Enid Derry Other Clinician: Referring Provider: Enid Derry Treating Provider/Extender: Frann Rider in Treatment: 27 Debridement Performed for Wound #1 Right,Lateral Malleolus Assessment: Performed By: Physician Christin Fudge, MD Debridement: Debridement Severity of Tissue Pre Fat layer exposed Debridement: Pre-procedure Verification/Time Out Yes - 09:49 Taken: Start Time: 09:49 Pain Control: Lidocaine 4% Topical Solution Level: Skin/Subcutaneous Tissue Total Area Debrided (L x 1.4 (cm) x 1.7 (cm) = 2.38 (cm) W): Tissue and other Viable, Non-Viable, Cartilage, Eschar, Fibrin/Slough, Subcutaneous material debrided: Instrument: Forceps, Scissors Bleeding: Minimum Hemostasis Achieved: Pressure End Time: 09:52 Procedural Pain: 0 Post Procedural Pain: 0 Response to Treatment: Procedure was  tolerated well Post Debridement Measurements of Total Wound Length: (cm) 1.4 Stage: Category/Stage III Width: (cm) 1.7 Depth: (cm) 0.3 Volume: (cm) 0.561 Character of Wound/Ulcer Post Improved Debridement: Severity of Tissue Post Fat layer exposed Debridement: Post Procedure Diagnosis Same as Pre-procedure Electronic Signature(s) Signed: 09/15/2016 10:07:30 AM By: Christin Fudge MD, FACS [name] ([id number]) Signed: 09/15/2016 4:59:41 PM By: Montey Hora Entered By: Christin Fudge on 09/15/2016 10:07:29 [name] ([id number]) -------------------------------------------------------------------------------- HPI Details Patient Name: [name] Date of Service: 09/15/2016 9:15 AM Medical Record Number: [id number] Patient Account Number: [id number] Date of Birth/Sex: 01-20-1941 (76 y.o. Male) Treating RN: Montey Hora Primary Care Provider: Enid Derry Other Clinician: Referring Provider: Enid Derry Treating Provider/Extender: Frann Rider in Treatment: 29 History of Present Illness Location: Patient presents with an ulcer on the right lateral ankle. Quality: Patient reports experiencing a dull pain to affected area(s). Severity: Patient states wound are getting worse. Duration: Patient has had the wound for > 2 months prior to seeking treatment at the wound center Timing: Pain in wound is Intermittent (comes and goes Context: The wound appeared gradually over time Modifying Factors: Other treatment(s) tried include:local care as per the medical oncologist Associated Signs and Symptoms: Patient reports having:some pain and no discharge HPI Description: 76 year old patient has been referred by his medical oncologist Dr. Grayland Ormond, for a right lateral malleolus ulcer has been there for several weeks. 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 He is currently on chemotherapy with FOLFOX  and Herceptin. past medical history significant for diabetes mellitus, anemia, hypertension, sleep apnea, status post appendectomy, peripheral vascular catheterization( Port placement) in September 2017 by Dr. Delana Meyer. He is also receiving palliative radiation therapy and was seen by Dr. Donella Stade. 03/17/2016 -- - x-ray of the right ankle -- IMPRESSION: 1. Soft tissue wound noted over the lateral malleolus, no underlying bony abnormality. No acute bony abnormality. 2. Diffuse degenerative change. 3. Peripheral vascular disease. 05/05/2016 -- the patient's wife tells me that  she has noticed a small superficial bruise on the left lateral ankle and wanted me to view this area. 05/12/2016 -- the patient is unable to afford Santyl ointment and we have been struggling over the last 8 weeks to use various alternatives. I have asked them today if they would be willing to use the snap vacuum system and she will let me know soon. 05/19/2016 -- the patient and the wife have declined the use of the snap the vacuum system. He does not want any debridement to be done today. His proteins have improved a bit. 06/08/2016 -- . Not seen the patient back for 3 weeks and in the meanwhile the wife has decided to use duoderm on the wound,-- given to her by a friend. The wound is macerated and has increased in size due to the moisture. 06/16/2016 -- besides the original wound he had on his right lateral malleolus he has a small area on the dorsum of his right foot which is possibly cause due to pressure. He now has a plantar wound on the first metatarsal head on the right foot where his podiatrist Dr. Vickki Muff had debrided a callus yesterday. 08/04/16 on evaluation today patient wounds of the right lower extremity appear to be doing better. He is tolerating the dressing's although there is some maceration on the lateral foot wound. Overall there's no evidence of infection. 08/11/2016 -- he has had his first application  of Grafix to the right lateral ankle. 08/14/2016 -- the patient was brought in by his wife today as he was complaining of pain over his right ankle where the graphics was applied a few days ago. Note is made of the fact that he had had a fall with a slight IMER, FOXWORTH. ([id number]) blunt injury to the ankle. 08/18/16 on evaluation patient has a small new opening over the right lateral ankle compared to last week's evaluation. There's also increased erythema which appears to be consistent with an infection in my opinion. He is also having increased pain. He apparently did have a fall and some of the increased pain could be from the fall although this may also be due to a wound infection. Unfortunately the fall is complicating the situation as far as knowing exactly where the pain is coming from. 08/25/16 on evaluation today patient lateral malleolus wound appears to be doing a little bit better there's not much erythema surrounding the wound at this point. He is still having pain in ankle region. 09/01/2016 -- x-ray of the right ankle -- IMPRESSION:Cannot exclude a subtle nondisplaced fracture of the inferior aspect of the lateral malleolus. RIGHT lateral ulcer without definite findings of cortical destruction. oCulture taken of the wound grew an MSSA which is sensitive to doxycycline and ciprofloxacin the patient does not look as bright as always and seems a little subdued. He says he is feeling fine 09/15/2016 -- the patient is now under hospice care and is at home doing quite poorly Electronic Signature(s) Signed: 09/15/2016 10:08:04 AM By: Christin Fudge MD, FACS Entered By: Christin Fudge on 09/15/2016 10:08:03 [name] ([id number]) -------------------------------------------------------------------------------- Physical Exam Details Patient Name: [name] Date of Service: 09/15/2016 9:15 AM Medical Record Number: [id number] Patient Account Number: [id number] Date of Birth/Sex:  [birth year] (76 y.o. Male) Treating RN: Montey Hora Primary Care Provider: Enid Derry Other Clinician: Referring Provider: Enid Derry Treating Provider/Extender: Frann Rider in Treatment: 27 Constitutional . Pulse regular. Respirations normal and unlabored. Afebrile. . Eyes Nonicteric. Reactive to light. Ears,  Nose, Mouth, and Throat Lips, teeth, and gums WNL.Marland Kitchen Moist mucosa without lesions. Neck supple and nontender. No palpable supraclavicular or cervical adenopathy. Normal sized without goiter. Respiratory WNL. No retractions.. Cardiovascular Pedal Pulses WNL. No clubbing, cyanosis or edema. Lymphatic No adneopathy. No adenopathy. No adenopathy. Musculoskeletal Adexa without tenderness or enlargement.. Digits and nails w/o clubbing, cyanosis, infection, petechiae, ischemia, or inflammatory conditions.. Integumentary (Hair, Skin) No suspicious lesions. No crepitus or fluctuance. No peri-wound warmth or erythema. No masses.Marland Kitchen Psychiatric Judgement and insight Intact.. No evidence of depression, anxiety, or agitation.. Notes the right lateral ankle wound needed sharp debridement of necrotic debris and some capsule and cartilage over the lateral malleolus. this was done sharply with forcep and scissors and minimal bleeding controlled with pressure. The wound on the plantar aspect of his right foot has minimal debris and no sharp debridement was required. Electronic Signature(s) Signed: 09/15/2016 10:10:08 AM By: Christin Fudge MD, FACS Entered By: Christin Fudge on 09/15/2016 10:10:07 [name] ([id number]) -------------------------------------------------------------------------------- Physician Orders Details Patient Name: BOWDEN, BOODY. Date of Service: 09/15/2016 9:15 AM Medical Record Number: [id number] Patient Account Number: [id number] Date of Birth/Sex: 16-May-1940 (76 y.o. Male) Treating RN: Montey Hora Primary Care Provider: Enid Derry Other  Clinician: Referring Provider: Enid Derry Treating Provider/Extender: Frann Rider in Treatment: 62 Verbal / Phone Orders: No Diagnosis Coding Wound Cleansing Wound #1 Right,Lateral Malleolus o Clean wound with Normal Saline. o Cleanse wound with mild soap and water Wound #3 Right,Plantar Foot o Clean wound with Normal Saline. o Cleanse wound with mild soap and water Anesthetic Wound #1 Right,Lateral Malleolus o Topical Lidocaine 4% cream applied to wound bed prior to debridement Wound #3 Right,Plantar Foot o Topical Lidocaine 4% cream applied to wound bed prior to debridement Primary Wound Dressing Wound #1 Right,Lateral Malleolus o Santyl Ointment - IN CLINIC o Medihoney gel - AT HOME Wound #3 Right,Plantar Foot o Santyl Ointment - IN CLINIC o Medihoney gel - AT HOME Secondary Dressing Wound #1 Right,Lateral Malleolus o Boardered Foam Dressing o Drawtex Wound #3 Right,Plantar Foot o Boardered Foam Dressing o Drawtex Dressing Change Frequency Wound #1 Right,Lateral Malleolus SAAMIR, ARMSTRONG ([id number]) o Change dressing every other day. Wound #3 Right,Plantar Foot o Change dressing every other day. Follow-up Appointments Wound #1 Right,Lateral Malleolus o Other: - 3 weeks Wound #3 Right,Plantar Foot o Other: - 3 weeks Edema Control Wound #1 Right,Lateral Malleolus o Elevate legs to the level of the heart and pump ankles as often as possible Wound #3 Right,Plantar Foot o Elevate legs to the level of the heart and pump ankles as often as possible Off-Loading Wound #1 Right,Lateral Malleolus o Other: - Keep pressure off the malleolus Wound #3 Right,Plantar Foot o Other: - Keep pressure off the plantar foot Additional Orders / Instructions Wound #1 Right,Lateral Malleolus o Increase protein intake. o Activity as tolerated Wound #3 Right,Plantar Foot o Increase protein intake. o Activity as  tolerated Home Health Wound #1 [facility] [place] to provide wound care supplies for patient as ordered o Home Health Nurse may visit [facility] to address patientos wound care needs. o FACE TO FACE ENCOUNTER: MEDICARE and MEDICAID PATIENTS: I certify that this patient is under my care and that I had a face-to-face encounter that meets the physician face-to-face encounter requirements with this patient on this date. The encounter with the patient was in whole or in part for the following MEDICAL CONDITION: (primary reason for Home  Healthcare) MEDICAL NECESSITY: I certify, that based on my findings, NURSING services are a medically necessary home health service. HOME BOUND STATUS: I certify that my clinical findings support that this patient is homebound (i.e., Due to illness or injury, pt requires aid of BRENDON, CHRISTOFFEL ([id number]) supportive devices such as crutches, cane, wheelchairs, walkers, the use of special transportation or the assistance of another person to leave their place of residence. There is a normal inability to leave the home and doing so requires considerable and taxing effort. Other absences are for medical reasons / religious services and are infrequent or of short duration when for other reasons). o If current dressing causes regression in wound condition, may D/C ordered dressing product/s and apply Normal Saline Moist Dressing daily until next [place] / Other MD appointment. [place] of regression in wound condition at [phone]. o Please direct any NON-WOUND related issues/requests for orders to patient's Primary Care Physician Wound #3 [facility] [place] to provide wound care supplies for patient as ordered o Home Health Nurse may visit [facility] to address patientos wound care needs. o FACE TO  FACE ENCOUNTER: MEDICARE and MEDICAID PATIENTS: I certify that this patient is under my care and that I had a face-to-face encounter that meets the physician face-to-face encounter requirements with this patient on this date. The encounter with the patient was in whole or in part for the following MEDICAL CONDITION: (primary reason for [place]) MEDICAL NECESSITY: I certify, that based on my findings, NURSING services are a medically necessary home health service. HOME BOUND STATUS: I certify that my clinical findings support that this patient is homebound (i.e., Due to illness or injury, pt requires aid of supportive devices such as crutches, cane, wheelchairs, walkers, the use of special transportation or the assistance of another person to leave their place of residence. There is a normal inability to leave the home and doing so requires considerable and taxing effort. Other absences are for medical reasons / religious services and are infrequent or of short duration when for other reasons). o If current dressing causes regression in wound condition, may D/C ordered dressing product/s and apply Normal Saline Moist Dressing daily until next [place] / Other MD appointment. [place] of regression in wound condition at [phone]. o Please direct any NON-WOUND related issues/requests for orders to patient's Primary Care Physician Electronic Signature(s) Signed: 09/15/2016 4:02:51 PM By: Christin Fudge MD, FACS Signed: 09/15/2016 4:59:41 PM By: Montey Hora Entered By: Montey Hora on 09/15/2016 09:57:21 [name] ([id number]) -------------------------------------------------------------------------------- Problem List Details Patient Name: JUSIAH, AGUAYO. Date of Service: 09/15/2016 9:15 AM Medical Record Number: [id number] Patient Account Number: [id number] Date of Birth/Sex: October 23, 1940 (76 y.o. 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 Date of Service: 09/15/2016 9:15 AM Medical Record Number: [id number] Patient Account Number: [id number] Date of Birth/Sex: 08-Aug-1940 (76 y.o. Male) Treating RN: Montey Hora Primary Care Provider: Enid Derry Other Clinician: Referring Provider: Enid Derry Treating Provider/Extender: Frann Rider in Treatment: 10 Subjective Chief Complaint Information obtained from Patient Patient is at the clinic for treatment of an open pressure ulcer to the right lateral ankle History of Present Illness (HPI) The following HPI elements were documented for the patient's wound: Location: Patient presents with an ulcer on the right lateral ankle. Quality: Patient reports experiencing a dull pain to affected area(s). Severity: Patient states wound are getting worse. Duration: Patient has had the wound for > 2 months prior to seeking treatment at the wound center Timing: Pain in wound is Intermittent (comes and goes Context: The wound appeared gradually over  time Modifying Factors: Other treatment(s) tried include:local care as per the medical oncologist Associated Signs and Symptoms: Patient reports having:some pain and no discharge 76 year old patient has been referred by his medical oncologist Dr. Grayland Ormond, for a right lateral malleolus ulcer has been there for several weeks. 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 He is currently on chemotherapy with FOLFOX and Herceptin. past medical history significant for diabetes mellitus, anemia, hypertension, sleep apnea, status post appendectomy, peripheral vascular catheterization( Port placement) in September 2017 by Dr. Delana Meyer. He is also receiving palliative radiation therapy and was seen by Dr. Donella Stade. 03/17/2016 -- - x-ray of the right ankle -- IMPRESSION: 1. Soft tissue wound noted over the lateral malleolus, no underlying bony abnormality. No acute bony abnormality. 2. Diffuse degenerative change. 3. Peripheral vascular disease. 05/05/2016 -- the patient's wife tells me that she has noticed a small superficial bruise on the left lateral ankle and wanted me to view this area. 05/12/2016 -- the patient is unable to afford Santyl ointment and we have been struggling over the last 8 weeks to use various alternatives. I have asked them today if they would be willing to use the snap vacuum system and she will let me know soon. 05/19/2016 -- the patient and the wife have declined the use of the snap the vacuum system. He does not want any debridement to be done today. His proteins have improved a bit. 06/08/2016 -- . Not seen the patient back for 3 weeks and in the meanwhile the wife has decided to use duoderm on the wound,-- given to her by a friend. The wound is macerated and has increased in size due to the moisture. 06/16/2016 -- besides the original wound he had on his right lateral malleolus he has a small area on the DERRIUS, FURTICK.  ([id number]) dorsum of his right foot which is possibly cause due to pressure. He now has a plantar wound on the first metatarsal head on the right foot where his podiatrist Dr. Vickki Muff had debrided a callus yesterday. 08/04/16 on evaluation today patient wounds of the right lower extremity appear to be doing better. He is tolerating the dressing's although there is some maceration on the lateral foot wound. Overall there's no evidence of infection. 08/11/2016 -- he has had his first application of Grafix to the right lateral ankle. 08/14/2016 -- the patient was brought in by his wife today as he was complaining of pain over his right ankle where the graphics was applied a few days ago. Note is made of the fact  that he had had a fall with a slight blunt injury to the ankle. 08/18/16 on evaluation patient has a small new opening over the right lateral ankle compared to last week's evaluation. There's also increased erythema which appears to be consistent with an infection in my opinion. He is also having increased pain. He apparently did have a fall and some of the increased pain could be from the fall although this may also be due to a wound infection. Unfortunately the fall is complicating the situation as far as knowing exactly where the pain is coming from. 08/25/16 on evaluation today patient lateral malleolus wound appears to be doing a little bit better there's not much erythema surrounding the wound at this point. He is still having pain in ankle region. 09/01/2016 -- x-ray of the right ankle -- IMPRESSION:Cannot exclude a subtle nondisplaced fracture of the inferior aspect of the lateral malleolus. RIGHT lateral ulcer without definite findings of cortical destruction. Culture taken of the wound grew an MSSA which is sensitive to doxycycline and ciprofloxacin the patient does not look as bright as always and seems a little subdued. He says he is feeling fine 09/15/2016 -- the patient is now  under hospice care and is at home doing quite poorly Objective Constitutional Pulse regular. Respirations normal and unlabored. Afebrile. Vitals Time Taken: 9:25 AM, Height: 67 in, Weight: 136 lbs, BMI: 21.3, Temperature: 97.8 F, Pulse: 75 bpm, Respiratory Rate: 16 breaths/min, Blood Pressure: 115/43 mmHg. Eyes Nonicteric. Reactive to light. Ears, Nose, Mouth, and Throat Lips, teeth, and gums WNL.Marland Kitchen Moist mucosa without lesions. Neck BANYAN, GOODCHILD ([id number]) supple and nontender. No palpable supraclavicular or cervical adenopathy. Normal sized without goiter. Respiratory WNL. No retractions.. Cardiovascular Pedal Pulses WNL. No clubbing, cyanosis or edema. Lymphatic No adneopathy. No adenopathy. No adenopathy. Musculoskeletal Adexa without tenderness or enlargement.. Digits and nails w/o clubbing, cyanosis, infection, petechiae, ischemia, or inflammatory conditions.Marland Kitchen Psychiatric Judgement and insight Intact.. No evidence of depression, anxiety, or agitation.. General Notes: the right lateral ankle wound needed sharp debridement of necrotic debris and some capsule and cartilage over the lateral malleolus. this was done sharply with forcep and scissors and minimal bleeding controlled with pressure. The wound on the plantar aspect of his right foot has minimal debris and no sharp debridement was required. Integumentary (Hair, Skin) No suspicious lesions. No crepitus or fluctuance. No peri-wound warmth or erythema. No masses.. Wound #1 status is Open. Original cause of wound was Gradually Appeared. The wound is located on the Right,Lateral Malleolus. The wound measures 1.4cm length x 1.7cm width x 0.2cm depth; 1.869cm^2 area and 0.374cm^3 volume. There is Fat Layer (Subcutaneous Tissue) Exposed exposed. There is no tunneling or undermining noted. There is a large amount of serous drainage noted. The wound margin is flat and intact. There is small (1-33%) pink, pale granulation  within the wound bed. There is a large (67-100%) amount of necrotic tissue within the wound bed including Adherent Slough. The periwound skin appearance exhibited: Induration, Maceration, Erythema. The periwound skin appearance did not exhibit: Dry/Scaly. The surrounding wound skin color is noted with erythema which is circumferential. Periwound temperature was noted as No Abnormality. The periwound has tenderness on palpation. Wound #3 status is Open. Original cause of wound was Pressure Injury. The wound is located on the Spur. The wound measures 1.1cm length x 1.1cm width x 0.1cm depth; 0.95cm^2 area and 0.095cm^3 volume. There is Fat Layer (Subcutaneous Tissue) Exposed exposed. There is no tunneling or undermining noted. There is  a large amount of serous drainage noted. The wound margin is distinct with the outline attached to the wound base. There is large (67-100%) pink, pale granulation within the wound bed. There is a small (1-33%) amount of necrotic tissue within the wound bed including Adherent Slough. The periwound skin appearance exhibited: Maceration. The periwound skin appearance did not exhibit: Callus, Crepitus, Excoriation, Induration, Rash, Scarring, Dry/Scaly, Atrophie Blanche, Cyanosis, Ecchymosis, Hemosiderin Staining, Mottled, Pallor, Rubor, Erythema. Periwound temperature was noted as No Abnormality. The periwound has tenderness on palpation. IMRAAN, WENDELL ([id number]) Assessment Active Problems ICD-10 E11.621 - Type 2 diabetes mellitus with foot ulcer L89.512 - Pressure ulcer of right ankle, stage 2 L97.512 - Non-pressure chronic ulcer of other part of right foot with fat layer exposed E44.1 - Mild protein-calorie malnutrition Procedures Wound #1 Pre-procedure diagnosis of Wound #1 is a Pressure Ulcer located on the Right,Lateral Malleolus .Severity of Tissue Pre Debridement is: Fat layer exposed. There was a Skin/Subcutaneous Tissue  Debridement ([id number]) debridement with total area of 2.38 sq cm performed by Christin Fudge, MD. with the following instrument(s): Forceps and Scissors to remove Viable and Non-Viable tissue/material including Cartilage, Fibrin/Slough, Eschar, and Subcutaneous after achieving pain control using Lidocaine 4% Topical Solution. A time out was conducted at 09:49, prior to the start of the procedure. A Minimum amount of bleeding was controlled with Pressure. The procedure was tolerated well with a pain level of 0 throughout and a pain level of 0 following the procedure. Post Debridement Measurements: 1.4cm length x 1.7cm width x 0.3cm depth; 0.561cm^3 volume. Post debridement Stage noted as Category/Stage III. Character of Wound/Ulcer Post Debridement is improved. Severity of Tissue Post Debridement is: Fat layer exposed. Post procedure Diagnosis Wound #1: Same as Pre-Procedure Plan Wound Cleansing: Wound #1 Right,Lateral Malleolus: Clean wound with Normal Saline. Cleanse wound with mild soap and water Wound #3 Right,Plantar Foot: Clean wound with Normal Saline. Cleanse wound with mild soap and water Anesthetic: Wound #1 Right,Lateral Malleolus: Topical Lidocaine 4% cream applied to wound bed prior to debridement Wound #3 Right,Plantar Foot: JARMAL, LEWELLING ([id number]) Topical Lidocaine 4% cream applied to wound bed prior to debridement Primary Wound Dressing: Wound #1 Right,Lateral Malleolus: Santyl Ointment - IN CLINIC Medihoney gel - AT HOME Wound #3 Right,Plantar Foot: Santyl Ointment - IN CLINIC Medihoney gel - AT HOME Secondary Dressing: Wound #1 Right,Lateral Malleolus: Boardered Foam Dressing Drawtex Wound #3 Right,Plantar Foot: Boardered Foam Dressing Drawtex Dressing Change Frequency: Wound #1 Right,Lateral Malleolus: Change dressing every other day. Wound #3 Right,Plantar Foot: Change dressing every other day. Follow-up Appointments: Wound #1 Right,Lateral  Malleolus: Other: - 3 weeks Wound #3 Right,Plantar Foot: Other: - 3 weeks Edema Control: Wound #1 Right,Lateral Malleolus: Elevate legs to the level of the heart and pump ankles as often as possible Wound #3 Right,Plantar Foot: Elevate legs to the level of the heart and pump ankles as often as possible Off-Loading: Wound #1 Right,Lateral Malleolus: Other: - Keep pressure off the malleolus Wound #3 Right,Plantar Foot: Other: - Keep pressure off the plantar foot Additional Orders / Instructions: Wound #1 Right,Lateral Malleolus: Increase protein intake. Activity as tolerated Wound #3 Right,Plantar Foot: Increase protein intake. Activity as tolerated Home Health: Wound #1 Right,Lateral Malleolus: [facility] [facility] [place] to provide wound care supplies for patient as ordered Home Health Nurse may visit PRN to address patient s wound care needs. FACE TO FACE ENCOUNTER: MEDICARE and MEDICAID PATIENTS: I certify that this patient is under my  care and that I had a face-to-face encounter that meets the physician face-to-face encounter requirements with this patient on this date. The encounter with the patient was in whole or in part for the following MEDICAL CONDITION: (primary reason for [place]) MEDICAL NECESSITY: I certify, HYMEN, ARNETT ([id number]) that based on my findings, NURSING services are a medically necessary home health service. HOME BOUND STATUS: I certify that my clinical findings support that this patient is homebound (i.e., Due to illness or injury, pt requires aid of supportive devices such as crutches, cane, wheelchairs, walkers, the use of special transportation or the assistance of another person to leave their place of residence. There is a normal inability to leave the home and doing so requires considerable and taxing effort. Other absences are for medical reasons / religious services and are infrequent or of short  duration when for other reasons). If current dressing causes regression in wound condition, may D/C ordered dressing product/s and apply Normal Saline Moist Dressing daily until next [place] / Other MD appointment. [place] of regression in wound condition at [phone]. Please direct any NON-WOUND related issues/requests for orders to patient's Primary Care Physician Wound #3 Right,Plantar Foot: [place] [facility] - [place] to provide wound care supplies for patient as ordered Home Health Nurse may visit PRN to address patient s wound care needs. FACE TO FACE ENCOUNTER: MEDICARE and MEDICAID PATIENTS: I certify that this patient is under my care and that I had a face-to-face encounter that meets the physician face-to-face encounter requirements with this patient on this date. The encounter with the patient was in whole or in part for the following MEDICAL CONDITION: (primary reason for [place]) MEDICAL NECESSITY: I certify, that based on my findings, NURSING services are a medically necessary home health service. HOME BOUND STATUS: I certify that my clinical findings support that this patient is homebound (i.e., Due to illness or injury, pt requires aid of supportive devices such as crutches, cane, wheelchairs, walkers, the use of special transportation or the assistance of another person to leave their place of residence. There is a normal inability to leave the home and doing so requires considerable and taxing effort. Other absences are for medical reasons / religious services and are infrequent or of short duration when for other reasons). If current dressing causes regression in wound condition, may D/C ordered dressing product/s and apply Normal Saline Moist Dressing daily until next [place] / Other MD appointment. [place] of regression in wound condition at [phone]. Please  direct any NON-WOUND related issues/requests for orders to patient's Primary Care Physician after a thorough review today and discussing the palliative care under his hospice group, I have recommended: 1. Medihoney to both his wounds to be changed every other day 2. elevation to reduce his lymphedema 3. Encouraged him to have plenty of fluids, proteins and his vitamins 4. Review next weeks we will send his orders to his hospice nurses and if they have any questions they can call us back. He will be seen here as often as required. Electronic Signature(s) Signed: 09/15/2016 10:13:21 AM By: Christin Fudge MD, FACS Entered By: Christin Fudge on 09/15/2016 10:13:21 AVRAM, DANIELSON ([id number], AMINI ([id number]) -------------------------------------------------------------------------------- SuperBill Details Patient Name: HAYDIN, CALANDRA. Date of Service: 09/15/2016 Medical Record Number: [id number] Patient Account Number: [id number] Date of Birth/Sex: 08-25-1940 (76 y.o. Male) Treating RN: Montey Hora Primary Care Provider: Enid Derry  Other Clinician: Referring Provider: Enid Derry Treating Provider/Extender: Frann Rider in Treatment: 27 Diagnosis Coding ICD-10 Codes Code Description E11.621 Type 2 diabetes mellitus with foot ulcer L89.512 Pressure ulcer of right ankle, stage 2 L97.512 Non-pressure chronic ulcer of other part of right foot with fat layer exposed 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 Facility Procedures CPT4 Code Description: [id number] - DEB SUBQ TISSUE 20 SQ CM/< ICD-10 Description Diagnosis E11.621 Type 2 diabetes mellitus with foot ulcer L89.512 Pressure ulcer of right ankle, stage 2 L97.512 Non-pressure chronic ulcer of other part of right fo  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 Modifier: ot with fat la Quantity: 1 yer exposed Physician Procedures CPT4 Code Description: [id number] - WC PHYS SUBQ TISS 20 SQ CM ICD-10 Description  Diagnosis E11.621 Type 2 diabetes mellitus with foot ulcer L89.512 Pressure ulcer of right ankle, stage 2 L97.512 Non-pressure chronic ulcer of other part of right fo  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 Modifier: ot with fat lay Quantity: 1 er exposed Electronic Signature(s) Signed: 09/15/2016 10:13:36 AM By: Christin Fudge MD, FACS Entered By: Christin Fudge on 09/15/2016 10:13:35

## 2016-09-19 DIAGNOSIS — R1310 Dysphagia, unspecified: Secondary | ICD-10-CM | POA: Diagnosis not present

## 2016-09-19 DIAGNOSIS — C155 Malignant neoplasm of lower third of esophagus: Secondary | ICD-10-CM | POA: Diagnosis not present

## 2016-09-19 DIAGNOSIS — D509 Iron deficiency anemia, unspecified: Secondary | ICD-10-CM | POA: Diagnosis not present

## 2016-09-19 DIAGNOSIS — F0390 Unspecified dementia without behavioral disturbance: Secondary | ICD-10-CM | POA: Diagnosis not present

## 2016-09-19 DIAGNOSIS — R634 Abnormal weight loss: Secondary | ICD-10-CM | POA: Diagnosis not present

## 2016-09-19 DIAGNOSIS — C787 Secondary malignant neoplasm of liver and intrahepatic bile duct: Secondary | ICD-10-CM | POA: Diagnosis not present

## 2016-09-19 NOTE — Telephone Encounter (Signed)
I can re-evaluate next week if the wife still wants me too.

## 2016-09-20 DIAGNOSIS — F0390 Unspecified dementia without behavioral disturbance: Secondary | ICD-10-CM | POA: Diagnosis not present

## 2016-09-20 DIAGNOSIS — R634 Abnormal weight loss: Secondary | ICD-10-CM | POA: Diagnosis not present

## 2016-09-20 DIAGNOSIS — C787 Secondary malignant neoplasm of liver and intrahepatic bile duct: Secondary | ICD-10-CM | POA: Diagnosis not present

## 2016-09-20 DIAGNOSIS — R1310 Dysphagia, unspecified: Secondary | ICD-10-CM | POA: Diagnosis not present

## 2016-09-20 DIAGNOSIS — D509 Iron deficiency anemia, unspecified: Secondary | ICD-10-CM | POA: Diagnosis not present

## 2016-09-20 DIAGNOSIS — C155 Malignant neoplasm of lower third of esophagus: Secondary | ICD-10-CM | POA: Diagnosis not present

## 2016-09-21 DIAGNOSIS — C155 Malignant neoplasm of lower third of esophagus: Secondary | ICD-10-CM | POA: Diagnosis not present

## 2016-09-21 DIAGNOSIS — D509 Iron deficiency anemia, unspecified: Secondary | ICD-10-CM | POA: Diagnosis not present

## 2016-09-21 DIAGNOSIS — R634 Abnormal weight loss: Secondary | ICD-10-CM | POA: Diagnosis not present

## 2016-09-21 DIAGNOSIS — R1310 Dysphagia, unspecified: Secondary | ICD-10-CM | POA: Diagnosis not present

## 2016-09-21 DIAGNOSIS — F0390 Unspecified dementia without behavioral disturbance: Secondary | ICD-10-CM | POA: Diagnosis not present

## 2016-09-21 DIAGNOSIS — C787 Secondary malignant neoplasm of liver and intrahepatic bile duct: Secondary | ICD-10-CM | POA: Diagnosis not present

## 2016-09-25 NOTE — Telephone Encounter (Signed)
Has patient been scheduled for appointment?

## 2016-09-26 DIAGNOSIS — C787 Secondary malignant neoplasm of liver and intrahepatic bile duct: Secondary | ICD-10-CM | POA: Diagnosis not present

## 2016-09-26 DIAGNOSIS — R634 Abnormal weight loss: Secondary | ICD-10-CM | POA: Diagnosis not present

## 2016-09-26 DIAGNOSIS — R1310 Dysphagia, unspecified: Secondary | ICD-10-CM | POA: Diagnosis not present

## 2016-09-26 DIAGNOSIS — D509 Iron deficiency anemia, unspecified: Secondary | ICD-10-CM | POA: Diagnosis not present

## 2016-09-26 DIAGNOSIS — F0390 Unspecified dementia without behavioral disturbance: Secondary | ICD-10-CM | POA: Diagnosis not present

## 2016-09-26 DIAGNOSIS — C155 Malignant neoplasm of lower third of esophagus: Secondary | ICD-10-CM | POA: Diagnosis not present

## 2016-09-27 DIAGNOSIS — C155 Malignant neoplasm of lower third of esophagus: Secondary | ICD-10-CM | POA: Diagnosis not present

## 2016-09-27 DIAGNOSIS — R1310 Dysphagia, unspecified: Secondary | ICD-10-CM | POA: Diagnosis not present

## 2016-09-27 DIAGNOSIS — C787 Secondary malignant neoplasm of liver and intrahepatic bile duct: Secondary | ICD-10-CM | POA: Diagnosis not present

## 2016-09-27 DIAGNOSIS — R634 Abnormal weight loss: Secondary | ICD-10-CM | POA: Diagnosis not present

## 2016-09-27 DIAGNOSIS — F0390 Unspecified dementia without behavioral disturbance: Secondary | ICD-10-CM | POA: Diagnosis not present

## 2016-09-27 DIAGNOSIS — D509 Iron deficiency anemia, unspecified: Secondary | ICD-10-CM | POA: Diagnosis not present

## 2016-09-28 DIAGNOSIS — D509 Iron deficiency anemia, unspecified: Secondary | ICD-10-CM | POA: Diagnosis not present

## 2016-09-28 DIAGNOSIS — R1310 Dysphagia, unspecified: Secondary | ICD-10-CM | POA: Diagnosis not present

## 2016-09-28 DIAGNOSIS — C155 Malignant neoplasm of lower third of esophagus: Secondary | ICD-10-CM | POA: Diagnosis not present

## 2016-09-28 DIAGNOSIS — R634 Abnormal weight loss: Secondary | ICD-10-CM | POA: Diagnosis not present

## 2016-09-28 DIAGNOSIS — C787 Secondary malignant neoplasm of liver and intrahepatic bile duct: Secondary | ICD-10-CM | POA: Diagnosis not present

## 2016-09-28 DIAGNOSIS — F0390 Unspecified dementia without behavioral disturbance: Secondary | ICD-10-CM | POA: Diagnosis not present

## 2016-10-04 DIAGNOSIS — R1310 Dysphagia, unspecified: Secondary | ICD-10-CM | POA: Diagnosis not present

## 2016-10-04 DIAGNOSIS — D509 Iron deficiency anemia, unspecified: Secondary | ICD-10-CM | POA: Diagnosis not present

## 2016-10-04 DIAGNOSIS — C155 Malignant neoplasm of lower third of esophagus: Secondary | ICD-10-CM | POA: Diagnosis not present

## 2016-10-04 DIAGNOSIS — F0390 Unspecified dementia without behavioral disturbance: Secondary | ICD-10-CM | POA: Diagnosis not present

## 2016-10-04 DIAGNOSIS — R634 Abnormal weight loss: Secondary | ICD-10-CM | POA: Diagnosis not present

## 2016-10-04 DIAGNOSIS — C787 Secondary malignant neoplasm of liver and intrahepatic bile duct: Secondary | ICD-10-CM | POA: Diagnosis not present

## 2016-10-05 DIAGNOSIS — C155 Malignant neoplasm of lower third of esophagus: Secondary | ICD-10-CM | POA: Diagnosis not present

## 2016-10-05 DIAGNOSIS — R634 Abnormal weight loss: Secondary | ICD-10-CM | POA: Diagnosis not present

## 2016-10-05 DIAGNOSIS — D509 Iron deficiency anemia, unspecified: Secondary | ICD-10-CM | POA: Diagnosis not present

## 2016-10-05 DIAGNOSIS — C787 Secondary malignant neoplasm of liver and intrahepatic bile duct: Secondary | ICD-10-CM | POA: Diagnosis not present

## 2016-10-05 DIAGNOSIS — R1310 Dysphagia, unspecified: Secondary | ICD-10-CM | POA: Diagnosis not present

## 2016-10-05 DIAGNOSIS — F0390 Unspecified dementia without behavioral disturbance: Secondary | ICD-10-CM | POA: Diagnosis not present

## 2016-10-06 ENCOUNTER — Ambulatory Visit: Admitting: Surgery

## 2016-10-06 DIAGNOSIS — R1310 Dysphagia, unspecified: Secondary | ICD-10-CM | POA: Diagnosis not present

## 2016-10-06 DIAGNOSIS — F0390 Unspecified dementia without behavioral disturbance: Secondary | ICD-10-CM | POA: Diagnosis not present

## 2016-10-06 DIAGNOSIS — C155 Malignant neoplasm of lower third of esophagus: Secondary | ICD-10-CM | POA: Diagnosis not present

## 2016-10-06 DIAGNOSIS — D509 Iron deficiency anemia, unspecified: Secondary | ICD-10-CM | POA: Diagnosis not present

## 2016-10-06 DIAGNOSIS — R634 Abnormal weight loss: Secondary | ICD-10-CM | POA: Diagnosis not present

## 2016-10-06 DIAGNOSIS — C787 Secondary malignant neoplasm of liver and intrahepatic bile duct: Secondary | ICD-10-CM | POA: Diagnosis not present

## 2016-10-07 DIAGNOSIS — C787 Secondary malignant neoplasm of liver and intrahepatic bile duct: Secondary | ICD-10-CM | POA: Diagnosis not present

## 2016-10-07 DIAGNOSIS — C155 Malignant neoplasm of lower third of esophagus: Secondary | ICD-10-CM | POA: Diagnosis not present

## 2016-10-07 DIAGNOSIS — R1310 Dysphagia, unspecified: Secondary | ICD-10-CM | POA: Diagnosis not present

## 2016-10-07 DIAGNOSIS — R634 Abnormal weight loss: Secondary | ICD-10-CM | POA: Diagnosis not present

## 2016-10-07 DIAGNOSIS — F0390 Unspecified dementia without behavioral disturbance: Secondary | ICD-10-CM | POA: Diagnosis not present

## 2016-10-07 DIAGNOSIS — D509 Iron deficiency anemia, unspecified: Secondary | ICD-10-CM | POA: Diagnosis not present

## 2016-10-09 DIAGNOSIS — Z7401 Bed confinement status: Secondary | ICD-10-CM | POA: Diagnosis not present

## 2016-10-09 DIAGNOSIS — R6889 Other general symptoms and signs: Secondary | ICD-10-CM | POA: Diagnosis not present

## 2016-10-09 DIAGNOSIS — D509 Iron deficiency anemia, unspecified: Secondary | ICD-10-CM | POA: Diagnosis not present

## 2016-10-09 DIAGNOSIS — C787 Secondary malignant neoplasm of liver and intrahepatic bile duct: Secondary | ICD-10-CM | POA: Diagnosis not present

## 2016-10-09 DIAGNOSIS — C155 Malignant neoplasm of lower third of esophagus: Secondary | ICD-10-CM | POA: Diagnosis not present

## 2016-10-09 DIAGNOSIS — R1310 Dysphagia, unspecified: Secondary | ICD-10-CM | POA: Diagnosis not present

## 2016-10-09 DIAGNOSIS — R634 Abnormal weight loss: Secondary | ICD-10-CM | POA: Diagnosis not present

## 2016-10-09 DIAGNOSIS — F0390 Unspecified dementia without behavioral disturbance: Secondary | ICD-10-CM | POA: Diagnosis not present

## 2016-10-10 DIAGNOSIS — R634 Abnormal weight loss: Secondary | ICD-10-CM | POA: Diagnosis not present

## 2016-10-10 DIAGNOSIS — C787 Secondary malignant neoplasm of liver and intrahepatic bile duct: Secondary | ICD-10-CM | POA: Diagnosis not present

## 2016-10-10 DIAGNOSIS — D509 Iron deficiency anemia, unspecified: Secondary | ICD-10-CM | POA: Diagnosis not present

## 2016-10-10 DIAGNOSIS — R1310 Dysphagia, unspecified: Secondary | ICD-10-CM | POA: Diagnosis not present

## 2016-10-10 DIAGNOSIS — F0390 Unspecified dementia without behavioral disturbance: Secondary | ICD-10-CM | POA: Diagnosis not present

## 2016-10-10 DIAGNOSIS — C155 Malignant neoplasm of lower third of esophagus: Secondary | ICD-10-CM | POA: Diagnosis not present

## 2016-10-11 DIAGNOSIS — C787 Secondary malignant neoplasm of liver and intrahepatic bile duct: Secondary | ICD-10-CM | POA: Diagnosis not present

## 2016-10-11 DIAGNOSIS — C155 Malignant neoplasm of lower third of esophagus: Secondary | ICD-10-CM | POA: Diagnosis not present

## 2016-10-11 DIAGNOSIS — R1310 Dysphagia, unspecified: Secondary | ICD-10-CM | POA: Diagnosis not present

## 2016-10-11 DIAGNOSIS — D509 Iron deficiency anemia, unspecified: Secondary | ICD-10-CM | POA: Diagnosis not present

## 2016-10-11 DIAGNOSIS — F0390 Unspecified dementia without behavioral disturbance: Secondary | ICD-10-CM | POA: Diagnosis not present

## 2016-10-11 DIAGNOSIS — R634 Abnormal weight loss: Secondary | ICD-10-CM | POA: Diagnosis not present

## 2016-10-12 DIAGNOSIS — F0390 Unspecified dementia without behavioral disturbance: Secondary | ICD-10-CM | POA: Diagnosis not present

## 2016-10-12 DIAGNOSIS — R634 Abnormal weight loss: Secondary | ICD-10-CM | POA: Diagnosis not present

## 2016-10-12 DIAGNOSIS — D509 Iron deficiency anemia, unspecified: Secondary | ICD-10-CM | POA: Diagnosis not present

## 2016-10-12 DIAGNOSIS — C155 Malignant neoplasm of lower third of esophagus: Secondary | ICD-10-CM | POA: Diagnosis not present

## 2016-10-12 DIAGNOSIS — C787 Secondary malignant neoplasm of liver and intrahepatic bile duct: Secondary | ICD-10-CM | POA: Diagnosis not present

## 2016-10-12 DIAGNOSIS — R1310 Dysphagia, unspecified: Secondary | ICD-10-CM | POA: Diagnosis not present

## 2016-10-13 ENCOUNTER — Telehealth: Payer: Self-pay | Admitting: *Deleted

## 2016-10-13 DIAGNOSIS — R1310 Dysphagia, unspecified: Secondary | ICD-10-CM | POA: Diagnosis not present

## 2016-10-13 DIAGNOSIS — C155 Malignant neoplasm of lower third of esophagus: Secondary | ICD-10-CM | POA: Diagnosis not present

## 2016-10-13 DIAGNOSIS — C787 Secondary malignant neoplasm of liver and intrahepatic bile duct: Secondary | ICD-10-CM | POA: Diagnosis not present

## 2016-10-13 DIAGNOSIS — R634 Abnormal weight loss: Secondary | ICD-10-CM | POA: Diagnosis not present

## 2016-10-13 DIAGNOSIS — D509 Iron deficiency anemia, unspecified: Secondary | ICD-10-CM | POA: Diagnosis not present

## 2016-10-13 DIAGNOSIS — F0390 Unspecified dementia without behavioral disturbance: Secondary | ICD-10-CM | POA: Diagnosis not present

## 2016-10-14 NOTE — Telephone Encounter (Signed)
[facility]osp[facility] called to report that patient expired at 2:35 PM today

## 2016-10-14 DEATH — deceased

## 2016-11-01 ENCOUNTER — Telehealth: Payer: Self-pay | Admitting: Family Medicine

## 2016-11-01 NOTE — Telephone Encounter (Signed)
I called and spoke with patient's wife He went so peacefully she says, at Hospice I offered condolences

## 2016-11-23 ENCOUNTER — Ambulatory Visit: Payer: Medicare Other | Admitting: Radiation Oncology

## 2016-11-24 ENCOUNTER — Other Ambulatory Visit: Payer: Self-pay | Admitting: Nurse Practitioner

## 2018-08-31 IMAGING — PT NM PET TUM IMG RESTAG (PS) SKULL BASE T - THIGH
1 of 10 series · 1 of 25 positions shown · non-contrast
Comparison: PET-CT 03/23/2016, 02/02/2016

CLINICAL DATA: Subsequent treatment strategy for esophageal
carcinoma..

EXAM:
NUCLEAR MEDICINE PET SKULL BASE TO THIGH
TECHNIQUE: 12.6 mCi F-18 FDG was injected intravenously. Full-ring PET imaging
was performed from the skull base to thigh after the radiotracer. CT
data was obtained and used for attenuation correction and anatomic
localization.
FASTING BLOOD GLUCOSE:  Value: 97 mg/dl

[Series 3: ct wb 5.0 b30f · axial · 5.0mm · 0.98mm/px · 1 of 290 slices shown]
[im 290/290  brain]
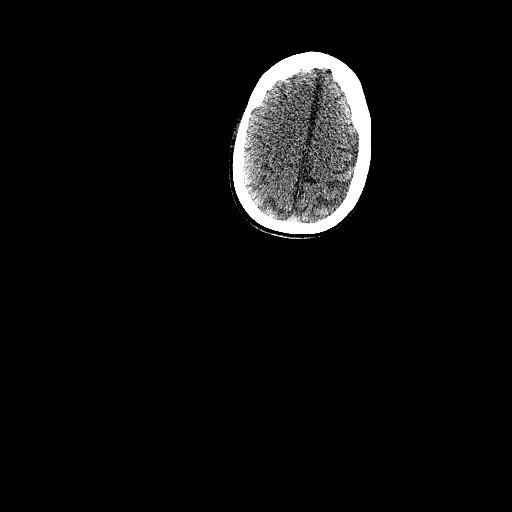

[1 of 25 positions shown; findings below may reference images not displayed]

FINDINGS: NECK

Focal activity in the muscle anterior to the LEFT thyroid cartilage
is new from prior.

CHEST

Upper lobe pulmonary nodules mildly increased. For example 10 mm
nodule (image 83, series 3) in the RIGHT upper lobe is increased
from 5 mm. Peripheral nodule in the RIGHT upper lobe measuring 11 mm
(image 86, series 3) is increased from 8 mm. Several nodules have
low metabolic activity.

New hypermetabolic AP window mediastinal lymph node.

There is diffuse activity through the distal esophagus similar
prior.

ABDOMEN/PELVIS

Metastatic lesion in the subcapsular RIGHT hepatic lobe with SUV max
6.3 compared to 6.2 for no change.

Hypermetabolic partially calcified periportal lymph node is
increased in metabolic activity with SUV max 12.2 increased from
7.3. Cluster of lymph nodes around the celiac trunk are not changed
in metabolic activity (SUV max equals 7.3).

Peritoneal metastasis in the LEFT inguinal canal with SUV max equals
7.9 similar 6.7.

SKELETON

Recurrence of skeletal metastasis in the tip of the RIGHT scapula
with enlarged partially lytic lesion with SUV max equal 15.2.

Evidence of metastatic lesion in the LEFT sacral ala with SUV max
equal 5.4.

New soft tissue metastasis in the LEFT piriformis muscle with SUV
max equal 5.3. Persistent large lesion in the RIGHT adductor muscle
with SUV max equal 15.0 increased from 9.5.
IMPRESSION: 1. Mild to moderate progression / recurrence of metastatic
esophageal carcinoma.
2. Recurrence of skeletal metastasis in the RIGHT scapula and new
lesion in the LEFT super ala.
3. Interval increase in size and mild metabolic activity of upper
lobe pulmonary nodules.
4. Stable activity through the distal esophagus.
5. Stable RIGHT hepatic lobe metastasis.
6. Increase in metabolic activity periportal adenopathy. Stable
celiac adenopathy.
7. Mild increase in activity of peritoneal metastasis in the LEFT
inguinal canal.
8. Increase soft tissue muscle metastasis with increased activity of
the RIGHT adductor lesion and potential new lesion LEFT piriformis
muscle and muscle anterior to the LEFT thyroid cartilage.
